# Patient Record
Sex: Female | Born: 1950 | Hispanic: No | Smoking: Never smoker
Health system: Southern US, Community
[De-identification: ages and names within clinical notes are randomized; demographics above are authoritative.]

## PROBLEM LIST (undated history)

## (undated) DIAGNOSIS — I639 Cerebral infarction, unspecified: Secondary | ICD-10-CM

## (undated) DIAGNOSIS — J45909 Unspecified asthma, uncomplicated: Secondary | ICD-10-CM

## (undated) DIAGNOSIS — J189 Pneumonia, unspecified organism: Secondary | ICD-10-CM

## (undated) DIAGNOSIS — Z992 Dependence on renal dialysis: Secondary | ICD-10-CM

## (undated) DIAGNOSIS — N189 Chronic kidney disease, unspecified: Secondary | ICD-10-CM

## (undated) DIAGNOSIS — R002 Palpitations: Secondary | ICD-10-CM

## (undated) DIAGNOSIS — E119 Type 2 diabetes mellitus without complications: Secondary | ICD-10-CM

## (undated) DIAGNOSIS — I10 Essential (primary) hypertension: Secondary | ICD-10-CM

## (undated) DIAGNOSIS — N289 Disorder of kidney and ureter, unspecified: Secondary | ICD-10-CM

## (undated) DIAGNOSIS — E7800 Pure hypercholesterolemia, unspecified: Secondary | ICD-10-CM

## (undated) DIAGNOSIS — R262 Difficulty in walking, not elsewhere classified: Secondary | ICD-10-CM

## (undated) DIAGNOSIS — K219 Gastro-esophageal reflux disease without esophagitis: Secondary | ICD-10-CM

## (undated) DIAGNOSIS — K5900 Constipation, unspecified: Secondary | ICD-10-CM

## (undated) DIAGNOSIS — I252 Old myocardial infarction: Secondary | ICD-10-CM

## (undated) DIAGNOSIS — R0602 Shortness of breath: Secondary | ICD-10-CM

## (undated) DIAGNOSIS — H269 Unspecified cataract: Secondary | ICD-10-CM

## (undated) DIAGNOSIS — R7303 Prediabetes: Secondary | ICD-10-CM

## (undated) HISTORY — DX: Prediabetes: R73.03

## (undated) HISTORY — DX: Unspecified asthma, uncomplicated: J45.909

## (undated) HISTORY — DX: Dependence on renal dialysis: Z99.2

## (undated) HISTORY — DX: Chronic kidney disease, unspecified: N18.9

## (undated) HISTORY — DX: Essential (primary) hypertension: I10

## (undated) HISTORY — DX: Palpitations: R00.2

## (undated) HISTORY — PX: AV FISTULA PLACEMENT: SHX1204

## (undated) HISTORY — PX: OTHER SURGICAL HISTORY: SHX169

## (undated) HISTORY — DX: Pneumonia, unspecified organism: J18.9

---

## 1994-02-16 ENCOUNTER — Ambulatory Visit: Admit: 1994-02-16 | Disposition: A | Discharge status: Home / Self Care | Payer: Self-pay | Admitting: Family Medicine

## 1995-03-29 ENCOUNTER — Ambulatory Visit: Admit: 1995-03-29 | Disposition: A | Discharge status: Home / Self Care | Payer: Self-pay | Admitting: Family Medicine

## 1995-10-17 ENCOUNTER — Ambulatory Visit: Admit: 1995-10-17 | Disposition: A | Discharge status: Home / Self Care | Payer: Self-pay | Admitting: Internal Medicine

## 1995-10-25 ENCOUNTER — Ambulatory Visit: Admit: 1995-10-25 | Disposition: A | Discharge status: Home / Self Care | Payer: Self-pay | Admitting: Internal Medicine

## 1996-01-24 ENCOUNTER — Ambulatory Visit: Admit: 1996-01-24 | Disposition: A | Discharge status: Home / Self Care | Payer: Self-pay | Admitting: Family Medicine

## 2003-03-16 ENCOUNTER — Ambulatory Visit
Admission: RE | Admit: 2003-03-16 | Disposition: A | Discharge status: Home / Self Care | Payer: Self-pay | Source: Ambulatory Visit | Admitting: Gastroenterology

## 2003-12-13 ENCOUNTER — Emergency Department
Admission: EM | Admit: 2003-12-13 | Disposition: A | Discharge status: Home / Self Care | Payer: Self-pay | Source: Emergency Department | Admitting: Internal Medicine

## 2005-01-20 ENCOUNTER — Ambulatory Visit (INDEPENDENT_AMBULATORY_CARE_PROVIDER_SITE_OTHER): Admit: 2005-01-20 | Disposition: A | Discharge status: Home / Self Care | Payer: Self-pay | Source: Ambulatory Visit

## 2012-09-04 DIAGNOSIS — I639 Cerebral infarction, unspecified: Secondary | ICD-10-CM

## 2012-09-04 HISTORY — DX: Cerebral infarction, unspecified: I63.9

## 2013-03-06 ENCOUNTER — Emergency Department: Payer: Self-pay

## 2013-03-06 ENCOUNTER — Emergency Department
Admission: EM | Admit: 2013-03-06 | Discharge: 2013-03-06 | Disposition: A | Discharge status: Home / Self Care | Payer: Charity | Attending: Emergency Medicine | Admitting: Emergency Medicine

## 2013-03-06 DIAGNOSIS — E119 Type 2 diabetes mellitus without complications: Secondary | ICD-10-CM | POA: Insufficient documentation

## 2013-03-06 DIAGNOSIS — Z91199 Patient's noncompliance with other medical treatment and regimen due to unspecified reason: Secondary | ICD-10-CM | POA: Insufficient documentation

## 2013-03-06 HISTORY — DX: Type 2 diabetes mellitus without complications: E11.9

## 2013-03-06 LAB — CBC AND DIFFERENTIAL
Basophils Absolute Automated: 0.03 (ref 0.00–0.20)
Basophils Automated: 0 %
Eosinophils Absolute Automated: 0.14 (ref 0.00–0.70)
Eosinophils Automated: 2 %
Hematocrit: 38.9 % (ref 37.0–47.0)
Hgb: 12.7 g/dL (ref 12.0–16.0)
Immature Granulocytes Absolute: 0.01
Immature Granulocytes: 0 %
Lymphocytes Absolute Automated: 2.71 (ref 0.50–4.40)
Lymphocytes Automated: 40 %
MCH: 21.7 pg — ABNORMAL LOW (ref 28.0–32.0)
MCHC: 32.6 g/dL (ref 32.0–36.0)
MCV: 66.5 fL — ABNORMAL LOW (ref 80.0–100.0)
MPV: 11.4 fL (ref 9.4–12.3)
Monocytes Absolute Automated: 0.42 (ref 0.00–1.20)
Monocytes: 6 %
Neutrophils Absolute: 3.39 (ref 1.80–8.10)
Neutrophils: 51 %
Platelets: 193 (ref 140–400)
RBC: 5.85 — ABNORMAL HIGH (ref 4.20–5.40)
RDW: 13 % (ref 12–15)
WBC: 6.69 (ref 3.50–10.80)

## 2013-03-06 LAB — URINALYSIS
Bilirubin, UA: NEGATIVE
Blood, UA: NEGATIVE
Glucose, UA: >=1000 — AB
Ketones UA: NEGATIVE
Leukocyte Esterase, UA: NEGATIVE
Nitrite, UA: NEGATIVE
Specific Gravity UA: >1.035 (ref 1.001–1.035)
Urine pH: 6.5 (ref 5.0–8.0)
Urobilinogen, UA: 0.2 mg/dL (ref 0.2–2.0)

## 2013-03-06 LAB — COMPREHENSIVE METABOLIC PANEL
ALT: 28 U/L (ref 0–55)
AST (SGOT): 23 U/L (ref 5–34)
Albumin/Globulin Ratio: 1.2 (ref 0.9–2.2)
Albumin: 3.9 g/dL (ref 3.5–5.0)
Alkaline Phosphatase: 130 U/L (ref 40–150)
Anion Gap: 7 (ref 5.0–15.0)
BUN: 17.3 mg/dL (ref 7.0–19.0)
Bilirubin, Total: 0.4 mg/dL (ref 0.2–1.2)
CO2: 27 (ref 22–29)
Calcium: 9.4 mg/dL (ref 8.5–10.5)
Chloride: 98 (ref 98–107)
Creatinine: 0.9 mg/dL (ref 0.6–1.0)
Globulin: 3.3 g/dL (ref 2.0–3.6)
Glucose: 467 mg/dL — ABNORMAL HIGH (ref 70–100)
Potassium: 4.1 (ref 3.5–5.1)
Protein, Total: 7.2 g/dL (ref 6.0–8.3)
Sodium: 132 — ABNORMAL LOW (ref 136–145)

## 2013-03-06 LAB — POCT GLUCOSE
Whole Blood Glucose POCT: 411 mg/dL — AB (ref 70–100)
Whole Blood Glucose POCT: 246 mg/dL — AB (ref 70–100)

## 2013-03-06 LAB — CELL MORPHOLOGY
Cell Morphology: ABNORMAL — AB
Platelet Estimate: NORMAL

## 2013-03-06 LAB — URINE MICROSCOPIC

## 2013-03-06 LAB — GFR: EGFR: >60

## 2013-03-06 LAB — CK: Creatine Kinase (CK): 84 U/L (ref 29–168)

## 2013-03-06 LAB — TROPONIN I: Troponin I: 0.01 ng/mL (ref 0.00–0.09)

## 2013-03-06 MED ORDER — METFORMIN HCL 1000 MG PO TABS
1000.0000 mg | ORAL_TABLET | Freq: Two times a day (BID) | ORAL | Status: DC
Start: 2013-03-06 — End: 2016-05-19

## 2013-03-06 MED ORDER — INSULIN REGULAR HUMAN 100 UNIT/ML IJ SOLN
8.0000 [IU] | Freq: Once | INTRAMUSCULAR | Status: AC
Start: 2013-03-06 — End: 2013-03-06
  Administered 2013-03-06: 8 [IU] via INTRAVENOUS
  Filled 2013-03-06: qty 24

## 2013-03-06 MED ORDER — SODIUM CHLORIDE 0.9 % IV BOLUS
1000.0000 mL | Freq: Once | INTRAVENOUS | Status: AC
Start: 2013-03-06 — End: 2013-03-06
  Administered 2013-03-06: 1000 mL via INTRAVENOUS

## 2013-03-06 NOTE — ED Provider Notes (Signed)
Physician/Midlevel provider first contact with patient: 03/06/13 1657         History   No chief complaint on file.    HPI Comments: Hx of NIDDM on metformin, states no recent missed dose of medication.  Saw eye doctor today, has cataracts and will need surgery.  Was sent to PCP because sugar control needs to be addressed.  Patient was then sent here because sugars high.  Patient has had several days of increased thirst, polyuria, no skin infections, no cough, no GI symptoms, no CP/SOB.  Has felt lightheaded.     The history is provided by the patient and a relative.       Past Medical History   Diagnosis Date   . Diabetes mellitus        History reviewed. No pertinent past surgical history.    No family history on file.    Social  History   Substance Use Topics   . Smoking status: Never Smoker    . Smokeless tobacco: Not on file   . Alcohol Use: No       .     No Known Allergies    Current/Home Medications    METFORMIN (GLUCOPHAGE) 500 MG TABLET    Take 500 mg by mouth 2 (two) times daily with meals.        Review of Systems   Constitutional: Negative for fever and chills.   HENT: Negative for neck pain.    Respiratory: Negative for shortness of breath.    Cardiovascular: Negative for chest pain.   Gastrointestinal: Negative for nausea, vomiting, abdominal pain and diarrhea.   Genitourinary: Negative for dysuria.   Musculoskeletal: Negative for back pain.   Skin: Negative for rash.   Neurological: Negative for syncope and headaches.   All other systems reviewed and are negative.        Physical Exam    BP 182/88  Pulse 88  Temp 97.8 F (36.6 C)  Resp 21  Ht 1.511 m  Wt 67.586 kg  BMI 29.60 kg/m2  SpO2 97%    Physical Exam   Nursing note and vitals reviewed.  Constitutional: She is oriented to person, place, and time. She appears well-developed and well-nourished. No distress.   HENT:   Head: Normocephalic and atraumatic.   Eyes: Conjunctivae normal are normal.   Neck: Neck supple. No JVD present.    Cardiovascular: Normal rate, regular rhythm and normal heart sounds.    Pulmonary/Chest: Effort normal and breath sounds normal.   Abdominal: Soft. There is no tenderness.   Musculoskeletal: Normal range of motion. She exhibits no edema and no tenderness.   Neurological: She is alert and oriented to person, place, and time.   Skin: Skin is warm and dry. She is not diaphoretic.   Psychiatric: She has a normal mood and affect. Her behavior is normal.       MDM and ED Course     ED Medication Orders      Start     Status Ordering Provider    03/06/13 1738   insulin regular (HumuLIN,NovoLIN) injection 8 Units   Once      Route: Intravenous  Ordered Dose: 8 Units         Last MAR action:  Given Jeannie Done IV    03/06/13 1701   sodium chloride 0.9 % bolus 1,000 mL   Once      Route: Intravenous  Ordered Dose: 1,000 mL  Last MAR action:  Stopped Jeannie Done IV                 MDM  Number of Diagnoses or Management Options  Hyperglycemia:   Noncompliance with medication regimen:   Diagnosis management comments: Nursing history reviewed    O2 Sat 95-100% on RA, normal    EKG interp by me:  NSR, rate 91, normal STs    Patient has empty bottle of metformin 500 which she is supposed to take 2 tabs BID, states she has been out out med for 2-3days and only taking 500mg  BID.  No clinical evidnece of DKA, infection, MI.  No indication for insulin at this time, patient instructed to take 1000mg  twice daily every day as previously directed and monitor sugars/follow up with primary care.  Stable for outpatient management.         Procedures    Clinical Impression & Disposition     Clinical Impression  Final diagnoses:   Hyperglycemia   Noncompliance with medication regimen        ED Disposition     None           New Prescriptions    METFORMIN (GLUCOPHAGE) 1000 MG TABLET    Take 1 tablet (1,000 mg total) by mouth 2 (two) times daily with meals.               Luvenia Starch, MD  03/08/13 [phone]

## 2013-03-06 NOTE — ED Notes (Addendum)
Sent by PMD for blood sugar <400 and HTN. +dizzines. No distress.PO diabetes and no med for HTN

## 2013-03-06 NOTE — Discharge Instructions (Signed)
Hyperglycemia    During your visit today, your blood sugar was found to be high.    The medical term for high blood sugar is hyperglycemia. This may be a one-time event, but it could mean that you have diabetes. Diabetes is a serious illness and if it is left untreated, it can lead to heart problems, kidney problems (including kidney failure), stroke, or blindness. It is very important that you follow up with your regular doctor for a recheck of your blood sugar.    Tell your regular doctor that your blood sugar was high today. Your doctor will want to recheck your blood and possibly order other lab tests. If your doctor finds that you have diabetes, you will need medication and a special diet to control your blood sugar.    YOU SHOULD SEEK MEDICAL ATTENTION IMMEDIATELY, EITHER HERE OR AT THE NEAREST EMERGENCY DEPARTMENT, IF ANY OF THE FOLLOWING OCCURS:   Confusion or lethargy (very sleepy and hard to wake up).   Signs of dehydration, such as decreased urination, dry mouth, extreme fatigue, lightheadedness, or fainting.   Persistent vomiting.   Fever (temperature of over 100.5 F.) or shaking chills.   Abdominal (belly) pain or vomiting.

## 2013-03-09 ENCOUNTER — Observation Stay: Payer: Charity

## 2013-03-09 ENCOUNTER — Inpatient Hospital Stay: Payer: Charity | Admitting: Internal Medicine

## 2013-03-09 ENCOUNTER — Emergency Department: Payer: Charity

## 2013-03-09 ENCOUNTER — Inpatient Hospital Stay
Admission: EM | Admit: 2013-03-09 | Discharge: 2013-03-13 | DRG: 066 | Disposition: A | Discharge status: Rehabilitation Facility | Payer: Charity | Attending: Medical Oncology | Admitting: Medical Oncology

## 2013-03-09 DIAGNOSIS — IMO0001 Reserved for inherently not codable concepts without codable children: Secondary | ICD-10-CM | POA: Diagnosis present

## 2013-03-09 DIAGNOSIS — Z880 Allergy status to penicillin: Secondary | ICD-10-CM

## 2013-03-09 DIAGNOSIS — E119 Type 2 diabetes mellitus without complications: Secondary | ICD-10-CM

## 2013-03-09 DIAGNOSIS — I10 Essential (primary) hypertension: Secondary | ICD-10-CM

## 2013-03-09 DIAGNOSIS — Z23 Encounter for immunization: Secondary | ICD-10-CM

## 2013-03-09 DIAGNOSIS — E785 Hyperlipidemia, unspecified: Secondary | ICD-10-CM | POA: Diagnosis present

## 2013-03-09 DIAGNOSIS — I6350 Cerebral infarction due to unspecified occlusion or stenosis of unspecified cerebral artery: Principal | ICD-10-CM | POA: Diagnosis present

## 2013-03-09 DIAGNOSIS — E782 Mixed hyperlipidemia: Secondary | ICD-10-CM | POA: Diagnosis present

## 2013-03-09 LAB — URINALYSIS
Bilirubin, UA: NEGATIVE
Blood, UA: NEGATIVE
Glucose, UA: 250 — AB
Ketones UA: NEGATIVE
Nitrite, UA: NEGATIVE
Protein, UR: NEGATIVE
Specific Gravity UA: 1.009 (ref 1.001–1.035)
Urine pH: 6.5 (ref 5.0–8.0)
Urobilinogen, UA: 0.2 mg/dL (ref 0.2–2.0)

## 2013-03-09 LAB — CELL MORPHOLOGY
Cell Morphology: ABNORMAL — AB
Platelet Estimate: NORMAL

## 2013-03-09 LAB — CBC AND DIFFERENTIAL
Basophils Absolute Automated: 0.03 (ref 0.00–0.20)
Basophils Automated: 0 %
Eosinophils Absolute Automated: 0.12 (ref 0.00–0.70)
Eosinophils Automated: 2 %
Hematocrit: 40.8 % (ref 37.0–47.0)
Hgb: 13.2 g/dL (ref 12.0–16.0)
Immature Granulocytes Absolute: 0.01
Immature Granulocytes: 0 %
Lymphocytes Absolute Automated: 2.31 (ref 0.50–4.40)
Lymphocytes Automated: 39 %
MCH: 21.7 pg — ABNORMAL LOW (ref 28.0–32.0)
MCHC: 32.4 g/dL (ref 32.0–36.0)
MCV: 67.2 fL — ABNORMAL LOW (ref 80.0–100.0)
MPV: 11.7 fL (ref 9.4–12.3)
Monocytes Absolute Automated: 0.34 (ref 0.00–1.20)
Monocytes: 6 %
Neutrophils Absolute: 3.08 (ref 1.80–8.10)
Neutrophils: 52 %
Platelets: 200 (ref 140–400)
RBC: 6.07 — ABNORMAL HIGH (ref 4.20–5.40)
RDW: 14 % (ref 12–15)
WBC: 5.88 (ref 3.50–10.80)

## 2013-03-09 LAB — ECG 12-LEAD
Atrial Rate: 91 {beats}/min
P Axis: 40 degrees
P-R Interval: 164 ms
Q-T Interval: 336 ms
QRS Duration: 80 ms
QTC Calculation (Bezet): 413 ms
R Axis: 7 degrees
T Axis: 25 degrees
Ventricular Rate: 91 {beats}/min

## 2013-03-09 LAB — PT/INR
PT INR: 0.9
PT: 12.5 — ABNORMAL LOW (ref 12.6–15.0)

## 2013-03-09 LAB — COMPREHENSIVE METABOLIC PANEL
ALT: 25 U/L (ref 0–55)
AST (SGOT): 24 U/L (ref 5–34)
Albumin/Globulin Ratio: 1.1 (ref 0.9–2.2)
Albumin: 3.8 g/dL (ref 3.5–5.0)
Alkaline Phosphatase: 77 U/L (ref 40–150)
Anion Gap: 11 (ref 5.0–15.0)
BUN: 9.5 mg/dL (ref 7.0–19.0)
Bilirubin, Total: 0.6 mg/dL (ref 0.2–1.2)
CO2: 24 (ref 22–29)
Calcium: 10.1 mg/dL (ref 8.5–10.5)
Chloride: 99 (ref 98–107)
Creatinine: 0.8 mg/dL (ref 0.6–1.0)
Globulin: 3.5 g/dL (ref 2.0–3.6)
Glucose: 322 mg/dL — ABNORMAL HIGH (ref 70–100)
Potassium: 4.3 (ref 3.5–5.1)
Protein, Total: 7.3 g/dL (ref 6.0–8.3)
Sodium: 134 — ABNORMAL LOW (ref 136–145)

## 2013-03-09 LAB — POCT GLUCOSE
Whole Blood Glucose POCT: 293 mg/dL — AB (ref 70–100)
Whole Blood Glucose POCT: 305 mg/dL — AB (ref 70–100)
Whole Blood Glucose POCT: 178 mg/dL — AB (ref 70–100)

## 2013-03-09 LAB — URINE MICROSCOPIC

## 2013-03-09 LAB — TROPONIN I: Troponin I: <0.01 ng/mL (ref 0.00–0.09)

## 2013-03-09 LAB — APTT: PTT: 31 (ref 23–37)

## 2013-03-09 LAB — GFR: EGFR: >60

## 2013-03-09 MED ORDER — ONDANSETRON HCL 4 MG/2ML IJ SOLN
4.00 mg | Freq: Once | INTRAMUSCULAR | Status: DC | PRN
Start: 2013-03-09 — End: 2013-03-09

## 2013-03-09 MED ORDER — ASPIRIN 81 MG PO CHEW
324.0000 mg | CHEWABLE_TABLET | Freq: Every day | ORAL | Status: DC
Start: 2013-03-10 — End: 2013-03-13
  Administered 2013-03-10 – 2013-03-13 (×4): 324 mg via ORAL
  Filled 2013-03-09 (×4): qty 4

## 2013-03-09 MED ORDER — INSULIN GLARGINE 100 UNIT/ML SC SOLN
10.00 [IU] | Freq: Every evening | SUBCUTANEOUS | Status: DC
Start: 2013-03-09 — End: 2013-03-13
  Administered 2013-03-09 – 2013-03-12 (×4): 10 [IU] via SUBCUTANEOUS
  Filled 2013-03-09 (×4): qty 100

## 2013-03-09 MED ORDER — ACETAMINOPHEN 325 MG PO TABS
650.0000 mg | ORAL_TABLET | ORAL | Status: DC | PRN
Start: 2013-03-09 — End: 2013-03-13
  Administered 2013-03-09 – 2013-03-13 (×5): 650 mg via ORAL
  Filled 2013-03-09 (×7): qty 2

## 2013-03-09 MED ORDER — LABETALOL HCL 5 MG/ML IV SOLN
20.00 mg | Freq: Three times a day (TID) | INTRAVENOUS | Status: DC | PRN
Start: 2013-03-09 — End: 2013-03-13

## 2013-03-09 MED ORDER — GLIMEPIRIDE 2 MG PO TABS
4.00 mg | ORAL_TABLET | Freq: Every morning | ORAL | Status: DC
Start: 2013-03-10 — End: 2013-03-13
  Administered 2013-03-10 – 2013-03-13 (×4): 4 mg via ORAL
  Filled 2013-03-09 (×5): qty 2

## 2013-03-09 MED ORDER — SODIUM CHLORIDE 0.9 % IV SOLN
INTRAVENOUS | Status: DC
Start: 2013-03-09 — End: 2013-03-09

## 2013-03-09 MED ORDER — MORPHINE SULFATE 2 MG/ML IJ/IV SOLN (WRAP)
2.00 mg | INTRAVENOUS | Status: DC | PRN
Start: 2013-03-09 — End: 2013-03-09

## 2013-03-09 MED ORDER — ASPIRIN 81 MG PO CHEW
324.0000 mg | CHEWABLE_TABLET | Freq: Once | ORAL | Status: AC
Start: 2013-03-09 — End: 2013-03-09
  Administered 2013-03-09: 324 mg via ORAL
  Filled 2013-03-09: qty 4

## 2013-03-09 MED ORDER — MORPHINE SULFATE 2 MG/ML IJ/IV SOLN (WRAP)
2.0000 mg | Status: DC | PRN
Start: 2013-03-09 — End: 2013-03-13
  Administered 2013-03-09 – 2013-03-12 (×4): 2 mg via INTRAVENOUS
  Filled 2013-03-09 (×4): qty 1

## 2013-03-09 MED ORDER — ACETAMINOPHEN 325 MG PO TABS
650.0000 mg | ORAL_TABLET | ORAL | Status: DC | PRN
Start: 2013-03-09 — End: 2013-03-09

## 2013-03-09 MED ORDER — SITAGLIPTIN PHOSPHATE 50 MG PO TABS
50.0000 mg | ORAL_TABLET | Freq: Every day | ORAL | Status: DC
Start: 2013-03-09 — End: 2013-03-13
  Administered 2013-03-09 – 2013-03-13 (×5): 50 mg via ORAL
  Filled 2013-03-09 (×6): qty 1

## 2013-03-09 MED ORDER — ONDANSETRON HCL 4 MG/2ML IJ SOLN
4.0000 mg | Freq: Once | INTRAMUSCULAR | Status: AC
Start: 2013-03-09 — End: 2013-03-09
  Administered 2013-03-09: 4 mg via INTRAVENOUS
  Filled 2013-03-09: qty 2

## 2013-03-09 MED ORDER — INSULIN REGULAR HUMAN 100 UNIT/ML IJ SOLN
4.0000 [IU] | Freq: Once | INTRAMUSCULAR | Status: DC
Start: 2013-03-09 — End: 2013-03-13
  Filled 2013-03-09: qty 15

## 2013-03-09 MED ORDER — INSULIN ASPART 100 UNIT/ML SC SOLN
3.0000 [IU] | Freq: Three times a day (TID) | SUBCUTANEOUS | Status: DC
Start: 2013-03-09 — End: 2013-03-13
  Administered 2013-03-09: 9 [IU] via SUBCUTANEOUS
  Administered 2013-03-10: 6 [IU] via SUBCUTANEOUS
  Administered 2013-03-11: 3 [IU] via SUBCUTANEOUS
  Administered 2013-03-11: 6 [IU] via SUBCUTANEOUS
  Administered 2013-03-11: 3 [IU] via SUBCUTANEOUS
  Administered 2013-03-12: 6 [IU] via SUBCUTANEOUS
  Administered 2013-03-13: 9 [IU] via SUBCUTANEOUS
  Filled 2013-03-09: qty 60
  Filled 2013-03-09: qty 30
  Filled 2013-03-09: qty 90
  Filled 2013-03-09: qty 30
  Filled 2013-03-09 (×2): qty 10
  Filled 2013-03-09: qty 30
  Filled 2013-03-09 (×2): qty 60

## 2013-03-09 MED ORDER — ONDANSETRON HCL 4 MG/2ML IJ SOLN
4.0000 mg | Freq: Once | INTRAMUSCULAR | Status: DC | PRN
Start: 2013-03-09 — End: 2013-03-13

## 2013-03-09 MED ORDER — PNEUMOCOCCAL VAC POLYVALENT 25 MCG/0.5ML IJ INJ
0.50 mL | INJECTION | INTRAMUSCULAR | Status: AC | PRN
Start: 2013-03-09 — End: 2013-03-12
  Administered 2013-03-12: 0.5 mL via SUBCUTANEOUS
  Filled 2013-03-09: qty 0.5

## 2013-03-09 MED ORDER — METOPROLOL TARTRATE 1 MG/ML IV SOLN
INTRAVENOUS | Status: AC
Start: 2013-03-09 — End: 2013-03-09
  Administered 2013-03-09: 5 mg
  Filled 2013-03-09: qty 15

## 2013-03-09 MED ORDER — GADOBUTROL 1 MMOL/ML IV SOLN
10.00 mL | Freq: Once | INTRAVENOUS | Status: AC | PRN
Start: 2013-03-09 — End: 2013-03-09
  Administered 2013-03-09: 10 mmol via INTRAVENOUS

## 2013-03-09 NOTE — ED Notes (Signed)
ACCUCHEK RESULTED AT 295. DOC PUCCIO INFORMED.

## 2013-03-09 NOTE — ED Provider Notes (Signed)
Physician/Midlevel provider first contact with patient: 03/09/13 1134         History     Chief Complaint   Patient presents with   . Stroke     HPI Comments: Pt was fine yesterday, walking independently, last well at about 11 pm.  Then at about 2 am she began complaining of dizziness and nausea, numbness and weakness of left side.  Family brings her in for evaluation.  Mild headache, no chest pain or shortness of breath.  Feels light headed.  Pt declines interpreter, prefers family to interpret.  Pt is not a tPA candidate due to duration of symptoms since 2 am.    Patient is a 62 y.o. female presenting with Acute Neurological Problem. The history is provided by the patient and a relative.   Cerebrovascular Accident  This is a new problem. The current episode started today (about 2 am). The problem occurs constantly. The problem has been gradually worsening. Associated symptoms include nausea and weakness. Pertinent negatives include no abdominal pain, chest pain, chills, coughing, fever, headaches, neck pain, numbness, rash, sore throat or vomiting.       Past Medical History   Diagnosis Date   . Diabetes mellitus        History reviewed. No pertinent past surgical history.    No family history on file.    Social  History   Substance Use Topics   . Smoking status: Never Smoker    . Smokeless tobacco: Not on file   . Alcohol Use: No       .     No Known Allergies    Current/Home Medications    METFORMIN (GLUCOPHAGE) 1000 MG TABLET    Take 1 tablet (1,000 mg total) by mouth 2 (two) times daily with meals.        Review of Systems   Constitutional: Negative for fever and chills.   HENT: Negative for sore throat, neck pain and neck stiffness.    Eyes: Negative for discharge and redness.   Respiratory: Negative for cough and shortness of breath.    Cardiovascular: Negative for chest pain, palpitations and leg swelling.   Gastrointestinal: Positive for nausea. Negative for vomiting, abdominal pain, diarrhea and  constipation.   Genitourinary: Negative for dysuria and frequency.   Musculoskeletal: Negative for back pain.   Skin: Negative for pallor and rash.   Neurological: Positive for dizziness, speech difficulty, weakness and light-headedness. Negative for tremors, seizures, syncope, facial asymmetry, numbness and headaches.   Psychiatric/Behavioral: Negative for agitation. The patient is nervous/anxious.    All other systems reviewed and are negative.        Physical Exam    BP 171/77  Pulse 75  Temp 97.4 F (36.3 C) (Temporal Artery)  Resp 16  Ht 1.524 m  Wt 64.3 kg  BMI 27.68 kg/m2  SpO2 98%    Physical Exam   Nursing note and vitals reviewed.  Constitutional: She appears well-developed and well-nourished. No distress.   HENT:   Head: Normocephalic and atraumatic.   Right Ear: External ear normal.   Left Ear: External ear normal.   Mouth/Throat: Oropharynx is clear and moist.   Eyes: Conjunctivae normal are normal. Pupils are equal, round, and reactive to light. Right eye exhibits no discharge. Left eye exhibits no discharge. No scleral icterus.   Neck: Normal range of motion. Neck supple. No JVD present. No tracheal deviation present. No thyromegaly present.   Cardiovascular: Normal rate, regular rhythm and normal heart  sounds.  Exam reveals no gallop and no friction rub.    No murmur heard.  Pulmonary/Chest: Effort normal and breath sounds normal. No stridor. No respiratory distress. She has no wheezes. She has no rales.   Abdominal: Soft. Bowel sounds are normal. She exhibits no distension. There is no tenderness. There is no rebound and no guarding.   Musculoskeletal: Normal range of motion. She exhibits no edema and no tenderness.        No Cyanosis or Clubbing   Lymphadenopathy:     She has no cervical adenopathy.   Neurological: She is alert. She has normal strength. No cranial nerve deficit or sensory deficit. She exhibits normal muscle tone. Coordination normal.        Subjective numbness left leg,  NIHSS 1, GCS 15   Skin: Skin is warm and dry. No rash noted. She is not diaphoretic. No erythema. No pallor.   Psychiatric: She has a normal mood and affect. Judgment normal. Her mood appears not anxious. Her speech is not slurred. She is not agitated. Cognition and memory are normal.       MDM and ED Course     ED Medication Orders      Start     Status Ordering Provider    03/09/13 1321   aspirin chewable tablet 324 mg   Once      Route: Oral  Ordered Dose: 324 mg         Last MAR action:  Given Hakim Minniefield V    03/09/13 1147   insulin regular (HumuLIN,NovoLIN) injection 4 Units   Once      Route: Intravenous  Ordered Dose: 4 Units         Last MAR action:  RN Verify Anniston Nellums V    03/09/13 1144   ondansetron (ZOFRAN) injection 4 mg   Once      Route: Intravenous  Ordered Dose: 4 mg         Last MAR action:  Given Quinne Pires V    03/09/13 1141   metoprolol (LOPRESSOR) 1 MG/ML injection      Comments: Created by cabinet override        Last MAR action:  Given Maecy Podgurski V                 MDM  Number of Diagnoses or Management Options  CVA (cerebral vascular accident):   Diagnosis management comments: I, Cleone Slim, MD, have been the primary provider for [name] during this Emergency Dept visit.  Oxygen saturation by pulse oximetry is 95%-100%, Normal.  Interventions: None Needed.  EKG Interpretation:    Rhythm:  Normal Sinus 68  Ectopy:  None  Rate:  Normal  Conduction:  No blocks  ST Segments:  Normal ST segments  T Waves:  Normal T Waves  Axis:  Normal    Q Waves:  None seen  Pacing:  Not applicable  Clinical Impression:  Normal EKG  Blood pressure improved after metoprolol, given insulin for hyperglycemia.  No history of HTN.  Initially failed dysphagia screen, but passed TOR-BSST.  PO aspirin given.  Family understands pt needs to be admitted for further evaluation, pt understands.  Results for orders placed during the hospital encounter of 03/09/13    -CBC AND DIFFERENTIAL     WBC                            5.88  Range: 3.50 - 10.80     RBC                           6.07 (*)Range: 4.20 - 5.40     Hgb g/dL                      13.2    Range: 12.0 - 16.0 g/dL     Hematocrit %                  40.8    Range: 37.0 - 47.0 %     MCV fL                        67.2 (*)Range: 80.0 - 100.0 fL     MCH pg                        21.7 (*)Range: 28.0 - 32.0 pg     MCHC g/dL                     32.4    Range: 32.0 - 36.0 g/dL     RDW %                         14      Range: 12 - 15 %     Platelets                     200     Range: 140 - 400     MPV fL                        11.7    Range: 9.4 - 12.3 fL     Neutrophils %                 52      Range: None %     Lymphocytes Automated %       39      Range: None %     Monocytes %                   6       Range: None %     Eosinophils Automated %       2       Range: None %     Basophils Automated %         0       Range: None %     Immature Granulocyte %        0       Range: None %     Neutrophils Absolute          3.08    Range: 1.80 - 8.10     Abs Lymph Automated           2.31    Range: 0.50 - 4.40     Abs Mono Automated            0.34    Range: 0.00 - 1.20     Abs Eos Automated             0.12    Range: 0.00 - 0.70     Absolute Baso Automated       0.03  Range: 0.00 - 0.20     Absolute Immature Granulocyte   0.01    Range: 0    -COMPREHENSIVE METABOLIC PANEL     Glucose mg/dL                 322 (*) Range: 70 - 100 mg/dL     BUN mg/dL                     9.5     Range: 7.0 - 19.0 mg/dL     Creatinine mg/dL              0.8     Range: 0.6 - 1.0 mg/dL     Sodium                        134 (*) Range: 136 - 145     Potassium                     4.3     Range: 3.5 - 5.1     Chloride                      99      Range: 98 - 107     CO2                           24      Range: 22 - 29     CALCIUM mg/dL                 10.1    Range: 8.5 - 10.5 mg/dL     Protein, Total g/dL           7.3     Range: 6.0 - 8.3 g/dL     Albumin g/dL                  3.8      Range: 3.5 - 5.0 g/dL     AST (SGOT) U/L                24      Range: 5 - 34 U/L     ALT U/L                       25      Range: 0 - 55 U/L     Alkaline Phosphatase U/L      77      Range: 40 - 150 U/L     Bilirubin, Total mg/dL        0.6     Range: 0.2 - 1.2 mg/dL     Globulin g/dL                 3.5     Range: 2.0 - 3.6 g/dL     Albumin/Globulin Ratio        1.1     Range: 0.9 - 2.2     Anion Gap                     11.0    Range: 5.0 - 15.0    -PT/INR     PT                            12.5 (*)  Range: 12.6 - 15.0     PT INR                        0.9     Range: None Estab.     PT Anticoag. Given Within 48 hrs.   None                  -APTT     PTT                           31      Range: 23 - 37    -URINALYSIS     Urine Type                    Clean Catch                Color, UA                     YELLOW  Range: Clear - Yellow     Clarity, UA                   CLEAR   Range: Clear - Hazy     Specific Gravity UA           1.009   Range: 1.001-1.035     Urine pH                      6.5     Range: 5.0-8.0     Leukocyte Esterase, UA        TRACE (*)Range: Negative     Nitrite, UA                   NEGATIVE Range: Negative     Protein, UA                   NEGATIVE Range: Negative     Glucose, UA                   250 (*) Range: Negative     Ketones UA                    NEGATIVE Range: Negative     Urobilinogen, UA mg/dL        0.2     Range: 0.2-2.0 mg/dL     Bilirubin, UA                 NEGATIVE Range: Negative     Blood, UA                     NEGATIVE Range: Negative    -GFR     EGFR                          >60.0                 -CELL MORPHOLOGY     Cell Morphology:              Abnormal (*)               Microcytic                    =2+ (*)  Hypochromia                   =2+ (*)                Polychromasia                 =1+ (*)                Ovalocytes                    =1+ (*)                Platelet Estimate             Normal                -POCT GLUCOSE     POCT Glucose WB mg/dL          293 (*) Range: 70 - 100 mg/dL  Results for orders placed during the hospital encounter of 03/09/13    -XR CHEST AP PORTABLE         Narrative: Clinical history: Stroke.                                    COMPARISON: None.                                    Chest, AP portable: The osseous and soft tissue structures are                  unremarkable. The thoracic aorta is tortuous. The cardiac silhouette is                  at the upper limits of normal in size. The hilar silhouettes are within                  normal limits. The lungs are grossly clear.                           Impression:  No acute process.                                      Nicholes Rough, MD                    03/09/2013 1:07 PM    -CT HEAD WO CONTRAST         Narrative: HISTORY: Blurred vision and left-sided weakness.                                    COMPARISON: None.                                    FINDINGS:     The ventricles are normal in appearance. No acute infarct                  is noted. No intracranial hemorrhage is seen. The paranasal sinuses and  mastoid air cells appear clear. Internal carotid and vertebral artery                  calcifications are visualized.                            Impression:  No acute intracranial abnormality is seen.                                                              Marcos Eke, MD                    03/09/2013 12:02 PM    Discussed with Dr. Dwaine Gale, will admit to observation unit.       Amount and/or Complexity of Data Reviewed  Clinical lab tests: ordered and reviewed  Tests in the radiology section of CPT: ordered and reviewed          Procedures    Clinical Impression & Disposition     Clinical Impression  Final diagnoses:   CVA (cerebral vascular accident)   Hypertensive emergency   Type 2 diabetes mellitus with other manifestations   Dizziness        ED Disposition     Admit Bed Type: Telemetry [5]  Admitting Physician: Art Buff [id number]  Patient Class:  Observation [104]             New Prescriptions    No medications on file               Carlota Raspberry, MD  03/09/13 [phone]

## 2013-03-09 NOTE — ED Notes (Addendum)
Numbness last night @ 3 am / dizziness  blurry vision  Unsteady on feet  No other nuero deficits  GCS-15

## 2013-03-09 NOTE — Progress Notes (Signed)
Admitted from E[facility] via stretcher alert and oriented. DAughter with her to translate. Speaks Laotian. Oriented to room and call light. Informed of plan of care and expected diagnostics eg MRI/MRA, 2D echo and blood works in AM. Verbalized understanding. Passed TOR-BSST screening done at the ER per form/report. Dysphagia screen done, passed as well. Tolerated full CC diet.

## 2013-03-09 NOTE — Progress Notes (Signed)
Per radiologist positive Acute stroke in the pons.and a right vertebral artery being small in caliber.   Being adequately treated for a CVA/ TIA.

## 2013-03-09 NOTE — H&P (Signed)
Medicine Admission H&P    Date Time: 03/09/2013 3:37 PM  Patient Name: [facility]  Attending Physician: Mir, Windle Guard, MD    Chief Complaint:    Dizziness and numbness left side with some weakness.   Balance problems     History of Present Illness:    62 yo lady admitted Hx DM admitted for the evaluation of the Left sided numbness and weakness started overnight. She was recently discharged from ER after treating for uncontrolled DM. Since then patient continues to have ongoing symptoms of dizziness and starting of the numbness of the left side. Associated with balance problems. No c/o chest pain and shortness of breath. Her DM is poorly managed and also she has been untreated HTN.     Past Medical History:      Past Medical History   Diagnosis Date   . Diabetes mellitus        Past Surgical History:    History reviewed. No pertinent past surgical history.    Family History:    No Family Hx of Cardiac or cancer history     Social History:      History     Social History   . Marital Status: Widowed     Spouse Name: N/A     Number of Children: N/A   . Years of Education: N/A     Occupational History   . Not on file.     Social History Main Topics   . Smoking status: Never Smoker    . Smokeless tobacco: Not on file   . Alcohol Use: No   . Drug Use:    . Sexually Active: Not on file     Other Topics Concern   . Not on file     Social History Narrative   . No narrative on file         Allergies:    No Known Allergies     Medications:      Prescriptions prior to admission   Medication Sig   . metFORMIN (GLUCOPHAGE) 1000 MG tablet Take 1 tablet (1,000 mg total) by mouth 2 (two) times daily with meals.       Review of Systems:    Constitutional: Negative for fever, chills and malaise/fatigue.   HENT: Negative for congestion, sore throat, neck pain and tinnitus.   Eyes: Negative for blurred vision and discharge.   Respiratory: Negative for cough, sputum production, shortness of breath and wheezing.   Cardiovascular:  Negative for chest pain, palpitations and leg swelling.   Gastrointestinal: Negative for heartburn, NV, abdominal pain, diarrhea and constipation.   Genitourinary: Negative for dysuria, urgency and frequency.   Musculoskeletal: Negative for myalgias, back pain and falls.   Skin: Negative for rash.   Neurological: ++ dizziness, ++tingling, ++sensory change, focal weakness, seizures, loss of consciousness, weakness and ++headaches.   Endo/Heme/Allergies: Does not bruise/bleed easily.   Psychiatric/Behavioral: Negative for depression, suicidal ideas, memory loss and substance abuse. The patient is not nervous/anxious.     Physical Exam:    BP 171/77  Pulse 75  Temp 97.4 F (36.3 C) (Temporal Artery)  Resp 16  Ht 1.524 m (5')  Wt 64.3 kg (141 lb 12.1 oz)  BMI 27.68 kg/m2  SpO2 98%    General appearance - alert, well appearing, and in no distress   Eyes - Conjunctiva normal, pupils equal and reactive, extraocular eye movements intact, good peripheral vision+  Mouth - mucous membranes moist, pharynx normal without lesions  Chest - clear to auscultation, no wheezes, rales or rhonchi, symmetric air entry   Heart - normal rate, regular rhythm, normal S1, S2, no murmurs, rubs, clicks or gallops   Abdomen - obese, softly distended, no rebound or guarding, scar in epigastrium   Extremities - peripheral pulses normal, no pedal edema, no clubbing or cyanosis  Musculoskeletal: Normal range of motion all extremities  Neuro: Cranial nerves intact, motor strength 5/5 all extremities. Decreased sensaation upper and lower Ext on the left side  Mental status - normal mood, behavior, speech, dress, motor activity, and thought processes     Labs:      Recent CBC   Recent Labs   Basename 03/09/13 1158    RBC 6.07*    HGB 13.2    HCT 40.8    MCV 67.2*    MCH 21.7*    MCHC 32.4    RDW 14    MPV 11.7    LABPLAT --     Recent BMP   Recent Labs   Basename 03/09/13 1158    GLU 322*    BUN 9.5    CREAT 0.8    CA 10.1    NA 134*    K 4.3     CL 99    CO2 24         Radiology:       Ct Head Wo Contrast    03/09/2013  HISTORY: Blurred vision and left-sided weakness.  COMPARISON: None.  FINDINGS:     The ventricles are normal in appearance. No acute infarct is noted. No intracranial hemorrhage is seen. The paranasal sinuses and mastoid air cells appear clear. Internal carotid and vertebral artery calcifications are visualized.       03/09/2013   No acute intracranial abnormality is seen.        Marcos Eke, MD  03/09/2013 12:02 PM     Chest Ap Portable    03/09/2013  Clinical history: Stroke.  COMPARISON: None.  Chest, AP portable: The osseous and soft tissue structures are unremarkable. The thoracic aorta is tortuous. The cardiac silhouette is at the upper limits of normal in size. The hilar silhouettes are within normal limits. The lungs are grossly clear.      03/09/2013   No acute process.  Nicholes Rough, MD  03/09/2013 1:07 PM      Assessment:       1. New onset paresthesia/ dizziness  R/o CVA  2. HTN urgency   3. DM uncontrolled     Plan:     Admit patient to OBS  Monitor HTN closely  No blood pressure Meds until >210 SBP or >120 DBP for next 24 hrs   Labetalol if high BP  Optimize DM management   HB a1c, novolog sliding scale high dose   Check Fasting lipids   Check Echo   MRI and MRA of brain and Echo   DVT px SCD and lovenox   Pain meds as needed   D/w Daughter at the bedside   Physical therapy       Shelia Media MD [place]   Pager Number: [phone]

## 2013-03-10 ENCOUNTER — Observation Stay: Payer: Charity

## 2013-03-10 LAB — LIPID PANEL
Cholesterol / HDL Ratio: 4.3
Cholesterol: 245 mg/dL — ABNORMAL HIGH (ref 0–199)
HDL: 57 mg/dL (ref >40)
LDL Calculated: 166 mg/dL — ABNORMAL HIGH (ref 0–99)
Triglycerides: 112 mg/dL (ref 34–149)
VLDL Calculated: 22 mg/dL (ref 10–40)

## 2013-03-10 LAB — BASIC METABOLIC PANEL
Anion Gap: 9 (ref 5.0–15.0)
BUN: 10.9 mg/dL (ref 7.0–19.0)
CO2: 25 (ref 22–29)
Calcium: 9.6 mg/dL (ref 8.5–10.5)
Chloride: 104 (ref 98–107)
Creatinine: 0.7 mg/dL (ref 0.6–1.0)
Glucose: 207 mg/dL — ABNORMAL HIGH (ref 70–100)
Potassium: 4.1 (ref 3.5–5.1)
Sodium: 138 (ref 136–145)

## 2013-03-10 LAB — ECG 12-LEAD
Atrial Rate: 68 {beats}/min
P Axis: 1 degrees
P-R Interval: 164 ms
Q-T Interval: 374 ms
QRS Duration: 78 ms
QTC Calculation (Bezet): 397 ms
R Axis: 8 degrees
T Axis: 33 degrees
Ventricular Rate: 68 {beats}/min

## 2013-03-10 LAB — POCT GLUCOSE
Whole Blood Glucose POCT: 218 mg/dL — AB (ref 70–100)
Whole Blood Glucose POCT: 280 mg/dL — AB (ref 70–100)
Whole Blood Glucose POCT: 128 mg/dL — AB (ref 70–100)
Whole Blood Glucose POCT: 216 mg/dL — AB (ref 70–100)

## 2013-03-10 LAB — GFR: EGFR: >60

## 2013-03-10 LAB — CBC
Hematocrit: 38.4 % (ref 37.0–47.0)
Hgb: 12.4 g/dL (ref 12.0–16.0)
MCH: 21.6 pg — ABNORMAL LOW (ref 28.0–32.0)
MCHC: 32.3 g/dL (ref 32.0–36.0)
MCV: 67 fL — ABNORMAL LOW (ref 80.0–100.0)
MPV: 11.1 fL (ref 9.4–12.3)
Platelets: 190 (ref 140–400)
RBC: 5.73 — ABNORMAL HIGH (ref 4.20–5.40)
RDW: 14 % (ref 12–15)
WBC: 6.39 (ref 3.50–10.80)

## 2013-03-10 LAB — TROPONIN I: Troponin I: <0.01 ng/mL (ref 0.00–0.09)

## 2013-03-10 LAB — HEMOLYSIS INDEX: Hemolysis Index: 2 (ref 0–9)

## 2013-03-10 MED ORDER — LISINOPRIL 5 MG PO TABS
5.0000 mg | ORAL_TABLET | Freq: Every day | ORAL | Status: DC
Start: 2013-03-10 — End: 2013-03-13
  Administered 2013-03-10 – 2013-03-13 (×4): 5 mg via ORAL
  Filled 2013-03-10 (×4): qty 1

## 2013-03-10 MED ORDER — PRAVASTATIN SODIUM 20 MG PO TABS
20.00 mg | ORAL_TABLET | Freq: Every evening | ORAL | Status: DC
Start: 2013-03-10 — End: 2013-03-13
  Administered 2013-03-10 – 2013-03-12 (×3): 20 mg via ORAL
  Filled 2013-03-10 (×5): qty 1

## 2013-03-10 MED ORDER — ENOXAPARIN SODIUM 40 MG/0.4ML SC SOLN
40.0000 mg | Freq: Every day | SUBCUTANEOUS | Status: DC
Start: 2013-03-10 — End: 2013-03-13
  Administered 2013-03-10 – 2013-03-13 (×4): 40 mg via SUBCUTANEOUS
  Filled 2013-03-10 (×4): qty 0.4

## 2013-03-10 NOTE — PT Eval Note (Signed)
[facility]  [place], [place]    Department of Rehabilitation  [phone]    Physical Therapy Evaluation    Patient: Jacqueline Weber    MRN#: [id number]     Time of treatment: Time Calculation  PT Received On: 03/10/13  Start Time: 0903  Stop Time: 0920  Time Calculation (min): 17 min    PT Visit Number: 1    Consult received for Jacqueline Weber for PT Evaluation and Treatment.  Patient's medical condition is appropriate for Physical therapy intervention at this time.    Precautions and Contraindications: Fall risk       Medical Diagnosis: Dizziness [780.4]  CVA (cerebral vascular accident) [434.91]  Type 2 diabetes mellitus with other manifestations [250.80]  Hypertensive emergency [401.9]  [id number] (transient ischemic [phone] (transient ischemic [phone]  [id number] CVA (cerebral vascular accident)[id number]    History of Present Illness: Jacqueline Weber is a 62 y.o. female admitted on 03/09/2013 with "left sided numbness and weakness started overnight. She was recently discharged from ER after treating for uncontrolled DM. Since then patient continues to have ongoing symptoms of dizziness and starting of the numbness of the left side. Associated with balance problems. No c/o chest pain and shortness of breath. Her DM is poorly managed and also she has been untreated HTN" per H&P.      Patient Active Problem List   Diagnosis   . R/o CVA   . Type 2 diabetes mellitus with other manifestations   . Hypertensive emergency   . HTN (hypertension) urgency   . CVA (cerebral vascular accident)        Past Medical/Surgical History:  Past Medical History   Diagnosis Date   . Diabetes mellitus       History reviewed. No pertinent past surgical history.      X-Rays/Tests/Labs:  MRI Brain W WO Contrast [[id number]] (Order [id number])    Status:  Final result         Study Result      History: Stroke.     FINDINGS: Brain MRI without and following administration of  intravenous  contrast. Correlation with a brain CT performed earlier the same day.     There is an approximately 9 x 8 mm focus of diffusion restriction  consistent with an acute infarction involving the anterior aspect of the  pons on the right side. Small foci of T2 prolongation are seen scattered  in the supratentorial white matter consistent with mild-to-moderate  chronic small vessel ischemic disease. There is no mass effect, MR  evidence of an acute intracranial hemorrhage. The ventricular system and  cisterns are normally configured. There is no abnormal enhancement. The  major vascular flow voids are normally maintained. 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     IMPRESSION:       1. Acute infarction anteriorly on the right in the pons.  2. Mild to moderate chronic small vessel ischemic disease     Social History:  Prior Level of Function  Prior level of function: Ambulates / Performs ADL's independently  Baseline Activity Level: Community ambulation;Household ambulation  DME Currently at Home: Single point cane  Home Living Arrangements  Living Arrangements: Children (Someone is always home with pt)  Type of Home: House  Home Layout: Multi-level;Bed/bath upstairs (1 STE)  Bathroom Shower/Tub: Tub/shower unit  DME Currently at Home: Single point cane  Additional Comments: (+)meal prep, blurry vision R eye    Subjective: Patient  is agreeable to participation in the therapy session. Nursing clears patient for therapy. No c/o pain currently. Reports having blurry vision R eye. Daughters present throughout session to assist with translation as needed per pt and family preference.  Patient Goal  Patient Goal: To return home       Objective:  Observation of Patient/Vital Signs:  Patient is in bed with telemetry in place.    Cognition  Arousal/Alertness: Appropriate responses to stimuli  Attention Span: Appears intact  Memory: Appears intact  Following Commands: Follows one step  commands without difficulty  Safety Awareness: minimal verbal instruction  Insights: Fully aware of deficits  Neuro Status  Behavior: attentive;calm cooperative  Motor Planning: intact  Coordination: [place] impaired (L UE with serial opposition, RAM and finger to nose)    Intact light touch BLE    Gross ROM  Right Lower Extremity ROM: within functional limits  Left Lower Extremity ROM: within functional limits  Gross Strength  Right Lower Extremity Strength:  (4+/5)  Left Lower Extremity Strength: needs focused assessment (4+/5 except)  L Hip Flexion: 4-/5  L Knee Flexion: 4/5       Functional Mobility  Supine to Sit: Stand by assistance (HOB elevated)  Sit to Supine: Modified independent (Device)  Sit to Stand: Stand by assistance (for safety)  Stand to Sit: Stand by assistance     Locomotion  Ambulation: stand by assistance;minimal assistance (Handheld assistance at times)  Ambulation Distance (Feet): 100 Feet  Pattern: decreased cadence (Mild path deviation, few minor LOB without assistance to correct)     Balance  Balance: needs focused assessment  Sitting - Static: Good  Sitting - Dynamic: Good  Standing - Static: Fair  Standing - Dynamic: Fair    Participation and Endurance  Participation Effort: good  Endurance: Tolerates 10 - 20 min exercise with multiple rests. C/o dizziness with supine to sit and ambulation, ?due to blurry vision R eye. No SOB noted.    Treatment Activities: Educated in and demonstrated AP, LAQ and seated marching to perform throughout the day to decrease effects of immobility. Instructed to have supervision at home during mobility, especially stair negotiation and shower transfers, for safety. Encouraged to obtain seat for shower due to continued c/o dizziness. Educated on OP PT for follow up as needed for balance and coordination retraining. Pt and family verbalized understanding for all teaching provided.    Educated the patient to role of physical therapy, plan of care, goals of therapy  and safety with mobility and ADLs, home safety. Instructed not to get up without assistance for safety.    At end of session pt seated upright in bed, call bell and items in reach. Family present. RN aware.    Assessment: Jacqueline Weber is a 62 y.o. female admitted 03/09/2013 presenting with R pons CVA.     Impairments: Assessment: Decreased LE strength;Decreased functional mobility;Visual deficit.     Therapy Diagnosis: Decreased functional mobility, unsteady gait    Rehabilitation Potential: Prognosis: Good;With continued PT status post acute discharge    Plan: Treatment/Interventions: Gait training;Neuromuscular re-education;Functional transfer training;LE strengthening/ROM;Endurance training PT Frequency: 4-5x/wk    Risks/Benefits/POC Discussed with Pt/Family: With patient/family       Goals:   Goals  Goal Formulation: With patient/family  Time for Goal Acheivement: By time of discharge  Goals: Select goal  Pt Will Stand: 3-5 min;With stand by assist;to maximize functional mobility and independence;3 visits (without LOB)  Pt Will Ambulate: 101-150 feet;with single point cane;With stand by  assist;to maximize functional mobility and independence;3 visits  Pt Will Go Up / Down Stairs: 6-10 stairs;With stand by assist;to maximize functional mobility and independence;3 visits    Discharge Recommendation: Home with supervision;Home with outpatient PT         Marina Gravel, PT, DPT  Pager #: [phone]

## 2013-03-10 NOTE — Progress Notes (Signed)
Hospitalist Medicine Progress Note    Date Time: 03/10/2013  4:10 PM  Patient Name:Jacqueline Weber    CC / HPI   New CVA    Subjective    Patient seen and examined. No c/o chest pain or shortness of breath.   Left sided weakness / numbness slightly better.     Physical Exam:   BP 151/79  Pulse 99  Temp 97.6 F (36.4 C) (Temporal Artery)  Resp 18  Ht 1.524 m (5')  Wt 66.4 kg (146 lb 6.2 oz)  BMI 28.59 kg/m2  SpO2 95%      Intake/Output Summary (Last 24 hours) at 03/10/13 1610  Last data filed at 03/10/13 0930   Gross per 24 hour   Intake    480 ml   Output      0 ml   Net    480 ml       General appearance - alert, well appearing, and in no distress   Mouth - mucous membranes moist    Chest - clear to auscultation, normal respiratory effort,  no wheezes, rales or rhonchi  Heart - normal rate, regular rhythm, normal S1, S2, no murmurs, rubs, clicks or gallops   Abdomen - non distended, no rebound or guarding, normal bowel sounds   Extremities - Moves all extremities  Skin: warm to touch, no rashes    Mental status - normal mood, normal behavior      labs     Recent Labs   Basename 03/10/13 0405 03/09/13 1158    WBC 6.39 5.88    HGB 12.4 13.2    HCT 38.4 40.8    PLT 190 200    PT -- 12.5*    INR -- 0.9    PTT -- 31    ALB -- 3.8    CA 9.6 10.1    LIP -- --    AMY -- --      Recent Labs   [facility] 03/10/13 0405 03/09/13 1158    NA 138 134*    K 4.1 4.3    CL 104 99    CO2 25 24    BUN 10.9 9.5    CREAT 0.7 0.8    GLU 207* 322*    ALT -- 25    AST -- 24    ALKPHOS -- 77    BILITOTAL -- 0.6                 EKG         Radiology      Radiology Results (24 Hour)     Procedure Component Value Units Date/Time    MRI Brain W WO Contrast [id number] Collected:03/09/13 2309    Order Status:Completed  Updated:03/09/13 2326    Narrative:    History: Stroke.    FINDINGS: Brain MRI without and following administration of intravenous  contrast. Correlation with a brain CT performed earlier the same day.    There is an  approximately 9 x 8 mm focus of diffusion restriction  consistent with an acute infarction involving the anterior aspect of the  pons on the right side. Small foci of T2 prolongation are seen scattered  in the supratentorial white matter consistent with mild-to-moderate  chronic small vessel ischemic disease. There is no mass effect, MR  evidence of an acute intracranial hemorrhage. The ventricular system and  cisterns are normally configured. There is no abnormal enhancement. The  major vascular flow voids are normally maintained. Note is  made of a  congenitally hypoplastic right vertebral artery.      Impression:      1. Acute infarction anteriorly on the right in the pons.  2. Mild to moderate chronic small vessel ischemic disease.    Urgent results were discussed with the hospitalist caring for the  patient on 03/09/2013 11:21 PM.    Dianne Dun, MD   03/09/2013 11:22 PM    MRA Neck W WO Contrast [id number] Collected:03/09/13 2312    Order Status:Completed  Updated:03/09/13 2326    Narrative:    History: Stroke.    Findings: MRA of the neck without and following administration of 10 cc  of Gadavist, unenhanced MRA of the head. Determination of the proximal  internal carotid artery narrowing was performed utilizing distal  internal carotid artery diameter as a denominator. Correlation with a  brain MRI performed at the same time.    There is minimal irregularity involving the region of the right common  carotid artery bifurcation consistent with minor atherosclerotic plaque.  This results in minor, approximately 10% narrowing of the proximal right  internal carotid artery. There is mild narrowing of the supraclinoid  right internal carotid artery. This vessel is congenitally hypoplastic  as the right A1 segment is hypoplastic.    The left common carotid artery, the cervical and intracranial left  internal carotid arteries are normal appearing.    The left vertebral artery is a large caliber dominant vessel  which is  normal appearing. The right vertebral artery appears very small in  caliber and demonstrates segments of decreased signal consistent with  slow flow. Segmental occlusions are not entirely excluded. This vessel  is likely congenitally small in caliber based on its appearance on the  concurrently performed brain MRI. The basilar artery is normal in  caliber. The proximal visualized portions of the anterior, middle and  posterior cerebral arteries are unremarkable.      Impression:      1. Minor atherosclerotic changes in the proximal and distal right  internal carotid artery.  2. 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 It  demonstrates segments of decreased flow-related signal. This is  consistent with slow flow and a very small caliber of the vessel. Small  segmental occlusions are not excluded.    Urgent results were discussed with the hospitalist caring for the  patient on 03/09/2013 11:21 PM.    Dianne Dun, MD   03/09/2013 11:22 PM    MRA Head WO Contrast [id number] Collected:03/09/13 2312    Order Status:Completed  Updated:03/09/13 2326    Narrative:    History: Stroke.    Findings: MRA of the neck without and following administration of 10 cc  of Gadavist, unenhanced MRA of the head. Determination of the proximal  internal carotid artery narrowing was performed utilizing distal  internal carotid artery diameter as a denominator. Correlation with a  brain MRI performed at the same time.    There is minimal irregularity involving the region of the right common  carotid artery bifurcation consistent with minor atherosclerotic plaque.  This results in minor, approximately 10% narrowing of the proximal right  internal carotid artery. There is mild narrowing of the supraclinoid  right internal carotid artery. This vessel is congenitally hypoplastic  as the right A1 segment is hypoplastic.    The left common carotid artery, the cervical and intracranial left  internal carotid  arteries are normal appearing.    The left vertebral artery is a  large caliber dominant vessel which is  normal appearing. The right vertebral artery appears very small in  caliber and demonstrates segments of decreased signal consistent with  slow flow. Segmental occlusions are not entirely excluded. This vessel  is likely congenitally small in caliber based on its appearance on the  concurrently performed brain MRI. The basilar artery is normal in  caliber. The proximal visualized portions of the anterior, middle and  posterior cerebral arteries are unremarkable.      Impression:      1. Minor atherosclerotic changes in the proximal and distal right  internal carotid artery.  2. 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 It  demonstrates segments of decreased flow-related signal. This is  consistent with slow flow and a very small caliber of the vessel. Small  segmental occlusions are not excluded.    Urgent results were discussed with the hospitalist caring for the  patient on 03/09/2013 11:21 PM.    Dianne Dun, MD   03/09/2013 11:22 PM             Medications     Current Facility-Administered Medications   Medication Dose Route Frequency   . aspirin  324 mg Oral Daily   . enoxaparin  40 mg Subcutaneous Daily   . glimepiride  4 mg Oral QAM W/BREAKFAST   . insulin aspart  3-12 Units Subcutaneous TID AC   . insulin glargine  10 Units Subcutaneous QHS   . insulin regular  4 Units Intravenous Once   . pravastatin  20 mg Oral QHS   . sitaGLIPtin  50 mg Oral Daily        acetaminophen, [COMPLETED] gadobutrol, labetalol, morphine, ondansetron, pneumococcal vaccine-23        Assessment   1. Acute CVA  2. DM uncontrolled   3. HTN uncontrolled   4. Dyslipidemia      Plan   1. Change to inpatient for close monitoring for CVA  2. BP management   3. Continue ASA and statin   4. DM optimization Glimeperide/Januvia/ lantus for now   5. D/w Dr. Chestine Spore   6. Physical therapy   7. Monitor vitals and lytes    8. DVT px       Disposition   Home     Time spent for evaluation, management and coordination of care:    25 min     Type of Admission:    Inpatient     Reason for extended stay:    New CVA        Larrie Kass Chella Chapdelaine MD [facility] Hospitalist   Pager Number: [phone]

## 2013-03-10 NOTE — Progress Notes (Signed)
Repeated Tor-Bsst test Pt no difficulty swallow or drooling noted.

## 2013-03-10 NOTE — Consults (Signed)
Service Date: 03/10/2013     Patient Type: I     CONSULTING PHYSICIAN: Orpah Clinton MD     REFERRING PHYSICIAN: Larrie Kass Manchireddy MD     CHIEF COMPLAINT:    Left-sided weakness.     HISTORY OF PRESENT ILLNESS:  Ms. Jacqueline Weber is a 62 year old female with a history of diabetes who  was admitted yesterday after presenting with complaints of left-sided  numbness and weakness.  The patient herself speaks no [place] and the  patient's daughter interprets and provides additional history.  The patient  was recently diagnosed with diabetes at the beginning of July and was  placed on metformin.  She was in her usual state of health before she  developed left-sided numbness and weakness.  She also has been having a few  mild headaches over the last several days.  She reported no visual changes.   She denied any dizziness or vertigo.  There were no problems with her  speech or swallowing.  There was no chest pain or shortness of breath.  The  patient presented to the emergency department.  Her blood pressure was  171/77.  She had a noncontrast head CT, which was unremarkable.  The  patient was admitted for further evaluation and treatment.  Since admission  to the hospital, the patient has remained stable.  The patient reports that  her symptoms have improved, but she still has not returned back to  baseline.  She denies any similar symptoms in the past.  She denies any  previous history of stroke or TIA.  She denies any other recent illnesses.   She denies any fever, chills, cough, sore throat, chest pain, shortness of  breath, or abdominal pain.     PAST MEDICAL HISTORY:  Diabetes.     PAST SURGICAL HISTORY:  None reported.     MEDICATIONS AS AN OUTPATIENT:  Metformin.     ALLERGIES:  PENICILLIN.     FAMILY HISTORY:  Noncontributory.     SOCIAL HISTORY:  Denies tobacco or alcohol use.  She is a widow.  She currently does not  work.     REVIEW OF SYSTEMS:  NEUROLOGIC:  As per HPI.  GENERAL/CONSTITUTIONAL:   Negative.  HEAD:  As per HPI.  EYES:  Negative.   EARS:  Negative.   THROAT:  Negative:    CARDIAC:  Negative.    RESPIRATORY:  Negative.    GI:  Negative.  GU:  Negative.  ENDOCRINE:  Negative.  RHEUMATOLOGIC:  Negative.  MUSCULOSKELETAL:  Negative.  PSYCHIATRIC:  Negative.     PHYSICAL EXAMINATION:  VITAL SIGNS:  Blood pressure 159/88, pulse 99, respirations 18, temperature  97.6.  GENERAL:  Well-developed, well-nourished female in no acute distress.  HEENT:  Normocephalic, atraumatic.  Oropharynx clear.  NECK:  Supple.  There are no carotid bruits.  CARDIOVASCULAR:  Regular rate and rhythm.  EXTREMITIES:  No cyanosis, clubbing, or edema.  NEUROLOGIC:  The patient is alert.  Per her family, she is oriented x3.   Speech is fluent.  The patient follows commands.  The pupils are reactive  to light.  Visual fields are full to confrontation.  Extraocular movements  are intact.  There is normal sensation on the face.  The face is symmetric.   The palate goes up symmetrically.  Hearing is intact.  The  sternocleidomastoids are of equal strength.  The tongue is midline.  Motor  examination reveals normal tone throughout.  There is a mild left  pronator  drift.  Strength is 5 over 5 on the right side.  Strength is 5- over 5 on  the left side.  Sensory exam is intact to light touch.  Coordination  testing is normal on the right.  Gait examination deferred.  DTRs are  symmetric.  The toes are equivocal bilaterally.     DIAGNOSTIC STUDIES:  The patient had an MRI of the brain, which was notable for an acute infarct  in the right pons.  Also noted was mild-to-moderate chronic small vessel  ischemic disease.  MRA of the head and neck was notable for minor  atherosclerotic changes in the proximal and distal right internal carotid  artery.  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     LABORATORY AND DIAGNOSTIC DATA:     Total cholesterol 245, HDL cholesterol 57, LDL cholesterol 166.  BMP  significant for  glucose of 207.  CBC notable for an MCV of 67.0.     IMPRESSION:  1.  Cerebrovascular accident.  The patient has multiple risk factors.  She  likely has had undiagnosed hypertension and hyperlipidemia.  She also  recently was diagnosed with diabetes which has been poorly controlled.  2.  Diabetes.  3.  Probable hypertension.  4.  Probable hyperlipidemia.     RECOMMENDATIONS:  1.  Echocardiogram has been done, the results of which are pending.  2.  Continue aspirin.  3.  Physical therapy/occupational therapy  consults.  4.  Further recommendations will depend upon the patient's clinical course.           D:  03/10/2013 12:45 PM by Dr. Orpah Clinton, MD ([id number])  T:  03/10/2013 15:01 PM by Lupita Leash      ([id number]) (Doc [id number])

## 2013-03-10 NOTE — SLP Eval Note (Signed)
[facility]  [place], [place]  [phone]    Speech and Language Therapy Bedside Swallow Evaluation    Patient: Jacqueline Weber    MRN#: [id number]     Time of Treatment: SLP Received On: 03/10/13  Start Time: 0930  Stop Time: 0950  Time Calculation (min): 20 min         Consult received for Jacqueline Weber for SLP Bedside Swallow Evaluation and Treatment.    Medical Diagnosis: Dizziness [780.4]  CVA (cerebral vascular accident) [434.91]  Type 2 diabetes mellitus with other manifestations [250.80]  Hypertensive emergency [401.9]  [id number] (transient ischemic [phone] (transient ischemic [phone]  [id number] CVA (cerebral vascular accident)[id number]    History of Present Illness: Jacqueline Weber is a 62 y.o. female admitted on 03/09/2013  for the evaluation of the Left sided numbness and weakness started overnight. She was recently discharged from ER after treating for uncontrolled DM. Since then patient continues to have ongoing symptoms of dizziness and starting of the numbness of the left side. Associated with balance problems. No c/o chest pain and shortness of breath. Her DM is poorly managed and also she has been untreated HTN.      CXR- No acute process.   MRI  1. Acute infarction anteriorly on the right in the pons.  2. Mild to moderate chronic small vessel ischemic disease.        Patient Active Problem List   Diagnosis   . R/o CVA   . Type 2 diabetes mellitus with other manifestations   . Hypertensive emergency   . HTN (hypertension) urgency   . CVA (cerebral vascular accident)        Past Medical/Surgical History:  Past Medical History   Diagnosis Date   . Diabetes mellitus       History reviewed. No pertinent past surgical history.      History/Current Status:  History/Current Status  Respiratory Status: room air  Behavior/Mental Status: Alert;Able to follow directions;Cooperative;Pleasant mood  Nutrition:  oral  Diet Prior to Study: regular;thin liquids    Subjective: Patient is agreeable to participation in the therapy session. Patient's medical condition is appropriate for Speech therapy intervention at this time.Family present reporting that pt appears near baseline regarding language and cognition but that her 'speech is somewhat slurred'.Daughter present to act as interpretor and provide information re: baseline status ('active, independent)    Objective:  Observation of Patient/Vital Signs:  Patient is in bed with peripheral IV in place.    Oral Motor Skills  Oral Motor Skills: exceptions to [facility]  Oral Motor Impairments: left paresis;strength;dysarthria (very mild L sided facial weakness; mild dysarthria)    Deglutition Skills  Position: upright 90 degrees  Food(s) Tested: ice chips;thin liquid;puree;solid;meal  Oral Stage: adequate; slightly delayed but complete AP transit of both solids and liquids; pt also with adequate mastication of solids; no oral residual with either solids or liquids; no anterior bolus loss  Pharyngeal Stage: adequate; Timely and coordinated swallow response for both liquids and solids; pt with adequate laryngeal elevation; clear breath sounds and vocal quality pre and post swallow, to cervical auscultation; no cough or choke with solids or liquids; vitals remained unchanged  Esophageal Stage:  (not assessed)    Assessment:   1. WFL oropharyngeal phase skills  2. Mild dysarthria; family reported that pt is at baseline re: cognition, language skills    Goals:  TBD next session - SLP to see  1-2 additional sessions to ensure that pt truly is at/near baseline relative to speech and language    Plan/Recommendations:  begin/continue oral diet;patient/family education     Duration of Treatment:  (will follow for 2-3 sessions)  Diet Solids Recommendation: regular (thin liquids)     Precautions/Compensations: Awake/alert;Upright 90 degrees for all oral intake;45 degrees upright after  meals  Recommendation Discussed With: : Patient;Caregiver;Nurse  Recommended Form of Meds: Whole;With liquid    Aspiration Precautions posted at bedside.    Jacqueline Weber. Jacqueline Weber, Jacqueline Weber

## 2013-03-10 NOTE — Plan of Care (Signed)
Pt transfer to room 221A  From observation unit accompanied by daughters. Oriented to room and staff. Call light with in reach. Pt and family verbalized understanding.

## 2013-03-10 NOTE — Consults (Signed)
[facility] Case Management Initial Discharge Planning Assessment     Psychosocial/Demographic information   Who was interviewed, relationship, best contact information - Patient and 2 daughters, Jacqueline Weber & Jacqueline Weber  Cognitive status: a&ox3 and able to make decisions.  Pnt speaks [place] but understands English  Pt lives with:  youngest daughter, Jacqueline Weber, 3 grandchildren (61, 16. 22) and daughter's BF  Type of residence: 3 level townhome w no steps, main level w kitchen is on bottom level and pnt's bedroom is on second level which is 20 steps to that level.    Prior level of functioning:  independent in all spheres   [employer] and Support system: good family support system w her 3 daughters  Insurance status, co-pays, medication coverage: None   Any additional emergency contacts? Jacqueline Weber, daughter [phone] or Jacqueline Weber, daughter [phone]     DME, SNF, [place] [facility]   DME currently at home: No  Has the patient been to a SNF in the past? If so, where?: No   Any home care companies - No   Palliative care or hospice involvement - No         Advanced directives on the chart? No   Healthcare Decision Maker and relationship to patient Jacqueline Weber, oldest daughter [phone]  PCP - [facility]  TCM referral needed? Yes for DM, referral made via [facility]  D/C Clinic appointment needed?  Date, time and Location-   CM offered PCP follow up appointment setup within 72 hrs of discharge.   Appointment Date and time-  daughter will set up on own      Discharge Needs: Home w supervision and outpnt PT      Potential Barriers to Discharge: no insurance      Discussed Anticipated Discharge Date and Discharge Disposition Possibilities with: Pnt and daughters aware of d/c in 1-2 and agreeable.  Pnt will return home with her youngest daughter who stays at home and is with pnt 24/7.    Pt goals/preferences: to return home     Anticipation of care needs increasing or decreasing over time? Decreasing

## 2013-03-10 NOTE — OT Eval Note (Signed)
[facility]  [place]  [phone]    Occupational Therapy Evaluation    Patient: Jacqueline Weber    MRN#: [id number]     Time of treatment: Time Calculation  OT Received On: 03/10/13  Start Time: 1005  Stop Time: 1025  Time Calculation (min): 20 min  OT Visit Number: 1    Consult received for Jacqueline Weber for OT Evaluation and Treatment.  Patient's medical condition is appropriate for Occupational therapy intervention at this time.    Precautions and Contraindications: None         Medical Diagnosis: Dizziness [780.4]  CVA (cerebral vascular accident) [434.91]  Type 2 diabetes mellitus with other manifestations [250.80]  Hypertensive emergency [401.9]  [id number] (transient ischemic [phone]  [id number] (transient ischemic [phone]  [id number] CVA (cerebral vascular accident)[id number]    History of Present Illness: Jacqueline Weber is a 62 y.o. female admitted on 03/09/2013 with Left sided numbness and weakness started overnight. She was recently discharged from ER after treating for uncontrolled DM. Since then patient continues to have ongoing symptoms of dizziness and starting of the numbness of the left side. Associated with balance problems. No c/o chest pain and shortness of breath. Her DM is poorly managed and also she has been untreated HTN.       Patient Active Problem List   Diagnosis   . R/o CVA   . Type 2 diabetes mellitus with other manifestations   . Hypertensive emergency   . HTN (hypertension) urgency   . CVA (cerebral vascular accident)        Past Medical/Surgical History:  Past Medical History   Diagnosis Date   . Diabetes mellitus       History reviewed. No pertinent past surgical history.      X-Rays/Tests/Labs:  Acute infarction anteriorly on the right in the pons. Per MRI      Social History:  Prior Level of Function  Prior level of function: Ambulates / Performs ADL's independently  Baseline Activity Level:  Community ambulation;Household ambulation  DME Currently at Home: Single point cane  Home Living Arrangements  Living Arrangements: Children (Someone is always home with pt)  Type of Home: House  Home Layout: Multi-level;Bed/bath upstairs (1 STE)  Bathroom Shower/Tub: Tub/shower unit  DME Currently at Home: Single point cane  Additional Comments: (+)meal prep, blurry vision R eye    Subjective: Family and/or guardian are agreeable to patient's participation in the therapy session.  Subjective: Family at bedside for translation per patient request. States she is doing ok.   Pain Assessment  Pain Assessment: No/denies pain.        Objective:  Observation of Patient/Vital Signs:  Patient is in bed with telemetry in place.         Cognition  Arousal/Alertness: Appropriate responses to stimuli  Attention Span: Appears intact  Orientation Level: Oriented X4  Memory: Appears intact  Following Commands: independent  Safety Awareness: independent  Insights: Fully aware of deficits  Problem Solving: Able to problem solve independently  Neuro Status  Behavior: calm cooperative  Motor Planning: intact  Coordination: Platea impaired (L hand )    Gross ROM  Right Upper Extremity ROM: within functional limits  Left Upper Extremity ROM: within functional limits  Gross Strength  Right Upper Extremity Strength: within functional limits  Left Upper Extremity Strength: within functional limits  Right Lower Extremity Strength:  (4+/5)  Left Lower Extremity Strength: needs focused assessment (  4+/5 except)     Tone: within functional limits    Sensory  Auditory: intact  Tactile - Light Touch: impaired left  Visual Acuity: impaired (blurred vision in R eye )       Self-care and Home Management  Eating: independently  Grooming: independently  Toileting: independently    Mobility and Transfers  Rolling: Independent  Supine to Sit: Independently  Functional Mobility/Ambulation: Independently     Balance  Static Sitting Balance: normal  Dyanamic  Sitting Balance: normal    Participation and Endurance  Participation Effort: good    Treatment Activities: Educated patient in [facility] exercises for L hand including finger opposition, lateral pinch, and gross grasp. Educated patient in the importance of functional usage of L hand to assist with daily tasks to increase brain awareness. Patient demonstrated independence with one set of 5 of L hand exercises    Educated the patient to role of occupational therapy, plan of care, goals of therapy and safety with mobility and ADLs.    Assessment: [name] is a 62 y.o. female admitted 03/09/2013 presenting with CVA.  Impairments: Assessment:  (Appears to be at baseline for ADL's )    Therapy Diagnosis: generalized weakness    Rehabilitation Potential: Prognosis: Good    Plan: OT Frequency Recommended: one time visit   Treatment Interventions:  (No skilled interventions needed at this time )     Patient Goal  Patient Goal: to have less blurry vision    Risks/benefits/POC discussed patient     Goals: n/a      Discharge Recommendation: Home with no needs      Lawerance Bach, OTR/L  Acute Care Rehabilitation Clinical Coordinator  Pager [phone]  Ext. [phone]

## 2013-03-10 NOTE — Progress Notes (Addendum)
Pt screened again using NIH scale. Pt now has slight flattened left nasolaial fold that was not documented on previous NIH. Family states they noticed the facial droop earlier when MD was in to see her.  No facial numbness/tingling. Pt speaks [place] and family translates per pt request. It is noted that pt's has slower speech when reading. Family confirms that pt's speech has been slower than normal since symptoms began last night.. Pt able to read words and identify all pictures. Left leg drift.  Pt denies any headache or dizziness at this time.  Due to the document left facial droop, pt is made NPO until another TORBSST can be performed. Per nursing supervisor, there is no one here that can screen her tonight.  Pt/family aware she is NPO until morning. Decreased sensation in LUE and LLE.  No tingling. Strength is decreased on left side.

## 2013-03-11 ENCOUNTER — Inpatient Hospital Stay: Payer: Charity

## 2013-03-11 DIAGNOSIS — E785 Hyperlipidemia, unspecified: Secondary | ICD-10-CM

## 2013-03-11 DIAGNOSIS — E782 Mixed hyperlipidemia: Secondary | ICD-10-CM | POA: Diagnosis present

## 2013-03-11 HISTORY — DX: Hyperlipidemia, unspecified: E78.5

## 2013-03-11 LAB — CBC
Hematocrit: 38.4 % (ref 37.0–47.0)
Hgb: 12.3 g/dL (ref 12.0–16.0)
MCH: 21.5 pg — ABNORMAL LOW (ref 28.0–32.0)
MCHC: 32 g/dL (ref 32.0–36.0)
MCV: 67.3 fL — ABNORMAL LOW (ref 80.0–100.0)
MPV: 10.8 fL (ref 9.4–12.3)
Platelets: 192 (ref 140–400)
RBC: 5.71 — ABNORMAL HIGH (ref 4.20–5.40)
RDW: 14 % (ref 12–15)
WBC: 6.82 (ref 3.50–10.80)

## 2013-03-11 LAB — BASIC METABOLIC PANEL
Anion Gap: 8 (ref 5.0–15.0)
BUN: 13.7 mg/dL (ref 7.0–19.0)
CO2: 25 (ref 22–29)
Calcium: 9.6 mg/dL (ref 8.5–10.5)
Chloride: 104 (ref 98–107)
Creatinine: 0.8 mg/dL (ref 0.6–1.0)
Glucose: 275 mg/dL — ABNORMAL HIGH (ref 70–100)
Potassium: 4.2 (ref 3.5–5.1)
Sodium: 137 (ref 136–145)

## 2013-03-11 LAB — CELL MORPHOLOGY
Cell Morphology: ABNORMAL — AB
Platelet Estimate: NORMAL

## 2013-03-11 LAB — POCT GLUCOSE
Whole Blood Glucose POCT: 239 mg/dL — AB (ref 70–100)
Whole Blood Glucose POCT: 181 mg/dL — AB (ref 70–100)
Whole Blood Glucose POCT: 149 mg/dL — AB (ref 70–100)
Whole Blood Glucose POCT: 186 mg/dL — AB (ref 70–100)

## 2013-03-11 LAB — GFR: EGFR: >60

## 2013-03-11 NOTE — Progress Notes (Signed)
Per night RN report pt had left sided weakness esp left upper arm, worse than on admission. Came in to see pt, awake and alert left hand grip/strength 1/5. Denies any other symptoms. Dr. Chestine Spore was made aware with order made and carried out. Will monitor.

## 2013-03-11 NOTE — SLP Progress Note (Signed)
Speech Language Pathology    [facility]  [place], [place]  [phone]    Speech Language and Pathology Therapy Treatment Note       Patient:  Jacqueline Weber MRN#:  [id number]  [place] CARE UNIT M221/[id number]    Time of treatment:  [place]  Treatment # 2    Date of Initial Evaluations: 03/10/13  Initial Evaluation Recommendations: Reg diet with thin liquids    Note- pt with worsened L sided weakness today (v. last session)    Precautions and Contraindications:  Asp and GER    F/u HCT- The known infarct involving the right ventral pons is not  well seen on this examination. There has not been a significant interval  change.        Subjective:  Patient's medical condition is appropriate for Speech Language Pathology Therapy intervention at this time.  Patient is agreeable to participation in the therapy session.      Objective:  Observation of Patient/Vital Signs:  Patient is in bed with peripheral IV in place.    Pt seen for continued dysphagia dx/tx:  She was presented with the following items- crackers, thin liquids    Oral Phase- Adequate AP transit of both solids and liquids; pt also with adequate mastication of solids; no oral residual with either solids or liquids; no anterior bolus loss    Pharyngeal Phase- slightly delayed but coordinated swallow response for both liquids and solids; pt with adequate laryngeal elevation; clear breath sounds and vocal quality pre and post swallow, to cervical auscultation; no cough or choke with solids or liquids; vitals remained unchanged  Speech Production- daughter noting slightly decreased rate of speech and overall articulation since last session though reported that her mom's intelligibility remains 'good'- she is able to comprehend her mother's sentences without difficulty  Language- daughter reported that pt's receptive and expressive language skills remain near baseline;  pt was was seen to follow 1-2 step commands, indicate Y/N, and was able to verbally indicate basic wants and needs verbally though decreased initiation noted this session    Educated the patient and her family to role of Speech therapy, plan of care, and goals of therapy .    Family and patient education includes written and verbal information    Assessment:  1. Mild oropharyngeal phase dysphagia though adequate skills for baseline PO diet   2. Cont mild dysarthria- slightly worsened since last session relative to rate and articulation.    Patient left without needs and call bell within reach. RN notified of session outcome.        Plan:   Risks/benefits/POC discussed with family and patient (family acted as Veterinary surgeon)  SLP to follow and erform formal speech/language eval as appropriate  Will also follow for dysphagia dx/tx and modify diet as appropriate      Goals:   1.  Pt will demonstrate adequate oropharyngeal phase skills for baseline PO diet without s/s of aspiration X2  2. Speech/language goals to follow as appropriate                       Diet Recommendations: Continue cautiously with regular solids and thin liquids      Connye Burkitt. [place] Crane, [place]

## 2013-03-11 NOTE — OT Progress Note (Signed)
[facility]  [place]  [phone]    Occupational Therapy Treatment Note       Patient:  Jacqueline Weber MRN#:  [id number]  [place] CARE UNIT M221/M221-B    Time of treatment: Start Time: 1225 Stop Time: 1307   Time Calculation (min): 42 min    OT Visit Number: 2    Precautions and Contraindications:  Falls Risk         Subjective: Patient's medical condition is appropriate for Occupational Therapy intervention at this time.  Patient is agreeable to participation in the therapy session. Nursing clears patient for therapy. New orders for OT Re-Evaluation received this date due to Patient with worsening L sided weakness since initial OT Evaluation yesterday. New weakness likely related to evolution of her infarct as per Dr. Chestine Spore, Neurologist.        Objective:Observation of Patient/Vital Signs:  Patient is seated in a bedside chair with telemetry and peripheral IV in place. Patient's daughter at bedside for duration of session and used as an interpreter/translator as per Patient's preference.     Cognition  Arousal/Alertness: Appropriate responses to stimuli  Attention Span: Appears intact  Memory: Appears intact  Following Commands: Follows one step commands with repetition;Follows one step commands with increased time; Struggled at times to follow therapist's instructions for Self-ROM with her daughter providing translation for increased clarity.   Safety Awareness: minimal verbal instruction  Insights: Fully aware of deficits    Functional Mobility  Sit to Stand Transfers:  (Min A from arm-chair); verbal/tactile instructions for proper hand placement.   Bed <-->Toilet Transfers: minimal assistance (w/ hemi-walker); needed verbal/tactile instructions to use hemi-walker appropriately. Patient with small balance losses requiring Min A to re-gain balance.     Self-care and Home Management  Grooming: minimal  assistance;standing at sink;steadying;wash/dry hands; Needed assistance for steadying standing balance while reaching for paper towels.   Toileting: minimal assistance;steadying;verbal prompting;clothing management up;clothing management down; decreased functional use of L hand to grip/pinch waist band of pants therefore Patient used primarily a one handed technique to perform  LB dressing: Seated in arm-chair; Min A to donn/doff socks; needed to use R UE to bring R LE up to L knee and vice versa. Struggled to use L hand to assist with donn/doffing socks. Patient reports decreased sensation L UE vs. R UE however states she could sense light touch entire L UE.  Functional Transfers: minimal assistance;toilet transfer;verbal prompting;steadying      ROM:  R UE: WNL in all planes of motion  R UE strength: 4/5 grossly    L UE: gross grasp decreased by 25%; wrist extension decreased by 50%; elbow F/E WNL with increased time and effort; L shoulder flexion decreased by 25% (however inconsistent due to varying muscle weakness with increased repetitions)  --noted R shoulder hike to compensate for decreased AROM shoulder Flexion; Decreased motor control with shoulder mvmts.     Therapeutic Exercises: L UE  Shoulder AROM/AAROM: Flexion;Extension 1 x 10 reps  Shoulder Self-ROM: F/E 1 x 5 reps  Elbow AROM: Flexion;Extension: 2 x 10 reps  Hand AAROM: gross digit F/E 1 x 10 reps  Hand Self-ROM: gross digit F/E 1 x 10 reps  Wrist Self-ROM; F/E 1 x 10 reps  Forearm Supination/Pronation: 1 x 5 reps    B/L UE AROM for elbow F/E: 1 x 10 reps (focusing on moving arms at the same time for increased speed  and quality of mvmt).                             Treatment Activities:Educated the patient to role of occupational therapy, plan of care, goals of therapy and HEP, safety with mobility and ADLs. Patient instructed in and performed towel rolling exercise with L hand to increase [facility] followed by squeezing towel roll for increased grip  strength. Reviewed with Patient and Patient's daughter self-ROM exercises, using R UE as a gross assist for shoulder, wrist and digit F/E. Patient required repetition of instructions to increase clarity and carry-over of these exercises with her daughter translating as needed. Patient instructed to ring for nursing for all needs.     Patient left without needs and call bell within reach.  RN notified of session outcome.    Assessment:Patient presents: decreased strength;balance deficits;decreased independence with ADLs;decreased ROM;sensory impairment.  Patient presents with significant new LUE weakness, decreased motor control and L hand [place] vs. Yesterday's assessment on 03/10/13. Patient also presents with decreased dynamic standing balance/functional mobility thus increasing falls risk. Min A required today for all functional transfers.     Prognosis: With continued OT s/p acute discharge;Good    Patient Goal  Patient Goal:  (No stated goals at this time)      Plan: Continue with Occupational therapy services in acute care to address increasing independence and safety with ADL's. Focus next therapy session on L UE AROM/strength/FMC; sinkside ADL's.   Goal Formulation: Patient;Family  OT Plan  Treatment Interventions: ADL retraining;Functional transfer training;UE strengthening/ROM;Endurance training;Patient/Family training;Neuro muscular reeducation;Fine motor coordination activities  Discharge Recommendation: Acute Rehab  DME Recommended for Discharge:  (to be determined at rehab)  OT Frequency Recommended: 3-4x/wk  OT - Next Visit Recommendation: 03/12/13    Time For Goal Achievement: by time of discharge  ADL Goals  Patient will groom self: with supervision;5 visits  Patient will dress lower body: with supervision;5 visits  Patient will toilet: with supervision;5 visits  Mobility and Transfer Goals  Pt will transfer bed to toilet: with supervision;with hemi-walker;5 visits     Musculoskeletal Goals  Pt will  increase AROM: left;shoulder,wrist and digits to increase engagement in ADLs;5 visits;to WFL  Pt will perform Home Exercise Program: to increase engagement in ADLs;with caregiver/family assist;5 visits                     Discharge Recommendation: Acute Rehab  DME Recommended for Discharge:  (to be determined at rehab)        Hewitt Blade. Rigoberto Noel, MS,OTR/L  Pager # [phone]  [phone]

## 2013-03-11 NOTE — Progress Notes (Signed)
Assessment:    1. CVA. There has been some worsening likely related to the evolution of her infarct. Repeat head CT was unremarkable.   2. Diabetes.   3. Probable hypertension.   4. Probable hyperlipidemia.    Plan:    1. Continue aspirin.  2. OT/PT.  3. Likely will need Rehab.    Subjective:  Noted to have increased left sided weakness last night. Repeat head CT unremarkable.    Objective:  Last 24 Hour Vital Signs:  Temp:  [97.3 F (36.3 C)-97.8 F (36.6 C)] 97.3 F (36.3 C)  Heart Rate:  [70-99] 80   Resp Rate:  [18] 18   BP: (111-172)/(67-88) 172/87 mmHg    Physical Exam:    Asleep, arousable. Follows commands.  Pupils reactive, EOMI, decreased left NLF, tongue midline.  LUE 3/5, LLE 3+/5, right side 5/5.    Scheduled Meds:  Current Facility-Administered Medications   Medication Dose Route Frequency   . aspirin  324 mg Oral Daily   . enoxaparin  40 mg Subcutaneous Daily   . glimepiride  4 mg Oral QAM W/BREAKFAST   . insulin aspart  3-12 Units Subcutaneous TID AC   . insulin glargine  10 Units Subcutaneous QHS   . insulin regular  4 Units Intravenous Once   . lisinopril  5 mg Oral Daily   . pravastatin  20 mg Oral QHS   . sitaGLIPtin  50 mg Oral Daily       Continuous Infusions:       PRN Meds:  acetaminophen, labetalol, morphine, ondansetron, pneumococcal vaccine-23    Last 24 Hour Labs:  Results     Procedure Component Value Units Date/Time    Cell MorpHology [id number]  (Abnormal) Collected:03/11/13 0637     Cell Morphology: Abnormal (A) Updated:03/11/13 0843     Anisocytosis =2+ (A)      Microcytic =2+ (A)      Hypochromia =2+ (A)      Platelet Estimate Normal     CBC without differential [id number]  (Abnormal) Collected:03/11/13 0637    Specimen Information:Blood / Blood Updated:03/11/13 0843     WBC 6.82      RBC 5.71 (H)      Hgb 12.3 g/dL      Hematocrit 38.4 %      MCV 67.3 (L) fL      MCH 21.5 (L) pg      MCHC 32.0 g/dL      RDW 14 %      Platelets 192      MPV 10.8 fL     POCT Glucose  [[id number]]  (Abnormal) Collected:03/11/13 0720     POCT Glucose WB 239 (A) mg/dL Updated:03/11/13 [id number]    Basic Metabolic Panel [id number]  (Abnormal) Collected:03/11/13 0637    Specimen Information:Blood Updated:03/11/13 0736     Glucose 275 (H) mg/dL      BUN 13.7 mg/dL      Creatinine 0.8 mg/dL      CALCIUM 9.6 mg/dL      Sodium 137      Potassium 4.2      Chloride 104      CO2 25      Anion Gap 8.0     GFR [id number] Collected:03/11/13 0637     EGFR >60.0 Updated:03/11/13 0736    POCT Glucose [id number]  (Abnormal) Collected:03/10/13 2100     POCT Glucose WB 216 (A) mg/dL Updated:03/10/13 2203    POCT Glucose [id number]  (Abnormal) Collected:03/10/13 1630  POCT Glucose WB 128 (A) mg/dL Updated:03/10/13 1649    POCT Glucose [[id number]]  (Abnormal) Collected:03/10/13 1129     POCT Glucose WB 280 (A) mg/dL Updated:03/10/13 1130          Last 24 Hour Radiology:  Radiology Results (24 Hour)     Procedure Component Value Units Date/Time    CT Head WO Contrast [id number] Collected:03/11/13 0826    Order Status:Completed  Updated:03/11/13 [id number]    Narrative:    HISTORY: Left upper extremity weakness.    TECHNIQUE: Axial CT images of the head were obtained, without  intravenous contrast.    COMPARISON: Prior head CT and MRI dated 03/09/2013.    FINDINGS: The known acute infarct involving the right ventral pons is  not well seen on this examination. No acute infarct identified  elsewhere. There is no mass effect or midline shift. There is no  evidence of an acute intracranial hemorrhage or territorial infarct. The  osseous structures are unremarkable.          Impression:     The known infarct involving the right ventral pons is not  well seen on this examination. There has not been a significant interval  change.    Shelby Dubin, MD   03/11/2013 8:29 AM

## 2013-03-11 NOTE — Progress Notes (Signed)
Pt's daughter stated she got her mother up to BR and noted left arm to be very weak again. Pt unable to raise arm >50%. Grip weakened to 1/5. Pedal strength remains the same. Placed Pt back on NC at 3 lpm and will monitor.

## 2013-03-11 NOTE — Consults (Addendum)
Ask3Teach3 Patient Education Report    Drinda Hasberry [id number]    Because your health is important to [place], we want you to know 3 things about every NEW medication you are given while you are in the hospital :      1. The name of the medication    2. Why you are taking it, and   3. Some possible side effects you might experience.     It was a pleasure to provide medication education for you today about the following medications.    Medication Indications Possible Side Effects     Aspirin Prevents stroke and heart attack Increased bleeding, upset stomach, nausea      Enoxaparin(Lovenox) Blood clots, atrial fibrillation Increased bleeding     Glimeperide Diabetes Low blood sugar     Insulin aspart(Novolog) Diabetes Low blood sugar     Insulin glargine (Lantus) Diabetes Low blood sugar     Lisinopril(Zestril) Blood pressure, heart failure Dizziness, low blood pressure, cough, increased potassium levels     Sitagliptin (Januvia) Lowers blood sugar Diarrhea           Thank you for your time.  If you have any questions, please ask!    Rennis Golden, Pharmacist  Department of Pharmacy  [facility]  [address]  [place], [place]  [address]  [phone]           Spoke to the patient and delivered Ask3Teach3 print out . Rennis Golden

## 2013-03-11 NOTE — Progress Notes (Signed)
[facility] Hospitalist Daily Progress Note        Date Time: 03/11/2013  10:28 AM  Patient Name:Jacqueline Weber  OE:[id number]  PCP: Levon Hedger, MD  Attending Physician:Xaden Kaufman Allene Dillon M.D.      Chief Complaint:      Chief Complaint   Patient presents with   . Stroke       Subjective:   Says feeling worst today with worsening left sided weakness both upper and lower extremities.  No cp, no sob, no fever, no chill, no abd pain, nausea, vomiting    Assessment/Plan   62 yo female here with acute CVA of the right pons     Diagnosis: Principal Problem:   *CVA (cerebral vascular accident)  Active Problems:   Type 2 diabetes mellitus with other manifestations   HTN (hypertension) urgency   Hyperlipidemia    1. 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  Pt weakness is secondary to evolving stroke.  She will need repeat pt/ot evaluation for discharge planning  2. Uncontrol HTN   - currently on lisinopril 5mg , blood pressure is fluctuating, will see how trend is today and see if medication dosage needs to be changed.   3. DM II  - currently on januvia, lantus amaryl  - will need better blood sugar control as an outpatient   4. Hyperlipidemia - on pravastatin   5. Discussed with pt regarding acute stroke and increased risk of having repeat stroke, advice her to f/u with pcp on discharge to aggressive manage HTN, DM and hyperlipidemia for secondary prevention      DVT Prohylaxis:lovenox   Code Status: No Order   Disposition: pending pt/ot eval with evolving stroke  Prognosis:good  type of Admission:Inpatient  Estimated Length of Stay (including stay in the ER receiving treatment): 3-5  Medical Necessity for stay:acute CVA, need pt/ot eval, HTN treatment     Allergies:   No Known Allergies    Physical Exam:    height is 1.524 m (5') and weight is 65.3 kg (143 lb 15.4 oz). Her temporal artery temperature is  98.3 F (36.8 C). Her blood pressure is 119/56 and her pulse is 83. Her respiration is 18 and oxygen saturation is 99%.   Body mass index is 28.12 kg/(m^2).  Filed Vitals:    03/10/13 2200 03/11/13 0200 03/11/13 0600 03/11/13 1000   BP: 111/67 112/70 172/87 119/56   Pulse: 86 70 80 83   Temp: 97.8 F (36.6 C) 97.3 F (36.3 C) 97.3 F (36.3 C) 98.3 F (36.8 C)   TempSrc: Temporal Artery Temporal Artery Temporal Artery Temporal Artery   Resp: 18 18 18 18    Height:       Weight:   65.3 kg (143 lb 15.4 oz)    SpO2: 98% 97% 100% 99%     Intake and Output Summary (Last 24 hours) at Date Time  No intake or output data in the 24 hours ending 03/11/13 1028    Constitutional: Patient is oriented to person, place, and time. Patient appears well-developed and well-nourished.   Head: Normocephalic and atraumatic.  Eyes- pupils equal and reactive, extraocular eye movements intact, sclera anicteric  Ears - external ear canals normal, right ear normal, left ear normal  Nose - normal and patent, no erythema, discharge or polyps and normal nontender sinuses  Mouth - mucous membranes moist, pharynx normal without lesions  Neck: Normal range of motion.  Neck supple. No JVD present. No tracheal deviation present. No thyromegaly present.   Cardiovascular: Normal rate, regular rhythm, normal heart sounds and intact distal pulses.  Exam reveals no gallop and no friction rub. No murmur heard.  Pulmonary/Chest: Effort normal and breath sounds normal. No stridor. No respiratory distress. Patient has no wheezes. No rales were present.  Exhibits no tenderness.   Abdominal: Soft. Bowel sounds are normal. Patient exhibits no distension and no mass was palpable. There is no tenderness. There is no rebound and no guarding.   Musculoskeletal: Normal range of motion. Normal passive rom.  Weakness of the left side 3-4/5 upper and lower extremities   Lymphadenopathy:  Patient has no cervical adenopathy.   Neurological: Patient is alert and oriented  to person, place, and time and has normal reflexes. No cranial nerve deficit.  Normal muscle tone. Coordination normal.   Skin: Skin is warm. No rash noted. Patient is not diaphoretic. No erythema. No pallor.   Psychiatric: Has normal mood and affect. Behavior is normal. Judgment and thought content normal.    Consult Input/Plan     Plan  None    Medications:     Current Facility-Administered Medications   Medication Dose Route Frequency Last Rate Last Dose   . acetaminophen (TYLENOL) tablet 650 mg  650 mg Oral Q4H PRN   650 mg at 03/11/13 0842   . aspirin chewable tablet 324 mg  324 mg Oral Daily   324 mg at 03/10/13 0900   . enoxaparin (LOVENOX) syringe 40 mg  40 mg Subcutaneous Daily   40 mg at 03/10/13 1733   . glimepiride (AMARYL) tablet 4 mg  4 mg Oral QAM W/BREAKFAST   4 mg at 03/11/13 0837   . insulin aspart (NovoLOG) injection 3-12 Units  3-12 Units Subcutaneous TID AC   6 Units at 03/11/13 0837   . insulin glargine (LANTUS) injection 10 Units  10 Units Subcutaneous QHS   10 Units at 03/10/13 2321   . insulin regular (HumuLIN,NovoLIN) injection 4 Units  4 Units Intravenous Once       . labetalol (NORMODYNE,TRANDATE) injection 20 mg  20 mg Intravenous TID PRN       . lisinopril (PRINIVIL,ZESTRIL) tablet 5 mg  5 mg Oral Daily   5 mg at 03/10/13 1734   . morphine injection 2 mg  2 mg Intravenous Q4H PRN   2 mg at 03/09/13 2011   . ondansetron (ZOFRAN) injection 4 mg  4 mg Intravenous Once PRN       . pneumococcal vaccine-23 (PNEUMOVAX 23) injection 0.5 mL  0.5 mL Subcutaneous Prior to discharge       . pravastatin (PRAVACHOL) tablet 20 mg  20 mg Oral QHS   20 mg at 03/10/13 2315   . sitaGLIPtin (JANUVIA) tablet 50 mg  50 mg Oral Daily   50 mg at 03/10/13 0900     Review of Systems:   A comprehensive review of systems has no changes since H&P was obtained except as mentioned in the subjective section.    Labs:     Results     Procedure Component Value Units Date/Time    Cell MorpHology [id number]  (Abnormal)  Collected:03/11/13 0637     Cell Morphology: Abnormal (A) Updated:03/11/13 0843     Anisocytosis =2+ (A)      Microcytic =2+ (A)      Hypochromia =2+ (A)      Platelet Estimate Normal     CBC without differential [  [id number]  (Abnormal) Collected:03/11/13 0637    Specimen Information:Blood / Blood Updated:03/11/13 0843     WBC 6.82      RBC 5.71 (H)      Hgb 12.3 g/dL      Hematocrit 38.4 %      MCV 67.3 (L) fL      MCH 21.5 (L) pg      MCHC 32.0 g/dL      RDW 14 %      Platelets 192      MPV 10.8 fL     POCT Glucose [[id number]]  (Abnormal) Collected:03/11/13 0720     POCT Glucose WB 239 (A) mg/dL Updated:03/11/13 [id number]    Basic Metabolic Panel [id number]  (Abnormal) Collected:03/11/13 0637    Specimen Information:Blood Updated:03/11/13 0736     Glucose 275 (H) mg/dL      BUN 13.7 mg/dL      Creatinine 0.8 mg/dL      CALCIUM 9.6 mg/dL      Sodium 137      Potassium 4.2      Chloride 104      CO2 25      Anion Gap 8.0     GFR [id number] Collected:03/11/13 0637     EGFR >60.0 Updated:03/11/13 0736    POCT Glucose [id number]  (Abnormal) Collected:03/10/13 2100     POCT Glucose WB 216 (A) mg/dL Updated:03/10/13 2203    POCT Glucose [id number]  (Abnormal) Collected:03/10/13 1630     POCT Glucose WB 128 (A) mg/dL Updated:03/10/13 1649    POCT Glucose [[id number]]  (Abnormal) Collected:03/10/13 1129     POCT Glucose WB 280 (A) mg/dL Updated:03/10/13 1130          Rads:   Radiological Procedure reviewed.  Radiology Results (24 Hour)     Procedure Component Value Units Date/Time    CT Head WO Contrast [id number] Collected:03/11/13 0826    Order Status:Completed  Updated:03/11/13 [id number]    Narrative:    HISTORY: Left upper extremity weakness.    TECHNIQUE: Axial CT images of the head were obtained, without  intravenous contrast.    COMPARISON: Prior head CT and MRI dated 03/09/2013.    FINDINGS: The known acute infarct involving the right ventral pons is  not well seen on this examination. No acute infarct  identified  elsewhere. There is no mass effect or midline shift. There is no  evidence of an acute intracranial hemorrhage or territorial infarct. The  osseous structures are unremarkable.          Impression:     The known infarct involving the right ventral pons is not  well seen on this examination. There has not been a significant interval  change.    Shelby Dubin, MD   03/11/2013 8:29 AM            Time spent for evaluation, management and coordination of care:   :35 minutes      Signed by: Eugenia Pancoast  03/11/2013 10:28 AM

## 2013-03-11 NOTE — PT Progress Note (Addendum)
Physical Therapy Note    [facility]  [place], [place]    Department of Rehabilitation  [phone]    Physical Therapy Daily Treatment Note    Patient: Jacqueline Weber    MRN#: [id number]     Time of Treatment: Start Time: 1115 Stop Time: 1212 Time Calculation (min): 57 min    PT Visit Number: 2    Patient's medical condition is appropriate for Physical Therapy intervention at this time.    Precautions and Contraindications:   Precautions  Other Precautions: fall risk    Subjective: Family and/or guardian are agreeable to patient's participation in the therapy session. Nursing clears patient for therapy. Family present ,declined offer for interpreter.   Pain Assessment  Pain Assessment: No/denies pain (however clo R tricep muscle soreness,no number given) RN,grace aware of same.    Objective:  Observation of Patient/Vital Signs:  Patient is in bed with telemetry and peripheral IV in place.    Cognition  Arousal/Alertness: Appropriate responses to stimuli (delayed processing info to perform task)  Attention Span: Appears intact  Memory: Appears intact  Following Commands: Follows one step commands with increased time  Safety Awareness: minimal verbal instruction  Insights: Fully aware of deficits;Educated in safety awareness    Functional Mobility  Rolling: Stand by assistance;Min assist to left (verbal/tactile for sequence:bring RUE to SR,verbal to pull)  Supine to Sit: Min assist to right (increase HOB,verbal to complete task)  Scooting: Stand by assistance;Min assist to left (verbal/tactile,visual for hand placement,and sequence)  Sit to Stand: Min assist;Mod assist (verbal for safe,hand placement)  Stand to Sit: Min assist (eccentric control)  Transfers  Bed to Chair: Min assist to left;Mod assist to left  Stand Pivot Transfers: Minimal assistance  Posterior Scoot Transfers: Supervision  Device Used for Functional Transfer: hemi walker  Locomotion  Ambulation: minimal  assistance;moderate assistance;with hemi walker  Ambulation Distance (Feet): 31 Feet (4x1w/HW ,7x1w/SPC,10x1w/HHA,10x1 w/HHA=31)  Pattern: L decreased stance time;L foot decreased clearance;ataxic;decreased cadence;decreased step length  Verbal/tactile instructions for sequence and safety for all functional and therapeutic mobility, pt slightly impulsive.    Therapeutic Exercise  Quad Sets: Bx10w/3sec  Glute Sets: Bx10 w/ 3 sec hold  Hip Flexion: seated:march x 10,,w/ inc tme to lift LLE  Hip Abduction: /add x 20  Knee AROM : seated:RLAQx10,LLAQ x 10 w/A/AAROM  Ankle Pumps: seated:R x 10,Supine:A/AAROM x 10  Strengthening: patient education;isometrics  Pt instructed/educated on LE exercise w/ verbal/tactile instruction for pace and technique       Neuro Re-Ed  Sitting Balance: sitting reaching activities;with assistance;with instruction;with support;supervision  Standing Balance: variable environment/backgrounds;patient education;standing weight shifting all planes;dynamic gait training;with assistance;with instruction;with support;minimal assistance;moderate assistance     Treatment Activities: Pt instructed and educated on therapeutic and functional mobility as indicated above,w/ instructions for LE ex in seated and supine for LAQ,AP,hip flexion and hip abd/add. Standing activities w/ therapist for ws side to side prior to gait. Toileted person w/ Ind w/ pericare. Min assist for clothing management, min for standing.  Gt training w/ various AD to determine which device appropriate,due to dec strength in LUE/hand.  Educated the patient to role of physical therapy, plan of care, goals of therapy and HEP, safety with mobility and ADLs.  At end of session pt seated upright in bed, call bell and items in reach. Family present. RN aware    Assessment: Pt stroke symptoms extended last night. Pt functional status has declined to left UE/LE.decrease standing balance,decrese bed  mobility, decreases strength in LU/LE. Left  knee tends to buckle slightly with extended period of standing. Discuss change in status w/ Markell Sciascia,PT. Increase frequency to 6-7x/wk for stroke protocol.  Assessment: Decreased LE strength;Decreased endurance/activity tolerance;Impaired coordination;Impaired motor control;Decreased functional mobility;Decreased balance;Gait impairment Prognosis: Good;With continued PT status post acute discharge.   Progress: Progressing toward goals     Patient Goal: to be able to walk again    Treatment/Interventions: Exercise;Gait training;Neuromuscular re-education;Functional transfer training;Endurance training;Patient/family training;Equipment eval/education;Bed mobility  PT Frequency: 6-7x/wk     Goals:   Goals  Goal Formulation: With patient/family  Time for Goal Acheivement: By time of discharge  Goals: Select goal  Pt Will Go Supine To Sit: With stand by assist;to maximize functional mobility and independence;7 visits  Pt Will Sit Edge of Bed: 6-10 min;With stand by assist;to maximize functional mobility and independence;7 visits (with variable UE support)  Pt Will Stand: 1-2 min;With stand by assist;to maximize functional mobility and independence;5 visits (with UE support, with equal WB B)  Pt Will Ambulate: 51-100 feet;with single point cane;With minimal assist;to maximize functional mobility and independence;7 visits  Pt Will Go Up / Down Stairs: Discontinued (comment) (Due to worsening of mobility status at this time)    Plan:   Continue with Physical Therapy services to address standing balance and weakness. Focus next session on progressive standing balance,gait and there ex.  Treatment/Interventions: Exercise;Gait training;Neuromuscular re-education;Functional transfer training;Endurance training;Patient/family training;Equipment eval/education;Bed mobility   PT Frequency: 6-7x/wk     Discharge Recommendation: Acute Rehab;SNF  Wynona Meals  PM&R  [id number]    I have reviewed and agree with the updated plan of care  and have updated goals as above.   Marina Gravel, PT, DPT  Pager #: [phone]

## 2013-03-11 NOTE — Progress Notes (Signed)
Sitting OOB. "feels much better", left hand grip/strength remains 1/5 although pt verbalized a little resolution of symptom. CT head done with unremarkable result. Will continue to monitor.

## 2013-03-12 LAB — POCT GLUCOSE
Whole Blood Glucose POCT: 159 mg/dL — AB (ref 70–100)
Whole Blood Glucose POCT: 274 mg/dL — AB (ref 70–100)
Whole Blood Glucose POCT: 131 mg/dL — AB (ref 70–100)
Whole Blood Glucose POCT: 196 mg/dL — AB (ref 70–100)

## 2013-03-12 LAB — BASIC METABOLIC PANEL
Anion Gap: 6 (ref 5.0–15.0)
BUN: 12.4 mg/dL (ref 7.0–19.0)
CO2: 27 (ref 22–29)
Calcium: 9.3 mg/dL (ref 8.5–10.5)
Chloride: 108 — ABNORMAL HIGH (ref 98–107)
Creatinine: 0.7 mg/dL (ref 0.6–1.0)
Glucose: 141 mg/dL — ABNORMAL HIGH (ref 70–100)
Potassium: 3.8 (ref 3.5–5.1)
Sodium: 141 (ref 136–145)

## 2013-03-12 LAB — CBC
Hematocrit: 38.3 % (ref 37.0–47.0)
Hgb: 12.1 g/dL (ref 12.0–16.0)
MCH: 21.4 pg — ABNORMAL LOW (ref 28.0–32.0)
MCHC: 31.6 g/dL — ABNORMAL LOW (ref 32.0–36.0)
MCV: 67.7 fL — ABNORMAL LOW (ref 80.0–100.0)
MPV: 10.8 fL (ref 9.4–12.3)
Platelets: 196 (ref 140–400)
RBC: 5.66 — ABNORMAL HIGH (ref 4.20–5.40)
RDW: 14 % (ref 12–15)
WBC: 6.54 (ref 3.50–10.80)

## 2013-03-12 LAB — CELL MORPHOLOGY
Cell Morphology: ABNORMAL — AB
Platelet Estimate: NORMAL

## 2013-03-12 LAB — GFR: EGFR: >60

## 2013-03-12 MED ORDER — KETOROLAC TROMETHAMINE 30 MG/ML IJ SOLN
15.00 mg | Freq: Once | INTRAMUSCULAR | Status: AC
Start: 2013-03-12 — End: 2013-03-12
  Administered 2013-03-12: 15 mg via INTRAVENOUS
  Filled 2013-03-12: qty 1

## 2013-03-12 NOTE — OT Progress Note (Signed)
[facility]  [place]  [phone]    Occupational Therapy Treatment Note       Patient:  Jacqueline Weber MRN#:  [id number]  [place] CARE UNIT M221/M221-B    Time of treatment: Start Time: 1159 Stop Time: 1237   Time Calculation (min): 38 min    OT Visit Number: 3    Precautions and Contraindications:  Falls Risk         Subjective: Patient's medical condition is appropriate for Occupational Therapy intervention at this time.  Patient is agreeable to participation in the therapy session. Nursing clears patient for therapy. Patient's daughter at bedside for duration of session and used as an interpreter as per Patient's preference.        Objective:Observation of Patient/Vital Signs:  Patient is seated in a bedside chair with telemetry and peripheral IV in place.    Cognition  Arousal/Alertness: Appropriate responses to stimuli  Attention Span: Appears intact  Memory: Appears intact  Following Commands: Follows all commands and directions without difficulty  Safety Awareness: minimal verbal instruction  Insights: Fully aware of deficits    Functional Mobility  Sit to Stand Transfers:  (Min A from arm-chair)  Stand to Sit Transfers:  (Min A for eccentric control)  Bed <-->Toilet Transfers: minimal assistance (w/ rw); needed occasional assistance to maneuver rw safely, especially in small spaces; able to maintain L hand on rw to grasp.    Self-care and Home Management  Grooming: minimal assistance;standing with assistive device;wash/dry hands  Toileting: minimal assistance;clothing management up;clothing management down;grab bar use;increased time to complete.  LB dressing: Seated in arm-chair; donn/doffed socks with increased time and effort incorporating use of L hand as able.  Functional Transfers: minimal assistance;toilet transfer;verbal prompting;steadying    Therapeutic Exercises:Patient guided thru and  performed the following UB AROM exercises to faciltate increased endurance and strength for ADL's:  L Shoulder AROM: Flexion;Extension 1 x 10 reps (increased muscle fatigue with increased time frame; Patient able to perform L shoulder F to WNL's however inconsistent)  L Shoulder Self-ROM: F/E 1 x 5 reps   B/L Elbow AROM: Flexion;Extension: 1 x 10 reps   L Hand AAROM: gross digit F/E 1 x 10 reps (AAROM for index finger only--middle, ring and little fingers F/E WNL's; limited L thumb ROM for extension and abduction)   L Hand Self-ROM: gross digit F/E 1 x 10 reps   L Wrist Self-ROM; F/E 1 x 10 reps (Patient demonstrated L wrist extension to WNL's however inconsistent)  L Forearm Supination/Pronation: 1 x 5 reps; noted L shoulder hike with this mvmt                               Treatment Activities: Educated the patient to role of occupational therapy, plan of care, goals of therapy and HEP, safety with mobility and ADLs, home safety. Discussed with Patient and her daughter recommendations for shower seat and grab bars to increase safety/independence with shower transfers; also recommended for Patient to have supervision with shower transfers upon d/c to Home to ensure safety.   Patient issued blue resistance sponge and instructed in gross digit F/E and tip to tip pinches using thumb and index fingers. Patient's daughter present and educated in same to assist Patient as needed. Patient encouraged to perform the above mentioned exercises intermittently during the day to increase AROM/motor control and  coordination for ADL's. Also encouraged Patient to increase functional use of L hand with ADL's.      Patient left without needs and call bell within reach.  RN notified of session outcome.    Assessment:Improvements made today with L UE AROM/functional use however continues to demonstrate decreased motor control/coordination and decreased sensation vs. R UE. Patient tearful at times during session regarding LUE weakness.  Requires verbal/tactile instructions for increased safety with functional mobility with rw use.   decreased ROM;decreased strength;balance deficits;decreased independence with ADLs;sensory impairment;decreased safety awareness;decreased independence with IADLs  Prognosis: Good;With continued OT s/p acute discharge    Patient Goal  Patient Goal:  (to go to rehab)      Plan: Continue with Occupational therapy services in acute care to address increasing independence and safety with ADL's.. Focus next therapy session on L UE AROM/coordination; toileting tasks; LB dressing.      OT Plan  Treatment Interventions: ADL retraining;Functional transfer training;UE strengthening/ROM;Endurance training;Patient/Family training;Neuro muscular reeducation;Fine motor coordination activities;Compensatory technique education  Progress: Progressing toward goals  Discharge Recommendation: Acute Rehab  DME Recommended for Discharge:  (to be determined at rehab; Spoke with Patient and her daugte)  OT Frequency Recommended: 3-4x/wk  OT - Next Visit Recommendation: 03/13/13                                      Discharge Recommendation: Acute Rehab  DME Recommended for Discharge:  (to be determined at rehab; Spoke with Patient and her daughter regarding shower seat and grab bar recommendations)        Hewitt Blade. Rigoberto Noel, MS,OTR/L  Pager # [phone]  [phone]

## 2013-03-12 NOTE — Progress Notes (Signed)
Assessment:    1. CVA, improving.  2. Diabetes.   3. Hypertension.   4. Hyperlipidemia.    Plan:    1. Continue aspirin.   2. OT/PT.   3. Will need Rehab.      Subjective:  No new complaints. Moving left side better.    Objective:  Last 24 Hour Vital Signs:  Temp:  [97.6 F (36.4 C)-98.9 F (37.2 C)] 97.6 F (36.4 C)  Heart Rate:  [83-87] 87   Resp Rate:  [16-18] 16   BP: (107-160)/(56-79) 160/79 mmHg    Physical Exam:    Alert, follows commands.  Pupils reactive, decreased left NLF.  LUE 3+-4-/5, LLE 3+/5, right side 5/5.    Scheduled Meds:  Current Facility-Administered Medications   Medication Dose Route Frequency   . aspirin  324 mg Oral Daily   . enoxaparin  40 mg Subcutaneous Daily   . glimepiride  4 mg Oral QAM W/BREAKFAST   . insulin aspart  3-12 Units Subcutaneous TID AC   . insulin glargine  10 Units Subcutaneous QHS   . insulin regular  4 Units Intravenous Once   . lisinopril  5 mg Oral Daily   . pravastatin  20 mg Oral QHS   . sitaGLIPtin  50 mg Oral Daily       Continuous Infusions:       PRN Meds:  acetaminophen, labetalol, morphine, ondansetron, pneumococcal vaccine-23    Last 24 Hour Labs:  Results     Procedure Component Value Units Date/Time    Cell MorpHology [id number]  (Abnormal) Collected:03/12/13 0615     Cell Morphology: Abnormal (A) Updated:03/12/13 0733     Microcytic =2+ (A)      Hypochromia =2+ (A)      Ovalocytes =1+ (A)      Platelet Estimate Normal     CBC without differential [id number]  (Abnormal) Collected:03/12/13 0615    Specimen Information:Blood / Blood Updated:03/12/13 0733     WBC 6.54      RBC 5.66 (H)      Hgb 12.1 g/dL      Hematocrit 38.3 %      MCV 67.7 (L) fL      MCH 21.4 (L) pg      MCHC 31.6 (L) g/dL      RDW 14 %      Platelets 196      MPV 10.8 fL     POCT Glucose [[id number]]  (Abnormal) Collected:03/12/13 0727     POCT Glucose WB 159 (A) mg/dL Updated:03/12/13 123456    Basic Metabolic Panel [id number]  (Abnormal) Collected:03/12/13 0615    Specimen  Information:Blood Updated:03/12/13 0638     Glucose 141 (H) mg/dL      BUN 12.4 mg/dL      Creatinine 0.7 mg/dL      CALCIUM 9.3 mg/dL      Sodium 141      Potassium 3.8      Chloride 108 (H)      CO2 27      Anion Gap 6.0     GFR [[id number]] Collected:03/12/13 0615     EGFR >60.0 Updated:03/12/13 0638    POCT Glucose [[id number]]  (Abnormal) Collected:03/11/13 2050     POCT Glucose WB 186 (A) mg/dL Updated:03/11/13 2051    POCT Glucose [id number]  (Abnormal) Collected:03/11/13 1630     POCT Glucose WB 149 (A) mg/dL Updated:03/11/13 1711    POCT Glucose [[id number]]  (Abnormal) Collected:03/11/13 1125     POCT  Glucose WB 181 (A) mg/dL Updated:03/11/13 1205

## 2013-03-12 NOTE — Progress Notes (Signed)
PT/OT recommended acute rehab, after several choices were provided for acute rehab to patient/ family they desired [facility]. Referred patient to [facility] liasion Diane [place]# [phone] who is reviewing the patient's financial charity paperwork and will let case management know if the patient is eligible tomorrow to go to [facility] acute rehab patient's 1st choice. Contact info provided and advised to contact case management with any further questions or concerns. Dr. Elisabeth Cara hospitalist aware and agreeable of post care plan.

## 2013-03-12 NOTE — Progress Notes (Signed)
Pt and fam member reported increased weakness to left hand grip, noted that pt was unable to squeeze a ball (able to do it earlier this shift). Dr. Elisabeth Cara was made aware, no order made. To monitor pt.

## 2013-03-12 NOTE — PT Progress Note (Signed)
[facility]  [place]    Department of Rehabilitation  [phone]    Physical Therapy Daily Treatment Note    Patient: Jacqueline Weber    MRN#: [id number]     Time of Treatment: Start Time: [id number] Stop Time: 1036 Time Calculation (min): 34 min    PT Visit Number: 3    Patient's medical condition is appropriate for Physical Therapy intervention at this time.      Precautions  Other Precautions:  (fall)    Subjective: Patient is agreeable to participation in the therapy session. Nursing clears patient for therapy. Pt's daughter present to translate.   Pain Assessment  Pain Assessment: No/denies pain     Objective:  Observation of Patient/Vital Signs:  Patient is in bed with telemetry and peripheral IV in place.         Functional Mobility  Supine to Sit: Stand by assistance  Scooting: Stand by assistance  Sit to Stand: Stand by assistance.  Pt instructed to place hands on bed or arms of chair to push up to standing.  Stand to Sit: Stand by assistance. pt instructed to line up to chair with hands on rw, feel chair with B LE's, reach hands back for arms of chair, then sit.    Locomotion  Ambulation: minimal assistance;with front-wheeled walker  Ambulation Distance (Feet): 100 Feet  Pattern: L foot decreased clearance;L foot drop;decreased cadence;decreased step length  Pt instructed to increase clearance of L foot when stepping forward   Therapeutic Exercise  Hip Flexion: seated Bx20 R, x 10 L  Hip Abduction: /hip adduction seated Bx20  Knee AROM : seated laq's Bx20  Ankle Pumps: seated heel/toe raises R, L PF with resistance, prom DF  Pt instructed in technique and pacing for there ex performed as documented above. Copy of same given to pt who was instructed to perform ex's 2x a day, 10-20 reps each to increase strength.        Neuro Re-Ed  Standing Balance: variable environment/backgrounds;patient education;standing weight shifting all planes;dynamic gait training;with  assistance;with instruction;with support;minimal assistance and stand by assistance.  Pt stood w/o UE support, stand by assist/contact guard assist and performed the following: stand w/o UE support 1 min without loss of balance; stand w/o support, eyes closed, WBOS, for 20 secs, increased postural sway; stand NBOS, w/o support for about 20 secs, increased postural sway. Pt stood w/o support with perturbations all planes without loss of balance, contact guard assist.      Treatment Activities: pt instructed in and participated in therapeutic activities, neuro re-ed, and there ex per documentation above.      Educated the patient to role of physical therapy, plan of care, goals of therapy and HEP, safety with mobility and ADLs.    Assessment:   Assessment: Decreased LE strength;Decreased endurance/activity tolerance;Decreased functional mobility;Decreased balance;Gait impairment;Impaired motor control     Progress: Progressing toward goals   Pt now able to use rw for ambulation.  Pt continues with increased fall risk secondary to L sided strength deficits. Pt would likely benefit from acute or subacute rehab to maximize recovery to PLOF after CVA.   Plan:   Continue with Physical Therapy services to address strength, endurance and balance deficits . Focus next session on*bed mobs, transfers, there ex, dynamic standing balance activities and there ex to increase pt's functional independence and safety.  .  Treatment/Interventions: Exercise;Gait training;Neuromuscular re-education;Functional transfer training;Endurance training;Patient/family training;Equipment eval/education;Bed mobility   PT Frequency: 6-7x/wk  Discharge Recommendation: Acute Rehab;SNF  Equipment Recommendations: DME Recommended for Discharge: Front wheel walker

## 2013-03-12 NOTE — Progress Notes (Signed)
Pt now able to move her left hand, now able to squeeze the ball/foam she was holding. Will continue to monitor.

## 2013-03-12 NOTE — Plan of Care (Signed)
Pt cont to be a, o x 4. LUE and LLE weaker compare to R side. Pt gets OOB with minimal assistance and uses BSC. Ambulated pt in room < 20 steps. Pt required one assist. C/o generalized pain addressed with prn pain med. Positive effect. Bed low and locked. Call bell in reach. Bed alarm in place. Family in room and spending night with pt. Cont to monitor and report changes to attending.

## 2013-03-12 NOTE — Progress Notes (Signed)
[facility] Hospitalist Daily Progress Note        Date Time: 03/12/2013  9:53 AM  Patient Name:Jacqueline Weber  [id number]  PCP: Levon Hedger, MD  Attending Physician:Savahanna Almendariz Allene Dillon M.D.      Chief Complaint:      Chief Complaint   Patient presents with   . Stroke       Subjective:   Says feeling better today, strength is improving.     Assessment/Plan   62 yo female here with acute CVA of the right pons     Diagnosis: Principal Problem:   *CVA (cerebral vascular accident)  Active Problems:   Type 2 diabetes mellitus with other manifestations   HTN (hypertension) urgency   Hyperlipidemia    1. 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 Uncontrol HTN   - currently on lisinopril 5mg , blood pressure is acceptable,   3. DM II  - currently on januvia, lantus, amaryl  - will need better blood sugar control as an outpatient   4. Hyperlipidemia - on pravastatin   5. Discussed with pt regarding acute stroke and increased risk of having repeat stroke, advice her to f/u with pcp on discharge to aggressive manage HTN, DM and hyperlipidemia for secondary prevention      DVT Prohylaxis:lovenox   Code Status: No Order   Disposition: acute rehab  Prognosis:good  type of Admission:Inpatient  Estimated Length of Stay (including stay in the ER receiving treatment): 3-5  Medical Necessity for stay:acute CVA, need pt/ot eval, HTN treatment     Allergies:   No Known Allergies    Physical Exam:    height is 1.524 m (5') and weight is 65.3 kg (143 lb 15.4 oz). Her temporal artery temperature is 97.6 F (36.4 C). Her blood pressure is 160/79 and her pulse is 87. Her respiration is 16 and oxygen saturation is 98%.   Body mass index is 28.12 kg/(m^2).  Filed Vitals:    03/11/13 1921 03/11/13 2200 03/12/13 0200 03/12/13 0600   BP: 121/78 120/76 107/62 160/79   Pulse:       Temp: 98.2 F (36.8 C)  98.2 F (36.8 C) 97.6 F (36.4 C) 97.6 F (36.4 C)   TempSrc: Temporal Artery Temporal Artery Temporal Artery Temporal Artery   Resp: 17 16 18 16    Height:       Weight:       SpO2: 98% 96% 97% 98%     Intake and Output Summary (Last 24 hours) at Date Time    Intake/Output Summary (Last 24 hours) at 03/12/13 0953  Last data filed at 03/12/13 0900   Gross per 24 hour   Intake    600 ml   Output    900 ml   Net   -300 ml       Constitutional: Patient is oriented to person, place, and time. Patient appears well-developed and well-nourished.   Head: Normocephalic and atraumatic.  Eyes- pupils equal and reactive, extraocular eye movements intact, sclera anicteric  Ears - external ear canals normal, right ear normal, left ear normal  Nose - normal and patent, no erythema, discharge or polyps and normal nontender sinuses  Mouth - mucous membranes moist, pharynx normal without lesions  Neck: Normal range of motion. Neck supple. No JVD present. No tracheal deviation present. No thyromegaly present.   Cardiovascular: Normal rate, regular rhythm, normal heart sounds and  intact distal pulses.  Exam reveals no gallop and no friction rub. No murmur heard.  Pulmonary/Chest: Effort normal and breath sounds normal. No stridor. No respiratory distress. Patient has no wheezes. No rales were present.  Exhibits no tenderness.   Abdominal: Soft. Bowel sounds are normal. Patient exhibits no distension and no mass was palpable. There is no tenderness. There is no rebound and no guarding.   Musculoskeletal: Normal range of motion. Normal passive rom.  Weakness of the left side 4/5 upper and lower extremities   Lymphadenopathy:  Patient has no cervical adenopathy.   Neurological: Patient is alert and oriented to person, place, and time and has normal reflexes. No cranial nerve deficit.  Normal muscle tone. Coordination normal.   Skin: Skin is warm. No rash noted. Patient is not diaphoretic. No erythema. No pallor.   Psychiatric: Has  normal mood and affect. Behavior is normal. Judgment and thought content normal.    Consult Input/Plan     Plan  None    Medications:     Current Facility-Administered Medications   Medication Dose Route Frequency Last Rate Last Dose   . acetaminophen (TYLENOL) tablet 650 mg  650 mg Oral Q4H PRN   650 mg at 03/11/13 0842   . aspirin chewable tablet 324 mg  324 mg Oral Daily   324 mg at 03/11/13 1051   . enoxaparin (LOVENOX) syringe 40 mg  40 mg Subcutaneous Daily   40 mg at 03/11/13 1051   . glimepiride (AMARYL) tablet 4 mg  4 mg Oral QAM W/BREAKFAST   4 mg at 03/12/13 0803   . insulin aspart (NovoLOG) injection 3-12 Units  3-12 Units Subcutaneous TID AC   3 Units at 03/11/13 2111   . insulin glargine (LANTUS) injection 10 Units  10 Units Subcutaneous QHS   10 Units at 03/11/13 2111   . insulin regular (HumuLIN,NovoLIN) injection 4 Units  4 Units Intravenous Once       . labetalol (NORMODYNE,TRANDATE) injection 20 mg  20 mg Intravenous TID PRN       . lisinopril (PRINIVIL,ZESTRIL) tablet 5 mg  5 mg Oral Daily   5 mg at 03/11/13 1051   . morphine injection 2 mg  2 mg Intravenous Q4H PRN   2 mg at 03/11/13 2119   . ondansetron (ZOFRAN) injection 4 mg  4 mg Intravenous Once PRN       . pneumococcal vaccine-23 (PNEUMOVAX 23) injection 0.5 mL  0.5 mL Subcutaneous Prior to discharge       . pravastatin (PRAVACHOL) tablet 20 mg  20 mg Oral QHS   20 mg at 03/11/13 2112   . sitaGLIPtin (JANUVIA) tablet 50 mg  50 mg Oral Daily   50 mg at 03/11/13 1051     Review of Systems:   A comprehensive review of systems has no changes since H&P was obtained except as mentioned in the subjective section.    Labs:     Results     Procedure Component Value Units Date/Time    Cell MorpHology [id number]  (Abnormal) Collected:03/12/13 0615     Cell Morphology: Abnormal (A) Updated:03/12/13 0733     Microcytic =2+ (A)      Hypochromia =2+ (A)      Ovalocytes =1+ (A)      Platelet Estimate Normal     CBC without differential [id number]   (Abnormal) Collected:03/12/13 0615    Specimen Information:Blood / Blood Updated:03/12/13 0733     WBC 6.54  RBC 5.66 (H)      Hgb 12.1 g/dL      Hematocrit 38.3 %      MCV 67.7 (L) fL      MCH 21.4 (L) pg      MCHC 31.6 (L) g/dL      RDW 14 %      Platelets 196      MPV 10.8 fL     POCT Glucose [[id number]]  (Abnormal) Collected:03/12/13 0727     POCT Glucose WB 159 (A) mg/dL Updated:03/12/13 123456    Basic Metabolic Panel [id number]  (Abnormal) Collected:03/12/13 0615    Specimen Information:Blood Updated:03/12/13 0638     Glucose 141 (H) mg/dL      BUN 12.4 mg/dL      Creatinine 0.7 mg/dL      CALCIUM 9.3 mg/dL      Sodium 141      Potassium 3.8      Chloride 108 (H)      CO2 27      Anion Gap 6.0     GFR [[id number]] Collected:03/12/13 0615     EGFR >60.0 Updated:03/12/13 0638    POCT Glucose [[id number]]  (Abnormal) Collected:03/11/13 2050     POCT Glucose WB 186 (A) mg/dL Updated:03/11/13 2051    POCT Glucose [id number]  (Abnormal) Collected:03/11/13 1630     POCT Glucose WB 149 (A) mg/dL Updated:03/11/13 1711    POCT Glucose [[id number]]  (Abnormal) Collected:03/11/13 1125     POCT Glucose WB 181 (A) mg/dL Updated:03/11/13 1205          Rads:   Radiological Procedure reviewed.  Radiology Results (24 Hour)     ** No Results found for the last 24 hours. **            Time spent for evaluation, management and coordination of care:   :30 minutes      Signed by: Eugenia Pancoast  03/12/2013 9:53 AM

## 2013-03-13 LAB — POCT GLUCOSE: Whole Blood Glucose POCT: 284 mg/dL — AB (ref 70–100)

## 2013-03-13 MED ORDER — ASPIRIN 81 MG PO CHEW
324.00 mg | CHEWABLE_TABLET | Freq: Every day | ORAL | Status: DC
Start: 2013-03-13 — End: 2014-06-03

## 2013-03-13 MED ORDER — POLYETHYLENE GLYCOL 3350 17 G PO PACK
17.0000 g | PACK | Freq: Every day | ORAL | Status: DC
Start: 2013-03-13 — End: 2013-03-13
  Administered 2013-03-13: 17 g via ORAL
  Filled 2013-03-13: qty 1

## 2013-03-13 MED ORDER — GLIMEPIRIDE 4 MG PO TABS
4.0000 mg | ORAL_TABLET | Freq: Every morning | ORAL | Status: DC
Start: 2013-03-13 — End: 2016-05-16

## 2013-03-13 MED ORDER — PRAVASTATIN SODIUM 20 MG PO TABS
20.00 mg | ORAL_TABLET | Freq: Every evening | ORAL | Status: DC
Start: 2013-03-13 — End: 2014-06-03

## 2013-03-13 MED ORDER — LISINOPRIL 5 MG PO TABS
5.0000 mg | ORAL_TABLET | Freq: Every day | ORAL | Status: DC
Start: 2013-03-13 — End: 2014-06-03

## 2013-03-13 NOTE — PT Progress Note (Signed)
[facility]  [place]    Department of Rehabilitation  [phone]    Physical Therapy Daily Treatment Note    Patient: Jacqueline Weber    MRN#: [id number]     Time of Treatment: Start Time: 1149 Stop Time: 1215 Time Calculation (min): 26 min    PT Visit Number: 4    Patient's medical condition is appropriate for Physical Therapy intervention at this time.      Precautions  Other Precautions: fall risk    Subjective: Patient is agreeable to participation in the therapy session. Nursing clears patient for therapy.   Pain Assessment  Pain Assessment: No/denies pain     Objective:  Observation of Patient/Vital Signs:  Patient is in bed with telemetry and peripheral IV in place.  Pt's daughter present to translate for pt.          Functional Mobility  Supine to Sit: Stand by assistance. Increased time and effort to perform. Pt had several loss of balance to left before completing transfer to upright sitting.   Scooting: Stand by assistance  Sit to Stand: Stand by assistance. Pt instructed to place hands on bed/arms of chair for sit<>stand transfers.   Stand to Sit: Stand by assistance     Locomotion  Ambulation: minimal assistance;with front-wheeled walker. Pt instructed to lift L foot to increase clearance when stepping forward; stand upright-holding head up-don't watch feet; and keep rw closer  Ambulation Distance (Feet): 80 Feet  Pattern: L foot drop;decreased cadence;decreased step length    Therapeutic Exercise  Hip Flexion: seated B 10x2, Laarom  Hip Abduction: /hip adduction 10x2 each  Knee AROM : seated laq's B 10x2  Ankle Pumps: seated heel/toe raises Rx20; L heel raise x 20, DF x 20 prom/aarom  Pt instructed in technique and pacing for there ex as documented above. Pt instructed to continue to perform ex's 2x a day, 20 reps each per HEP handout. Pt's daughter instructed in how to assist pt to perform L hip flexion and L DF.        Neuro Re-Ed  Standing Balance:  variable environment/backgrounds;patient education;standing weight shifting all planes;dynamic gait training;with assistance;with instruction;with support;minimal assistance;moderate assistance     Treatment Activities: Pt instructed in technique and performed exercises per above documentation.         Educated the patient to role of physical therapy, plan of care, goals of therapy and HEP, safety with mobility and ADLs.    Assessment:   Assessment: Decreased LE strength;Decreased endurance/activity tolerance;Impaired coordination;Decreased functional mobility;Decreased balance;Gait impairment     Progress: Progressing toward goals     Pt continues with increased fall risk secondary to above listed deficits and needs assistance for ambulation and functional standing activities. Pt would likely benefit from acute or subacute rehab to maximize rehab potential after CVA.      Plan:   Continue with Physical Therapy services to address strength, endurance and balance deficits.    Focus next session on transfers, dynamic standing balance activities, there ex and gait training to increase pt's functional independence and safety.  .  Treatment/Interventions: Exercise;Gait training;Neuromuscular re-education;Functional transfer training;Endurance training;Patient/family training;Equipment eval/education;Bed mobility   PT Frequency: 6-7x/wk     Discharge Recommendation: Acute Rehab;SNF  Equipment Recommendations: DME Recommended for Discharge: Front wheel walker

## 2013-03-13 NOTE — Discharge Instructions (Signed)
Discharge Instructions for Stroke  You have been diagnosed with stroke, also known as a brain attack. During a stroke, blood stops flowing to part of your brain. This can damage areas in the brain that control other parts of the body. Symptoms after a stroke depend on which part of the brain has been affected.  Stroke Risk Factors  Once you've had a stroke, you're at greater risk for another one. Listed below are some other factors that can increase your risk for another stroke:   High blood pressure   High blood cholesterol   Cigarette or cigar smoking   Diabetes   Carotid or other artery disease   Atrial fibrillation, atrial flutter,or other heart disease   Physical inactivity   Obesity   Certain blood disorders (such as sickle cell anemia)   Excessive alcohol use   Abuse of illegal drugs   Race   Gender   Diet high in salty, fried, or greasy foods  Changes in Daily Living  Performing your regular tasks may be difficult after you've had a stroke, but you can learn new ways to manage your daily activities. In fact, doing daily activities may help you to regain muscle strength and bring back function to affected limbs. Be patient, give yourself time to adjust, and appreciate the progress you make.  Daily Activities     You may be at risk of falling. Make changes to your home to help you walk more easily. A therapist will decide if you need an assistive device to walk safely.   You may need to see an occupational or physical therapist to learn new ways of doing things. For example, you may need to make adjustments when bathing or dressing.   Try the following tips for showering or bathing:   Test the water temperature with a hand or foot that was not affected by the stroke.   Use grab bars, a shower seat, a hand-held showerhead, and a long-handled brush.   Try the following tips for dressing:   Dress while sitting, starting with the affected side or limb.   Wear shirts that pull easily over  your head and pants or skirts with elastic waistbands.   Use zippers with loops attached to the pull tabs.  Lifestyle Changes   Take your medications exactly as directed. Don't skip doses.   Change your diet if your doctor tells you to. Your doctor may suggest that you cut back on salt. If so, here are some tips:   Limit canned, dried, packaged, and fast foods. These tend to be high in salt.   When you cook, season foods with herbs instead of salt.   Don't add salt to your food at the table.   Begin an exercise program. Ask your doctor how to get started. You can benefit from simple activities such as walking or gardening.   Have no more than 2 alcoholic drinks a day.   Know your cholesterol level. Follow your doctor's recommendations about how to keep cholesterol under control.   If you are a smoker, break the smoking habit. Enroll in a stop-smoking program to improve your chances of success. Ask your doctor about medications or other methods to help you quit.  Follow-Up   Keep your medical appointments. Close follow-up is important to stroke rehabilitation and recovery.   Some medications require blood tests to check for progress or problems. Keep follow-up appointments for any blood tests ordered by your doctors.    When to   Seek Medical Care  Call 911 right awayif you have any of the following:   Weakness, tingling, or loss of feeling on one side of your face or body   Sudden double vision or trouble seeing in one or both eyes   Sudden trouble talking or slurred speech   Trouble understanding others   Sudden, severe headache   Dizziness, loss of balance, or a sense of falling   Blackouts    [address], [address], [place] [address]. All rights reserved. This information is not intended as a substitute for professional medical care. Always follow your healthcare professional's instructions.    [email or web address] Program    Education about New Medications and their Side  effects    Dear [name],    Its been a pleasure taking care of you during your hospitalization here at [facility]. We have initiated a new program to educate our patients and/or their family members or designated personnel about the new medications started by your physicians and their indications along with the possible side effects. Multiple studies have shown that patients started on new medications are often unaware of the names of the medication along with the indications and their side effects which leads to decreased compliance with the medications.    During our conversation today on 03/13/2013  2:23 PM I have explained to you the name of the new medication and the indication along with some possible common side effects. Listed below are some of the new medications started during this hospitalization.     Please call the Nurse if you have any side effects while in hospital.     Please call 911 if you have any life threatening symptoms after you are discharged from the hospital.    Please inform your Primary care physician for common side effects which are not life threatening after discharge.    Medication: Pravastatin(Pravachol)   This Medication is used for:   Heart Attack   Stroke   High Cholesterol    Common Side Effects are:   Nausea   Muscle Pain   Elevated liver function tests    A note from your nurse:  Call your nurse immediately if you notice itching, hives, swelling or trouble breathing     Medication Name: Lisinopril(Zestril)   This Medication is used for:   Blood Pressure   Congestive Heart Failure     Common Side Effects are:   Dizziness   Low Blood Pressure    Cough   Increased Potassium     A note from your Nurse:  Call your nurse immediately if you notice itching, hives, swelling or trouble breathing     Medication Name: Glimiperide(Amaryl)   This Medication is used for:   Diabetes    Common Side Effects are:   Low Blood sugars(Weakness, Fatigue and rapid  heart rate)    A note from your nurse:  Call your nurse immediately if you notice itching, hives, swelling or trouble breathing       Thank you for your time.    Eugenia Pancoast, MD  03/13/2013  2:23 PM  [facility]  [address]  [place], [place]  [address]

## 2013-03-13 NOTE — Progress Notes (Signed)
Assessment:    1. CVA, improving.   2. Diabetes.   3. Hypertension.   4. Hyperlipidemia.    Plan:    1. Continue aspirin.   2. OT/PT.   3. Will need Rehab, awaiting placement.      Subjective:  Daughter reports improvement. No new complaints.    Objective:  Last 24 Hour Vital Signs:  Temp:  [96.8 F (36 C)-98.6 F (37 C)] 98.3 F (36.8 C)  Heart Rate:  [71-97] 89   Resp Rate:  [16-20] 16   BP: (113-163)/(72-78) 147/72 mmHg    Physical Exam:    Awake, follows commands.   Pupils reactive, EOMI, decreased left NLF.   LUE 4-/5, LLE 4+/5, right side 5/5.      Scheduled Meds:  Current Facility-Administered Medications   Medication Dose Route Frequency   . aspirin  324 mg Oral Daily   . enoxaparin  40 mg Subcutaneous Daily   . glimepiride  4 mg Oral QAM W/BREAKFAST   . insulin aspart  3-12 Units Subcutaneous TID AC   . insulin glargine  10 Units Subcutaneous QHS   . insulin regular  4 Units Intravenous Once   . [COMPLETED] ketorolac  15 mg Intravenous Once   . lisinopril  5 mg Oral Daily   . pravastatin  20 mg Oral QHS   . sitaGLIPtin  50 mg Oral Daily       Continuous Infusions:       PRN Meds:  acetaminophen, labetalol, morphine, ondansetron, [COMPLETED] pneumococcal vaccine-23    Last 24 Hour Labs:  Results     Procedure Component Value Units Date/Time    POCT Glucose [id number]  (Abnormal) Collected:03/12/13 2200     POCT Glucose WB 196 (A) mg/dL Updated:03/12/13 2250    POCT Glucose [id number]  (Abnormal) Collected:03/12/13 1649     POCT Glucose WB 131 (A) mg/dL Updated:03/12/13 1650    POCT Glucose [[id number]]  (Abnormal) Collected:03/12/13 1119     POCT Glucose WB 274 (A) mg/dL Updated:03/12/13 1119

## 2013-03-13 NOTE — OT Progress Note (Signed)
[facility]  [place]  [phone]    Occupational Therapy Treatment Note       Patient:  Jacqueline Weber MRN#:  [id number]  [place] CARE UNIT [id number]/[id number]    Time of treatment: Start Time: 0938 Stop Time: 1020   Time Calculation (min): 42 min    OT Visit Number: 4    Precautions and Contraindications:  Falls Risk       Subjective: Patient's medical condition is appropriate for Occupational Therapy intervention at this time.  Patient is agreeable to participation in the therapy session. Nursing clears patient for therapy.       Objective:Observation of Patient/Vital Signs:  Patient is seated in arm-chair with telemetry and peripheral IV in place. Patient's daughter present for duration of session to assist with translating as per Patient's preference.     Cognition  Arousal/Alertness: Appropriate responses to stimuli  Attention Span: Appears intact  Memory: Appears intact  Following Commands: Follows all commands and directions without difficulty  Safety Awareness: minimal verbal instruction  Insights: Fully aware of deficits    Functional Mobility  Sit to Stand Transfers:  (Min A from arm-chair)  Stand to Sit Transfers:  (Min A for eccentric control)  Arm-chair<-->ToiletTransfers: minimal assistance (w/ rw); increased time and effort needed to perform; noted difficulty with advancing LLE.   Toilet Transfers: minimal assistance; verbal instructions to use grab bar using R hand to assist with ascending/descending from toilet.     Self-care and Home Management  Grooming: minimal assistance;standing with assistive device;wash/dry hands (Min A for steadying with reaching for paper towels from disp)  LE Dressing: standby assistance;sitting;Don/doff R sock;Don/doff L sock  Toileting: minimal assistance;clothing management up;clothing management down;perineal hygiene (Patient needed assistance to manage gown while she  performed underwear/pants mgmt up/down over hips. Increased time and effort to perform due to decreased functional reach L hand).    Therapeutic Exercises:Patient guided thru and performed the following UB AROM exercises to faciltate increased endurance and strength for ADL's:  Shoulder AROM: Extension;Flexion; 1 x 10 reps  Elbow AROM: Flexion;Extension 1 x 10 reps  L Hand ROM: grasp and release;finger opposition; 1 x 10 reps; (decreased index finger flexion by approx. 1 inch from [facility]; middle, ring and little fingers AROM WNL's.  Wrist/Hand Strengthening:  (blue resistance sponge for gross grasp)                             Treatment Activities: Educated the patient to role of occupational therapy, plan of care, goals of therapy and HEP, safety with mobility and ADLs, home safety.Patient engaged in a table top activity in seated and standing positions focusing on increasing L hand AROM/coordination picking up small objects of various shapes and sizes. Needed instructions to maintain L elbow at side of body due to Patient demonstrating L shoulder abduction with picking up items from tabletop. Pt. Instructed to perform B/L UE AROM exercises for shoulder, elbow and digit F/E intermittently throughout the day to increase endurance and strength for ADL's. Patient appeared receptive to same.     Patient left without needs and call bell within reach. RN notified of session outcome.    Assessment:Patient presents with: decreased strength;balance deficits;decreased independence with ADLs;decreased safety awareness;decreased ROM;sensory impairment;decreased independence with IADLs. Patient with improved L hand coordination and gross digit flexion.    Prognosis: Good;With continued OT s/p acute discharge  Patient Goal  Patient Goal:  (to go to rehab)      Plan: Continue with Occupational therapy services in acute care to address increasing independence and safety with ADL's.. Focus next therapy session on L hand/UE motor  control/ROM/coordination; LB ADL's.      OT Plan  Treatment Interventions: ADL retraining;Functional transfer training;UE strengthening/ROM;Endurance training;Patient/Family training;Neuro muscular reeducation;Fine motor coordination activities  Progress: Progressing toward goals  Discharge Recommendation: Acute Rehab  DME Recommended for Discharge:  (to be determined at rehab)  OT Frequency Recommended: 3-4x/wk  OT - Next Visit Recommendation: 03/14/13                                      Discharge Recommendation: Acute Rehab  DME Recommended for Discharge:  (to be determined at rehab)        Hewitt Blade. Rigoberto Noel, MS,OTR/L  Pager # [phone]  [phone]

## 2013-03-13 NOTE — Progress Notes (Signed)
[facility] Hospitalist Daily Progress Note        Date Time: 03/13/2013  1:50 PM  Patient Name:Jacqueline Weber  [id number]  PCP: Levon Hedger, MD  Attending Physician:Norvil Martensen Allene Dillon M.D.      Chief Complaint:      Chief Complaint   Patient presents with   . Stroke       Subjective:   Says feeling better today, strength is improving.     Assessment/Plan   62 yo female here with acute CVA of the right pons     Diagnosis: Principal Problem:   *CVA (cerebral vascular accident)  Active Problems:   Type 2 diabetes mellitus with other manifestations   HTN (hypertension) urgency   Hyperlipidemia    1. 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 Uncontrol HTN   - currently on lisinopril 5mg , blood pressure is acceptable,   3. DM II  - currently on januvia, lantus, amaryl  - will need better blood sugar control as an outpatient   4. Hyperlipidemia - on pravastatin   5. Discussed with pt regarding acute stroke and increased risk of having repeat stroke, advice her to f/u with pcp on discharge to aggressive manage HTN, DM and hyperlipidemia for secondary prevention      DVT Prohylaxis:lovenox   Code Status: No Order   Disposition: acute rehab  Prognosis:good  type of Admission:Inpatient  Estimated Length of Stay (including stay in the ER receiving treatment): 3-5  Medical Necessity for stay:acute CVA, pending rehab, HTN treatment     Allergies:   No Known Allergies    Physical Exam:    height is 1.524 m (5') and weight is 65.5 kg (144 lb 6.4 oz). Her temporal artery temperature is 97.9 F (36.6 C). Her blood pressure is 111/71 and her pulse is 105. Her respiration is 14 and oxygen saturation is 97%.   Body mass index is 28.20 kg/(m^2).  Filed Vitals:    03/12/13 2200 03/13/13 0200 03/13/13 0600 03/13/13 1000   BP:  129/72 147/72 111/71   Pulse:  72 89 105   Temp:  97.8 F (36.6 C)  98.3 F (36.8 C) 97.9 F (36.6 C)   TempSrc: Temporal Artery Temporal Artery Temporal Artery    Resp: 16   14   Height:       Weight:   65.5 kg (144 lb 6.4 oz)    SpO2:         Intake and Output Summary (Last 24 hours) at Date Time    Intake/Output Summary (Last 24 hours) at 03/13/13 1350  Last data filed at 03/13/13 1147   Gross per 24 hour   Intake    420 ml   Output    700 ml   Net   -280 ml       Constitutional: Patient is oriented to person, place, and time. Patient appears well-developed and well-nourished.   Head: Normocephalic and atraumatic.  Eyes- pupils equal and reactive, extraocular eye movements intact, sclera anicteric  Ears - external ear canals normal, right ear normal, left ear normal  Nose - normal and patent, no erythema, discharge or polyps and normal nontender sinuses  Mouth - mucous membranes moist, pharynx normal without lesions  Neck: Normal range of motion. Neck supple. No JVD present. No tracheal deviation present. No thyromegaly present.   Cardiovascular: Normal rate, regular rhythm, normal heart sounds and  intact distal pulses.  Exam reveals no gallop and no friction rub. No murmur heard.  Pulmonary/Chest: Effort normal and breath sounds normal. No stridor. No respiratory distress. Patient has no wheezes. No rales were present.  Exhibits no tenderness.   Abdominal: Soft. Bowel sounds are normal. Patient exhibits no distension and no mass was palpable. There is no tenderness. There is no rebound and no guarding.   Musculoskeletal: Normal range of motion. Normal passive rom.  Weakness of the left side 4/5 upper and lower extremities   Lymphadenopathy:  Patient has no cervical adenopathy.   Neurological: Patient is alert and oriented to person, place, and time and has normal reflexes. No cranial nerve deficit.  Normal muscle tone. Coordination normal.   Skin: Skin is warm. No rash noted. Patient is not diaphoretic. No erythema. No pallor.   Psychiatric: Has normal mood and affect.  Behavior is normal. Judgment and thought content normal.    Consult Input/Plan     Plan  None    Medications:     Current Facility-Administered Medications   Medication Dose Route Frequency Last Rate Last Dose   . acetaminophen (TYLENOL) tablet 650 mg  650 mg Oral Q4H PRN   650 mg at 03/11/13 0842   . aspirin chewable tablet 324 mg  324 mg Oral Daily   324 mg at 03/13/13 0953   . enoxaparin (LOVENOX) syringe 40 mg  40 mg Subcutaneous Daily   40 mg at 03/13/13 0953   . glimepiride (AMARYL) tablet 4 mg  4 mg Oral QAM W/BREAKFAST   4 mg at 03/13/13 0838   . insulin aspart (NovoLOG) injection 3-12 Units  3-12 Units Subcutaneous TID AC   9 Units at 03/13/13 1205   . insulin glargine (LANTUS) injection 10 Units  10 Units Subcutaneous QHS   10 Units at 03/12/13 2239   . insulin regular (HumuLIN,NovoLIN) injection 4 Units  4 Units Intravenous Once       . labetalol (NORMODYNE,TRANDATE) injection 20 mg  20 mg Intravenous TID PRN       . lisinopril (PRINIVIL,ZESTRIL) tablet 5 mg  5 mg Oral Daily   5 mg at 03/13/13 0953   . morphine injection 2 mg  2 mg Intravenous Q4H PRN   2 mg at 03/12/13 2242   . ondansetron (ZOFRAN) injection 4 mg  4 mg Intravenous Once PRN       . pravastatin (PRAVACHOL) tablet 20 mg  20 mg Oral QHS   20 mg at 03/12/13 2239   . sitaGLIPtin (JANUVIA) tablet 50 mg  50 mg Oral Daily   50 mg at 03/13/13 [id number]     Review of Systems:   A comprehensive review of systems has no changes since H&P was obtained except as mentioned in the subjective section.    Labs:     Results     Procedure Component Value Units Date/Time    POCT Glucose [id number]  (Abnormal) Collected:03/13/13 1147     POCT Glucose WB 284 (A) mg/dL Updated:03/13/13 1147    POCT Glucose [[id number]]  (Abnormal) Collected:03/12/13 2200     POCT Glucose WB 196 (A) mg/dL Updated:03/12/13 2250    POCT Glucose [id number]  (Abnormal) Collected:03/12/13 1649     POCT Glucose WB 131 (A) mg/dL Updated:03/12/13 1650          Rads:   Radiological Procedure  reviewed.  Radiology Results (24 Hour)     ** No Results found for the last 24 hours. **  Time spent for evaluation, management and coordination of care:   :25 minutes      Signed by: Eugenia Pancoast  03/13/2013 1:50 PM

## 2013-03-13 NOTE — SLP Progress Note (Signed)
[facility]  [place], [place]  [phone]    Speech Language and Pathology Therapy Treatment Note       Patient:  Jacqueline Weber MRN#:  [id number]  [place] CARE UNIT M221/[id number]    Time of treatment: SLP Received On: 03/13/13  Start Time: 1020  Stop Time: 1045  Time Calculation (min): 25 min    Precautions and Contraindications: Fall      Subjective:  Patient's medical condition is appropriate for Speech Language Pathology Therapy intervention at this time.  Patient pleasant and cooperative.    Objective:   Patient participated in swallowing therapy his am, in which no problems noted with solids nor thin liquids. Patient demonstrated active mastication of hard solids and no s/s of aspiration with thin liquids. Patient's lingual protrusion is symmetrical/midline, however left side facial weakness was noted during labial retraction. At rest, patient's labial symmetry is [facility], however during retraction exercise, patient demonstrated 80-90% facial symmetry.    Family and patient education includes education on left side facial exercises.    Assessment:Patient continues to progress well with swallowing of a Regular diet with thin liquids, however patient presents with mild facial asymmetry warranting OME's.     Patient left without needs and call bell within reach. RN notified of session outcome.        Plan: OME's    Joellen Jersey Rogen Porte, MS,CCC,SLP

## 2013-03-13 NOTE — Discharge Summary (Signed)
[facility] Hospitalist Discharge Note      Date Time: 03/13/2013  2:24 PM  Patient Name:Jacqueline Weber  OE:[id number]  PCP: Levon Hedger, MD  Attending Physician:Kai Calico Allene Dillon M.D.    Hospital Course:   Please see H&P for complete details of HPI and ROS. The patient was admitted to [facility] and has been diagnosed with the following conditions and has been taken care as mentioned below.  62 yo lady admitted Hx DM admitted for the evaluation of the Left sided numbness and weakness started overnight. She was recently discharged from ER after treating for uncontrolled DM. Since then patient continues to have ongoing symptoms of dizziness and starting of the numbness of the left side. Associated with balance problems.  1. 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 Uncontrol HTN   On lisinopril 5mg  with bp acceptable, will need to continue to adjust blood pressure meds as outpatient to meet goal  3. DM II   Pt will be discharged on metformin and glimiperide, further adjustment is needed as outpatient  4. Hyperlipidemia - on pravastatin   5. Discussed with pt regarding acute stroke and increased risk of having repeat stroke, advice her to f/u with pcp on discharge to aggressive manage HTN, DM and hyperlipidemia for secondary prevention      Patient Active Problem List    Diagnosis Date Noted   . Hyperlipidemia 03/11/2013   . CVA (cerebral vascular accident) 03/10/2013   . R/o CVA 03/09/2013   . Type 2 diabetes mellitus with other manifestations 03/09/2013   . Hypertensive emergency 03/09/2013   . HTN (hypertension) urgency 03/09/2013       Type of Admission:inpatient   Medical Necessity for stay:acute cva require acute rehab    Date of Admission:   03/09/2013    Date of Discharge:   03/13/2013    Chief Complaint:      Chief Complaint   Patient presents with   .  Stroke       Discharge Diagnosis:   Hospital Problems:  Principal Problem:   *CVA (cerebral vascular accident)  Active Problems:   Type 2 diabetes mellitus with other manifestations   HTN (hypertension) urgency   Hyperlipidemia      Lists the present on admission hospital problems  Present on Admission:   . CVA (cerebral vascular accident)  . Hyperlipidemia        Consult Input/Plan     Plan  None    Procedures performed:   none    Physical Exam:    height is 1.524 m (5') and weight is 65.5 kg (144 lb 6.4 oz). Her temporal artery temperature is 97.9 F (36.6 C). Her blood pressure is 126/69 and her pulse is 92. Her respiration is 16 and oxygen saturation is 97%.   Body mass index is 28.20 kg/(m^2).  Filed Vitals:    03/13/13 0200 03/13/13 0600 03/13/13 1000 03/13/13 1400   BP: 129/72 147/72 111/71 126/69   Pulse: 72 89 105 92   Temp: 97.8 F (36.6 C) 98.3 F (36.8 C) 97.9 F (36.6 C) 97.9 F (36.6 C)   TempSrc: Temporal Artery Temporal Artery     Resp:   14 16   Height:       Weight:  65.5 kg (144 lb 6.4 oz)  SpO2:         Intake and Output Summary (Last 24 hours) at Date Time    Intake/Output Summary (Last 24 hours) at 03/13/13 1424  Last data filed at 03/13/13 1147   Gross per 24 hour   Intake    420 ml   Output    700 ml   Net   -280 ml         Labs:     Results     Procedure Component Value Units Date/Time    POCT Glucose [id number]  (Abnormal) Collected:03/13/13 1147     POCT Glucose WB 284 (A) mg/dL Updated:03/13/13 1147    POCT Glucose [[id number]]  (Abnormal) Collected:03/12/13 2200     POCT Glucose WB 196 (A) mg/dL Updated:03/12/13 2250    POCT Glucose [id number]  (Abnormal) Collected:03/12/13 1649     POCT Glucose WB 131 (A) mg/dL Updated:03/12/13 1650            Rads:   Radiological Procedure reviewed.  Echocardiogram Adult Complete W Clr/ Dopp Waveform    03/10/2013  ECHOCARDIOGRAM  Sonographer:  Denton Ar Technical Quality: Difficult but adequate Indications:  TIA, hypertension  Height (in):   60 Weight (lb):  141 Blood Pressure:  127/78    2-D Measurements  Left Ventricle                                           Diastolic Dimension:  30  ([id number] mm) Systolic Dimension:  16  (25-40 mm)      Septal Diastolic Thickness:  11  (6-11 mm)     Posterior Wall Thickness:  10  (6-11 mm)  Fractional Shortening Percentage:  46  (24-46 %)                        Visually Estimated Ejection Fraction:  Greater than 75  (55-75 %)                           Right Ventricle Diastolic Dimension:  28  (7-26 mm)                             Left Atrium End Systolic Dimension:  24  (19-40 mm)                      Aortic Root:  32  (20-37 mm)                         Doppler Measurements and Color Flow Imaging Valves                                          Aortic Valve:  1.7  (0.9-1.8 m/s).          Regurgitation:  Trace  Pulmonic Valve:  Not measured  (0.6-0.9 m/s).      Regurgitation:  None  Mitral Valve:  0.5  (0.6-1.4 m/s).           Regurgitation:  None  Tricuspid Valve:  0.6  (0.4-0.8 m/s.      Regurgitation:  None  Left Ventricular Outflow Tract Velocity:  3.5  m/s.   E/A Ratio:  0.5 Est. PASP:    Est. RA Mean Pressure:         03/10/2013    1.   The quality of the study is technically difficult but adequate for interpretation. 2.   The left ventricle is small in size. Left ventricular systolic function is hyperdynamic. There are no regional wall motion abnormalities. Estimated EF is >75%.        There is no left ventricular hypertrophy. The E to A ratio of the mitral diastolic flow is 0.5, which indicates grade I abnormal diastolic LV function.  Velocity in the left ventricular outflow track is increased at 3.5 m/s, giving a left ventricular outflow gradient of 48 mmHg the patient could not cooperate for Valsalva maneuver 3.  The left atrium is normal in size. 4.  The aortic valve is not well seen. Adequate valve excursion is present. There is no aortic insufficiency. No aortic stenosis is present. The aortic root is normal  in size. 5.   The mitral valve is structurally intact. No mitral regurgitation  is present. 6.   The right ventricle is normal in size and function. 7.   The right atrium is normal in size. 8.   The tricuspid valve is structurally intact. There is no tricuspid insufficiency. 9.   The pulmonic valve is structurally intact. There is no pulmonic insufficiency. 10. IV systolic pressure was not estimated due to inadequate tricuspid regurgitation quantity 11. Nopericardial effusion, intracardiac thrombi, or masses are seen. 12. The atrial septum is structurally intact. No shunt by color flow doppler.   CONCLUSION:   1. Technically difficult but adequate study for interpretation 2. Hyperdynamic left ventricular function with an EF of greater than 75% 3. Increased left ventricular outflow tract velocity causing a gradient of 48 mmHg number  4. Adequately functioning cardiac valves  Gavin Pound, MD  03/10/2013 5:52 PM     Ct Head Wo Contrast    03/11/2013  HISTORY: Left upper extremity weakness.  TECHNIQUE: Axial CT images of the head were obtained, without intravenous contrast.  COMPARISON: Prior head CT and MRI dated 03/09/2013.  FINDINGS: The known acute infarct involving the right ventral pons is not well seen on this examination. No acute infarct identified elsewhere. There is no mass effect or midline shift. There is no evidence of an acute intracranial hemorrhage or territorial infarct. The osseous structures are unremarkable.          03/11/2013   The known infarct involving the right ventral pons is not well seen on this examination. There has not been a significant interval change.  Shelby Dubin, MD  03/11/2013 8:29 AM     Ct Head Wo Contrast    03/09/2013  HISTORY: Blurred vision and left-sided weakness.  COMPARISON: None.  FINDINGS:     The ventricles are normal in appearance. No acute infarct is noted. No intracranial hemorrhage is seen. The paranasal sinuses and mastoid air cells appear clear. Internal carotid and  vertebral artery calcifications are visualized.       03/09/2013   No acute intracranial abnormality is seen.        Marcos Eke, MD  03/09/2013 12:02 PM     Mra Head Wo Contrast    03/09/2013  History: Stroke.  Findings: MRA of the neck without and following administration of 10 cc of Gadavist, unenhanced MRA of the head. Determination of the proximal internal carotid artery narrowing was performed utilizing distal internal  carotid artery diameter as a denominator. Correlation with a brain MRI performed at the same time.  There is minimal irregularity involving the region of the right common carotid artery bifurcation consistent with minor atherosclerotic plaque. This results in minor, approximately 10% narrowing of the proximal right internal carotid artery. There is mild narrowing of the supraclinoid right internal carotid artery. 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  The left common carotid artery, the cervical and intracranial left internal carotid arteries are normal appearing.  The left vertebral artery is a large caliber dominant vessel which is normal appearing. The right vertebral artery appears very small in caliber and demonstrates segments of decreased signal consistent with slow flow. Segmental occlusions are not entirely excluded. This vessel is likely congenitally small in caliber based on its appearance on the concurrently performed brain MRI. The basilar artery is normal in caliber. The proximal visualized portions of the anterior, middle and posterior cerebral arteries are unremarkable.      03/09/2013   1. Minor atherosclerotic changes in the proximal and distal right internal carotid artery. 2. 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 It demonstrates segments of decreased flow-related signal. This is consistent with slow flow and a very small caliber of the vessel. Small segmental occlusions are not excluded.  Urgent results were  discussed with the hospitalist caring for the patient on 03/09/2013 11:21 PM.  Dianne Dun, MD  03/09/2013 11:22 PM     Mra Neck W Wo Contrast    03/09/2013  History: Stroke.  Findings: MRA of the neck without and following administration of 10 cc of Gadavist, unenhanced MRA of the head. Determination of the proximal internal carotid artery narrowing was performed utilizing distal internal carotid artery diameter as a denominator. Correlation with a brain MRI performed at the same time.  There is minimal irregularity involving the region of the right common carotid artery bifurcation consistent with minor atherosclerotic plaque. This results in minor, approximately 10% narrowing of the proximal right internal carotid artery. There is mild narrowing of the supraclinoid right internal carotid artery. 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  The left common carotid artery, the cervical and intracranial left internal carotid arteries are normal appearing.  The left vertebral artery is a large caliber dominant vessel which is normal appearing. The right vertebral artery appears very small in caliber and demonstrates segments of decreased signal consistent with slow flow. Segmental occlusions are not entirely excluded. This vessel is likely congenitally small in caliber based on its appearance on the concurrently performed brain MRI. The basilar artery is normal in caliber. The proximal visualized portions of the anterior, middle and posterior cerebral arteries are unremarkable.      03/09/2013   1. Minor atherosclerotic changes in the proximal and distal right internal carotid artery. 2. 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 It demonstrates segments of decreased flow-related signal. This is consistent with slow flow and a very small caliber of the vessel. Small segmental occlusions are not excluded.  Urgent results were discussed with the hospitalist caring  for the patient on 03/09/2013 11:21 PM.  Dianne Dun, MD  03/09/2013 11:22 PM     Mri Brain W Wo Contrast    03/09/2013  History: Stroke.  FINDINGS: Brain MRI without and following administration of intravenous contrast. Correlation with a brain CT performed earlier the same day.  There is an approximately 9 x 8  mm focus of diffusion restriction consistent with an acute infarction involving the anterior aspect of the pons on the right side. Small foci of T2 prolongation are seen scattered in the supratentorial white matter consistent with mild-to-moderate chronic small vessel ischemic disease. There is no mass effect, MR evidence of an acute intracranial hemorrhage. The ventricular system and cisterns are normally configured. There is no abnormal enhancement. The major vascular flow voids are normally maintained. 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      03/09/2013   1. Acute infarction anteriorly on the right in the pons. 2. Mild to moderate chronic small vessel ischemic disease.  Urgent results were discussed with the hospitalist caring for the patient on 03/09/2013 11:21 PM.  Dianne Dun, MD  03/09/2013 11:22 PM     Chest Ap Portable    03/09/2013  Clinical history: Stroke.  COMPARISON: None.  Chest, AP portable: The osseous and soft tissue structures are unremarkable. The thoracic aorta is tortuous. The cardiac silhouette is at the upper limits of normal in size. The hilar silhouettes are within normal limits. The lungs are grossly clear.      03/09/2013   No acute process.  Nicholes Rough, MD  03/09/2013 1:07 PM       Discharge Medications:     Please See Discharge Medication reconciliation for the final list of medications.    Pending Labs:   none  Discharge Destination:   Acute rehab  Condition at Discharge :   Improved    Time spent for Discharge Care:   29minutes    Follow-up:     Recommended Follow up with PCP in one week.    Signed by: Eugenia Pancoast, MD

## 2013-03-13 NOTE — Progress Notes (Signed)
Nutritional Support Services  Nutrition Assessment    Jacqueline Weber 62 y.o. female   MRN: [id number]    Referral Source: LOS  Reason for Referral: LOS    Nutrition Summary/Diet history: pt adm to r/o CVA; passed SLP eval. Eating at least 50% at meals. Elevated lipids noted.    Nutrition Diagnosis:   Altered nutrition related lab values (lipids) related to diet, lifestyle, poorly managed DM, h/o HTN, HLD as evidenced by Total Cholesterol of 245, LDL 166.       Intervention:  1. Provided handout on cardiac diet.  2. Add cardiac diet restrictions to current diet order.     Goal: increase awareness to cardiac diet requirements    Monitoring:   Evaluation:   1. PO intake   adequate  2. Weights   stable    Nutrition risk level: low    Assessment Data:  Adm dx:  CVA (cerebral vascular accident)   Patient Active Problem List   Diagnosis   . R/o CVA   . Type 2 diabetes mellitus with other manifestations   . Hypertensive emergency   . HTN (hypertension) urgency   . CVA (cerebral vascular accident)   . Hyperlipidemia     PMH:  has a past medical history of Diabetes mellitus.  PSH:  has no past surgical history on file.  Pertinent labs:  Lab 03/12/13 0615 03/11/13 0637 03/10/13 0405 03/09/13 1158 03/06/13 1718   NA 141 137 138 134* 132*   K 3.8 4.2 4.1 4.3 4.1   CL 108* 104 104 99 98   CO2 27 25 25 24 27    BUN 12.4 13.7 10.9 9.5 17.3   CREAT 0.7 0.8 0.7 0.8 0.9   GLU 141* 275* 207* 322* 467*   CA 9.3 9.6 9.6 10.1 9.4   MG -- -- -- -- --   PHOS -- -- -- -- --   Tchol: 245, LDL: 166    Pertinent meds: insulin  Current Facility-Administered Medications   Medication Dose Route Frequency   . aspirin  324 mg Oral Daily   . enoxaparin  40 mg Subcutaneous Daily   . glimepiride  4 mg Oral QAM W/BREAKFAST   . insulin aspart  3-12 Units Subcutaneous TID AC   . insulin glargine  10 Units Subcutaneous QHS   . insulin regular  4 Units Intravenous Once   . lisinopril  5 mg Oral Daily   . pravastatin  20 mg Oral QHS   . sitaGLIPtin  50 mg  Oral Daily     Social history:  widowed  Food Intolerance/Religious/Ethnic preferences: n/a  Diet Order:  Orders Placed This Encounter   Procedures   . Diet consistent carbohydrate      Food intake: >50% at meals  GI symptoms: WNL  Hydration:    Skin: WNL  Anthropometrics  Height: 152.4 cm (5')  Weight: 65.5 kg (144 lb 6.4 oz)  Weight Change: 0.31   IBW/kg (Calculated) Female: 48.18 kg  IBW/kg (Calculated) Female: 45.46 kg  BMI (calculated): 28.6   Wt Readings from Last 30 Encounters:   03/13/13 65.5 kg (144 lb 6.4 oz)   03/06/13 67.586 kg (149 lb)     Learning Needs: none  No Known Allergies    Lenor Derrick MS, RD

## 2013-03-13 NOTE — Progress Notes (Signed)
[place]    Name of Receiving Facility:  [facility]   Receiving Facility Bed #: 150   Number for Floor RN to Call Report: [phone]   Family Member notified of transfer plan: Yes, daughter   Fax number of Receiving Facility: [phone]   Phone number and name of attending physician at receiving facility (MD to MD handoff):      Transportation  Mode of Transportation: family   Time of Transportation pick up: 4:00PM     SNF Only  I have completed the Medicaid Pre- Screening (UAI, DMAS 96 & 97) and faxed it via ECIN/Allscripts to the receiving facility. n/a   I have completed the DMAS 95 - MI/MR form and faxed it via ECIN/Allscripts to the receiving facility. n/a   Does the DMAS 95 - MI/MR trigger a Level 2 screening? n/a     Medicare Only  I have validated that this patient has a 3 midnight inpatient qualifying stay (SNF only). yes   I have validated that the patient has received the second Important Message from Medicare (IMM) letter. n/a     The following was routed via Epic to the Receiving Facility:  Document Routed via Epic on Day of Discharge?   History & Physical Given to [facility]   D/C order yes   Last day of PT/OT notes (if applicable) yes   D/C Summary yes   PICC Line Report (if applicable) n/a   Most Recent Chest Xray n/a   All MD Consults yes   Isolation Requirement  n/a   Urine/Wound/Blood Culture Results  n/a   Special DME required (such as wound vac, special mattress, bariatric bed if applicable) n/a   Face to face order for IV Abx (if applicable) n/a   Special wound care order if applicable n/a   Last 3 days of MD Progress Notes    Use Pasteboard option to copy & paste AVS into a progress note-(AVS to include medications and   discharge instructions) yes   Tube Feeding or TPN Order (rate & formula) (if applicable) n/a

## 2014-06-03 ENCOUNTER — Observation Stay: Payer: Charity

## 2014-06-03 ENCOUNTER — Observation Stay: Payer: Self-pay | Admitting: Family Medicine

## 2014-06-03 ENCOUNTER — Emergency Department: Payer: Charity

## 2014-06-03 ENCOUNTER — Observation Stay
Admission: EM | Admit: 2014-06-03 | Discharge: 2014-06-04 | Disposition: A | Discharge status: Home / Self Care | Payer: Charity | Attending: Internal Medicine | Admitting: Internal Medicine

## 2014-06-03 DIAGNOSIS — I639 Cerebral infarction, unspecified: Secondary | ICD-10-CM

## 2014-06-03 DIAGNOSIS — I6350 Cerebral infarction due to unspecified occlusion or stenosis of unspecified cerebral artery: Secondary | ICD-10-CM

## 2014-06-03 DIAGNOSIS — E780 Pure hypercholesterolemia: Secondary | ICD-10-CM | POA: Insufficient documentation

## 2014-06-03 DIAGNOSIS — H269 Unspecified cataract: Secondary | ICD-10-CM | POA: Insufficient documentation

## 2014-06-03 DIAGNOSIS — R269 Unspecified abnormalities of gait and mobility: Secondary | ICD-10-CM

## 2014-06-03 DIAGNOSIS — D649 Anemia, unspecified: Secondary | ICD-10-CM | POA: Insufficient documentation

## 2014-06-03 DIAGNOSIS — I160 Hypertensive urgency: Secondary | ICD-10-CM

## 2014-06-03 DIAGNOSIS — Z8673 Personal history of transient ischemic attack (TIA), and cerebral infarction without residual deficits: Secondary | ICD-10-CM | POA: Insufficient documentation

## 2014-06-03 DIAGNOSIS — E119 Type 2 diabetes mellitus without complications: Secondary | ICD-10-CM | POA: Diagnosis present

## 2014-06-03 DIAGNOSIS — R42 Dizziness and giddiness: Principal | ICD-10-CM | POA: Diagnosis present

## 2014-06-03 DIAGNOSIS — E785 Hyperlipidemia, unspecified: Secondary | ICD-10-CM | POA: Insufficient documentation

## 2014-06-03 DIAGNOSIS — R2681 Unsteadiness on feet: Secondary | ICD-10-CM | POA: Insufficient documentation

## 2014-06-03 DIAGNOSIS — I10 Essential (primary) hypertension: Secondary | ICD-10-CM | POA: Insufficient documentation

## 2014-06-03 HISTORY — DX: Essential (primary) hypertension: I10

## 2014-06-03 HISTORY — DX: Unspecified cataract: H26.9

## 2014-06-03 HISTORY — DX: Pure hypercholesterolemia, unspecified: E78.00

## 2014-06-03 LAB — IRON PROFILE
Iron Saturation: 16 % (ref 15–50)
Iron: 50 ug/dL (ref 40–145)
TIBC: 317 ug/dL (ref 265–497)
UIBC: 267 ug/dL (ref 126–382)

## 2014-06-03 LAB — URINALYSIS WITH MICROSCOPIC
Bilirubin, UA: NEGATIVE
Blood, UA: NEGATIVE
Glucose, UA: NEGATIVE
Ketones UA: NEGATIVE
Nitrite, UA: NEGATIVE
Protein, UR: NEGATIVE
RBC, UA: NONE SEEN /hpf (ref 0–5)
Specific Gravity UA: 1.035 (ref 1.001–1.035)
Urine pH: 7 (ref 5.0–8.0)
Urobilinogen, UA: 0.2 mg/dL (ref 0.2–2.0)

## 2014-06-03 LAB — CBC AND DIFFERENTIAL
Basophils Absolute Automated: 0.04 10*3/uL (ref 0.00–0.20)
Basophils Automated: 0 %
Eosinophils Absolute Automated: 0.22 10*3/uL (ref 0.00–0.70)
Eosinophils Automated: 3 %
Hematocrit: 32.3 % — ABNORMAL LOW (ref 37.0–47.0)
Hgb: 10.2 g/dL — ABNORMAL LOW (ref 12.0–16.0)
Lymphocytes Absolute Automated: 3.19 10*3/uL (ref 0.50–4.40)
Lymphocytes Automated: 40 %
MCH: 20.9 pg — ABNORMAL LOW (ref 28.0–32.0)
MCHC: 31.6 g/dL — ABNORMAL LOW (ref 32.0–36.0)
MCV: 66.3 fL — ABNORMAL LOW (ref 80.0–100.0)
MPV: 11.1 fL (ref 9.4–12.3)
Monocytes Absolute Automated: 0.51 10*3/uL (ref 0.00–1.20)
Monocytes: 6 %
Neutrophils Absolute: 3.99 10*3/uL (ref 1.80–8.10)
Neutrophils: 50 %
Platelets: 229 10*3/uL (ref 140–400)
RBC: 4.87 10*6/uL (ref 4.20–5.40)
RDW: 15 % (ref 12–15)
WBC: 7.95 10*3/uL (ref 3.50–10.80)

## 2014-06-03 LAB — COMPREHENSIVE METABOLIC PANEL
ALT: 17 U/L (ref 0–55)
AST (SGOT): 20 U/L (ref 5–34)
Albumin/Globulin Ratio: 1.2 (ref 0.9–2.2)
Albumin: 3.8 g/dL (ref 3.5–5.0)
Alkaline Phosphatase: 62 U/L (ref 37–106)
Anion Gap: 10 (ref 5.0–15.0)
BUN: 16 mg/dL (ref 7.0–19.0)
Bilirubin, Total: 0.3 mg/dL (ref 0.2–1.2)
CO2: 23 mEq/L (ref 22–29)
Calcium: 9.8 mg/dL (ref 8.5–10.5)
Chloride: 105 mEq/L (ref 100–111)
Creatinine: 0.9 mg/dL (ref 0.6–1.0)
Globulin: 3.3 g/dL (ref 2.0–3.6)
Glucose: 111 mg/dL — ABNORMAL HIGH (ref 70–100)
Potassium: 4.2 mEq/L (ref 3.5–5.1)
Protein, Total: 7.1 g/dL (ref 6.0–8.3)
Sodium: 138 mEq/L (ref 136–145)

## 2014-06-03 LAB — LIPID PANEL
Cholesterol / HDL Ratio: 2.8
Cholesterol: 147 mg/dL (ref 0–199)
HDL: 53 mg/dL (ref >40)
LDL Calculated: 74 mg/dL (ref 0–99)
Triglycerides: 100 mg/dL (ref 34–149)
VLDL Calculated: 20 mg/dL (ref 10–40)

## 2014-06-03 LAB — VITAMIN B12: Vitamin B-12: 579 pg/mL (ref 211–911)

## 2014-06-03 LAB — CELL MORPHOLOGY
Cell Morphology: ABNORMAL — AB
Platelet Estimate: NORMAL

## 2014-06-03 LAB — FERRITIN: Ferritin: 69.03 ng/mL (ref 4.60–204.00)

## 2014-06-03 LAB — FOLATE: Folate: 12.6 ng/mL

## 2014-06-03 LAB — GFR: EGFR: >60

## 2014-06-03 LAB — GLUCOSE WHOLE BLOOD - POCT
Whole Blood Glucose POCT: 116 mg/dL — ABNORMAL HIGH (ref 70–100)
Whole Blood Glucose POCT: 110 mg/dL — ABNORMAL HIGH (ref 70–100)

## 2014-06-03 LAB — TROPONIN I
Troponin I: <0.01 ng/mL (ref 0.00–0.09)
Troponin I: 0.01 ng/mL (ref 0.00–0.09)

## 2014-06-03 LAB — HEMOLYSIS INDEX: Hemolysis Index: 3 (ref 0–18)

## 2014-06-03 LAB — TSH: TSH: 0.89 u[IU]/mL (ref 0.35–4.94)

## 2014-06-03 MED ORDER — DEXTROSE 50 % IV SOLN
25.0000 mL | INTRAVENOUS | Status: DC | PRN
Start: 2014-06-03 — End: 2014-06-04

## 2014-06-03 MED ORDER — MORPHINE SULFATE 2 MG/ML IJ/IV SOLN (WRAP)
1.0000 mg | Status: DC | PRN
Start: 2014-06-03 — End: 2014-06-04

## 2014-06-03 MED ORDER — HYDRALAZINE HCL 20 MG/ML IJ SOLN
10.0000 mg | Freq: Once | INTRAMUSCULAR | Status: AC
Start: 2014-06-03 — End: 2014-06-03
  Administered 2014-06-03: 10 mg via INTRAVENOUS
  Filled 2014-06-03: qty 1

## 2014-06-03 MED ORDER — LABETALOL HCL 5 MG/ML IV SOLN
10.0000 mg | INTRAVENOUS | Status: DC | PRN
Start: 2014-06-03 — End: 2014-06-04

## 2014-06-03 MED ORDER — ACETAMINOPHEN 650 MG RE SUPP
650.0000 mg | RECTAL | Status: DC | PRN
Start: 2014-06-03 — End: 2014-06-03

## 2014-06-03 MED ORDER — GLUCAGON 1 MG IJ SOLR (WRAP)
1.0000 mg | INTRAMUSCULAR | Status: DC | PRN
Start: 2014-06-03 — End: 2014-06-04

## 2014-06-03 MED ORDER — FAMOTIDINE 20 MG PO TABS
20.0000 mg | ORAL_TABLET | Freq: Two times a day (BID) | ORAL | Status: DC
Start: 2014-06-03 — End: 2014-06-03
  Administered 2014-06-03: 20 mg via ORAL
  Filled 2014-06-03: qty 1

## 2014-06-03 MED ORDER — ONDANSETRON 4 MG PO TBDP
4.0000 mg | ORAL_TABLET | Freq: Three times a day (TID) | ORAL | Status: DC | PRN
Start: 2014-06-03 — End: 2014-06-04

## 2014-06-03 MED ORDER — LORAZEPAM 2 MG/ML IJ SOLN
0.5000 mg | Freq: Three times a day (TID) | INTRAMUSCULAR | Status: DC | PRN
Start: 2014-06-03 — End: 2014-06-04

## 2014-06-03 MED ORDER — ACETAMINOPHEN 325 MG PO TABS
650.0000 mg | ORAL_TABLET | ORAL | Status: DC | PRN
Start: 2014-06-03 — End: 2014-06-03

## 2014-06-03 MED ORDER — ASPIRIN 325 MG PO TBEC
325.0000 mg | DELAYED_RELEASE_TABLET | Freq: Every day | ORAL | Status: DC
Start: 2014-06-03 — End: 2014-06-04
  Administered 2014-06-04: 325 mg via ORAL
  Filled 2014-06-03: qty 1

## 2014-06-03 MED ORDER — FAMOTIDINE 20 MG PO TABS
20.0000 mg | ORAL_TABLET | Freq: Two times a day (BID) | ORAL | Status: DC
Start: 2014-06-03 — End: 2014-06-04
  Administered 2014-06-03 – 2014-06-04 (×2): 20 mg via ORAL
  Filled 2014-06-03 (×2): qty 1

## 2014-06-03 MED ORDER — ONDANSETRON HCL 4 MG/2ML IJ SOLN
4.0000 mg | Freq: Three times a day (TID) | INTRAMUSCULAR | Status: DC | PRN
Start: 2014-06-03 — End: 2014-06-04

## 2014-06-03 MED ORDER — INSULIN ASPART 100 UNIT/ML SC SOLN
1.0000 [IU] | Freq: Every evening | SUBCUTANEOUS | Status: DC | PRN
Start: 2014-06-03 — End: 2014-06-04

## 2014-06-03 MED ORDER — SIMVASTATIN 10 MG PO TABS
10.0000 mg | ORAL_TABLET | Freq: Every evening | ORAL | Status: DC
Start: 2014-06-03 — End: 2014-06-04
  Administered 2014-06-03: 10 mg via ORAL
  Filled 2014-06-03: qty 1

## 2014-06-03 MED ORDER — HYDRALAZINE HCL 20 MG/ML IJ SOLN
10.0000 mg | Freq: Four times a day (QID) | INTRAMUSCULAR | Status: DC | PRN
Start: 2014-06-03 — End: 2014-06-04

## 2014-06-03 MED ORDER — ACETAMINOPHEN 325 MG PO TABS
650.0000 mg | ORAL_TABLET | ORAL | Status: DC | PRN
Start: 2014-06-03 — End: 2014-06-04

## 2014-06-03 MED ORDER — SODIUM CHLORIDE 0.9 % IV SOLN
INTRAVENOUS | Status: DC
Start: 2014-06-03 — End: 2014-06-03

## 2014-06-03 MED ORDER — GLIMEPIRIDE 2 MG PO TABS
2.0000 mg | ORAL_TABLET | Freq: Every morning | ORAL | Status: DC
Start: 2014-06-04 — End: 2014-06-04
  Administered 2014-06-04: 2 mg via ORAL
  Filled 2014-06-03: qty 1

## 2014-06-03 MED ORDER — LABETALOL HCL 5 MG/ML IV SOLN
20.0000 mg | Freq: Once | INTRAVENOUS | Status: DC
Start: 2014-06-03 — End: 2014-06-03

## 2014-06-03 MED ORDER — LOSARTAN POTASSIUM 25 MG PO TABS
50.0000 mg | ORAL_TABLET | Freq: Every day | ORAL | Status: DC
Start: 2014-06-04 — End: 2014-06-04
  Administered 2014-06-04: 50 mg via ORAL
  Filled 2014-06-03: qty 2

## 2014-06-03 MED ORDER — INSULIN ASPART 100 UNIT/ML SC SOLN
1.0000 [IU] | Freq: Three times a day (TID) | SUBCUTANEOUS | Status: DC | PRN
Start: 2014-06-03 — End: 2014-06-04
  Administered 2014-06-04 (×2): 1 [IU] via SUBCUTANEOUS
  Filled 2014-06-03 (×2): qty 1

## 2014-06-03 MED ORDER — LABETALOL HCL 5 MG/ML IV SOLN
20.00 mg | Freq: Once | INTRAVENOUS | Status: AC
Start: 2014-06-03 — End: 2014-06-03
  Administered 2014-06-03: 20 mg via INTRAVENOUS
  Filled 2014-06-03: qty 4

## 2014-06-03 NOTE — ED Notes (Signed)
Family at bedside.- patients daughter- who is translating for patient- patient understands some english and states wants her daughter to translate.

## 2014-06-03 NOTE — ED Notes (Signed)
[facility] took meds this morning- daughter states patient has dizziness periodically- denies chest pain or shortness of breath- c/o headache

## 2014-06-03 NOTE — Progress Notes (Signed)
Received patient from [facility] via PTS. Awake, oriented and ambulatory to the bed. Denies c/o or SOB. Does c/o posterior headache and neck pain. No distress noted. Patient is not primarily [place] speaking. Communication done by gesturing or facial expression. Waiting for daughter to arrive who is en route. Dr. Felix Ahmadi notified of patient's arrival. Placed patient on them monitor. NSR on the monitor

## 2014-06-03 NOTE — H&P (Signed)
[name] [id number]  Outpatient Primary MD for the patient is Pcp, [facility], MD    Assessment & Plan  Dizziness, lightheadedness, unsteady gait, possible transient ischemic attack   Hypertensive urgency  Anemia : Cause unclear  H/o Cerebrovascular accident  Diabetes.  hypertension.  Probable hyperlipidemia.  ?Plan  Admit to observation  Initiate stroke protocol  Continue aspirin, statin and blood pressure medication  IV hydralazine as needed  Monitor on telemetry  Check serial troponin  We will obtain MRI of the brain; patient had an MRI and MRA of the brain and neck last year.  Therefore, will not do a carotid ultrasound or MRA  PT, OT eval  Patient will need additional blood pressure medication at the time of discharge.  If workup comes out to be negative  Check a hemoglobin A1c  Resume home diabetes medications.  Start insulin sliding scale  Check stool heme and iron studies, thyroid-stimulating hormone, folic acid and [id number]  Repeat complete blood count in the morning    AM Labs Ordered, also please review Full Orders  Admission, patients condition and plan of care including tests being ordered have been discussed with the patient and daughter who indicate understanding and agree with the plan and Code Status.  Condition GUARDED  DVT Prohylaxis sequential compression devices  Code Status: Full code  Type of Admission: Observation  Estimated Length of Stay (including stay in the ER receiving treatment): Less than 2 midnights  Medical Necessity for stay: Dizziness, unsteadiness, possible transient ischemic attack.  Hypertensive urgency      With History of -  Past Medical History   Diagnosis Date   . Diabetes mellitus    . Hypertension    . Hypercholesteremia    . CVA (cerebral infarction) 03/2013   . Cataracts, bilateral       History reviewed. No pertinent past surgical history.  in for   Chief Complaint   Patient presents with   . Hypertension      HPI  Jacqueline Weber is a 63 y.o.  female with a history of stroke in 2014; he also has a history of diabetes, hypertension, hyperlipidemia.  The patient herself speaks no [place] and the patient's daughter interprets and provides additional history.   Patient was doing fine until she went to see her primary care physician in the office and was noted to have significantly elevated blood pressure.  While in the physician's office.  Patient started feeling dizzy, lightheaded and was sent to the emergency room   patient is currently lying in bed and reports that her left-sided tingling is slightly more prominent than her baseline from her previous stroke.  She has no other complaints    Review of Systems   In addition to the HPI above  No Fever-chills,  No Headache, No changes with Vision or hearing,  No problems swallowing food or Liquids,  No Chest pain, Cough or Shortness of Breath,  No Abdominal pain, No Nausea or Vommitting, Bowel movements are regular,  No Blood in stool or Urine,  No dysuria,  No new skin rashes or bruises,  No new joints pains-aches,   No recent weight gain or loss,  No polyuria, polydypsia or polyphagia,  No significant Mental Stressors.  A full 10 point Review of Systems was done, except as stated above, all other Review of Systems were negative.  ?  Social History  History   Substance Use Topics   . Smoking status: Never Smoker    .  Smokeless tobacco: Not on file   . Alcohol Use: No       Family History  Negative for stroke      Prior to Admission medications    Medication Sig Start Date End Date Taking? Authorizing Provider   aspirin 325 MG tablet Take 325 mg by mouth daily.   Yes [provider]   glimepiride (AMARYL) 4 MG tablet Take 1 tablet (4 mg total) by mouth every morning with breakfast.  Patient taking differently: Take 2 mg by mouth every morning with breakfast.    03/13/13  Yes Khuu, Docia Chuck, MD   losartan (COZAAR) 50 MG tablet Take 50 mg by mouth daily.   Yes [provider]   metFORMIN  (GLUCOPHAGE) 1000 MG tablet Take 1 tablet (1,000 mg total) by mouth 2 (two) times daily with meals. 03/06/13  Yes Jeannie Done IV, MD   Omega-3 Fatty Acids (OMEGA-3 FISH OIL) 500 MG Cap Take 2 capsules by mouth daily.   Yes [provider]   simvastatin (ZOCOR) 10 MG tablet Take 10 mg by mouth nightly.   Yes [provider]   aspirin 81 MG chewable tablet Chew 4 tablets (324 mg total) by mouth daily. 03/13/13 06/03/14  Eugenia Pancoast, MD   lisinopril (PRINIVIL,ZESTRIL) 5 MG tablet Take 1 tablet (5 mg total) by mouth daily. 03/13/13 06/03/14  Eugenia Pancoast, MD   pravastatin (PRAVACHOL) 20 MG tablet Take 1 tablet (20 mg total) by mouth nightly. 03/13/13 06/03/14  Eugenia Pancoast, MD     Allergies   Allergen Reactions   . Penicillins      Physical Exam  Vitals  Blood pressure 182/86, pulse 81, temperature 97.5 F (36.4 C), temperature source Temporal Artery, resp. rate 18, height 1.524 m (5'), weight 72.576 kg (160 lb), SpO2 97 %.  ?  1. General :lying in bed in NAD, *  2. Normal affect and insight, Not Suicidal or Homicidal, Awake Alert, Oriented *3.  3. No F.N deficits, ALL C.Nerves Intact, Strength 5/5 all 4 extremities, Sensation intact all 4 extremities, Plantars down going.  4. Ears and Eyes appear Normal, Conjunctivae clear, PERRLA. Moist Oral Mucosa.  5. Supple Neck, No JVD, No cervical lymphadenopathy appriciated, No Carotid Bruits.  6. Symmetrical Chest wall movement, Good air movement bilaterally, CTAB.  7. RRR, No Gallops, Rubs or Murmurs, No Parasternal Heave.  8. Positive Bowel Sounds, Abdomen Soft, Non tender, No organomegaly appriciated,   No rebound -guarding or rigidity.  9. No Cyanosis, Normal Skin Turgor, No Skin Rash or Bruise.  10. Good muscle tone, joints appear normal , no effusions, Normal ROM.  11. No Palpable Lymph Nodes in Neck or Axillae    Data Review  Labs:reviewed     Results for orders placed or performed during the hospital encounter of 06/03/14   Comprehensive metabolic  panel   Result Value Ref Range    Glucose 111 (H) 70 - 100 mg/dL    BUN 16.0 7.0 - 19.0 mg/dL    Creatinine 0.9 0.6 - 1.0 mg/dL    Sodium 138 136 - 145 mEq/L    Potassium 4.2 3.5 - 5.1 mEq/L    Chloride 105 100 - 111 mEq/L    CO2 23 22 - 29 mEq/L    CALCIUM 9.8 8.5 - 10.5 mg/dL    Protein, Total 7.1 6.0 - 8.3 g/dL    Albumin 3.8 3.5 - 5.0 g/dL    AST (SGOT) 20 5 - 34  U/L    ALT 17 0 - 55 U/L    Alkaline Phosphatase 62 37 - 106 U/L    Bilirubin, Total 0.3 0.2 - 1.2 mg/dL    Globulin 3.3 2.0 - 3.6 g/dL    Albumin/Globulin Ratio 1.2 0.9 - 2.2    Anion Gap 10.0 5.0 - 15.0   CBC with differential   Result Value Ref Range    WBC 7.95 3.50 - 10.80 x10 3/uL    RBC 4.87 4.20 - 5.40 x10 6/uL    Hgb 10.2 (L) 12.0 - 16.0 g/dL    Hematocrit 32.3 (L) 37.0 - 47.0 %    MCV 66.3 (L) 80.0 - 100.0 fL    MCH 20.9 (L) 28.0 - 32.0 pg    MCHC 31.6 (L) 32.0 - 36.0 g/dL    RDW 15 12 - 15 %    Platelets 229 140 - 400 x10 3/uL    MPV 11.1 9.4 - 12.3 fL    Neutrophils 50 None %    Lymphocytes Automated 40 None %    Monocytes 6 None %    Eosinophils Automated 3 None %    Basophils Automated 0 None %    Immature Granulocyte Unmeasured None %    Nucleated RBC Unmeasured 0 - 1 /100 WBC    Neutrophils Absolute 3.99 1.80 - 8.10 x10 3/uL    Abs Lymph Automated 3.19 0.50 - 4.40 x10 3/uL    Abs Mono Automated 0.51 0.00 - 1.20 x10 3/uL    Abs Eos Automated 0.22 0.00 - 0.70 x10 3/uL    Absolute Baso Automated 0.04 0.00 - 0.20 x10 3/uL    Absolute Immature Granulocyte Unmeasured 0 x10 3/uL   Troponin I   Result Value Ref Range    Troponin I <0.01 0.00 - 0.09 ng/mL   GFR   Result Value Ref Range    EGFR >60.0    Cell MorpHology   Result Value Ref Range    Cell Morphology: Abnormal (A)     Anisocytosis =1+ (A)     Poikilocytosis =1+ (A)     Microcytic =1+ (A)     Hypochromia =1+ (A)     Ovalocytes =1+ (A)     Burr Cells =1+ (A)     Platelet Estimate Normal    Glucose Whole Blood - POCT   Result Value Ref Range    POCT - Glucose Whole blood 116 (H) 70 - 100  mg/dL   ECG 12 Lead   Result Value Ref Range    Ventricular Rate 87 BPM    Atrial Rate 87 BPM    P-R Interval 144 ms    QRS Duration 76 ms    Q-T Interval 328 ms    QTC Calculation (Bezet) 394 ms    P Axis 1 degrees    R Axis 13 degrees    T Axis 260 degrees         Rads:reviewed     Radiology Results (24 Hour)     Procedure Component Value Units Date/Time    CT Head WO Contrast [id number] Collected:  06/03/14 1223    Order Status:  Completed Updated:  06/03/14 1232    Narrative:      History: Hypertension, headache.    FINDINGS: Brain CT without intravenous contrast. Correlation with a  brain CT dated March 11, 2013 and a prior MRI dated March 09, 2013.    Minor chronic small vessel ischemic changes in the supratentorial white  matter and a more focal infarction the right anterior pons seen on the  prior MRI are below CT resolution. There is no mass, acute intracranial  hemorrhage. The ventricular system and cisterns are normally configured.  There is minor mucosal thickening in the ethmoid air cells. There are  atherosclerotic calcifications in the distal internal carotid arteries.  The calvarium is intact.      Impression:        1. Chronic ischemic changes seen on the prior MRI are below CT  resolution.  2. Intracranial atherosclerosis.    Dianne Dun, MD   06/03/2014 12:28 PM      Chest 2 Views [id number] Collected:  06/03/14 1149    Order Status:  Completed Updated:  06/03/14 1155    Narrative:      Clinical history: Chest pain. Hypertension.    COMPARISON: 03/09/2013.    Chest, PA and lateral: There is spinal degenerative change. The cardiac,  mediastinal, and hilar silhouettes are within normal limits. The lungs  are clear.      Impression:       No acute process.    Nicholes Rough, MD   06/03/2014 11:51 AM          chest X-ray    Cardiac Enzymes  No results for input(s): CK, CKMB, MYOGLOBIN in the last 168 hours.    Invalid input(s):  TROPONINI  ------------------------------------------------------------------------------------------------------------------    My personal review of EKG:   NORMAL SINUS RHYTHM  SEPTAL INFARCT , AGE UNDETERMINED  ABNORMAL ECG  WHEN COMPARED WITH ECG OF 09-Mar-2013 12:07,  SEPTAL INFARCT IS NOW PRESENT  NON-SPECIFIC CHANGE IN ST SEGMENT IN INFERIOR LEADS  ST NOW DEPRESSED IN LATERAL LEADS  NONSPECIFIC T WAVE ABNORMALITY, WORSE IN INFERIOR LEADS     Personally reviewed Old Chart from 03/10/2013  1. Technically difficult but adequate study for interpretation  2. Hyperdynamic left ventricular function with an EF of greater than 75%  3. Increased left ventricular outflow tract velocity causing a gradient  of 48 mmHg number   4. Adequately functioning cardiac valves  Gavin Pound, MD   03/10/2013      Dirk Dress M.D on 06/03/2014 at 4:48 PM

## 2014-06-03 NOTE — ED Provider Notes (Signed)
Physician/Midlevel provider first contact with patient: 06/03/14 1125         History     Chief Complaint   Patient presents with   . Hypertension     HPI Comments:   Chief Complaint: Very high blood pressure [facility] the [facility] this morning and sent to the ED  Location: Mild dizziness and headache  Timing: Several days  Severity: Mild  Quality: Generalized achiness  Modifying Factors: Has been taking her hypertension.  Medications no other aggravating factors  Associated signs and symptoms: No focal weakness.  No nausea, vomiting  Context: Patient with a known history of hypertension, CVA brought to the emergency department from the free clinic where she was having a routine checkup and was noted to have a blood pressure systolic [id number].  Asian not complaining of chest pain, shortness of breath, abdominal pain, back pain or urinary symptoms      Patient is a 63 y.o. female presenting with hypertension.   Hypertension  Pertinent negatives include no abdominal pain, chest pain, chills, congestion, coughing, fever, headaches, nausea, sore throat or vomiting.            Past Medical History   Diagnosis Date   . Diabetes mellitus    . Hypertension    . Hypercholesteremia        History reviewed. No pertinent past surgical history.    No family history on file.    Social  History   Substance Use Topics   . Smoking status: Never Smoker    . Smokeless tobacco: Not on file   . Alcohol Use: No       .     Allergies   Allergen Reactions   . Penicillins        Current/Home Medications    ASPIRIN 81 MG CHEWABLE TABLET    Chew 4 tablets (324 mg total) by mouth daily.    GLIMEPIRIDE (AMARYL) 4 MG TABLET    Take 1 tablet (4 mg total) by mouth every morning with breakfast.    LOSARTAN (COZAAR) 50 MG TABLET    Take 50 mg by mouth daily.    METFORMIN (GLUCOPHAGE) 1000 MG TABLET    Take 1 tablet (1,000 mg total) by mouth 2 (two) times daily with meals.    SIMVASTATIN (ZOCOR) 10 MG TABLET    Take 10 mg by mouth nightly.        Review of  Systems   Constitutional: Negative for fever and chills.   HENT: Negative for congestion and sore throat.    Eyes: Negative for pain and redness.   Respiratory: Negative for cough and shortness of breath.    Cardiovascular: Negative for chest pain and palpitations.   Gastrointestinal: Negative for nausea, vomiting and abdominal pain.   Genitourinary: Negative for dysuria and frequency.   Musculoskeletal: Negative for back pain.   Neurological: Negative for dizziness and headaches.   Psychiatric/Behavioral: Negative.        Physical Exam    BP: (!) 208/101 mmHg, Heart Rate: 81, Temp: 98.2 F (36.8 C), Resp Rate: 16, SpO2: 99 %, Weight: 70.761 kg    Physical Exam   Constitutional: She is oriented to person, place, and time. She appears well-developed and well-nourished.   HENT:   Head: Normocephalic and atraumatic.   Eyes: EOM are normal. Pupils are equal, round, and reactive to light.   Neck: Normal range of motion. Neck supple.   Cardiovascular: Normal rate, regular rhythm and normal heart sounds.  Pulmonary/Chest: Effort normal and breath sounds normal.   Abdominal: Soft. Bowel sounds are normal.   Musculoskeletal: Normal range of motion.   Neurological: She is alert and oriented to person, place, and time.   Skin: Skin is warm and dry.   Psychiatric: She has a normal mood and affect.       MDM and ED Course     ED Medication Orders     Start     Status Ordering Provider    06/03/14 1323     Once,   Status:  Discontinued     Route: Intravenous  Ordered Dose: 20 mg     Discontinued Lora Paula [facility]    06/03/14 1244  hydrALAZINE (APRESOLINE) injection 10 mg   Once     Route: Intravenous  Ordered Dose: 10 mg     Last MAR action:  Given Lora Paula GITANJALI    06/03/14 1159  labetalol (NORMODYNE,TRANDATE) injection 20 mg   Once     Route: Intravenous  Ordered Dose: 20 mg     Last MAR action:  Given Seidy Labreck GITANJALI              MDM  Number of Diagnoses or Management Options  History of CVA  (cerebrovascular accident):   Hyperlipidemia:   Hypertensive urgency:   Uncontrolled hypertension:   Diagnosis management comments: I, Lora Paula, MD, have been the primary provider for this patient during this Emergency Dept visit.    Oxygen saturation by pulse oximetry is 95%-100%, Normal.  Interventions: None Needed    EKG interpreted by ED physician    Rate: Normal  Rhythm: sinus rhythm  Axis: Normal  ST Segments: Normal ST segments  T waves: Normal T Waves  Conduction: No blocks  Impression: Normal EKG  No change from March 09, 2013 except for ST depression in V5 and V6    Labs Reviewed  COMPREHENSIVE METABOLIC PANEL - Abnormal; Notable for the following:      Glucose                       111 (*)             All other components within normal limits  CBC AND DIFFERENTIAL - Abnormal; Notable for the following:      Hgb                           10.2 (*)               Hematocrit                    32.3 (*)               MCV                           66.3 (*)               MCH                           20.9 (*)               MCHC                          31.6 (*)  All other components within normal limits  CELL MORPHOLOGY - Abnormal; Notable for the following:      Cell Morphology:              Abnormal (*)               Anisocytosis                  =1+ (*)                Poikilocytosis                =1+ (*)                Microcytic                    =1+ (*)                Hypochromia                   =1+ (*)                Ovalocytes                    =1+ (*)                Burr Cells                    =1+ (*)             All other components within normal limits  TROPONIN I  GFR    CT Head WO Contrast   Final Result    History: Hypertension, headache.        FINDINGS: Brain CT without intravenous contrast. Correlation with a    brain CT dated March 11, 2013 and a prior MRI dated March 09, 2013.        Minor chronic small vessel ischemic changes in the supratentorial white    matter and a more  focal infarction the right anterior pons seen on the    prior MRI are below CT resolution. There is no mass, acute intracranial    hemorrhage. The ventricular system and cisterns are normally configured.    There is minor mucosal thickening in the ethmoid air cells. There are    atherosclerotic calcifications in the distal internal carotid arteries.    The calvarium is intact.        IMPRESSION:         1. Chronic ischemic changes seen on the prior MRI are below CT    resolution.    2. Intracranial atherosclerosis.        Dianne Dun, MD     06/03/2014 12:28 PM         Chest 2 Views   Final Result    Clinical history: Chest pain. Hypertension.        COMPARISON: 03/09/2013.        Chest, PA and lateral: There is spinal degenerative change. The cardiac,    mediastinal, and hilar silhouettes are within normal limits. The lungs    are clear.        IMPRESSION:      No acute process.        Nicholes Rough, MD     06/03/2014 11:51 AM       Patient Vitals in the past 24 hrs:  06/03/14 1243, BP:216/98 mmHg, Pulse:75, Resp:16  06/03/14 1208,  Pulse:76, Resp:18, SpO2:100 %  06/03/14 1148, BP:226/107 mmHg  06/03/14 1130, Pulse:74, SpO2:99 %  06/03/14 1112, BP:208/101 mmHg  06/03/14 1106, Temp:98.2 F (36.8 C), Pulse:81, Resp:16, SpO2:99 %, Weight:70.761 kg    Patient not complaining of anything except for occasional dizziness, per her daughter who is a Optometrist.  Probable hypertensive urgency.      Discussed with Dr. Felix Ahmadi, hospitalist for admission for treatment of extremely high blood pressures, although no evidence of target organ damage..  .             Amount and/or Complexity of Data Reviewed  Clinical lab tests: ordered and reviewed  Tests in the radiology section of CPT: ordered and reviewed  Tests in the medicine section of CPT: ordered and reviewed    Risk of Complications, Morbidity, and/or Mortality  Presenting problems: high  Diagnostic procedures: high  Management options: high    Critical  Care  Total time providing critical care: 30-74 minutes    Patient Progress  Patient progress: (Guarded  )         Procedures    Clinical Impression & Disposition     Clinical Impression  Final diagnoses:   Hypertensive urgency   Uncontrolled hypertension   Hyperlipidemia   History of CVA (cerebrovascular accident)        ED Disposition     Observation Admitting Physician: Dirk Dress [id number]  Diagnosis: Hypertensive urgency [id number]  Estimated Length of Stay: < 2 midnights  Tentative Discharge Plan?: Home or Self Care [1]  Patient Class: Observation [104]             New Prescriptions    No medications on file                 Ross Marcus, MD  06/03/14 1359

## 2014-06-04 DIAGNOSIS — I10 Essential (primary) hypertension: Secondary | ICD-10-CM

## 2014-06-04 DIAGNOSIS — E785 Hyperlipidemia, unspecified: Secondary | ICD-10-CM

## 2014-06-04 LAB — PT/INR
PT INR: 1
PT: 13.2 s (ref 12.6–15.0)

## 2014-06-04 LAB — COMPREHENSIVE METABOLIC PANEL
ALT: 15 U/L (ref 0–55)
AST (SGOT): 16 U/L (ref 5–34)
Albumin/Globulin Ratio: 1.3 (ref 0.9–2.2)
Albumin: 3.5 g/dL (ref 3.5–5.0)
Alkaline Phosphatase: 47 U/L (ref 37–106)
Anion Gap: 9 (ref 5.0–15.0)
BUN: 14.3 mg/dL (ref 7.0–19.0)
Bilirubin, Total: 0.3 mg/dL (ref 0.2–1.2)
CO2: 23 mEq/L (ref 22–29)
Calcium: 9.7 mg/dL (ref 8.5–10.5)
Chloride: 107 mEq/L (ref 100–111)
Creatinine: 0.8 mg/dL (ref 0.6–1.0)
Globulin: 2.8 g/dL (ref 2.0–3.6)
Glucose: 108 mg/dL — ABNORMAL HIGH (ref 70–100)
Potassium: 4.1 mEq/L (ref 3.5–5.1)
Protein, Total: 6.3 g/dL (ref 6.0–8.3)
Sodium: 139 mEq/L (ref 136–145)

## 2014-06-04 LAB — CBC
Hematocrit: 30.2 % — ABNORMAL LOW (ref 37.0–47.0)
Hgb: 9.4 g/dL — ABNORMAL LOW (ref 12.0–16.0)
MCH: 20.3 pg — ABNORMAL LOW (ref 28.0–32.0)
MCHC: 31.1 g/dL — ABNORMAL LOW (ref 32.0–36.0)
MCV: 65.4 fL — ABNORMAL LOW (ref 80.0–100.0)
MPV: 11.4 fL (ref 9.4–12.3)
Platelets: 234 10*3/uL (ref 140–400)
RBC: 4.62 10*6/uL (ref 4.20–5.40)
RDW: 15 % (ref 12–15)
WBC: 7.3 10*3/uL (ref 3.50–10.80)

## 2014-06-04 LAB — ECG 12-LEAD
Atrial Rate: 87 {beats}/min
P Axis: 1 degrees
P-R Interval: 144 ms
Q-T Interval: 328 ms
QRS Duration: 76 ms
QTC Calculation (Bezet): 394 ms
R Axis: 13 degrees
T Axis: 260 degrees
Ventricular Rate: 87 {beats}/min

## 2014-06-04 LAB — TROPONIN I: Troponin I: <0.01 ng/mL (ref 0.00–0.09)

## 2014-06-04 LAB — LIPID PANEL
Cholesterol / HDL Ratio: 2.9
Cholesterol: 148 mg/dL (ref 0–199)
HDL: 51 mg/dL (ref >40)
LDL Calculated: 76 mg/dL (ref 0–99)
Triglycerides: 103 mg/dL (ref 34–149)
VLDL Calculated: 21 mg/dL (ref 10–40)

## 2014-06-04 LAB — GLUCOSE WHOLE BLOOD - POCT
Whole Blood Glucose POCT: 160 mg/dL — ABNORMAL HIGH (ref 70–100)
Whole Blood Glucose POCT: 163 mg/dL — ABNORMAL HIGH (ref 70–100)

## 2014-06-04 LAB — HEMOGLOBIN A1C: Hemoglobin A1C: 7.9 % — ABNORMAL HIGH (ref 0.0–6.0)

## 2014-06-04 LAB — GFR: EGFR: >60

## 2014-06-04 LAB — HEMOLYSIS INDEX: Hemolysis Index: 4 (ref 0–18)

## 2014-06-04 MED ORDER — ASPIRIN 325 MG PO TBEC: 325.0000 mg | DELAYED_RELEASE_TABLET | Freq: Every day | ORAL | Status: AC

## 2014-06-04 MED ORDER — LOSARTAN POTASSIUM 50 MG PO TABS
50.0000 mg | ORAL_TABLET | Freq: Two times a day (BID) | ORAL | Status: DC
Start: 2014-06-04 — End: 2015-10-01

## 2014-06-04 NOTE — Progress Notes (Signed)
Informed Dr. Lydia Guiles regarding elevated BP and that the parameters for prn hydralazine is for sbp>200. MD is aware and that if patient is asymptomatic, no need to give. On [place] med she will increase losartan. Wctm.

## 2014-06-04 NOTE — Plan of Care (Signed)
Problem: Day 2- Stroke  Goal: Neurological status is stable or improving  Outcome: Progressing  Stable neurological status/ vital signs. Mobility/activity is maintained at optimum level.      INTERVENTION: Monitor/assess neurological status q 4hrs and prn.  INTERVENTION: Maitain safe environment.  INTERVENTION: Assess patient risk for falls and implement fall prevention plan.  INTERVENTION: Position for adequate oxygenation.  INTERVENTION: PT/OT eval and treat, proper body alignment, increase activity as tolerated.        Comments:   Patient in nad, daughter at the bedside, denies any discomfort/complaints. Assisted with breakfast and am care. Safety measures were ensured and maintained, will continue to monitor.

## 2014-06-04 NOTE — PT Eval Note (Signed)
[facility]  [place], [place]    Department of Rehabilitation  [phone]    Physical Therapy Evaluation    Patient: Jacqueline Weber    MRN#: [id number]     M201/M201-A    Time of treatment: Time Calculation  PT Received On: 06/04/14  Start Time: 0912  Stop Time: 0936  Time Calculation (min): 24 min    PT Visit Number: 1    Consult received for Jacqueline Weber for PT Evaluation and Treatment.  Patient's medical condition is appropriate for Physical therapy intervention at this time.      Assessment:   Jacqueline Weber is a 63 y.o. female admitted 06/03/2014 presenting with hypertension.     Impairments: Assessment: Appears to be at baseline for mobility.     Therapy Diagnosis: None.    Rehabilitation Potential: Prognosis: Good      Plan:    Treatment/Interventions: No skilled interventions needed at this time PT Frequency: one time visit    Risks/Benefits/POC Discussed with Pt/Family: With patient/family     Progress: Discontinue PT    Goals: n/a         Discharge Recommendations:   Based on today's session patient's discharge recommendation is the following: Home with supervision          Precautions and Contraindications: Fall risk        Medical Diagnosis: Hyperlipidemia [272.4 (ICD-9-CM)]  History of CVA (cerebrovascular accident) [V12.54 (ICD-9-CM)]  Hypertensive urgency [401.9 (ICD-9-CM)]  Uncontrolled hypertension [401.9 (ICD-9-CM)]    History of Present Illness: Jacqueline Weber is a 63 y.o. female admitted on 06/03/2014 with "significantly elevated blood pressure.  While in the physician's office.  Patient started feeling dizzy, lightheaded and was sent to the emergency room" per H&P.    Patient Active Problem List   Diagnosis   . R/o CVA   . Type 2 diabetes mellitus without complication   . Hypertensive emergency   . HTN (hypertension) urgency   . CVA (cerebral vascular accident)   . Hyperlipidemia   . Hypertensive urgency   . Dizziness   . Unsteady gait   . History  of CVA (cerebrovascular accident)   . Uncontrolled hypertension        Past Medical/Surgical History:  Past Medical History   Diagnosis Date   . Diabetes mellitus    . Hypertension    . Hypercholesteremia    . CVA (cerebral infarction) 03/2013   . Cataracts, bilateral       History reviewed. No pertinent past surgical history.      X-Rays/Tests/Labs:  MRI Brain  IMPRESSION:       1. No acute intracranial abnormality is detected. There are mild chronic  ischemic changes.      Social History:  Prior Level of Function  Prior level of function: Independent with ADLs, Ambulates independently  Baseline Activity Level: Community ambulation, Household ambulation  Driving: does not drive  Cooking: Yes  DME Currently at Home: Single point cane  Home Living Arrangements  Living Arrangements: Children  Type of Home: House  Home Layout: Multi-level, Bed/bath upstairs  Bathroom Shower/Tub: Tub/shower unit  DME Currently at Home: Single point cane      Subjective:    Patient is agreeable to participation in the therapy session. Nursing clears patient for therapy. No c/o pain currently. Reports she is feeling better.    Patient Goal  Patient Goal: To go home       Objective:   Observation of Patient/Vital  Signs:  Patient is in bed with telemetry in place.    Cognition  Arousal/Alertness: Appropriate responses to stimuli  Attention Span: Appears intact  Following Commands: Follows all commands and directions without difficulty  Safety Awareness: minimal verbal instruction  Insights: Fully aware of deficits  Neuro Status  Behavior: attentive;calm;cooperative  Motor Planning: intact  Coordination: intact    Hearing: WNL  Vision: wears glasses for reading  Sensation: intact light touch BLE except pt reports tingling L great toe since CVA.    Gross ROM  Right Lower Extremity ROM: within functional limits  Left Lower Extremity ROM: within functional limits  Gross Strength  Right Lower Extremity Strength: 4+/5  Left Lower Extremity  Strength: 4+/5 (except:)  L Hip Flexion: 4-/5  L Knee Flexion: 4-/5       Functional Mobility  Supine to Sit: Modified Independent (HOB elevated)  Sit to Stand: Supervision  Stand to Sit: Supervision     Locomotion  Ambulation: Supervision  Ambulation Distance (Feet): 200 Feet  Pattern: decreased cadence     Balance  Balance: needs focused assessment  Sitting - Static: Good  Sitting - Dynamic: Good  Standing - Static: Good  Standing - Dynamic: Fair    Participation and Endurance  Participation Effort: good  Endurance: Tolerates 10 - 20 min exercise with multiple rests. No c/o dizziness or SOB during mobility    Treatment Activities: Educated in signs/sx CVA and instructed to return to ED if symptoms occur. Educated in importance of BP management as elevated BP can mimic sx of CVA as well as be a factor in a CVA. Encouraged to monitor BP daily upon return home, especially with medication changes. Pt ambulated to/from bathroom in room, supervision for mobility, tranfers and ADLs for safety. Encouraged to ambulate as tolerated while in hospital to decrease effects of immobility. Discussed supervision upon return home for safety. Pt verbalized understanding for all education provided.    Educated the patient to role of physical therapy, plan of care, goals of therapy and HEP, safety with mobility and ADLs, home safety.    At end of session pt seated upright at EOB, call bell and items in reach. RN aware.      Ladon Applebaum, PT, DPT  Pager #: [phone]

## 2014-06-04 NOTE — Progress Notes (Signed)
MODIFIED RANKIN SCALE    0 No symptoms at all.    1 No significant disability despite symptoms; able to carry out all usual duties and activities.    2 Slight disability; unable to carry out all previous activities, but able to look after own affairs without assistance.    3 Moderate disability; requiring some help, but able to walk without assistance.    4 Moderately severe disability; unable to walk without assistance and unable to attend to own bodily needs without assistance.    5 Severe disability; bedridden, incontinent and requiring constant nursing care and attention.    6 Dead.      TOTAL (0-6): ___0____

## 2014-06-04 NOTE — Discharge Instructions (Signed)
Ask3Teach3 Program    Education about New Medications and their Side effects    Dear Jacqueline Weber,    Its been a pleasure taking care of you during your hospitalization here at [facility]. We have initiated a new program to educate our patients and/or their family members or designated personnel about the new medications started by your physicians and their indications along with the possible side effects. Multiple studies have shown that patients started on new medications are often unaware of the names of the medication along with the indications and their side effects which leads to decreased compliance with the medications.    During our conversation today on 06/04/2014  3:47 PM I have explained to you the name of the new medication and the indication along with some possible common side effects. Listed below are some of the new medications started during this hospitalization.     Please call the Nurse if you have any side effects while in hospital.     Please call [phone] if you have any life threatening symptoms after you are discharged from the hospital.    Please inform your Primary care physician for common side effects which are not life threatening after discharge.    Medication Name: Losartan (Cozaar)   This Medication is used for:   Blood Pressure   Congestive Heart Failure  Common Side Effects are:   Dizziness   Low Blood Pressure    Increased Potassium  A note from your nurse:  Call your nurse immediately if you notice itching, hives, swelling or trouble breathing       Thank you for your time.    [address] [place], RN  06/04/2014  3:47 PM  [facility]  [address]  [place], [place]  [address]

## 2014-06-04 NOTE — Discharge Summary (Signed)
[facility] Hospitalist Discharge Note    Note Date: 06/04/2014  9:58 AM  Patient Name:Jacqueline Weber  [id number]  PCP: Juanda Bond, MD  Admit Date:06/03/2014  Attending Physician:Chante Mayson, Katina Degree, MD  Hospital Course:   Please see H&P for complete details of HPI and ROS. The patient was admitted to [facility] and has been diagnosed with the following conditions and has been taken care as mentioned below.    Patient Active Problem List    Diagnosis Date Noted   . Hypertensive urgency 06/03/2014   . Dizziness 06/03/2014   . Unsteady gait 06/03/2014   . History of CVA (cerebrovascular accident)    . Hyperlipidemia 03/11/2013   . CVA (cerebral vascular accident) 03/10/2013   . R/o CVA 03/09/2013   . Type 2 diabetes mellitus without complication [id number]   . Hypertensive emergency 03/09/2013   . HTN (hypertension) urgency 03/09/2013       63 year old female came in with a chief complaint of dizziness, lightheadedness, unsteady gait.  For more details, refer to the history and physical done by Dr. Felix Ahmadi.  1.  Transient ischemic attack: Patient was admitted, had frequent neuro checks, telemetry monitoring, started on aspirin, continued on statin, patient had an MRI and MRA of the brain  No acute infarct but with the small vessel ischemic changes.  Patient is being discharged with below medications.  She was asked to follow-up with her PCP as an outpatient.  2.  Hypertension urgency: Blood pressure was fairly controlled at the time of discharge.  She was advised to continue her own home medications and was advised to see her PCP in the next 2 days.  3.  Pedis mellitus type II: Blood sugars have been okay.  She was asked to continue her home medications.  4.  Dizziness and unsteady gait: Much better at the time of discharge with the good ambulation.  No unsteady gait.  She was seen by PT, OT and cleared and is stable to be discharged home.        Type of Admission: Observation  Medical Necessity for  stay: As above    Date of Admission:   06/03/2014  Date of Discharge:   06/04/2014   Chief Complaint:      Chief Complaint   Patient presents with   . Hypertension         Discharge Diagnosis:   Hospital Problems:  Principal Problem:    Dizziness  Active Problems:    R/o CVA    Type 2 diabetes mellitus without complication    CVA (cerebral vascular accident)    Hypertensive urgency    Unsteady gait    History of CVA (cerebrovascular accident)    Lists the present on admission hospital problems  Present on Admission:   . Hypertensive urgency  . Dizziness  . Unsteady gait  . R/o CVA  . Type 2 diabetes mellitus without complication  . CVA (cerebral vascular accident)  Consult Input/Plan   Plan  None  Procedures performed:   No orders of the defined types were placed in this encounter.     Physical Exam:    height is 1.524 m (5') and weight is 71.8 kg (158 lb 4.6 oz). Her temporal artery temperature is 97.8 F (36.6 C). Her blood pressure is 188/84 and her pulse is 76. Her respiration is 18 and oxygen saturation is 98%.   Body mass index is 30.91 kg/(m^2).  Filed Vitals:    06/03/14 1800 06/03/14 2200 06/04/14 0200  06/04/14 0600   BP: 159/77 173/84 180/84 188/84   Pulse: 82 77 82 76   Temp: 97.4 F (36.3 C) 97.5 F (36.4 C) 97.6 F (36.4 C) 97.8 F (36.6 C)   TempSrc: Temporal Artery Temporal Artery Temporal Artery Temporal Artery   Resp: 18 18 18 18    Height:       Weight:    71.8 kg (158 lb 4.6 oz)   SpO2: 97% 96% 97% 98%     Intake and Output Summary (Last 24 hours) at Date Time    Intake/Output Summary (Last 24 hours) at 06/04/14 0958  Last data filed at 06/03/14 1930   Gross per 24 hour   Intake      0 ml   Output    200 ml   Net   -200 ml     Patient seen and examined.  Vital signs reviewed  Labs:   I have reviewed the labs  Results     Procedure Component Value Units Date/Time    Glucose Whole Blood - POCT [id number]  (Abnormal) Collected:  06/04/14 0708     POCT - Glucose Whole blood 160 (H) mg/dL Updated:   06/04/14 0715    Lipid panel [id number] Collected:  06/04/14 0121    Specimen Information:  Blood Updated:  06/04/14 0514     Cholesterol 148 mg/dL      Triglycerides 103 mg/dL      HDL 51 mg/dL      LDL Calculated 76 mg/dL      VLDL Cholesterol Cal 21 mg/dL      CHOL/HDL Ratio 2.9     Hemolysis index [id number] Collected:  06/04/14 0121     Hemolysis Index 4 Updated:  06/04/14 0514    CBC [id number]  (Abnormal) Collected:  06/04/14 0121    Specimen Information:  Blood / Blood Updated:  06/04/14 0202     WBC 7.30 x10 3/uL      RBC 4.62 x10 6/uL      Hgb 9.4 (L) g/dL      Hematocrit 30.2 (L) %      MCV 65.4 (L) fL      MCH 20.3 (L) pg      MCHC 31.1 (L) g/dL      RDW 15 %      Platelets 234 x10 3/uL      MPV 11.4 fL      Nucleated RBC Unmeasured /100 WBC     Protime-INR [id number] Collected:  06/04/14 0121    Specimen Information:  Blood Updated:  06/04/14 0201     PT 13.2 sec      PT INR 1.0      PT Anticoag. Given Within 48 hrs. None     Troponin I [id number] Collected:  06/04/14 0121    Specimen Information:  Blood Updated:  06/04/14 0157     Troponin I <0.01 ng/mL     Narrative:      Notify MD for Troponin value of greater than 1.0    Comprehensive metabolic panel [id number]  (Abnormal) Collected:  06/04/14 0121    Specimen Information:  Blood Updated:  06/04/14 0152     Glucose 108 (H) mg/dL      BUN 14.3 mg/dL      Creatinine 0.8 mg/dL      Sodium 139 mEq/L      Potassium 4.1 mEq/L      Chloride 107 mEq/L      CO2 23 mEq/L  CALCIUM 9.7 mg/dL      Protein, Total 6.3 g/dL      Albumin 3.5 g/dL      AST (SGOT) 16 U/L      ALT 15 U/L      Alkaline Phosphatase 47 U/L      Bilirubin, Total 0.3 mg/dL      Globulin 2.8 g/dL      Albumin/Globulin Ratio 1.3      Anion Gap 9.0     GFR [id number] Collected:  06/04/14 0121     EGFR >60.0 Updated:  06/04/14 0152    Vitamin B12 [id number] Collected:  06/03/14 1912    Specimen Information:  Blood Updated:  06/03/14 2245     Vitamin B-12 579 pg/mL     Narrative:       Notify MD for Troponin value of greater than 1.0    Folate [id number] Collected:  06/03/14 1912    Specimen Information:  Blood Updated:  06/03/14 2244     Folate 12.6 ng/mL     Narrative:      Notify MD for Troponin value of greater than 1.0    Glucose Whole Blood - POCT [id number]  (Abnormal) Collected:  06/03/14 2213     POCT - Glucose Whole blood 110 (H) mg/dL Updated:  06/03/14 2230    Ferritin [id number] Collected:  06/03/14 1912    Specimen Information:  Blood Updated:  06/03/14 2230     Ferritin 69.03 ng/mL     Narrative:      Notify MD for Troponin value of greater than 1.0    Lipid panel (Fasting) [id number] Collected:  06/03/14 1912    Specimen Information:  Blood Updated:  06/03/14 2209     Cholesterol 147 mg/dL      Triglycerides 100 mg/dL      HDL 53 mg/dL      LDL Calculated 74 mg/dL      VLDL Cholesterol Cal 20 mg/dL      CHOL/HDL Ratio 2.8     Narrative:      Notify MD for Troponin value of greater than 1.0    Hemolysis index [id number] Collected:  06/03/14 1912     Hemolysis Index 3 Updated:  06/03/14 2209    Narrative:      Notify MD for Troponin value of greater than 1.0    IRON PROFILE [id number] Collected:  06/03/14 1912     Iron 50 ug/dL Updated:  06/03/14 2209     UIBC 267 ug/dL      TIBC 317 ug/dL      Iron Saturation 16 %     Narrative:      Notify MD for Troponin value of greater than 1.0    Urinalysis with microscopic [id number]  (Abnormal) Collected:  06/03/14 2032    Specimen Information:  Urine Updated:  06/03/14 2159     Urine Type Clean Catch      Color, UA YELLOW      Clarity, UA CLEAR      Specific Gravity UA 1.035      Urine pH 7.0      Leukocyte Esterase, UA TRACE (A)      Nitrite, UA NEGATIVE      Protein, UR NEGATIVE      Glucose, UA NEGATIVE      Ketones UA NEGATIVE      Urobilinogen, UA 0.2 mg/dL      Bilirubin, UA NEGATIVE      Blood, UA NEGATIVE  RBC, UA None Seen /hpf      WBC, UA 6 - 10 (A) /hpf      Squamous Epithelial Cells, Urine 0 - 5 /hpf      Urine  Bacteria Occasional (A) /hpf     Hemoglobin A1c [id number] Collected:  06/03/14 1912    Specimen Information:  Blood Updated:  06/03/14 2155    Narrative:      Notify MD for Troponin value of greater than 1.0    TSH [id number] Collected:  06/03/14 1912    Specimen Information:  Blood Updated:  06/03/14 2032     Thyroid Stimulating Hormone 0.89 uIU/mL     Narrative:      Notify MD for Troponin value of greater than 1.0    Troponin I [id number] Collected:  06/03/14 1912    Specimen Information:  Blood Updated:  06/03/14 1956     Troponin I 0.01 ng/mL     Narrative:      Notify MD for Troponin value of greater than 1.0    Glucose Whole Blood - POCT [id number]  (Abnormal) Collected:  06/03/14 1614     POCT - Glucose Whole blood 116 (H) mg/dL Updated:  06/03/14 1616    Cell MorpHology [id number]  (Abnormal) Collected:  06/03/14 1113     Cell Morphology: Abnormal (A) Updated:  06/03/14 1244     Anisocytosis =1+ (A)      Poikilocytosis =1+ (A)      Microcytic =1+ (A)      Hypochromia =1+ (A)      Ovalocytes =1+ (A)      Burr Cells =1+ (A)      Platelet Estimate Normal     CBC with differential [id number]  (Abnormal) Collected:  06/03/14 1113    Specimen Information:  Blood / Blood Updated:  06/03/14 1243     WBC 7.95 x10 3/uL      RBC 4.87 x10 6/uL      Hgb 10.2 (L) g/dL      Hematocrit 32.3 (L) %      MCV 66.3 (L) fL      MCH 20.9 (L) pg      MCHC 31.6 (L) g/dL      RDW 15 %      Platelets 229 x10 3/uL      MPV 11.1 fL      Neutrophils 50 %      Lymphocytes Automated 40 %      Monocytes 6 %      Eosinophils Automated 3 %      Basophils Automated 0 %      Immature Granulocyte Unmeasured %      Nucleated RBC Unmeasured /100 WBC      Neutrophils Absolute 3.99 x10 3/uL      Abs Lymph Automated 3.19 x10 3/uL      Abs Mono Automated 0.51 x10 3/uL      Abs Eos Automated 0.22 x10 3/uL      Absolute Baso Automated 0.04 x10 3/uL      Absolute Immature Granulocyte Unmeasured x10 3/uL     Troponin I [id number] Collected:   06/03/14 1113    Specimen Information:  Blood Updated:  06/03/14 1148     Troponin I <0.01 ng/mL     GFR [id number] Collected:  06/03/14 1113     EGFR >60.0 Updated:  06/03/14 1148    Comprehensive metabolic panel [id number]  (Abnormal) Collected:  06/03/14 1113    Specimen Information:  Blood  Updated:  06/03/14 1148     Glucose 111 (H) mg/dL      BUN 16.0 mg/dL      Creatinine 0.9 mg/dL      Sodium 138 mEq/L      Potassium 4.2 mEq/L      Chloride 105 mEq/L      CO2 23 mEq/L      CALCIUM 9.8 mg/dL      Protein, Total 7.1 g/dL      Albumin 3.8 g/dL      AST (SGOT) 20 U/L      ALT 17 U/L      Alkaline Phosphatase 62 U/L      Bilirubin, Total 0.3 mg/dL      Globulin 3.3 g/dL      Albumin/Globulin Ratio 1.2      Anion Gap 10.0         Rads:   I have reviewed the Radiology reports.  Chest 2 Views    06/03/2014   Clinical history: Chest pain. Hypertension.  COMPARISON: 03/09/2013.  Chest, PA and lateral: There is spinal degenerative change. The cardiac, mediastinal, and hilar silhouettes are within normal limits. The lungs are clear.     06/03/2014    No acute process.  Nicholes Rough, MD  06/03/2014 11:51 AM     Ct Head Wo Contrast    06/03/2014   History: Hypertension, headache.  FINDINGS: Brain CT without intravenous contrast. Correlation with a brain CT dated March 11, 2013 and a prior MRI dated March 09, 2013.  Minor chronic small vessel ischemic changes in the supratentorial white matter and a more focal infarction the right anterior pons seen on the prior MRI are below CT resolution. There is no mass, acute intracranial hemorrhage. The ventricular system and cisterns are normally configured. There is minor mucosal thickening in the ethmoid air cells. There are atherosclerotic calcifications in the distal internal carotid arteries. The calvarium is intact.     06/03/2014    1. Chronic ischemic changes seen on the prior MRI are below CT resolution. 2. Intracranial atherosclerosis.  Dianne Dun, MD  06/03/2014  12:28 PM     Discharge Medications:        Discharge Medication List      Taking          aspirin EC 325 MG EC tablet   Dose:  325 mg   Take 1 tablet (325 mg total) by mouth daily.       glimepiride 4 MG tablet   Dose:  4 mg   What changed:  how much to take   Commonly known as:  AMARYL   Take 1 tablet (4 mg total) by mouth every morning with breakfast.       losartan 50 MG tablet   Dose:  50 mg   Commonly known as:  COZAAR   Take 50 mg by mouth daily.       metFORMIN 1000 MG tablet   Dose:  1000 mg   Commonly known as:  GLUCOPHAGE   Take 1 tablet (1,000 mg total) by mouth 2 (two) times daily with meals.       Omega-3 Fish Oil 500 MG Caps   Dose:  2 capsule   Take 2 capsules by mouth daily.       simvastatin 10 MG tablet   Dose:  10 mg   Commonly known as:  ZOCOR   Take 10 mg by mouth nightly.  STOP taking these medications          aspirin 325 MG tablet   Replaced by:  aspirin EC 325 MG EC tablet             Pending Labs:           Unresulted Labs     Procedure . . . Date/Time    MRI brain without contrast [id number] Resulted:  06/04/14 0952     Updated:  06/03/14 2308    Hemoglobin A1c [id number] Collected:  06/03/14 1912    Specimen Information:  Blood Updated:  06/03/14 2155    Narrative:      Notify MD for Troponin value of greater than 1.0         Discharge Destination:   Home   Condition at Discharge :   Stable  Labs/Images to be followed at your PCP office   Follow-up on the blood pressures at PCP office  Follow-up:     Follow-up Information     Follow up with Pcp, [facility], MD.         She was asked to follow-up with her PCP in the next 2 days.      Signed by: Clare Gandy, MD

## 2014-06-04 NOTE — Progress Notes (Signed)
Discharge Planning:    D/C Disposition: Home with no needs.   Expected D/C Date: 24 to 48 hrs.     Case Management Initial Discharge Planning Assessment     Psychosocial/Demographic Information   Name of interviewee/s:  Patient and her daughter [name] who is at bedside.    Orientation and decision making abilities of patient (ie a&ox3 able to make decisions, demented patient, patient on vent, etc)   AAox4 and bale to make decisions per daughter    Does the patient have an Advance Directive? Location? (home/on chart, if home-advised to bring in copy?) <no information>  Advance Directive: Patient does not have advance directive] Your right to decide packet given.    Healthcare Decision Maker (HDM) (if other than the patient) Include relationship and contact information.  Self.    Any additional emergency contacts? Extended Emergency Contact Information  Primary Emergency Contact: Casares,[name]  Address: [place], [place] [address] [name] Phone: [phone]  Mobile Phone: [phone]  Relation: Daughter   Pt lives with:  Living Arrangements: Children]   Type of residence where patient lives:    ]   ]   ]   ]   ]   Prior level of functioning (ambulation & ADL's)   ]The patient is fully independent with all ADL's and ambulation and does not use any equipment.    Support system-list  (i.e.church, friends, extended family, friends?)    Strong family support.   Do you want to designate an individual who will care for or assist you upon discharge? yes   If yes: Please list the name, relationship, phone number, and address of the designated individual. Name: [name] Loss  Relationship: daughter  Phone Number: [phone]  Address: same as pt.        Correct Insurance listed on face sheet - verified with the patient/HDM  No insurance, low income resources and [place] health exchange information given.       Discharge Planning Services in Place  Name of Primary Care  Physician verified in patient banner (update in patient banner if not listed) Pcp, [facility], MD  None     Patient is now a patient at the [facility].    PCP Follow up apptmt offered/set up Pts daughter requests to make appointment around her schedule.    What DME does the patient currently own? (rolling walker, hospital bed, home O2, BiPAP/CPAP, bedside commode, cane, hoyer lift)   None.    Are PT/OT services indicated? If so, has it been ordered?  Ordered and pt seen - rounded with Sharee Pimple OT who states pt will have no home needs at this time.    Has the patient been to an Acute Rehab or SNF in the past?  If so, where?   To [facility] in July following a stroke (char[facility]).    Does the patient currently have home health or hospice/palliative services in place?  If so, list agency name.   No   Does the patient already have community dialysis set up?  If so, where? no      Readmission Assessment  What is the current LACE score?  6   Is this patient an inpatient to inpatient 30 day readmission? no   Previous admission discharge diagnosis     Was patient readmitted from a facility?        Patient active with Home Health?  Patient active with Home Hospice?       Contributing factors to readmission (i.e., no follow up appt on previous d/c, unable to get meds, no insurance, no social support, etc.)    Did patient/family understand what medication was for and how to administer, symptoms to indicate worsening condition, activity and diet restrictions at time of previous d/c?               Anticipated Discharge Plan  Anticipated Disposition: Option A  Home with no needs.     CM introduced self and role and encouraged the patient to call if she has any questions or concern regarding discharge needs    Anticipated Disposition: Option B     Who will transport the patient upon discharge?  daughter   If applicable, were SNF or Hospice choices provided?  n/a   Palliative Care Consult needed? (if yes, contact  attending MD)       n/a at this time.          IInpatient Medicare/Medicare HMO Patients Only  Was an initial IMM signed within 24 hours of admission?  (Look in Media Tab, Documents Table or Shadow Chart)  n/a         Will continue to follow for discharge planning.       Georgianne Fick, RN, BSN  [facility]   Clinical Case Manager

## 2014-06-04 NOTE — Plan of Care (Signed)
Problem: Safety  Goal: Patient will be free from injury during hospitalization  Remained free from falls or injuries. Ambulatory.call light within reach, bedside table within reach. Daughter at the bedside. Safety measures discussed. Will continue to monitor.

## 2014-06-04 NOTE — OT Eval Note (Signed)
[facility]  [place]  [phone]    Occupational Therapy Evaluation    Patient: Jacqueline Weber    MRN#: [id number]     M201/M201-A    Time of treatment: Time Calculation  OT Received On: 06/04/14  Start Time: 0913  Stop Time: 0936  Time Calculation (min): 23 min       Consult received for [name] for OT Evaluation and Treatment.  Patient's medical condition is appropriate for Occupational therapy intervention at this time.    Assessment:   Jacqueline Weber is a 63 y.o. female admitted 06/03/2014 presenting with HTN urgency.  Impairments: Assessment: Appears to be at baseline for ADL's    Therapy Diagnosis: None    Rehabilitation Potential: Prognosis: Good;With family      Plan:   OT Frequency Recommended: one time visit   Treatment Interventions: No skilled interventions needed at this time     Patient Goal  Patient Goal:  (to go home)    Risks/Benefits/POC Discussed with Pt/Family: With patient/family    Goals: N/A due to no skilled OT intervention warranted at this time.                                       Discharge Recommendations:   Based on today's session patient's discharge recommendation is the following: Discharge Recommendation: Home with supervision.    DME Recommended for Discharge: Grab bars;Shower chair        Precautions and Contraindications: Falls Risk         Medical Diagnosis: Hyperlipidemia [272.4 (ICD-9-CM)]  History of CVA (cerebrovascular accident) [V12.54 (ICD-9-CM)]  Hypertensive urgency [401.9 (ICD-9-CM)]  Uncontrolled hypertension [401.9 (ICD-9-CM)]    History of Present Illness: Jacqueline Weber is a 63 y.o. female admitted on 06/03/2014 with dizziness, lightheadedness and unsteady gait. Patient's BP noted to be elevated at MD office.     Patient Active Problem List   Diagnosis   . R/o CVA   . Type 2 diabetes mellitus without complication   . Hypertensive emergency   . HTN (hypertension)  urgency   . CVA (cerebral vascular accident)   . Hyperlipidemia   . Hypertensive urgency   . Dizziness   . Unsteady gait   . History of CVA (cerebrovascular accident)   . Uncontrolled hypertension        Past Medical/Surgical History:  Past Medical History   Diagnosis Date   . Diabetes mellitus    . Hypertension    . Hypercholesteremia    . CVA (cerebral infarction) 03/2013   . Cataracts, bilateral       History reviewed. No pertinent past surgical history.        Social History:  Prior Level of Function  Driving: does not drive  Cooking: Yes  DME Currently at Home: Single point cane  Home Living Arrangements  Living Arrangements: Children  Home Layout: Multi-level, Bed/bath upstairs  Bathroom Shower/Tub: Tub/shower unit  DME Currently at Home: Single point cane      Subjective:   Patient is agreeable to participation in the therapy session. Family and/or guardian are agreeable to patient's participation in the therapy session. Nursing clears patient for therapy.  Subjective:  (Patient denies pain currently; States she feels back to baseline)   .        Objective:   Observation of Patient/Vital Signs:  Patient is in bed with telemetry and peripheral IV in place.         Cognition  Arousal/Alertness: Appropriate responses to stimuli  Attention Span: Appears intact  Orientation Level: Oriented X4  Memory: Appears intact  Following Commands: independent  Neuro Status  Behavior: attentive;calm;cooperative  Motor Planning: intact  Coordination: intact (for serial opposition and RAM)  Hand Dominance: right handed    Gross ROM  Right Upper Extremity ROM: within functional limits  Left Upper Extremity ROM: within functional limits  Gross Strength  Right Upper Extremity Strength: 4/5  Left Upper Extremity Strength: 4/5          Sensory  Auditory: intact  Tactile - Light Touch: intact (B UEs per Patient report; N/T first and second toe L foot due to prior CVA)  Visual Acuity: wears glasses (for reading)       Self-care and  Home Management  Grooming: Supervision;standing at sink;wash/dry hands;supervision/safety  LB Dressing: Supervision;sitting;edge of bed;Don/doff R sock;Don/doff L sock  Toileting: Supervision;supervison/safety;clothing management up;clothing management down;perineal hygiene    Mobility and Transfers  Supine to Sit: Supervision  Sit to Supine: Supervision  Sit to Stand: Supervision (from bed)  Bed <-->Toilet: Supervision w/o A.D.; no LOB noted  -verbal/tactile instructions for proper pacing with all functional transfers for increased safety.      Balance  Static Sitting Balance: good  Dyanamic Sitting Balance: good (seated at EOB, reaching forward towards feet)  Static Standing Balance: good  Dynamic Standing Balance: good    Participation and Endurance  Participation Effort: good  Endurance: Tolerates 10 - 20 min exercise with multiple rests; Patient denied feeling lightheaded or dizzy during the session.      Treatment Activities: Patient instructed in proper pacing with transitional mvmts, encouraging Patient to wait atleast 60 secs. To notice how she feels (lightheaded or dizzy) before attempting to stand/walk for fall prevention. Patient verbalized understanding of same. Patient educated in LB dressing techniques with recommendations made to sit to initiate pants/underwear over feet, then to stand to pull pants up over hips for fall prevention/energy conservation.Discussed home bathroom safety with recommendations made for shower seat and grab bars and for Patient to have Supervision with shower transfers upon d/c to home to ensure safety/fall prevention. Patient ageed to same. Patient educated on s/s of stroke and importance of seeking medical attention immediately. Patient and Patient's daughter appeared receptive to all education provided.     Educated the patient to role of occupational therapy, plan of care, goals of therapy and HEP, safety with mobility and ADLs, home safety.      Hewitt Blade. Rigoberto Noel,  MS,OTR/L  Pager # [phone]  [phone]

## 2014-06-05 ENCOUNTER — Telehealth: Payer: Self-pay

## 2014-06-05 NOTE — Telephone Encounter (Signed)
Transitional Care Management    Initial call to patient/Jacqueline Weber to check on status since discharge from the hospital and to offer the TCM/DM program-spoke to daughter/Jacqueline [place] who states her mother is doing fine-states she is a patient of the [employer] she has her medications-explained the [employer] and daughter declined-she did take my contact information and knows she can call in the future with any questions-TCM case closed    Charise Carwin, RN, CCM  Case Training and development officer Transitional Services  T [phone]

## 2015-04-12 ENCOUNTER — Other Ambulatory Visit: Payer: Self-pay | Admitting: Physician Assistant

## 2015-04-12 DIAGNOSIS — R011 Cardiac murmur, unspecified: Secondary | ICD-10-CM

## 2015-04-16 ENCOUNTER — Other Ambulatory Visit: Payer: Self-pay

## 2015-04-21 ENCOUNTER — Other Ambulatory Visit: Payer: Self-pay

## 2015-04-28 ENCOUNTER — Other Ambulatory Visit: Payer: Self-pay

## 2015-05-12 ENCOUNTER — Ambulatory Visit (INDEPENDENT_AMBULATORY_CARE_PROVIDER_SITE_OTHER): Payer: Self-pay

## 2015-05-12 ENCOUNTER — Ambulatory Visit
Admission: RE | Admit: 2015-05-12 | Discharge: 2015-05-12 | Disposition: A | Discharge status: Home / Self Care | Payer: Self-pay | Source: Ambulatory Visit | Attending: Physician Assistant | Admitting: Physician Assistant

## 2015-05-12 DIAGNOSIS — I358 Other nonrheumatic aortic valve disorders: Secondary | ICD-10-CM | POA: Insufficient documentation

## 2015-05-12 DIAGNOSIS — R011 Cardiac murmur, unspecified: Secondary | ICD-10-CM | POA: Insufficient documentation

## 2015-05-12 DIAGNOSIS — I351 Nonrheumatic aortic (valve) insufficiency: Secondary | ICD-10-CM | POA: Insufficient documentation

## 2015-05-12 DIAGNOSIS — I519 Heart disease, unspecified: Secondary | ICD-10-CM | POA: Insufficient documentation

## 2015-09-22 ENCOUNTER — Emergency Department
Admission: EM | Admit: 2015-09-22 | Discharge: 2015-09-22 | Disposition: A | Discharge status: Home / Self Care | Payer: Charity | Attending: Emergency Medicine | Admitting: Emergency Medicine

## 2015-09-22 ENCOUNTER — Emergency Department: Payer: Self-pay

## 2015-09-22 ENCOUNTER — Emergency Department: Payer: Charity

## 2015-09-22 DIAGNOSIS — E871 Hypo-osmolality and hyponatremia: Secondary | ICD-10-CM | POA: Insufficient documentation

## 2015-09-22 DIAGNOSIS — E119 Type 2 diabetes mellitus without complications: Secondary | ICD-10-CM | POA: Insufficient documentation

## 2015-09-22 DIAGNOSIS — Z8673 Personal history of transient ischemic attack (TIA), and cerebral infarction without residual deficits: Secondary | ICD-10-CM | POA: Insufficient documentation

## 2015-09-22 DIAGNOSIS — Z7984 Long term (current) use of oral hypoglycemic drugs: Secondary | ICD-10-CM | POA: Insufficient documentation

## 2015-09-22 DIAGNOSIS — Z880 Allergy status to penicillin: Secondary | ICD-10-CM | POA: Insufficient documentation

## 2015-09-22 DIAGNOSIS — Z7982 Long term (current) use of aspirin: Secondary | ICD-10-CM | POA: Insufficient documentation

## 2015-09-22 DIAGNOSIS — Z888 Allergy status to other drugs, medicaments and biological substances status: Secondary | ICD-10-CM | POA: Insufficient documentation

## 2015-09-22 DIAGNOSIS — E7800 Pure hypercholesterolemia, unspecified: Secondary | ICD-10-CM | POA: Insufficient documentation

## 2015-09-22 DIAGNOSIS — I10 Essential (primary) hypertension: Secondary | ICD-10-CM | POA: Insufficient documentation

## 2015-09-22 DIAGNOSIS — IMO0001 Reserved for inherently not codable concepts without codable children: Secondary | ICD-10-CM

## 2015-09-22 DIAGNOSIS — D649 Anemia, unspecified: Secondary | ICD-10-CM | POA: Insufficient documentation

## 2015-09-22 LAB — CELL MORPHOLOGY
Cell Morphology: ABNORMAL — AB
Platelet Estimate: NORMAL

## 2015-09-22 LAB — COMPREHENSIVE METABOLIC PANEL
ALT: 13 U/L (ref 0–55)
AST (SGOT): 16 U/L (ref 5–34)
Albumin/Globulin Ratio: 1.2 (ref 0.9–2.2)
Albumin: 3.8 g/dL (ref 3.5–5.0)
Alkaline Phosphatase: 57 U/L (ref 37–106)
Anion Gap: 8 (ref 5.0–15.0)
BUN: 28 mg/dL — ABNORMAL HIGH (ref 7.0–19.0)
Bilirubin, Total: 0.3 mg/dL (ref 0.2–1.2)
CO2: 23 mEq/L (ref 22–29)
Calcium: 9.1 mg/dL (ref 8.5–10.5)
Chloride: 102 mEq/L (ref 100–111)
Creatinine: 1 mg/dL (ref 0.6–1.0)
Globulin: 3.3 g/dL (ref 2.0–3.6)
Glucose: 108 mg/dL — ABNORMAL HIGH (ref 70–100)
Potassium: 4.9 mEq/L (ref 3.5–5.1)
Protein, Total: 7.1 g/dL (ref 6.0–8.3)
Sodium: 133 mEq/L — ABNORMAL LOW (ref 136–145)

## 2015-09-22 LAB — CBC AND DIFFERENTIAL
Basophils Absolute Automated: 0.03 10*3/uL (ref 0.00–0.20)
Basophils Automated: 0 %
Eosinophils Absolute Automated: 0.24 10*3/uL (ref 0.00–0.70)
Eosinophils Automated: 3 %
Hematocrit: 28.7 % — ABNORMAL LOW (ref 37.0–47.0)
Hgb: 9 g/dL — ABNORMAL LOW (ref 12.0–16.0)
Lymphocytes Absolute Automated: 1.88 10*3/uL (ref 0.50–4.40)
Lymphocytes Automated: 25 %
MCH: 20.7 pg — ABNORMAL LOW (ref 28.0–32.0)
MCHC: 31.4 g/dL — ABNORMAL LOW (ref 32.0–36.0)
MCV: 66.1 fL — ABNORMAL LOW (ref 80.0–100.0)
MPV: 11.6 fL (ref 9.4–12.3)
Monocytes Absolute Automated: 0.69 10*3/uL (ref 0.00–1.20)
Monocytes: 9 %
Neutrophils Absolute: 4.66 10*3/uL (ref 1.80–8.10)
Neutrophils: 62 %
Platelets: 246 10*3/uL (ref 140–400)
RBC: 4.34 10*6/uL (ref 4.20–5.40)
RDW: 14 % (ref 12–15)
WBC: 7.5 10*3/uL (ref 3.50–10.80)

## 2015-09-22 LAB — URINALYSIS
Bilirubin, UA: NEGATIVE
Glucose, UA: NEGATIVE
Ketones UA: NEGATIVE
Leukocyte Esterase, UA: NEGATIVE
Nitrite, UA: NEGATIVE
Protein, UR: 100 — AB
Specific Gravity UA: 1.009 (ref 1.001–1.035)
Urine pH: 6.5 (ref 5.0–8.0)
Urobilinogen, UA: 0.2 mg/dL (ref 0.2–2.0)

## 2015-09-22 LAB — ECG 12-LEAD
Atrial Rate: 75 {beats}/min
P Axis: 10 degrees
P-R Interval: 162 ms
Q-T Interval: 370 ms
QRS Duration: 82 ms
QTC Calculation (Bezet): 413 ms
R Axis: 23 degrees
T Axis: 6 degrees
Ventricular Rate: 75 {beats}/min

## 2015-09-22 LAB — URINE MICROSCOPIC

## 2015-09-22 LAB — TROPONIN I: Troponin I: 0.02 ng/mL (ref 0.00–0.09)

## 2015-09-22 LAB — GFR: EGFR: 56

## 2015-09-22 LAB — B-TYPE NATRIURETIC PEPTIDE: B-Natriuretic Peptide: 64 pg/mL (ref 0–100)

## 2015-09-22 MED ORDER — HYDRALAZINE HCL 20 MG/ML IJ SOLN
10.0000 mg | Freq: Once | INTRAMUSCULAR | Status: AC
Start: 2015-09-22 — End: 2015-09-22
  Administered 2015-09-22: 10 mg via INTRAVENOUS
  Filled 2015-09-22: qty 1

## 2015-09-22 NOTE — ED Notes (Signed)
Sent by [facility] for eval for hypertension.  +dizziness +fatigue.

## 2015-09-22 NOTE — Discharge Instructions (Signed)
Please rest and avoid aggravating activities.  Please follow-up. Return to the ER for any concerns.     Hypertension    You have been diagnosed with elevated blood pressure.    The medical term for high blood pressure is hypertension. Many people feel anxious or uncomfortable about being at the hospital. If you feel anxious today, this could make your blood pressure appear high, even if your blood pressure is usually normal. Check your blood pressure several more times when you are not feeling stress. Keep a record of these readings and give this information to your regular doctor. He or she will decide whether you have hypertension that requires medical treatment.    If your blood pressure becomes extremely high all of a sudden, you will probably notice symptoms. In fact, very high blood pressure is a medical emergency. Most people with hypertension have blood pressure that is only a little too high. Mild high blood pressure does not cause specific symptoms. Instead, the effects of hypertension develop slowly over time. Untreated hypertension can affect the heart, brain, kidneys, eyes, and blood vessels. Unfortunately, by the time side-effects become noticeable, the body has already been damaged. This is why hypertension is called "the silent killer!"    It is important to follow up with your regular doctor. Check your blood pressure several times in the next 1 to 2 weeks and tell your doctor about the results. It may be helpful to keep a log or a journal where you can write down your blood pressures. Note the time of day and the activity you were doing when the reading was taken.    YOU SHOULD SEEK MEDICAL ATTENTION IMMEDIATELY, EITHER HERE OR AT THE NEAREST EMERGENCY DEPARTMENT, IF ANY OF THE FOLLOWING OCCURS:   You have a headache.   You have chest pain.   You are short of breath or have trouble breathing.    You feel weak, especially on only one side of the body.   Your symptoms get worse or you have  other concerns.              Anemia, Chronic    You have been seen for your chronic anemia (low blood count).    Anemia means "a low red blood cell count." Red blood cells are a part of your blood. These carry oxygen. Blood also has white blood cells, which fight infection and platelets, which help blood to clot.    Symptoms of anemia include fatigue (feeling tired) and weakness. Symptoms also include shortness of breath or chest pain with exercise or even normal activity. Another sign is pale color of the skin, lips and fingernail beds.    After an evaluation, the doctor thinks your blood count IS NOT so low that you need a blood transfusion. Follow-up with your regular doctor for more rechecks on the blood count.    YOU SHOULD SEEK MEDICAL ATTENTION IMMEDIATELY, EITHER HERE OR AT THE NEAREST EMERGENCY DEPARTMENT, IF ANY OF THE FOLLOWING OCCURS:   You get light-headed and dizzy as if about to faint or have worsening shortness of breath and/or chest pain during normal activity like walking or climbing stairs.   You have any new sources of bleeding.

## 2015-09-22 NOTE — ED Provider Notes (Signed)
Physician/Midlevel provider first contact with patient: 09/22/15 1443         History     Chief Complaint   Patient presents with   . Hypertension   . Fatigue     HPI Comments: 65 year old [facility].  Daughter reports long history of poorly controlled blood pressure.  Despite the patient being compliant with her medications.  She reports that the patient has a history of shortness of breath which is not new.  Patient does complain of some intermittent photophobia, without accompanying headache, blurry vision or dizziness.  No chest pain or shortness of breath.  Patient denies any pain or radiation.  Patient took all medications as prescribed today.    Patient is a 65 y.o. female presenting with hypertension. The history is provided by the patient (daughter, free clinic staff). The history is limited by a language barrier. A language interpreter was used (daughter at patient request).   Hypertension  Severity:  Moderate  Onset quality:  Unable to specify  Timing:  Unable to specify  Progression:  Unable to specify  Chronicity:  Chronic  Notable PTA blood pressures:  200's  Context: medication change    Relieved by:  None tried  Worsened by:  Nothing  Ineffective treatments:  None tried  Associated symptoms: shortness of breath    Associated symptoms: no abdominal pain, no anxiety, no blurred vision, no chest pain, no dizziness, no fatigue, no fever, no headaches, no hematuria, no loss of consciousness, no nausea, no neck pain, no palpitations, no syncope, not vomiting and no weakness    Shortness of breath:     Severity:  Mild    Onset quality:  Gradual    Timing:  Constant    Progression:  Unchanged  Risk factors: no cocaine use             Past Medical History   Diagnosis Date   . Diabetes mellitus    . Hypertension    . Hypercholesteremia    . CVA (cerebral infarction) 03/2013   . Cataracts, bilateral        No past surgical history on file.   Surgical History  reviewed: patient denies     No family history on file.   Family history does not contribute     Social-lives with family,   Social History   Substance Use Topics   . Smoking status: Never Smoker    . Smokeless tobacco: None   . Alcohol Use: No       .     Allergies   Allergen Reactions   . Lisinopril    . Penicillins        Home Medications     Last Medication Reconciliation Action:  Complete Rona Ravens, RN 09/22/2015  2:50 PM                  aspirin EC 325 MG EC tablet     Take 1 tablet (325 mg total) by mouth daily.     calcium citrate (CALCITRATE) 950 MG tablet     Take 1 tablet by mouth daily.     carvedilol (COREG) 25 MG tablet     Take 25 mg by mouth.     fluticasone (FLONASE) 50 MCG/ACT nasal spray     1 spray by Nasal route daily.     glimepiride (AMARYL) 4 MG tablet     Take 1 tablet (4 mg total)  by mouth every morning with breakfast.     Patient taking differently:  Take 2 mg by mouth every morning with breakfast.        hydroCHLOROthiazide (HYDRODIURIL) 25 MG tablet     Take 25 mg by mouth daily.     losartan (COZAAR) 50 MG tablet     Take 1 tablet (50 mg total) by mouth 2 (two) times daily.     Patient taking differently:  Take 100 mg by mouth 2 (two) times daily.        metFORMIN (GLUCOPHAGE) 1000 MG tablet     Take 1 tablet (1,000 mg total) by mouth 2 (two) times daily with meals.     simvastatin (ZOCOR) 10 MG tablet     Take 10 mg by mouth nightly.           Review of Systems   Constitutional: Negative for fever, chills and fatigue.   HENT: Negative for congestion and rhinorrhea.    Eyes: Positive for photophobia. Negative for blurred vision, pain, discharge, redness, itching and visual disturbance.   Respiratory: Positive for shortness of breath. Negative for cough.    Cardiovascular: Negative for chest pain, palpitations, leg swelling and syncope.   Gastrointestinal: Negative for nausea, vomiting and abdominal pain.   Genitourinary: Negative for dysuria, urgency, frequency, hematuria and  flank pain.   Musculoskeletal: Negative for back pain, gait problem, neck pain and neck stiffness.   Skin: Negative for color change, pallor and wound.   Neurological: Negative for dizziness, tremors, seizures, loss of consciousness, syncope, facial asymmetry, speech difficulty, weakness, light-headedness, numbness and headaches.   Hematological: Does not bruise/bleed easily.   Psychiatric/Behavioral: Negative for self-injury. The patient is not nervous/anxious.        Physical Exam    BP: (!) 230/88 mmHg, Heart Rate: 74, Temp: 97 F (36.1 C), Resp Rate: 16, SpO2: 99 %, Weight: 72.122 kg    Physical Exam   Constitutional: She is oriented to person, place, and time. She appears well-developed and well-nourished. No distress.   Pt resting comfortably in NAD   HENT:   Head: Normocephalic and atraumatic.   Right Ear: External ear normal.   Left Ear: External ear normal.   Nose: Nose normal.   Mouth/Throat: Oropharynx is clear and moist. No oropharyngeal exudate.   Eyes: Conjunctivae and EOM are normal. Pupils are equal, round, and reactive to light. Right eye exhibits no discharge. Left eye exhibits no discharge. No scleral icterus.   Neck: Normal range of motion. Neck supple. No JVD present. No tracheal deviation present.   Cardiovascular: Normal rate and regular rhythm.    Pulmonary/Chest: Effort normal and breath sounds normal. No respiratory distress. She has no wheezes. She has no rales. She exhibits no tenderness.   Abdominal: Soft. Bowel sounds are normal. She exhibits no distension and no mass. There is no tenderness. There is no rebound and no guarding.   Musculoskeletal: Normal range of motion. She exhibits no edema or tenderness.   Neurological: She is alert and oriented to person, place, and time. She displays normal reflexes. No cranial nerve deficit. She exhibits normal muscle tone. Coordination normal.   Skin: Skin is warm and dry. No rash noted. She is not diaphoretic.   Psychiatric: She has a normal  mood and affect. Her behavior is normal. Judgment and thought content normal.   Nursing note and vitals reviewed.        MDM and ED Course     ED Medication  Orders     Start Ordered     Status Ordering Provider    09/22/15 1516 09/22/15 1515  hydrALAZINE (APRESOLINE) injection 10 mg   Once     Route: Intravenous  Ordered Dose: 10 mg     Last MAR action:  Given CONCAUGH-GRUENDEL, Jaidah Lomax ELIZABETH             MDM  Number of Diagnoses or Management Options  Anemia, unspecified:   Elevated blood pressure:   Hyponatremia:   Diagnosis management comments: I, Domenic Polite, PA-C, have been the primary provider for Jacqueline Weber during this Emergency Dept visit. The attending signature signifies review and agreement of the history, physical exam, evaluation, clinical impression and plan except as noted.   I have reviewed the nursing notes, including Past medical and surgical,Family and Social History     Oxygen Saturation by Pulse Oximetry  is 95%-100% - normal, no interventions needed     EKG Interpretation  EKG interpreted by ED physician  Rate: Normal for age.  Rhythm: Normal sinus rhythm  Axis: Normal for age  PR, QRS and QT intervals:  normal for age and rate  ST Segments: No deviations suggestive of ischemia  Impression: Normal ECG with no evidence of ischemia.    Differential diagnosis: Hypertensive urgency, hypertensive emergency, elevated blood pressure, poorly controlled blood pressure, acute kidney injury, renal insufficiency    Labs reviewed by me, H and H decreased 9.0/20.7 with anemia.  Patient with history of same, BUN elevated 28.0, sodium decreased at 33 consistent with hyponatremia    Multiple reassessments of the patient. Pt resting comfortably in NAD. Pt with relief after meds. Discussed with patient need for rest, avoid aggravating activities and follow-up. Return to the ER for any concerns. Pt voices understanding. No questions.       " *This note was generated by the Epic EMR system/  Dragon speech recognition and   may contain inherent errors or omissions not intended by the user. Grammatical   errors, random word insertions, deletions, pronoun errors and incomplete   sentences are occasional consequences of this technology due to software   limitations. Not all errors are caught or corrected. If there are questions or   concerns about the content of this note or information contained within the body    of this dictation they should be addressed directly with the author for   Clarification."                   Amount and/or Complexity of Data Reviewed  Clinical lab tests: ordered and reviewed  Tests in the radiology section of CPT: ordered and reviewed  Discuss the patient with other providers: yes  Independent visualization of images, tracings, or specimens: yes    Risk of Complications, Morbidity, and/or Mortality  Presenting problems: moderate  Management options: moderate    Patient Progress  Patient progress: stable            Procedures  Results     Procedure Component Value Units Date/Time    B-type Natriuretic Peptide [id number] Collected:  09/22/15 1506    Specimen Information:  Blood Updated:  09/22/15 1628     B-Natriuretic Peptide 64 pg/mL     Cell MorpHology [id number]  (Abnormal) Collected:  09/22/15 1506     Cell Morphology: Abnormal (A) Updated:  09/22/15 1559     Anisocytosis =2+ (A)      Microcytic =2+ (A)      Polychromasia =1+ (A)  Target Cells =1+ (A)      Schistocytes =1+ (A)      Tear Drop Cells =1+ (A)      Platelet Estimate Normal     CBC with differential [id number]  (Abnormal) Collected:  09/22/15 1506    Specimen Information:  Blood from Blood Updated:  09/22/15 1559     WBC 7.50 x10 3/uL      Hgb 9.0 (L) g/dL      Hematocrit 28.7 (L) %      Platelets 246 x10 3/uL      RBC 4.34 x10 6/uL      MCV 66.1 (L) fL      MCH 20.7 (L) pg      MCHC 31.4 (L) g/dL      RDW 14 %      MPV 11.6 fL      Neutrophils 62 %      Lymphocytes Automated 25 %      Monocytes 9 %       Eosinophils Automated 3 %      Basophils Automated 0 %      Immature Granulocyte Unmeasured %      Nucleated RBC Unmeasured /100 WBC      Neutrophils Absolute 4.66 x10 3/uL      Abs Lymph Automated 1.88 x10 3/uL      Abs Mono Automated 0.69 x10 3/uL      Abs Eos Automated 0.24 x10 3/uL      Absolute Baso Automated 0.03 x10 3/uL      Absolute Immature Granulocyte Unmeasured x10 3/uL     UA with reflex to micro (pts  3 + yrs) [id number]  (Abnormal) Collected:  09/22/15 1506    Specimen Information:  Urine from Urine, Clean Catch Updated:  09/22/15 1546     Urine Type Clean Catch      Color, UA YELLOW      Clarity, UA CLEAR      Specific Gravity UA 1.009      Urine pH 6.5      Leukocyte Esterase, UA NEGATIVE      Nitrite, UA NEGATIVE      Protein, UR 100 (A)      Glucose, UA NEGATIVE      Ketones UA NEGATIVE      Urobilinogen, UA 0.2 mg/dL      Bilirubin, UA NEGATIVE      Blood, UA SMALL (A)     Microscopic, Urine [id number] Collected:  09/22/15 1506     RBC, UA 0 - 5 /hpf Updated:  09/22/15 1546     WBC, UA 0 - 5 /hpf      Squamous Epithelial Cells, Urine 0 - 5 /hpf     Comprehensive metabolic panel [id number]  (Abnormal) Collected:  09/22/15 1506    Specimen Information:  Blood Updated:  09/22/15 1540     Glucose 108 (H) mg/dL      BUN 28.0 (H) mg/dL      Creatinine 1.0 mg/dL      Sodium 133 (L) mEq/L      Potassium 4.9 mEq/L      Chloride 102 mEq/L      CO2 23 mEq/L      Calcium 9.1 mg/dL      Protein, Total 7.1 g/dL      Albumin 3.8 g/dL      AST (SGOT) 16 U/L      ALT 13 U/L      Alkaline Phosphatase 57 U/L  Bilirubin, Total 0.3 mg/dL      Globulin 3.3 g/dL      Albumin/Globulin Ratio 1.2      Anion Gap 8.0     GFR [id number] Collected:  09/22/15 1506     EGFR 56.0 Updated:  09/22/15 1540    Troponin I [id number] Collected:  09/22/15 1506    Specimen Information:  Blood Updated:  09/22/15 1537     Troponin I 0.02 ng/mL           Radiology Results (24 Hour)     Procedure Component Value Units Date/Time     XR Chest 2 Views [id number] Collected:  09/22/15 1537    Order Status:  Completed Updated:  09/22/15 1550    Narrative:      CLINICAL HISTORY: Elevated blood pressures.     PA and lateral radiographs of the chest were obtained.     COMPARISON:  06/03/2014..    The cardiac silhouette is within normal limits. Minimal crowding of  markings is seen within the lung bases. There is no evidence of  congestive heart failure, focal pneumonia, pleural effusion or  pneumothorax. Degenerative changes are seen within the dorsal spine.      Impression:        Minimal crowding of markings within the lung bases - grossly unchanged.  There is no acute pneumonia.    Burnis Medin, MD   09/22/2015 3:46 PM              Clinical Impression & Disposition     Clinical Impression  Final diagnoses:   Anemia, unspecified   Elevated blood pressure   Hyponatremia        ED Disposition     Discharge Jacqueline Weber discharge to home/self care.    Condition at disposition: Stable             Discharge Medication List as of 09/22/2015  4:28 PM                      Concaugh-Gruendel, Melina Copa, PA  09/22/15 2005

## 2015-09-22 NOTE — ED Notes (Signed)
Patient discharged to home with written and verbal instructions. Patient verbalized understanding.  Patient left facility with all belongings.

## 2015-09-30 ENCOUNTER — Observation Stay: Payer: Self-pay | Admitting: Internal Medicine

## 2015-09-30 ENCOUNTER — Observation Stay
Admission: EM | Admit: 2015-09-30 | Discharge: 2015-10-01 | Disposition: A | Discharge status: Home / Self Care | Payer: Charity | Attending: Internal Medicine | Admitting: Internal Medicine

## 2015-09-30 ENCOUNTER — Emergency Department: Payer: Charity

## 2015-09-30 ENCOUNTER — Inpatient Hospital Stay: Payer: Charity

## 2015-09-30 DIAGNOSIS — R079 Chest pain, unspecified: Secondary | ICD-10-CM

## 2015-09-30 DIAGNOSIS — Z880 Allergy status to penicillin: Secondary | ICD-10-CM | POA: Insufficient documentation

## 2015-09-30 DIAGNOSIS — E785 Hyperlipidemia, unspecified: Secondary | ICD-10-CM | POA: Diagnosis present

## 2015-09-30 DIAGNOSIS — E875 Hyperkalemia: Secondary | ICD-10-CM

## 2015-09-30 DIAGNOSIS — I10 Essential (primary) hypertension: Secondary | ICD-10-CM | POA: Diagnosis present

## 2015-09-30 DIAGNOSIS — Z8673 Personal history of transient ischemic attack (TIA), and cerebral infarction without residual deficits: Secondary | ICD-10-CM

## 2015-09-30 DIAGNOSIS — R791 Abnormal coagulation profile: Secondary | ICD-10-CM

## 2015-09-30 DIAGNOSIS — D649 Anemia, unspecified: Secondary | ICD-10-CM

## 2015-09-30 DIAGNOSIS — Z7982 Long term (current) use of aspirin: Secondary | ICD-10-CM | POA: Insufficient documentation

## 2015-09-30 DIAGNOSIS — R110 Nausea: Secondary | ICD-10-CM

## 2015-09-30 DIAGNOSIS — E871 Hypo-osmolality and hyponatremia: Secondary | ICD-10-CM | POA: Insufficient documentation

## 2015-09-30 DIAGNOSIS — E782 Mixed hyperlipidemia: Secondary | ICD-10-CM | POA: Diagnosis present

## 2015-09-30 DIAGNOSIS — Z794 Long term (current) use of insulin: Secondary | ICD-10-CM | POA: Insufficient documentation

## 2015-09-30 DIAGNOSIS — R0602 Shortness of breath: Secondary | ICD-10-CM

## 2015-09-30 DIAGNOSIS — R072 Precordial pain: Secondary | ICD-10-CM

## 2015-09-30 DIAGNOSIS — E7800 Pure hypercholesterolemia, unspecified: Secondary | ICD-10-CM | POA: Insufficient documentation

## 2015-09-30 DIAGNOSIS — E119 Type 2 diabetes mellitus without complications: Secondary | ICD-10-CM | POA: Diagnosis present

## 2015-09-30 DIAGNOSIS — R0789 Other chest pain: Principal | ICD-10-CM | POA: Insufficient documentation

## 2015-09-30 DIAGNOSIS — I169 Hypertensive crisis, unspecified: Secondary | ICD-10-CM | POA: Insufficient documentation

## 2015-09-30 DIAGNOSIS — R7989 Other specified abnormal findings of blood chemistry: Secondary | ICD-10-CM

## 2015-09-30 DIAGNOSIS — Z7984 Long term (current) use of oral hypoglycemic drugs: Secondary | ICD-10-CM | POA: Insufficient documentation

## 2015-09-30 DIAGNOSIS — D509 Iron deficiency anemia, unspecified: Secondary | ICD-10-CM | POA: Insufficient documentation

## 2015-09-30 LAB — CBC AND DIFFERENTIAL
Basophils Absolute Automated: 0.02 10*3/uL (ref 0.00–0.20)
Basophils Automated: 0 %
Eosinophils Absolute Automated: 0.2 10*3/uL (ref 0.00–0.70)
Eosinophils Automated: 4 %
Hematocrit: 26.9 % — ABNORMAL LOW (ref 37.0–47.0)
Hgb: 8.6 g/dL — ABNORMAL LOW (ref 12.0–16.0)
Immature Granulocytes Absolute: 0.01 10*3/uL
Immature Granulocytes: 0 %
Lymphocytes Absolute Automated: 1.27 10*3/uL (ref 0.50–4.40)
Lymphocytes Automated: 23 %
MCH: 20.8 pg — ABNORMAL LOW (ref 28.0–32.0)
MCHC: 32 g/dL (ref 32.0–36.0)
MCV: 65 fL — ABNORMAL LOW (ref 80.0–100.0)
MPV: 10.3 fL (ref 9.4–12.3)
Monocytes Absolute Automated: 0.66 10*3/uL (ref 0.00–1.20)
Monocytes: 12 %
Neutrophils Absolute: 3.27 10*3/uL (ref 1.80–8.10)
Neutrophils: 60 %
Platelets: 242 10*3/uL (ref 140–400)
RBC: 4.14 10*6/uL — ABNORMAL LOW (ref 4.20–5.40)
RDW: 14 % (ref 12–15)
WBC: 5.42 10*3/uL (ref 3.50–10.80)

## 2015-09-30 LAB — COMPREHENSIVE METABOLIC PANEL
ALT: 10 U/L (ref 0–55)
AST (SGOT): 19 U/L (ref 5–34)
Albumin/Globulin Ratio: 1.2 (ref 0.9–2.2)
Albumin: 3.6 g/dL (ref 3.5–5.0)
Alkaline Phosphatase: 65 U/L (ref 37–106)
Anion Gap: 12 (ref 5.0–15.0)
BUN: 24.5 mg/dL — ABNORMAL HIGH (ref 7.0–19.0)
Bilirubin, Total: 0.2 mg/dL (ref 0.2–1.2)
CO2: 20 mEq/L — ABNORMAL LOW (ref 22–29)
Calcium: 9.7 mg/dL (ref 8.5–10.5)
Chloride: 93 mEq/L — ABNORMAL LOW (ref 100–111)
Creatinine: 1.1 mg/dL — ABNORMAL HIGH (ref 0.6–1.0)
Globulin: 3.1 g/dL (ref 2.0–3.6)
Glucose: 150 mg/dL — ABNORMAL HIGH (ref 70–100)
Potassium: 5.2 mEq/L — ABNORMAL HIGH (ref 3.5–5.1)
Protein, Total: 6.7 g/dL (ref 6.0–8.3)
Sodium: 125 mEq/L — ABNORMAL LOW (ref 136–145)

## 2015-09-30 LAB — TROPONIN I
Troponin I: <0.01 ng/mL (ref 0.00–0.09)
Troponin I: 0.01 ng/mL (ref 0.00–0.09)

## 2015-09-30 LAB — GFR: EGFR: 50.2

## 2015-09-30 LAB — CELL MORPHOLOGY
Cell Morphology: ABNORMAL — AB
Platelet Estimate: NORMAL

## 2015-09-30 LAB — IHS D-DIMER: D-Dimer: 1.6 ug/mL FEU — ABNORMAL HIGH (ref 0.00–0.51)

## 2015-09-30 LAB — MAGNESIUM: Magnesium: 2.1 mg/dL (ref 1.6–2.6)

## 2015-09-30 MED ORDER — NALOXONE HCL 0.4 MG/ML IJ SOLN (WRAP)
0.2000 mg | INTRAMUSCULAR | Status: DC | PRN
Start: 2015-09-30 — End: 2015-10-01

## 2015-09-30 MED ORDER — ASPIRIN 81 MG PO CHEW
81.0000 mg | CHEWABLE_TABLET | Freq: Every day | ORAL | Status: DC
Start: 2015-10-01 — End: 2015-10-01
  Administered 2015-10-01: 81 mg via ORAL
  Filled 2015-09-30: qty 1

## 2015-09-30 MED ORDER — ASPIRIN 81 MG PO CHEW
324.0000 mg | CHEWABLE_TABLET | Freq: Once | ORAL | Status: AC
Start: 2015-09-30 — End: 2015-09-30
  Administered 2015-09-30: 324 mg via ORAL
  Filled 2015-09-30: qty 4

## 2015-09-30 MED ORDER — DEXTROSE 50 % IV SOLN
25.0000 mL | INTRAVENOUS | Status: DC | PRN
Start: 2015-09-30 — End: 2015-10-01

## 2015-09-30 MED ORDER — GLIMEPIRIDE 2 MG PO TABS
2.0000 mg | ORAL_TABLET | Freq: Every morning | ORAL | Status: DC
Start: 2015-10-01 — End: 2015-10-01
  Administered 2015-10-01: 2 mg via ORAL
  Filled 2015-09-30: qty 1

## 2015-09-30 MED ORDER — SIMVASTATIN 10 MG PO TABS
10.0000 mg | ORAL_TABLET | Freq: Every evening | ORAL | Status: DC
Start: 2015-09-30 — End: 2015-10-01
  Administered 2015-09-30: 10 mg via ORAL
  Filled 2015-09-30: qty 1

## 2015-09-30 MED ORDER — INSULIN LISPRO 100 UNIT/ML SC SOLN
1.0000 [IU] | Freq: Three times a day (TID) | SUBCUTANEOUS | Status: DC | PRN
Start: 2015-09-30 — End: 2015-10-01
  Administered 2015-10-01: 1 [IU] via SUBCUTANEOUS
  Filled 2015-09-30: qty 3

## 2015-09-30 MED ORDER — INSULIN LISPRO 100 UNIT/ML SC SOLN
1.0000 [IU] | Freq: Every evening | SUBCUTANEOUS | Status: DC | PRN
Start: 2015-09-30 — End: 2015-10-01

## 2015-09-30 MED ORDER — FAMOTIDINE 20 MG PO TABS
20.0000 mg | ORAL_TABLET | Freq: Two times a day (BID) | ORAL | Status: DC
Start: 2015-09-30 — End: 2015-09-30

## 2015-09-30 MED ORDER — GLUCOSE 40 % PO GEL
15.0000 g | ORAL | Status: DC | PRN
Start: 2015-09-30 — End: 2015-10-01

## 2015-09-30 MED ORDER — FAMOTIDINE 20 MG PO TABS
20.0000 mg | ORAL_TABLET | Freq: Every day | ORAL | Status: DC
Start: 2015-10-01 — End: 2015-10-01
  Administered 2015-10-01: 20 mg via ORAL
  Filled 2015-09-30: qty 1

## 2015-09-30 MED ORDER — SODIUM CHLORIDE 0.9 % IV SOLN
INTRAVENOUS | Status: DC
Start: 2015-09-30 — End: 2015-10-01

## 2015-09-30 MED ORDER — CARVEDILOL 12.5 MG PO TABS
25.0000 mg | ORAL_TABLET | Freq: Two times a day (BID) | ORAL | Status: DC
Start: 2015-09-30 — End: 2015-10-01
  Administered 2015-09-30 – 2015-10-01 (×2): 25 mg via ORAL
  Filled 2015-09-30 (×2): qty 2

## 2015-09-30 MED ORDER — SODIUM CHLORIDE 0.9 % IV BOLUS
500.0000 mL | Freq: Once | INTRAVENOUS | Status: AC
Start: 2015-09-30 — End: 2015-09-30
  Administered 2015-09-30: 500 mL via INTRAVENOUS

## 2015-09-30 MED ORDER — GLUCAGON 1 MG IJ SOLR (WRAP)
1.0000 mg | INTRAMUSCULAR | Status: DC | PRN
Start: 2015-09-30 — End: 2015-10-01

## 2015-09-30 NOTE — H&P (Signed)
[facility]  H&P    Patient Info:   Date Time: 09/30/2015  9:02 PM   Patient Name:Jacqueline Weber   JU:[id number]    PCP: Juanda Bond, MD   Admit Date:09/30/2015   Attending Physician:Rodolfo Notaro, Leo Grosser, MD      Assessment and Plan:   1.  Atypical chest pain most likely secondary to reflux.  However given patient's past medical history of diabetes, hypertension and hyperlipidemia, will rule out acute MI.  Initial troponin is negative at less than 0.01.  EKG shows normal sinus rhythm, rate 76, no acute ST changes noted.  Echocardiogram from September 2016 shows ejection fraction 60 percent grade 1 diastolic dysfunction.    We will consult [facility], appreciate recommendations   Monitor and trend troponins.   Will obtain lipid panel.   Will obtain thyroid-stimulating hormone.   We will obtain hemoglobin A1c   Continue Coreg 25 mg by mouth twice a day   Aspirin 81 mg by mouth daily    2. Shortness of breath, patient reports shortness of breath at baseline, however, reports new onset dyspnea with exertion ongoing for approximately 2 weeks.  Patient's d-dimer is elevated at 1.60, while score is less than 2.   X-ray of chest shows lungs are clear with normal pulmonary vascularity, no acute findings.  Given patient's elevated d-dimer and renal insufficiency.  We will obtain VQ scan   Will obtain PET scan   Bilateral lower extremity Dopplers   DuoNeb every 4 hours as needed for shortness of breath.    3. Acute kidney injury, creatinine on admission is 1.1, baseline appears to be between 0.8 and 1.0.  Given patient's acute kidney injury will hold hydrochlorothiazide, losartan, metformin for now    Most likely secondary to dehydration.    Monitor BMP    4. Hyponatremia, sodium on admission is 125, most likely secondary to hydrochlorothiazide.     IV hydration with normal saline at 100 mL an hour   BMP   Urine analysis is pending    5. Microcytic anemia, most likely secondary to iron  deficiency.  Patient's H and H is 8.6 and 26.9, patient denies dark tarry stools, emesis.     Will obtain iron panel   Monitor for signs and symptoms of bleeding   Monitor H and H.    6.  History of hypertension, blood pressure appears well controlled at this time 147/73.  Continue Coreg 25 mg by mouth.  Given patient's acute kidney injury will hold hydrochlorothiazide, losartan    Monitor blood pressures     7.  History of type 2 diabetes, blood sugar on admission 150.  Continue glimepiride   We will start insulin sliding scale before meals at bedtime   Monitor blood sugars.   Will check hemoglobin A1c    8.  History of CVA.  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  Patient continues to have left-sided weakness at baseline    Hospital Problems:  Active Problems:    Type 2 diabetes mellitus without complication    HTN (hypertension) urgency    Hyperlipidemia    History of CVA (cerebrovascular accident)    Chest pain    SOB (shortness of breath)    Nausea    Elevated d-dimer     DVT Prohylaxis:SEDs    Code Status: Prior   Disposition: Home    Prognosis: Stable    Type of Admission: Observation    Estimated Length of  Stay (including stay in the ER receiving treatment): Less than 2 midnights    Medical Necessity for stay: Chest pain, shortness of breath, hyponatremia        Clinical Presentation   History of Presenting Illness:   Jacqueline Weber is a 65 y.o. female who has history of History reviewed. No pertinent past surgical history.   Past Medical History   Diagnosis Date   . Diabetes mellitus    . Hypertension    . Hypercholesteremia    . CVA (cerebral infarction) 03/2013   . Cataracts, bilateral    . Cerebrovascular accident      This 65 year old female presents to the ED with chest pain, shortness of breath ongoing for 2 weeks.  Patient's daughter is at bedside, patient does not speak [place], wishes daughter to be Optometrist.  Patient reports chest pain and shortness of breath  started approximately 2 weeks ago.  Chest pain is located in the mid sternum is described as tightness, occurs intermittently, lasting for approximately 2 hours in duration,  pain is worse at night with laying down.  Patient reports shortness of breath at baseline, however, has been experiencing dyspnea on exertion.  Patient denies recent fever, cough or chills.  Patient denies any alleviating factors, aggravating factors.  Patient also reports associated nausea for the past 2 weeks, denies vomiting.  Patient has significant past medical history for CVA, in 2014 , as well as diabetes, hypertension, hyperlipidemia.  Patient reports at baseline she has left-sided weakness secondary to residual from stroke.  Patient was recently seen in the ED for hypertension.  Patient denies numbness, tingling, focal weakness, vomiting, diarrhea, constipation, frequency, urgency, hematuria, melena, hematochezia, fever, chills, recent illness or travel    Review of Systems:  Review of Systems   Constitutional: Positive for malaise/fatigue. Negative for fever, chills, weight loss and diaphoresis.   HENT: Negative for congestion and sore throat.    Eyes: Negative for blurred vision, double vision and photophobia.   Respiratory: Positive for shortness of breath. Negative for cough, hemoptysis, sputum production and wheezing.    Cardiovascular: Positive for chest pain. Negative for palpitations, orthopnea, claudication, leg swelling and PND.   Gastrointestinal: Positive for heartburn and nausea. Negative for vomiting, abdominal pain, diarrhea, constipation, blood in stool and melena.   Genitourinary: Negative for dysuria, urgency, frequency, hematuria and flank pain.   Musculoskeletal: Negative for myalgias, joint pain, falls and neck pain.   Skin: Negative for itching and rash.   Neurological: Negative for dizziness, tingling, tremors, sensory change, speech change, focal weakness, seizures, loss of consciousness, weakness and headaches.    Psychiatric/Behavioral: Negative for depression.          Vitals:   Vitals reviewed height is 1.524 m (5') and weight is 72.576 kg (160 lb). Her temperature is 97.6 F (36.4 C). Her blood pressure is 147/73 and her pulse is 73. Her respiration is 18 and oxygen saturation is 100%. Body mass index is 31.25 kg/(m^2).  Filed Vitals:    09/30/15 1624 09/30/15 1808   BP: 204/137 147/73   Pulse: 77 73   Temp: 97.6 F (36.4 C)    Resp: 12 18   Height: 1.524 m (5')    Weight: 72.576 kg (160 lb)    SpO2: 99% 100%        Physical Exam:   Physical Exam   Constitutional: She is oriented to person, place, and time. She appears well-developed and well-nourished. No distress.   HENT:   Head:  Normocephalic.   Right Ear: External ear normal.   Left Ear: External ear normal.   Nose: Nose normal.   Mouth/Throat: No oropharyngeal exudate.   Eyes: Conjunctivae and EOM are normal. Pupils are equal, round, and reactive to light. Right eye exhibits no discharge. Left eye exhibits no discharge. No scleral icterus.   Neck: Normal range of motion. Neck supple. No JVD present. No tracheal deviation present.   Cardiovascular: Normal rate, regular rhythm, normal heart sounds and intact distal pulses.  Exam reveals no gallop and no friction rub.    No murmur heard.  Pulmonary/Chest: Effort normal and breath sounds normal. No stridor. No respiratory distress. She has no wheezes. She has no rales. She exhibits no tenderness.   Abdominal: Soft. Bowel sounds are normal. She exhibits no distension. There is no tenderness. There is no guarding.   Musculoskeletal: Normal range of motion. She exhibits no edema or tenderness.   Lymphadenopathy:     She has no cervical adenopathy.   Neurological: She is alert and oriented to person, place, and time. No cranial nerve deficit. She exhibits normal muscle tone. Coordination normal.   Skin: Skin is warm and dry. No rash noted. She is not diaphoretic. No erythema. No pallor.   Psychiatric: She has a normal  mood and affect. Her behavior is normal. Judgment and thought content normal.                    Clinical Information   Chief Complaint:  Chief Complaint   Patient presents with   . Chest Pain      Past Medical History:  Past Medical History   Diagnosis Date   . Diabetes mellitus    . Hypertension    . Hypercholesteremia    . CVA (cerebral infarction) 03/2013   . Cataracts, bilateral    . Cerebrovascular accident       Past Surgical History:History reviewed. No pertinent past surgical history.   Family History:  Family History   Problem Relation Age of Onset   . No known problems Mother    . No known problems Father       Social History:  History   Alcohol Use No     History   Drug Use No     History   Smoking status   . Never Smoker    Smokeless tobacco   . Not on file     Social History     Social History   . Marital Status: Widowed     Spouse Name: N/A   . Number of Children: N/A   . Years of Education: N/A     Social History Main Topics   . Smoking status: Never Smoker    . Smokeless tobacco: None   . Alcohol Use: No   . Drug Use: No   . Sexual Activity: Not Asked     Other Topics Concern   . None     Social History Narrative      Allergies:  Allergies   Allergen Reactions   . Lisinopril    . Penicillins       Medications:  (Not in a hospital admission)  Current Facility-Administered Medications   Medication Dose Route Frequency Last Rate Last Dose   . 0.9%  NaCl infusion   Intravenous Continuous              Results of Labs/imaging   Labs have been reviewed:   Coagulation Profile:  CBC review:     Recent Labs  Lab 09/30/15  1721   WBC 5.42   HGB 8.6*   HEMATOCRIT 26.9*   PLATELETS 242   MCV 65.0*   RDW 14   NEUTROPHILS 60   LYMPHOCYTES AUTOMATED 23   EOSINOPHILS AUTOMATED 4   IMMATURE GRANULOCYTE 0   NEUTROPHILS ABSOLUTE 3.27   ABSOLUTE IMMATURE GRANULOCYTE 0.01      Chem Review:    Recent Labs  Lab 09/30/15  1721   SODIUM 125*   POTASSIUM 5.2*   CHLORIDE 93*   CO2 20*   BUN 24.5*   CREATININE 1.1*    GLUCOSE 150*   CALCIUM 9.7   MAGNESIUM 2.1   BILIRUBIN, TOTAL 0.2   AST (SGOT) 19   ALT 10   ALKALINE PHOSPHATASE 65      Results     Procedure Component Value Units Date/Time    Comprehensive metabolic panel [id number]  (Abnormal) Collected:  09/30/15 1721    Specimen Information:  Blood Updated:  09/30/15 1808     Glucose 150 (H) mg/dL      BUN 24.5 (H) mg/dL      Creatinine 1.1 (H) mg/dL      Sodium 125 (L) mEq/L      Potassium 5.2 (H) mEq/L      Chloride 93 (L) mEq/L      CO2 20 (L) mEq/L      Calcium 9.7 mg/dL      Protein, Total 6.7 g/dL      Albumin 3.6 g/dL      AST (SGOT) 19 U/L      ALT 10 U/L      Alkaline Phosphatase 65 U/L      Bilirubin, Total 0.2 mg/dL      Globulin 3.1 g/dL      Albumin/Globulin Ratio 1.2      Anion Gap 12.0     Magnesium [id number] Collected:  09/30/15 1721    Specimen Information:  Blood Updated:  09/30/15 1808     Magnesium 2.1 mg/dL     GFR [id number] Collected:  09/30/15 1721     EGFR 50.2 Updated:  09/30/15 1808    Troponin I [id number] Collected:  09/30/15 1721    Specimen Information:  Blood Updated:  09/30/15 1807     Troponin I <0.01 ng/mL     Cell MorpHology [id number]  (Abnormal) Collected:  09/30/15 1721     Cell Morphology: Abnormal (A) Updated:  09/30/15 1806     Anisocytosis =1+ (A)      Microcytic =2+ (A)      Hypochromia =1+ (A)      Platelet Estimate Normal     D-Dimer [id number]  (Abnormal) Collected:  09/30/15 1721     D-Dimer 1.60 (H) ug/mL FEU Updated:  09/30/15 1806    CBC with differential [id number]  (Abnormal) Collected:  09/30/15 1721    Specimen Information:  Blood from Blood Updated:  09/30/15 1805     WBC 5.42 x10 3/uL      Hgb 8.6 (L) g/dL      Hematocrit 26.9 (L) %      Platelets 242 x10 3/uL      RBC 4.14 (L) x10 6/uL      MCV 65.0 (L) fL      MCH 20.8 (L) pg      MCHC 32.0 g/dL      RDW 14 %      MPV 10.3 fL  Neutrophils 60 %      Lymphocytes Automated 23 %      Monocytes 12 %      Eosinophils Automated 4 %      Basophils Automated 0 %       Immature Granulocyte 0 %      Neutrophils Absolute 3.27 x10 3/uL      Abs Lymph Automated 1.27 x10 3/uL      Abs Mono Automated 0.66 x10 3/uL      Abs Eos Automated 0.20 x10 3/uL      Absolute Baso Automated 0.02 x10 3/uL      Absolute Immature Granulocyte 0.01 x10 3/uL          Radiology reports have been reviewed:  Radiology Results (24 Hour)     Procedure Component Value Units Date/Time    XR Chest  AP Portable [id number] Collected:  09/30/15 1907    Order Status:  Completed Updated:  09/30/15 1912    Narrative:      HISTORY: Chest pain    COMPARISON: 09/21/2025    FINDINGS: Portable AP view performed. No significant change. The lungs  are clear with normal pulmonary vascularity. No pleural abnormality.  Heart, mediastinum, and hila are normal. Trachea is midline. Osseous  structures are unremarkable.          Impression:       No acute findings.    Susy [place], MD   09/30/2015 7:08 PM           EKG: EKG reviewed , normal sinus rhythm, rate 76, no acute ST changes noted    Procedures:       Hospitalist Physician   Signed by: Lucilla Lame Frohm   09/30/2015 9:02 PM         [facility]  Attestation Note    Patient Info:   Date/Time: 10/01/2015 / 8:51 AM   Patient Name:Jacqueline Weber   OE:[id number]    PCP: Juanda Bond, MD   Admit Date:09/30/2015   Attending Physician:Asal Teas, Leo Grosser, MD      Assessment and Plan:   I have directly reviewed the clinical findings, labs, imaging studies and management of this patient in detail. I have interviewed and examined the patient and agree with the documentation, as recorded by Dalbert Garnet NP.  1.  Atypical chest pain, shortness of breath, most likely from acid reflux; however, given the patient's extensive history of CVA and transient ischemic attack in the past.  Need to rule out acute coronary syndrome and PE in this patient.  Patient's d-dimer was elevated.  However, her creatinine is slightly elevated.  We will obtain VQ scan.  We will rule out  acute coronary syndrome by obtaining serial cardiac enzymes and repeat EKG.  We have consulted [facility].  We will await further input      2.  Acute kidney injury; Will [place] patient's had a pro-thiazide and losartan along with metformin for the time being.      3.  Hyponatremia; patient's sodium is at 125.  Most like secondary to hydrochlorothiazide.  We will discontinue hydrochlorothiazide.  Will start patient on IV fluids with normal saline at 100 mL per hour.  Recheck BMP in the morning    Type of Admission:Inpatient   Code Status: Full Code     Subjective:   Chief Complaint:  Chief Complaint   Patient presents with   . Chest Pain      Review of Systems: Chest pain and  shortness of breath    Medications:Meds have been reviewed    Allergies:@ALLERGY        Objective:   Vitals: Vitals reviewed height is 1.524 m (5') and weight is 72.893 kg (160 lb 11.2 oz). Her temporal artery temperature is 97 F (36.1 C). Her blood pressure is 168/81 and her pulse is 67. Her respiration is 18 and oxygen saturation is 97%. Body mass index is 31.38 kg/(m^2).  Filed Vitals:    09/30/15 1808 09/30/15 2116 10/01/15 0144 10/01/15 0601   BP: 147/73 204/95 160/76 168/81   Pulse: 73 81 68 67   Temp:  97.1 F (36.2 C) 97.5 F (36.4 C) 97 F (36.1 C)   TempSrc:  Temporal Artery Temporal Artery Temporal Artery   Resp: 18 18 16 18    Height:  1.524 m (5')     Weight:  72.712 kg (160 lb 4.8 oz)  72.893 kg (160 lb 11.2 oz)   SpO2: 100% 97% 97% 97%   Intake and Output Summary (Last 24 hours) at Date Time No intake or output data in the 24 hours ending 10/01/15 0851   Physical Exam:   Cardiac exam; S1, S2 heard, regular rate and rhythm, no JVD,  Chest; decreased air movement, no rhonchi or wheezing heard.         Results of Labs/imaging   Labs, Radiology reports have been reviewed:   Coagulation Profile:            Hospitalist    Signed by: Dahlia Byes   10/01/2015 8:51 AM

## 2015-09-30 NOTE — ED Provider Notes (Addendum)
Physician/Midlevel provider first contact with patient: 09/30/15 1853         History     Chief Complaint   Patient presents with   . Chest Pain     HPI     Patient Name: Jacqueline Weber, Jacqueline Weber, 65 y.o., female      DR. Xian Alves  is the primary attending for this patient and has obtained and performed the history, PE, and medical decision making for this patient.    History obtained by: patient/family  Chief Complaint/Onset/Duration/Quality/Location/Severity/Context: Patient presents with weakness and dizziness 1 or 2 weeks that is worsened today.  Today she also describes chest discomfort in her mid chest starting around 10 AM.  Aggravating Factors/Alleviating Factors: Aggravated by nothing. Alleviated by nothing.  Associated Symptoms: no sob/syncope/abd pain/focal deficits.            *This note was generated by the Epic EMR system/ Dragon speech recognition and may contain inherent errors or omissions not intended by the user. Grammatical errors, random word insertions, deletions, pronoun errors and incomplete sentences are occasional consequences of this technology due to software limitations. Not all errors are caught or corrected. If there are questions or concerns about the content of this note or information contained within the body of this dictation they should be addressed directly with the author for clarification          Past Medical History   Diagnosis Date   . Diabetes mellitus    . Hypertension    . Hypercholesteremia    . CVA (cerebral infarction) 03/2013   . Cataracts, bilateral        History reviewed. No pertinent past surgical history.    History reviewed. No pertinent family history.    Social  Social History   Substance Use Topics   . Smoking status: Never Smoker    . Smokeless tobacco: None   . Alcohol Use: No       .     Allergies   Allergen Reactions   . Lisinopril    . Penicillins        Home Medications             aspirin EC 325 MG EC tablet     Take 1 tablet (325 mg total) by mouth  daily.     calcium citrate (CALCITRATE) 950 MG tablet     Take 1 tablet by mouth daily.     carvedilol (COREG) 25 MG tablet     Take 25 mg by mouth.     fluticasone (FLONASE) 50 MCG/ACT nasal spray     1 spray by Nasal route daily.     glimepiride (AMARYL) 4 MG tablet     Take 1 tablet (4 mg total) by mouth every morning with breakfast.     Patient taking differently:  Take 2 mg by mouth every morning with breakfast.        hydroCHLOROthiazide (HYDRODIURIL) 25 MG tablet     Take 25 mg by mouth daily.     losartan (COZAAR) 50 MG tablet     Take 1 tablet (50 mg total) by mouth 2 (two) times daily.     Patient taking differently:  Take 100 mg by mouth 2 (two) times daily.        metFORMIN (GLUCOPHAGE) 1000 MG tablet     Take 1 tablet (1,000 mg total) by mouth 2 (two) times daily with meals.     simvastatin (ZOCOR) 10 MG tablet  Take 10 mg by mouth nightly.           Review of Systems   Constitutional: Negative for fever and chills.   HENT: Negative for sore throat.    Respiratory: Negative for cough and shortness of breath.    Cardiovascular: Positive for chest pain. Negative for palpitations.   Gastrointestinal: Negative for nausea, vomiting and abdominal pain.   Musculoskeletal: Negative for back pain and neck pain.   Skin: Negative for pallor and rash.   Neurological: Positive for weakness. Negative for syncope and headaches.   All other systems reviewed and are negative.      Physical Exam    BP: (!) 204/137 mmHg, Heart Rate: 77, Temp: 97.6 F (36.4 C), Resp Rate: 12, SpO2: 99 %, Weight: 72.576 kg    Physical Exam   Constitutional: She is oriented to person, place, and time. She appears distressed.   HENT:   Head: Normocephalic and atraumatic.   Mouth/Throat: Oropharynx is clear and moist.   Eyes: Conjunctivae and EOM are normal.   Neck: Normal range of motion. Neck supple.   Cardiovascular: Normal rate, regular rhythm, normal heart sounds and intact distal pulses.    Pulmonary/Chest: Effort normal and breath  sounds normal. No respiratory distress.   Abdominal: Soft. Bowel sounds are normal. She exhibits no distension. There is no tenderness. There is no CVA tenderness.   Musculoskeletal: Normal range of motion. She exhibits no edema.   Neurological: She is alert and oriented to person, place, and time. She has normal strength. No cranial nerve deficit. Coordination normal.   Skin: Skin is warm and dry. No rash noted.   Nursing note and vitals reviewed.        MDM and ED Course     ED Medication Orders     Start Ordered     Status Ordering Provider    09/30/15 1922 09/30/15 1921  sodium chloride 0.9 % bolus 500 mL   Once     Route: Intravenous  Ordered Dose: 500 mL     Last MAR action:  New Bag Regan Rakers, [facility]    09/30/15 1922 09/30/15 1921  0.9%  NaCl infusion   Continuous     Route: Intravenous     Acknowledged Cleotis Nipper    09/30/15 1922 09/30/15 1921  aspirin chewable tablet 324 mg   Once     Route: Oral  Ordered Dose: 324 mg     Last MAR action:  Given Tove Wideman             MDM  Number of Diagnoses or Management Options  Anemia, unspecified type:   Hyperkalemia:   Hyponatremia syndrome:   Precordial pain:   Diagnosis management comments: DR. Cleotis Nipper  is the primary attending for this patient and has obtained and performed the history, PE, and medical decision making for this patient.        differential diagnosis to include but not limited to : electrolyte imbalance, infection, cad.         Amount and/or Complexity of Data Reviewed  Clinical lab tests: reviewed and ordered  Tests in the radiology section of CPT: ordered and reviewed    Risk of Complications, Morbidity, and/or Mortality  Presenting problems: high  Diagnostic procedures: high  Management options: high        Doubt pe - ddimer ordered in triage prior to seeing pt due to long wait in  Triage. Doubt dvt/pe. Admit for iv fluids/repeat enzymes.  I spoke to admitting physician regarding admission. Pt presentation, course and results  relayed to admitting doctor.    Results     Procedure Component Value Units Date/Time    Comprehensive metabolic panel [id number]  (Abnormal) Collected:  09/30/15 1721    Specimen Information:  Blood Updated:  09/30/15 1808     Glucose 150 (H) mg/dL      BUN 24.5 (H) mg/dL      Creatinine 1.1 (H) mg/dL      Sodium 125 (L) mEq/L      Potassium 5.2 (H) mEq/L      Chloride 93 (L) mEq/L      CO2 20 (L) mEq/L      Calcium 9.7 mg/dL      Protein, Total 6.7 g/dL      Albumin 3.6 g/dL      AST (SGOT) 19 U/L      ALT 10 U/L      Alkaline Phosphatase 65 U/L      Bilirubin, Total 0.2 mg/dL      Globulin 3.1 g/dL      Albumin/Globulin Ratio 1.2      Anion Gap 12.0     Magnesium [id number] Collected:  09/30/15 1721    Specimen Information:  Blood Updated:  09/30/15 1808     Magnesium 2.1 mg/dL     GFR [id number] Collected:  09/30/15 1721     EGFR 50.2 Updated:  09/30/15 1808    Troponin I [id number] Collected:  09/30/15 1721    Specimen Information:  Blood Updated:  09/30/15 1807     Troponin I <0.01 ng/mL     Cell MorpHology [id number]  (Abnormal) Collected:  09/30/15 1721     Cell Morphology: Abnormal (A) Updated:  09/30/15 1806     Anisocytosis =1+ (A)      Microcytic =2+ (A)      Hypochromia =1+ (A)      Platelet Estimate Normal     D-Dimer [id number]  (Abnormal) Collected:  09/30/15 1721     D-Dimer 1.60 (H) ug/mL FEU Updated:  09/30/15 1806    CBC with differential [id number]  (Abnormal) Collected:  09/30/15 1721    Specimen Information:  Blood from Blood Updated:  09/30/15 1805     WBC 5.42 x10 3/uL      Hgb 8.6 (L) g/dL      Hematocrit 26.9 (L) %      Platelets 242 x10 3/uL      RBC 4.14 (L) x10 6/uL      MCV 65.0 (L) fL      MCH 20.8 (L) pg      MCHC 32.0 g/dL      RDW 14 %      MPV 10.3 fL      Neutrophils 60 %      Lymphocytes Automated 23 %      Monocytes 12 %      Eosinophils Automated 4 %      Basophils Automated 0 %      Immature Granulocyte 0 %      Neutrophils Absolute 3.27 x10 3/uL      Abs Lymph  Automated 1.27 x10 3/uL      Abs Mono Automated 0.66 x10 3/uL      Abs Eos Automated 0.20 x10 3/uL      Absolute Baso Automated 0.02 x10 3/uL      Absolute Immature Granulocyte 0.01 x10 3/uL           Radiology  Results (24 Hour)     Procedure Component Value Units Date/Time    XR Chest  AP Portable [id number] Collected:  09/30/15 1907    Order Status:  Completed Updated:  09/30/15 1912    Narrative:      HISTORY: Chest pain    COMPARISON: 09/21/2025    FINDINGS: Portable AP view performed. No significant change. The lungs  are clear with normal pulmonary vascularity. No pleural abnormality.  Heart, mediastinum, and hila are normal. Trachea is midline. Osseous  structures are unremarkable.          Impression:       No acute findings.    Susy Manor, MD   09/30/2015 7:08 PM                EKG Interpretation  EKG interpreted independently by ED physician  Rate: Normal for age.  Rhythm: Normal sinus rhythm  Axis: Normal for age  PR, QRS and QT intervals:  normal for age and rate  ST Segments: No deviations suggestive of ischemia  Impression: Normal ECG with no evidence of ischemia.    Attending : Dr. Cleotis Nipper            Procedures    Clinical Impression & Disposition     Clinical Impression  Final diagnoses:   Hyponatremia syndrome   Precordial pain   Hyperkalemia   Anemia, unspecified type        ED Disposition     Admit Bed Type: Telemetry [5]  Admitting Physician: Dahlia Byes [id number]  Patient Class: Inpatient [101]             New Prescriptions    No medications on file                   Cleotis Nipper, MD  09/30/15 Coral Spikes, MD  10/14/15 [phone]

## 2015-09-30 NOTE — ED Notes (Addendum)
Chest pain and SOB x 1 week  Seen at [facility] last week for same and noted have HTN, Carvedilol 25 mg bid started and family states pt has been more weak/ dizzy and now CP

## 2015-09-30 NOTE — Plan of Care (Signed)
Problem: Health Promotion  Goal: Knowledge - disease process  Extent of understanding conveyed about a specific disease process.   Outcome: Progressing  .    Problem: Safety  Goal: Patient will be free from injury during hospitalization  Outcome: Progressing  Patient in bed her daughter is at the bedside. Bed is in locked lowered position, call bell , phone and side table are within reach. Bed alarm activated. Rounding done hourly.  Intervention: Assess patient's risk for falls and implement fall prevention plan of care per policy  .  Intervention: Provide and maintain safe environment  .  Intervention: Use appropriate transfer methods  .  Intervention: Include patient/family/caregiver in decisions related to safety  .  Intervention: Hourly rounding.  .  Intervention: Assess for patient's risk for elopement and implement Elopement Risk plan per policy  .      Problem: Pain  Goal: Patient's pain/discomfort is manageable  Outcome: Progressing  Intervention: Include patient/family/caregiver in decisions related to pain management  .  Intervention: Offer non-pharmocological pain management interventions  .

## 2015-10-01 ENCOUNTER — Inpatient Hospital Stay: Payer: Charity

## 2015-10-01 DIAGNOSIS — I158 Other secondary hypertension: Secondary | ICD-10-CM

## 2015-10-01 DIAGNOSIS — R110 Nausea: Secondary | ICD-10-CM

## 2015-10-01 DIAGNOSIS — E784 Other hyperlipidemia: Secondary | ICD-10-CM

## 2015-10-01 LAB — ECG 12-LEAD
Atrial Rate: 76 {beats}/min
Atrial Rate: 66 {beats}/min
P Axis: -4 degrees
P Axis: 11 degrees
P-R Interval: 166 ms
P-R Interval: 182 ms
Q-T Interval: 356 ms
Q-T Interval: 392 ms
QRS Duration: 82 ms
QRS Duration: 80 ms
QTC Calculation (Bezet): 400 ms
QTC Calculation (Bezet): 410 ms
R Axis: 19 degrees
R Axis: 24 degrees
T Axis: 9 degrees
T Axis: 9 degrees
Ventricular Rate: 76 {beats}/min
Ventricular Rate: 66 {beats}/min

## 2015-10-01 LAB — CBC AND DIFFERENTIAL
Basophils Absolute Automated: 0.03 10*3/uL (ref 0.00–0.20)
Basophils Automated: 0 %
Eosinophils Absolute Automated: 0.18 10*3/uL (ref 0.00–0.70)
Eosinophils Automated: 3 %
Hematocrit: 24.2 % — ABNORMAL LOW (ref 37.0–47.0)
Hgb: 7.9 g/dL — ABNORMAL LOW (ref 12.0–16.0)
Lymphocytes Absolute Automated: 2.17 10*3/uL (ref 0.50–4.40)
Lymphocytes Automated: 34 %
MCH: 20.7 pg — ABNORMAL LOW (ref 28.0–32.0)
MCHC: 32.6 g/dL (ref 32.0–36.0)
MCV: 63.4 fL — ABNORMAL LOW (ref 80.0–100.0)
MPV: 10.6 fL (ref 9.4–12.3)
Monocytes Absolute Automated: 0.63 10*3/uL (ref 0.00–1.20)
Monocytes: 10 %
Neutrophils Absolute: 3.33 10*3/uL (ref 1.80–8.10)
Neutrophils: 53 %
Platelets: 244 10*3/uL (ref 140–400)
RBC: 3.82 10*6/uL — ABNORMAL LOW (ref 4.20–5.40)
RDW: 14 % (ref 12–15)
WBC: 6.34 10*3/uL (ref 3.50–10.80)

## 2015-10-01 LAB — BASIC METABOLIC PANEL
Anion Gap: 9 (ref 5.0–15.0)
Anion Gap: 9 (ref 5.0–15.0)
BUN: 19.9 mg/dL — ABNORMAL HIGH (ref 7.0–19.0)
BUN: 20.2 mg/dL — ABNORMAL HIGH (ref 7.0–19.0)
CO2: 20 mEq/L — ABNORMAL LOW (ref 22–29)
CO2: 20 mEq/L — ABNORMAL LOW (ref 22–29)
Calcium: 9 mg/dL (ref 8.5–10.5)
Calcium: 9 mg/dL (ref 8.5–10.5)
Chloride: 99 mEq/L — ABNORMAL LOW (ref 100–111)
Chloride: 100 mEq/L (ref 100–111)
Creatinine: 0.9 mg/dL (ref 0.6–1.0)
Creatinine: 1 mg/dL (ref 0.6–1.0)
Glucose: 133 mg/dL — ABNORMAL HIGH (ref 70–100)
Glucose: 203 mg/dL — ABNORMAL HIGH (ref 70–100)
Potassium: 4.4 mEq/L (ref 3.5–5.1)
Potassium: 5.1 mEq/L (ref 3.5–5.1)
Sodium: 128 mEq/L — ABNORMAL LOW (ref 136–145)
Sodium: 129 mEq/L — ABNORMAL LOW (ref 136–145)

## 2015-10-01 LAB — HEMOGLOBIN A1C
Average Estimated Glucose: 134.1 mg/dL
Hemoglobin A1C: 6.3 % — ABNORMAL HIGH (ref 4.6–5.9)

## 2015-10-01 LAB — LIPID PANEL
Cholesterol / HDL Ratio: 3
Cholesterol: 148 mg/dL (ref 0–199)
HDL: 49 mg/dL (ref 40–9999)
LDL Calculated: 84 mg/dL (ref 0–99)
Triglycerides: 74 mg/dL (ref 34–149)
VLDL Calculated: 15 mg/dL (ref 10–40)

## 2015-10-01 LAB — CELL MORPHOLOGY
Cell Morphology: ABNORMAL — AB
Platelet Estimate: NORMAL

## 2015-10-01 LAB — GLUCOSE WHOLE BLOOD - POCT: Whole Blood Glucose POCT: 165 mg/dL — ABNORMAL HIGH (ref 70–100)

## 2015-10-01 LAB — IRON PROFILE
Iron Saturation: 18 % (ref 15–50)
Iron: 48 ug/dL (ref 40–145)
TIBC: 272 ug/dL (ref 265–497)
UIBC: 224 ug/dL (ref 126–382)

## 2015-10-01 LAB — TROPONIN I: Troponin I: 0.02 ng/mL (ref 0.00–0.09)

## 2015-10-01 LAB — TSH: TSH: 1.53 u[IU]/mL (ref 0.35–4.94)

## 2015-10-01 LAB — GFR
EGFR: >60
EGFR: 56

## 2015-10-01 LAB — HEMOLYSIS INDEX: Hemolysis Index: 35 — ABNORMAL HIGH (ref 0–18)

## 2015-10-01 MED ORDER — MECLIZINE HCL 25 MG PO TABS
25.0000 mg | ORAL_TABLET | Freq: Three times a day (TID) | ORAL | Status: DC
Start: 2015-10-01 — End: 2016-05-16

## 2015-10-01 MED ORDER — AMLODIPINE BESYLATE 5 MG PO TABS
5.0000 mg | ORAL_TABLET | Freq: Every day | ORAL | Status: DC
Start: 2015-10-01 — End: 2016-05-19

## 2015-10-01 MED ORDER — TECHNETIUM TC 99M PENTETATE AEROSOL
68.0000 | Freq: Once | Status: AC | PRN
Start: 2015-10-01 — End: 2015-10-01
  Administered 2015-10-01: 68 via RESPIRATORY_TRACT
  Filled 2015-10-01: qty 75

## 2015-10-01 MED ORDER — FAMOTIDINE 20 MG PO TABS
20.0000 mg | ORAL_TABLET | Freq: Every day | ORAL | Status: DC
Start: 2015-10-01 — End: 2016-06-21

## 2015-10-01 MED ORDER — TECHNETIUM TC 99M ALBUMIN AGGREGATED
6.6000 | Freq: Once | Status: AC | PRN
Start: 2015-10-01 — End: 2015-10-01
  Administered 2015-10-01: 7 via INTRAVENOUS
  Filled 2015-10-01: qty 10

## 2015-10-01 NOTE — Plan of Care (Signed)
Patient cleared for d/c.

## 2015-10-01 NOTE — Discharge Summary (Signed)
[facility] HOSPITALIST   [facility] Summary     Patient Info:   Date Time: 10/01/2015  4:42 PM   Patient Name:Jacqueline Weber   JU:[id number]    PCP: Juanda Bond, MD   Admit Date:09/30/2015   Attending Physician:No att. providers found      Hospital Course:   Brief history ad admission and Hospital course:  Patient 65 year old female came in with atypical chest pain.  Seen by cardiologist recommended outpatient follow-up to have echocardiogram done in stress test done.  Cardiac enzymes are negative. EKG did not show any acute changes. Cardiologist recommended amlodipine for her blood pressure.  Shortness of breath, most probably related to uncontrolled hypertension, hypertensive crisis, improved with controlling blood pressure.  History of CVA, stable at this point.  Diabetes mellitus type 2, on appropriate medication.  Continue for now.  Patient had hyponatremia.  Sodium was 125.  Hydrochlorothiazide was on hold.  Sodium improved to 129 had a long discussion with the patient and her daughter to keep holding hydrochlorothiazide.  We will add amlodipine.  Follow-up on chem 8 in 1 week with PCP.  Patient apparently been drinking a lot of water for dryness of mouth.  Had a discussion with her about fluid intake and balancing her diet and fluid.  Discharge diagnosis:  Please see H&P for complete details of HPI and ROS. The patient was admitted to [facility] and has been diagnosed with the following conditions and has been taken care as mentioned below.      --Atypical chest pain most likely secondary to reflux.     --Shortness of breath, patient reports shortness of breath at baseline, however, reports new onset dyspnea with exertion ongoing for approximately 2 weeks. Patient's d-dimer is elevated at 1.60, while score is less than 2. X-ray of chest shows lungs are clear with normal pulmonary vascularity, no acute findings. Given patient's elevated d-dimer and renal insufficiency.  VQ scan was done,  no friability.  A little lower extremity ultrasound negative.  Further plan by cardiologist outpatient echocardiogram and stress test.  Her shortness of breath improved after controlling her blood pressure.        -- Acute kidney injury, ruled out.  It was dehydration improved with IV hydration.    --Hyponatremia, sodium on admission is 125, improved 129 after IV hydration and holding hydrochlorothiazide    -- Microcytic anemia, most likely secondary to iron deficiency.    --History of hypertension, with hypertensive crisis, improved adding amlodipine    --History of type 2 diabetes, blood sugar on admission 150.    --History of CVA. 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   Hospital Problems:  Active Problems:    Type 2 diabetes mellitus without complication    HTN (hypertension) urgency    Hyperlipidemia    History of CVA (cerebrovascular accident)    Chest pain    SOB (shortness of breath)    Nausea    Elevated d-dimer     Admission Date:09/30/2015   Discharge Date:10/01/2015    Disposition: home   Condition at Discharge and Prognosis: stable   Type of Admission: Observation   Medical Necessity for stay:as above   Code Status: Full Code       Clinical Presentation:      Chief Complaint:   Chief Complaint   Patient presents with   . Chest Pain      Vitals: Vitals reviewed height is 1.524 m (5') and weight is 72.893  kg (160 lb 11.2 oz). Her temporal artery temperature is 97.4 F (36.3 C). Her blood pressure is 160/74 and her pulse is 76. Her respiration is 16 and oxygen saturation is 97%. Body mass index is 31.38 kg/(m^2).  Filed Vitals:    09/30/15 2116 10/01/15 0144 10/01/15 0601 10/01/15 0942   BP: 204/95 160/76 168/81 160/74   Pulse: 81 68 67 76   Temp: 97.1 F (36.2 C) 97.5 F (36.4 C) 97 F (36.1 C) 97.4 F (36.3 C)   TempSrc: Temporal Artery Temporal Artery Temporal Artery Temporal Artery   Resp: 18 16 18 16    Height: 1.524 m (5')      Weight: 72.712 kg (160 lb 4.8 oz)   72.893 kg (160 lb 11.2 oz)    SpO2: 97% 97% 97% 97%     Intake and Output Summary (Last 24 hours) at Date Time No intake or output data in the 24 hours ending 10/01/15 1642   Physical Exam:  Physical Exam   Cardiovascular: Exam reveals no friction rub.    No murmur heard.  Pulmonary/Chest: No respiratory distress. She has no wheezes. She has no rales.   Abdominal: She exhibits no distension and no mass. There is no tenderness. There is no guarding. No hernia.   Musculoskeletal: She exhibits no edema or tenderness.              Discharge Diagnosis and Instructions:   Pending Labs:  Unresulted Labs     None         Consultants:Plan None   Discharge Medications:     Discharge Medication List      Taking          amLODIPine 5 MG tablet   Dose:  5 mg   Commonly known as:  NORVASC   Take 1 tablet (5 mg total) by mouth daily.       aspirin EC 325 MG EC tablet   Dose:  325 mg   Take 1 tablet (325 mg total) by mouth daily.       carvedilol 25 MG tablet   Dose:  25 mg   Commonly known as:  COREG   Take 25 mg by mouth 2 (two) times daily with meals.       famotidine 20 MG tablet   Dose:  20 mg   Commonly known as:  PEPCID   Take 1 tablet (20 mg total) by mouth daily.       fluticasone 50 MCG/ACT nasal spray   Dose:  1 spray   Commonly known as:  FLONASE   1 spray by Nasal route daily.       glimepiride 4 MG tablet   Dose:  4 mg   What changed:  how much to take   Commonly known as:  AMARYL   Take 1 tablet (4 mg total) by mouth every morning with breakfast.       meclizine 25 MG tablet   Dose:  25 mg   Commonly known as:  ANTIVERT   Take 1 tablet (25 mg total) by mouth 3 (three) times daily.       metFORMIN 1000 MG tablet   Dose:  1000 mg   Commonly known as:  GLUCOPHAGE   Take 1 tablet (1,000 mg total) by mouth 2 (two) times daily with meals.       simvastatin 10 MG tablet   Dose:  10 mg   Commonly known as:  ZOCOR   Take 10  mg by mouth nightly.         STOP taking these medications          calcium citrate 950 MG tablet    Commonly known as:  CALCITRATE       hydroCHLOROthiazide 25 MG tablet   Commonly known as:  HYDRODIURIL       losartan 50 MG tablet   Commonly known as:  COZAAR            Labs/Images to be followed at your PCP office:follow with cardiology   Hospital Problems:Active Problems:    Type 2 diabetes mellitus without complication    HTN (hypertension) urgency    Hyperlipidemia    History of CVA (cerebrovascular accident)    Chest pain    SOB (shortness of breath)    Nausea    Elevated d-dimer     Lists the present on admission hospital problems:Present on Admission:   . HTN (hypertension) urgency  . Hyperlipidemia  . Type 2 diabetes mellitus without complication   Follow up:         Follow-up Information     Follow up with Pcp, [facility], MD.    Contact information:    [address]  [address]   [place] [address]  [phone]          Follow up with [facility], MD. Schedule an appointment as soon as possible for a visit in 1 week.    Specialties:  Cardiology, Internal Medicine    Contact information:    [address]  [place]  [address]  [phone]                Results of Labs/imaging:   Labs have been reviewed:   Coagulation Profile:        CBC review:   Recent Labs  Lab 10/01/15  0040 09/30/15  1721   WBC 6.34 5.42   HGB 7.9* 8.6*   HEMATOCRIT 24.2* 26.9*   PLATELETS 244 242   MCV 63.4* 65.0*   RDW 14 14   NEUTROPHILS 53 60   LYMPHOCYTES AUTOMATED 34 23   EOSINOPHILS AUTOMATED 3 4   IMMATURE GRANULOCYTE Unmeasured 0   NEUTROPHILS ABSOLUTE 3.33 3.27   ABSOLUTE IMMATURE GRANULOCYTE Unmeasured 0.01      Chem Review:  Recent Labs  Lab 10/01/15  1329 10/01/15  0040 09/30/15  1721   SODIUM 129* 128* 125*   POTASSIUM 5.1 4.4 5.2*   CHLORIDE 100 99* 93*   CO2 20* 20* 20*   BUN 20.2* 19.9* 24.5*   CREATININE 1.0 0.9 1.1*   GLUCOSE 203* 133* 150*   CALCIUM 9.0 9.0 9.7   MAGNESIUM  --   --  2.1   BILIRUBIN, TOTAL  --   --  0.2   AST (SGOT)  --   --  19   ALT  --   --  10   ALKALINE PHOSPHATASE   --   --  65      Results     Procedure Component Value Units Date/Time    Basic Metabolic Panel [id number]  (Abnormal) Collected:  10/01/15 1329    Specimen Information:  Blood Updated:  10/01/15 1405     Glucose 203 (H) mg/dL      BUN 20.2 (H) mg/dL      Creatinine 1.0 mg/dL      Calcium 9.0 mg/dL      Sodium 129 (L) mEq/L  Potassium 5.1 mEq/L      Chloride 100 mEq/L      CO2 20 (L) mEq/L      Anion Gap 9.0     GFR [id number] Collected:  10/01/15 1329     EGFR 56.0 Updated:  10/01/15 1405    Glucose Whole Blood - POCT [id number]  (Abnormal) Collected:  10/01/15 1135     POCT - Glucose Whole blood 165 (H) mg/dL Updated:  10/01/15 1136    Hemoglobin A1C [id number]  (Abnormal) Collected:  10/01/15 0040    Specimen Information:  Blood Updated:  10/01/15 0721     Hemoglobin A1C 6.3 (H) %      Average Estimated Glucose 134.1 mg/dL     IRON PROFILE [id number] Collected:  10/01/15 0040     Iron 48 ug/dL Updated:  10/01/15 0553     UIBC 224 ug/dL      TIBC 272 ug/dL      Iron Saturation 18 %     Lipid panel [id number] Collected:  10/01/15 0040    Specimen Information:  Blood Updated:  10/01/15 0553     Cholesterol 148 mg/dL      Triglycerides 74 mg/dL      HDL 49 mg/dL      LDL Calculated 84 mg/dL      VLDL Cholesterol Cal 15 mg/dL      CHOL/HDL Ratio 3.0     Hemolysis index [id number]  (Abnormal) Collected:  10/01/15 0040     Hemolysis Index 35 (H) Updated:  10/01/15 0544    TSH [id number] Collected:  10/01/15 0040    Specimen Information:  Blood Updated:  10/01/15 0133     Thyroid Stimulating Hormone 1.53 uIU/mL     Cell MorpHology [id number]  (Abnormal) Collected:  10/01/15 0040     Cell Morphology: Abnormal (A) Updated:  10/01/15 0133     Anisocytosis =1+ (A)      Microcytic =2+ (A)      Hypochromia =2+ (A)      Platelet Estimate Normal     CBC with differential [id number]  (Abnormal) Collected:  10/01/15 0040    Specimen Information:  Blood from Blood Updated:  10/01/15 0133     WBC 6.34 x10 3/uL       Hgb 7.9 (L) g/dL      Hematocrit 24.2 (L) %      Platelets 244 x10 3/uL      RBC 3.82 (L) x10 6/uL      MCV 63.4 (L) fL      MCH 20.7 (L) pg      MCHC 32.6 g/dL      RDW 14 %      MPV 10.6 fL      Neutrophils 53 %      Lymphocytes Automated 34 %      Monocytes 10 %      Eosinophils Automated 3 %      Basophils Automated 0 %      Immature Granulocyte Unmeasured %      Nucleated RBC Unmeasured /100 WBC      Neutrophils Absolute 3.33 x10 3/uL      Abs Lymph Automated 2.17 x10 3/uL      Abs Mono Automated 0.63 x10 3/uL      Abs Eos Automated 0.18 x10 3/uL      Absolute Baso Automated 0.03 x10 3/uL      Absolute Immature Granulocyte Unmeasured x10 3/uL     Troponin I [id number] Collected:  10/01/15 0040    Specimen Information:  Blood Updated:  10/01/15 0122     Troponin I 0.02 ng/mL     Basic Metabolic Panel [id number]  (Abnormal) Collected:  10/01/15 0040    Specimen Information:  Blood Updated:  10/01/15 0112     Glucose 133 (H) mg/dL      BUN 19.9 (H) mg/dL      Creatinine 0.9 mg/dL      Calcium 9.0 mg/dL      Sodium 128 (L) mEq/L      Potassium 4.4 mEq/L      Chloride 99 (L) mEq/L      CO2 20 (L) mEq/L      Anion Gap 9.0     GFR [id number] Collected:  10/01/15 0040     EGFR >60.0 Updated:  10/01/15 0112    Troponin I [id number] Collected:  09/30/15 2231    Specimen Information:  Blood Updated:  09/30/15 2318     Troponin I 0.01 ng/mL     Comprehensive metabolic panel [id number]  (Abnormal) Collected:  09/30/15 1721    Specimen Information:  Blood Updated:  09/30/15 1808     Glucose 150 (H) mg/dL      BUN 24.5 (H) mg/dL      Creatinine 1.1 (H) mg/dL      Sodium 125 (L) mEq/L      Potassium 5.2 (H) mEq/L      Chloride 93 (L) mEq/L      CO2 20 (L) mEq/L      Calcium 9.7 mg/dL      Protein, Total 6.7 g/dL      Albumin 3.6 g/dL      AST (SGOT) 19 U/L      ALT 10 U/L      Alkaline Phosphatase 65 U/L      Bilirubin, Total 0.2 mg/dL      Globulin 3.1 g/dL      Albumin/Globulin Ratio 1.2      Anion Gap 12.0      Magnesium [id number] Collected:  09/30/15 1721    Specimen Information:  Blood Updated:  09/30/15 1808     Magnesium 2.1 mg/dL     GFR [id number] Collected:  09/30/15 1721     EGFR 50.2 Updated:  09/30/15 1808    Troponin I [id number] Collected:  09/30/15 1721    Specimen Information:  Blood Updated:  09/30/15 1807     Troponin I <0.01 ng/mL     Cell MorpHology [id number]  (Abnormal) Collected:  09/30/15 1721     Cell Morphology: Abnormal (A) Updated:  09/30/15 1806     Anisocytosis =1+ (A)      Microcytic =2+ (A)      Hypochromia =1+ (A)      Platelet Estimate Normal     D-Dimer [id number]  (Abnormal) Collected:  09/30/15 1721     D-Dimer 1.60 (H) ug/mL FEU Updated:  09/30/15 1806    CBC with differential [id number]  (Abnormal) Collected:  09/30/15 1721    Specimen Information:  Blood from Blood Updated:  09/30/15 1805     WBC 5.42 x10 3/uL      Hgb 8.6 (L) g/dL      Hematocrit 26.9 (L) %      Platelets 242 x10 3/uL      RBC 4.14 (L) x10 6/uL      MCV 65.0 (L) fL      MCH 20.8 (L) pg      MCHC 32.0 g/dL  RDW 14 %      MPV 10.3 fL      Neutrophils 60 %      Lymphocytes Automated 23 %      Monocytes 12 %      Eosinophils Automated 4 %      Basophils Automated 0 %      Immature Granulocyte 0 %      Neutrophils Absolute 3.27 x10 3/uL      Abs Lymph Automated 1.27 x10 3/uL      Abs Mono Automated 0.66 x10 3/uL      Abs Eos Automated 0.20 x10 3/uL      Absolute Baso Automated 0.02 x10 3/uL      Absolute Immature Granulocyte 0.01 x10 3/uL          Radiology reports have been reviewed:  Radiology Results (24 Hour)     Procedure Component Value Units Date/Time    NM Lung Ventilation Perfusion Aerosol [id number] Collected:  10/01/15 [id number]    Order Status:  Completed Updated:  10/01/15 1259    Narrative:      History: Short of breath  anemia. Negative lower extremity Dopplers    FINDINGS: no prior VQ scans     A ventilation scan was performed with 68 mCi Tc 9m DTPA  (diethylenetriaminepentaacetic acid) aerosol and  a perfusion scan was  performed with 6 mCi Tc 86m MAA (macroaggregated albumin) injected  intravenously.  Multiple matched ventilation/perfusion images of the  lungs were obtained in different projections.      Imaging is ventilation and perfusion with soft tissue attenuation in  this obese patient. Mild central airway deposition. Several small  matched subsegmental defects are nonspecific. no unmatched segmental  perfusion defects.      Impression:      . Low probability pulmonary embolus     Nolene Bernheim, MD   10/01/2015 12:55 PM      US Venous Duplex Doppler Leg Bilateral [id number] Collected:  10/01/15 0008    Order Status:  Completed Updated:  10/01/15 0012    Narrative:      TECHNIQUE: Venous Doppler of the BILATERAL lower extremities    INDICATION: Swelling    COMPARISON:  No relevant prior examination available for comparison.    FINDINGS:     RIGHT:  COMMON FEMORAL VEIN:  Compressible, with normal color and spectral  Doppler.    FEMORAL VEIN PROXIMAL:  Compressible, with normal color and spectral  Doppler.  FEMORAL VEIN MID:  Compressible, with normal color and spectral Doppler.  FEMORAL VEIN DISTAL:  Compressible, with normal color and spectral  Doppler.    POPLITEAL VEIN:  Compressible, with normal color and spectral Doppler.    POSTERIOR TIBIAL/PERONEAL VEINS:  Visualized calf veins are patent.    OTHER:   No Baker's cyst identified.      LEFT:  COMMON FEMORAL VEIN:  Compressible, with normal color and spectral  Doppler.    FEMORAL VEIN PROXIMAL:  Compressible, with normal color and spectral  Doppler.  FEMORAL VEIN MID:  Compressible, with normal color and spectral Doppler.  FEMORAL VEIN DISTAL:  Compressible, with normal color and spectral  Doppler.    POPLITEAL VEIN:  Compressible, with normal color and spectral Doppler.    POSTERIOR TIBIAL/PERONEAL VEINS:  Visualized calf veins are patent.    OTHER:   No Baker's cyst identified.        Impression:         No deep venous thrombosis of the BILATERAL  lower extremities.    Loyal Buba, MD   10/01/2015 12:08 AM      XR Chest  AP Portable [id number] Collected:  09/30/15 1907    Order Status:  Completed Updated:  09/30/15 1912    Narrative:      HISTORY: Chest pain    COMPARISON: 09/21/2025    FINDINGS: Portable AP view performed. No significant change. The lungs  are clear with normal pulmonary vascularity. No pleural abnormality.  Heart, mediastinum, and hila are normal. Trachea is midline. Osseous  structures are unremarkable.          Impression:       No acute findings.    Susy Manor, MD   09/30/2015 7:08 PM          Xr Chest 2 Views    09/22/2015  CLINICAL HISTORY: Elevated blood pressures. PA and lateral radiographs of the chest were obtained. COMPARISON:  06/03/2014.. The cardiac silhouette is within normal limits. Minimal crowding of markings is seen within the lung bases. There is no evidence of congestive heart failure, focal pneumonia, pleural effusion or pneumothorax. Degenerative changes are seen within the dorsal spine.     09/22/2015  Minimal crowding of markings within the lung bases - grossly unchanged. There is no acute pneumonia. Burnis Medin, MD 09/22/2015 3:46 PM     Nm Lung Ventilation Perfusion Aerosol    10/01/2015  History: Short of breath  anemia. Negative lower extremity Dopplers FINDINGS: no prior VQ scans A ventilation scan was performed with 68 mCi Tc 38m DTPA (diethylenetriaminepentaacetic acid) aerosol and a perfusion scan was performed with 6 mCi Tc 80m MAA (macroaggregated albumin) injected intravenously.  Multiple matched ventilation/perfusion images of the lungs were obtained in different projections.  Imaging is ventilation and perfusion with soft tissue attenuation in this obese patient. Mild central airway deposition. Several small matched subsegmental defects are nonspecific. no unmatched segmental perfusion defects.     10/01/2015  . Low probability pulmonary embolus Nolene Bernheim, MD 10/01/2015 12:55 PM     Xr Chest  Ap  Portable    09/30/2015  HISTORY: Chest pain COMPARISON: 09/21/2025 FINDINGS: Portable AP view performed. No significant change. The lungs are clear with normal pulmonary vascularity. No pleural abnormality. Heart, mediastinum, and hila are normal. Trachea is midline. Osseous structures are unremarkable.         09/30/2015   No acute findings. Susy Manor, MD 09/30/2015 7:08 PM     US Venous Duplex Doppler Leg Bilateral    10/01/2015  TECHNIQUE: Venous Doppler of the BILATERAL lower extremities INDICATION: Swelling COMPARISON:  No relevant prior examination available for comparison. FINDINGS: RIGHT: COMMON FEMORAL VEIN:  Compressible, with normal color and spectral Doppler. FEMORAL VEIN PROXIMAL:  Compressible, with normal color and spectral Doppler. FEMORAL VEIN MID:  Compressible, with normal color and spectral Doppler. FEMORAL VEIN DISTAL:  Compressible, with normal color and spectral Doppler. POPLITEAL VEIN:  Compressible, with normal color and spectral Doppler. POSTERIOR TIBIAL/PERONEAL VEINS:  Visualized calf veins are patent. OTHER:   No Baker's cyst identified. LEFT: COMMON FEMORAL VEIN:  Compressible, with normal color and spectral Doppler. FEMORAL VEIN PROXIMAL:  Compressible, with normal color and spectral Doppler. FEMORAL VEIN MID:  Compressible, with normal color and spectral Doppler. FEMORAL VEIN DISTAL:  Compressible, with normal color and spectral Doppler. POPLITEAL VEIN:  Compressible, with normal color and spectral Doppler. POSTERIOR TIBIAL/PERONEAL VEINS:  Visualized calf veins are patent. OTHER:   No Baker's cyst identified.  10/01/2015   No deep venous thrombosis of the BILATERAL lower extremities. Loyal Buba, MD 10/01/2015 12:08 AM      Pathology:   Specimens     None             Hospitalist:   Signed by: Sadie Haber   10/01/2015 4:42 PM   Time spent for discharge: 30 minutes

## 2015-10-01 NOTE — UM Notes (Signed)
Chart reviewed. Pt showing as IP but only MD order is actually for OBS noted 09/30/15 2039. H&P also states intent is OBS.   (Event management is incorrectly showing pt as IP)  Pt appears to meet MMG for IP level of care.  Paging MD to discuss poss change to IP status if pt is not DCing today.    I spoke w/ Dr Madilyn Fireman and he confirms admit intent is OBS. MD also says he is expecting to [place] pt later today.   Event management is corrected to reflect OBS status as per MD order and above.    However, MD does agree to change pt to IP status if pt does NOT [place] due to hyponatremia, BP, etc.        65 yo female to ED 09/30/15 1853 with c/o Chest pain and SOB x 1 week. Seen at [facility] last week for same and noted to have HTN, Carvedilol 25 mg bid started and family states pt has been more weak/ dizzy and now also with CP.  97.6, HR 81, RR 18, BP 204/137 - 204/95, sats 97%  PMH : DM, HTN, high chol, CVA, bilat cataracts  H&H 8.6/26.9, RBC 4.14, Glucose 150, BUN 24.5, Cr 1.1, Na 125, K 5.2, Chlor 93, Co2 20, Ddimer 1.60  CXRY : no acute abn  ABN EKG : NORMAL SINUS RHYTHM. Cannot rule out ANTERIOR MYOCARDIAL INFARCTION , AGE UNDETERMINED  In ED : IV bolus 537ml, 324mg  po ASA  Admit : Atypical CP w/ SOB, AKI and Hyponatremia  OBS admit order noted 09/30/15 2039, tele, po ASA, Coreg, Pepcid, Amaryl, Zocor, .9% NaCl infusion 160ml/h, prn IV Narcan, sliding scale insulin, serial CEs, Cardio, diet as tol, I/O, VS q4        On tele 10/01/15 :  97.5, HR 76, RR 18, BPs to 160s/80s, sats 97%  Na 128, H&H 7.9/24.2, RBC 3.82, Glucose 133, BUN 19.9, Chlor 99, Co2 20     10/01/15 : US Venous dopp : no DVT    Cardio consult 10/01/15 :  Assessment:    Chest pain   Shortness of breath   HTN   Hx of CVA in 2014   DM    Recommendations:    No ACS   Outpatient echo for dyspnea, Lexiscan nuclear stress test given inability to exercise on treadmill, and f/u   Add amlodipine for BP.

## 2015-10-01 NOTE — Progress Notes (Signed)
Patient aox3. No c/o dizziness. She was wheeled down to car to go home with her daughter. D/C instructions reviewed and signed by daughter. Prescriptions given.

## 2015-10-01 NOTE — Consults (Signed)
[facility]    Date Time: 10/01/2015 11:32 AM  Patient Name: Jacqueline Weber  Requesting Physician: Sadie Haber, MD       Reason for Consultation:   Chest pain, shortness of breath      History:   Jacqueline Weber is a 65 y.o. female admitted on 09/30/2015.  We have been asked by Sadie Haber, MD,  to provide cardiac consultation, regarding chest pain and shortness of breath.  History is obtained from the daughter at bedside.  Patient presented to ED with 2 weeks of shortness of breath worse with exertion, dizziness, and intermittent chest pain.  Pain described as burning not with exertion.  No associated symptoms, alleviating factors, or aggravating factors.  No symptoms currently.  Patient is unable to exercise on treadmill.  She has had a stroke in the past but no cardiac events.  Troponin levels WNL.  No acute ischemic changes on ECG.    Past Medical History:     Past Medical History   Diagnosis Date   . Diabetes mellitus    . Hypertension    . Hypercholesteremia    . CVA (cerebral infarction) 03/2013   . Cataracts, bilateral    . Cerebrovascular accident        Past Surgical History:   History reviewed. No pertinent past surgical history.    Family History:     Family History   Problem Relation Age of Onset   . No known problems Mother    . No known problems Father        Social History:     Social History     Social History   . Marital Status: Widowed     Spouse Name: N/A   . Number of Children: N/A   . Years of Education: N/A     Social History Main Topics   . Smoking status: Never Smoker    . Smokeless tobacco: Not on file   . Alcohol Use: No   . Drug Use: No   . Sexual Activity: Not on file     Other Topics Concern   . Not on file     Social History Narrative       Allergies:     Allergies   Allergen Reactions   . Mosquito (Culex Pipiens) Allergy Skin Test Shortness Of Breath     And  Trouble  breathing   . Lisinopril    . Penicillins    .  Shellfish-Derived Products Hives     New  allergy       Medications:     Prescriptions prior to admission   Medication Sig   . aspirin EC 325 MG EC tablet Take 1 tablet (325 mg total) by mouth daily.   . calcium citrate (CALCITRATE) 950 MG tablet Take 1 tablet by mouth daily.   . carvedilol (COREG) 25 MG tablet Take 25 mg by mouth 2 (two) times daily with meals.      . fluticasone (FLONASE) 50 MCG/ACT nasal spray 1 spray by Nasal route daily.   [name] glimepiride (AMARYL) 4 MG tablet Take 1 tablet (4 mg total) by mouth every morning with breakfast. (Patient taking differently: Take 2 mg by mouth every morning with breakfast.   )   . hydroCHLOROthiazide (HYDRODIURIL) 25 MG tablet Take 25 mg by mouth daily.   [name] losartan (COZAAR) 50 MG tablet Take 1 tablet (50 mg total) by mouth 2 (two) times daily. (Patient taking differently:  Take 100 mg by mouth daily.   )   . metFORMIN (GLUCOPHAGE) 1000 MG tablet Take 1 tablet (1,000 mg total) by mouth 2 (two) times daily with meals.   . simvastatin (ZOCOR) 10 MG tablet Take 10 mg by mouth nightly.       Current Facility-Administered Medications   Medication Dose Route Frequency Provider Last Rate Last Dose   . 0.9%  NaCl infusion   Intravenous Continuous Sandy Salaam, NP 100 mL/hr at 09/30/15 2236     . aspirin chewable tablet 81 mg  81 mg Oral Daily Sandy Salaam, NP   81 mg at 10/01/15 0943   . carvedilol (COREG) tablet 25 mg  25 mg Oral Q12H [place] Sandy Salaam, NP   25 mg at 10/01/15 0943   . dextrose (GLUCOSE) 40 % oral gel 15 g of glucose  15 g of glucose Oral PRN Sandy Salaam, NP        And   . dextrose 50 % bolus 25 mL  25 mL Intravenous PRN Frohm, Lucilla Lame, NP        And   . glucagon (rDNA) (GLUCAGEN) injection 1 mg  1 mg Intramuscular PRN Sandy Salaam, NP       . famotidine (PEPCID) tablet 20 mg  20 mg Oral Daily Sandy Salaam, NP   20 mg at 10/01/15 0943   . glimepiride (AMARYL) tablet 2 mg  2 mg Oral QAM W/BREAKFAST Sandy Salaam, NP   2 mg at 10/01/15 0943   . insulin lispro (HumaLOG) injection 1-3 Units  1-3 Units Subcutaneous QHS PRN Frohm, Lucilla Lame, NP       . insulin lispro (HumaLOG) injection 1-5 Units  1-5 Units Subcutaneous TID AC PRN Frohm, Lucilla Lame, NP       . naloxone [facility]) injection 0.2 mg  0.2 mg Intravenous PRN Sandy Salaam, NP       . simvastatin (ZOCOR) tablet 10 mg  10 mg Oral QHS Sandy Salaam, NP   10 mg at 09/30/15 2236         Review of Systems:    Comprehensive review of systems including constitutional, eyes, ears, nose, mouth, throat, cardiovascular, GI, GU, musculoskeletal, integumentary, respiratory, neurologic, psychiatric, and endocrine is negative other than what is mentioned already in the history of present illness    Physical Exam:     Filed Vitals:    10/01/15 0942   BP: 160/74   Pulse: 76   Temp: 97.4 F (36.3 C)   Resp: 16   SpO2: 97%     Temp (24hrs), Avg:97.3 F (36.3 C), Min:97 F (36.1 C), Max:97.6 F (36.4 C)      Intake and Output Summary (Last 24 hours) at Date Time  No intake or output data in the 24 hours ending 10/01/15 1132    GENERAL: Patient is in no acute distress   HEENT: No scleral icterus or conjunctival pallor, moist mucous membranes   NECK: No jugular venous distention or thyromegaly, normal carotid upstrokes without bruits   CARDIAC: Normal apical impulse, regular rate and rhythm, with normal S1 and S2, and no murmurs, rubs, or gallops   CHEST: Clear to auscultation bilaterally, normal respiratory effort  ABDOMEN: No abdominal bruits, masses, or hepatosplenomegaly, nontender, non-distended, good bowel sounds   EXTREMITIES: No clubbing, cyanosis, or edema, 2+ DP, PT, and femoral pulses bilaterally without bruits  SKIN: No rash or jaundice   NEUROLOGIC: Alert and  oriented to time, place and person, normal mood and affect    MUSCULOSKELETAL: Normal muscle strength and tone.      Labs Reviewed:       Recent Labs  Lab 10/01/15  0040 09/30/15  2231  09/30/15  1721   TROPONIN I 0.02 0.01 <0.01           Recent Labs  Lab 10/01/15  0040   CHOLESTEROL 148   TRIGLYCERIDES 74   HDL 49   LDL CALCULATED 84       Recent Labs  Lab 09/30/15  1721   BILIRUBIN, TOTAL 0.2   PROTEIN, TOTAL 6.7   ALBUMIN 3.6   ALT 10   AST (SGOT) 19       Recent Labs  Lab 09/30/15  1721   MAGNESIUM 2.1           Recent Labs  Lab 10/01/15  0040 09/30/15  1721   WBC 6.34 5.42   HGB 7.9* 8.6*   HEMATOCRIT 24.2* 26.9*   PLATELETS 244 242       Recent Labs  Lab 10/01/15  0040 09/30/15  1721   SODIUM 128* 125*   POTASSIUM 4.4 5.2*   CHLORIDE 99* 93*   CO2 20* 20*   BUN 19.9* 24.5*   CREATININE 0.9 1.1*   EGFR >60.0 50.2   GLUCOSE 133* 150*   CALCIUM 9.0 9.7         Radiology   Radiological Procedure reviewed.      chest X-ray  Assessment:    Chest pain   Shortness of breath   HTN   Hx of CVA in 2014   DM    Recommendations:    No ACS   Outpatient echo for dyspnea, Lexiscan nuclear stress test given inability to exercise on treadmill, and f/u   Add amlodipine for BP.    Signed by: Emeline General, MD      [facility]  NP [place] (8am-5pm)  MD [employer] [phone] (8am-5pm)  After hours, non urgent consult line [phone]  After Hours, urgent consults [phone]

## 2015-10-15 ENCOUNTER — Other Ambulatory Visit: Payer: Self-pay | Admitting: Cardiovascular Disease

## 2015-10-15 DIAGNOSIS — R079 Chest pain, unspecified: Secondary | ICD-10-CM

## 2015-10-20 ENCOUNTER — Other Ambulatory Visit: Payer: Self-pay | Admitting: Cardiovascular Disease

## 2015-10-20 ENCOUNTER — Ambulatory Visit
Admission: RE | Admit: 2015-10-20 | Discharge: 2015-10-20 | Disposition: A | Discharge status: Home / Self Care | Payer: Charity | Source: Ambulatory Visit | Attending: Cardiovascular Disease | Admitting: Cardiovascular Disease

## 2015-10-20 ENCOUNTER — Other Ambulatory Visit (INDEPENDENT_AMBULATORY_CARE_PROVIDER_SITE_OTHER): Payer: Self-pay

## 2015-10-20 ENCOUNTER — Ambulatory Visit (INDEPENDENT_AMBULATORY_CARE_PROVIDER_SITE_OTHER): Payer: Self-pay

## 2015-10-20 DIAGNOSIS — I5189 Other ill-defined heart diseases: Secondary | ICD-10-CM | POA: Insufficient documentation

## 2015-10-20 DIAGNOSIS — I351 Nonrheumatic aortic (valve) insufficiency: Secondary | ICD-10-CM | POA: Insufficient documentation

## 2015-10-20 DIAGNOSIS — R079 Chest pain, unspecified: Secondary | ICD-10-CM

## 2015-10-20 LAB — VAHRT HISTORIC LVEF: Ejection Fraction: 70 %

## 2015-10-28 ENCOUNTER — Inpatient Hospital Stay
Admission: RE | Admit: 2015-10-28 | Discharge: 2015-10-28 | Disposition: A | Discharge status: Home / Self Care | Payer: Medicare Other | Source: Ambulatory Visit | Attending: Cardiovascular Disease | Admitting: Cardiovascular Disease

## 2015-10-28 DIAGNOSIS — I256 Silent myocardial ischemia: Secondary | ICD-10-CM | POA: Insufficient documentation

## 2015-10-28 DIAGNOSIS — R079 Chest pain, unspecified: Secondary | ICD-10-CM

## 2015-10-28 DIAGNOSIS — R9430 Abnormal result of cardiovascular function study, unspecified: Secondary | ICD-10-CM | POA: Insufficient documentation

## 2015-10-28 MED ORDER — REGADENOSON 0.4 MG/5ML IV SOLN
0.4000 mg | Freq: Once | INTRAVENOUS | Status: AC | PRN
Start: 2015-10-28 — End: 2015-10-28
  Administered 2015-10-28: 0.4 mg via INTRAVENOUS
  Filled 2015-10-28: qty 5

## 2015-10-28 MED ORDER — TECHNETIUM TC 99M TETROFOSMIN INJECTION
1.0000 | Freq: Once | Status: AC | PRN
Start: 2015-10-28 — End: 2015-10-28
  Administered 2015-10-28: 1 via INTRAVENOUS
  Filled 2015-10-28: qty 1

## 2015-11-18 ENCOUNTER — Ambulatory Visit (INDEPENDENT_AMBULATORY_CARE_PROVIDER_SITE_OTHER): Payer: Self-pay | Admitting: Cardiovascular Disease

## 2015-11-18 DIAGNOSIS — R0989 Other specified symptoms and signs involving the circulatory and respiratory systems: Secondary | ICD-10-CM | POA: Insufficient documentation

## 2015-12-14 ENCOUNTER — Other Ambulatory Visit: Payer: Self-pay | Admitting: Cardiovascular Disease

## 2015-12-14 DIAGNOSIS — R9430 Abnormal result of cardiovascular function study, unspecified: Secondary | ICD-10-CM

## 2015-12-22 ENCOUNTER — Ambulatory Visit: Payer: Charity

## 2015-12-31 ENCOUNTER — Ambulatory Visit (INDEPENDENT_AMBULATORY_CARE_PROVIDER_SITE_OTHER): Payer: Self-pay | Admitting: Cardiovascular Disease

## 2016-01-20 ENCOUNTER — Other Ambulatory Visit: Payer: Self-pay

## 2016-03-30 ENCOUNTER — Ambulatory Visit (INDEPENDENT_AMBULATORY_CARE_PROVIDER_SITE_OTHER): Payer: Self-pay

## 2016-03-30 ENCOUNTER — Ambulatory Visit
Admission: RE | Admit: 2016-03-30 | Discharge: 2016-03-30 | Disposition: A | Discharge status: Home / Self Care | Payer: Charity | Source: Ambulatory Visit | Attending: Cardiovascular Disease | Admitting: Cardiovascular Disease

## 2016-03-30 DIAGNOSIS — R9430 Abnormal result of cardiovascular function study, unspecified: Secondary | ICD-10-CM | POA: Insufficient documentation

## 2016-03-30 DIAGNOSIS — I6523 Occlusion and stenosis of bilateral carotid arteries: Secondary | ICD-10-CM | POA: Insufficient documentation

## 2016-04-10 ENCOUNTER — Ambulatory Visit (INDEPENDENT_AMBULATORY_CARE_PROVIDER_SITE_OTHER): Payer: Self-pay | Admitting: Cardiovascular Disease

## 2016-04-10 DIAGNOSIS — I25119 Atherosclerotic heart disease of native coronary artery with unspecified angina pectoris: Secondary | ICD-10-CM | POA: Insufficient documentation

## 2016-04-24 ENCOUNTER — Ambulatory Visit (INDEPENDENT_AMBULATORY_CARE_PROVIDER_SITE_OTHER): Payer: Self-pay | Admitting: Cardiovascular Disease

## 2016-05-03 ENCOUNTER — Ambulatory Visit (INDEPENDENT_AMBULATORY_CARE_PROVIDER_SITE_OTHER): Payer: Self-pay | Admitting: Cardiovascular Disease

## 2016-05-05 ENCOUNTER — Encounter: Payer: Self-pay | Admitting: Cardiovascular Disease

## 2016-05-16 ENCOUNTER — Other Ambulatory Visit (INDEPENDENT_AMBULATORY_CARE_PROVIDER_SITE_OTHER): Payer: Self-pay

## 2016-05-16 ENCOUNTER — Ambulatory Visit (INDEPENDENT_AMBULATORY_CARE_PROVIDER_SITE_OTHER): Payer: Self-pay

## 2016-05-16 ENCOUNTER — Emergency Department: Payer: Medicare Other

## 2016-05-16 ENCOUNTER — Inpatient Hospital Stay: Payer: Medicare Other | Admitting: Critical Care Medicine

## 2016-05-16 ENCOUNTER — Inpatient Hospital Stay: Payer: Medicare Other

## 2016-05-16 ENCOUNTER — Inpatient Hospital Stay
Admission: EM | Admit: 2016-05-16 | Discharge: 2016-05-19 | DRG: 281 | Disposition: A | Discharge status: Home Health | Payer: Medicare Other | Attending: Internal Medicine | Admitting: Internal Medicine

## 2016-05-16 DIAGNOSIS — J810 Acute pulmonary edema: Secondary | ICD-10-CM

## 2016-05-16 DIAGNOSIS — I160 Hypertensive urgency: Secondary | ICD-10-CM | POA: Diagnosis present

## 2016-05-16 DIAGNOSIS — H269 Unspecified cataract: Secondary | ICD-10-CM | POA: Diagnosis present

## 2016-05-16 DIAGNOSIS — E872 Acidosis: Secondary | ICD-10-CM | POA: Diagnosis present

## 2016-05-16 DIAGNOSIS — N179 Acute kidney failure, unspecified: Secondary | ICD-10-CM | POA: Diagnosis present

## 2016-05-16 DIAGNOSIS — I214 Non-ST elevation (NSTEMI) myocardial infarction: Principal | ICD-10-CM | POA: Diagnosis present

## 2016-05-16 DIAGNOSIS — E7800 Pure hypercholesterolemia, unspecified: Secondary | ICD-10-CM | POA: Diagnosis present

## 2016-05-16 DIAGNOSIS — I130 Hypertensive heart and chronic kidney disease with heart failure and stage 1 through stage 4 chronic kidney disease, or unspecified chronic kidney disease: Secondary | ICD-10-CM | POA: Diagnosis present

## 2016-05-16 DIAGNOSIS — D6489 Other specified anemias: Secondary | ICD-10-CM

## 2016-05-16 DIAGNOSIS — Z7984 Long term (current) use of oral hypoglycemic drugs: Secondary | ICD-10-CM

## 2016-05-16 DIAGNOSIS — D631 Anemia in chronic kidney disease: Secondary | ICD-10-CM | POA: Diagnosis present

## 2016-05-16 DIAGNOSIS — I161 Hypertensive emergency: Secondary | ICD-10-CM | POA: Diagnosis present

## 2016-05-16 DIAGNOSIS — I509 Heart failure, unspecified: Secondary | ICD-10-CM | POA: Diagnosis present

## 2016-05-16 DIAGNOSIS — I2510 Atherosclerotic heart disease of native coronary artery without angina pectoris: Secondary | ICD-10-CM | POA: Diagnosis present

## 2016-05-16 DIAGNOSIS — E1122 Type 2 diabetes mellitus with diabetic chronic kidney disease: Secondary | ICD-10-CM | POA: Diagnosis present

## 2016-05-16 DIAGNOSIS — N189 Chronic kidney disease, unspecified: Secondary | ICD-10-CM | POA: Diagnosis present

## 2016-05-16 DIAGNOSIS — I69354 Hemiplegia and hemiparesis following cerebral infarction affecting left non-dominant side: Secondary | ICD-10-CM

## 2016-05-16 DIAGNOSIS — Z880 Allergy status to penicillin: Secondary | ICD-10-CM

## 2016-05-16 DIAGNOSIS — Z79899 Other long term (current) drug therapy: Secondary | ICD-10-CM

## 2016-05-16 DIAGNOSIS — D649 Anemia, unspecified: Secondary | ICD-10-CM | POA: Diagnosis present

## 2016-05-16 DIAGNOSIS — I080 Rheumatic disorders of both mitral and aortic valves: Secondary | ICD-10-CM | POA: Diagnosis present

## 2016-05-16 DIAGNOSIS — Z7982 Long term (current) use of aspirin: Secondary | ICD-10-CM

## 2016-05-16 DIAGNOSIS — R0603 Acute respiratory distress: Secondary | ICD-10-CM

## 2016-05-16 LAB — ECG 12-LEAD
Atrial Rate: 116 {beats}/min
P Axis: 48 degrees
P-R Interval: 164 ms
Q-T Interval: 294 ms
QRS Duration: 86 ms
QTC Calculation (Bezet): 408 ms
R Axis: 27 degrees
T Axis: 180 degrees
Ventricular Rate: 116 {beats}/min

## 2016-05-16 LAB — URINALYSIS, REFLEX TO MICROSCOPIC EXAM IF INDICATED
Bilirubin, UA: NEGATIVE
Glucose, UA: 50 — AB
Ketones UA: NEGATIVE
Leukocyte Esterase, UA: NEGATIVE
Nitrite, UA: NEGATIVE
Protein, UR: 100 — AB
Specific Gravity UA: 1.006 (ref 1.001–1.035)
Urine pH: 6 (ref 5.0–8.0)
Urobilinogen, UA: NORMAL mg/dL

## 2016-05-16 LAB — COMPREHENSIVE METABOLIC PANEL
ALT: 15 U/L (ref 0–55)
AST (SGOT): 20 U/L (ref 5–34)
Albumin/Globulin Ratio: 0.8 — ABNORMAL LOW (ref 0.9–2.2)
Albumin: 2.6 g/dL — ABNORMAL LOW (ref 3.5–5.0)
Alkaline Phosphatase: 68 U/L (ref 37–106)
Anion Gap: 9 (ref 5.0–15.0)
BUN: 38.2 mg/dL — ABNORMAL HIGH (ref 7.0–19.0)
Bilirubin, Total: 0.1 mg/dL — ABNORMAL LOW (ref 0.2–1.2)
CO2: 19 mEq/L — ABNORMAL LOW (ref 22–29)
Calcium: 8.8 mg/dL (ref 8.5–10.5)
Chloride: 106 mEq/L (ref 100–111)
Creatinine: 2.1 mg/dL — ABNORMAL HIGH (ref 0.6–1.0)
Globulin: 3.2 g/dL (ref 2.0–3.6)
Glucose: 195 mg/dL — ABNORMAL HIGH (ref 70–100)
Potassium: 4.6 mEq/L (ref 3.5–5.1)
Protein, Total: 5.8 g/dL — ABNORMAL LOW (ref 6.0–8.3)
Sodium: 134 mEq/L — ABNORMAL LOW (ref 136–145)

## 2016-05-16 LAB — CBC AND DIFFERENTIAL
Absolute NRBC: 0 10*3/uL
Basophils Absolute Automated: 0.02 10*3/uL (ref 0.00–0.20)
Basophils Automated: 0.2 %
Eosinophils Absolute Automated: 0.34 10*3/uL (ref 0.00–0.70)
Eosinophils Automated: 2.9 %
Hematocrit: 24 % — ABNORMAL LOW (ref 37.0–47.0)
Hgb: 7.4 g/dL — ABNORMAL LOW (ref 12.0–16.0)
Immature Granulocytes Absolute: 0.04 10*3/uL
Immature Granulocytes: 0.3 %
Lymphocytes Absolute Automated: 2.32 10*3/uL (ref 0.50–4.40)
Lymphocytes Automated: 19.6 %
MCH: 20.5 pg — ABNORMAL LOW (ref 28.0–32.0)
MCHC: 30.8 g/dL — ABNORMAL LOW (ref 32.0–36.0)
MCV: 66.5 fL — ABNORMAL LOW (ref 80.0–100.0)
MPV: 11 fL (ref 9.4–12.3)
Monocytes Absolute Automated: 0.53 10*3/uL (ref 0.00–1.20)
Monocytes: 4.5 %
Neutrophils Absolute: 8.6 10*3/uL — ABNORMAL HIGH (ref 1.80–8.10)
Neutrophils: 72.5 %
Nucleated RBC: 0 /100 WBC (ref 0.0–1.0)
Platelets: 262 10*3/uL (ref 140–400)
RBC: 3.61 10*6/uL — ABNORMAL LOW (ref 4.20–5.40)
RDW: 16 % — ABNORMAL HIGH (ref 12–15)
WBC: 11.85 10*3/uL — ABNORMAL HIGH (ref 3.50–10.80)

## 2016-05-16 LAB — PROTEIN / CREATININE RATIO, URINE
Urine Creatinine, Random: 20.9 mg/dL
Urine Protein Random: 167.2 mg/dL — ABNORMAL HIGH (ref 1.0–14.0)
Urine Protein/Creatinine Ratio: 8

## 2016-05-16 LAB — ARTERIAL BLOOD GAS (~~LOC~~)
BIPAP(E): 5
BIPAP(I): 10
Base Excess, Arterial: -2.2 mEq/L — ABNORMAL LOW (ref <=2.0)
FIO2: 40 %
HCO3, Arterial: 23.2 mEq/L (ref 22.0–28.0)
O2 Sat, Arterial: 99 % (ref 95.0–100.0)
Rate: 12
Temperature: 98.6
pCO2, Arterial: 41 mmhg (ref 35.0–45.0)
pH, Arterial: 7.36 (ref 7.35–7.45)
pO2, Arterial: 162 mmhg — ABNORMAL HIGH (ref 80.0–100.0)

## 2016-05-16 LAB — LIPID PANEL
Cholesterol / HDL Ratio: 4
Cholesterol: 186 mg/dL (ref 0–199)
HDL: 47 mg/dL (ref 40–9999)
LDL Calculated: 108 mg/dL — ABNORMAL HIGH (ref 0–99)
Triglycerides: 153 mg/dL — ABNORMAL HIGH (ref 34–149)
VLDL Calculated: 31 mg/dL (ref 10–40)

## 2016-05-16 LAB — IRON PROFILE
Iron Saturation: 19 % (ref 15–50)
Iron: 42 ug/dL (ref 40–145)
TIBC: 220 ug/dL — ABNORMAL LOW (ref 265–497)
UIBC: 178 ug/dL (ref 126–382)

## 2016-05-16 LAB — CK: Creatine Kinase (CK): 235 U/L — ABNORMAL HIGH (ref 29–168)

## 2016-05-16 LAB — HEMOLYSIS INDEX
Hemolysis Index: 55 — ABNORMAL HIGH (ref 0–18)
Hemolysis Index: 56 — ABNORMAL HIGH (ref 0–18)

## 2016-05-16 LAB — GLUCOSE WHOLE BLOOD - POCT
Whole Blood Glucose POCT: 130 mg/dL — ABNORMAL HIGH (ref 70–100)
Whole Blood Glucose POCT: 163 mg/dL — ABNORMAL HIGH (ref 70–100)

## 2016-05-16 LAB — B-TYPE NATRIURETIC PEPTIDE: B-Natriuretic Peptide: 296.6 pg/mL — ABNORMAL HIGH (ref 0.0–100.0)

## 2016-05-16 LAB — MAGNESIUM: Magnesium: 1.4 mg/dL — ABNORMAL LOW (ref 1.6–2.6)

## 2016-05-16 LAB — TSH: TSH: 1.02 u[IU]/mL (ref 0.35–4.94)

## 2016-05-16 LAB — GFR: EGFR: 23.7

## 2016-05-16 LAB — TROPONIN I
Troponin I: 0.05 ng/mL (ref 0.00–0.09)
Troponin I: 8.03 ng/mL (ref 0.00–0.09)
Troponin I: 7.24 ng/mL (ref 0.00–0.09)

## 2016-05-16 MED ORDER — LORAZEPAM 2 MG/ML IJ SOLN
1.0000 mg | Freq: Once | INTRAMUSCULAR | Status: AC
Start: 2016-05-16 — End: 2016-05-16

## 2016-05-16 MED ORDER — AMLODIPINE BESYLATE 5 MG PO TABS
10.0000 mg | ORAL_TABLET | Freq: Every day | ORAL | Status: DC
Start: 2016-05-17 — End: 2016-05-19
  Administered 2016-05-17 – 2016-05-19 (×3): 10 mg via ORAL
  Filled 2016-05-16 (×3): qty 2

## 2016-05-16 MED ORDER — FUROSEMIDE 10 MG/ML IJ SOLN
40.0000 mg | Freq: Once | INTRAMUSCULAR | Status: AC
Start: 2016-05-16 — End: 2016-05-16
  Administered 2016-05-16: 40 mg via INTRAVENOUS
  Filled 2016-05-16: qty 4

## 2016-05-16 MED ORDER — CARVEDILOL 12.5 MG PO TABS
25.0000 mg | ORAL_TABLET | Freq: Two times a day (BID) | ORAL | Status: DC
Start: 2016-05-17 — End: 2016-05-19
  Administered 2016-05-17 – 2016-05-19 (×5): 25 mg via ORAL
  Filled 2016-05-16 (×5): qty 2

## 2016-05-16 MED ORDER — DEXTROSE 10 % IV BOLUS
125.0000 mL | INTRAVENOUS | Status: DC | PRN
Start: 2016-05-16 — End: 2016-05-19

## 2016-05-16 MED ORDER — DEXTROSE 10 % IV BOLUS
125.0000 mL | INTRAVENOUS | Status: DC | PRN
Start: 2016-05-16 — End: 2016-05-16

## 2016-05-16 MED ORDER — GLUCOSE 40 % PO GEL
15.0000 g | ORAL | Status: DC | PRN
Start: 2016-05-16 — End: 2016-05-19

## 2016-05-16 MED ORDER — NITROGLYCERIN IN D5W 200-5 MCG/ML-% IV SOLN
200.0000 ug/min | INTRAVENOUS | Status: DC
Start: 2016-05-16 — End: 2016-05-18
  Administered 2016-05-16: 80 ug/min via INTRAVENOUS
  Administered 2016-05-16: 70 ug/min via INTRAVENOUS
  Administered 2016-05-16: 200 ug/min via INTRAVENOUS
  Administered 2016-05-17: 30 ug/min via INTRAVENOUS
  Filled 2016-05-16 (×3): qty 250

## 2016-05-16 MED ORDER — INSULIN LISPRO 100 UNIT/ML SC SOLN
1.0000 [IU] | Freq: Every evening | SUBCUTANEOUS | Status: DC | PRN
Start: 2016-05-16 — End: 2016-05-16

## 2016-05-16 MED ORDER — GLUCOSE 40 % PO GEL
15.0000 g | ORAL | Status: DC | PRN
Start: 2016-05-16 — End: 2016-05-16

## 2016-05-16 MED ORDER — SIMVASTATIN 10 MG PO TABS
10.0000 mg | ORAL_TABLET | Freq: Every evening | ORAL | Status: DC
Start: 2016-05-16 — End: 2016-05-19
  Administered 2016-05-16 – 2016-05-19 (×3): 10 mg via ORAL
  Filled 2016-05-16 (×3): qty 1

## 2016-05-16 MED ORDER — HEPARIN SODIUM (PORCINE) 5000 UNIT/ML IJ SOLN
5000.0000 [IU] | Freq: Two times a day (BID) | INTRAMUSCULAR | Status: DC
Start: 2016-05-16 — End: 2016-05-19
  Administered 2016-05-16 – 2016-05-19 (×6): 5000 [IU] via SUBCUTANEOUS
  Filled 2016-05-16 (×6): qty 1

## 2016-05-16 MED ORDER — CLONIDINE HCL 0.1 MG PO TABS
0.1000 mg | ORAL_TABLET | Freq: Two times a day (BID) | ORAL | Status: DC
Start: 2016-05-16 — End: 2016-05-19
  Administered 2016-05-16 – 2016-05-19 (×8): 0.1 mg via ORAL
  Filled 2016-05-16 (×8): qty 1

## 2016-05-16 MED ORDER — HYDRALAZINE HCL 50 MG PO TABS
100.0000 mg | ORAL_TABLET | Freq: Two times a day (BID) | ORAL | Status: DC
Start: 2016-05-16 — End: 2016-05-19
  Administered 2016-05-16 – 2016-05-19 (×7): 100 mg via ORAL
  Filled 2016-05-16: qty 4
  Filled 2016-05-16 (×4): qty 2
  Filled 2016-05-16 (×2): qty 4

## 2016-05-16 MED ORDER — MAGNESIUM SULFATE IN D5W 1-5 GM/100ML-% IV SOLN
1.0000 g | INTRAVENOUS | Status: AC
Start: 2016-05-16 — End: 2016-05-16
  Administered 2016-05-16 (×3): 1 g via INTRAVENOUS
  Filled 2016-05-16: qty 300

## 2016-05-16 MED ORDER — GLUCAGON 1 MG IJ SOLR (WRAP)
1.0000 mg | INTRAMUSCULAR | Status: DC | PRN
Start: 2016-05-16 — End: 2016-05-16

## 2016-05-16 MED ORDER — AMLODIPINE BESYLATE 5 MG PO TABS
5.0000 mg | ORAL_TABLET | Freq: Every day | ORAL | Status: DC
Start: 2016-05-16 — End: 2016-05-16
  Administered 2016-05-16: 5 mg via ORAL
  Filled 2016-05-16: qty 1

## 2016-05-16 MED ORDER — INSULIN LISPRO 100 UNIT/ML SC SOLN
1.0000 [IU] | Freq: Every evening | SUBCUTANEOUS | Status: DC
Start: 2016-05-16 — End: 2016-05-19
  Administered 2016-05-16: 1 [IU] via SUBCUTANEOUS
  Administered 2016-05-17: 2 [IU] via SUBCUTANEOUS
  Administered 2016-05-19: 1 [IU] via SUBCUTANEOUS
  Filled 2016-05-16: qty 3
  Filled 2016-05-16: qty 6
  Filled 2016-05-16: qty 3

## 2016-05-16 MED ORDER — AMLODIPINE BESYLATE 5 MG PO TABS
5.0000 mg | ORAL_TABLET | Freq: Once | ORAL | Status: AC
Start: 2016-05-16 — End: 2016-05-16
  Administered 2016-05-16: 5 mg via ORAL
  Filled 2016-05-16: qty 1

## 2016-05-16 MED ORDER — ALBUTEROL SULFATE (2.5 MG/3ML) 0.083% IN NEBU
2.5000 mg | INHALATION_SOLUTION | Freq: Once | RESPIRATORY_TRACT | Status: AC
Start: 2016-05-16 — End: 2016-05-16
  Administered 2016-05-16: 2.5 mg via RESPIRATORY_TRACT
  Filled 2016-05-16: qty 3

## 2016-05-16 MED ORDER — LORAZEPAM 2 MG/ML IJ SOLN
INTRAMUSCULAR | Status: AC
Start: 2016-05-16 — End: 2016-05-16
  Administered 2016-05-16: 1 mg via INTRAVENOUS
  Filled 2016-05-16: qty 1

## 2016-05-16 MED ORDER — INSULIN LISPRO 100 UNIT/ML SC SOLN
1.0000 [IU] | Freq: Three times a day (TID) | SUBCUTANEOUS | Status: DC | PRN
Start: 2016-05-16 — End: 2016-05-16

## 2016-05-16 MED ORDER — GLUCAGON 1 MG IJ SOLR (WRAP)
1.0000 mg | INTRAMUSCULAR | Status: DC | PRN
Start: 2016-05-16 — End: 2016-05-19

## 2016-05-16 MED ORDER — NITROGLYCERIN IN D5W 200-5 MCG/ML-% IV SOLN
INTRAVENOUS | Status: AC
Start: 2016-05-16 — End: 2016-05-16
  Administered 2016-05-16: 50 ug/min via INTRAVENOUS
  Filled 2016-05-16: qty 250

## 2016-05-16 MED ORDER — LABETALOL HCL 5 MG/ML IV SOLN
20.0000 mg | Freq: Once | INTRAVENOUS | Status: AC
Start: 2016-05-16 — End: 2016-05-16
  Administered 2016-05-16: 20 mg via INTRAVENOUS
  Filled 2016-05-16: qty 4

## 2016-05-16 MED ORDER — IPRATROPIUM BROMIDE 0.02 % IN SOLN
0.5000 mg | Freq: Once | RESPIRATORY_TRACT | Status: AC
Start: 2016-05-16 — End: 2016-05-16
  Administered 2016-05-16: 0.5 mg via RESPIRATORY_TRACT
  Filled 2016-05-16: qty 2.5

## 2016-05-16 MED ORDER — FAMOTIDINE 20 MG PO TABS
20.0000 mg | ORAL_TABLET | Freq: Every day | ORAL | Status: DC
Start: 2016-05-16 — End: 2016-05-19
  Administered 2016-05-16 – 2016-05-19 (×4): 20 mg via ORAL
  Filled 2016-05-16 (×4): qty 1

## 2016-05-16 MED ORDER — INSULIN LISPRO 100 UNIT/ML SC SOLN
2.0000 [IU] | Freq: Three times a day (TID) | SUBCUTANEOUS | Status: DC
Start: 2016-05-16 — End: 2016-05-19
  Administered 2016-05-16 – 2016-05-18 (×4): 2 [IU] via SUBCUTANEOUS
  Administered 2016-05-19: 4 [IU] via SUBCUTANEOUS
  Filled 2016-05-16 (×3): qty 6
  Filled 2016-05-16: qty 12
  Filled 2016-05-16: qty 6

## 2016-05-16 MED ORDER — CARVEDILOL 12.5 MG PO TABS
12.5000 mg | ORAL_TABLET | Freq: Two times a day (BID) | ORAL | Status: DC
Start: 2016-05-16 — End: 2016-05-16
  Administered 2016-05-16: 12.5 mg via ORAL
  Filled 2016-05-16: qty 1

## 2016-05-16 NOTE — H&P (Addendum)
ADMISSION HISTORY AND PHYSICAL EXAM    Date Time: 05/16/16 10:51 AM  Patient Name: Jacqueline Weber  Attending Physician: Nino Parsley, MD    Assessment:   65 year old female with hypertension, diabetes, prior CVA, presenting with hypertensive urgency and pulmonary edema.  She also has worsening renal function, rule out acute on chronic renal failure, and anemia likely of chronic disease.    Plan:   Blood pressure control, diuresis, ICU management.  Nephrology consultation.  Check iron studies, echocardiogram.  Cardiac enzymes .  See orders.  Consultant Dr. Esaw Dace for nephrology.      Spent on evaluation, diagnosis and treatment discussion with family and other physicians, 2 hours    History of Present Illness:   Jacqueline Weber is a 65 y.o. female originally from [place] who has a history of hypertension and a prior cerebrovascular accident.  The patient presented to the emergency room with shortness of breath, intermittent chest pain and bilateral lower extremity swelling for 1 week.  She has significant shortness of breath on the evening of admission and presented to the emergency room where she was found to have severe hypertension to 45/113 with a pulse of 125, and oxygen saturation 91 percent.  The patient was started on intravenous nitroglycerin and given oxygen supplementation and IV labetalol with improvement in her symptoms.  She is admitted to intensive care unit.  At the time of our exam.  She denies acute chest pain and her breathing is more comfortable, still with mild shortness of breath.  She denies fever, cough or congestion.  There has been wheezing.  She was hospitalized in January 2017 for atypical chest pain, negative cardiac enzymes have hypertension.  Norvasc was added at that time.      By history the patient has been compliant with medications but has stopped taking her Norvasc.    Past Medical History:     Past Medical History:   Diagnosis Date   . Cataracts, bilateral    .  Cerebrovascular accident    . CVA (cerebral infarction) 03/2013   . Diabetes mellitus    . Hypercholesteremia    . Hypertension    CVA 2014, with mild left hemiparesis, improved.  Atypical chest pain      Past Surgical History:   History reviewed. No pertinent surgical history.    Family History:     Family History   Problem Relation Age of Onset   . No known problems Mother    . No known problems Father        Social History:     Social History     Social History   . Marital status: Widowed     Spouse name: N/A   . Number of children: N/A   . Years of education: N/A     Social History Main Topics   . Smoking status: Never Smoker   . Smokeless tobacco: Never Used   . Alcohol use No   . Drug use: No   . Sexual activity: Not on file     Other Topics Concern   . Not on file     Social History Narrative   . No narrative on file       Allergies:     Allergies   Allergen Reactions   . Mosquito (Culex Pipiens) Allergy Skin Test Shortness Of Breath     And  Trouble  breathing   . Lisinopril    . Penicillins    . Shellfish-Derived Products Hives  New  allergy       Medications:     Prescriptions Prior to Admission   Medication Sig   . amLODIPine (NORVASC) 5 MG tablet Take 1 tablet (5 mg total) by mouth daily. (Patient taking differently: Take 10 mg by mouth daily.   )   . aspirin EC 325 MG EC tablet Take 1 tablet (325 mg total) by mouth daily.   . carvedilol (COREG) 25 MG tablet Take 25 mg by mouth 2 (two) times daily with meals.      . cloNIDine (CATAPRES) 0.1 MG tablet Take 0.1 mg by mouth 2 (two) times daily.   . famotidine (PEPCID) 20 MG tablet Take 1 tablet (20 mg total) by mouth daily.   . fluticasone (FLONASE) 50 MCG/ACT nasal spray 1 spray by Nasal route daily.   . hydrALAZINE (APRESOLINE) 50 MG tablet Take 50 mg by mouth 2 (two) times daily.   . metFORMIN (GLUCOPHAGE) 1000 MG tablet Take 1 tablet (1,000 mg total) by mouth 2 (two) times daily with meals.   . simvastatin (ZOCOR) 10 MG tablet Take 10 mg by mouth  nightly.   . vitamin D, ergocalciferol, (DRISDOL) 50000 UNIT Cap Take 2,000 Units by mouth 2 (two) times daily.     Scheduled Meds:  Current Facility-Administered Medications   Medication Dose Route Frequency   . amLODIPine  5 mg Oral Daily   . cloNIDine  0.1 mg Oral BID   . famotidine  20 mg Oral Daily   . heparin (porcine)  5,000 Units Subcutaneous Q12H [facility]   . simvastatin  10 mg Oral QHS     Continuous Infusions:  . nitroglycerin 200 mcg/min (05/16/16 0959)     PRN Meds:.    Review of Systems:   A comprehensive review of systems was: Unable to assess in detail due to respiratory distress.  Denies acute chest pain.  Has had chest pain in the past.  No vomiting, no diarrhea, no abdominal pain, no melena, no cough.  Positive shortness of breath.  No history of seizures.  Mild left hemiparesis following CVA    Physical Exam:     Vitals:    05/16/16 1000   BP:    Pulse:    Resp:    Temp: 98.1 F (36.7 C)   SpO2:    Initial blood pressure 235/113, heart rate 125, respiratory rate 28, oxygen saturations 91 percent  Percent blood pressure in intensive care unit 190/91.  Oxygen saturations 100 percent on nasal cannula oxygen.    Intake and Output Summary (Last 24 hours) at Date Time    Intake/Output Summary (Last 24 hours) at 05/16/16 1051  Last data filed at 05/16/16 1000   Gross per 24 hour   Intake              251 ml   Output              500 ml   Net             -249 ml       General appearance - alert, Mild dyspnea, mild respiratory distress  Pupils equal and reactive, extraocular eye movements intact, sclera anicteric  Ears - bilateral TM's and external ear canals normal  Nose - normal and patent, no erythema, discharge or polyps  Mouth - mucous membranes moist, pharynx normal without lesions  Neck - supple, no significant adenopathy, no  Jugular venous distension  Lymphatics - no palpable lymphadenopathy, no hepatosplenomegaly  Chest -  clear to auscultation, no wheezes, rales or rhonchi, symmetric air  entry  Heart - tachycardic  rate, regular rhythm, normal S1, S2, no murmurs, rubs, clicks or gallops  Abdomen - soft, nontender, nondistended, no masses or organomegaly  Rectal - deferred, not clinically indicated  Back exam - full range of motion, no tenderness, palpable spasm or pain on motion  Neurological - alert, responsive, limited English, follows simple commands, slight left nasolabial fold droop.  Moves all extremities.  Gait not tested.  Grossly intact.  Sensory exam intact, extra ocular movements intact  musculoskeletal - no joint tenderness, deformity or swelling  Extremities - peripheral pulses normal,  +1 leg andal edema, no clubbing or cyanosis  Skin - normal coloration and turgor, no rashes, no suspicious skin lesions noted    Labs:   Recent CBC   Recent Labs  Lab 05/16/16  0522   WBC 11.85*   RBC 3.61*   Hgb 7.4*   Hematocrit 24.0*   MCV 66.5*   Platelets 262         Recent Labs  Lab 05/16/16  0522   Sodium 134*   Potassium 4.6   Chloride 106   CO2 19*   Glucose 195*   BUN 38.2*   Creatinine 2.1*   AST (SGOT) 20   ALT 15   Alkaline Phosphatase 68   B-Natriuretic Peptide 296.6*   Troponin I 0.05       Rads:     Radiology Results (24 Hour)     Procedure Component Value Units Date/Time    Chest AP Portable [[id number]] Collected:  05/16/16 0544    Order Status:  Completed Updated:  05/16/16 0551    Narrative:       HISTORY: Shortness of breath    Portable image of the chest shows enlarged cardiac pericardial  silhouette. Vascular pattern is increased. Kerley B lines are present.  Aortic contour is slightly tortuous. There is no focal consolidation,  pleural effusion or pneumothorax.      Impression:         1. Congestive failure. This is new when compared to 09/30/2015.    Dierdre Searles, MD   05/16/2016 5:47 AM        EKG sinus rhythm, poor R-wave progression, asymmetrical T-wave inversion in inferior lateral leads    urinalysis specific gravity 1.006, 100 protein, 50 glucose.  0-l 5 red cells and white  cells       Signed by: Nino Parsley, MD

## 2016-05-16 NOTE — ED Provider Notes (Signed)
Physician/Midlevel provider first contact with patient: 05/16/16 0507         History     Chief Complaint   Patient presents with   . Shortness of Breath   . Chest Pain     Patient presents with the shortness of breath and chest pain that became worse this evening.  Chest pain and short of breath have been going on for about a week or 2 with associated leg swelling.  This evening Shortness of breath and chest pain that became worse, nonradiating chest pain, constant, gradually worsening, nothing makes it better, worse with exertion. Denies f/c/n/v/d/c//headahce/photophobia/rash/focal neuro deficits/loc/neck pain/trauma        The history is provided by the patient and a relative. The history is limited by a language barrier. A language interpreter was used (Patient wanted her daughter translates).   Shortness of Breath   Associated symptoms: chest pain and wheezing    Associated symptoms: no abdominal pain, no cough, no fever, no headaches, no neck pain, no sore throat and no vomiting    Chest Pain   Associated symptoms: shortness of breath    Associated symptoms: no abdominal pain, no back pain, no cough, no dizziness, no fever, no headache, no nausea, no numbness, no palpitations, no vomiting and no weakness             Past Medical History:   Diagnosis Date   . Cataracts, bilateral    . Cerebrovascular accident    . CVA (cerebral infarction) 03/2013   . Diabetes mellitus    . Hypercholesteremia    . Hypertension        History reviewed. No pertinent surgical history.    Family History   Problem Relation Age of Onset   . No known problems Mother    . No known problems Father        Social  Social History   Substance Use Topics   . Smoking status: Never Smoker   . Smokeless tobacco: Never Used   . Alcohol use No       .     Allergies   Allergen Reactions   . Mosquito (Culex Pipiens) Allergy Skin Test Shortness Of Breath     And  Trouble  breathing   . Lisinopril    . Penicillins    . Shellfish-Derived Products  Hives     New  allergy       Home Medications     Med List Status:  In Progress Set By: Assunta Found, RN at 05/16/2016  5:01 AM                amLODIPine (NORVASC) 5 MG tablet     Take 1 tablet (5 mg total) by mouth daily.     Patient taking differently:  Take 10 mg by mouth daily.        aspirin EC 325 MG EC tablet     Take 1 tablet (325 mg total) by mouth daily.     carvedilol (COREG) 25 MG tablet     Take 25 mg by mouth 2 (two) times daily with meals.        cloNIDine (CATAPRES) 0.1 MG tablet     Take 0.1 mg by mouth 2 (two) times daily.     famotidine (PEPCID) 20 MG tablet     Take 1 tablet (20 mg total) by mouth daily.     fluticasone (FLONASE) 50 MCG/ACT nasal spray     1  spray by Nasal route daily.     glimepiride (AMARYL) 4 MG tablet     Take 1 tablet (4 mg total) by mouth every morning with breakfast.     Patient taking differently:  Take 2 mg by mouth every morning with breakfast.        meclizine (ANTIVERT) 25 MG tablet     Take 1 tablet (25 mg total) by mouth 3 (three) times daily.     metFORMIN (GLUCOPHAGE) 1000 MG tablet     Take 1 tablet (1,000 mg total) by mouth 2 (two) times daily with meals.     simvastatin (ZOCOR) 10 MG tablet     Take 10 mg by mouth nightly.           Review of Systems   Constitutional: Negative for chills and fever.   HENT: Negative for rhinorrhea and sore throat.    Eyes: Negative for photophobia and discharge.   Respiratory: Positive for shortness of breath and wheezing. Negative for cough.    Cardiovascular: Positive for chest pain. Negative for palpitations.   Gastrointestinal: Negative for abdominal pain, diarrhea, nausea and vomiting.   Genitourinary: Negative for dysuria, frequency and hematuria.   Musculoskeletal: Negative for back pain, myalgias and neck pain.   Neurological: Negative for dizziness, syncope, weakness, light-headedness, numbness and headaches.   Psychiatric/Behavioral: Negative for confusion and suicidal ideas.       Physical Exam    BP: (!) 245/113,  Heart Rate: (!) 125, Temp: 97 F (36.1 C), Resp Rate: (!) 28, SpO2: 91 %, Weight: 75 kg    Physical Exam   Constitutional: She is oriented to person, place, and time. She appears well-developed and well-nourished.  Non-toxic appearance. She appears distressed.   HENT:   Head: Normocephalic and atraumatic.   Mouth/Throat: Oropharynx is clear and moist. No oropharyngeal exudate.   Eyes: Conjunctivae, EOM and lids are normal. Pupils are equal, round, and reactive to light. Right eye exhibits no discharge and no exudate. Left eye exhibits no discharge and no exudate. Right conjunctiva is not injected. Left conjunctiva is not injected.   Neck: Normal range of motion and full passive range of motion without pain. Neck supple. No JVD present. No tracheal tenderness, no spinous process tenderness and no muscular tenderness present. Carotid bruit is not present. Normal range of motion present.   Cardiovascular: Regular rhythm, normal heart sounds, intact distal pulses and normal pulses.    Hypertension, tachycardia   Pulmonary/Chest: No stridor. She is in respiratory distress. She has no decreased breath sounds. She has wheezes. She has rales. She exhibits no tenderness.   Patient tachypneic and using accessory muscles   Abdominal: Soft. Bowel sounds are normal. She exhibits no distension and no mass. There is no tenderness. There is no rebound and no guarding.   Musculoskeletal: Normal range of motion. She exhibits edema (one plus pedal edema bilaterally). She exhibits no tenderness.   No calf tenderness, no Homman's sign   Neurological: She is alert and oriented to person, place, and time. She has normal strength and normal reflexes. No cranial nerve deficit or sensory deficit. Coordination normal. GCS eye subscore is 4. GCS verbal subscore is 5. GCS motor subscore is 6.   Reflex Scores:       Patellar reflexes are 2+ on the right side and 2+ on the left side.  Skin: Skin is warm. No rash noted. She is not diaphoretic. No  pallor.   Psychiatric: She has a normal mood and  affect. Her speech is normal and behavior is normal. Judgment and thought content normal. Cognition and memory are normal.   Nursing note and vitals reviewed.        MDM and ED Course     ED Medication Orders     Start Ordered     Status Ordering Provider    05/16/16 0516 05/16/16 0515  albuterol (PROVENTIL) nebulizer solution 2.5 mg  RT - Once     Route: Nebulization  Ordered Dose: 2.5 mg     Last MAR action:  Given Melda Mermelstein, [facility]    05/16/16 0516 05/16/16 0515  ipratropium (ATROVENT) 0.02 % nebulizer solution 0.5 mg  RT - Once     Route: Nebulization  Ordered Dose: 0.5 mg     Last MAR action:  Given Alejandro Mulling    05/16/16 0516 05/16/16 0515  nitroglycerin (TRIDIL) 50 mg in dextrose 5% 250 mL infusion (premix)  Continuous     Comments:  Start at 50 mcg/min   Route: Intravenous  Ordered Dose: 20-100 mcg/min     Last [facility] action:  [employer], [facility]    05/16/16 0516 05/16/16 0515  furosemide (LASIX) injection 40 mg  Once     Route: Intravenous  Ordered Dose: 40 mg     Last MAR action:  Given Nehemiah Mcfarren             MDM  Number of Diagnoses or Management Options  Acute kidney injury:   Acute pulmonary edema:   Anemia due to other cause:   Hypertensive emergency:   Respiratory distress:   Diagnosis management comments: I, Alejandro Mulling, have assumed care of [place].  I have completed her evaluation, reviewed all pertinent data, and determined her final disposition.    " *This note was generated by the Epic EMR system/ Dragon speech recognition and may contain inherent errors or omissions not intended by the user. Grammatical errors, random word insertions, deletions, pronoun errors and incomplete sentences are occasional consequences of this technology due to software limitations. Not all errors are caught or corrected. If there are questions or concerns about the content of this note or information contained within the body of this dictation they should be  addressed directly with the author for clarification."      Oxygen saturation by pulse oximetry is 91%-94%, Low Normal.  Interventions: Oxygen Administration and Inhaler.     I reviewed all labs and/or radiology studies.     I reviewed nursing recorded vitals and history including PMSFHX         CRITICAL CARE    Time: 45 total number of minutes spent in direct and indirect care of this critically ill patient excluding procedure time.    cxr shows pulmonary vascular congestion-  Read by Dr. Sula Rumple.  X-ray reading was used in medical decision-making.    Differential diagnoses: ACS, congestive heart failure,/pulmonary edema, hypertensive emergency, pneumonia, bronchitis    Labs, medications, EKG, chest x-ray      EKG Interpretation  EKG interpreted by ED physician    Rate: Tachycardic  Rhythm: sinus tachycardia  Axis: Normal  ST Segments: Non-specific ST segment change(s)  T waves: Non-specific T Wave change(s  Conduction: No blocks  Impression: Abnormal EKG    Alejandro Mulling, MD    Patient with hypertensive emergency.  It took 200 mcg/min of nitroglycerin and 2 doses of 20 mg labetalol to bring it under control.    WBC 11.85.  Hemoglobin 7.4, which is chronic for  patient.  Patient likely also hemodiluted.  Patient with acute kidney injury with a creatinine of 2.1.    Patient feeling better on BiPAP.  Discussed case with Dr. Bonnye Fava admitted patient to ICU.    Discussed study results and treatment plan with patient and family                        ED Course              Procedures    Clinical Impression & Disposition     Clinical Impression  Final diagnoses:   Hypertensive emergency   Acute kidney injury   Acute pulmonary edema   Anemia due to other cause   Respiratory distress        ED Disposition     ED Disposition Condition Date/Time Comment    Admit  Tue May 16, 2016  7:14 AM Admitting Physician: Dolores Lory I [[id number]]   Diagnosis: Hypertensive emergency [phone]   Estimated Length of Stay: > or = to 2  midnights   Tentative Discharge Plan?: Home or Self Care [1]   Patient Class: Inpatient [101]             New Prescriptions    No medications on file                 Alejandro Mulling, MD  05/16/16 [phone]

## 2016-05-16 NOTE — Consults (Signed)
[facility]    Date Time: 05/16/16 1:08 PM  Patient Name: Jacqueline Weber  Requesting Physician: Nino Parsley, MD       Reason for Consultation:   HTN crisis with + troponin      History:   Jacqueline Weber is a 65 y.o. female admitted on 05/16/2016, for whom we are asked to provide cardiac consultation by Dr Truman Hayward, regarding HTN crisis with + troponin     Pt without prior h/o CAD but + RF of DM, HTN, hyperlipidemia.  Also prior history of CVA.  Pt was seen recently in our office 05/10/16 with complaints of CP, she had a prior abnormal MPI earlier this year showing inferolateral ischemia.  She was actually scheduled for [facility] 05/18/16, however pre procedure lab work revealed a creat of 1.9 (prior 1.0 in January) so the cath was cancelled pending a nephrology evaluation.  In the mean time, the patient developed worsening SOB and chest pressure, first with ambulaiton then worsening this weekend to more persistent symptoms.  She complained of orthopnea and PND, BLE edema.  She presented to the ER with severe SOB and found to be profoundly hypertensive at 245/113.  She had ST with ST depression in lateral leads.  Initial troponin 0.05 which then elevated to 8.03.  Pt is currently feeling improvement, states she still has a little pressure on the chest but much improved.  Awaiting repeat EKG        Relevant cardiac history diabetes, hypertension, hyperlipidemia and history of prior stroke    Past Medical History:     Past Medical History:   Diagnosis Date   . Cataracts, bilateral    . Cerebrovascular accident    . CVA (cerebral infarction) 03/2013   . Diabetes mellitus    . Hypercholesteremia    . Hypertension        Past Surgical History:   History reviewed. No pertinent surgical history.    Family History:     Family History   Problem Relation Age of Onset   . No known problems Mother    . No known problems Father        Social History:     Social History     Social  History   . Marital status: Widowed     Spouse name: N/A   . Number of children: N/A   . Years of education: N/A     Social History Main Topics   . Smoking status: Never Smoker   . Smokeless tobacco: Never Used   . Alcohol use No   . Drug use: No   . Sexual activity: Not on file     Other Topics Concern   . Not on file     Social History Narrative   . No narrative on file       Allergies:     Allergies   Allergen Reactions   . Mosquito (Culex Pipiens) Allergy Skin Test Shortness Of Breath     And  Trouble  breathing   . Lisinopril    . Penicillins    . Shellfish-Derived Products Hives     New  allergy       Medications:     Prescriptions Prior to Admission   Medication Sig   . amLODIPine (NORVASC) 5 MG tablet Take 1 tablet (5 mg total) by mouth daily. (Patient taking differently: Take 10 mg by mouth daily.   )   . aspirin EC  325 MG EC tablet Take 1 tablet (325 mg total) by mouth daily.   . carvedilol (COREG) 25 MG tablet Take 25 mg by mouth 2 (two) times daily with meals.      . cloNIDine (CATAPRES) 0.1 MG tablet Take 0.1 mg by mouth 2 (two) times daily.   . famotidine (PEPCID) 20 MG tablet Take 1 tablet (20 mg total) by mouth daily.   . fluticasone (FLONASE) 50 MCG/ACT nasal spray 1 spray by Nasal route daily.   . hydrALAZINE (APRESOLINE) 50 MG tablet Take 50 mg by mouth 2 (two) times daily.   . metFORMIN (GLUCOPHAGE) 1000 MG tablet Take 1 tablet (1,000 mg total) by mouth 2 (two) times daily with meals.   . simvastatin (ZOCOR) 10 MG tablet Take 10 mg by mouth nightly.   . vitamin D, ergocalciferol, (DRISDOL) 50000 UNIT Cap Take 2,000 Units by mouth 2 (two) times daily.       Current Facility-Administered Medications   Medication Dose Route Frequency Provider Last Rate Last Dose   . [START ON 05/17/2016] amLODIPine (NORVASC) tablet 10 mg  10 mg Oral Daily Abner Greenspan K, MD       . cloNIDine (CATAPRES) tablet 0.1 mg  0.1 mg Oral BID Mendiguren, Ignacio I, MD   0.1 mg at 05/16/16 0928   . dextrose (GLUCOSE) 40 % oral  gel 15 g of glucose  15 g of glucose Oral PRN Barbaraann Rondo, MD        And   . dextrose (D10W) 10% bolus 125 mL  125 mL Intravenous PRN Barbaraann Rondo, MD        And   . glucagon (rDNA) (GLUCAGEN) injection 1 mg  1 mg Intramuscular PRN Barbaraann Rondo, MD       . famotidine (PEPCID) tablet 20 mg  20 mg Oral Daily Mendiguren, Ignacio I, MD   20 mg at 05/16/16 2993   . heparin (porcine) injection 5,000 Units  5,000 Units Subcutaneous Q12H [facility] Mendiguren, Minerva Areola I, MD   5,000 Units at 05/16/16 7169   . hydrALAZINE (APRESOLINE) tablet 100 mg  100 mg Oral BID Barbaraann Rondo, MD   100 mg at 05/16/16 1247   . insulin lispro (HumaLOG) injection 1-6 Units  1-6 Units Subcutaneous QHS Abner Greenspan K, MD       . insulin lispro (HumaLOG) injection 2-10 Units  2-10 Units Subcutaneous TID [facility] Barbaraann Rondo, MD   2 Units at 05/16/16 1246   . nitroglycerin (TRIDIL) 50 mg in dextrose 5% 250 mL infusion (premix)  200 mcg/min Intravenous Continuous Alejandro Mulling, MD 39 mL/hr at 05/16/16 1258 130 mcg/min at 05/16/16 1258   . simvastatin (ZOCOR) tablet 10 mg  10 mg Oral QHS Mendiguren, Ignacio I, MD             Review of Systems:    Comprehensive review of systems including constitutional, eyes, ears, nose, mouth, throat, cardiovascular, GI, GU, musculoskeletal, integumentary, respiratory, neurologic, psychiatric, and endocrine is negative other than what is mentioned already in the history of present illness    Physical Exam:     Vitals:    05/16/16 1200   BP: 151/61   Pulse: 77   Resp: 17   Temp:    SpO2: 100%     Temp (24hrs), Avg:97.7 F (36.5 C), Min:97 F (36.1 C), Max:98.1 F (36.7 C)      Telemetry/EKG  ST 116 bpm with lateral ST depression of 1.5 mm V4-V6  Intake and Output Summary (Last 24 hours) at Date Time    Intake/Output Summary (Last 24 hours) at 05/16/16 1308  Last data filed at 05/16/16 1258   Gross per 24 hour   Intake              643 ml   Output             1150 ml   Net             -507 ml     GENERAL: Patient is in no  acute distress   HEENT: No scleral icterus or conjunctival pallor, moist mucous membranes   NECK: No jugular venous distention or thyromegaly, normal carotid upstrokes without bruits   CARDIAC: Normal apical impulse, regular rate and rhythm, with normal S1 and S2, and no murmurs, rubs, or gallops   CHEST: Crackles bilaterally, normal respiratory effort  ABDOMEN: No abdominal bruits, masses, or hepatosplenomegaly, nontender, non-distended, good bowel sounds   EXTREMITIES: No clubbing, cyanosis, or edema, 2+ DP, PT, and femoral pulses bilaterally without bruits  SKIN: No rash or jaundice   NEUROLOGIC: Alert and oriented to time, place and person, normal mood and affect  MUSCULOSKELETAL: Normal muscle strength and tone.      Labs Reviewed:       Recent Labs  Lab 05/16/16  1221 05/16/16  0522   Troponin I 8.03* 0.05           Recent Labs  Lab 05/16/16  0522   Bilirubin, Total 0.1*   Protein, Total 5.8*   Albumin 2.6*   ALT 15   AST (SGOT) 20       Recent Labs  Lab 05/16/16  0522   WBC 11.85*   Hgb 7.4*   Hematocrit 24.0*   Platelets 262       Recent Labs  Lab 05/16/16  0522   Sodium 134*   Potassium 4.6   Chloride 106   CO2 19*   BUN 38.2*   Creatinine 2.1*   EGFR 23.7   Glucose 195*   Calcium 8.8     CrCl cannot be calculated (Unknown ideal weight.).      Radiology     Chest Ap Portable    Result Date: 05/16/2016  1. Congestive failure. This is new when compared to 09/30/2015. Dierdre Searles, MD 05/16/2016 5:47 AM         Assessment:    Hypertensive crisis, CHF, chest pain   Troponin up to 8.03   Recent outpatient Chest pain with abnormal nuclear stress test findings, February 2017, nuclear stress test, small-sized, mild intensity inferoapical ischemia, EF 72%.   Pt set up for outpatient LHC but cancelled due to elevated creat. (1.9 as outpatient, 2.1 inpatient)   February 2017, echo, EF 70%, concentric LVH, grade I diastolic dysfunction,    mild AI.   Hypertension   Hyperlipidemia, 09/2015, TC 148, TRIG 74, HDL 49,  LDL 84 (all at goal).    Recommendations:    Pt was scheduled for an outpatient LHC due to CP and abnormal nuclear ST showing inferoapical ischemia.  LHC was cancelled due to pre procedure lab work showing creat of 1.9.  She was to see nephrology prior to cath.  EKG with lateral ST depression, and now with trop of 8.  Pt WILL need LHC prior to discharge, needs to be medically optimized prior to.     Initiate coreg 12.5 BID and titrate to 25 BID if HR will tolerate.  Would like to wean off clonidine if able once BP under better control   Continue with hydralazine, and norvasc.  Continue clonidine for now, titrate NTG as able.     IV lasix for CHF (recieved 40 mg IV x 2 today)        Signed by: Lorelee Cover, PA-C      [place]  NP [place] (8am-5pm)  MD Spectralink [phone] (8am-5pm)  After hours, non urgent consult line [phone]  After Hours, urgent consults [phone]

## 2016-05-16 NOTE — Consults (Signed)
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 WIll increase the dose to 25 BID so that his NTG drip can be weaned off. Avoid diuretics and ACE inhibitors for now.  Diuretics only if increased O2 requirements. Will wait for AKI to resolve.   Ordered UP/C and renal USG, iron studies   Try to obtain records from PCP   Daily labs   [facility] input. Once Cr stabaliizes and we know the baseline then can proceed with cardiac cath   Plan d/w ICU and cardiology team    We will follow this patient closely with you. Thank you for allowing [place] to participate in the care of this patient.    Dayna Barker, MD  Office - [phone]  Spectra Link - [phone]    History:   Jacqueline Weber is a 65 y.o. female with PMH of DM, HTN poorly controlled, HLD +ve nuclrea stress test 05/10/16 admitted with HTN emergency , CP, SOB. Found to have acute mild pulm edema and trop 8 from<1. Nephrology consulted for management of AKI on CKD. Her baseline Cr 1/17 was 1.0 and 1.9 in 9/17. On arrival her was 2.1  She has seen a nephrologist  For CKD in the last few months.  She has H/O HTN for years and her BP has been in 397Q systolic range in the last few months. She has LE edema for the last few months. Denies any rash, swelling of joints, hematuria. No NSAIDS recently.    In the hospital she in NTG drip and started on amlodipine, clonidine and hydralazine. Her UA show protein 100, RBC 0-2, WBC 0-5    Past Medical and Surgical  History:      Past Medical History:   Diagnosis Date   . Cataracts, bilateral    . Cerebrovascular accident    . CVA (cerebral infarction) 03/2013   . Diabetes mellitus    . Hypercholesteremia    . Hypertension      History reviewed. No pertinent surgical history.  Family History:     Family History   Problem Relation Age of Onset   . No known problems Mother    . No known problems Father      Social History:     Social History     Social History   . Marital status: Widowed     Spouse name: N/A   . Number of children: N/A   . Years of education: N/A     Occupational History   . Not on file.     Social History Main Topics   . Smoking status: Never Smoker   . Smokeless tobacco: Never Used   . Alcohol use No   . Drug use: No   . Sexual activity: Not on file     Other Topics Concern   . Not on file     Social History Narrative   .  No narrative on file     Allergies:     Allergies   Allergen Reactions   . Mosquito (Culex Pipiens) Allergy Skin Test Shortness Of Breath     And  Trouble  breathing   . Lisinopril    . Penicillins    . Shellfish-Derived Products Hives     New  allergy     Medications:     Current Facility-Administered Medications   Medication Dose Route Frequency   . [START ON 05/17/2016] amLODIPine  10 mg Oral Daily   . carvedilol  12.5 mg Oral Q12H [facility]   . cloNIDine  0.1 mg Oral BID   . famotidine  20 mg Oral Daily   . heparin (porcine)  5,000 Units Subcutaneous Q12H [facility]   . hydrALAZINE  100 mg Oral BID   . insulin lispro  1-6 Units Subcutaneous QHS   . insulin lispro  2-10 Units Subcutaneous TID AC   . magnesium sulfate  1 g Intravenous Q1H   . simvastatin  10 mg Oral QHS     Review of Systems:   10 systems were reviewed and were negative  Physical Exam:   Temp:  [97 F (36.1 C)-98.1 F (36.7 C)] 97.9 F (36.6 C)  Heart Rate:  [75-125] 92  Resp Rate:  [16-30] 22  BP: (129-245)/(53-114) 131/59  FiO2:  [40 %] 40 %    Intake and Output Summary (Last 24 hours) at Date Time    Intake/Output Summary (Last 24 hours) at  05/16/16 1614  Last data filed at 05/16/16 1500   Gross per 24 hour   Intake              643 ml   Output             3150 ml   Net            -2507 ml       Gen: WN WD NAD  HEENT: NC/AT, moist MM, clear oropharynx  Neck: No JVD, Supple  CV: S1 S2 N RRR no M/R/G/C,   Chest: Good effort, CTAB, symmetric expansion  Ab: ND NT Soft no HSM +BS  Skin: Dry, intact  Ext: Warm, no cords, 2+ LE edema  Psych: Appropriate mood and affect    Labs:     Recent Labs  Lab 05/16/16  1221 05/16/16  0522   Glucose  --  195*   BUN  --  38.2*   Creatinine  --  2.1*   Calcium  --  8.8   Sodium  --  134*   Potassium  --  4.6   Chloride  --  106   CO2  --  19*   Albumin  --  2.6*   Magnesium 1.4*  --        Recent Labs  Lab 05/16/16  0522   WBC 11.85*   RBC 3.61*   Hgb 7.4*   Hematocrit 24.0*   MCV 66.5*   MCH 20.5*   MCHC 30.8*   RDW 16*   MPV 11.0   Platelets 262     Radiology:   Radiological Procedure reviewed.   EKG:   Reviewed  Prior Records:   I reviewed the old records.

## 2016-05-16 NOTE — Plan of Care (Signed)
Problem: Safety  Goal: Patient will be free from injury during hospitalization   05/16/16 1326   Goal/Interventions addressed this shift   Patient will be free from injury during hospitalization  Assess patient's risk for falls and implement fall prevention plan of care per policy;Provide and maintain safe environment;Ensure appropriate safety devices are available at the bedside;Include patient/ family/ care giver in decisions related to safety;Assess for patients risk for elopement and implement Celebration per policy;Provide alternative method of communication if needed (communication boards, writing)

## 2016-05-16 NOTE — Plan of Care (Addendum)
Problem: Safety  Goal: Patient will be free from injury during hospitalization  Outcome: Progressing   05/16/16 2305   Goal/Interventions addressed this shift   Patient will be free from injury during hospitalization  Assess patient's risk for falls and implement fall prevention plan of care per policy;Provide and maintain safe environment;Ensure appropriate safety devices are available at the bedside;Assess for patients risk for elopement and implement Elopement Risk Plan per policy     Bed alarm on, and side rails up. Instructed patient to call when needing to commode. Call bell at bedside.     Dr. Truman Hayward notified of Troponin peaked-Now trop 7.24 from 8. Also of CK  Instructed to not draw anymore CK

## 2016-05-17 ENCOUNTER — Ambulatory Visit (INDEPENDENT_AMBULATORY_CARE_PROVIDER_SITE_OTHER): Payer: Self-pay

## 2016-05-17 ENCOUNTER — Inpatient Hospital Stay: Payer: Medicare Other

## 2016-05-17 LAB — CBC AND DIFFERENTIAL
Absolute NRBC: 0 10*3/uL
Hematocrit: 22 % — ABNORMAL LOW (ref 37.0–47.0)
Hgb: 7.1 g/dL — ABNORMAL LOW (ref 12.0–16.0)
MCH: 20.9 pg — ABNORMAL LOW (ref 28.0–32.0)
MCHC: 32.3 g/dL (ref 32.0–36.0)
MCV: 64.7 fL — ABNORMAL LOW (ref 80.0–100.0)
MPV: 10.6 fL (ref 9.4–12.3)
Nucleated RBC: 0 /100 WBC (ref 0.0–1.0)
Platelets: 215 10*3/uL (ref 140–400)
RBC: 3.4 10*6/uL — ABNORMAL LOW (ref 4.20–5.40)
RDW: 15 % (ref 12–15)
WBC: 5.48 10*3/uL (ref 3.50–10.80)

## 2016-05-17 LAB — HEPATIC FUNCTION PANEL
ALT: 15 U/L (ref 0–55)
AST (SGOT): 39 U/L — ABNORMAL HIGH (ref 5–34)
Albumin/Globulin Ratio: 0.8 — ABNORMAL LOW (ref 0.9–2.2)
Albumin: 2.1 g/dL — ABNORMAL LOW (ref 3.5–5.0)
Alkaline Phosphatase: 53 U/L (ref 37–106)
Bilirubin Direct: 0.1 mg/dL (ref 0.0–0.5)
Bilirubin Indirect: 0.1 mg/dL (ref 0.0–1.1)
Bilirubin, Total: 0.2 mg/dL (ref 0.2–1.2)
Globulin: 2.5 g/dL (ref 2.0–3.6)
Protein, Total: 4.6 g/dL — ABNORMAL LOW (ref 6.0–8.3)

## 2016-05-17 LAB — MAN DIFF ONLY
Band Neutrophils Absolute: 0.22 10*3/uL (ref 0.00–1.00)
Band Neutrophils: 4 %
Basophils Absolute Manual: 0 10*3/uL (ref 0.00–0.20)
Basophils Manual: 0 %
Eosinophils Absolute Manual: 0.27 10*3/uL (ref 0.00–0.70)
Eosinophils Manual: 5 %
Lymphocytes Absolute Manual: 1.59 10*3/uL (ref 0.50–4.40)
Lymphocytes Manual: 29 %
Monocytes Absolute: 0.49 10*3/uL (ref 0.00–1.20)
Monocytes Manual: 9 %
Neutrophils Absolute Manual: 2.9 10*3/uL (ref 1.80–8.10)
Segmented Neutrophils: 53 %

## 2016-05-17 LAB — CELL MORPHOLOGY
Cell Morphology: ABNORMAL — AB
Cell Morphology: ABNORMAL — AB
Platelet Estimate: NORMAL
Platelet Estimate: NORMAL

## 2016-05-17 LAB — BASIC METABOLIC PANEL
Anion Gap: 8 (ref 5.0–15.0)
BUN: 31.8 mg/dL — ABNORMAL HIGH (ref 7.0–19.0)
CO2: 21 mEq/L — ABNORMAL LOW (ref 22–29)
Calcium: 8.5 mg/dL (ref 8.5–10.5)
Chloride: 105 mEq/L (ref 100–111)
Creatinine: 2 mg/dL — ABNORMAL HIGH (ref 0.6–1.0)
Glucose: 119 mg/dL — ABNORMAL HIGH (ref 70–100)
Potassium: 3.7 mEq/L (ref 3.5–5.1)
Sodium: 134 mEq/L — ABNORMAL LOW (ref 136–145)

## 2016-05-17 LAB — CBC
Absolute NRBC: 0 10*3/uL
Hematocrit: 19.1 % — ABNORMAL LOW (ref 37.0–47.0)
Hgb: 6.2 g/dL — ABNORMAL LOW (ref 12.0–16.0)
MCH: 21.1 pg — ABNORMAL LOW (ref 28.0–32.0)
MCHC: 32.5 g/dL (ref 32.0–36.0)
MCV: 65 fL — ABNORMAL LOW (ref 80.0–100.0)
MPV: 11.1 fL (ref 9.4–12.3)
Nucleated RBC: 0 /100 WBC (ref 0.0–1.0)
Platelets: 185 10*3/uL (ref 140–400)
RBC: 2.94 10*6/uL — ABNORMAL LOW (ref 4.20–5.40)
RDW: 15 % (ref 12–15)
WBC: 6.14 10*3/uL (ref 3.50–10.80)

## 2016-05-17 LAB — HAPTOGLOBIN: Haptoglobin: 225 mg/dL (ref 35–250)

## 2016-05-17 LAB — MAGNESIUM: Magnesium: 1.8 mg/dL (ref 1.6–2.6)

## 2016-05-17 LAB — LACTATE DEHYDROGENASE: LDH: 195 U/L (ref 125–331)

## 2016-05-17 LAB — GLUCOSE WHOLE BLOOD - POCT
Whole Blood Glucose POCT: 145 mg/dL — ABNORMAL HIGH (ref 70–100)
Whole Blood Glucose POCT: 183 mg/dL — ABNORMAL HIGH (ref 70–100)
Whole Blood Glucose POCT: 161 mg/dL — ABNORMAL HIGH (ref 70–100)
Whole Blood Glucose POCT: 205 mg/dL — ABNORMAL HIGH (ref 70–100)

## 2016-05-17 LAB — PT AND APTT
PT INR: 1.1
PT: 14.4 s (ref 12.6–15.0)
PTT: 39 s — ABNORMAL HIGH (ref 23–37)

## 2016-05-17 LAB — RETICULOCYTES
Immature Retic Fract: 18.3 % — ABNORMAL HIGH (ref 3.0–15.9)
Reticulocyte Count Absolute: 0.0524 10*6/uL (ref 0.0210–0.1350)
Reticulocyte Count Automated: 1.8 % (ref 0.5–2.5)
Reticulocyte Hemoglobin: 24.2 pg — ABNORMAL LOW (ref 28.2–36.6)

## 2016-05-17 LAB — ECG 12-LEAD
Atrial Rate: 95 {beats}/min
P Axis: 42 degrees
P-R Interval: 164 ms
Q-T Interval: 356 ms
QRS Duration: 84 ms
QTC Calculation (Bezet): 447 ms
R Axis: 37 degrees
T Axis: 38 degrees
Ventricular Rate: 95 {beats}/min

## 2016-05-17 LAB — TYPE AND SCREEN
AB Screen Gel: NEGATIVE
ABO Rh: O POS

## 2016-05-17 LAB — GFR: EGFR: 25.1

## 2016-05-17 LAB — HEMOGLOBIN AND HEMATOCRIT, BLOOD
Hematocrit: 19 % — ABNORMAL LOW (ref 37.0–47.0)
Hgb: 6 g/dL — ABNORMAL LOW (ref 12.0–16.0)

## 2016-05-17 MED ORDER — LOSARTAN POTASSIUM 25 MG PO TABS
25.0000 mg | ORAL_TABLET | Freq: Every day | ORAL | Status: DC
Start: 2016-05-17 — End: 2016-05-19
  Administered 2016-05-17 – 2016-05-19 (×3): 25 mg via ORAL
  Filled 2016-05-17 (×3): qty 1

## 2016-05-17 MED ORDER — FUROSEMIDE 10 MG/ML IJ SOLN
40.0000 mg | Freq: Once | INTRAMUSCULAR | Status: AC
Start: 2016-05-17 — End: 2016-05-17
  Administered 2016-05-17: 40 mg via INTRAVENOUS
  Filled 2016-05-17: qty 4

## 2016-05-17 NOTE — Consults (Signed)
CONSULTATION    Date Time: 05/17/16 10:58 AM  Patient Name: Jacqueline Weber  Requesting Physician: Nino Parsley, MD      Reason for Consultation:   Evaluation Anemia.    Assessment:   The patient was admitted with uncontrolled HTN, pulmonary edema and she also has CRF. The anemia is severe. The WBC and platelets are normal. The iron studies are normal. At this point the anemia seems related to CRF. I would start Procrit. If she fails to respond to it she will need a bone marrow biopsy to rule out MDS.    Plan:   Procrit 40,000 U SQ weekly. Supportive Care.    History:   Jacqueline Weber is a 65 y.o. female who presents to the hospital on 05/16/2016 with shortness of breath, chest pain and swelling of the legs.     Past Medical History:     Past Medical History:   Diagnosis Date   . Cataracts, bilateral    . Cerebrovascular accident    . CVA (cerebral infarction) 03/2013   . Diabetes mellitus    . Hypercholesteremia    . Hypertension        Past Surgical History:   History reviewed. No pertinent surgical history.    Family History:     Family History   Problem Relation Age of Onset   . No known problems Mother    . No known problems Father        Social History:     Social History     Social History   . Marital status: Widowed     Spouse name: N/A   . Number of children: N/A   . Years of education: N/A     Social History Main Topics   . Smoking status: Never Smoker   . Smokeless tobacco: Never Used   . Alcohol use No   . Drug use: No   . Sexual activity: Not on file     Other Topics Concern   . Not on file     Social History Narrative   . No narrative on file       Allergies:     Allergies   Allergen Reactions   . Mosquito (Culex Pipiens) Allergy Skin Test Shortness Of Breath     And  Trouble  breathing   . Lisinopril    . Penicillins    . Shellfish-Derived Products Hives     New  allergy       Medications:     Current Facility-Administered Medications   Medication Dose Route Frequency   . amLODIPine  10 mg  Oral Daily   . carvedilol  25 mg Oral Q12H SCH   . cloNIDine  0.1 mg Oral BID   . famotidine  20 mg Oral Daily   . heparin (porcine)  5,000 Units Subcutaneous Q12H [facility]   . hydrALAZINE  100 mg Oral BID   . insulin lispro  1-6 Units Subcutaneous QHS   . insulin lispro  2-10 Units Subcutaneous TID AC   . losartan  25 mg Oral Daily   . simvastatin  10 mg Oral QHS       Review of Systems:   A comprehensive review of systems was: Negative except for mild shortness of breath and left hemiparesis.    Physical Exam:     Vitals:    05/17/16 1000   BP: 154/64   Pulse: 91   Resp: 18   Temp:    SpO2:  98%       Intake and Output Summary (Last 24 hours) at Date Time    Intake/Output Summary (Last 24 hours) at 05/17/16 1058  Last data filed at 05/17/16 1000   Gross per 24 hour   Intake           1963.7 ml   Output             5350 ml   Net          -3386.3 ml       General appearance - chronically ill appearing and no acute distress.  Eyes - pupils equal and reactive, extraocular eye movements intact, sclera anicteric  Mouth - mucous membranes moist, pharynx normal without lesions  Neck - supple, no significant adenopathy  Lymphatics - no palpable lymphadenopathy, no hepatosplenomegaly  Chest - clear to auscultation, no wheezes, rales or rhonchi, symmetric air entry  Heart - normal rate, regular rhythm, normal S1, S2, no murmurs, rubs, clicks or gallops  Abdomen - soft, nontender, nondistended, no masses or organomegaly  Neurological - hemiparesis on left, lower  Extremities - peripheral pulses normal, no pedal edema, no clubbing or cyanosis    Labs Reviewed:     Results     Procedure Component Value Units Date/Time    Hepatic function panel (LFT) [[id number]]  (Abnormal) Collected:  05/17/16 0351    Specimen:  Blood Updated:  05/17/16 1024     Bilirubin, Total 0.2 mg/dL      Bilirubin, Direct 0.1 mg/dL      Bilirubin, Indirect 0.1 mg/dL      AST (SGOT) 39 (H) U/L      ALT 15 U/L      Alkaline Phosphatase 53 U/L      Protein, Total  4.6 (L) g/dL      Albumin 2.1 (L) g/dL      Globulin 2.5 g/dL      Albumin/Globulin Ratio 0.8 (L)    Glucose Whole Blood - POCT [[id number]]  (Abnormal) Collected:  05/17/16 0734     Updated:  05/17/16 0745     POCT - Glucose Whole blood 145 (H) mg/dL     Type and Screen [[id number]] Collected:  05/17/16 0458    Specimen:  Blood Updated:  05/17/16 0613     ABO Rh O POS     AB Screen Gel NEG    PT/APTT [[id number]]  (Abnormal) Collected:  05/17/16 0458     Updated:  05/17/16 0517     PT 14.4 sec      PT INR 1.1     PT Anticoag. Given Within 48 hrs. heparin     PTT 39 (H) sec     Hemoglobin and hematocrit, blood [[id number]]  (Abnormal) Collected:  05/17/16 0458    Specimen:  Blood Updated:  05/17/16 0506     Hgb 6.0 (L) g/dL      Hematocrit 19.0 (L) %     Basic Metabolic Panel [[id number]]  (Abnormal) Collected:  05/17/16 0351    Specimen:  Blood Updated:  05/17/16 0428     Glucose 119 (H) mg/dL      BUN 31.8 (H) mg/dL      Creatinine 2.0 (H) mg/dL      Calcium 8.5 mg/dL      Sodium 134 (L) mEq/L      Potassium 3.7 mEq/L      Chloride 105 mEq/L      CO2 21 (L) mEq/L      Anion  Gap 8.0    Magnesium [[id number]] Collected:  05/17/16 0351    Specimen:  Blood Updated:  05/17/16 0428     Magnesium 1.8 mg/dL     GFR [[id number]] Collected:  05/17/16 0351     Updated:  05/17/16 0428     EGFR 25.1    Cell MorpHology [[id number]]  (Abnormal) Collected:  05/17/16 0351     Updated:  05/17/16 0425     Cell Morphology: Abnormal (A)     Platelet Estimate Normal     Microcytic 2+ (A)     Polychromasia Present (A)     Target Cells 1+ (A)     Ovalocytes Present (A)     Burr Cells 1+ (A)    CBC without differential [[id number]]  (Abnormal) Collected:  05/17/16 0351    Specimen:  Blood from Blood Updated:  05/17/16 0425     WBC 6.14 x10 3/uL      Hgb 6.2 (L) g/dL      Hematocrit 19.1 (L) %      Platelets 185 x10 3/uL      RBC 2.94 (L) x10 6/uL      MCV 65.0 (L) fL      MCH 21.1 (L) pg      MCHC 32.5 g/dL      RDW 15 %      MPV 11.1 fL       Nucleated RBC 0.0 /100 WBC      Absolute NRBC 0.00 x10 3/uL     Protein / creatinine ratio, urine [[id number]]  (Abnormal) Collected:  05/16/16 1850    Specimen:  Urine Updated:  05/16/16 2248     Urine Protein Random 167.2 (H) mg/dL      Urine Creatinine, Random 20.9 mg/dL      Urine Protein/Creatinine Ratio 8.0    Glucose Whole Blood - POCT [[id number]]  (Abnormal) Collected:  05/16/16 2158     Updated:  05/16/16 2201     POCT - Glucose Whole blood 163 (H) mg/dL     Troponin I [[id number]]  (Abnormal) Collected:  05/16/16 1850    Specimen:  Blood Updated:  05/16/16 1924     Troponin I 7.24 (HH) ng/mL     Narrative:       ALREADY CALLED, by [email or web address] on 05/16/2016 at 19:24    CREATINE KINASE LEVEL (CK) [[id number]]  (Abnormal) Collected:  05/16/16 1850    Specimen:  Blood Updated:  05/16/16 1919     Creatine Kinase (CK) 235 (H) U/L     Lipid panel [[id number]]  (Abnormal) Collected:  05/16/16 1221    Specimen:  Blood Updated:  05/16/16 1847     Cholesterol 186 mg/dL      Triglycerides 153 (H) mg/dL      HDL 47 mg/dL      LDL Calculated 108 (H) mg/dL      VLDL Cholesterol Cal 31 mg/dL      CHOL/HDL Ratio 4.0    Hemolysis index [[id number]]  (Abnormal) Collected:  05/16/16 1221     Updated:  05/16/16 1847     Hemolysis Index 56 (H)    IRON PROFILE [[id number]]  (Abnormal) Collected:  05/16/16 1221     Updated:  05/16/16 1842     Iron 42 ug/dL      UIBC 178 ug/dL      TIBC 220 (L) ug/dL      Iron Saturation 19 %     Hemolysis index [[id number]]  (Abnormal) Collected:  05/16/16  1221     Updated:  05/16/16 1842     Hemolysis Index 55 (H)    Glucose Whole Blood - POCT [[id number]]  (Abnormal) Collected:  05/16/16 1638     Updated:  05/16/16 1641     POCT - Glucose Whole blood 130 (H) mg/dL     Magnesium [[id number]]  (Abnormal) Collected:  05/16/16 1221    Specimen:  Blood Updated:  05/16/16 1455     Magnesium 1.4 (L) mg/dL     TSH [[id number]] Collected:  05/16/16 1221    Specimen:  Blood Updated:  05/16/16 1317     Thyroid Stimulating  Hormone 1.02 uIU/mL     MRSA culture (If not done in Triage) [[id number]] Collected:  05/16/16 0845    Specimen:  Body Fluid from Nares Updated:  05/16/16 1307    Troponin I [[id number]]  (Abnormal) Collected:  05/16/16 1221    Specimen:  Blood Updated:  05/16/16 1304     Troponin I 8.03 (HH) ng/mL             Recent CBC   Recent Labs      05/17/16   0458  05/17/16   0351   RBC   --   2.94*   Hgb  6.0*  6.2*   Hematocrit  19.0*  19.1*   MCV   --   65.0*   MCH   --   21.1*   MCHC   --   32.5   RDW   --   15   MPV   --   11.1       Rads:   Radiological Procedure reviewed.     Signed by: [place].

## 2016-05-17 NOTE — Plan of Care (Signed)
Problem: Safety  Goal: Patient will be free from injury during hospitalization  Outcome: Progressing   05/17/16 1132   Goal/Interventions addressed this shift   Patient will be free from injury during hospitalization  Assess patient's risk for falls and implement fall prevention plan of care per policy;Provide and maintain safe environment;Use appropriate transfer methods;Hourly rounding

## 2016-05-17 NOTE — Plan of Care (Signed)
Problem: Hemodynamic Status: Cardiac  Goal: Stable vital signs and fluid balance  Outcome: Progressing

## 2016-05-17 NOTE — Progress Notes (Addendum)
[place]    Date Time: 05/17/16 2:34 PM  Patient Name: Jacqueline Weber, Jacqueline Weber           Assessment:      Hypertensive crisis, CHF, chest pain, improved, off IV NTG at this point   Troponin up to 8.03 (peak)   Recent outpatient Chest pain with abnormal nuclear stress test findings, February 2017, nuclear stress test, small-sized, mild intensity inferoapical ischemia, EF 72%  ? Pt set up for outpatient LHC but cancelled due to elevated creat. (1.9 as outpatient, 2.1 inpatient)   February 2017, echo, EF 70%, concentric LVH, grade I diastolic dysfunction,    mild AI.   Hypertension   Hyperlipidemia, 09/2015, TC 148, TRIG 74, HDL 49, LDL 84 (all at goal).    Recommendations:    coreg increased to 25 BID this am, hopefully will be able to wean off clonidine eventually    Continue norvasc 10, hydralazine 100 BID, cozaar 25 (added today)   Awaiting echo report   Eventual LHC when renal function stabilizes, H/H improves (procrit recommended per hem)    Ok to move out of ICU from cardiac standpoint.    Medications:      Scheduled Meds: PRN Meds:      amLODIPine 10 mg Oral Daily   carvedilol 25 mg Oral Q12H SCH   cloNIDine 0.1 mg Oral BID   famotidine 20 mg Oral Daily   heparin (porcine) 5,000 Units Subcutaneous Q12H SCH   hydrALAZINE 100 mg Oral BID   insulin lispro 1-6 Units Subcutaneous QHS   insulin lispro 2-10 Units Subcutaneous TID AC   losartan 25 mg Oral Daily   simvastatin 10 mg Oral QHS       Continuous Infusions:  . nitroglycerin Stopped (05/17/16 0640)      dextrose 15 g of glucose PRN   And     dextrose 125 mL PRN   And     glucagon (rDNA) 1 mg PRN             Subjective:   Denies chest pain, SOB or palpitations.      Physical Exam:     Vitals:    05/17/16 1300   BP: 148/59   Pulse: 82   Resp: 19   Temp:    SpO2: 98%     Temp (24hrs), Avg:98.2 F (36.8 C), Min:97.6 F (36.4 C), Max:98.8 F (37.1 C)    Weight Monitoring 09/30/2015 10/01/2015 10/28/2015 05/16/2016 05/16/2016  05/16/2016 05/17/2016   Height 152.4 cm - 152.4 cm - - 154.9 cm -   Height Method - - Stated - - Estimated -   Weight 72.712 kg 72.893 kg 71.215 kg 75 kg 68.9 kg - 66.7 kg   Weight Method Bed Scale - Stated Estimated Bed Scale - Bed Scale   BMI (calculated) 31.4 kg/m2 - 30.7 kg/m2 - - - -          Telemetry reviewed no changes.  SR     Intake and Output Summary (Last 24 hours) at Date Time    Intake/Output Summary (Last 24 hours) at 05/17/16 1434  Last data filed at 05/17/16 1200   Gross per 24 hour   Intake           1691.7 ml   Output             3100 ml   Net          -1408.3 ml  General Appearance:  Breathing comfortable, no acute distress  Neck:  No carotid bruit, + jugular venous distension, brisk carotid upstroke  Lungs:  Crackles at the bases bilaterally, good respiratory effort   Heart:  S1, S2 normal, no S3, no S4, no murmur, PMI not displaced, no rub   Abdomen:  Soft, non-tender, positive bowel sounds, no hepatojugular reflux  Extremities:  No cyanosis, clubbing or edema  Pulses:  Equal radial pulses, 4/4 symmetric  Neurologic:  Alert and oriented x3, mood and affect normal    Labs:     Recent Labs  Lab 05/16/16  1850 05/16/16  1221 05/16/16  0522   Creatine Kinase (CK) 235*  --   --    Troponin I 7.24* 8.03* 0.05           Recent Labs  Lab 05/16/16  1221   Cholesterol 186   Triglycerides 153*   HDL 47   LDL Calculated 108*       Recent Labs  Lab 05/17/16  0351   Bilirubin, Total 0.2   Bilirubin, Direct 0.1   Protein, Total 4.6*   Albumin 2.1*   ALT 15   AST (SGOT) 39*       Recent Labs  Lab 05/17/16  0351   Magnesium 1.8       Recent Labs  Lab 05/17/16  0458   PT 14.4   PT INR 1.1   PTT 39*       Recent Labs  Lab 05/17/16  1236 05/17/16  0458 05/17/16  0351 05/16/16  0522   WBC 5.48  --  6.14 11.85*   Hgb 7.1* 6.0* 6.2* 7.4*   Hematocrit 22.0* 19.0* 19.1* 24.0*   Platelets 215  --  185 262       Recent Labs  Lab 05/17/16  0351 05/16/16  0522   Sodium 134* 134*   Potassium 3.7 4.6   Chloride 105 106    CO2 21* 19*   BUN 31.8* 38.2*   Creatinine 2.0* 2.1*   EGFR 25.1 23.7   Glucose 119* 195*   Calcium 8.5 8.8       Recent Labs  Lab 05/16/16  1221   Thyroid Stimulating Hormone 1.02     Estimated Creatinine Clearance: 25.2 mL/min (based on SCr of 2 mg/dL).      Lab Results   Component Value Date    BNP 296.6 (H) 05/16/2016      Imaging:         Signed by: Lorelee Cover, PA    Patient seen and examined.  Agree with NP/PA note, exam, and plan as above with changes in italics.    Trivia Heffelfinger A. Jessy Oto, MD, [place]  NP [place] (8am-5pm)  MD [employer] [phone] (8am-5pm)  After hours, non urgent consult line [phone]  After Hours, urgent consults [phone]

## 2016-05-17 NOTE — Progress Notes (Signed)
Jacqueline Weber    Called re: anemia, Hgb 6.2  - no bleeding reported. Prior Hgb 7.4. Plt 185. No coags noted. Will send coags, type/screen and confirm H/H. Consider transfusion once level confirmed.

## 2016-05-17 NOTE — PT Eval Note (Signed)
[facility]  [place], [place]    Department of Rehabilitation  [phone]    Physical Therapy Evaluation    Patient: Jacqueline Weber    MRN#: [id number]     IC08/IC08-A    Time of treatment: Time Calculation  PT Received On: 05/17/16  Start Time: 1427  Stop Time: 1446  Time Calculation (min): 19 min    PT Visit Number: 1    Consult received for [name] for PT Evaluation and Treatment.  Patient's medical condition is appropriate for Physical therapy intervention at this time.      Assessment:   Jacqueline Weber is a 65 y.o. female admitted 05/16/2016.  Pt's functional mobility is impacted by:  decreased activity tolerance, decreased balance and decreased strength.  There are a few comorbidities or other factors that affect plan of care and require modification of task including: assistive device needed for mobility, has stairs to manage and lives alone.  Standardized tests and exams incorporated into evaluation include AMPAC mobility.  Pt demonstrates a stable clinical presentation due to no adverse response to activity.   Pt would continue to benefit from PT to address these deficits and increase functional independence.     Complexity Level Hx and Co  morbidites Examination Clinical Decision Making Clinical Presentation   Low no impact 1-2 elements Limited options Stable   Moderate   1-2 factors 3 or more   Several options Evolving, plan may alter   High 3 or more 4 or more Multiple options Unstable, unpredictable       Impairments: Assessment: Decreased LE strength;Decreased endurance/activity tolerance;Decreased functional mobility;Decreased balance.     Therapy Diagnosis: generalized weakness and decreased functional mobility  due to prolonged time in bed. Without therapy interventions, patient is at risk for falls, dependence on caregivers for mobility and decreased independence.    Rehabilitation Potential: Prognosis: Good;With continued PT status post acute  discharge      Plan:    Treatment/Interventions: Gait training;Stair training;Neuromuscular re-education;Functional transfer training;LE strengthening/ROM;Endurance training PT Frequency: 3-4x/wk    Risks/Benefits/POC Discussed with Pt/Family: With patient/family          Goals:   Goals  Goal Formulation: With patient/family  Time for Goal Acheivement: By time of discharge  Goals: Select goal  Pt Will Perform Sit to Stand: with supervision;to maximize functional mobility and independence;3 visits  Pt Will Stand: 3-5 min;with supervision;to maximize functional mobility and independence;5 visits  Pt Will Transfer Bed/Chair: with single point cane;with supervision;to maximize functional mobility and independence;5 visits  Pt Will Ambulate: 101-150 feet;with single point cane;with supervision;to maximize functional mobility and independence;5 visits  Pt Will Go Up / Down Stairs: 6-10 stairs;with supervision;to maximize functional mobility and independence;5 visits      Discharge Recommendations:   Based on today's session patient's discharge recommendation is the following: Home with supervision;Home with home health PT       Precautions and Contraindications: Fall risk       Medical Diagnosis: Acute pulmonary edema [J81.0]  Respiratory distress [R06.00]  Acute kidney injury [N17.9]  Hypertensive emergency [I16.1]  Anemia due to other cause [D64.89]    History of Present Illness: Jacqueline Weber is a 65 y.o. female admitted on 05/16/2016 with "shortness of breath, intermittent chest pain and bilateral lower extremity swelling for 1 week. She has significant shortness of breath on the evening of admission and presented to the emergency room where she was found to have severe hypertension to 45/113 with a  pulse of 125, and oxygen saturation 91 percent. The patient was started on intravenous nitroglycerin and given oxygen supplementation and IV labetalol with improvement in her symptoms. She is admitted to intensive care  unit" per H&P.    Patient Active Problem List   Diagnosis   . Type 2 diabetes mellitus without complication   . HTN (hypertension) urgency   . Hyperlipidemia   . History of CVA (cerebrovascular accident)   . Chest pain   . SOB (shortness of breath)   . Nausea   . Elevated d-dimer   . Hypertensive emergency        Past Medical/Surgical History:  Past Medical History:   Diagnosis Date   . Cataracts, bilateral    . Cerebrovascular accident    . CVA (cerebral infarction) 03/2013   . Diabetes mellitus    . Hypercholesteremia    . Hypertension       History reviewed. No pertinent surgical history.      X-Rays/Tests/Labs:  Echocardiogram Adult Complete W Clr/ Dopp Waveform    Result Date: 05/17/2016  1.  The quality of the study is technically adequate for interpretation. 2.  The left ventricle is normal in size. Left ventricular systolic function is normal. There are no regional wall motion abnormalities. Estimated EF is 65%. There is mild concentric left ventricular hypertrophy. There is evidence of abnormal diastolic LV function. 3.  The left atrium is moderately dilated in size. 4.  The aortic valve is trileaflet. Sclerotic aortic valve. Normal valve excursion is present. There is mild to moderate aortic insufficiency. No aortic stenosis is present. The aortic root is normal in size. 5.  The mitral valve is structurally normal. Mild mitral insufficiency is present. 6.  The right ventricle is normal in size and function. 7.  The right atrium is normal in size. 8. The tricuspid valve is structurally normal. There is no significant tricuspid insufficiency. 9. The pulmonic valve is structurally normal. There is no significant pulmonic insufficiency. 10. There is no evidence of pulmonary hypertension. 11. Trace pericardial effusion.. 12. The atrial septum is structurally normal. No shunt by color flow doppler. CONCLUSION: Left ventricle is normal in size with normal systolic function. LVEF 65%. Diastolic dysfunction noted.  Mild concentric left ventricular hypertrophy. Left atrium moderately dilated. Sclerotic aortic valve. Mild to moderate aortic regurgitation. Mild mitral regurgitation. Trace pericardial effusion Darnelle Spangle, MD 05/17/2016 4:23 PM     US Renal Kidney    Result Date: 05/16/2016  1. No hydronephrosis. 2. Asymmetric kidney size smaller on the right.. 3. Left renal simple cyst. Ardelia Mems, MD 05/16/2016 5:58 PM     Xr Chest Ap Portable    Result Date: 05/17/2016    Mild pulmonary edema, mildly improved. Maia Breslow, MD 05/17/2016 1:41 PM     Chest Ap Portable    Result Date: 05/16/2016  1. Congestive failure. This is new when compared to 09/30/2015. Dierdre Searles, MD 05/16/2016 5:47 AM         Social History:  Prior Level of Function  Prior level of function: Independent with ADLs, Ambulates with assistive device  Assistive Device: Single point cane  Baseline Activity Level: Community ambulation  Driving: does not drive  Cooking: Yes  DME Currently at Home: Single point cane  Home Living Arrangements  Living Arrangements: Alone  Type of Home: House  Home Layout: Multi-level, Bed/bath upstairs  Bathroom Shower/Tub: Tub/shower unit  DME Currently at Home: Single point cane  Subjective:    Patient is agreeable to participation in the therapy session. Nursing clears patient for therapy. No c/o pain currently. Cleared to see despite low H/H   Patient Goal  Patient Goal: To go home       Objective:   Observation of Patient/Vital Signs:  Patient is in bed with telemetry and peripheral IV in place.         Cognition/Neuro Status  Arousal/Alertness: Appropriate responses to stimuli  Attention Span: Appears intact  Following Commands: Follows one step commands without difficulty  Safety Awareness: minimal verbal instruction  Insights: Fully aware of deficits  Behavior: attentive;calm;cooperative    Sensation: intact light touch BLE    Gross ROM  Right Lower Extremity ROM: within functional limits  Left Lower Extremity ROM:  within functional limits  Gross Strength  Right Lower Extremity Strength: 4/5  Left Lower Extremity Strength: 4-/5       Functional Mobility  Supine to Sit: Supervision;HOB raised  Sit to Supine: Supervision  Sit to Stand: Minimal Assist;bed elevated  Stand to Sit: Minimal Assist  Transfers  Bed to Chair: Minimal Assist (Bed to/from [facility], stand stepping)        Balance  Balance: needs focused assessment  Sitting - Static: Good  Sitting - Dynamic: Good  Standing - Static: Fair  Standing - Dynamic: Fair    Participation and Endurance  Participation Effort: good  Endurance: Tolerates 10 - 20 min exercise with multiple rests. Reported dizziness upon sitting EOB initially, BP stable. RN aware. No SOB noted.    AM-PACT Inpatient Short Forms  Inpatient AM-PACT Performed? (PT): Basic Mobility Inpatient Short Form  AM-PACT "6 Clicks" Basic Mobility Inpatient Short Form  Turning Over in Bed: A little  Sitting Down On/Standing From Armchair: A little  Lying on Back to Sitting on Side of Bed: A little  Assist Moving to/from Bed to Chair: A little  Assist to Walk in Hospital Room: A little  Assist to Climb 3-5 Steps with Railing: A little  PT Basic Mobility Raw Score: 18  CMS 0-100% Score: 46.58%    Treatment Activities: Assisted to [facility] as above with min A for balance and safety. Pt able to perform pericare with min A for standing balance. Educated in and performed LE therex and encouraged to perform LE therex throughout the day to decrease effects of immobility. Encouraged to sit OOB as tolerated for pulmonary hygiene once pt gets some rest (requested to nap at end of session). Educated in pacing to decrease risk of developing dizziness. Pt verbalized understanding for all education provided.    Educated the patient to role of physical therapy, plan of care, goals of therapy and HEP, safety with mobility and ADLs.    At end of session pt supine in bed, call bell and items in reach. RN aware.      Ladon Applebaum, PT, DPT  Pager #:  [phone]

## 2016-05-17 NOTE — UM Notes (Signed)
65 yo female to ED 05/16/16 0507 with chest pain and SOB past 1 week with assoc LE swelling.  97, HR 125-108-104, RR 28-30-26, BP 245/113 - 232/114 - 227/109, sats 91%  PMH : CVA, DM, high chol, HTN  WBC 11.85, H&H 7.4/24.0, RBC 3.61, Glucose 195, BUN 38.2, Cr 2.1, Na 134, Co2 19, ALB 2.6, BNP 296.6  ABGs : pO2 art 162.0  CXRY : Congestive failure. This is new when compared to 09/30/2015.  US Renal kidney : 1. No hydronephrosis. 2. Asymmetric kidney size smaller on the right. 3. Left renal simple cyst.  EKG : SINUS TACHYCARDIA POSSIBLE ANTERIOR MYOCARDIAL INFARCTION , AGE UNDETERMINED  ST & T WAVE ABNORMALITY, CONSIDER INFEROLATERAL ISCHEMIA LEFT VENTRICULAR HYPERTROPHY WITH STRAIN  In ED : DuoNeb, IV Nitro gtt 200 mcg/min titrated to 50 mcg/min, 40mg  IV Lasix, 1mg  IV Ativan, 20mg  IV Labetalol xs 2, BiPap placed  Admit : Severe HTNsive emergency / CHF/pulm edema with RI req BiPap    MD assessment/plan :  Assessment:   65 year old female with hypertension, diabetes, prior CVA, presenting with hypertensive urgency and pulmonary edema.  She also has worsening renal function, rule out acute on chronic renal failure, and anemia likely of chronic disease.    Plan:   Blood pressure control, diuresis, ICU management.  Nephrology consultation.  Check iron studies, echocardiogram.  Cardiac enzymes .  See orders.  Consultant Dr. Esaw Dace for nephrology.    Inpatient admit order 05/16/16 0701, ICU, BiPap, po Amlodipine, Coreg, Clonidine, Pepcid, SQ Heparin 5000units q12, po Hydralazine, Losartan, Zocor, IV Nitro gtt, diet as tol, Nephro, Cardio, activity as tol, PTOT

## 2016-05-17 NOTE — Progress Notes (Signed)
Pt transferred to [place] via wheelchiar. Report given to receiving RN, Colletta [place].  VSS at time of transfer.  Family present and accompanied patient to new room.  Information on right to decide given to pt daughters at their request.

## 2016-05-17 NOTE — Progress Notes (Signed)
05/17/16 1327   Patient Type   Within 30 Days of Previous Admission? No   Healthcare Decisions   Interviewed: Family   Name of interviewee if other than the pt: Shelbia Scinto, daughter, [phone] cell    Interviewee Contact Information: Keeley Sussman, daughter, [phone] cell    Orientation/Decision Making Abilities of Patient Patient on ventilator   Advance Directive Patient does not have advance directive   Healthcare Agent Appointed No  (daughter told the CM tha the family member will make decision together if needed)   Additional Emergency Contacts? [place], son, [phone] cell    Prior to admission   Prior level of function Independent with ADLs;Ambulates independently   Type of Residence Private residence   Home Layout Multi-level  ([place], no stair outside to enter, 14 steps to go to her bedroom as well as basement )   Have running water, electricity, heat, etc? Yes   Living Arrangements Children;Family members   How do you get to your MD appointments? daughter or son   How do you get your groceries? daughter   Who fixes your meals? herself or daughter    Who does your laundry? herself    Who picks up your prescriptions? daughter or son    Dressing Independent   Grooming Independent   Feeding Independent   Bathing Independent   Toileting Independent   DME Currently at Home Other (Comment)  (none for ambulation.  patient has a nebulizer at home if needed )   Home Care/Community Services None   Prior SNF admission? (Detail) none   Prior Rehab admission? (Detail) none   Adult Protective Services (APS) involved? No   Discharge Planning   Support Systems Children;Family members;Friends/neighbors   Patient expects to be discharged to: TBD after patient is extubated    Anticipated [place] plan discussed with: Same as interviewed;Family   [place] discussion contact information: Aurea Aronov, daughter, [phone] cell    Potential barriers to discharge: Testing/procedure   Mode of  transportation: Other  (TBD)   Consults/Providers   PT Evaluation Needed 1  (yes)   OT Evalulation Needed 1  (yes)   SLP Evaluation Needed 2  (not yet, but will need speech eval after patient is extubated)   Outcome Palliative Care Screen Screened but did not meet criteria for intervention   Correct PCP listed in Epic? Yes   Important Message from [facility] Notice   Patient received 1st IMM Letter? Yes   Date of most recent IMM given: 05/16/16

## 2016-05-17 NOTE — Progress Notes (Signed)
[facility].  Progress Note    Assessment:   Hypertensive urgency   AKI vs CKD   DM   Proteinuria   Severe anemia   Discrepancy between kidney size    Plan:   Add Losartan   Hematology consult   Quantify proteinuria   Check LDH , haptoglobin    Queen Slough, MD  Office - [phone]  ++++++++++++++++++++++++++++++++++++++++++++++++++++++++++++++  Subjective:  No new complaints    Medications:  Scheduled Meds:  Current Facility-Administered Medications   Medication Dose Route Frequency   . amLODIPine  10 mg Oral Daily   . carvedilol  25 mg Oral Q12H SCH   . cloNIDine  0.1 mg Oral BID   . famotidine  20 mg Oral Daily   . heparin (porcine)  5,000 Units Subcutaneous Q12H [facility]   . hydrALAZINE  100 mg Oral BID   . insulin lispro  1-6 Units Subcutaneous QHS   . insulin lispro  2-10 Units Subcutaneous TID AC   . simvastatin  10 mg Oral QHS     Continuous Infusions:  . nitroglycerin Stopped (05/17/16 0640)     PRN Meds:Nursing communication: Adult Hypoglycemia Treatment Algorithm **AND** dextrose **AND** dextrose **AND** glucagon (rDNA) **AND** Nursing communication: Document Significant Event Note    Objective:  Vital signs in last 24 hours:  Temp:  [97.6 F (36.4 C)-98.8 F (37.1 C)] 98.2 F (36.8 C)  Heart Rate:  [75-105] 91  Resp Rate:  [15-28] 18  BP: (117-179)/(47-96) 154/64    Intake/Output from yesterday (07:01 - 07:00):  09/12 0701 - 09/13 0700  In: 1974.7 [P.O.:825; I.V.:1119]  Out: 84 [Urine:4750]     Physical Exam:   Gen: WD WN NAD   CV: S1 S2 N RRR   Chest: CTAB   Ab: ND NT soft no HSM +BS   Ext: No C/E    Labs:    Recent Labs  Lab 05/17/16  0351 05/16/16  1221 05/16/16  0522   Glucose 119*  --  195*   BUN 31.8*  --  38.2*   Creatinine 2.0*  --  2.1*   Calcium 8.5  --  8.8   Sodium 134*  --  134*   Potassium 3.7  --  4.6   Chloride 105  --  106   CO2 21*  --  19*   Albumin  --   --  2.6*   Magnesium 1.8 1.4*  --        Recent Labs  Lab 05/17/16  0458  05/17/16  0351 05/16/16  0522   WBC  --  6.14 11.85*   Hgb 6.0* 6.2* 7.4*   Hematocrit 19.0* 19.1* 24.0*   MCV  --  65.0* 66.5*   MCH  --  21.1* 20.5*   MCHC  --  32.5 30.8*   RDW  --  15 16*   MPV  --  11.1 11.0   Platelets  --  185 262

## 2016-05-17 NOTE — Progress Notes (Signed)
[facility] Note     ICU Daily Progress Note        Date Time: 05/17/16 10:30 PM  Patient Name: Jacqueline Weber  Attending Physician: Nino Parsley, MD  Room: M208/M208-B   Admit Date: 05/16/2016  LOS: 1 day            Assessment:     Patient Active Problem List   Diagnosis   . Type 2 diabetes mellitus without complication   . HTN (hypertension) urgency   . Hyperlipidemia   . History of CVA (cerebrovascular accident)   . Chest pain   . SOB (shortness of breath)   . Nausea   . Elevated d-dimer   . Hypertensive emergency     Blood pressure with adequate control  Anemia of chronic disease  Positive troponin 8.0  Echocardiogram February 2017 with concentric left ventricular hypertrophy, diastolic dysfunction, grade 1, ejection fraction left ventricle, 70 percent  Chronic kidney disease    Plan:   Continue present hypertensive treatment  Transferred to telemetry, rule out renal artery stenosis.  Procrit for chronic anemia  Coreg increased per cardiology  Left heart catheterization in the future for possible coronary artery disease per cardiology, once renal function is stable.    Subjective:   No acute distress    Medications:       Scheduled Meds: PRN Meds:      amLODIPine 10 mg Oral Daily   carvedilol 25 mg Oral Q12H [place]   cloNIDine 0.1 mg Oral BID   famotidine 20 mg Oral Daily   heparin (porcine) 5,000 Units Subcutaneous Q12H [place]   hydrALAZINE 100 mg Oral BID   insulin lispro 1-6 Units Subcutaneous QHS   insulin lispro 2-10 Units Subcutaneous TID AC   losartan 25 mg Oral Daily   simvastatin 10 mg Oral QHS       Continuous Infusions:  . nitroglycerin Stopped (05/17/16 0640)      dextrose 15 g of glucose PRN   And     dextrose 125 mL PRN   And     glucagon (rDNA) 1 mg PRN             Physical Exam:     Vitals:    05/17/16 1500 05/17/16 1600 05/17/16 1736 05/17/16 2134   BP: 130/58 153/65 174/72 139/90   Pulse: 75 79 79 77   Resp: 20 18 16 16    Temp:   97.9 F (36.6 C) 98.1 F (36.7 C)    TempSrc:       SpO2: 98% 99% 97% 95%   Weight:       Height:         Temp (24hrs), Avg:98.5 F (36.9 C), Min:97.9 F (36.6 C), Max:99.5 F (37.5 C)           09/12 0701 - 09/13 0700  In: 1974.7 [P.O.:825; I.V.:1119]  Out: 4750 [Urine:4750]       General Appearance: Comfortable respirations  Mental status: Alert  Neuro:  Nonfocal  H & N: No JVD  Lungs: Few basilar rales, otherwise clear anteriorly  Cardiac: Regular, normal sounds  Abdomen:  Benign  Extremities: No CCE  Skin: No rash.  Good peripheral pulses      Data:         Labs:     Recent CBC   Recent Labs  Lab 05/17/16  1236 05/17/16  0458 05/17/16  0351 05/16/16  0522   WBC 5.48  --  6.14 11.85*  RBC 3.40*  --  2.94* 3.61*   Hgb 7.1* 6.0* 6.2* 7.4*   Hematocrit 22.0* 19.0* 19.1* 24.0*   MCV 64.7*  --  65.0* 66.5*   Platelets 215  --  185 262         Recent Labs  Lab 05/17/16  0458 05/17/16  0351 05/16/16  1850 05/16/16  1221 05/16/16  0522   Sodium  --  134*  --   --  134*   Potassium  --  3.7  --   --  4.6   Chloride  --  105  --   --  106   CO2  --  21*  --   --  19*   Glucose  --  119*  --   --  195*   BUN  --  31.8*  --   --  38.2*   Creatinine  --  2.0*  --   --  2.1*   Magnesium  --  1.8  --  1.4*  --    AST (SGOT)  --  39*  --   --  20   ALT  --  15  --   --  15   Alkaline Phosphatase  --  53  --   --  68   Bilirubin, Total  --  0.2  --   --  0.1*   Bilirubin, Direct  --  0.1  --   --   --    B-Natriuretic Peptide  --   --   --   --  296.6*   PT 14.4  --   --   --   --    PT INR 1.1  --   --   --   --    PTT 39*  --   --   --   --    Creatine Kinase (CK)  --   --  235*  --   --    Troponin I  --   --  7.24* 8.03* 0.05           Rads:     Radiology Results (24 Hour)     Procedure Component Value Units Date/Time    XR Chest AP Portable [[id number]] Collected:  05/17/16 1340    Order Status:  Completed Updated:  05/17/16 1345    Narrative:       CLINICAL HISTORY: respiratory distress    COMPARISON: Chest x-ray dated 05/16/2016    AP view of the chest  was performed.    FINDINGS:        There remains mild pulmonary edema, predominantly right-sided, which  appears mildly improved.  The heart size and bones are within normal  limits.      Impression:         Mild pulmonary edema, mildly improved.    Maia Breslow, MD   05/17/2016 1:41 PM            Radiological Imaging personally reviewed.    I have personally reviewed the patient's history and 24 hour interval events, along with vitals, labs, radiology images  and additional findings found in detail within ICU team notes, with their care plans developed with and reviewed by me.         Signed by: Nino Parsley, MD  Date/Time: 05/17/16 10:30 PM

## 2016-05-17 NOTE — Progress Notes (Addendum)
60: Called Dr. Faye Ramsay with [facility] regarding labs. Orders to draw coags, type and screen and H&H. Also regarding K and mag.     0600: Discussed with Dr. Faye Ramsay on repeat labs and language barrier with patient. Patient understands limited [place]. Multiple family members, will wait until bedside rounds per Dr. Faye Ramsay

## 2016-05-18 ENCOUNTER — Ambulatory Visit (INDEPENDENT_AMBULATORY_CARE_PROVIDER_SITE_OTHER): Payer: Self-pay

## 2016-05-18 ENCOUNTER — Ambulatory Visit: Admission: RE | Admit: 2016-05-18 | Payer: Medicare Other | Source: Ambulatory Visit | Admitting: Cardiovascular Disease

## 2016-05-18 ENCOUNTER — Inpatient Hospital Stay: Payer: Medicare Other

## 2016-05-18 ENCOUNTER — Encounter: Admission: RE | Payer: Self-pay | Source: Ambulatory Visit

## 2016-05-18 DIAGNOSIS — D649 Anemia, unspecified: Secondary | ICD-10-CM

## 2016-05-18 DIAGNOSIS — E119 Type 2 diabetes mellitus without complications: Secondary | ICD-10-CM

## 2016-05-18 DIAGNOSIS — I214 Non-ST elevation (NSTEMI) myocardial infarction: Principal | ICD-10-CM

## 2016-05-18 LAB — CELL MORPHOLOGY
Cell Morphology: ABNORMAL — AB
Platelet Estimate: NORMAL

## 2016-05-18 LAB — BASIC METABOLIC PANEL
Anion Gap: 10 (ref 5.0–15.0)
BUN: 32.2 mg/dL — ABNORMAL HIGH (ref 7.0–19.0)
CO2: 22 mEq/L (ref 22–29)
Calcium: 8.3 mg/dL — ABNORMAL LOW (ref 8.5–10.5)
Chloride: 107 mEq/L (ref 100–111)
Creatinine: 2.3 mg/dL — ABNORMAL HIGH (ref 0.6–1.0)
Glucose: 148 mg/dL — ABNORMAL HIGH (ref 70–100)
Potassium: 3.9 mEq/L (ref 3.5–5.1)
Sodium: 139 mEq/L (ref 136–145)

## 2016-05-18 LAB — MAGNESIUM: Magnesium: 1.9 mg/dL (ref 1.6–2.6)

## 2016-05-18 LAB — PREPARE RBC
Expiration Date: 201710092359
Expiration Date: 201710122359
Status: TRANSFUSED
Status: TRANSFUSED
UTYPE: O POS
UTYPE: O POS

## 2016-05-18 LAB — CBC
Absolute NRBC: 0 10*3/uL
Hematocrit: 20.9 % — ABNORMAL LOW (ref 37.0–47.0)
Hgb: 6.7 g/dL — ABNORMAL LOW (ref 12.0–16.0)
MCH: 20.6 pg — ABNORMAL LOW (ref 28.0–32.0)
MCHC: 32.1 g/dL (ref 32.0–36.0)
MCV: 64.3 fL — ABNORMAL LOW (ref 80.0–100.0)
MPV: 10.8 fL (ref 9.4–12.3)
Nucleated RBC: 0 /100 WBC (ref 0.0–1.0)
Platelets: 202 10*3/uL (ref 140–400)
RBC: 3.25 10*6/uL — ABNORMAL LOW (ref 4.20–5.40)
RDW: 15 % (ref 12–15)
WBC: 5.38 10*3/uL (ref 3.50–10.80)

## 2016-05-18 LAB — GFR: EGFR: 21.4

## 2016-05-18 LAB — GLUCOSE WHOLE BLOOD - POCT
Whole Blood Glucose POCT: 152 mg/dL — ABNORMAL HIGH (ref 70–100)
Whole Blood Glucose POCT: 196 mg/dL — ABNORMAL HIGH (ref 70–100)
Whole Blood Glucose POCT: 142 mg/dL — ABNORMAL HIGH (ref 70–100)
Whole Blood Glucose POCT: 160 mg/dL — ABNORMAL HIGH (ref 70–100)

## 2016-05-18 SURGERY — LEFT HEART CATH POSS PCI
Anesthesia: Conscious Sedation

## 2016-05-18 MED ORDER — FUROSEMIDE 10 MG/ML IJ SOLN
20.0000 mg | Freq: Once | INTRAMUSCULAR | Status: AC
Start: 2016-05-18 — End: 2016-05-18
  Administered 2016-05-18: 20 mg via INTRAVENOUS
  Filled 2016-05-18: qty 2

## 2016-05-18 MED ORDER — ASPIRIN 81 MG PO CHEW
81.0000 mg | CHEWABLE_TABLET | Freq: Every day | ORAL | Status: DC
Start: 2016-05-18 — End: 2016-05-19
  Administered 2016-05-19: 81 mg via ORAL
  Filled 2016-05-18: qty 1

## 2016-05-18 MED ORDER — DARBEPOETIN ALFA 60 MCG/0.3ML IJ SOSY
60.0000 ug | PREFILLED_SYRINGE | INTRAMUSCULAR | Status: DC
Start: 2016-05-18 — End: 2016-05-19
  Administered 2016-05-18: 60 ug via SUBCUTANEOUS
  Filled 2016-05-18: qty 0.3

## 2016-05-18 MED ORDER — ASPIRIN 81 MG PO CHEW
CHEWABLE_TABLET | ORAL | Status: AC
Start: 2016-05-18 — End: 2016-05-18
  Administered 2016-05-18: 81 mg via ORAL
  Filled 2016-05-18: qty 1

## 2016-05-18 MED ORDER — SODIUM CHLORIDE 0.9 % IV SOLN
INTRAVENOUS | Status: DC | PRN
Start: 2016-05-18 — End: 2016-05-19

## 2016-05-18 NOTE — Progress Notes (Signed)
First unit still infusing, vitals have been stable, endorsed to incoming shift nurse Urim will complete the process.

## 2016-05-18 NOTE — OT Eval Note (Signed)
[facility]  [place]  [phone]    Occupational Therapy Evaluation    Patient: Jacqueline Weber    MRN#: [id number]     [id number]/[id number]    Time of treatment: Time Calculation  OT Received On: 05/18/16  Start Time: 1015  Stop Time: 1038  Time Calculation (min): 23 min  OT Visit Number: 1    Consult received for Jacqueline Weber for OT Evaluation and Treatment.  Patient's medical condition is appropriate for Occupational therapy intervention at this time.    Assessment:   .Jacqueline Weber is a 65 y.o. female admitted 05/16/2016.   Expanded chart review completed including review of labs, review of imaging, review of vitals, time spent in ICU, calling pt's family/caregiver for thorough history, review of H&P and physician progress notes and review of consulting physician notes .  Pt's ability to complete ADLs and functional transfers is impaired due to the following deficits:  decreased activity tolerance, decreased balance, decreased safety awareness, decreased strength and transfers .  Pt demonstrates performance deficits with grooming, bathing, dressing, toileting and functional mobility. There are a few comorbidities or other factors that affect plan of care and require modification of task including: has stairs to manage.  Pt would continue to benefit from OT to address these deficits and increase functional independence.    Assessment: decreased strength;balance deficits;decreased independence with ADLs;decreased safety awareness;decreased independence with IADLs;decreased endurance/activity tolerance     Complexity Chart Review Performance Deficits Clinical Decision Making Hx/Comorbidities Assistance needed   Low Brief 1-3 Limited options None None (or at baseline)   Moderate Expanded 3-5 Several Options 1-2 Min/Mod assist (not at baseline)   High Extensive 5 or more Multiple options 3 or more Max/dependent (not at baseline      Therapy Diagnosis: generalized weakness, decreased functional mobility  and decreased independence with ADL's due to current illness. Without therapy interventions, patient is at risk for falls and decreased independence.    Rehabilitation Potential: Prognosis: Good;With family;With continued OT s/p acute discharge      Plan:   OT Frequency Recommended: 3-4x/wk   Treatment Interventions: ADL retraining;Functional transfer training;UE strengthening/ROM;Endurance training;Patient/Family training;Neuro muscular reeducation     Patient Goal  Patient Goal:  (to go home)    Risks/Benefits/POC Discussed with Pt/Family: With patient/family    Goals:   Goal Formulation: Patient;Family  Time For Goal Achievement: by time of discharge  ADL Goals  Patient will groom self: Supervision;at sinkside;5 visits  Patient will dress lower body: Supervision;5 visits  Patient will toilet: Jacqueline Weber visits  Mobility and Transfer Goals  Pt will transfer bed to toilet: Supervision;with rolling walker;5 visits                         Discharge Recommendations:   Based on today's session patient's discharge recommendation is the following: Discharge Recommendation: Home with supervision;Home with home health OT.      DME Recommended for Discharge: Shower chair;Grab bars (HH shower hose)        Precautions and Contraindications: Falls Risk         Medical Diagnosis: Acute pulmonary edema [J81.0]  Respiratory distress [R06.00]  Acute kidney injury [N17.9]  Hypertensive emergency [I16.1]  Anemia due to other cause [D64.89]    History of Present Illness: Jacqueline Weber is a 65 y.o. female admitted on 05/16/2016 with "shortness of breath, intermittent chest pain and bilateral lower extremity swelling  for 1 week.  She has significant shortness of breath on the evening of admission and presented to the emergency room where she was found to have severe hypertension to 45/113 with a pulse of 125, and oxygen saturation 91 percent.  The patient  was started on intravenous nitroglycerin and given oxygen supplementation and IV labetalol with improvement in her symptoms.  She is admitted to intensive care unit.  At the time of our exam.  She denies acute chest pain and her breathing is more comfortable, still with mild shortness of breath.  She denies fever, cough or congestion.  There has been wheezing.  She was hospitalized in January 2017 for atypical chest pain, negative cardiac enzymes have hypertension.  Norvasc was added at that time.       By history the patient has been compliant with medications but has stopped taking her Norvasc."--as per H & P note.       Patient Active Problem List   Diagnosis   . Type 2 diabetes mellitus without complication   . HTN (hypertension) urgency   . Hyperlipidemia   . History of CVA (cerebrovascular accident)   . Chest pain   . SOB (shortness of breath)   . Nausea   . Elevated d-dimer   . Hypertensive emergency        Past Medical/Surgical History:  Past Medical History:   Diagnosis Date   . Cataracts, bilateral    . Cerebrovascular accident    . CVA (cerebral infarction) 03/2013   . Diabetes mellitus    . Hypercholesteremia    . Hypertension       History reviewed. No pertinent surgical history.      X-Rays/Tests/Labs:  Echocardiogram Adult Complete W Clr/ Dopp Waveform     Result Date: 05/17/2016  1.  The quality of the study is technically adequate for interpretation. 2.  The left ventricle is normal in size. Left ventricular systolic function is normal. There are no regional wall motion abnormalities. Estimated EF is 65%. There is mild concentric left ventricular hypertrophy. There is evidence of abnormal diastolic LV function. 3.  The left atrium is moderately dilated in size. 4.  The aortic valve is trileaflet. Sclerotic aortic valve. Normal valve excursion is present. There is mild to moderate aortic insufficiency. No aortic stenosis is present. The aortic root is normal in size. 5.  The mitral valve is  structurally normal. Mild mitral insufficiency is present. 6.  The right ventricle is normal in size and function. 7.  The right atrium is normal in size. 8. The tricuspid valve is structurally normal. There is no significant tricuspid insufficiency. 9. The pulmonic valve is structurally normal. There is no significant pulmonic insufficiency. 10. There is no evidence of pulmonary hypertension. 11. Trace pericardial effusion.. 12. The atrial septum is structurally normal. No shunt by color flow doppler. CONCLUSION: Left ventricle is normal in size with normal systolic function. LVEF 65%. Diastolic dysfunction noted. Mild concentric left ventricular hypertrophy. Left atrium moderately dilated. Sclerotic aortic valve. Mild to moderate aortic regurgitation. Mild mitral regurgitation. Trace pericardial effusion Darnelle Spangle, MD 05/17/2016 4:23 PM      US Renal Kidney     Result Date: 05/16/2016  1. No hydronephrosis. 2. Asymmetric kidney size smaller on the right.. 3. Left renal simple cyst. Ardelia Mems, MD 05/16/2016 5:58 PM      Xr Chest Ap Portable     Result Date: 05/17/2016    Mild pulmonary edema, mildly improved. Tammi Klippel  Kela Millin, MD 05/17/2016 1:41 PM      Chest Ap Portable     Result Date: 05/16/2016  1. Congestive failure. This is new when compared to 09/30/2015. Dierdre Searles, MD 05/16/2016 5:47 AM       Social History:  Prior Level of Function  Prior level of function: Independent with ADLs, Ambulates with assistive device  Assistive Device: Single point cane  Baseline Activity Level: Community ambulation  Driving: does not drive  Cooking: Yes  DME Currently at Home: Single point cane  Home Living Arrangements  Living Arrangements: Alone  Type of Home: House  Home Layout: Multi-level, Bed/bath upstairs  Bathroom Shower/Tub: Tub/shower unit  DME Currently at Home: Single point cane      Subjective:   Patient is agreeable to participation in the therapy session. Family and/or guardian are agreeable to patient's  participation in the therapy session. Nursing clears patient for therapy.  Subjective:  (Patient's daughter at bedside and used as an interpreter as per Patient's request.)Paitent cleared to participate in OT services despite low H & H.   Pain Assessment  Pain Assessment: No/denies pain.        Objective:   Observation of Patient/Vital Signs:  Patient is in bed with telemetry and peripheral IV in place.         Cognition/Neuro Status  Arousal/Alertness: Appropriate responses to stimuli  Attention Span: Appears intact  Orientation Level: Oriented to person (further orientation questions not asked)  Memory: Appears intact  Following Commands: independent  Safety Awareness: minimal verbal instruction  Behavior: calm;attentive;cooperative  Hand Dominance: right handed    Gross ROM  Right Upper Extremity ROM: within functional limits via clinical observation  Left Upper Extremity ROM: within functional limits via clinical observation  Gross Strength  Right Upper Extremity Strength: within functional limits (via clinical observation)  Left Upper Extremity Strength: within functional limits (via clinical observation)          Sensory  Auditory: intact  Tactile - Light Touch: intact (per Patient report)  Visual Acuity: wears glasses (for reading)       Self-care and Home Management  Grooming: Contact Guard Assist;standing at sink;steadying;verbal prompting;wash/dry hands;wash/dry face;teeth care  LB Dressing: Supervision;sitting;in bed;Supervision/safety (to re-arrange socks)  Toileting: Contact Guard Assist;perineal hygiene;clothing management down;clothing management up;grab bar use;steadying;verbal prompting  Functional Transfers: Contact Guard Assist;toilet transfer;steadying;verbal prompting    Mobility and Transfers  Supine to Sit: Contact Guard Assist;HOB raised;Increased Time;Increased Effort  Sit to Stand: Contact Guard Assist;with instruction for hand placement to increase safety (from bed)  Bed to Toilet  Transfer: Contact Guard Assist (w/o A.D.; noted Patient reaching for furniture with B UEs in effort to gain stability.)  -verbal/tactile instructions for proper hand placement and pacing with all functional transfers for increased safety.        Balance  Static Sitting Balance: good  Static Standing Balance: good  Dynamic Standing Balance: fair    Participation and Endurance  Participation Effort: fair  Endurance: Tolerates 10 - 20 min exercise with multiple rests    AM-PACT "6 Clicks" Daily Activity Inpatient Short Form  Inpatient AM-PACT Performed?: yes  Put On/Take Off Lower Body Clothing: A little  Assist with Bathing: A little  Assist with Toileting: A little  Put On/Take Off Upper Body Clothing: None  Assist with Grooming: A little  Assist with Eating: None  OT Daily Activity Raw Score: 20  CMS 0-100% Score: 38.32%    Treatment Activities: Patient c/o feeling "dizzy" upon  sitting at EOB, which Patient stated resolved after approx. 1 minute rest break and instructions to perform B ankle pumps. Patient denied feeling lightheaded or dizzy for remainder of the session.  Patient instructed in proper pacing with transitional mvmts, encouraging Patient to wait atleast 60 secs. To notice how she feels (lightheaded or dizzy) before attempting to stand/walk for fall prevention. Patient verbalized understanding of same. Noted Patient reaching for walls and furniture with functional mobility in/out of bathroom. Discussed with Patient and Patient's daughter importance of using a rw to increase balance/fall prevention. Discussed with Patient and Patient's daughter home safety recommendations for bathroom use. Recommendations made for shower seat with back, hand-held shower hose, non-skid bathmat, grab bars in shower and around toilet, raised toilet seat for energy conservation and fall prevention. Also advised Patient to have Supervision with shower transfers and showering upon d/c to ensure safety. Patient educated in  importance of OOB sitting for all meals to increase endurance/strength for activities and for pulmonary hygiene. Patient seated in arm-chair at end of session with all needs within reach. Patient instructed to ring for nursing for all needs and appeared receptive to all education provided.        Educated the patient to role of occupational therapy, plan of care, goals of therapy and safety with mobility and ADLs, home safety.    Hewitt Blade. Rigoberto Noel, MS,OTR/L  Pager # [phone]  [phone]

## 2016-05-18 NOTE — Progress Notes (Addendum)
[facility]  Progress Note  Patient Info:   Date/Time: 05/18/2016 / 3:32 PM   Admit Date:05/16/2016  Patient Name:Jacqueline Weber   MAY:[id number]   PCP: Laureen Ochs, PA  Attending Physician:Key Cen, Maryjean Ka, MD     Subjective:   05/18/16   Patient feels well today.  No pain or shortness of breath.  Chief Complaint:  Shortness of Breath and Chest Pain  Update of Review of Systems:  Review of Systems   Constitutional: Negative for chills and fever.   HENT: Negative for sore throat.    Cardiovascular: Negative for chest pain and palpitations.   Neurological: Negative for dizziness and headaches.   All other systems reviewed and are negative.    Assessment and Plan:    1.  Hypertensive emergency-this caused fluid overload.  Patient was on IV nitroglycerin.  She is currently on carvedilol, amlodipine, clonidine, hydralazine, and losartan.  This seems to be controlling her blood pressure.  Appreciate nephrology's involvement.  We will check a renal duplex.   2.  Non-ST elevation myocardial infarction-patient's troponin peaked at 8.  She needs a cardiac catheterization, but unclear if this will be as an inpatient or outpatient.  Spoke with cardiology today.  Confirmed with nephrology that her renal function will not improve much.  We will defer to them regarding cardiac catheterization.  Echocardiogram without any significant abnormalities.  We will continue aspirin and carvedilol.  She is also on losartan.   3.  Anemia-may be related to her renal function.  Patient received a dose of darbepoetin today.  As patient's hemoglobin is less than 7 and she is likely having a myocardial infarction, we will transfuse with a dose of furosemide in the middle.  Went over the consent with the patient and the daughter.  No signs of bleeding at this time.  We will check a complete blood count tomorrow.   4.  Acute on chronic kidney injury-patient's creatinine is a bit higher today.  This may be due to the  losartan, but this helped dramatically with her blood pressure.  We will check a BMP tomorrow and order a renal duplex.   5.  Hyperlipidemia-we will continue patient's simvastatin.   6.  Diabetes mellitus-she is on sliding scale insulin.    DVT Prophylaxis:heparin   Central Line/Foley Catheter/PICC line status: None  Code Status: Full Code  Disposition:home  Prognosis: Guarded    Hospital Problems:   Active Problems:    Hypertensive emergency    Objective:     Vitals:    05/18/16 0814 05/18/16 0906 05/18/16 0938 05/18/16 1249   BP: 151/73 131/69 131/70 120/65   Pulse: 85 82 85 74   Resp:   16 16   Temp:   97.7 F (36.5 C) 98.2 F (36.8 C)   TempSrc:   Oral Oral   SpO2: 95% 97% 98% 97%   Weight:       Height:         Physical Exam:   Physical Exam   Constitutional: She is oriented to person, place, and time. No distress.   HENT:   Head: Normocephalic and atraumatic.   Eyes: Conjunctivae and EOM are normal. No scleral icterus.   Neck: Neck supple. No tracheal deviation present.   Cardiovascular: Normal rate and regular rhythm.    No murmur heard.  Pulmonary/Chest: Effort normal and breath sounds normal. No respiratory distress. She has no wheezes.   Abdominal: Soft. Bowel sounds are normal. She exhibits no distension. There  is no tenderness.   Musculoskeletal: Normal range of motion. She exhibits no edema.   Neurological: She is alert and oriented to person, place, and time. No cranial nerve deficit. GCS score is 15.   Skin: Skin is warm and dry. She is not diaphoretic. No erythema.     Results of Labs/imaging   Labs and radiology reports have been reviewed.    Telemetry: Normal sinus rhythm, 80  Reviewed patient's entire ICU and hospital course.    Hospitalist   Signed by:   Stacey Drain, MD  05/18/2016 3:32 PM    *This note was generated by the Epic EMR system/ Dragon speech recognition and may contain inherent errors or omissions not intended by the user. Grammatical errors, random word insertions, deletions,  pronoun errors and incomplete sentences are occasional consequences of this technology due to software limitations. Not all errors are caught or corrected. If there are questions or concerns about the content of this note or information contained within the body of this dictation they should be addressed directly with the author for clarification

## 2016-05-18 NOTE — Progress Notes (Signed)
[facility].  Progress Note    Assessment:   Hypertensive urgency- BP better   AKI vs CKD   DM   Proteinuria   Severe anemia   Discrepancy between kidney size    Plan:   Please get renal artery doppler   Consider blood transfusion   ESA - started    Queen Slough, MD  Office - [phone]  ++++++++++++++++++++++++++++++++++++++++++++++++++++++++++++++  Subjective:  No new complaints    Medications:  Scheduled Meds:  Current Facility-Administered Medications   Medication Dose Route Frequency   . amLODIPine  10 mg Oral Daily   . aspirin  81 mg Oral Daily   . carvedilol  25 mg Oral Q12H SCH   . cloNIDine  0.1 mg Oral BID   . darbepoetin alfa  60 mcg Subcutaneous Weekly   . famotidine  20 mg Oral Daily   . heparin (porcine)  5,000 Units Subcutaneous Q12H [facility]   . hydrALAZINE  100 mg Oral BID   . insulin lispro  1-6 Units Subcutaneous QHS   . insulin lispro  2-10 Units Subcutaneous TID AC   . losartan  25 mg Oral Daily   . simvastatin  10 mg Oral QHS     Continuous Infusions:  . nitroglycerin Stopped (05/17/16 0640)     PRN Meds:Nursing communication: Adult Hypoglycemia Treatment Algorithm **AND** dextrose **AND** dextrose **AND** glucagon (rDNA) **AND** Nursing communication: Document Significant Event Note    Objective:  Vital signs in last 24 hours:  Temp:  [97.3 F (36.3 C)-98.2 F (36.8 C)] 98.2 F (36.8 C)  Heart Rate:  [74-85] 74  Resp Rate:  [16-20] 16  BP: (120-174)/(65-90) 120/65    Intake/Output from yesterday (07:01 - 07:00):  09/13 0701 - 09/14 0700  In: 720 [P.O.:720]  Out: 2420 [Urine:2420]     Physical Exam:   Gen: WD WN NAD   CV: S1 S2 N RRR   Chest: CTAB   Ab: ND NT soft no HSM +BS   Ext: No C/E    Labs:    Recent Labs  Lab 05/18/16  0651 05/17/16  0351 05/16/16  1221 05/16/16  0522   Glucose 148* 119*  --  195*   BUN 32.2* 31.8*  --  38.2*   Creatinine 2.3* 2.0*  --  2.1*   Calcium 8.3* 8.5  --  8.8   Sodium 139 134*  --  134*   Potassium 3.9 3.7  --  4.6    Chloride 107 105  --  106   CO2 22 21*  --  19*   Albumin  --  2.1*  --  2.6*   Magnesium 1.9 1.8 1.4*  --        Recent Labs  Lab 05/18/16  0651 05/17/16  1236 05/17/16  0458 05/17/16  0351   WBC 5.38 5.48  --  6.14   Hgb 6.7* 7.1* 6.0* 6.2*   Hematocrit 20.9* 22.0* 19.0* 19.1*   MCV 64.3* 64.7*  --  65.0*   MCH 20.6* 20.9*  --  21.1*   MCHC 32.1 32.3  --  32.5   RDW 15 15  --  15   MPV 10.8 10.6  --  11.1   Platelets 202 215  --  185

## 2016-05-18 NOTE — Plan of Care (Signed)
Problem: Safety  Goal: Patient will be free from injury during hospitalization  Outcome: Progressing   05/18/16 1120   Goal/Interventions addressed this shift   Patient will be free from injury during hospitalization  Assess patient's risk for falls and implement fall prevention plan of care per policy;Provide and maintain safe environment;Use appropriate transfer methods;Ensure appropriate safety devices are available at the bedside;Include patient/ family/ care giver in decisions related to safety;Hourly rounding       Comments: Cardiologist encouraged patient to ambulate on the hallways, daughter at the bedside. Will assist patient as ordered.

## 2016-05-18 NOTE — Progress Notes (Addendum)
[place]    Date Time: 05/18/16 10:49 AM  Patient Name: Jacqueline Weber, Jacqueline Weber           Assessment:      Hypertensive crisis, CHF, chest pain, improved, off IV NTG at this point -> improved, now on oral meds   Troponin up to 8.03 (peak)   Recent outpatient Chest pain with abnormal nuclear stress test findings, February 2017, nuclear stress test, small-sized, mild intensity inferoapical ischemia, EF 72%  ? Pt set up for outpatient LHC but cancelled due to elevated creat. (1.9 as outpatient, 2.1 inpatient)   February 2017, echo, EF 70%, concentric LVH, grade I diastolic dysfunction,    mild AI.   Hypertension   Hyperlipidemia, 09/2015, TC 148, TRIG 74, HDL 49, LDL 84 (all at goal).    Recommendations:    Creat up from 2.0->2.3 today, clinically euvoemic, can hold off on diuretics today   Procrit per nephrology/heme.  ? Transfuse in the interim Hgb 6.7 today, pt with + troponin and known cardiac ischemia on stress testing.    Will eventually need LHC when renal function stabilizes, add ASA    Continue coreg, cozaar, hydralazine, catapres.        Patient seen and examined, my assessment and plans as noted above.    Signed by    Ladell Pier, MD    Medications:      Scheduled Meds: PRN Meds:      amLODIPine 10 mg Oral Daily   carvedilol 25 mg Oral Q12H [place]   cloNIDine 0.1 mg Oral BID   famotidine 20 mg Oral Daily   heparin (porcine) 5,000 Units Subcutaneous Q12H [facility]   hydrALAZINE 100 mg Oral BID   insulin lispro 1-6 Units Subcutaneous QHS   insulin lispro 2-10 Units Subcutaneous TID AC   losartan 25 mg Oral Daily   simvastatin 10 mg Oral QHS       Continuous Infusions:  . nitroglycerin Stopped (05/17/16 0640)      dextrose 15 g of glucose PRN   And     dextrose 125 mL PRN   And     glucagon (rDNA) 1 mg PRN             Subjective:   Denies chest pain, SOB or palpitations, up to restroom this am without Dyspnea       Physical Exam:     Vitals:    05/18/16 0938   BP: 131/70    Pulse: 85   Resp: 16   Temp: 97.7 F (36.5 C)   SpO2: 98%     Temp (24hrs), Avg:98 F (36.7 C), Min:97.3 F (36.3 C), Max:99.5 F (37.5 C)    Weight Monitoring 10/01/2015 10/28/2015 05/16/2016 05/16/2016 05/16/2016 05/17/2016 05/18/2016   Height - 152.4 cm - - 154.9 cm - -   Height Method - Stated - - Estimated - -   Weight 72.893 kg 71.215 kg 75 kg 68.9 kg - 66.7 kg 66.452 kg   Weight Method - Stated Estimated Bed Scale - Bed Scale Bed Scale   BMI (calculated) - 30.7 kg/m2 - - - - -          Telemetry reviewed no changes.       Intake and Output Summary (Last 24 hours) at Date Time    Intake/Output Summary (Last 24 hours) at 05/18/16 1049  Last data filed at 05/18/16 0517   Gross per 24 hour   Intake  480 ml   Output             1320 ml   Net             -840 ml     General Appearance:  Breathing comfortable, no acute distress  Neck:  No carotid bruit or jugular venous distension, brisk carotid upstroke  Lungs:  Clear to auscultation throughout, no wheezes, rhonchi or rales, good respiratory effort   Heart:  S1, S2 normal, no S3, no S4, no murmur, PMI not displaced, no rub   Abdomen:  Soft, non-tender, positive bowel sounds, no hepatojugular reflux  Extremities:  No cyanosis, clubbing or edema  Pulses:  Equal radial pulses, 4/4 symmetric  Neurologic:  Alert and oriented x3, mood and affect normal    Labs:     Recent Labs  Lab 05/16/16  1850 05/16/16  1221 05/16/16  0522   Creatine Kinase (CK) 235*  --   --    Troponin I 7.24* 8.03* 0.05           Recent Labs  Lab 05/16/16  1221   Cholesterol 186   Triglycerides 153*   HDL 47   LDL Calculated 108*       Recent Labs  Lab 05/17/16  0351   Bilirubin, Total 0.2   Bilirubin, Direct 0.1   Protein, Total 4.6*   Albumin 2.1*   ALT 15   AST (SGOT) 39*       Recent Labs  Lab 05/18/16  0651   Magnesium 1.9       Recent Labs  Lab 05/17/16  0458   PT 14.4   PT INR 1.1   PTT 39*       Recent Labs  Lab 05/18/16  0651 05/17/16  1236 05/17/16  0458 05/17/16  0351   WBC  5.38 5.48  --  6.14   Hgb 6.7* 7.1* 6.0* 6.2*   Hematocrit 20.9* 22.0* 19.0* 19.1*   Platelets 202 215  --  185       Recent Labs  Lab 05/18/16  0651 05/17/16  0351 05/16/16  0522   Sodium 139 134* 134*   Potassium 3.9 3.7 4.6   Chloride 107 105 106   CO2 22 21* 19*   BUN 32.2* 31.8* 38.2*   Creatinine 2.3* 2.0* 2.1*   EGFR 21.4 25.1 23.7   Glucose 148* 119* 195*   Calcium 8.3* 8.5 8.8       Recent Labs  Lab 05/16/16  1221   Thyroid Stimulating Hormone 1.02     Estimated Creatinine Clearance: 21.9 mL/min (based on SCr of 2.3 mg/dL).      Lab Results   Component Value Date    BNP 296.6 (H) 05/16/2016      Imaging:         Signed by: Lorelee Cover, [place]  NP [place] (8am-5pm)  MD Spectralink [phone] (8am-5pm)  After hours, non urgent consult line [phone]  After Hours, urgent consults [phone]

## 2016-05-18 NOTE — Progress Notes (Signed)
Started blood transfusion, vitals were stable. Will continue to monitor.

## 2016-05-19 LAB — CBC
Absolute NRBC: 0 10*3/uL
Hematocrit: 32.1 % — ABNORMAL LOW (ref 37.0–47.0)
Hgb: 10.6 g/dL — ABNORMAL LOW (ref 12.0–16.0)
MCH: 22.6 pg — ABNORMAL LOW (ref 28.0–32.0)
MCHC: 33 g/dL (ref 32.0–36.0)
MCV: 68.3 fL — ABNORMAL LOW (ref 80.0–100.0)
MPV: 10.9 fL (ref 9.4–12.3)
Nucleated RBC: 0 /100 WBC (ref 0.0–1.0)
Platelets: 207 10*3/uL (ref 140–400)
RBC: 4.7 10*6/uL (ref 4.20–5.40)
RDW: 17 % — ABNORMAL HIGH (ref 12–15)
WBC: 6.25 10*3/uL (ref 3.50–10.80)

## 2016-05-19 LAB — BASIC METABOLIC PANEL
Anion Gap: 9 (ref 5.0–15.0)
BUN: 30.9 mg/dL — ABNORMAL HIGH (ref 7.0–19.0)
CO2: 23 mEq/L (ref 22–29)
Calcium: 8.5 mg/dL (ref 8.5–10.5)
Chloride: 105 mEq/L (ref 100–111)
Creatinine: 2.1 mg/dL — ABNORMAL HIGH (ref 0.6–1.0)
Glucose: 125 mg/dL — ABNORMAL HIGH (ref 70–100)
Potassium: 4 mEq/L (ref 3.5–5.1)
Sodium: 137 mEq/L (ref 136–145)

## 2016-05-19 LAB — MAGNESIUM: Magnesium: 1.8 mg/dL (ref 1.6–2.6)

## 2016-05-19 LAB — GLUCOSE WHOLE BLOOD - POCT
Whole Blood Glucose POCT: 140 mg/dL — ABNORMAL HIGH (ref 70–100)
Whole Blood Glucose POCT: 226 mg/dL — ABNORMAL HIGH (ref 70–100)
Whole Blood Glucose POCT: 143 mg/dL — ABNORMAL HIGH (ref 70–100)

## 2016-05-19 LAB — GFR: EGFR: 23.7

## 2016-05-19 MED ORDER — LOSARTAN POTASSIUM 25 MG PO TABS
25.0000 mg | ORAL_TABLET | Freq: Every day | ORAL | 0 refills | Status: DC
Start: 2016-05-19 — End: 2016-06-19

## 2016-05-19 MED ORDER — HYDRALAZINE HCL 100 MG PO TABS
100.0000 mg | ORAL_TABLET | Freq: Two times a day (BID) | ORAL | 0 refills | Status: DC
Start: 2016-05-19 — End: 2016-06-19

## 2016-05-19 MED ORDER — AMLODIPINE BESYLATE 10 MG PO TABS
10.0000 mg | ORAL_TABLET | Freq: Every day | ORAL | Status: DC
Start: 2016-05-19 — End: 2016-08-10

## 2016-05-19 NOTE — Plan of Care (Signed)
Problem: Safety  Goal: Patient will be free from injury during hospitalization   05/19/16 1115   Goal/Interventions addressed this shift   Patient will be free from injury during hospitalization  Assess patient's risk for falls and implement fall prevention plan of care per policy;Provide and maintain safe environment;Hourly rounding;Include patient/ family/ care giver in decisions related to safety     Patient ambulated with non-skid socks on and one person assistance to the bathroom with a little unsteady gait.  Placed bed alarm on with call bell and personal belongings within reach.    Problem: Discharge Barriers  Goal: Patient will be discharged home or other facility with appropriate resources  Outcome: Progressing   05/19/16 1115   Goal/Interventions addressed this shift   Discharge to home or other facility with appropriate resources Provide appropriate patient education;Initiate discharge planning     Patient educated and answered questions on current medications and preparing for discharge.

## 2016-05-19 NOTE — Progress Notes (Signed)
Home Health Referral          Referral from [facility] (Case Manager) for home health care upon discharge.    By [employer], the patient has the right to freely choose a home care provider.  Arrangements have been made with:     A company of the patients choosing. We have supplied the patient with a listing of providers in your area who asked to be included and participate in Medicare.   [facility], a home care agency that provides both adult home care services which is a wholly owned and operated by [employer] and participates in [employer]   The preferred provider of your insurance company. Choosing a home care provider other than your insurance company's preferred provider may affect your insurance coverage.    The Home Health Care Referral Form acknowledging the voluntary selection of the home care company has been completed, signed, and is on file.      Home Health Discharge Information     Your doctor has ordered Skilled Nursing and Physical Therapy in-home service(s) for you while you recuperate at home, to assist you in the transition from hospital to home.      The agency that you or your representative chose to provide the service:  Name of [place]: [place]. ([phone])]       The above services were set up by:  Theador Hawthorne  [facility] Liaison)   Phone      [phone]                                       Additional comments:   If you have not heard from your home health agency within 24-48 hours after discharge please call your agency to arrange a time for your first visit.  For any scheduling concerns or questions related to home health, such as time or date please contact your home health agency at the number listed above.     Signed by: Theador Hawthorne RN, BSN  Date Time: 05/19/16 1:10 PM

## 2016-05-19 NOTE — Progress Notes (Signed)
[facility].  Progress Note    Assessment:   Hypertensive urgency- BP better   AKI vs CKD   DM   Proteinuria   Severe anemia   Renovascular hypertension    Plan:   Agree with d/c plans   Needs OP f/u    D/W daughter.    Queen Slough, MD  Office [phone]  ++++++++++++++++++++++++++++++++++++++++++++++++++++++++++++++  Subjective:  No new complaints    Medications:  Scheduled Meds:  Current Facility-Administered Medications   Medication Dose Route Frequency   . amLODIPine  10 mg Oral Daily   . aspirin  81 mg Oral Daily   . carvedilol  25 mg Oral Q12H [facility]   . cloNIDine  0.1 mg Oral BID   . darbepoetin alfa  60 mcg Subcutaneous Weekly   . famotidine  20 mg Oral Daily   . heparin (porcine)  5,000 Units Subcutaneous Q12H [facility]   . hydrALAZINE  100 mg Oral BID   . insulin lispro  1-6 Units Subcutaneous QHS   . insulin lispro  2-10 Units Subcutaneous TID AC   . losartan  25 mg Oral Daily   . simvastatin  10 mg Oral QHS     Continuous Infusions:     PRN Meds:sodium chloride, Nursing communication: Adult Hypoglycemia Treatment Algorithm **AND** dextrose **AND** dextrose **AND** glucagon (rDNA) **AND** Nursing communication: Document Significant Event Note    Objective:  Vital signs in last 24 hours:  Temp:  [98 F (36.7 C)-99.1 F (37.3 C)] 98.8 F (37.1 C)  Heart Rate:  [67-80] 68  Resp Rate:  [12-22] 16  BP: (145-179)/(68-90) 145/69    Intake/Output from yesterday (07:01 - 07:00):  09/14 0701 - 09/15 0700  In: 851.66   Out: -      Physical Exam:   Gen: WD WN NAD   CV: S1 S2 N RRR   Chest: CTAB   Ab: ND NT soft no HSM +BS   Ext: No C/E    Labs:    Recent Labs  Lab 05/19/16  0511 05/18/16  0651 05/17/16  0351  05/16/16  0522   Glucose 125* 148* 119*  --  195*   BUN 30.9* 32.2* 31.8*  --  38.2*   Creatinine 2.1* 2.3* 2.0*  --  2.1*   Calcium 8.5 8.3* 8.5  --  8.8   Sodium 137 139 134*  --  134*   Potassium 4.0 3.9 3.7  --  4.6   Chloride 105 107 105  --  106   CO2 23 22 21*   --  19*   Albumin  --   --  2.1*  --  2.6*   Magnesium 1.8 1.9 1.8 More results in Results Review  --    More results in Results Review = values in this interval not displayed.    Recent Labs  Lab 05/19/16  0511 05/18/16  0651 05/17/16  1236   WBC 6.25 5.38 5.48   Hgb 10.6* 6.7* 7.1*   Hematocrit 32.1* 20.9* 22.0*   MCV 68.3* 64.3* 64.7*   MCH 22.6* 20.6* 20.9*   MCHC 33.0 32.1 32.3   RDW 17* 15 15   MPV 10.9 10.8 10.6   Platelets 207 202 215

## 2016-05-19 NOTE — Discharge Instr - Lab (Signed)
Home Health Discharge Information     Your doctor has ordered Skilled Nursing and Physical Therapy in-home service(s) for you while you recuperate at home, to assist you in the transition from hospital to home.      The agency that you or your representative chose to provide the service:  Name of [place]: [place]. ([phone])]       The above services were set up by:  Ricki Rodriguez [facility]  [facility] Liaison)       Additional comments:   If you have not heard from your home health agency within 24-48 hours after discharge please call your agency to arrange a time for your first visit.  For any scheduling concerns or questions related to home health, such as time or date please contact your home health agency at the number listed above.     Signed by: Theador Hawthorne RN, BSN  Date Time: 05/19/16 1:10 PM

## 2016-05-19 NOTE — Plan of Care (Signed)
Problem: Safety  Goal: Patient will be free from injury during hospitalization  Outcome: Progressing      Problem: Pain  Goal: Pain at adequate level as identified by patient  Outcome: Progressing   05/19/16 0314   Goal/Interventions addressed this shift   Pain at adequate level as identified by patient Identify patient comfort function goal;Offer non-pharmacological pain management interventions;Consult/collaborate with Pain Service       Problem: Psychosocial and Spiritual Needs  Goal: Demonstrates ability to cope with hospitalization/illness  Outcome: Progressing   05/19/16 0314   Goal/Interventions addressed this shift   Demonstrates ability to cope with hospitalizations/illness Encourage verbalization of feelings/concerns/expectations;Provide quiet environment;Assist patient to identify own strengths and abilities;Encourage patient to set small goals for self;Encourage participation in diversional activity;Reinforce positive adaptation of new coping behaviors

## 2016-05-19 NOTE — Progress Notes (Addendum)
Second blood unit started at 9:24pm and finished 00:35am. V/S stable and no c/o side effects.  Will continue to moniotor

## 2016-05-19 NOTE — PT Progress Note (Signed)
Physical Therapy Cancellation Note    Patient: Jacqueline Weber  HNP:[id number]    Unit: M208/M208-B    Patient not seen for physical therapy secondary to pt receiving blood transfusion. P: will follow as appropriate.    Wynona Meals  PM&R  [phone]

## 2016-05-19 NOTE — Plan of Care (Signed)
Medication Name: Losartan (Cozaar)   This Medication is used for:   Blood Pressure   Congestive Heart Failure  Common Side Effects are:   Dizziness   Low Blood Pressure    Increased Potassium  A note from your nurse:  Call your nurse immediately if you notice itching, hives, swelling or trouble breathing

## 2016-05-19 NOTE — Discharge Instr - AVS First Page (Addendum)
Reason for your Hospital Admission:  Hypertensive Emergency  Acute Kidney Injury      Instructions for after your discharge:      Follow with Primary MD Laureen Ochs, PA in 7 days     Get CBC, CMP, checked in 7 days by Primary MD and again as instructed by your Primary MD. Get Medicines reviewed and adjusted.    Please request your Prim.MD to go over all Hospital Tests and Procedure/Radiological results at the follow up, please get all Hospital records sent to your Prim MD by signing hospital release before you go home.    Follow with Dr Wenda Overland (blood doctor) in 2 weeks.  With Dr. Kate Sable (or one of his partner nephrologist) in 1-2 weeks.  With Cardiology [facility]) in 1 week.    Activity: Fall precautions use walker/cane & assistance as needed      Do not drive when taking Pain medications.        Diet: ___Heart Healthy___    Disposition HOME    If you experience worsening of your admission symptoms, develop shortness of breath, life threatening emergency, suicidal or homicidal thoughts you must seek medical attention immediately by calling 911 or calling your MD immediately  if symptoms less severe.    Do not take more than prescribed Pain, Sleep and Anxiety Medications    Special Instructions: If you have smoked or chewed Tobacco  in the last 2 yrs please stop smoking, stop any regular Alcohol  and or any Recreational drug use.    You Must read complete instructions/literature along with all the possible adverse reactions/side effects for all the Medicines you take and that have been prescribed to you. Take any new Medicines after you have completely understood and accepted all the possible adverse reactions/side effects.

## 2016-05-19 NOTE — Consults (Signed)
Brief Nutrition Note:    Nutrition consult received from ICU MD; spoke with current attending who reports pt is scheduled for D/C today.  Chart reviewed; no acute nutritional issues at this time.  Will continue to follow as per protocol.    Thresa Ross MS RD

## 2016-05-19 NOTE — Progress Notes (Signed)
Per OT request this CM provided the walker to the patient.

## 2016-05-19 NOTE — Discharge Summary (Signed)
[facility] HOSPITALIST   [place] Summary   Patient Info:   Date/Time: 05/19/2016 / 4:52 PM   Admit Date:05/16/2016  Patient Name:Jacqueline Weber   PCH:[id number]   PCP: Laureen Ochs, PA  Attending Physician: Stacey Drain, MD     Hospital Course:   Please see H&P for complete details of HPI and ROS. The patient was admitted to [facility] and has been diagnosed with Hospital Problems:  Active Problems:    Hypertensive emergency   and has been taken care as mentioned below.     1.  Hypertensive emergency-patient was admitted on September 12 secondary to shortness of breath.  Due to the significantly high blood pressure, patient was taken to the ICU.  She was placed on a nitroglycerin drip.  Nephrology was consulted.  Patient was transitioned to an oral regimen.  Patient's blood pressure was under adequate control for discharge.  She will follow up closely with nephrology.  Patient had a renal duplex that showed an elevated renal index.  Spoke with Dr. Kate Sable today, and he will follow this up as an outpatient.  Her hydralazine was increased to 100 mg twice a day.   2.  Non-ST elevation myocardial infarction-patient's troponin peaked 8.  Patient does need a cardiac catheterization previously, she has had an abnormal stress test previously.  However, due to her renal disease and recent blood transfusion, this was delayed until she was an outpatient.  Patient will continue aspirin and a statin.  She will follow up closely with cardiology.  They saw the patient today, and cleared for discharge.  She will have follow-up within for 3-5 days.   3.  Anemia-may be related to her renal function, but patient does need a further workup as an outpatient.  Hematology/oncology saw the patient, and determined that the likely source was her renal disease, but she does need outpatient follow-up for this.  She did receive 2 units of packed red blood cells.  She needs a complete blood count in one week.   4.  Acute on  chronic kidney injury-patient's creatinine was a bit higher yesterday.  However, it is improving today.  Dr. Kate Sable was following along during the hospitalization, and this is her likely baseline.  She will follow-up with him in the next 1-2 weeks.   5.  Hyperlipidemia-patient simvastatin can be continued as an outpatient.   6.  Diabetes mellitus-her metformin shall be held as an outpatient due to chronic kidney disease.  Patient's glucose was relatively stable in the hospital.  She has follow-up with the discharge clinic on Monday.      Consultants:  Hematology/oncology [facility])  Critical care ([place])  Cardiology [facility] heart)  Nephrology Kate Sable)    Disposition:home  Condition at Discharge and Prognosis: Fair/stable  Admission Date:05/16/2016  Discharge Date: 05/19/16   Code Status: Full Code     Subjective at the time of discharge:     Patient without complaints of shortness of breath or chest pain today.  Ambulating around the unit without issue.  Chief Complaint:  Shortness of Breath and Chest Pain    Objective:     Vitals:    05/19/16 0809 05/19/16 1003 05/19/16 1140 05/19/16 1357   BP: 179/77 159/68 147/73 145/69   Pulse: 80 72 70 68   Resp:  16  16   Temp:  98.1 F (36.7 C)  98.8 F (37.1 C)   TempSrc:    Oral   SpO2:  95%  96%  Weight:       Height:         Physical Exam:   Physical Exam   Constitutional: She is oriented to person, place, and time. No distress.   HENT:   Head: Normocephalic and atraumatic.   Mouth/Throat: No oropharyngeal exudate.   Eyes: Conjunctivae and EOM are normal. No scleral icterus.   Neck: Neck supple. No tracheal deviation present.   Cardiovascular: Normal rate and regular rhythm.    No murmur heard.  Pulmonary/Chest: Effort normal and breath sounds normal. No respiratory distress. She has no wheezes.   Abdominal: Soft. Bowel sounds are normal. She exhibits no distension. There is no tenderness.   Musculoskeletal: Normal range of motion. She exhibits no edema.    Neurological: She is alert and oriented to person, place, and time. GCS score is 15.   Skin: Skin is warm and dry. No rash noted. She is not diaphoretic. No erythema.     Clinical Presentation:   History of Presenting Illness: Please refer to HPI in the Detailed H&P  Discharge Diagnosis and Instructions:   Hospital Problems:Active Problems:    Hypertensive emergency    Lists the present on admission hospital problems:Present on Admission:  . Hypertensive emergency    Consultants:Plan  IP CONSULT TO NUTRITION SERVICES  IP CONSULT TO NEPHROLOGY  IP CONSULT TO CARDIOLOGY  IP CONSULT TO SOCIAL WORK  IP CONSULT TO CARDIOLOGY  IP CONSULT TO [place] FACE-TO-FACE (FTF) ENCOUNTER  Discharge Medications:   Discharge Medications:      Discharge Medication List      Taking    amLODIPine 10 MG tablet  Dose:  10 mg  What changed:   medication strength   how much to take  Commonly known as:  NORVASC  Take 1 tablet (10 mg total) by mouth daily.     aspirin EC 325 MG EC tablet  Dose:  325 mg  Take 1 tablet (325 mg total) by mouth daily.     carvedilol 25 MG tablet  Dose:  25 mg  Commonly known as:  COREG  Take 25 mg by mouth 2 (two) times daily with meals.     cloNIDine 0.1 MG tablet  Dose:  0.1 mg  Commonly known as:  CATAPRES  Take 0.1 mg by mouth 2 (two) times daily.     famotidine 20 MG tablet  Dose:  20 mg  Commonly known as:  PEPCID  Take 1 tablet (20 mg total) by mouth daily.     fluticasone 50 MCG/ACT nasal spray  Dose:  1 spray  Commonly known as:  FLONASE  1 spray by Nasal route daily.     hydrALAZINE 100 MG tablet  Dose:  100 mg  What changed:   medication strength   how much to take  Commonly known as:  APRESOLINE  Take 1 tablet (100 mg total) by mouth 2 (two) times daily.     losartan 25 MG tablet  Dose:  25 mg  Commonly known as:  COZAAR  Take 1 tablet (25 mg total) by mouth daily.     simvastatin 10 MG tablet  Dose:  10 mg  Commonly known as:  ZOCOR  Take 10 mg by mouth nightly.     vitamin D  (ergocalciferol) 50000 UNIT Caps  Dose:  2000 Units  Commonly known as:  DRISDOL  Take 2,000 Units by mouth 2 (two) times daily.        STOP taking these  medications    metFORMIN 1000 MG tablet  Commonly known as:  GLUCOPHAGE          Follow up recommendations:   Follow up:   Follow-up Information     Laureen Ochs, PA Follow up on 06/02/2016.    Specialty:  Physician Assistant  Contact information:  [place] [address]  [phone]             Queen Slough, MD Follow up in 1 week(s).    Specialties:  Nephrology, Internal Medicine  Contact information:  [place] [address]  [phone]             Ladell Pier, MD Follow up in 1 week(s).    Specialties:  Cardiology, Internal Medicine  Contact information:  [address]  [place] [address]  [phone]             Vernell Morgans Elly Modena., MD Follow up in 2 week(s).    Specialties:  Medical Oncology, Internal Medicine  Contact information:  [address]  [place] [address]  [phone]             [facility] Follow up on 05/22/2016.    Why:  9:30 for follow up in [place].  Contact information:  [address]  [place]  [place] [address]  Leslie Follow up in 2 day(s).    Why:  Home Health Visits  Contact information:  [address]  [address]  [place] [address]  [phone]               Results of Labs/imaging:   Labs have been reviewed:   Coagulation Profile:   Recent Labs  Lab 05/17/16  0458   PT 14.4   PT INR 1.1   PTT 39*       CBC review:   Recent Labs  Lab 05/19/16  0511 05/18/16  0651 05/17/16  1236 05/17/16  0458 05/17/16  0351 05/16/16  0522   WBC 6.25 5.38 5.48  --  6.14 11.85*   Hgb 10.6* 6.7* 7.1* 6.0* 6.2* 7.4*   Hematocrit 32.1* 20.9* 22.0* 19.0* 19.1* 24.0*   Platelets 207 202 215  --  185 262   MCV 68.3* 64.3* 64.7*  --  65.0* 66.5*   RDW 17* 15 15  --  15  16*   Neutrophils  --   --   --   --   --  72.5   Segmented Neutrophils  --   --  53  --   --   --    Lymphocytes Automated  --   --   --   --   --  19.6   Eosinophils Automated  --   --   --   --   --  2.9   Immature Granulocyte  --   --   --   --   --  0.3   Neutrophils Absolute  --   --   --   --   --  8.60*   Absolute Immature Granulocyte  --   --   --   --   --  0.04     Chem Review:  Recent Labs  Lab 05/19/16  0511 05/18/16  0651 05/17/16  0351 05/16/16  1221  05/16/16  0522   Sodium 137 139 134*  --  134*   Potassium 4.0 3.9 3.7  --  4.6   Chloride 105 107 105  --  106   CO2 23 22 21*  --  19*   BUN 30.9* 32.2* 31.8*  --  38.2*   Creatinine 2.1* 2.3* 2.0*  --  2.1*   Glucose 125* 148* 119*  --  195*   Calcium 8.5 8.3* 8.5  --  8.8   Magnesium 1.8 1.9 1.8 1.4*  --    Bilirubin, Total  --   --  0.2  --  0.1*   AST (SGOT)  --   --  39*  --  20   ALT  --   --  15  --  15   Alkaline Phosphatase  --   --  53  --  68     Results     Procedure Component Value Units Date/Time    Glucose Whole Blood - POCT [[id number]]  (Abnormal) Collected:  05/19/16 1635     Updated:  05/19/16 1645     POCT - Glucose Whole blood 143 (H) mg/dL     Glucose Whole Blood - POCT [[id number]]  (Abnormal) Collected:  05/19/16 1114     Updated:  05/19/16 1118     POCT - Glucose Whole blood 226 (H) mg/dL     Glucose Whole Blood - POCT [[id number]]  (Abnormal) Collected:  05/19/16 0732     Updated:  05/19/16 0736     POCT - Glucose Whole blood 140 (H) mg/dL     Basic Metabolic Panel [[id number]]  (Abnormal) Collected:  05/19/16 0511    Specimen:  Blood Updated:  05/19/16 0722     Glucose 125 (H) mg/dL      BUN 30.9 (H) mg/dL      Creatinine 2.1 (H) mg/dL      Calcium 8.5 mg/dL      Sodium 137 mEq/L      Potassium 4.0 mEq/L      Chloride 105 mEq/L      CO2 23 mEq/L      Anion Gap 9.0    Magnesium [[id number]] Collected:  05/19/16 0511    Specimen:  Blood Updated:  05/19/16 0722     Magnesium 1.8 mg/dL     GFR [[id number]] Collected:  05/19/16 0511      Updated:  05/19/16 0722     EGFR 23.7    CBC without differential [[id number]]  (Abnormal) Collected:  05/19/16 0511    Specimen:  Blood from Blood Updated:  05/19/16 0700     WBC 6.25 x10 3/uL      Hgb 10.6 (L) g/dL      Hematocrit 32.1 (L) %      Platelets 207 x10 3/uL      RBC 4.70 x10 6/uL      MCV 68.3 (L) fL      MCH 22.6 (L) pg      MCHC 33.0 g/dL      RDW 17 (H) %      MPV 10.9 fL      Nucleated RBC 0.0 /100 WBC      Absolute NRBC 0.00 x10 3/uL     Prepare RBC: two units [[id number]] Collected:  05/17/16 0458     Updated:  05/19/16 0017     RBC Leukoreduced RBC Leukoreduced     BLUNIT [id number]     Status transfused     PRODUCT  CODE (NON READABLE) [id number]     Expiration Date [id number]     UTYPE O POS     RBC Leukoreduced RBC Leukoreduced     BLUNIT [id number]     Status transfused     PRODUCT CODE (NON READABLE) [id number]     Expiration Date [id number]     UTYPE O POS    Glucose Whole Blood - POCT [[id number]]  (Abnormal) Collected:  05/18/16 2040     Updated:  05/18/16 2044     POCT - Glucose Whole blood 160 (H) mg/dL         Radiology reports have been reviewed:  Radiology Results (24 Hour)     Procedure Component Value Units Date/Time    US Renal Artery Duplx Dopp Complete [[id number]] Resulted:  05/18/16 1730    Order Status:  Sent Updated:  05/18/16 1753        Echocardiogram Adult Complete W Clr/ Dopp Waveform    Result Date: 05/17/2016  ECHOCARDIOGRAM Sonographer:  Laurena Bering Durohom Indications:  Hypertension Height (in):  61 Weight (lb):  247 Blood Pressure:  146/62   2-D Measurements Left Ventricle                                          Diastolic Dimension:  42  (08-02 mm) Systolic Dimension:  30  (25-40 mm)     Septal Diastolic Thickness:  12  (6-11 mm)    Posterior Wall Thickness:  13  (6-11 mm) Fractional Shortening Percentage:  29  (24-46 %)                       Visually Estimated Ejection Fraction:  65  (55-75 %)                        Right Ventricle Diastolic Dimension:  22  (7-26 mm)                            Left Atrium End Systolic Dimension:  38  (19-40 mm)                    Aortic Root:  28  (20-37 mm)                       Doppler Measurements and Color Flow Imaging Valves                                        Aortic Valve:  1.9  (0.9-1.8 m/s).         Regurgitation:  Mild to moderate Pulmonic Valve:  1  (0.6-0.9 m/s).     Regurgitation:  0 Mitral Valve:  1  (0.6-1.4 m/s).          Regurgitation:  Mild Tricuspid Valve:  0.4  (0.4-0.8 m/s.     Regurgitation:  Trace Left Ventricular Outflow Tract Velocity:  1.1 m/s. E/A Ratio:  0.8 Est. PASP:  39 Est. RA Mean Pressure:  5 LA Volume 78 (18-58 ml) LA Volume Index 47 (16-28 ml/m2) TAPSE 24 (> or =16 mm) Echocardiogram M-mode, 2D, spectral Doppler and color flow imaging were performed and interpreted.     1.  The quality of the study is technically adequate for interpretation. 2.  The left ventricle is normal in size. Left ventricular systolic function is normal. There are no regional wall motion abnormalities. Estimated EF is 65%. There is mild concentric left ventricular hypertrophy. There is evidence of abnormal diastolic LV function. 3.  The left atrium is moderately dilated in size. 4.  The aortic valve is trileaflet. Sclerotic aortic valve. Normal valve excursion is present. There is mild to moderate aortic insufficiency. No aortic stenosis is present. The aortic root is normal in size. 5.  The mitral valve is structurally normal. Mild mitral insufficiency is present. 6.  The right ventricle is normal in size and function. 7.  The right atrium is normal in size. 8. The tricuspid valve is structurally normal. There is no significant tricuspid insufficiency. 9. The pulmonic valve is structurally normal. There is no significant pulmonic insufficiency. 10. There is no evidence of pulmonary hypertension. 11. Trace pericardial effusion.. 12. The atrial septum is structurally normal. No shunt by color flow doppler. CONCLUSION: Left ventricle is  normal in size with normal systolic function. LVEF 65%. Diastolic dysfunction noted. Mild concentric left ventricular hypertrophy. Left atrium moderately dilated. Sclerotic aortic valve. Mild to moderate aortic regurgitation. Mild mitral regurgitation. Trace pericardial effusion Darnelle Spangle, MD 05/17/2016 4:23 PM     US Renal Kidney    Result Date: 05/16/2016  HISTORY: Acute kidney injury. COMPARISON: None FINDINGS: Portable renal ultrasound was performed. Right kidney: 8.8 cm in length Left kidney: 10.4 cm in length Bladder prevoid volume: 304 cc Right kidney is smaller than the left but without significant cortical thinning. Normal, echotexture and position. There is no hydronephrosis, renal mass or stone. No bladder wall thickening. Simple left midpole cyst measures 1.0 x 0.9 x 0.9 cm.     1. No hydronephrosis. 2. Asymmetric kidney size smaller on the right.. 3. Left renal simple cyst. Ardelia Mems, MD 05/16/2016 5:58 PM     Xr Chest Ap Portable    Result Date: 05/17/2016  CLINICAL HISTORY: respiratory distress COMPARISON: Chest x-ray dated 05/16/2016 AP view of the chest was performed. FINDINGS:    There remains mild pulmonary edema, predominantly right-sided, which appears mildly improved.  The heart size and bones are within normal limits.       Mild pulmonary edema, mildly improved. Maia Breslow, MD 05/17/2016 1:41 PM     Chest Ap Portable    Result Date: 05/16/2016  HISTORY: Shortness of breath Portable image of the chest shows enlarged cardiac pericardial silhouette. Vascular pattern is increased. Kerley B lines are present. Aortic contour is slightly tortuous. There is no focal consolidation, pleural effusion or pneumothorax.     1. Congestive failure. This is new when compared to 09/30/2015. Dierdre Searles, MD 05/16/2016 5:47 AM     Pathology:   Specimens     None        Pending Lab Results:   Labs/Images to be followed at your PCP office: CMP/CBC 1 week.    Hospitalist:   Signed by: Stacey Drain,  MD  05/19/2016 4:52 PM  Time spent for discharge: 35 minutes      *This note was generated by the Epic EMR system/ Dragon speech recognition and may contain inherent errors or omissions not intended by the user. Grammatical errors, random word insertions, deletions, pronoun errors and incomplete sentences are occasional consequences of this technology due to software limitations. Not all errors are caught or corrected. If there are  questions or concerns about the content of this note or information contained within the body of this dictation they should be addressed directly with the author for clarification.

## 2016-05-19 NOTE — Discharge Instructions (Signed)
Your High Blood Pressure Risk Factors  Risk factors are things that make you more likely to have a disease or condition. Do you know your risk factors for high blood pressure? You can't do anything about some risk factors. But other risk factors are things that can be changed. Know what high blood pressure risk factors you have. Then find out what changes you can make to help control your risk for high blood pressure.Start with the change that you think will be easiest for you.  Risk factors you can't control  Though you can't change any of the things listed below, check off the ones that apply to you. The more boxes you check, the greater your risk for high blood pressure.  Family history  ? One or both of your parents or grandparents has had high blood pressure or heart disease.  Genderand age  ? You're a man over age 62 or a postmenopausal woman.  Risk factors you can control  There are plenty of risk factors for high blood pressure that you can control. Learn what these risk factors are and then find out how to reduce your risk. Check the ones that apply to you.  ? What you eat  Do you eat a lot of salty, fatty, fried, or greasy foods? Do you go to restaurants or eat out frequently?  ? Alcohol consumption  Do you drink more than 1 drink a day if you are a female or more than 2 drinks a day if you female?  ? If you smoke or someone close to you smokes  Do you smoke cigarettes or cigars, chew tobacco, or dip snuff? Are you exposed to second-hand smoke on a regular basis?  ? How active you are  Are you inactive most of the time at work and at home? Do you go weeks without exercising?  ? Your weight  Has your doctor said that you are 15 or more pounds overweight?  ? Your stress level  Do you often feel anxious, nervous, and stressed? Do you feel that you don't have a supportive environment?  Date Last Reviewed: 12/30/2014   2000-2016 [employer], [place] [address]. All  rights reserved. This information is not intended as a substitute for professional medical care. Always follow your healthcare professional's instructions.      Medication Name: Losartan (Cozaar)   This Medication is used for:   Blood Pressure   Congestive Heart Failure  Common Side Effects are:   Dizziness   Low Blood Pressure    Increased Potassium  A note from your nurse:  Call your nurse immediately if you notice itching, hives, swelling or trouble breathing

## 2016-05-19 NOTE — OT Progress Note (Signed)
[facility]  [place]  [phone]    Occupational Therapy Treatment Note       Patient:  Jacqueline Weber MRN#:  [id number]  [facility] CARE UNIT [id number]/[id number]    Time of treatment: Start Time: 0933 Stop Time: 1002   Time Calculation (min): 29 min    OT Visit Number: 2    Precautions and Contraindications:  Falls Risk        Assessment:  Appears to be at baseline for ADL's; Patient has met all OT goals this date  Prognosis: Good;With family  Progress: Discontinue OT    Patient Goal  Patient Goal:  (Patient denies pain currently)      Plan: D/C Acute OT services     OT Plan  Risks/Benefits/POC Discussed with Pt/Family: With patient/family  Treatment Interventions: No skilled interventions needed at this time  Discharge Recommendation: Home with supervision;Home with home health OT  DME Recommended for Discharge: Grab bars;Shower chair;Four wheel walker (tub grab bar; HH shower hose)  OT Frequency Recommended: 3-4x/wk       ADL Goals  Patient will groom self: Goal met  Patient will dress lower body: Goal met  Patient will toilet: Goal met  Mobility and Transfer Goals  Pt will transfer bed to toilet: Goal met                         Based on today's session patient's discharge recommendation is the following: Discharge Recommendation: Home with supervision;Home with home health OT.      DME Recommended for Discharge: Grab bars;Shower chair;Four wheel walker (tub grab bar; HH shower hose)            Subjective: Patient's medical condition is appropriate for Occupational Therapy intervention at this time.  Patient is agreeable to participation in the therapy session. Family and/or guardian are agreeable to patient's participation in the therapy session. Nursing clears patient for therapy. Patient's daughter present for duration of session for education.   Pain Assessment  Pain Assessment: No/denies  pain    Objective:Observation of Patient/Vital Signs:  Patient is in bed with telemetry and peripheral IV in place.    Cognition/Neuro Status  Arousal/Alertness: Appropriate responses to stimuli  Attention Span: Appears intact  Following Commands: Follows one step commands without difficulty  Safety Awareness: minimal verbal instruction  Behavior: attentive;calm;cooperative    Functional Mobility  Supine to Sit Transfers: Modified Independent (w/ HOB elevated)  Sit to Stand Transfers: Supervision (from bed)  Stand to Sit Transfers: Supervision  Bed to Toilet Transfers: Supervision (w/ rw)  Toilet to Arm-chair: Supervision w/ rw  -verbal/tactile instructions for proper hand placement and pacing with all functional transfers for increased safety.       Self-care and Home Management  Grooming: Supervision;standing at sink;standing with assistive device;supervision/safety;wash/dry face;wash/dry hands;teeth care  LB Dressing: Supervision;sitting;edge of bed;Don/doff R sock;Don/doff L sock;Supervision/safety  Toileting: Supervision;perineal hygiene;clothing management down;clothing management up;grab bar use;supervison/safety  Functional Transfers: Supervision;toilet transfer;supervision/safety    Therapeutic Exercises:Patient guided thru and performed the following UB AROM exercises to faciltate increased endurance and strength for ADL's, positioned seated in arm-chair; 1 x 10 reps of each performed. Patient provided with verbal/visual instructions for proper pacing and execution of mvmts.       Shoulder AROM: Flexion;Extension;Sitting  Elbow AROM: Flexion;Extension  Treatment Activities: Patient instructed in proper pacing with transitional mvmts, encouraging Patient to wait atleast 60 secs. To notice how she feels (lightheaded or dizzy) before attempting to stand/walk for fall prevention. Patient verbalized understanding of same. Patient educated on safe rw use in small spaces in bathroom  including keeping rw infront of self upon approaching sinkside to engage in grooming tasks for increased safety. Pt. Instructed to perform B/L UE AROM exercises for shoulder, elbow and digit F/E intermittently throughout the day to increase endurance and strength for ADL's with visual demonstration provided to increase clarity. Patient issued written handout with visual aides of the above mentioned exercises to increase carry-over. Discussed with Patient and Patient's daughter home safety recommendations for bathroom use. Recommendations made for shower seat with back, hand-held shower hose, non-skid bathmat, tub grab bar for energy conservation and fall prevention.  Discussed with Patient's daughter and Patient importance of using rw upon d/c to home to ensure safety/fall prevention with functional mobility and for energy conservation. Also advised Patient to have Supervision with shower transfers and showering upon d/c to ensure safety. Patient provided with purchasing information on the above mentioned DME and was receptive to same. Patient and Patient's daughter appeared receptive to all education provided.         Educated the patient to role of occupational therapy, plan of care, goals of therapy and safety with mobility and ADLs, energy conservation techniques, home safety.    Patient left without needs and call bell within reach. Chair Alarm set.  RN notified of session outcome.        Hewitt Blade. Rigoberto Noel, MS,OTR/L  Pager # [phone]  [phone]

## 2016-05-19 NOTE — Progress Notes (Addendum)
[place]    Date Time: 05/19/16 3:16 PM  Patient Name: Jacqueline Weber       Patient Active Problem List   Diagnosis   . Type 2 diabetes mellitus without complication   . HTN (hypertension) urgency   . Hyperlipidemia   . History of CVA (cerebrovascular accident)   . Chest pain   . SOB (shortness of breath)   . Nausea   . Elevated d-dimer   . Hypertensive emergency       Assessment:      Hypertensive crisis, CHF, chest pain, improved, off IV NTG at this point -> improved, now on oral meds   Troponin up to 8.03 (peak)   Recent outpatient Chest pain with abnormal nuclear stress test findings, February 2017, nuclear stress test, small-sized, mild intensity inferoapical ischemia, EF 72%  ? Pt set up for outpatient LHC but cancelled due to elevated creat. (1.9 as outpatient, 2.1 inpatient)   Aug 2017, echo, EF 65%, concentric LVH, grade I diastolic dysfunction, mild- mod AI, mild MR.   Hypertension   Hyperlipidemia, 09/2015, TC 148, TRIG 74, HDL 49, LDL 84 (all at goal).   Anemia- improved after transfusions/darepoetin.    AKI vs CKD.    Recommendations:    Ambulate in hallways. Patient remains cp free.    Continue coreg, losartan,amlodipine, hydralazine, catapress for HTN.   Continue asa, coreg, statin therapy for presumed CAD.   Daughter and patient are aware that should she have any recurrent symptoms of chest pain or SOB, to seek medical attention. Discussed underlying CAD and cath deferred due to anemia requiring transfusion yesterday as well as CKD.   Will arrange f/u in the office 3-5 days.    Eventual LHC when renal function stabilizes, H/H improves     Medications:      Scheduled Meds: PRN Meds:      amLODIPine 10 mg Oral Daily   aspirin 81 mg Oral Daily   carvedilol 25 mg Oral Q12H [place]   cloNIDine 0.1 mg Oral BID   darbepoetin alfa 60 mcg Subcutaneous Weekly   famotidine 20 mg Oral Daily   heparin (porcine) 5,000 Units Subcutaneous Q12H SCH    hydrALAZINE 100 mg Oral BID   insulin lispro 1-6 Units Subcutaneous QHS   insulin lispro 2-10 Units Subcutaneous TID AC   losartan 25 mg Oral Daily   simvastatin 10 mg Oral QHS       Continuous Infusions:      sodium chloride  PRN   dextrose 15 g of glucose PRN   And     dextrose 125 mL PRN   And     glucagon (rDNA) 1 mg PRN             Subjective:   Daughter in at bedside to assist with translation. No chest pain, SOB or palpitations.      Physical Exam:     Vitals:    05/19/16 1357   BP: 145/69   Pulse: 68   Resp: 16   Temp: 98.8 F (37.1 C)   SpO2: 96%     Temp (24hrs), Avg:98.3 F (36.8 C), Min:98 F (36.7 C), Max:99.1 F (37.3 C)      Telemetry reviewed: NSR     Intake and Output Summary (Last 24 hours) at Date Time    Intake/Output Summary (Last 24 hours) at 05/19/16 1516  Last data filed at 05/19/16 0035   Gross per 24 hour  Intake           851.66 ml   Output                0 ml   Net           851.66 ml       General Appearance:  Breathing comfortable, no acute distress  Head:  normocephalic  Neck:  No jugular venous distension, brisk carotid upstroke  Lungs:  Clear to auscultation throughout, no wheezes, rhonchi or rales, good respiratory effort   Heart:  S1, S2 normal, no S3, no S4, no murmur, PMI not displaced, no rub   Abdomen:  Soft, non-tender, positive bowel sounds, no hepatojugular reflux  Extremities:  No edema  Pulses:  Equal radial pulses, 4/4 symmetric  Neurologic:  Alert and oriented x3, mood and affect normal  Musculoskeletal: normal strength and tone    Labs:     Recent Labs  Lab 05/16/16  1850 05/16/16  1221 05/16/16  0522   Creatine Kinase (CK) 235*  --   --    Troponin I 7.24* 8.03* 0.05           Recent Labs  Lab 05/16/16  1221   Cholesterol 186   Triglycerides 153*   HDL 47   LDL Calculated 108*       Recent Labs  Lab 05/17/16  0351   Bilirubin, Total 0.2   Bilirubin, Direct 0.1   Protein, Total 4.6*   Albumin 2.1*   ALT 15   AST (SGOT) 39*       Recent Labs  Lab 05/19/16  0511    Magnesium 1.8       Recent Labs  Lab 05/17/16  0458   PT 14.4   PT INR 1.1   PTT 39*       Recent Labs  Lab 05/19/16  0511 05/18/16  0651 05/17/16  1236   WBC 6.25 5.38 5.48   Hgb 10.6* 6.7* 7.1*   Hematocrit 32.1* 20.9* 22.0*   Platelets 207 202 215       Recent Labs  Lab 05/19/16  0511 05/18/16  0651 05/17/16  0351   Sodium 137 139 134*   Potassium 4.0 3.9 3.7   Chloride 105 107 105   CO2 23 22 21*   BUN 30.9* 32.2* 31.8*   Creatinine 2.1* 2.3* 2.0*   EGFR 23.7 21.4 25.1   Glucose 125* 148* 119*   Calcium 8.5 8.3* 8.5       Recent Labs  Lab 05/16/16  1221   Thyroid Stimulating Hormone 1.02       .  Lab Results   Component Value Date    BNP 296.6 (H) 05/16/2016      Estimated Creatinine Clearance: 23.9 mL/min (based on SCr of 2.1 mg/dL).    Weight Monitoring 10/28/2015 05/16/2016 05/16/2016 05/16/2016 05/17/2016 05/18/2016 05/19/2016   Height 152.4 cm - - 154.9 cm - - -   Height Method Stated - - Estimated - - -   Weight 71.215 kg 75 kg 68.9 kg - 66.7 kg 66.452 kg 66.543 kg   Weight Method Stated Estimated Bed Scale - Bed Scale Bed Scale Bed Scale   BMI (calculated) 30.7 kg/m2 - - - - - -         Imaging:   Radiological Procedure reviewed.              Signed by: Laban Emperor, NP    Patient seen and  examined, my assessment and plans as noted above.    Signed by    Aleen Campi, MD      [facility]  NP [place] (8am-5pm)  MD Spectralink [phone] (8am-5pm)  After hours, non urgent consult line [phone]  After Hours, urgent consults [phone]

## 2016-05-22 ENCOUNTER — Encounter (INDEPENDENT_AMBULATORY_CARE_PROVIDER_SITE_OTHER): Payer: Self-pay | Admitting: "Endocrinology

## 2016-05-22 ENCOUNTER — Ambulatory Visit (INDEPENDENT_AMBULATORY_CARE_PROVIDER_SITE_OTHER): Payer: Medicare Other | Admitting: "Endocrinology

## 2016-05-22 ENCOUNTER — Ambulatory Visit (INDEPENDENT_AMBULATORY_CARE_PROVIDER_SITE_OTHER): Payer: Medicare Other

## 2016-05-22 ENCOUNTER — Telehealth (INDEPENDENT_AMBULATORY_CARE_PROVIDER_SITE_OTHER): Payer: Self-pay

## 2016-05-22 VITALS — BP 164/72 | HR 67 | Temp 98.5°F | Resp 14 | Ht 60.98 in | Wt 142.6 lb

## 2016-05-22 DIAGNOSIS — E119 Type 2 diabetes mellitus without complications: Secondary | ICD-10-CM

## 2016-05-22 LAB — POCT GLUCOSE: Whole Blood Glucose POCT: 190 mg/dL — AB (ref 70–100)

## 2016-05-22 MED ORDER — SITAGLIPTIN PHOSPHATE 25 MG PO TABS
25.0000 mg | ORAL_TABLET | Freq: Every day | ORAL | 3 refills | Status: DC
Start: 2016-05-22 — End: 2016-08-10

## 2016-05-22 NOTE — Telephone Encounter (Signed)
tC from daughter. Left voice mail to inform patient's blood pressure 145/69 . TC back to patient, left voice mail to encourage to continue with current bp medication regimen and to call back if bp goes below parameters given (below 100)

## 2016-05-22 NOTE — Progress Notes (Signed)
Nurse case coordinator introduce self to patient and Case coordinating services from [facility]. Patient verb und in role  Dispo: Meeting with patient to discuss transition /disposition plan with patient. [facility] but now got Medicare insurace.. Provided information regarding [facility] and [facility] to assist in selecting a new PCP.  PATIENT EDUCATION    1. Education was provided on the following: diabetes/High Blood Pressure    2. Was teach-back completed? Yes    Summary:  Patient instructed in the correct way to check blood sugar. Verbal and demonstration.Reinforced the importance to bring log to appt during next visit.Blood Sugar log given to patient.  Patient instructed in the correct way to check blood Pressure. Verbal and demonstration.Reinforced the importance to bring log to appt during next visit. Blood Pressure log given to patient.          All questions answered.Patient verb und and agreed with plan of care.

## 2016-05-22 NOTE — Patient Instructions (Addendum)
1) Start Januvia 25 mg. 1 tablet by mouth once daily for your diabetes.     1) Continue to take all your other medications as prescribed.     2) Check your blood pressure before you take your blood pressure medications, then recheck again 1 to 2 hours after you take your blood pressure medications.  Record your BP at home.  Please call our office if your BP is above 160/90 or below 100/60.     3) ---  Check your blood sugar twice daily: before breakfast and 2 hours after dinner    --- Record readings in your blood sugar log, bring this log when you come for your next visit    --- Your blood sugar goal based on:     American Diabetes Association Recommendations    A1c* < 7.0%   Before Meal Glucose Level 70-130 mg/dl   After Meal Glucose Level < 180 mg/dl       4) Hypoglycemia (low blood sugar) and hyperglycemia (high blood sugar) instructions  Symptoms of Hypoglycemia (low blood sugars)                      Feeling shaky, weak or hungry                      Dizziness or lightheadedness            Numbness and tingling of the lips                  Sweating                                                        Headaches                                                    Problems with vision    What to do if you experience hypoglycemia (low blood sugars)  Eat or drink something with 15 grams of carbs  1/2 cup of fruit juice e.g orange juice  1/2 cup of soft drink (avoid diet drinks)  Glucose tablets   1 tablespoon of honey    Symptoms of Hyperglycemia  (high blood sugars)   Feeling tired or fatigued  Increased thirst  Frequent urination  Blurred vision  Headaches    6) Please schedule appointment with the Cardiologist, Nephrologist and Hematologist as soon as possible.

## 2016-05-22 NOTE — Progress Notes (Signed)
History of Present Illness:     This patient is a 65 y.o. female, PMH significant for DM, HTN, HLD, and CVA, here for her initial ITS visit after her recent hospitalization at [facility], 9/12 to 05/19/16 for the following: NSTEMI, trop peaked to 8, abnormal stress test, cardiac cath as outpatient due to decreased renal function and recent blood transfusion for anemia;  hypertensive urgency associated with SOB, managed on NTG drip, subsequently transitioned to oral regimen; renal US showed elevated renal index, to f/u with Nephrology, Dr. Kate Sable as outpatient; anemia which was thought to be secondary to renal disease, seen by Hematology, had 2 units PRBC; HLD, on Simvastatin; T2DM with relatively stable BS in the hospital, Metformin d/c'd due to renal disease.      Patient presents to clinic today with her daughter, Arna Snipe, who is assisting in interpretation per patient preference:       1) NSTEMI   -- denies chest pain, palpitation, SOB or DOE  -- current meds: Aspirin 325 mg daily, Coreg 25 mg BID and Losartan 25 mg daily  -- echo: EF 65 %, diastolic dysfunction present, trace pleural effusion  -- schedule appt with Cardiology Dr. Annamary Rummage for cardiac cath - patient's daughter will call today to schedule      2) HTN  -- POCT BP 185/82, did not take any of her anti hypertensive medications yet  -- no headache, neck pain or blurry vision; as above no chest pain or palpitations  -- feels dizzy in the morning after she takes her BP medications.   -- current meds: Coreg 25 mg BID, Losartan 25 mg daily, Amlodipine 10 mg daily, Hydralazine 100 mg BID and Clonidine 0.1 mg BID    3) HLD  -- current med: Simvastatin 10 mg daily    4) Hx of CVA  -- date of onset: 3 to 4 years ago   -- currently no generalized or focal weakness  -- no residual deficits after the stroke      5) Renal disease  -- Crea 2.1; EGFR 23.7  -- no weakness, or peripheral edema    6) Anemia  -- H/H 10.6/32.1  -- no unusual  weakness or fatigue    7) T2DM   -- diagnosed 3 to 4 years ago  -- A1C on May 2016 was 6.7    -- POCT 1 hour post prandial BS 190  -- verbal report of home BS: fasting BS 140 to 144; does not check post prandial   -- her Metformin was d/c'd in the hospital due to decreased renal function    Review of Systems:     Review of Systems   Constitutional: Negative for chills, diaphoresis, fever and malaise/fatigue.   Eyes: Negative for blurred vision.   Respiratory: Negative for shortness of breath.    Cardiovascular: Negative for chest pain, palpitations, orthopnea, claudication, leg swelling and PND.   Gastrointestinal: Negative for abdominal pain, nausea and vomiting.   Musculoskeletal: Negative for neck pain.   Neurological: Positive for dizziness. Negative for tingling, tremors, sensory change, speech change, focal weakness, seizures, loss of consciousness, weakness and headaches.   Endo/Heme/Allergies: Does not bruise/bleed easily.       Physical Exam:     There were no vitals filed for this visit.    Wt Readings from Last 3 Encounters:   05/19/16 66.5 kg (146 lb 11.2 oz)   10/28/15 71.2 kg (157 lb)   10/01/15 72.9 kg (160 lb 11.2 oz)  General: awake, alert, oriented x 3  HEENT: perrla, eomi, sclera anicteric,oropharynx clear without lesions, mucous membranes moist  Neck: supple, no lymphadenopathy, no thyromegaly, no JVD, no carotid bruits  Cardiovascular: S1, S2, regular rate and rhythm, no murmurs, rubs, or gallops  Lungs: clear to auscultation bilaterally, without wheezing, rhonchi, or rales  Abdomen: soft, non tender, normal bowel sounds  Extremities: no clubbing, cyanosis, or edema  Neuro: cranial nerves grossly intact, strength 5/5 in upper and lower extremities, sensation intact  Skin: no rashes or lesions noted    Diagnostics:     Lab Results   Component Value Date    WBC 6.25 05/19/2016    HGB 10.6 (L) 05/19/2016    HCT 32.1 (L) 05/19/2016    PLT 207 05/19/2016    CHOL 186 05/16/2016    TRIG 153 (H)  05/16/2016    HDL 47 05/16/2016    LDL 108 (H) 05/16/2016    ALT 15 05/17/2016    AST 39 (H) 05/17/2016    NA 137 05/19/2016    K 4.0 05/19/2016    CL 105 05/19/2016    CREAT 2.1 (H) 05/19/2016    BUN 30.9 (H) 05/19/2016    CO2 23 05/19/2016    TSH 1.02 05/16/2016    INR 1.1 05/17/2016    GLU 125 (H) 05/19/2016    HGBA1C 6.3 (H) 10/01/2015       Echocardiogram Adult Complete W Clr/ Dopp Waveform    Result Date: 05/17/2016  1.  The quality of the study is technically adequate for interpretation. 2.  The left ventricle is normal in size. Left ventricular systolic function is normal. There are no regional wall motion abnormalities. Estimated EF is 65%. There is mild concentric left ventricular hypertrophy. There is evidence of abnormal diastolic LV function. 3.  The left atrium is moderately dilated in size. 4.  The aortic valve is trileaflet. Sclerotic aortic valve. Normal valve excursion is present. There is mild to moderate aortic insufficiency. No aortic stenosis is present. The aortic root is normal in size. 5.  The mitral valve is structurally normal. Mild mitral insufficiency is present. 6.  The right ventricle is normal in size and function. 7.  The right atrium is normal in size. 8. The tricuspid valve is structurally normal. There is no significant tricuspid insufficiency. 9. The pulmonic valve is structurally normal. There is no significant pulmonic insufficiency. 10. There is no evidence of pulmonary hypertension. 11. Trace pericardial effusion.. 12. The atrial septum is structurally normal. No shunt by color flow doppler. CONCLUSION: Left ventricle is normal in size with normal systolic function. LVEF 65%. Diastolic dysfunction noted. Mild concentric left ventricular hypertrophy. Left atrium moderately dilated. Sclerotic aortic valve. Mild to moderate aortic regurgitation. Mild mitral regurgitation. Trace pericardial effusion Darnelle Spangle, MD 05/17/2016 4:23 PM     US Renal Kidney    Result Date:  05/16/2016  1. No hydronephrosis. 2. Asymmetric kidney size smaller on the right.. 3. Left renal simple cyst. Ardelia Mems, MD 05/16/2016 5:58 PM     Xr Chest Ap Portable    Result Date: 05/17/2016    Mild pulmonary edema, mildly improved. Maia Breslow, MD 05/17/2016 1:41 PM     Chest Ap Portable    Result Date: 05/16/2016  1. Congestive failure. This is new when compared to 09/30/2015. Dierdre Searles, MD 05/16/2016 5:47 AM     US Renal Artery Duplx Dopp Complete    Result Date: 05/19/2016   1.  No evidence of significant renal artery stenosis. 2. Limited visualization of the mid left renal artery. If the patient has persistent hypertension despite adequate medical renal treatment, further workup with MRA can be considered. 3. Elevated resistive indices bilaterally. This is often indicative of medical renal disease.  Pauline Aus, MD 05/19/2016 5:31 PM       Assessment:     Patient Active Problem List   Diagnosis   . Type 2 diabetes mellitus without complication   . HTN (hypertension) urgency   . Hyperlipidemia   . History of CVA (cerebrovascular accident)   . Chest pain   . SOB (shortness of breath)   . Nausea   . Elevated d-dimer   . Hypertensive emergency       Plan:     1) NSTEMI - no cardiopulmonary sx since after hospital d/c - continue Aspirin 325 mg daily, Coreg 25 mg BID, Losartan 25 mg daily; schedule Cardio appt for LHC ASAP; go to the ED for any recurrent chest pain or other concerning cardiopulmonary  sx    2) HTN - elevated - did not take meds yet; took her AM BP medications in the clinic, BP rechecked after taking med, repeat BP  164/72; continue Amlodipine 10 mg daily, Coreg 25 mg BID, Losartan 25 mg daily, Clonidine 0.1 mg BID and Hydralazine 100 mg BID; check BP before and 1 to 2 hours after taking BP med; call ITS if BP is above 160/90 or below 100/60; call ITS if still feeling dizzy     3) Renal insufficiency - f/u with Nephrology, Dr. Kate Sable as recommended    4) Anemia - likely related to kidney  disease - f/u with Nephro and hematology as recommended    5) T2DM - start Januvia 25 mg daily; check A1C today; check BS BID    6) hx of stroke - no neuro deficits - continue Aspirin and Atorvastatin for secondary stroke prevention    7) HLD - continue Simvastatin 10 mg daily     Follow up: 1 week  Discharged from [facility]?: No  Medical Home Referred to/Referred Back to: IMG     Med Rec Complete: Yes    Teach back for HF, COPD, DM, AMI, PNA: DM, MI

## 2016-05-22 NOTE — Progress Notes (Signed)
CM Samina Hasan delivered a rolling Walker to patient from [employer] [phone].

## 2016-05-23 LAB — HEMOGLOBIN A1C: Hemoglobin A1C: 6.3 % — ABNORMAL HIGH (ref 4.8–5.6)

## 2016-05-25 ENCOUNTER — Ambulatory Visit (INDEPENDENT_AMBULATORY_CARE_PROVIDER_SITE_OTHER): Payer: Self-pay | Admitting: Internal Medicine

## 2016-05-25 ENCOUNTER — Ambulatory Visit (INDEPENDENT_AMBULATORY_CARE_PROVIDER_SITE_OTHER): Payer: Medicare Other | Admitting: Internal Medicine

## 2016-05-25 ENCOUNTER — Encounter (INDEPENDENT_AMBULATORY_CARE_PROVIDER_SITE_OTHER): Payer: Self-pay | Admitting: Internal Medicine

## 2016-05-25 VITALS — BP 169/65 | HR 84 | Temp 98.8°F | Resp 18 | Ht 60.0 in | Wt 144.0 lb

## 2016-05-25 DIAGNOSIS — N189 Chronic kidney disease, unspecified: Secondary | ICD-10-CM

## 2016-05-25 DIAGNOSIS — E119 Type 2 diabetes mellitus without complications: Secondary | ICD-10-CM

## 2016-05-25 DIAGNOSIS — E7849 Other hyperlipidemia: Secondary | ICD-10-CM

## 2016-05-25 DIAGNOSIS — Z23 Encounter for immunization: Secondary | ICD-10-CM

## 2016-05-25 DIAGNOSIS — I10 Essential (primary) hypertension: Secondary | ICD-10-CM

## 2016-05-25 DIAGNOSIS — I129 Hypertensive chronic kidney disease with stage 1 through stage 4 chronic kidney disease, or unspecified chronic kidney disease: Secondary | ICD-10-CM

## 2016-05-25 DIAGNOSIS — E1122 Type 2 diabetes mellitus with diabetic chronic kidney disease: Secondary | ICD-10-CM

## 2016-05-25 DIAGNOSIS — Z1211 Encounter for screening for malignant neoplasm of colon: Secondary | ICD-10-CM

## 2016-05-25 NOTE — Progress Notes (Signed)
Subjective:      Date: 05/25/2016 5:46 PM   Patient ID: Jacqueline Weber is a 65 y.o. female.    Chief Complaint:  Chief Complaint   Patient presents with   . Hypertension       HPI:  Hypertension   This is a chronic problem. Episode onset: 4 years ago. The problem has been waxing and waning since onset. The problem is uncontrolled. Associated symptoms include blurred vision, headaches, malaise/fatigue, neck pain, palpitations and shortness of breath. Pertinent negatives include no anxiety, chest pain, peripheral edema, PND or sweats. There are no associated agents to hypertension. Risk factors for coronary artery disease include diabetes mellitus and dyslipidemia. Past treatments include nothing. There are no compliance problems.  Hypertensive end-organ damage includes CVA.   Hyperlipidemia   This is a chronic problem. The current episode started more than 1 year ago. Associated symptoms include shortness of breath. Pertinent negatives include no chest pain. Current antihyperlipidemic treatment includes statins.   Diabetes   She presents for her initial diabetic visit. She has type 2 diabetes mellitus. Hypoglycemia symptoms include dizziness and headaches. Pertinent negatives for hypoglycemia include no sweats. Associated symptoms include blurred vision. Pertinent negatives for diabetes include no chest pain. Diabetic complications include a CVA. Current diabetic treatment includes oral agent (monotherapy) (just started [place] 2 days ago). Her breakfast blood glucose range is generally 110-130 mg/dl. An ACE inhibitor/angiotensin II receptor blocker is being taken. Eye exam is not current (has appt next week).   Renal insufficiency  Newly diagnosed  Cr 2.1 on 9/15  Has appt with renal next week    Problem List:  Patient Active Problem List   Diagnosis   . Type 2 diabetes mellitus without complication   . HTN (hypertension) urgency   . Hyperlipidemia   . History of CVA (cerebrovascular accident)   . Chest pain   .  SOB (shortness of breath)   . Nausea   . Elevated d-dimer   . Hypertensive emergency       Current Medications:  Current Outpatient Prescriptions   Medication Sig Dispense Refill   . amLODIPine (NORVASC) 10 MG tablet Take 1 tablet (10 mg total) by mouth daily.     [name] aspirin EC 325 MG EC tablet Take 1 tablet (325 mg total) by mouth daily. 30 tablet 0   . carvedilol (COREG) 25 MG tablet Take 25 mg by mouth 2 (two) times daily with meals.        . cloNIDine (CATAPRES) 0.1 MG tablet Take 0.1 mg by mouth 2 (two) times daily.     . famotidine (PEPCID) 20 MG tablet Take 1 tablet (20 mg total) by mouth daily. 30 tablet 0   . fluticasone (FLONASE) 50 MCG/ACT nasal spray 1 spray by Nasal route daily.     . hydrALAZINE (APRESOLINE) 100 MG tablet Take 1 tablet (100 mg total) by mouth 2 (two) times daily. 60 tablet 0   . losartan (COZAAR) 25 MG tablet Take 1 tablet (25 mg total) by mouth daily. 30 tablet 0   . simvastatin (ZOCOR) 10 MG tablet Take 10 mg by mouth nightly.     [name] SITagliptin (JANUVIA) 25 MG tablet Take 1 tablet (25 mg total) by mouth daily. 30 tablet 3   . vitamin D, ergocalciferol, (DRISDOL) 50000 UNIT Cap Take 2,000 Units by mouth 2 (two) times daily.       No current facility-administered medications for this visit.        Allergies:  Allergies   Allergen Reactions   . Mosquito (Culex Pipiens) Allergy Skin Test Shortness Of Breath     And  Trouble  breathing   . Lisinopril    . Shellfish-Derived Products Hives     New  allergy  New  allergy   . Penicillins Rash     Swelling,  numbness       Past Medical History:  Past Medical History:   Diagnosis Date   . Cataracts, bilateral    . Cerebrovascular accident    . CVA (cerebral infarction) 03/2013   . Diabetes mellitus    . Hypercholesteremia    . Hypertension        Past Surgical History:  History reviewed. No pertinent surgical history.    Family History:  Family History   Problem Relation Age of Onset   . No known problems Mother    . No known problems Father         Social History:  Social History     Social History   . Marital status: Widowed     Spouse name: N/A   . Number of children: N/A   . Years of education: N/A     Occupational History   . Not on file.     Social History Main Topics   . Smoking status: Never Smoker   . Smokeless tobacco: Never Used   . Alcohol use No   . Drug use: No   . Sexual activity: Not on file     Other Topics Concern   . Not on file     Social History Narrative   . No narrative on file       The following portions of the patient's history were reviewed and updated as appropriate: allergies, current medications, past family history, past medical history, past social history, past surgical history and problem list.    Vitals:  BP 169/65 (BP Site: Left arm, Patient Position: Sitting, Cuff Size: Medium)   Pulse 84   Temp 98.8 F (37.1 C) (Oral)   Resp 18   Ht 1.524 m (5')   Wt 65.3 kg (144 lb)   SpO2 95%   BMI 28.12 kg/m      Review of Systems   Constitutional: Positive for malaise/fatigue.   Eyes: Positive for blurred vision.   Respiratory: Positive for shortness of breath.    Cardiovascular: Positive for palpitations. Negative for chest pain and PND.   Gastrointestinal: Negative for diarrhea, nausea and vomiting.   Musculoskeletal: Positive for neck pain.   Neurological: Positive for dizziness and headaches.     General Examination:   GENERAL APPEARANCE: alert, in no acute distress, well developed, well nourished, oriented to time, place, and person.   HEAD: normal appearance, atraumatic.   EYES: extraocular movement intact (EOMI), pupils equal, round, reactive to light and accommodation, sclera anicteric, conjunctiva clear.   EARS: tympanic membranes normal bilaterally, external canals normal .   NOSE: normal nasal mucosa, no lesions.   ORAL CAVITY: normal oropharynx, normal lips, mucosa moist, no lesions.   THROAT: normal appearance, clear, no erythema.   NECK/THYROID: neck supple,  no cervical lymphadenopathy, no neck mass  palpated, no thyromegaly.   LYMPH NODES: no palpable adenopathy.   SKIN: good turgor, no rashes, no suspicious lesions.   HEART: S1, S2 normal, no murmurs, rubs, gallops, regular rate and rhythm.   LUNGS: normal effort / no distress, normal breath sounds, clear to auscultation bilaterally, no wheezes, rales, rhonchi.  ABDOMEN: bowel sounds present, no hepatosplenomegaly, soft, nontender, nondistended.   MUSCULOSKELETAL: full range of motion, no swelling or deformity.   EXTREMITIES: no edema, no clubbing, cyanosis, or edema.   PERIPHERAL PULSES: 2+ dorsalis pedis, 2+ posterior tibial.   NEUROLOGIC: nonfocal, cranial nerves 2-12 grossly intact, Foot Exam: monofilament testing of plantar aspect of first toe and metatarsal joints (10g monofilament) intact bilaterally strength 5/5  PSYCH: cognitive function intact, mood/affect full range, speech clear.    1. Essential hypertension  Bp not at goal, pt will f/u with cardiologist next week    2. Other hyperlipidemia  F/u with cardiology next week    3. Type 2 diabetes mellitus without complication, without long-term current use of insulin  - Microalbumin, Random Urine  Pt just started [place] 2 days ago, will not recheck HgbA1c for 3 months. Pt to continue to monitor BS    4. Chronic kidney disease, unspecified stage  Keep appt with renal next week    5. Need for vaccination  - Flu vaccine QUAD PRES FREE 86YRS & GREATER  - Varicella-zoster vaccine subcutaneous    6. Screening for colon cancer  - Cologuard; Future    F/u 2 months  Spent 30 minutes face to face time with patient, more than 50 % time spent in care planning with patient and daughter.  Also reviewed hospital records.    John Giovanni, MD

## 2016-05-26 ENCOUNTER — Encounter (INDEPENDENT_AMBULATORY_CARE_PROVIDER_SITE_OTHER): Payer: Self-pay | Admitting: Internal Medicine

## 2016-05-26 LAB — MICROALBUMIN, RANDOM URINE
Urine Creatinine, Random: 80.1 mg/dL
Urine Microalbumin, Random: >2000 — ABNORMAL HIGH (ref 0.0–30.0)

## 2016-05-29 ENCOUNTER — Ambulatory Visit (INDEPENDENT_AMBULATORY_CARE_PROVIDER_SITE_OTHER): Payer: Self-pay | Admitting: Cardiovascular Disease

## 2016-05-29 LAB — VAHRT HISTORIC LVEF: Ejection Fraction: 65 %

## 2016-05-30 DIAGNOSIS — H269 Unspecified cataract: Secondary | ICD-10-CM | POA: Diagnosis present

## 2016-05-30 DIAGNOSIS — E872 Acidosis: Secondary | ICD-10-CM | POA: Diagnosis present

## 2016-05-30 DIAGNOSIS — K5900 Constipation, unspecified: Secondary | ICD-10-CM | POA: Diagnosis present

## 2016-05-30 DIAGNOSIS — Z8673 Personal history of transient ischemic attack (TIA), and cerebral infarction without residual deficits: Secondary | ICD-10-CM

## 2016-05-30 DIAGNOSIS — E7800 Pure hypercholesterolemia, unspecified: Secondary | ICD-10-CM | POA: Diagnosis present

## 2016-05-30 DIAGNOSIS — I130 Hypertensive heart and chronic kidney disease with heart failure and stage 1 through stage 4 chronic kidney disease, or unspecified chronic kidney disease: Secondary | ICD-10-CM | POA: Diagnosis present

## 2016-05-30 DIAGNOSIS — N184 Chronic kidney disease, stage 4 (severe): Secondary | ICD-10-CM | POA: Diagnosis present

## 2016-05-30 DIAGNOSIS — N281 Cyst of kidney, acquired: Secondary | ICD-10-CM | POA: Diagnosis present

## 2016-05-30 DIAGNOSIS — I21A1 Myocardial infarction type 2: Secondary | ICD-10-CM | POA: Diagnosis present

## 2016-05-30 DIAGNOSIS — N179 Acute kidney failure, unspecified: Secondary | ICD-10-CM | POA: Diagnosis present

## 2016-05-30 DIAGNOSIS — N270 Small kidney, unilateral: Secondary | ICD-10-CM | POA: Diagnosis present

## 2016-05-30 DIAGNOSIS — R0789 Other chest pain: Secondary | ICD-10-CM | POA: Insufficient documentation

## 2016-05-30 DIAGNOSIS — N280 Ischemia and infarction of kidney: Secondary | ICD-10-CM | POA: Diagnosis present

## 2016-05-30 DIAGNOSIS — D509 Iron deficiency anemia, unspecified: Secondary | ICD-10-CM | POA: Diagnosis present

## 2016-05-30 DIAGNOSIS — E1122 Type 2 diabetes mellitus with diabetic chronic kidney disease: Secondary | ICD-10-CM | POA: Diagnosis present

## 2016-05-30 DIAGNOSIS — Z794 Long term (current) use of insulin: Secondary | ICD-10-CM

## 2016-05-30 DIAGNOSIS — I160 Hypertensive urgency: Secondary | ICD-10-CM

## 2016-05-30 DIAGNOSIS — D631 Anemia in chronic kidney disease: Secondary | ICD-10-CM | POA: Diagnosis present

## 2016-05-30 DIAGNOSIS — I083 Combined rheumatic disorders of mitral, aortic and tricuspid valves: Secondary | ICD-10-CM | POA: Diagnosis present

## 2016-05-30 DIAGNOSIS — H409 Unspecified glaucoma: Secondary | ICD-10-CM | POA: Diagnosis present

## 2016-05-30 DIAGNOSIS — E785 Hyperlipidemia, unspecified: Secondary | ICD-10-CM | POA: Diagnosis present

## 2016-05-30 DIAGNOSIS — I161 Hypertensive emergency: Principal | ICD-10-CM | POA: Diagnosis present

## 2016-05-30 DIAGNOSIS — Z9119 Patient's noncompliance with other medical treatment and regimen: Secondary | ICD-10-CM

## 2016-05-30 DIAGNOSIS — I2510 Atherosclerotic heart disease of native coronary artery without angina pectoris: Secondary | ICD-10-CM | POA: Diagnosis present

## 2016-05-30 DIAGNOSIS — I5032 Chronic diastolic (congestive) heart failure: Secondary | ICD-10-CM | POA: Diagnosis present

## 2016-05-30 DIAGNOSIS — E871 Hypo-osmolality and hyponatremia: Secondary | ICD-10-CM | POA: Diagnosis present

## 2016-05-31 ENCOUNTER — Ambulatory Visit (INDEPENDENT_AMBULATORY_CARE_PROVIDER_SITE_OTHER): Payer: Self-pay

## 2016-05-31 ENCOUNTER — Inpatient Hospital Stay
Admission: EM | Admit: 2016-05-31 | Discharge: 2016-06-08 | DRG: 281 | Disposition: A | Discharge status: Other Acute Inpatient Hospital | Payer: Medicare Other | Attending: Internal Medicine | Admitting: Internal Medicine

## 2016-05-31 ENCOUNTER — Encounter: Payer: Self-pay | Admitting: Registered Nurse

## 2016-05-31 ENCOUNTER — Observation Stay: Payer: Medicare Other | Admitting: Certified Registered"

## 2016-05-31 ENCOUNTER — Observation Stay: Payer: Medicare Other

## 2016-05-31 ENCOUNTER — Encounter (INDEPENDENT_AMBULATORY_CARE_PROVIDER_SITE_OTHER): Payer: Self-pay

## 2016-05-31 ENCOUNTER — Emergency Department: Payer: Medicare Other

## 2016-05-31 ENCOUNTER — Inpatient Hospital Stay: Payer: Medicare Other | Admitting: Internal Medicine

## 2016-05-31 ENCOUNTER — Ambulatory Visit (INDEPENDENT_AMBULATORY_CARE_PROVIDER_SITE_OTHER): Payer: Medicare Other | Admitting: "Endocrinology

## 2016-05-31 DIAGNOSIS — R0602 Shortness of breath: Secondary | ICD-10-CM

## 2016-05-31 DIAGNOSIS — R079 Chest pain, unspecified: Secondary | ICD-10-CM | POA: Diagnosis present

## 2016-05-31 DIAGNOSIS — R791 Abnormal coagulation profile: Secondary | ICD-10-CM

## 2016-05-31 DIAGNOSIS — R7989 Other specified abnormal findings of blood chemistry: Secondary | ICD-10-CM

## 2016-05-31 DIAGNOSIS — I161 Hypertensive emergency: Principal | ICD-10-CM

## 2016-05-31 DIAGNOSIS — N289 Disorder of kidney and ureter, unspecified: Secondary | ICD-10-CM

## 2016-05-31 DIAGNOSIS — I160 Hypertensive urgency: Secondary | ICD-10-CM

## 2016-05-31 DIAGNOSIS — R739 Hyperglycemia, unspecified: Secondary | ICD-10-CM

## 2016-05-31 DIAGNOSIS — I158 Other secondary hypertension: Secondary | ICD-10-CM

## 2016-05-31 DIAGNOSIS — E871 Hypo-osmolality and hyponatremia: Secondary | ICD-10-CM

## 2016-05-31 DIAGNOSIS — R0789 Other chest pain: Secondary | ICD-10-CM | POA: Insufficient documentation

## 2016-05-31 LAB — COMPREHENSIVE METABOLIC PANEL
ALT: 9 U/L (ref 0–55)
AST (SGOT): 19 U/L (ref 5–34)
Albumin/Globulin Ratio: 0.8 — ABNORMAL LOW (ref 0.9–2.2)
Albumin: 2.7 g/dL — ABNORMAL LOW (ref 3.5–5.0)
Alkaline Phosphatase: 71 U/L (ref 37–106)
Anion Gap: 12 (ref 5.0–15.0)
BUN: 38.6 mg/dL — ABNORMAL HIGH (ref 7.0–19.0)
Bilirubin, Total: 0.3 mg/dL (ref 0.2–1.2)
CO2: 16 mEq/L — ABNORMAL LOW (ref 22–29)
Calcium: 9.3 mg/dL (ref 8.5–10.5)
Chloride: 101 mEq/L (ref 100–111)
Creatinine: 2.6 mg/dL — ABNORMAL HIGH (ref 0.6–1.0)
Globulin: 3.5 g/dL (ref 2.0–3.6)
Glucose: 152 mg/dL — ABNORMAL HIGH (ref 70–100)
Potassium: 4.8 mEq/L (ref 3.5–5.1)
Protein, Total: 6.2 g/dL (ref 6.0–8.3)
Sodium: 129 mEq/L — ABNORMAL LOW (ref 136–145)

## 2016-05-31 LAB — CBC AND DIFFERENTIAL
Absolute NRBC: 0 10*3/uL
Basophils Absolute Automated: 0.03 10*3/uL (ref 0.00–0.20)
Basophils Automated: 0.5 %
Eosinophils Absolute Automated: 0.21 10*3/uL (ref 0.00–0.70)
Eosinophils Automated: 3.3 %
Hematocrit: 35.6 % — ABNORMAL LOW (ref 37.0–47.0)
Hgb: 11.8 g/dL — ABNORMAL LOW (ref 12.0–16.0)
Immature Granulocytes Absolute: 0.02 10*3/uL
Immature Granulocytes: 0.3 %
Lymphocytes Absolute Automated: 1.51 10*3/uL (ref 0.50–4.40)
Lymphocytes Automated: 24.1 %
MCH: 22.3 pg — ABNORMAL LOW (ref 28.0–32.0)
MCHC: 33.1 g/dL (ref 32.0–36.0)
MCV: 67.4 fL — ABNORMAL LOW (ref 80.0–100.0)
MPV: 10 fL (ref 9.4–12.3)
Monocytes Absolute Automated: 0.57 10*3/uL (ref 0.00–1.20)
Monocytes: 9.1 %
Neutrophils Absolute: 3.93 10*3/uL (ref 1.80–8.10)
Neutrophils: 62.7 %
Nucleated RBC: 0 /100 WBC (ref 0.0–1.0)
Platelets: 257 10*3/uL (ref 140–400)
RBC: 5.28 10*6/uL (ref 4.20–5.40)
RDW: 17 % — ABNORMAL HIGH (ref 12–15)
WBC: 6.27 10*3/uL (ref 3.50–10.80)

## 2016-05-31 LAB — ECG 12-LEAD
Atrial Rate: 87 {beats}/min
P-R Interval: 196 ms
Q-T Interval: 360 ms
QRS Duration: 78 ms
QTC Calculation (Bezet): 433 ms
R Axis: 8 degrees
T Axis: 255 degrees
Ventricular Rate: 87 {beats}/min

## 2016-05-31 LAB — TROPONIN I
Troponin I: 0.01 ng/mL (ref 0.00–0.09)
Troponin I: 0.03 ng/mL (ref 0.00–0.09)
Troponin I: 0.03 ng/mL (ref 0.00–0.09)

## 2016-05-31 LAB — OSMOLALITY, URINE: Urine Osmolality: 208 mosm/kg — ABNORMAL LOW (ref 300–1094)

## 2016-05-31 LAB — B-TYPE NATRIURETIC PEPTIDE: B-Natriuretic Peptide: 504.8 pg/mL — ABNORMAL HIGH (ref 0.0–100.0)

## 2016-05-31 LAB — MAGNESIUM: Magnesium: 2.2 mg/dL (ref 1.6–2.6)

## 2016-05-31 LAB — IHS D-DIMER: D-Dimer: 6.24 ug/mL FEU — ABNORMAL HIGH (ref 0.00–0.51)

## 2016-05-31 LAB — SODIUM, URINE, RANDOM: Urine Sodium Random: 64 mEq/L

## 2016-05-31 LAB — GFR: EGFR: 18.5

## 2016-05-31 LAB — OSMOLALITY: Osmolality: 284 mosm/kg (ref 280–300)

## 2016-05-31 LAB — GLUCOSE WHOLE BLOOD - POCT
Whole Blood Glucose POCT: 127 mg/dL — ABNORMAL HIGH (ref 70–100)
Whole Blood Glucose POCT: 149 mg/dL — ABNORMAL HIGH (ref 70–100)

## 2016-05-31 MED ORDER — LABETALOL HCL 5 MG/ML IV SOLN
10.0000 mg | Freq: Four times a day (QID) | INTRAVENOUS | Status: DC | PRN
Start: 2016-05-31 — End: 2016-06-01
  Administered 2016-06-01: 10 mg via INTRAVENOUS
  Filled 2016-05-31: qty 4

## 2016-05-31 MED ORDER — AMLODIPINE BESYLATE 5 MG PO TABS
10.0000 mg | ORAL_TABLET | Freq: Every day | ORAL | Status: DC
Start: 2016-06-01 — End: 2016-06-01
  Filled 2016-05-31: qty 2

## 2016-05-31 MED ORDER — SODIUM CHLORIDE 0.9 % IV SOLN
INTRAVENOUS | Status: DC
Start: 2016-05-31 — End: 2016-06-01

## 2016-05-31 MED ORDER — LIDOCAINE HCL (PF) 2 % IJ SOLN
INTRAMUSCULAR | Status: AC
Start: 2016-05-31 — End: ?
  Filled 2016-05-31: qty 10

## 2016-05-31 MED ORDER — HYDRALAZINE HCL 25 MG PO TABS
100.0000 mg | ORAL_TABLET | Freq: Two times a day (BID) | ORAL | Status: DC
Start: 2016-05-31 — End: 2016-06-01
  Administered 2016-05-31 – 2016-06-01 (×4): 100 mg via ORAL
  Filled 2016-05-31 (×2): qty 2
  Filled 2016-05-31 (×2): qty 4
  Filled 2016-05-31 (×2): qty 2
  Filled 2016-05-31: qty 4

## 2016-05-31 MED ORDER — LOSARTAN POTASSIUM 25 MG PO TABS
25.0000 mg | ORAL_TABLET | Freq: Every day | ORAL | Status: DC
Start: 2016-06-01 — End: 2016-05-31

## 2016-05-31 MED ORDER — MORPHINE SULFATE 2 MG/ML IJ/IV SOLN (WRAP)
2.0000 mg | Status: DC | PRN
Start: 2016-05-31 — End: 2016-06-08
  Administered 2016-05-31 – 2016-06-07 (×3): 2 mg via INTRAVENOUS
  Filled 2016-05-31 (×3): qty 1

## 2016-05-31 MED ORDER — SITAGLIPTIN PHOSPHATE 50 MG PO TABS
25.0000 mg | ORAL_TABLET | Freq: Every day | ORAL | Status: DC
Start: 2016-05-31 — End: 2016-06-01
  Administered 2016-06-01: 25 mg via ORAL
  Filled 2016-05-31 (×2): qty 1

## 2016-05-31 MED ORDER — LOSARTAN POTASSIUM 25 MG PO TABS
25.0000 mg | ORAL_TABLET | Freq: Every day | ORAL | Status: DC
Start: 2016-05-31 — End: 2016-05-31

## 2016-05-31 MED ORDER — ASPIRIN 81 MG PO CHEW
324.0000 mg | CHEWABLE_TABLET | Freq: Once | ORAL | Status: AC
Start: 2016-05-31 — End: 2016-05-31
  Administered 2016-05-31: 324 mg via ORAL
  Filled 2016-05-31: qty 4

## 2016-05-31 MED ORDER — CLONIDINE HCL 0.1 MG PO TABS
0.1000 mg | ORAL_TABLET | Freq: Two times a day (BID) | ORAL | Status: DC
Start: 2016-05-31 — End: 2016-06-02
  Administered 2016-05-31 – 2016-06-01 (×4): 0.1 mg via ORAL
  Filled 2016-05-31 (×4): qty 1

## 2016-05-31 MED ORDER — INSULIN LISPRO 100 UNIT/ML SC SOLN
1.0000 [IU] | Freq: Three times a day (TID) | SUBCUTANEOUS | Status: DC | PRN
Start: 2016-05-31 — End: 2016-06-01

## 2016-05-31 MED ORDER — HYDRALAZINE HCL 50 MG PO TABS
100.0000 mg | ORAL_TABLET | Freq: Two times a day (BID) | ORAL | Status: DC
Start: 2016-05-31 — End: 2016-05-31

## 2016-05-31 MED ORDER — TECHNETIUM TC 99M PENTETATE AEROSOL
63.6000 | Freq: Once | Status: AC | PRN
Start: 2016-05-31 — End: 2016-05-31
  Administered 2016-05-31: 63.6 via RESPIRATORY_TRACT
  Filled 2016-05-31: qty 75

## 2016-05-31 MED ORDER — GLUCAGON 1 MG IJ SOLR (WRAP)
1.0000 mg | INTRAMUSCULAR | Status: DC | PRN
Start: 2016-05-31 — End: 2016-06-08

## 2016-05-31 MED ORDER — DEXTROSE 10 % IV BOLUS
125.0000 mL | INTRAVENOUS | Status: DC | PRN
Start: 2016-05-31 — End: 2016-06-08

## 2016-05-31 MED ORDER — FUROSEMIDE 10 MG/ML IJ SOLN
INTRAMUSCULAR | Status: AC
Start: 2016-05-31 — End: 2016-05-31
  Administered 2016-05-31: 20 mg via INTRAVENOUS
  Filled 2016-05-31: qty 4

## 2016-05-31 MED ORDER — CARVEDILOL 12.5 MG PO TABS
25.0000 mg | ORAL_TABLET | Freq: Two times a day (BID) | ORAL | Status: DC
Start: 2016-05-31 — End: 2016-06-06
  Administered 2016-05-31 – 2016-06-06 (×13): 25 mg via ORAL
  Filled 2016-05-31 (×16): qty 2

## 2016-05-31 MED ORDER — FAMOTIDINE 20 MG PO TABS
20.0000 mg | ORAL_TABLET | Freq: Every day | ORAL | Status: DC
Start: 2016-05-31 — End: 2016-05-31

## 2016-05-31 MED ORDER — PROPOFOL 10 MG/ML IV EMUL (WRAP)
INTRAVENOUS | Status: AC
Start: 2016-05-31 — End: ?
  Filled 2016-05-31: qty 40

## 2016-05-31 MED ORDER — CLONIDINE HCL 0.1 MG PO TABS
0.1000 mg | ORAL_TABLET | Freq: Two times a day (BID) | ORAL | Status: DC
Start: 2016-05-31 — End: 2016-05-31

## 2016-05-31 MED ORDER — ASPIRIN 325 MG PO TBEC
325.0000 mg | DELAYED_RELEASE_TABLET | Freq: Every day | ORAL | Status: DC
Start: 2016-05-31 — End: 2016-05-31

## 2016-05-31 MED ORDER — ASPIRIN 325 MG PO TBEC
325.0000 mg | DELAYED_RELEASE_TABLET | Freq: Every day | ORAL | Status: DC
Start: 2016-06-01 — End: 2016-06-08
  Administered 2016-06-01 – 2016-06-08 (×8): 325 mg via ORAL
  Filled 2016-05-31 (×8): qty 1

## 2016-05-31 MED ORDER — LOSARTAN POTASSIUM 100 MG PO TABS
100.0000 mg | ORAL_TABLET | Freq: Every day | ORAL | Status: DC
Start: 2016-06-01 — End: 2016-06-08
  Administered 2016-06-01 – 2016-06-08 (×8): 100 mg via ORAL
  Filled 2016-05-31: qty 1
  Filled 2016-05-31: qty 4
  Filled 2016-05-31 (×2): qty 1
  Filled 2016-05-31: qty 4
  Filled 2016-05-31: qty 1
  Filled 2016-05-31: qty 3
  Filled 2016-05-31: qty 1
  Filled 2016-05-31: qty 4

## 2016-05-31 MED ORDER — ACETAMINOPHEN 325 MG PO TABS
650.0000 mg | ORAL_TABLET | ORAL | Status: DC | PRN
Start: 2016-05-31 — End: 2016-05-31

## 2016-05-31 MED ORDER — HYDRALAZINE HCL 20 MG/ML IJ SOLN
10.0000 mg | Freq: Once | INTRAMUSCULAR | Status: AC
Start: 2016-05-31 — End: 2016-05-31

## 2016-05-31 MED ORDER — GLUCOSE 40 % PO GEL
15.0000 g | ORAL | Status: DC | PRN
Start: 2016-05-31 — End: 2016-06-08

## 2016-05-31 MED ORDER — LABETALOL HCL 5 MG/ML IV SOLN
10.0000 mg | Freq: Once | INTRAVENOUS | Status: AC
Start: 2016-05-31 — End: 2016-05-31
  Administered 2016-05-31: 10 mg via INTRAVENOUS
  Filled 2016-05-31: qty 4

## 2016-05-31 MED ORDER — TECHNETIUM TC 99M ALBUMIN AGGREGATED
6.6000 | Freq: Once | Status: AC | PRN
Start: 2016-05-31 — End: 2016-05-31
  Administered 2016-05-31: 7 via INTRAVENOUS
  Filled 2016-05-31: qty 10

## 2016-05-31 MED ORDER — AMLODIPINE BESYLATE 5 MG PO TABS
10.0000 mg | ORAL_TABLET | Freq: Every day | ORAL | Status: DC
Start: 2016-05-31 — End: 2016-05-31

## 2016-05-31 MED ORDER — SIMVASTATIN 10 MG PO TABS
10.0000 mg | ORAL_TABLET | Freq: Every evening | ORAL | Status: DC
Start: 2016-05-31 — End: 2016-06-08
  Administered 2016-05-31 – 2016-06-07 (×8): 10 mg via ORAL
  Filled 2016-05-31 (×10): qty 1

## 2016-05-31 MED ORDER — INSULIN LISPRO 100 UNIT/ML SC SOLN
1.0000 [IU] | Freq: Every evening | SUBCUTANEOUS | Status: DC | PRN
Start: 2016-05-31 — End: 2016-06-01

## 2016-05-31 MED ORDER — FUROSEMIDE 10 MG/ML IJ SOLN
20.0000 mg | Freq: Once | INTRAMUSCULAR | Status: AC
Start: 2016-05-31 — End: 2016-05-31

## 2016-05-31 MED ORDER — PROPOFOL INFUSION 10 MG/ML
INTRAVENOUS | Status: DC | PRN
Start: 2016-05-31 — End: 2016-05-31
  Administered 2016-05-31 (×2): 30 mg via INTRAVENOUS
  Administered 2016-05-31: 50 mg via INTRAVENOUS
  Administered 2016-05-31: 20 mg via INTRAVENOUS

## 2016-05-31 MED ORDER — BENZOCAINE 20% MT SOLN (WRAP)
OROMUCOSAL | Status: AC
Start: 2016-05-31 — End: 2016-06-01
  Filled 2016-05-31: qty 0.28

## 2016-05-31 MED ORDER — HYDRALAZINE HCL 20 MG/ML IJ SOLN
INTRAMUSCULAR | Status: AC
Start: 2016-05-31 — End: 2016-05-31
  Administered 2016-05-31: 10 mg via INTRAVENOUS
  Filled 2016-05-31: qty 1

## 2016-05-31 MED ORDER — LIDOCAINE HCL 2 % IJ SOLN
INTRAMUSCULAR | Status: DC | PRN
Start: 2016-05-31 — End: 2016-05-31
  Administered 2016-05-31: 100 mg

## 2016-05-31 NOTE — Progress Notes (Signed)
05/31/16 2032   Patient Type   Within 30 Days of Previous Admission? Yes   Healthcare Decisions   Interviewed: Patient;Family   Orientation/Decision Making Abilities of Patient Alert and Oriented x3, able to make decisions   Advance Directive Patient does not have advance directive   Healthcare Agent Appointed No   Prior to admission   Prior level of function Independent with ADLs;Ambulates with assistive device   Type of Residence Private residence   Home Layout Multi-level  (bedroom and bathroom on the upper level. there are 14 steps to the upper level.)   Have running water, electricity, heat, etc? Yes   Living Arrangements Children   How do you get to your MD appointments? son or dtr drive   How do you get your groceries? son or dtr drive   Who fixes your meals? dtr   Who does your laundry? dtr   Who picks up your prescriptions? son or dtr drive   Dressing Independent   Grooming Independent   Feeding Independent   Bathing Independent   Toileting Independent   DME Currently at Home Other (Comment);Front wheel walker  (nebulizer)   [place] PT/OT/Speech   Name of Agency [facility] in the past   Prior SNF admission? (Detail) no   Prior Rehab admission? (Detail) no   Adult Protective Services (APS) involved? No   Discharge [place]   Patient expects to be discharged to: TBD based on progress during the hospital stay and PT/OT recommendation. PCP f/u appt arranged.   Anticipated [place] plan discussed with: Same as interviewed   Follow up appointment scheduled? Yes   Follow up appointment scheduled with: PCP  (10/6 at 10:30am)   Potential barriers to discharge: Other  (none)   Mode of transportation: Other  (to be determined based on mobility level at discharge.)   Consults/Providers   PT Evaluation Needed 1   OT Evalulation Needed 1   SLP Evaluation Needed 2   Correct PCP listed in Epic? Yes

## 2016-05-31 NOTE — Anesthesia Preprocedure Evaluation (Signed)
Anesthesia Evaluation    AIRWAY    Mallampati: III    TM distance: >3 FB  Neck ROM: full  Mouth Opening:full   CARDIOVASCULAR    cardiovascular exam normal       DENTAL    no notable dental hx     PULMONARY    pulmonary exam normal and clear to auscultation     OTHER FINDINGS               (05/31/2016) Cardiology, Dr. Ortencia Kick:   Recurrent hypertensive crisis with chest pain, possible CHF   Widened mediastinum in CXR   Worsened renal insufficiency, Scr up to 2.6   Type II MI in setting of hypertensive crisis last week, TnI went up to 8.03 - cath deferred due to renal insufficiency   Abnormal OP nuclear stress test findings, February 2017, nuclear stress test, small-sized, mild intensity inferoapical ischemia, EF 72%.   February 2017, echo, EF 70%, concentric LVH, grade I diastolic dysfunction,    mild AI.            Anesthesia Plan    ASA 3 - emergent     general                             Post op pain management: per surgeon    informed consent obtained    ECG reviewed ((05/31/2016) NSR, Qs V1-3, no acute ST-T)               Signed by: Sabino Dick 05/31/16 1:40 PM

## 2016-05-31 NOTE — ED Triage Notes (Signed)
Patient came in for chest pain and shortness of breath. Patient rates pain 8/10 all over.  Patient denies weapons.

## 2016-05-31 NOTE — Progress Notes (Signed)
05/31/16 2028   [facility] Readmission Patient Interview/Contributing Factors   Kept informed about diagnosis during hospital stay? Some of the time   At discharge, discuss diagnoses? Yes   At discharge, discuss procedures? Yes   At discharge, discuss signs/sympoms? Yes   At discharge, discuss what to do for worsening of disease? Yes   At discharge, discuss who to contact? Yes   Asked if you understood instructions? Yes   D/C Instructions written, given to you? Yes   D/C Instructions easy to read? Yes   How confident about understanding instructions? Somewhat confident   Regular doctor for most things? Yes   At discharge, discuss medications, diet, activity? Yes   Take meds as prescribed, follow diet/activity restrictions?  Yes   Post-discharge appointment with MD? Yes   Who made the appointment? Hospital staff   After discharge, how long before your appointment? A few days   Contributing Factors to Readmission Expected disease progression   Patient active with Home Health? Yes   Name of [facility] Agency from last discharge: [facility]   Patient active with home hospice? No   Was patient readmitted from a facility? Not readmitted from a facility

## 2016-05-31 NOTE — Progress Notes (Signed)
05/31/16 1608   Discharge Planning   Follow up appointment scheduled? Yes   Follow up appointment scheduled with: PCP  (06/09/16 at 10:30am)

## 2016-05-31 NOTE — Anesthesia Postprocedure Evaluation (Signed)
Anesthesia Post Evaluation    Patient: Jacqueline Weber    * No procedures listed *    Anesthesia type: general    Last Vitals:   Vitals:    05/31/16 1415   BP: 148/62   Pulse: 74   Resp: 18   Temp:    SpO2: (!) 78%       Patient Location: ICU      Post Pain: Patient not complaining of pain, continue current therapy    Mental Status: other - See comments (still sleepy from sedation )    Respiratory Function: tolerating nasal cannula    Cardiovascular: stable    Nausea/Vomiting: patient not complaining of nausea or vomiting    Hydration Status: adequate    Post Assessment: no apparent anesthetic complications          Anesthesia Qualified Clinical Data Registry    Central Line      CVC insertion : NO                                               Perioperative temperature management      General/neuraxial anesthesia > or = 60 minutes (excluding CABG) : NO                                Administration of antibiotic prophylaxis      Age > or = 18, with IV access, with surgical procedure for which antibiotic prophylaxis indicated, and not on chronic antibiotics : NO                  Medication Administration      Ordering or administration of drug inconsistent with intended drug, dose, delivery or timing : NO      Dental loss/damage      Dental injury with administration of anesthesia : NO      Difficult intubation due to unrecognized difficult airway        Elective airway procedure including but not limited to: tracheostomy, fiberoptic bronchoscopy, rigid bronchoscopy; jet ventilation; or elective use of a device to facilitate airway management such as a Glidescope : NO                > Unanticipated difficult intubation post pre-evaluation : NO      Aspiration of gastric contents        Aspiration of gastric contents : NO                    Surgical fire        Procedure requiring electrocautery/laser : NO                    Immediate perioperative cardiac arrest        Cardiac arrest in OR or PACU : NO                     Unplanned hospital admission        Unplanned hospital admission for initially intended outpatient anesthesia service : NO      Unplanned ICU admission        Unplanned ICU admission related to anesthesia occurring within 24 hours of induction or start of MAC : NO      Surgical case cancellation  Cancellation of procedure after care already started by anesthesia care team : NO      Post-anesthesia transfer of care checklist/protocol to PACU        Transfer from OR to PACU upon case conclusion : YES              > Use of PACU transfer checklist/protocol : YES     (Includes the key elements of: patient identification, responsible practitioner identification (PACU nurse or advanced practitioner), discussion of pertinent history and procedure course, intraoperative anesthetic management, post-procedure plans, acknowledgement/questions)    Post-anesthesia transfer of care checklist/protocol to ICU        Transfer from OR to ICU upon case conclusion : NO                    Post-operative nausea/vomiting risk protocol        Post-operative nausea/vomiting risk protocol : NO  Patient > or = 18 with care initiated by anesthesia team that has a risk factor screen for post-op nausea/vomiting (Includes female, hx PONV, or motion sickness, non-smoker, intended opioid administration for post-op analgesia.)    Anaphylaxis        Anaphylaxis during anesthesia services : NO    (Inclusive of any suspected transfusion reaction in association with blood-bank confirmed blood product incompatibility)              [place], 05/31/2016 2:24 PM

## 2016-05-31 NOTE — ED Provider Notes (Signed)
Physician/Midlevel provider first contact with patient: 05/31/16 0045         History     Chief Complaint   Patient presents with   . Chest Pain     Pt with diffuse cp and sob since 1900 with similar weeks ago with mi- no uri sxs- no other pain- no dysuria- no n/v/d/c- no fever or rash- no syncope or trauma      The history is provided by the patient and a relative. The history is limited by a language barrier. A language interpreter was used.            Past Medical History:   Diagnosis Date   . Cataracts, bilateral    . Cerebrovascular accident    . CVA (cerebral infarction) 03/2013   . Diabetes mellitus    . Hypercholesteremia    . Hypertension        History reviewed. No pertinent surgical history.    Family History   Problem Relation Age of Onset   . No known problems Mother    . No known problems Father        Social  Social History   Substance Use Topics   . Smoking status: Never Smoker   . Smokeless tobacco: Never Used   . Alcohol use No       .     Allergies   Allergen Reactions   . Mosquito (Culex Pipiens) Allergy Skin Test Shortness Of Breath     And  Trouble  breathing   . Lisinopril    . Shellfish-Derived Products Hives     New  allergy  New  allergy   . Penicillins Rash     Swelling,  numbness       Home Medications     Med List Status:  In Progress Set By: Isac Sarna, RN at 05/30/2016 11:59 PM                amLODIPine (NORVASC) 10 MG tablet     Take 1 tablet (10 mg total) by mouth daily.     aspirin EC 325 MG EC tablet     Take 1 tablet (325 mg total) by mouth daily.     carvedilol (COREG) 25 MG tablet     Take 25 mg by mouth 2 (two) times daily with meals.        cloNIDine (CATAPRES) 0.1 MG tablet     Take 0.1 mg by mouth 2 (two) times daily.     famotidine (PEPCID) 20 MG tablet     Take 1 tablet (20 mg total) by mouth daily.     fluticasone (FLONASE) 50 MCG/ACT nasal spray     1 spray by Nasal route daily.     hydrALAZINE (APRESOLINE) 100 MG tablet     Take 1 tablet (100 mg total) by mouth  2 (two) times daily.     losartan (COZAAR) 25 MG tablet     Take 1 tablet (25 mg total) by mouth daily.     simvastatin (ZOCOR) 10 MG tablet     Take 10 mg by mouth nightly.     SITagliptin (JANUVIA) 25 MG tablet     Take 1 tablet (25 mg total) by mouth daily.     vitamin D, ergocalciferol, (DRISDOL) 50000 UNIT Cap     Take 2,000 Units by mouth 2 (two) times daily.           Review of Systems   Constitutional:  Negative for activity change and fever.   HENT: Negative for congestion, rhinorrhea and sore throat.    Respiratory: Positive for shortness of breath. Negative for cough.    Cardiovascular: Positive for chest pain.   Gastrointestinal: Negative for abdominal pain, constipation, diarrhea, nausea and vomiting.   Genitourinary: Negative for difficulty urinating and dysuria.   Musculoskeletal: Negative for back pain and neck pain.        No trauma   Skin: Negative for color change and rash.   Neurological: Negative for syncope and headaches.   All other systems reviewed and are negative.      Physical Exam    BP: 176/84, Heart Rate: 77, Temp: 98.2 F (36.8 C), Resp Rate: 16, SpO2: 99 %, Weight: 64.9 kg    Physical Exam   Constitutional: She is oriented to person, place, and time. She appears well-developed and well-nourished.  Non-toxic appearance. She does not have a sickly appearance. She does not appear ill. She appears distressed.   Elevated bp   HENT:   Head: Normocephalic and atraumatic.   Right Ear: External ear normal.   Left Ear: External ear normal.   Nose: Nose normal.   Mouth/Throat: Uvula is midline, oropharynx is clear and moist and mucous membranes are normal.   Eyes: Conjunctivae, EOM and lids are normal. Pupils are equal, round, and reactive to light.   Neck: Trachea normal and full passive range of motion without pain. Neck supple. No spinous process tenderness present.   Cardiovascular: Normal rate, regular rhythm, normal heart sounds and normal pulses.    Pulmonary/Chest: Effort normal and  breath sounds normal. She exhibits no tenderness, no bony tenderness and no deformity.   Abdominal: Soft. Normal appearance and bowel sounds are normal. She exhibits no distension. There is no tenderness. There is no rebound and no guarding.   Musculoskeletal: Normal range of motion.        Thoracic back: She exhibits normal range of motion, no tenderness, no bony tenderness and no pain.        Lumbar back: She exhibits normal range of motion, no tenderness, no bony tenderness and no pain.   Ext nt with full rom and nvi   Lymphadenopathy:     She has no cervical adenopathy.   Neurological: She is alert and oriented to person, place, and time. She has normal strength. No cranial nerve deficit or sensory deficit. GCS eye subscore is 4. GCS verbal subscore is 5. GCS motor subscore is 6.   Skin: Skin is warm, dry and intact. No rash noted.   Psychiatric: She has a normal mood and affect.   Nursing note and vitals reviewed.        MDM and ED Course     ED Medication Orders     Start Ordered     Status Ordering Provider    05/31/16 [phone] 05/31/16 0411  aspirin chewable tablet 324 mg  Once     Route: Oral  Ordered Dose: 324 mg     Last MAR action:  Given Glendal Cassaday    05/31/16 0412 05/31/16 0411  labetalol (NORMODYNE,TRANDATE) injection 10 mg  Once     Route: Intravenous  Ordered Dose: 10 mg     Last MAR action:  Given Quay Simkin    05/31/16 0218 05/31/16 0217  labetalol (NORMODYNE,TRANDATE) injection 10 mg  Once     Route: Intravenous  Ordered Dose: 10 mg     Last MAR action:  Given Ambrosio Reuter  05/31/16 0045 05/31/16 0044  0.9%  NaCl infusion  Continuous     Route: Intravenous     Last MAR action:  New Bag Mordechai Matuszak             MDM  Number of Diagnoses or Management Options  Chest pain, unspecified type:   Elevated brain natriuretic peptide (BNP) level:   Elevated d-dimer:   Hyperglycemia:   Hypertensive urgency:   Hyponatremia:   Renal insufficiency:   Shortness of breath:   Diagnosis  management comments: I, Dannielle Burn, MD, have been the primary provider for Jacqueline Weber during this Emergency Dept visit.    Oxygen saturation by pulse oximetry is 95%-100%, Normal.  Interventions: None Needed    Cp/sob in pt with known hx of mi with same- asa ord- w/u, support, med tele admit pending    EKG Interpretation:    Rhythm:  Normal Sinus  Rate:  Normal, 87  Axis:  Normal  Conduction:  No blocks  ST/T Segments:  Non spec st/t changes, flipped t in v4-6  Other: artifact    vq to follow as renal insuff so cannot do cta- bp control ongoing with iv agents- support- med tele admit pending    Labs Reviewed  COMPREHENSIVE METABOLIC PANEL - Abnormal; Notable for the following:      Glucose                       152 (*)                BUN                           38.6 (*)               Creatinine                    2.6 (*)                Sodium                        129 (*)                CO2                           16 (*)                 Albumin                       2.7 (*)                Albumin/Globulin Ratio        0.8 (*)             All other components within normal limits  CBC AND DIFFERENTIAL - Abnormal; Notable for the following:      Hgb                           11.8 (*)               Hematocrit                    35.6 (*)               MCV  67.4 (*)               MCH                           22.3 (*)               RDW                           17 (*)              All other components within normal limits  I[facility] D-DIMER - Abnormal; Notable for the following:      D-Dimer                       6.24 (*)            All other components within normal limits  B-TYPE NATRIURETIC PEPTIDE - Abnormal; Notable for the following:      B-Natriuretic Peptide         504.8 (*)            All other components within normal limits  MAGNESIUM  TROPONIN I  GFR    Radiology Results (24 Hour)     Procedure Component Value Units Date/Time    Chest 2 Views ([id number]) Collected:   05/31/16 0213    Order Status:  Completed Updated:  05/31/16 0218    Narrative:       TECHNIQUE:  PA and lateral chest    INDICATION: Chest pain    COMPARISON: 05/17/2016    FINDINGS:     Possible mild left basilar atelectasis.    No consolidation, edema, pneumothorax or pleural effusions.    Enlarged cardiopericardial silhouette, unchanged.    Moderate degenerative changes of the visualized spine.       Impression:         Possible mild left basilar atelectasis.    Loyal Buba, MD   05/31/2016 2:14 AM             Amount and/or Complexity of Data Reviewed  Clinical lab tests: ordered and reviewed  Tests in the radiology section of CPT: reviewed and ordered  Discuss the patient with other providers: yes (Dr Glory Buff to admit)          ED Course              Critical Care  Performed by: Dannielle Burn  Authorized by: Dannielle Burn     Critical care provider statement:     Critical care time (minutes):  30    Critical care start time:  05/31/2016 12:01 AM    Critical care end time:  05/31/2016 4:56 AM    Critical care time was exclusive of:  Separately billable procedures and treating other patients    Critical care was necessary to treat or prevent imminent or life-threatening deterioration of the following conditions:  Circulatory failure and cardiac failure    Critical care was time spent personally by me on the following activities:  Development of treatment plan with patient or surrogate, discussions with consultants, evaluation of patient's response to treatment, examination of patient, interpretation of cardiac output measurements, obtaining history from patient or surrogate, ordering and performing treatments and interventions, ordering and review of laboratory studies, ordering and review of radiographic studies, pulse oximetry and re-evaluation of patient's condition        Clinical Impression &  Disposition     Clinical Impression  Final diagnoses:   Hypertensive urgency   Chest pain, unspecified type    Shortness of breath   Hyperglycemia   Renal insufficiency   Hyponatremia   Elevated brain natriuretic peptide (BNP) level   Elevated d-dimer        ED Disposition     ED Disposition Condition Date/Time Comment    Observation  Wed May 31, 2016  5:05 AM Admitting Physician: Drinda Butts [[id number]]   Diagnosis: Chest pain [[id number]]   Estimated Length of Stay: < 2 midnights   Tentative Discharge Plan?: Home or Self Care [1]   Patient Class: Observation [104]             New Prescriptions    No medications on file                 Dannielle Burn, MD  05/31/16 [phone]

## 2016-05-31 NOTE — Progress Notes (Signed)
Patient is at the hospital today and have appointment with PCP [name],MD to f/u on 07/25/2016.Patient D/C from [facility] on 05/31/2016.

## 2016-05-31 NOTE — Progress Notes (Signed)
[facility].  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 [START ON 06/01/2016] amLODIPine  10 mg Oral Daily   . [START ON 06/01/2016] aspirin EC  325 mg Oral Daily   . carvedilol  25 mg Oral BID Meals   . cloNIDine  0.1 mg Oral BID   . hydrALAZINE  100 mg Oral BID   . [START ON 06/01/2016] losartan  25 mg Oral Daily   . simvastatin  10 mg Oral QHS   . SITagliptin  25 mg Oral Daily     Continuous Infusions:  . sodium chloride 75 mL/hr at 05/31/16 0922     PRN Meds:Nursing communication: Adult Hypoglycemia Treatment Algorithm **AND** dextrose **AND** dextrose **AND** glucagon (rDNA) **AND** Nursing communication: Document Significant Event Note, insulin lispro, insulin lispro, labetalol, morphine    Objective:  Vital signs in last 24 hours:  Temp:  [97 F (36.1 C)-98.2 F (36.8 C)] 97 F (36.1 C)  Heart Rate:  [68-86] 68  Resp Rate:  [12-23] 12  BP: (136-241)/(63-134) 136/63    Intake/Output from yesterday (07:01 - 07:00):  No intake/output data recorded.     Physical Exam:   Gen: WD WN NAD   CV: S1 S2 N RRR   Chest: CTAB   Ab: ND NT soft no HSM +BS   Ext: No C/E    Labs:    Recent Labs  Lab 05/31/16  0053   Glucose 152*   BUN 38.6*   Creatinine 2.6*   Calcium 9.3   Sodium 129*   Potassium 4.8   Chloride 101   CO2 16*   Albumin 2.7*   Magnesium 2.2       Recent Labs  Lab 05/31/16  0053   WBC 6.27   Hgb 11.8*   Hematocrit 35.6*   MCV 67.4*   MCH 22.3*   MCHC 33.1   RDW 17*   MPV 10.0   Platelets 257

## 2016-05-31 NOTE — Plan of Care (Signed)
Problem: Safety  Goal: Patient will be free from injury during hospitalization  Outcome: Progressing      Problem: Hemodynamic Status: Cardiac  Goal: Stable vital signs and fluid balance  Outcome: Progressing   05/31/16 2155   Goal/Interventions addressed this shift   Stable vital signs and fluid balance Monitor/assess vital signs and telemetry per unit protocol;Assess signs and symptoms associated with cardiac rhythm changes;Monitor intake/output per unit protocol and/or LIP order;Monitor lab values;Weigh on admission and record weight daily;Monitor for leg swelling/edema and report to LIP if abnormal

## 2016-05-31 NOTE — H&P (Signed)
[facility] HOSPITAL[facility]  H&P    Patient Info:   Date Time: 05/31/2016  8:09 AM   Patient Name:Jacqueline Weber   JME:[id number]    PCP: John Giovanni, MD   Admit Date:05/31/2016   Attending Physician:Allada, Leo Grosser, MD      Assessment and Plan:   1. Chest pain, shortness of breath, rule out MI/dissection/PE.  The patient was discharged from our service on May 19, 2016 following admission to the ICU for hypertensive urgency and myocardial infarction.    Labetalol prn for BP. May need a nitro drip if pressures dont improve.  The patient will be kept nothing by mouth for now in the event that the patient needs left heart cath.  Patient is due to have a left heart cath on May 18, 2016, but this was postponed due to renal dysfunction. She is scheduled for TEE this am to r/o dissection/clot burden as the patients D-dimer is grossly positive.    [facility] has been consulted, I spoke with Mickel Baas this morning.    Will get nephrology on board as well, Dr. Laddie Aquas. Dr. Kate Sable is rounding today.   Initial troponin trend x 2 is negative.  BNP is 504 however the patient's lung sounds are clear.  Echocardiogram from May 17, 2016 showed an ejection fraction of 65 percent.  Diastolic dysfunction.  Left ventricular hypertrophy.  Sclerotic aortic valve with moderate regurg.  Patient was last seen by Dr. Su Ley at [facility] approximately 2 days ago.  Medication adjustments were recommended but the patient has yet to initiate those adjustments.    2.  Elevated d-dimer, 6.24.  Patient is nothing by mouth for transesophageal echocardiogram this morning to rule out dissection.   V/Q scan is pending to rule out PE, unable to perform CTA due to renal dysfunction.    3.  AKI on CKD, creatinine 2.6 at baseline around 2 from last admission.  Nephrology consultation, Dr. Kate Sable    4.  Hyponatremia, sodium is 129, normal during last admission.   Check urine and serum osmolality along with random urine  sodium.      5.  Type 2 diabetes mellitus, chronic.  Hemoglobin A1c 6.3.  We will continue Januvia once taking by mouth.  In the meantime low-dose insulin sliding scale with before meals and at bedtime blood sugars.    6.  Hyperlipidemia on statin with lipid levels from September 12, LDL of 108, triglycerides of 153.  Otherwise normal.  Continue Zocor    7.  History of CVA, July 2014 without residual.    8.  Microcytic anemia, stable at 11.8 and 35.6.  Patient has not had a colonoscopy. Seen by Hematology, Dr. Wenda Overland during last admission when her H and H was markedly low.  Iron studies were normal.  It appears that the anemia secondary to chronic renal failure.  Outpatient follow-up.    9.  Metabolic acidosis, CO2 16, monitor.      Hospital Problems:  Active Problems:    Chest pain     DVT Prohylaxis:SEDs    Code Status: Prior   Disposition:home   Condition on admission and Prognosis: gaurded given recurrent features   Type of Admission:Observation   Estimated Length of Stay (including stay in the ER receiving treatment):  < 2 midnights   Medical Necessity for stay: Chest pain, shortness of breath.  Hypertensive urgency        Clinical Presentation   History of Presenting Illness:   Jacqueline Weber is  a 65 y.o. female who has history of History reviewed. No pertinent surgical history. Past Medical History:   Diagnosis Date   . Cataracts, bilateral    . Cerebrovascular accident    . Chronic kidney disease    . CVA (cerebral infarction) 03/2013   . Diabetes mellitus    . Hypercholesteremia    . Hypertension    . Myocardial infarction    Jacqueline Weber this 65 year old female with past medical history of hypertension, hyperlipidemia, diabetes, CVA and CKD who presents with shortness of breath and chest pain.  The patient was released from our service on September 15 following an ICU admission for hypertensive urgency and myocardial infarction.  The patient had been seen at [facility] prior to this admission,  May 10, 2016 complaining of chest pain.  She had a nuclear stress test done earlier in the year, showing inferolateral ischemia.  The patient was scheduled for left heart cath on May 18, 2016 however, preprocedure blood work revealed a creatinine of 1.9 so the cath was postponed pending Nephrology evaluation.  She presented to the emergency room on May 16, 2016.  She was profoundly hypertensive and had ST depression in lateral leads.  Her initial troponin was normal, but bumped to over 8.  The patient was taken to the ICU for blood pressure control and diuresis.  The patient was found to be anemic during this admission, likely due to CKD.  Both nephrology and hematology were consulted.  The patient was discharged home in medical managements and instructions to follow-up at [facility], which she did on Monday.  Medication adjustments were recommended but the patient has yet to initiate those adjustments.  She is presented back to [place] with chest pain and shortness of breath.     Her daughter is at the bedside and is acting as a Optometrist for her mother.  The patient denies headache, dizziness, visual changes, gait abnormality, abdominal pain, nausea, vomiting, diarrhea, constipation, or urinary symptoms.  She tells me that she has been taking her medications as prescribed.     Review of Systems:    Items that are highlighted in BOLD are positive:     Constitutional: negative for anorexia, chills, fatigue, fevers, malaise, night sweats, sweats and weight loss  Eyes: negative for cataracts, color blindness, contacts/glasses, glaucoma, icterus, irritation, redness and visual disturbance, photophobia  Ears, nose, mouth, throat, and face: negative for ear drainage, earaches, epistaxis, facial trauma, hearing loss, hoarseness, nasal congestion, snoring, sore mouth, sore throat, tinnitus and voice change, dysphagia, no hearing devices in use.   Respiratory: negative for asthma, chronic bronchitis,  cough, dyspnea on exertion, emphysema, hemoptysis, pleurisy/chest pain, pneumonia, sputum, stridor and wheezing, prior intubations, SOB.  Cardiovascular: negative for chest pain, chest pressure/discomfort (centrally located and non radiating), claudication, dyspnea, exertional chest pressure/discomfort, fatigue, irregular heart beat, lower extremity edema, near-syncope, orthopnea, palpitations, paroxysmal nocturnal dyspnea, exercise intolerance syncope and tachypnea  Gastrointestinal: negative for abdominal pain, change in bowel habits, constipation, diarrhea, dyspepsia, dysphagia, jaundice, melena, nausea, vomiting, odynophagia, reflux symptoms or hematochezia.  Genitourinary: negative for decreased stream, dysuria, frequency, hematuria, hesitancy, nocturia and urinary incontinence  Hematologic/lymphatic: negative for bleeding, easy bruising, lymphadenopathy and petechiae  Musculoskeletal: negative for arthralgias, back pain, bone pain, muscle weakness, myalgias, neck pain and stiff joints  Neurological: negative for coordination problems, dizziness, gait problems, headaches, memory problems, paresthesia, seizures, speech problems, tremors, vertigo and weakness  Behavioral/Psych: negative for abusive relationship, anxiety, depression, excessive alcohol consumption, fatigue, illegal drug usage,  mood swings and tobacco use  Endocrine: negative for diabetic symptoms including blurry vision, increased fatigue, polydipsia, polyphagia, polyuria, poor wound healing, pruritus, skin dryness and weight loss and temperature intolerance  Allergic/Immunologic: negative for anaphylaxis, angioedema, hay fever and urticaria       Vitals:   Vitals reviewed height is 1.524 m (5') and weight is 64.9 kg (143 lb). Her temporal artery temperature is 98.2 F (36.8 C). Her blood pressure is 179/79 and her pulse is 76. Her respiration is 20 and oxygen saturation is 97%. Body mass index is 27.93 kg/m.  Vitals:    05/31/16 0330 05/31/16  0400 05/31/16 0500 05/31/16 0633   BP: 190/88 (!) 229/95 182/79 179/79   Pulse: 80 80 76 76   Resp:       Temp:       TempSrc:       SpO2: 97% 97% 94% 97%   Weight:       Height:         Intake and Output Summary (Last 24 hours) at Date Time No intake or output data in the 24 hours ending 05/31/16 0809   Physical Exam:     Items that are highlighted in BOLD are positive:     General Appearance:    Non-English speaking.  Appears comfortable but reports ongoing chest pain at 3 out of 10.  Improvement in shortness of breath. Alert, cooperative, no distress, appears stated age   Head:    Normocephalic, without obvious abnormality, atraumatic   Eyes:    PERRL, conjunctiva/corneas clear, EOM's intact,   Ears:    Normal  external ear canals bilaterally. No obvious hearing loss.   Nose:   Nares normal, septum midline, mucosa normal, no drainage    or sinus tenderness   Throat:   Lips, mucosa, and tongue normal; teeth and gums normal   Neck:   Supple, symmetrical, trachea midline, no adenopathy;     thyroid:  no enlargement/tenderness/nodules; no carotid    bruit or JVD   Back:     Symmetric, no curvature, ROM normal, no CVA tenderness   Lungs:     Clear to auscultation bilaterally, respirations unlabored.  Dim in the bases,  no rales.     Chest Wall:    No tenderness or deformity    Heart:    Regular rate and rhythm, S1 and S2 normal, no murmur, rub   or gallop   Abdomen:     Soft, non-tender, bowel sounds active all four quadrants,     no masses, no organomegaly   Extremities:   Extremities normal, atraumatic, no cyanosis or edema   Pulses:   2+ and symmetric all extremities   Skin:   Skin color, texture, turgor normal, no rashes or lesions   Lymph nodes:   Cervical, supraclavicular, and axillary nodes normal   Neurologic:   CNII-XII intact, normal strength, sensation and reflexes     throughout              Clinical Information   Chief Complaint:  Chief Complaint   Patient presents with   . Chest Pain      Past Medical  History:  Past Medical History:   Diagnosis Date   . Cataracts, bilateral    . Cerebrovascular accident    . Chronic kidney disease    . CVA (cerebral infarction) 03/2013   . Diabetes mellitus    . Hypercholesteremia    . Hypertension    . Myocardial  infarction       Past Surgical History:History reviewed. No pertinent surgical history.   Family History:  Family History   Problem Relation Age of Onset   . No known problems Mother    . No known problems Father       Social History:  History   Alcohol Use No     History   Drug Use No     History   Smoking Status   . Never Smoker   Smokeless Tobacco   . Never Used     Social History     Social History   . Marital status: Widowed     Spouse name: N/A   . Number of children: N/A   . Years of education: N/A     Social History Main Topics   . Smoking status: Never Smoker   . Smokeless tobacco: Never Used   . Alcohol use No   . Drug use: No   . Sexual activity: Not Asked     Other Topics Concern   . None     Social History Narrative   . None      Allergies:  Allergies   Allergen Reactions   . Mosquito (Culex Pipiens) Allergy Skin Test Shortness Of Breath     And  Trouble  breathing   . Lisinopril    . Shellfish-Derived Products Hives     New  allergy  New  allergy   . Penicillins Rash     Swelling,  numbness      Medications:  (Not in a hospital admission)       Results of Labs/imaging   Labs have been reviewed:   Coagulation Profile:        CBC review:   Recent Labs  Lab 05/31/16  0053   WBC 6.27   Hgb 11.8*   Hematocrit 35.6*   Platelets 257   MCV 67.4*   RDW 17*   Neutrophils 62.7   Lymphocytes Automated 24.1   Eosinophils Automated 3.3   Immature Granulocyte 0.3   Neutrophils Absolute 3.93   Absolute Immature Granulocyte 0.02      Chem Review:  Recent Labs  Lab 05/31/16  0053   Sodium 129*   Potassium 4.8   Chloride 101   CO2 16*   BUN 38.6*   Creatinine 2.6*   Glucose 152*   Calcium 9.3   Magnesium 2.2   Bilirubin, Total 0.3   AST (SGOT) 19   ALT 9   Alkaline  Phosphatase 71      Results     Procedure Component Value Units Date/Time    Troponin I [[id number]] Collected:  05/31/16 0634    Specimen:  Blood Updated:  05/31/16 0702     Troponin I 0.03 ng/mL     B-type Natriuretic Peptide (BNP) [[id number]]  (Abnormal) Collected:  05/31/16 0053    Specimen:  Blood Updated:  05/31/16 0337     B-Natriuretic Peptide 504.8 (H) pg/mL     D-Dimer [[id number]]  (Abnormal) Collected:  05/31/16 0053     Updated:  05/31/16 0128     D-Dimer 6.24 (H) ug/mL FEU     Comprehensive metabolic panel [[id number]]  (Abnormal) Collected:  05/31/16 0053    Specimen:  Blood Updated:  05/31/16 0121     Glucose 152 (H) mg/dL      BUN 38.6 (H) mg/dL      Creatinine 2.6 (H) mg/dL      Sodium 129 (L) mEq/L  Potassium 4.8 mEq/L      Chloride 101 mEq/L      CO2 16 (L) mEq/L      Calcium 9.3 mg/dL      Protein, Total 6.2 g/dL      Albumin 2.7 (L) g/dL      AST (SGOT) 19 U/L      ALT 9 U/L      Alkaline Phosphatase 71 U/L      Bilirubin, Total 0.3 mg/dL      Globulin 3.5 g/dL      Albumin/Globulin Ratio 0.8 (L)     Anion Gap 12.0    Troponin I [[id number]] Collected:  05/31/16 0053    Specimen:  Blood Updated:  05/31/16 0121     Troponin I 0.01 ng/mL     GFR [[id number]] Collected:  05/31/16 0053     Updated:  05/31/16 0121     EGFR 18.5    Magnesium [[id number]] Collected:  05/31/16 0053    Specimen:  Blood Updated:  05/31/16 0121     Magnesium 2.2 mg/dL     CBC with differential [[id number]]  (Abnormal) Collected:  05/31/16 0053    Specimen:  Blood from Blood Updated:  05/31/16 0100     WBC 6.27 x10 3/uL      Hgb 11.8 (L) g/dL      Hematocrit 35.6 (L) %      Platelets 257 x10 3/uL      RBC 5.28 x10 6/uL      MCV 67.4 (L) fL      MCH 22.3 (L) pg      MCHC 33.1 g/dL      RDW 17 (H) %      MPV 10.0 fL      Neutrophils 62.7 %      Lymphocytes Automated 24.1 %      Monocytes 9.1 %      Eosinophils Automated 3.3 %      Basophils Automated 0.5 %      Immature Granulocyte 0.3 %      Nucleated RBC 0.0 /100 WBC       Neutrophils Absolute 3.93 x10 3/uL      Abs Lymph Automated 1.51 x10 3/uL      Abs Mono Automated 0.57 x10 3/uL      Abs Eos Automated 0.21 x10 3/uL      Absolute Baso Automated 0.03 x10 3/uL      Absolute Immature Granulocyte 0.02 x10 3/uL      Absolute NRBC 0.00 x10 3/uL          Radiology reports have been reviewed:  Radiology Results (24 Hour)     Procedure Component Value Units Date/Time    NM Pulmonary Vent/Perf (VQ Scan) [[id number]] Resulted:  05/31/16 0804    Order Status:  Sent Updated:  05/31/16 0805    Chest 2 Views [[id number]] Collected:  05/31/16 0213    Order Status:  Completed Updated:  05/31/16 0218    Narrative:       TECHNIQUE:  PA and lateral chest    INDICATION: Chest pain    COMPARISON: 05/17/2016    FINDINGS:     Possible mild left basilar atelectasis.    No consolidation, edema, pneumothorax or pleural effusions.    Enlarged cardiopericardial silhouette, unchanged.    Moderate degenerative changes of the visualized spine.       Impression:         Possible mild left basilar atelectasis.    Faris  Vena Rua, MD   05/31/2016 2:14 AM           EKG: EKG reviewed , ST depression in the anterolateral leads.  The EKG is not great.  Will repeat.     Procedures:Procedures       Hospitalist   Signed by: Jerolyn Shin   05/31/2016 8:09 AM

## 2016-05-31 NOTE — Consults (Addendum)
[facility]    Date Time: 05/31/16 9:33 AM  Patient Name: Jacqueline Weber  Requesting Physician: Dahlia Byes, MD       Reason for Consultation:   Chest pain, hypertensive crisis.      History:   Jacqueline Weber is a 65 y.o. female admitted on 05/31/2016.  We have been asked by Dahlia Byes, MD,  to provide cardiac consultation, regarding chest pain and hypertensive crisis.  Patient was just here at [facility] last week after presenting with a hypertensive crisis.  She was in CHF at the time and required IV Lasix.  She had a Type II MI, with a peak TnI of 8.03.  She was recommended cath but she had renal insufficiency with a Scr of around 2.0.  She was started on a multi-drug regimen - she had follow up in our office two days ago and her BP was better.  Home BP readings with a visiting RN revealed labile but improved BP.  Yesterday however she developed chest pressure and acute SOB again.  She had been taking her medications by patient report.  Her initial BP was markedly elevated at 237/103, HR 86.  It did come down with 2 doses of IV Labetalol but it has now gone back up to 233/103.  She is currently still SOB and is still having mild chest pain in the lower aspect of her chest.  Denies back pain or shoulder pain.    Past Medical History:     Past Medical History:   Diagnosis Date   . Cataracts, bilateral    . Cerebrovascular accident    . Chronic kidney disease    . CVA (cerebral infarction) 03/2013   . Diabetes mellitus    . Hypercholesteremia    . Hypertension    . Myocardial infarction        Past Surgical History:   History reviewed. No pertinent surgical history.    Family History:     Family History   Problem Relation Age of Onset   . No known problems Mother    . No known problems Father        Social History:     Social History     Social History   . Marital status: Widowed     Spouse name: N/A   . Number of children: N/A   . Years of education: N/A      Social History Main Topics   . Smoking status: Never Smoker   . Smokeless tobacco: Never Used   . Alcohol use No   . Drug use: No   . Sexual activity: Not on file     Other Topics Concern   . Not on file     Social History Narrative   . No narrative on file       Allergies:     Allergies   Allergen Reactions   . Mosquito (Culex Pipiens) Allergy Skin Test Shortness Of Breath     And  Trouble  breathing   . Lisinopril    . Shellfish-Derived Products Hives     New  allergy  New  allergy   . Penicillins Rash     Swelling,  numbness       Medications:     (Not in a hospital admission)      Current Facility-Administered Medications   Medication Dose Route Frequency Provider Last Rate Last Dose   . 0.9%  NaCl infusion  Intravenous Continuous Jerolyn Shin, NP 75 mL/hr at 05/31/16 [phone]     . [START ON 06/01/2016] amLODIPine (NORVASC) tablet 10 mg  10 mg Oral Daily Jerolyn Shin, NP       . Derrill Memo ON 06/01/2016] aspirin EC EC tablet 325 mg  325 mg Oral Daily Jerolyn Shin, NP       . carvedilol (COREG) tablet 25 mg  25 mg Oral BID Meals Jerolyn Shin, NP       . cloNIDine (CATAPRES) tablet 0.1 mg  0.1 mg Oral BID Jerolyn Shin, NP       . dextrose (GLUCOSE) 40 % oral gel 15 g of glucose  15 g of glucose Oral PRN Jerolyn Shin, NP        And   . dextrose (D10W) 10% bolus 125 mL  125 mL Intravenous PRN Jerolyn Shin, NP        And   . glucagon (rDNA) (GLUCAGEN) injection 1 mg  1 mg Intramuscular PRN Jerolyn Shin, NP       . furosemide (LASIX) injection 20 mg  20 mg Intravenous Once Ozella Almond, MD       . hydrALAZINE (APRESOLINE) tablet 100 mg  100 mg Oral BID Jerolyn Shin, NP       . insulin lispro (HumaLOG) injection 1-3 Units  1-3 Units Subcutaneous QHS PRN Jerolyn Shin, NP       . insulin lispro (HumaLOG) injection 1-5 Units  1-5 Units Subcutaneous TID AC PRN Jerolyn Shin, NP        . labetalol (NORMODYNE,TRANDATE) injection 10 mg  10 mg Intravenous Q6H PRN Jerolyn Shin, NP       . Derrill Memo ON 06/01/2016] losartan (COZAAR) tablet 25 mg  25 mg Oral Daily Jerolyn Shin, NP       . morphine injection 2 mg  2 mg Intravenous Q4H PRN Hartojo, Wibisono, MD   2 mg at 05/31/16 0615   . simvastatin (ZOCOR) tablet 10 mg  10 mg Oral QHS Jerolyn Shin, NP       . SITagliptin (JANUVIA) tablet 25 mg  25 mg Oral Daily Jerolyn Shin, NP         Current Outpatient Prescriptions   Medication Sig Dispense Refill   . amLODIPine (NORVASC) 10 MG tablet Take 1 tablet (10 mg total) by mouth daily.     [name] aspirin EC 325 MG EC tablet Take 1 tablet (325 mg total) by mouth daily. 30 tablet 0   . carvedilol (COREG) 25 MG tablet Take 25 mg by mouth 2 (two) times daily with meals.        . cloNIDine (CATAPRES) 0.1 MG tablet Take 0.1 mg by mouth 2 (two) times daily.     . famotidine (PEPCID) 20 MG tablet Take 1 tablet (20 mg total) by mouth daily. 30 tablet 0   . fluticasone (FLONASE) 50 MCG/ACT nasal spray 1 spray by Nasal route daily.     . hydrALAZINE (APRESOLINE) 100 MG tablet Take 1 tablet (100 mg total) by mouth 2 (two) times daily. 60 tablet 0   . losartan (COZAAR) 25 MG tablet Take 1 tablet (25 mg total) by mouth daily. 30 tablet 0   . simvastatin (ZOCOR) 10 MG tablet Take 10 mg by mouth nightly.     [name] SITagliptin (JANUVIA) 25 MG tablet Take 1 tablet (25 mg total) by mouth daily. 30 tablet 3   .  vitamin D, ergocalciferol, (DRISDOL) 50000 UNIT Cap Take 2,000 Units by mouth 2 (two) times daily.           Review of Systems:    Comprehensive review of systems including constitutional, eyes, ears, nose, mouth, throat, cardiovascular, GI, GU, musculoskeletal, integumentary, respiratory, neurologic, psychiatric, and endocrine is negative other than what is mentioned already in the history of present illness    Physical Exam:     Vitals:    05/31/16 0930   BP:    Pulse:    Resp:  12   Temp: 97 F (36.1 C)   SpO2:      Temp (24hrs), Avg:97.6 F (36.4 C), Min:97 F (36.1 C), Max:98.2 F (36.8 C)      Intake and Output Summary (Last 24 hours) at Date Time  No intake or output data in the 24 hours ending 05/31/16 0933    GENERAL: Patient is in no acute distress   HEENT: No scleral icterus or conjunctival pallor, moist mucous membranes   NECK: No jugular venous distention or thyromegaly, normal carotid upstrokes without bruits   CARDIAC: Normal apical impulse, regular rate and rhythm, with normal S1 and S2, and no murmurs, rubs, or gallops   CHEST: Clear to auscultation bilaterally, normal respiratory effort  ABDOMEN: No abdominal bruits, masses, or hepatosplenomegaly, nontender, non-distended, good bowel sounds   EXTREMITIES: No clubbing, cyanosis, or edema, 2+ DP, PT, radial, and carotid, as well as femoral pulses bilaterally without bruits  SKIN: No rash or jaundice   NEUROLOGIC: Alert and oriented to time, place and person, normal mood and affect  MUSCULOSKELETAL: Normal muscle strength and tone.      Labs Reviewed:       Recent Labs  Lab 05/31/16  0634 05/31/16  0053   Troponin I 0.03 0.01               Recent Labs  Lab 05/31/16  0053   Bilirubin, Total 0.3   Protein, Total 6.2   Albumin 2.7*   ALT 9   AST (SGOT) 19       Recent Labs  Lab 05/31/16  0053   Magnesium 2.2           Recent Labs  Lab 05/31/16  0053   WBC 6.27   Hgb 11.8*   Hematocrit 35.6*   Platelets 257       Recent Labs  Lab 05/31/16  0053   Sodium 129*   Potassium 4.8   Chloride 101   CO2 16*   BUN 38.6*   Creatinine 2.6*   EGFR 18.5   Glucose 152*   Calcium 9.3     EKG - NSR with poor R wave progression - ST segment depressions noted in lateral leads, inferior leads.  Not too different from last EKG last week.    Radiology   Radiological Procedure reviewed.      chest X-ray  Assessment:    Recurrent hypertensive crisis with chest pain, possible CHF   Widened mediastinum in CXR   Worsened renal insufficiency, Scr up to  2.6   Type II MI in setting of hypertensive crisis last week, TnI went up to 8.03 - cath deferred due to renal insufficiency   Abnormal OP nuclear stress test findings, February 2017, nuclear stress test, small-sized, mild intensity inferoapical ischemia, EF 72%.   February 2017, echo, EF 70%, concentric LVH, grade I diastolic dysfunction,    mild AI.      Recommendations:  Admit to ICU for tx of hypertensive crisis   Dose Lasix will be given, 20 mg IV to start with   TEE in ICU to rule out aortic pathology, although pain does not appear typical of aortic pathology   Renal evaluation   Probable inpatient cath when patient more stable, hopefully renal function more stable    Addendum - TEE done at bedside in ICU - findings as follows:    1. Hyperdynamic LV with EF around 70%, LVH present  2. Mild aortic and mitral insufficiency  3. Normal sized aorta with no evidence for dissection of the ascending or descending sections.  Mild non-mobile plaque noted.  Aortic arch visualized in very limited views, no clear pathology noted  4. No LAA clot  5. Intact atrial septum, no shunt by color flow    Plan - BP control  Appreciate renal input - will have to watch Cr and see when she can do cath.  Will need input from interventional cardiology service as well  Will see if intervention will be needed on renal arteries.            Signed by: Ozella Almond, MD      [facility]  NP [place] (8am-5pm)  MD Spectralink [phone] (8am-5pm)  After hours, non urgent consult line [phone]  After Hours, urgent consults [phone]

## 2016-05-31 NOTE — Progress Notes (Signed)
Patient is at the hospital today and have appointment with PCP [name],MD to f/u.D/C from [facility] on 05/31/2016.

## 2016-05-31 NOTE — Plan of Care (Signed)
Problem: Pain  Goal: Pain at adequate level as identified by patient  Outcome: Progressing   05/31/16 1533   Goal/Interventions addressed this shift   Pain at adequate level as identified by patient Identify patient comfort function goal;Evaluate if patient comfort function goal is met   Denies pain at this time.    Problem: Hemodynamic Status: Cardiac  Goal: Stable vital signs and fluid balance  Outcome: Progressing  Admitted to ICU for Hypertensive crisis. TEE performed by Dr. Ortencia Kick at bedside with anesthesia and R and echo tech. Patient tolerated procedure well. Patient's son is at bedside. Patient understands English. Daughter and son also updated on plan of care.    05/31/16 1533   Goal/Interventions addressed this shift   Stable vital signs and fluid balance Monitor/assess vital signs and telemetry per unit protocol;Weigh on admission and record weight daily;Assess signs and symptoms associated with cardiac rhythm changes;Monitor intake/output per unit protocol and/or LIP order;Monitor lab values;Monitor for leg swelling/edema and report to LIP if abnormal

## 2016-05-31 NOTE — Transfer of Care (Signed)
Anesthesia Transfer of Care Note    Patient: Jacqueline Weber    Procedures performed: * No procedures listed *    Anesthesia type: General TIVA    Patient location:ICU    Last vitals:   Vitals:    05/31/16 1425   BP: 122/49   Pulse: 66   Resp: 18   Temp:    SpO2: 99%       Post pain: Patient not complaining of pain, continue current therapy      Mental Status:awake    Respiratory Function: tolerating nasal cannula    Cardiovascular: stable    Nausea/Vomiting: patient not complaining of nausea or vomiting    Hydration Status: adequate    Post assessment: no apparent anesthetic complications    Signed by: Matthias Hughs Lilyannah Zuelke  05/31/16 2:25 PM

## 2016-06-01 ENCOUNTER — Inpatient Hospital Stay: Payer: Medicare Other

## 2016-06-01 LAB — MAGNESIUM: Magnesium: 2.2 mg/dL (ref 1.6–2.6)

## 2016-06-01 LAB — ECG 12-LEAD
Atrial Rate: 72 {beats}/min
P Axis: 37 degrees
P-R Interval: 172 ms
Q-T Interval: 428 ms
QRS Duration: 80 ms
QTC Calculation (Bezet): 468 ms
R Axis: 28 degrees
T Axis: 68 degrees
Ventricular Rate: 72 {beats}/min

## 2016-06-01 LAB — COMPREHENSIVE METABOLIC PANEL
ALT: <6 U/L (ref 0–55)
AST (SGOT): 23 U/L (ref 5–34)
Albumin/Globulin Ratio: 0.7 — ABNORMAL LOW (ref 0.9–2.2)
Albumin: 2.1 g/dL — ABNORMAL LOW (ref 3.5–5.0)
Alkaline Phosphatase: 55 U/L (ref 37–106)
Anion Gap: 9 (ref 5.0–15.0)
BUN: 32 mg/dL — ABNORMAL HIGH (ref 7.0–19.0)
Bilirubin, Total: 0.2 mg/dL (ref 0.2–1.2)
CO2: 16 mEq/L — ABNORMAL LOW (ref 22–29)
Calcium: 8.1 mg/dL — ABNORMAL LOW (ref 8.5–10.5)
Chloride: 111 mEq/L (ref 100–111)
Creatinine: 2.4 mg/dL — ABNORMAL HIGH (ref 0.6–1.0)
Globulin: 2.9 g/dL (ref 2.0–3.6)
Glucose: 115 mg/dL — ABNORMAL HIGH (ref 70–100)
Potassium: 5.1 mEq/L (ref 3.5–5.1)
Protein, Total: 5 g/dL — ABNORMAL LOW (ref 6.0–8.3)
Sodium: 136 mEq/L (ref 136–145)

## 2016-06-01 LAB — GFR: EGFR: 20.3

## 2016-06-01 LAB — GLUCOSE WHOLE BLOOD - POCT
Whole Blood Glucose POCT: 121 mg/dL — ABNORMAL HIGH (ref 70–100)
Whole Blood Glucose POCT: 175 mg/dL — ABNORMAL HIGH (ref 70–100)
Whole Blood Glucose POCT: 151 mg/dL — ABNORMAL HIGH (ref 70–100)
Whole Blood Glucose POCT: 126 mg/dL — ABNORMAL HIGH (ref 70–100)

## 2016-06-01 MED ORDER — INSULIN LISPRO 100 UNIT/ML SC SOLN
1.0000 [IU] | Freq: Every evening | SUBCUTANEOUS | Status: DC
Start: 2016-06-01 — End: 2016-06-08
  Administered 2016-06-02 – 2016-06-04 (×2): 1 [IU] via SUBCUTANEOUS
  Filled 2016-06-01 (×2): qty 3

## 2016-06-01 MED ORDER — NITROGLYCERIN IN D5W 200-5 MCG/ML-% IV SOLN
10.0000 ug/min | INTRAVENOUS | Status: DC
Start: 2016-06-01 — End: 2016-06-02
  Administered 2016-06-01: 20 ug/min via INTRAVENOUS
  Administered 2016-06-02: 80 ug/min via INTRAVENOUS
  Filled 2016-06-01 (×2): qty 250

## 2016-06-01 MED ORDER — LABETALOL HCL 5 MG/ML IV SOLN
10.0000 mg | Freq: Four times a day (QID) | INTRAVENOUS | Status: DC | PRN
Start: 2016-06-01 — End: 2016-06-08
  Administered 2016-06-01 – 2016-06-06 (×3): 10 mg via INTRAVENOUS
  Filled 2016-06-01 (×3): qty 4

## 2016-06-01 MED ORDER — INSULIN LISPRO 100 UNIT/ML SC SOLN
1.0000 [IU] | Freq: Three times a day (TID) | SUBCUTANEOUS | Status: DC
Start: 2016-06-01 — End: 2016-06-08
  Administered 2016-06-01 – 2016-06-03 (×4): 1 [IU] via SUBCUTANEOUS
  Administered 2016-06-04: 2 [IU] via SUBCUTANEOUS
  Administered 2016-06-08: 1 [IU] via SUBCUTANEOUS
  Filled 2016-06-01 (×3): qty 3
  Filled 2016-06-01: qty 6
  Filled 2016-06-01 (×2): qty 3

## 2016-06-01 MED ORDER — HEPARIN SODIUM (PORCINE) 5000 UNIT/ML IJ SOLN
5000.0000 [IU] | Freq: Two times a day (BID) | INTRAMUSCULAR | Status: DC
Start: 2016-06-01 — End: 2016-06-08
  Administered 2016-06-01 – 2016-06-08 (×14): 5000 [IU] via SUBCUTANEOUS
  Filled 2016-06-01 (×14): qty 1

## 2016-06-01 MED ORDER — HYDRALAZINE HCL 50 MG PO TABS
100.0000 mg | ORAL_TABLET | Freq: Three times a day (TID) | ORAL | Status: DC
Start: 2016-06-02 — End: 2016-06-08
  Administered 2016-06-02 – 2016-06-08 (×20): 100 mg via ORAL
  Filled 2016-06-01: qty 4
  Filled 2016-06-01: qty 2
  Filled 2016-06-01: qty 4
  Filled 2016-06-01: qty 2
  Filled 2016-06-01: qty 4
  Filled 2016-06-01 (×6): qty 2
  Filled 2016-06-01: qty 4
  Filled 2016-06-01 (×8): qty 2

## 2016-06-01 MED ORDER — HYDRALAZINE HCL 20 MG/ML IJ SOLN
10.0000 mg | Freq: Four times a day (QID) | INTRAMUSCULAR | Status: DC | PRN
Start: 2016-06-01 — End: 2016-06-08
  Administered 2016-06-01 – 2016-06-06 (×3): 10 mg via INTRAVENOUS
  Filled 2016-06-01 (×4): qty 1

## 2016-06-01 MED ORDER — DOCUSATE SODIUM 100 MG PO CAPS
200.0000 mg | ORAL_CAPSULE | Freq: Every day | ORAL | Status: DC
Start: 2016-06-01 — End: 2016-06-03
  Administered 2016-06-01 – 2016-06-03 (×3): 200 mg via ORAL
  Filled 2016-06-01 (×4): qty 2

## 2016-06-01 MED ORDER — HYDROCHLOROTHIAZIDE 25 MG PO TABS
25.0000 mg | ORAL_TABLET | Freq: Every day | ORAL | Status: DC
Start: 2016-06-01 — End: 2016-06-08
  Administered 2016-06-01 – 2016-06-08 (×8): 25 mg via ORAL
  Filled 2016-06-01 (×8): qty 1

## 2016-06-01 NOTE — Progress Notes (Addendum)
[place]    Date Time: 06/01/16 8:58 AM  Patient Name: Jacqueline Weber       Patient Active Problem List   Diagnosis   . Type 2 diabetes mellitus without complication   . HTN (hypertension) urgency   . Hyperlipidemia   . History of CVA (cerebrovascular accident)   . Chest pain, unspecified type   . SOB (shortness of breath)   . Nausea   . Elevated d-dimer   . Hypertensive emergency   . Elevated brain natriuretic peptide (BNP) level   . Hyponatremia   . Renal insufficiency   . Chest pain       Assessment:      Recurrent hypertensive crisis with chest pain, possible CHF   Widened mediastinum in CXR   Worsened renal insufficiency, Scr up to 2.6   Type II MI in setting of hypertensive crisis last week, TnI went up to 8.03 - cath deferred due to renal insufficiency   Abnormal OP nuclear stress test findings, February 2017, nuclear stress test, small-sized, mild intensity inferoapical ischemia, EF 72%.   February 2017, echo, EF 70%, concentric LVH, grade I diastolic dysfunction,    mild AI.      Recommendations:    Will await am meds but consider Procardia if still not controlled.   Cath when creatinine is stabilizes.    Patient seen and examined, my assessment and plans as noted above.    Signed by    Ladell Pier, MD      Medications:      Scheduled Meds: PRN Meds:      amLODIPine 10 mg Oral Daily   aspirin EC 325 mg Oral Daily   carvedilol 25 mg Oral BID Meals   cloNIDine 0.1 mg Oral BID   hydrALAZINE 100 mg Oral BID   losartan 100 mg Oral Daily   simvastatin 10 mg Oral QHS   SITagliptin 25 mg Oral Daily       Continuous Infusions:  . sodium chloride 30 mL/hr at 05/31/16 1710      dextrose 15 g of glucose PRN   And     dextrose 125 mL PRN   And     glucagon (rDNA) 1 mg PRN   insulin lispro 1-3 Units QHS PRN   insulin lispro 1-5 Units TID AC PRN   labetalol 10 mg Q6H PRN   morphine 2 mg Q4H PRN             Subjective:   Denies chest pain, SOB or  palpitations.      Physical Exam:     Vitals:    06/01/16 0600   BP: 170/66   Pulse: 76   Resp: 17   Temp: 98.7 F (37.1 C)   SpO2: 96%     Temp (24hrs), Avg:97.9 F (36.6 C), Min:97 F (36.1 C), Max:98.7 F (37.1 C)      Telemetry reviewed no changes SR     Intake and Output Summary (Last 24 hours) at Date Time    Intake/Output Summary (Last 24 hours) at 06/01/16 0858  Last data filed at 06/01/16 0600   Gross per 24 hour   Intake             1009 ml   Output              900 ml   Net              109 ml  General Appearance:  Breathing comfortable, no acute distress  Head:  normocephalic  Eyes:  EOM's intact  Neck:  No carotid bruit or jugular venous distension  Lungs:  Clear to auscultation throughout, no wheezes, rhonchi or rales, good respiratory effort   Chest Wall:  No tenderness or deformity  Heart:  S1, S2 normal, no S3, no S4, no murmur  Abdomen:  Soft, non-tender, positive bowel sounds  Extremities:  No cyanosis, clubbing or edema  Pulses:  Equal radial pulses, 4/4 symmetric  Neurologic:  Alert and oriented x3, mood and affect normal  Musculoskeletal: normal strength and tone    Labs:     Recent Labs  Lab 05/31/16  1409 05/31/16  0634 05/31/16  0053   Troponin I 0.03 0.03 0.01               Recent Labs  Lab 06/01/16  0351   Bilirubin, Total 0.2   Protein, Total 5.0*   Albumin 2.1*   ALT <6   AST (SGOT) 23       Recent Labs  Lab 06/01/16  0351   Magnesium 2.2           Recent Labs  Lab 05/31/16  0053   WBC 6.27   Hgb 11.8*   Hematocrit 35.6*   Platelets 257       Recent Labs  Lab 06/01/16  0351 05/31/16  0053   Sodium 136 129*   Potassium 5.1 4.8   Chloride 111 101   CO2 16* 16*   BUN 32.0* 38.6*   Creatinine 2.4* 2.6*   EGFR 20.3 18.5   Glucose 115* 152*   Calcium 8.1* 9.3           Invalid input(s): FREET4    .  Lab Results   Component Value Date    BNP 504.8 (H) 05/31/2016      Estimated Creatinine Clearance: 20.1 mL/min (based on SCr of 2.4 mg/dL).    Weight Monitoring 05/17/2016 05/18/2016  05/19/2016 05/22/2016 05/25/2016 05/30/2016 05/31/2016   Height - - - 154.9 cm 152.4 cm 152.4 cm -   Height Method - - - - - Stated -   Weight 66.7 kg 66.452 kg 66.543 kg 64.683 kg 65.318 kg 64.864 kg 64.4 kg   Weight Method Bed Scale Bed Scale Bed Scale - - Stated Bed Scale   BMI (calculated) - - - 27 kg/m2 28.2 kg/m2 28 kg/m2 -         Imaging:   Radiological Procedure reviewed.              Signed by: Carmel Sacramento, NP        [facility]  NP [place] (8am-5pm)  MD Spectralink [phone] (8am-5pm)  After hours, non urgent consult line [phone]  After Hours, urgent consults [phone]

## 2016-06-01 NOTE — Progress Notes (Signed)
[facility].  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 aspirin EC  325 mg Oral Daily   . carvedilol  25 mg Oral BID Meals   . cloNIDine  0.1 mg Oral BID   . hydrALAZINE  100 mg Oral BID   . hydroCHLOROthiazide  25 mg Oral Daily   . insulin lispro  1-3 Units Subcutaneous QHS   . insulin lispro  1-5 Units Subcutaneous TID AC   . losartan  100 mg Oral Daily   . simvastatin  10 mg Oral QHS   . SITagliptin  25 mg Oral Daily     Continuous Infusions:     PRN Meds:Nursing communication: Adult Hypoglycemia Treatment Algorithm **AND** dextrose **AND** dextrose **AND** glucagon (rDNA) **AND** Nursing communication: Document Significant Event Note, hydrALAZINE, labetalol, morphine    Objective:  Vital signs in last 24 hours:  Temp:  [98 F (36.7 C)-98.7 F (37.1 C)] 98 F (36.7 C)  Heart Rate:  [66-82] 76  Resp Rate:  [14-22] 17  BP: (139-206)/(52-85) 170/66    Intake/Output from yesterday (07:01 - 07:00):  09/27 0701 - 09/28 0700  In: 0037 [P.O.:120; I.V.:889]  Out: 900 [Urine:900]     Physical Exam:   Gen: WD WN NAD   CV: S1 S2 N RRR   Chest: CTAB   Ab: ND NT soft no HSM +BS   Ext: No C/E    Labs:    Recent Labs  Lab 06/01/16  0351 05/31/16  0053   Glucose 115* 152*   BUN 32.0* 38.6*   Creatinine 2.4* 2.6*   Calcium 8.1* 9.3   Sodium 136 129*   Potassium 5.1 4.8   Chloride 111 101   CO2 16* 16*   Albumin 2.1* 2.7*   Magnesium 2.2 2.2       Recent Labs  Lab 05/31/16  0053   WBC 6.27   Hgb 11.8*   Hematocrit 35.6*   MCV 67.4*   MCH 22.3*   MCHC 33.1   RDW 17*   MPV 10.0   Platelets 257

## 2016-06-01 NOTE — UM Notes (Signed)
05/31/16 328pm : IP admit day zero. Per PA 05/31/16 : Pt was transferred to ICU with hypertensive emergency/crisis.  Change to IP.          65 yo female to ED 05/31/16 0045 with chest pain and SOB.  98.2, HR 86, RR 23, BP 237/103 - 231/105 - 241/134 - 238/101, sats 98%  PMH : cataracts, CVA, DM, high chol, HTN, STEMI last week  H&H 11.8/35.6, Glucose 152, BUN 38.6, Cr 2.6, Na 129, Co2 16, Ddimer 6.24, BNP 504.8  CXRY : Possible mild left basilar atelectasis.  VQ scan : low prob PE  ECHO : 1. No evidence of an aortic dissection 2. Dynamic left ventricular function with an EF of 70%  3. Adequately functioning cardiac valves  ABN EKG : NORMAL SINUS RHYTHM POSSIBLE ANTERIOR MYOCARDIAL INFARCTION , AGE UNDETERMINED ST & T WAVE ABNORMALITY, CONSIDER INFEROLATERAL ISCHEMIA  In ED : 10mg  IV Labetalol xs 2, 324mg  po ASA, 2mg  IV Morphine, 10mg  IV Hydralazine, 20mg  IV Lasix  Admit : HTNsive urgency with CP and SOB, RI, Hyponatremia, Elevated BNP and Ddimer, r/o PE    MD assessment/plan :  1. Chest pain in a patient who recently had a non-STEMI last week with abnormal stress test.  The patient's firdt 2 sets of troponins have been negative.  No acute changes on EKG concerning for MI.  I am more concerned that this patient has chest pain secondary to hypertensive crisis and we need to rule out aortic dissection in this patient who has elevated d-dimer.  However, we are unable to get CT and a gram of the chest due to her creatinine being elevated at 2.6.  Plan would be to transfer this patient to ICU for anti-tensive drip, most likely with labetalol for better control of blood pressure and subsequently needs transesophageal echocardiogram for evaluation for possible aortic dissection and being fully aware that it is not the choice of test, but at least we have some idea.  The patient ideally needs a cardiac cath and had extensive discussion with both the patient and the daughter regarding the risks and benefits and they said they  will decide after discussing with cardiology and nephrology about proceeding with a cardiac cath.    2.  Shortness of breath and elevated d-dimer; will need to rule out PE. Patient will have a VQ scan.  We are unable to obtain CT and a gram of the chest.  Due to the patient's acute kidney injury.    3.  Hyponatremia; will check urine and serum osmolality along with random urine sodium.    4.  Hypertensive crisis; will start oral medications along with IV drip for better control of blood pressure and transfer the patient to ICU.  I discussed the case with Dr. Bonnye Fava who accepted the patient to the ICU  DVT Prohylaxis:lovenox     OBS then IP admit order 05/31/16, ICU, po ASA, Coreg, Clonidine, Hydralazine, SC HumLaog, po Losartan, Zocor, Januvia, prn IV Hydralazine, prn IV Labetalol, prn IV Morphine, diet as tol, Cardio, Nephro, TEE, prob cardiac cath, .9% NaCl infusion 79ml/h cont, activity as tol, PTOT, VS q4

## 2016-06-01 NOTE — Progress Notes (Addendum)
[facility] Note     ICU Daily Progress Note        Date Time: 06/01/16 1:13 AM  Patient Name: Jacqueline Weber  Attending Physician: Nino Parsley, MD  Room: IC08/IC08-A   Admit Date: 05/31/2016  LOS: 1 day            Assessment:     Patient Active Problem List   Diagnosis   . Type 2 diabetes mellitus without complication   . HTN (hypertension) urgency   . Hyperlipidemia   . History of CVA (cerebrovascular accident)   . Chest pain, unspecified type   . SOB (shortness of breath)   . Nausea   . Elevated d-dimer   . Hypertensive emergency   . Elevated brain natriuretic peptide (BNP) level   . Hyponatremia   . Renal insufficiency   . Chest pain     Patient admitted with severe hypertension, question poor compliance  TEEchocardiogram shows no aortic dissection    Plan:   Anti-hypertensive treatment as outlined  Intensive care unit monitoring due to severe hypertension    Subjective:   Responsive, no acute distress    Medications:       Scheduled Meds: PRN Meds:      amLODIPine 10 mg Oral Daily   aspirin EC 325 mg Oral Daily   carvedilol 25 mg Oral BID Meals   cloNIDine 0.1 mg Oral BID   hydrALAZINE 100 mg Oral BID   losartan 100 mg Oral Daily   simvastatin 10 mg Oral QHS   SITagliptin 25 mg Oral Daily       Continuous Infusions:  . sodium chloride 30 mL/hr at 05/31/16 1710      dextrose 15 g of glucose PRN   And     dextrose 125 mL PRN   And     glucagon (rDNA) 1 mg PRN   insulin lispro 1-3 Units QHS PRN   insulin lispro 1-5 Units TID AC PRN   labetalol 10 mg Q6H PRN   morphine 2 mg Q4H PRN             Physical Exam:     Vitals:    05/31/16 2100 05/31/16 2225 05/31/16 2300 06/01/16 0000   BP: 166/60 170/59 146/55 181/64   Pulse: 76 73 66 70   Resp: 18 18 16 17    Temp:       TempSrc:       SpO2: 98% 97% 97% 97%   Weight:       Height:         Temp (24hrs), Avg:97.5 F (36.4 C), Min:97 F (36.1 C), Max:98 F (36.7 C)           09/27 0701 - 09/28 0700  In: 769 [P.O.:120; I.V.:649]  Out:  900 [Urine:900]       General Appearance: Comfortable  Mental status: Alert  Neuro:  Intact  H & N: No focal  Lungs: Clear  Cardiac: Regular  Abdomen:  Soft  Extremities: No CCE  Skin: No rash      Data:         Labs:     Recent CBC   Recent Labs  Lab 05/31/16  0053   WBC 6.27   RBC 5.28   Hgb 11.8*   Hematocrit 35.6*   MCV 67.4*   Platelets 257         Recent Labs  Lab 05/31/16  1409 05/31/16  0634 05/31/16  0053  Sodium  --   --  129*   Potassium  --   --  4.8   Chloride  --   --  101   CO2  --   --  16*   Glucose  --   --  152*   BUN  --   --  38.6*   Creatinine  --   --  2.6*   Magnesium  --   --  2.2   AST (SGOT)  --   --  19   ALT  --   --  9   Alkaline Phosphatase  --   --  71   Bilirubin, Total  --   --  0.3   B-Natriuretic Peptide  --   --  504.8*   D-Dimer  --   --  6.24*   Troponin I 0.03 0.03 0.01           Rads:     Radiology Results (24 Hour)     Procedure Component Value Units Date/Time    NM Pulmonary Vent/Perf (VQ Scan) [[id number]] Collected:  05/31/16 1041    Order Status:  Completed Updated:  05/31/16 1047    Narrative:       CLINICAL HISTORY: Chest pain, shortness of breath, elevated d-dimer.    FINDINGS: A ventilation scan was performed with 63.6 mCi of technetium  48m DTPA aerosol and a perfusion scan was performed with 7 mCi of Tc 6m  MAA injected intravenously. Multiple matched ventilation/perfusion  images of the lungs were obtained in different projections. Correlation  is made with the chest radiograph(s) dated 05/31/2016.    Images demonstrate mild heterogeneous tracer distribution within the  lungs bilaterally on both perfusion and ventilation. No unmatched  segmental or subsegmental perfusion defects are identified.      Impression:        Low probability for pulmonary embolism.    Ruby Cola, MD   05/31/2016 10:43 AM      Chest 2 Views [[id number]] Collected:  05/31/16 0213    Order Status:  Completed Updated:  05/31/16 0218    Narrative:       TECHNIQUE:  PA and lateral  chest    INDICATION: Chest pain    COMPARISON: 05/17/2016    FINDINGS:     Possible mild left basilar atelectasis.    No consolidation, edema, pneumothorax or pleural effusions.    Enlarged cardiopericardial silhouette, unchanged.    Moderate degenerative changes of the visualized spine.       Impression:         Possible mild left basilar atelectasis.    Loyal Buba, MD   05/31/2016 2:14 AM            Signed by: Nino Parsley, MD  Date/Time: 06/01/16 1:13 AM

## 2016-06-01 NOTE — Plan of Care (Signed)
Problem: Hemodynamic Status: Cardiac  Goal: Stable vital signs and fluid balance   06/01/16 1613   Goal/Interventions addressed this shift   Stable vital signs and fluid balance Monitor/assess vital signs and telemetry per unit protocol;Weigh on admission and record weight daily;Assess signs and symptoms associated with cardiac rhythm changes;Monitor intake/output per unit protocol and/or LIP order;Monitor lab values;Monitor for leg swelling/edema and report to LIP if abnormal

## 2016-06-01 NOTE — Progress Notes (Signed)
Pt complaining of constipation. eICU called s/w nurse, request was made for stool softener. Awaiting orders.

## 2016-06-01 NOTE — Progress Notes (Signed)
[facility] Note     ICU Daily Progress Note        Date Time: 06/01/16 11:46 AM  Patient Name: Jacqueline Weber  Attending Physician: Nino Parsley, MD  Room: IC08/IC08-A   Admit Date: 05/31/2016  LOS: 1 day        Assessment/Plan:     #Neuro:History of CVA, no focal neurologic deficits, repeat Head CT to make sure no bleed since pt  Very hypertensive.     #Cardio: chronic diastolic congested heart failure, EF 70%, concentric LVH,  hyperlipidemia, NSTEMI, not able to have cardiac cath given unstable elevated creatinine. Appreciate [place] [facility] recs. Cont coreg 25 mg bid, clonidine 0.1 mg bid, hydralazine 100mg  pobid, HCTZ 25mg  poqday, Cozaar 100mg  poqday    #Hem/Onc: Anemia, most likely from underlying kidney disease    #Renal /Fluid, Electrolytes : Acute on chronic kidney disease, renovascular disease appreciate Dr. Earl Gala recs. Increase bp medications.    #Endo:  Diabetes mellitus, cont SSI    #Nutrition: Tolerating by mouth diet    #Prophylaxis:   VTE Prophylaxis: +    Discussed with patient's daughter and patient at the bedside, and answered their questions.       Subjective:      65 year old Asian female , history of hyperlipidemia, hypertension, diabetes, previous CVA, CK D, myocardial infarction , presents with chest pain and was noted to have hypertensive urgency.        Patient still has some generalized weakness and dizziness but no chest pain.  Patient's daughter was concerned for transient facial droop and numbness of legs.     Medications:   Scheduled Meds:  Current Facility-Administered Medications   Medication Dose Route Frequency   . aspirin EC  325 mg Oral Daily   . carvedilol  25 mg Oral BID Meals   . cloNIDine  0.1 mg Oral BID   . hydrALAZINE  100 mg Oral BID   . losartan  100 mg Oral Daily   . simvastatin  10 mg Oral QHS   . SITagliptin  25 mg Oral Daily         Continuous Infusions:  . sodium chloride 30 mL/hr at 05/31/16 1710        Physical Exam:      Vitals:    06/01/16 1000   BP:    Pulse:    Resp:    Temp: 98 F (36.7 C)   SpO2:          Intake/Output Summary (Last 24 hours) at 06/01/16 1146  Last data filed at 06/01/16 1000   Gross per 24 hour   Intake             1598 ml   Output             1600 ml   Net               -2 ml     General Appearance:   NAD, comfortable, alert, interactive  Neuro: 5/5 strength throughout all extremities, equal on both sides. CN 2-12 intact, no evidence of facial droop  CV: ns1, ns2, no m/r/g  Pulm: CTA bil  Abd: NT, ND +BS  Extremities: no lower extremity edema      Labs:     Labs (last 72 hours):  Recent Labs      05/31/16   0053   WBC  6.27   Hgb  11.8*   Hematocrit  35.6*  No results for input(s): PT, INR, PTT in the last 72 hours. Recent Labs      06/01/16   0351  05/31/16   0053   Sodium  136  129*   Potassium  5.1  4.8   Chloride  111  101   CO2  16*  16*   BUN  32.0*  38.6*   Creatinine  2.4*  2.6*   Glucose  115*  152*   Calcium  8.1*  9.3   Magnesium  2.2  2.2                 Rads:     Radiological Imaging personally reviewed,and agree with radiology report including:       Head CT    06/01/2016   pending      VQ scan    05/31/2016    "Low probability for pulmonary embolism."    Transesophageal echocardiogram  05/31/2016    "1. No evidence of an aortic dissection  2. Dynamic left ventricular function with an EF of 70%   3. Adequately functioning cardiac valves"        I have personally reviewed the patient's history and 24 hour interval events, along with vitals, labs, radiology images and nursing.         Signed by: Barbaraann Rondo, MD  Date/Time: 06/01/16 11:46 AM    *This note was generated by the Epic EMR system/ Dragon speech recognition and may contain inherent errors or omissions not intended by the user. Grammatical errors, random word insertions, deletions, pronoun errors and incomplete sentences are occasional consequences of this technology due to software limitations. Not all errors are caught or  corrected. If there are questions or concerns about the content of this note or information contained within the body of this dictation they should be addressed directly with the author for clarification

## 2016-06-02 LAB — BASIC METABOLIC PANEL
Anion Gap: 8 (ref 5.0–15.0)
BUN: 27.3 mg/dL — ABNORMAL HIGH (ref 7.0–19.0)
CO2: 17 mEq/L — ABNORMAL LOW (ref 22–29)
Calcium: 8.3 mg/dL — ABNORMAL LOW (ref 8.5–10.5)
Chloride: 110 mEq/L (ref 100–111)
Creatinine: 2.1 mg/dL — ABNORMAL HIGH (ref 0.6–1.0)
Glucose: 116 mg/dL — ABNORMAL HIGH (ref 70–100)
Potassium: 4.1 mEq/L (ref 3.5–5.1)
Sodium: 135 mEq/L — ABNORMAL LOW (ref 136–145)

## 2016-06-02 LAB — CBC
Absolute NRBC: 0 10*3/uL
Hematocrit: 30 % — ABNORMAL LOW (ref 37.0–47.0)
Hgb: 9.5 g/dL — ABNORMAL LOW (ref 12.0–16.0)
MCH: 22 pg — ABNORMAL LOW (ref 28.0–32.0)
MCHC: 31.7 g/dL — ABNORMAL LOW (ref 32.0–36.0)
MCV: 69.6 fL — ABNORMAL LOW (ref 80.0–100.0)
MPV: 10.2 fL (ref 9.4–12.3)
Nucleated RBC: 0 /100 WBC (ref 0.0–1.0)
Platelets: 219 10*3/uL (ref 140–400)
RBC: 4.31 10*6/uL (ref 4.20–5.40)
RDW: 18 % — ABNORMAL HIGH (ref 12–15)
WBC: 5.61 10*3/uL (ref 3.50–10.80)

## 2016-06-02 LAB — GLUCOSE WHOLE BLOOD - POCT
Whole Blood Glucose POCT: 125 mg/dL — ABNORMAL HIGH (ref 70–100)
Whole Blood Glucose POCT: 152 mg/dL — ABNORMAL HIGH (ref 70–100)
Whole Blood Glucose POCT: 122 mg/dL — ABNORMAL HIGH (ref 70–100)
Whole Blood Glucose POCT: 169 mg/dL — ABNORMAL HIGH (ref 70–100)

## 2016-06-02 LAB — GFR: EGFR: 23.7

## 2016-06-02 MED ORDER — NITROGLYCERIN 2 % TD OINT
0.5000 [in_us] | TOPICAL_OINTMENT | TRANSDERMAL | Status: DC
Start: 2016-06-02 — End: 2016-06-08
  Administered 2016-06-02 – 2016-06-08 (×17): 0.5 [in_us] via TOPICAL
  Filled 2016-06-02 (×2): qty 1
  Filled 2016-06-02: qty 2
  Filled 2016-06-02 (×12): qty 1

## 2016-06-02 MED ORDER — POLYETHYLENE GLYCOL 3350 17 G PO PACK
17.0000 g | PACK | Freq: Once | ORAL | Status: AC
Start: 2016-06-02 — End: 2016-06-02
  Administered 2016-06-02: 17 g via ORAL

## 2016-06-02 MED ORDER — CLONIDINE HCL 0.1 MG PO TABS
0.1000 mg | ORAL_TABLET | Freq: Every evening | ORAL | Status: DC
Start: 2016-06-02 — End: 2016-06-05
  Administered 2016-06-02 – 2016-06-04 (×3): 0.1 mg via ORAL
  Filled 2016-06-02 (×3): qty 1

## 2016-06-02 MED ORDER — CLONIDINE HCL 0.1 MG PO TABS
0.1000 mg | ORAL_TABLET | Freq: Once | ORAL | Status: AC
Start: 2016-06-02 — End: 2016-06-02
  Administered 2016-06-02: 0.1 mg via ORAL
  Filled 2016-06-02: qty 1

## 2016-06-02 MED ORDER — NIFEDIPINE ER OSMOTIC RELEASE 30 MG PO TB24
30.0000 mg | ORAL_TABLET | Freq: Every day | ORAL | Status: DC
Start: 2016-06-02 — End: 2016-06-04
  Administered 2016-06-02 – 2016-06-04 (×3): 30 mg via ORAL
  Filled 2016-06-02 (×4): qty 1

## 2016-06-02 NOTE — Progress Notes (Signed)
[facility].  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 aspirin EC  325 mg Oral Daily   . carvedilol  25 mg Oral BID Meals   . cloNIDine  0.1 mg Oral QHS   . docusate sodium  200 mg Oral Daily   . heparin (porcine)  5,000 Units Subcutaneous Q12H [facility]   . hydrALAZINE  100 mg Oral Q8H [place]   . hydroCHLOROthiazide  25 mg Oral Daily   . insulin lispro  1-3 Units Subcutaneous QHS   . insulin lispro  1-5 Units Subcutaneous TID AC   . losartan  100 mg Oral Daily   . NIFEdipine ER  30 mg Oral Daily   . polyethylene glycol  17 g Oral Once   . simvastatin  10 mg Oral QHS     Continuous Infusions:  . nitroglycerin 80 mcg/min (06/02/16 0656)     PRN Meds:Nursing communication: Adult Hypoglycemia Treatment Algorithm **AND** dextrose **AND** dextrose **AND** glucagon (rDNA) **AND** Nursing communication: Document Significant Event Note, hydrALAZINE, labetalol, morphine    Objective:  Vital signs in last 24 hours:  Temp:  [97.1 F (36.2 C)-98.1 F (36.7 C)] 97.9 F (36.6 C)  Heart Rate:  [66-82] 71  Resp Rate:  [13-22] 14  BP: (142-221)/(58-76) 158/64    Intake/Output from yesterday (07:01 - 07:00):  09/28 0701 - 09/29 0700  In: 53 [P.O.:960; I.V.:339]  Out: 1000 [Urine:1000]     Physical Exam:   Gen: WD WN NAD   CV: S1 S2 N RRR   Chest: CTAB   Ab: ND NT soft no HSM +BS   Ext: No C/E    Labs:    Recent Labs  Lab 06/02/16  0421 06/01/16  0351 05/31/16  0053   Glucose 116* 115* 152*   BUN 27.3* 32.0* 38.6*    Creatinine 2.1* 2.4* 2.6*   Calcium 8.3* 8.1* 9.3   Sodium 135* 136 129*   Potassium 4.1 5.1 4.8   Chloride 110 111 101   CO2 17* 16* 16*   Albumin  --  2.1* 2.7*   Magnesium  --  2.2 2.2       Recent Labs  Lab 06/02/16  0421 05/31/16  0053   WBC 5.61 6.27   Hgb 9.5* 11.8*   Hematocrit 30.0* 35.6*   MCV 69.6* 67.4*   MCH 22.0* 22.3*   MCHC 31.7* 33.1   RDW 18* 17*   MPV 10.2 10.0   Platelets 219 257

## 2016-06-02 NOTE — OT Eval Note (Signed)
Jacqueline Weber  Jacqueline Weber  [phone]    Occupational Therapy Evaluation    Patient: Jacqueline Weber    MRN#: [id number]     IC08/IC08-A    Time of treatment: Time Calculation  OT Received On: 06/02/16  Start Time: 1975  Stop Time: 1218  Time Calculation (min): 25 min  OT Visit Number: 1    Consult received for Jacqueline Weber for OT Evaluation and Treatment.  Patient's medical condition is appropriate for Occupational therapy intervention at this time.    Assessment:   .Jacqueline Weber is a 65 y.o. female admitted 05/31/2016.   Brief chart review completed including review of labs, review of imaging, review of vitals, review of H&P and physician progress notes, talking with Patient's daughter at bedside regarding Patient's PLOF and review of consulting physician notes .  Pt's ability to complete ADLs and functional transfers is impaired due to the following deficits:  decreased activity tolerance, decreased balance, decreased bed mobility, decreased safety awareness, decreased strength and transfers .  Pt demonstrates performance deficits with grooming, bathing, dressing, toileting and functional mobility. There are a few comorbidities or other factors that affect plan of care and require modification of task including: assistive device needed for mobility,recent hospitalization and has stairs to manage.  Pt would continue to benefit from OT to address these deficits and increase functional independence.    Assessment: decreased strength;balance deficits;decreased independence with ADLs;decreased safety awareness;decreased independence with IADLs;decreased endurance/activity tolerance     Complexity Chart Review Performance Deficits Clinical Decision Making Hx/Comorbidities Assistance needed   Low Brief 1-3 Limited options None None (or at baseline)   Moderate Expanded 3-5 Several Options 1-2 Min/Mod assist (not at baseline)    High Extensive 5 or more Multiple options 3 or more Max/dependent (not at baseline     Therapy Diagnosis: generalized weakness, decreased functional mobility  and decreased independence with ADL's due to HTN urgency. Without therapy interventions, patient is at risk for falls and decreased independence.    Rehabilitation Potential: Prognosis: Good;With continued OT s/p acute discharge      Plan:   OT Frequency Recommended: 3-4x/wk   Treatment Interventions: ADL retraining;Functional transfer training;UE strengthening/ROM;Endurance training;Patient/Family training;Neuro muscular reeducation     Patient Goal  Patient Goal: to go home    Risks/Benefits/POC Discussed with Pt/Family: With patient/family    Goals:   Goal Formulation: Patient;Family  Time For Goal Achievement: by time of discharge  ADL Goals  Patient will groom self: Supervision;at sinkside;5 visits  Patient will dress lower body: Supervision;5 visits  Patient will toilet: [place] visits  Mobility and Transfer Goals  Pt will transfer bed to toilet: Supervision;with rolling walker;5 visits                         Discharge Recommendations:   Based on today's session patient's discharge recommendation is the following: Discharge Recommendation: Home with 24/7 supervision;Home with home health OT.       DME Recommended for Discharge: Shower chair ([place] shower hose; grab bars around toilet)        Precautions and Contraindications: Falls Risk          Medical Diagnosis: Shortness of breath [R06.02]  Hyponatremia [E87.1]  SOB (shortness of breath) [R06.02]  Hyperglycemia [R73.9]  Renal insufficiency [N28.9]  Hypertensive urgency [I16.0]  Elevated brain natriuretic peptide (BNP) level [R79.89]  Hypertensive emergency [I16.1]  Elevated d-dimer [R79.1]  Other secondary hypertension [I15.8]  Chest pain, unspecified type [R07.9]    History of Present Illness: Jacqueline Weber is a 65 y.o. female admitted on 05/31/2016 with "past medical history of hypertension,  hyperlipidemia, diabetes, CVA and CKD who presents with shortness of breath and chest pain.  The patient was released from our service on September 15 following an ICU admission for hypertensive urgency and myocardial infarction.  The patient had been seen at [facility] prior to this admission, May 10, 2016 complaining of chest pain.  She had a nuclear stress test done earlier in the year, showing inferolateral ischemia.  The patient was scheduled for left heart cath on May 18, 2016 however, preprocedure blood work revealed a creatinine of 1.9 so the cath was postponed pending Nephrology evaluation.  She presented to the emergency room on May 16, 2016.  She was profoundly hypertensive and had ST depression in lateral leads.  Her initial troponin was normal, but bumped to over 8.  The patient was taken to the ICU for blood pressure control and diuresis.  The patient was found to be anemic during this admission, likely due to CKD.  Both nephrology and hematology were consulted.  The patient was discharged home in medical managements and instructions to follow-up at [facility], which she did on Monday.  Medication adjustments were recommended but the patient has yet to initiate those adjustments.  She is presented back to [place] with chest pain and shortness of breath.      Her daughter is at the bedside and is acting as a Optometrist for her mother.  The patient denies headache, dizziness, visual changes, gait abnormality, abdominal pain, nausea, vomiting, diarrhea, constipation, or urinary symptoms.  She tells me that she has been taking her medications as prescribed."--as per H & P note.          Past Medical/Surgical History:  Past Medical History:   Diagnosis Date   . Cataracts, bilateral    . Cerebrovascular accident    . Chronic kidney disease    . CVA (cerebral infarction) 03/2013   . Diabetes mellitus    . Hypercholesteremia    . Hypertension    . Myocardial infarction       History  reviewed. No pertinent surgical history.      X-Rays/Tests/Labs:  Impression:    No acute intracranial abnormality is seen.           Marcos Eke, MD   06/01/2016 2:20 PM      Social History:  Prior Level of Function  Prior level of function: Independent with ADLs, Ambulates with assistive device  Assistive Device: Front wheel walker  Driving: does not drive  Cooking: No  DME Currently at Home: Other (Comment), Front wheel walker (nebulizer)  Home Living Arrangements  Living Arrangements: Children (lives with daughter)  Type of Home: House  Home Layout: Multi-level, Bed/bath upstairs (Townhouse)  Bathroom Shower/Tub: Engineer, materials: Grab bars in shower  DME Currently at Home: Other (Comment), Front wheel walker (nebulizer)      Subjective:   Patient is agreeable to participation in the therapy session. Family and/or guardian are agreeable to patient's participation in the therapy session. Nursing clears patient for therapy.  Subjective:  (Patient denies pain currently; Patient's daughter present for duration of session for education and used as an interpreter at times as per Patient's preference.)  Pain Assessment  Pain Assessment: No/denies pain.        Objective:  Observation of Patient/Vital Signs:  Patient is in bed with telemetry and peripheral IV in place.         Cognition/Neuro Status  Arousal/Alertness: Appropriate responses to stimuli  Attention Span: Appears intact  Orientation Level: Oriented X4  Memory: Appears intact  Following Commands: independent  Safety Awareness: minimal verbal instruction  Behavior: attentive;calm;cooperative  Hand Dominance: right handed    Gross ROM  Right Upper Extremity ROM: within functional limits  Left Upper Extremity ROM: within functional limits  Gross Strength via clinical observation  Right Upper Extremity Strength: within functional limits  Left Upper Extremity Strength: within functional limits          Sensory  Auditory: intact  Tactile -  Light Touch: intact  Visual Acuity: wears glasses (for reading only)       Self-care and Home Management  Eating: Independent;in a chair;Setup  Grooming: Contact Guard Assist;standing at sink;steadying;verbal prompting;wash/dry hands  Toileting: Contact Guard Assist;steadying;verbal prompting;clothing management up;clothing management down;perineal hygiene  Functional Transfers: Contact Guard Assist;toilet transfer;verbal prompting;steadying    Mobility and Transfers  Supine to Sit: Minimal Assist;Increased Effort;Increased Time;HOB raised  Sit to Stand: Contact Guard Assist;bed elevated;with instruction for hand placement to increase safety (from bed)  Bed to toilet transfe: Contact Guard Assist (w/ UE support)  Toilet to Arm-chair transfer: CGA w/ UE support  -verbal/tactile instructions for proper hand placement and pacing with all functional transfers for increased safety.        Balance  Static Sitting Balance: good  Static Standing Balance: good  Dynamic Standing Balance: fair    Participation and Endurance  Participation Effort: good  Endurance: Tolerates 10 - 20 min exercise with multiple rests    AM-PACT "6 Clicks" Daily Activity Inpatient Short Form  Inpatient AM-PACT Performed?: yes  Put On/Take Off Lower Body Clothing: A little  Assist with Bathing: A little  Assist with Toileting: A little  Put On/Take Off Upper Body Clothing: A little  Assist with Grooming: A little  Assist with Eating: None  OT Daily Activity Raw Score: 19  CMS 0-100% Score: 42.80%    Treatment Activities: Recommendations made to have Supervision with shower transfers upon d/c to home with use of shower seat an d grab bars for energy conservation and fall prevention.  Patient c/o feeling dizzy upon sitting at EOB from supine position, however with seated rest break and instructions to initiate ankle pumps Patient reporting dizziness subsided. Patient stood at sinkside for approx. 2 minutes to wash hands including reaching to obtain  soap and paper towels with CGA for steadying balance. Patient educated in importance of OOB sitting for all meals to increase endurance/strength for activities and for pulmonary hygiene. Patient's systolic BP during the session in the 180's. RN made aware of same. Patient seated in arm-chair at end of session with all needs within reach. Patient instructed to ring for nursing for all needs and appeared receptive to all education provided.        Educated the patient to role of occupational therapy, plan of care, goals of therapy and safety with mobility and ADLs, home safety.    Hewitt Blade. Rigoberto Noel, MS,OTR/L  Pager # [phone]  [phone]

## 2016-06-02 NOTE — Progress Notes (Signed)
[place]    Date Time: 06/02/16 9:33 AM  Patient Name: Jacqueline Weber       Patient Active Problem List   Diagnosis   . Type 2 diabetes mellitus without complication   . HTN (hypertension) urgency   . Hyperlipidemia   . History of CVA (cerebrovascular accident)   . Chest pain, unspecified type   . SOB (shortness of breath)   . Nausea   . Elevated d-dimer   . Hypertensive emergency   . Elevated brain natriuretic peptide (BNP) level   . Hyponatremia   . Renal insufficiency   . Chest pain       Assessment:      Recurrent hypertensive crisis with chest pain   Widened mediastinum in CXR - TEE neg for dissection   Worsened renal insufficiency, Scr up to 2.6   Type II MI in setting of hypertensive crisis last week, TnI went up to 8.03 - cath deferred due to renal insufficiency   Abnormal OP nuclear stress test findings, February 2017, nuclear stress test, small-sized, mild intensity inferoapical ischemia, EF 72%.   February 2017, echo, EF 70%, concentric LVH, grade I diastolic dysfunction,    mild AI.      Recommendations:    Will await am meds but consider Procardia if still not controlled.   Cr trending down 2.6-2.4-2.1, cont to monitor    BP >200 sys yesterday, today 140-150s, better   For BP on coreg 25 bid, clonidine 0.4 qD, hydralazine 100 TID, hctx 25, losartan 100, procardia 30   Eventual cath once renal fx stabalized, likley Monday per Dr. Kate Sable nephrology, will likely benefit from renal angio at the same time.   Monitor BP over the weekend, if needed can increase Procardia from 30 to 60.    Theda Sers, MD      Medications:      Scheduled Meds: PRN Meds:        aspirin EC 325 mg Oral Daily   carvedilol 25 mg Oral BID Meals   cloNIDine 0.1 mg Oral QHS   docusate sodium 200 mg Oral Daily   heparin (porcine) 5,000 Units Subcutaneous Q12H SCH   hydrALAZINE 100 mg Oral Q8H SCH   hydroCHLOROthiazide 25 mg Oral Daily   insulin lispro 1-3 Units  Subcutaneous QHS   insulin lispro 1-5 Units Subcutaneous TID AC   losartan 100 mg Oral Daily   NIFEdipine ER 30 mg Oral Daily   polyethylene glycol 17 g Oral Once   simvastatin 10 mg Oral QHS       Continuous Infusions:  . nitroglycerin 80 mcg/min (06/02/16 0656)      dextrose 15 g of glucose PRN   And     dextrose 125 mL PRN   And     glucagon (rDNA) 1 mg PRN   hydrALAZINE 10 mg Q6H PRN   labetalol 10 mg Q6H PRN   morphine 2 mg Q4H PRN             Subjective:   Denies chest pain, SOB or palpitations.      Physical Exam:     Vitals:    06/02/16 0600   BP:    Pulse:    Resp:    Temp: 97.9 F (36.6 C)   SpO2:      Temp (24hrs), Avg:97.8 F (36.6 C), Min:97.1 F (36.2 C), Max:98.1 F (36.7 C)      Telemetry reviewed no changes SR  Intake and Output Summary (Last 24 hours) at Date Time    Intake/Output Summary (Last 24 hours) at 06/02/16 0933  Last data filed at 06/02/16 0917   Gross per 24 hour   Intake             1299 ml   Output             1350 ml   Net              -51 ml       General Appearance:  Breathing comfortable, no acute distress  Head:  normocephalic  Eyes:  EOM's intact  Neck:  No carotid bruit or jugular venous distension  Lungs:  Clear to auscultation throughout, no wheezes, rhonchi or rales, good respiratory effort   Chest Wall:  No tenderness or deformity  Heart:  S1, S2 normal, no S3, no S4, no murmur  Abdomen:  Soft, non-tender, positive bowel sounds  Extremities:  No cyanosis, clubbing or edema  Pulses:  Equal radial pulses, 4/4 symmetric  Neurologic:  Alert and oriented x3, mood and affect normal  Musculoskeletal: normal strength and tone    Labs:       Recent Labs  Lab 05/31/16  1409 05/31/16  0634 05/31/16  0053   Troponin I 0.03 0.03 0.01               Recent Labs  Lab 06/02/16  0421 06/01/16  0351   Bilirubin, Total  --  0.2   Protein, Total 4.6* 5.0*   Albumin  --  2.1*   ALT  --  <6   AST (SGOT)  --  23       Recent Labs  Lab 06/01/16  0351   Magnesium 2.2           Recent Labs  Lab  06/02/16  0421 05/31/16  0053   WBC 5.61 6.27   Hgb 9.5* 11.8*   Hematocrit 30.0* 35.6*   Platelets 219 257       Recent Labs  Lab 06/02/16  0421 06/01/16  0351 05/31/16  0053   Sodium 135* 136 129*   Potassium 4.1 5.1 4.8   Chloride 110 111 101   CO2 17* 16* 16*   BUN 27.3* 32.0* 38.6*   Creatinine 2.1* 2.4* 2.6*   EGFR 23.7 20.3 18.5   Glucose 116* 115* 152*   Calcium 8.3* 8.1* 9.3           Invalid input(s): FREET4    .  Lab Results   Component Value Date    BNP 504.8 (H) 05/31/2016      Estimated Creatinine Clearance: 23.2 mL/min (based on SCr of 2.1 mg/dL).    Weight Monitoring 05/18/2016 05/19/2016 05/22/2016 05/25/2016 05/30/2016 05/31/2016 06/02/2016   Height - - 154.9 cm 152.4 cm 152.4 cm - -   Height Method - - - - Stated - -   Weight 66.452 kg 66.543 kg 64.683 kg 65.318 kg 64.864 kg 64.4 kg 66.1 kg   Weight Method Bed Scale Bed Scale - - Stated Bed Scale Bed Scale   BMI (calculated) - - 27 kg/m2 28.2 kg/m2 28 kg/m2 - -         Imaging:   Radiological Procedure reviewed.              Signed by: Theda Sers, MD        [facility]  NP [place] (8am-5pm)  MD [place] (8am-5pm)  After hours, non urgent consult line [phone]  After Hours, urgent consults [phone]

## 2016-06-02 NOTE — Progress Notes (Signed)
1438: text page sent to Dr. Kate Sable " ICU [address] [place]: BP continues to be 160-180's on 81mcg of nitro. was able to titrate down briefly. Did you want to do anything else with PO meds? Delainie Chavana"    [id number] Dr. Kate Sable called. RN reported BP's after procardia continue to be in the 170's and 180's RN was only briefly able to titrate nitro down to 17mcg. He ordered clonidine one time dose.   49 Dr. Bonnye Fava rounded on the patient. RN notified him of Dr. Earl Gala plan and the nitro gtt had been titrated down. RN inquired about goal 02 sat. 90% is ok. He will review the chart. Nail polish was removed per his direction. 02 sat improved.   1800 Nitro gtt was turned off.

## 2016-06-02 NOTE — Plan of Care (Signed)
Problem: Pain  Goal: Pain at adequate level as identified by patient  Outcome: Progressing   06/01/16 2354   Goal/Interventions addressed this shift   Pain at adequate level as identified by patient Assess for risk of opioid induced respiratory depression, including snoring/sleep apnea. Alert healthcare team of risk factors identified.;Assess pain on admission, during daily assessment and/or before any "as needed" intervention(s);Identify patient comfort function goal;Evaluate if patient comfort function goal is met;Evaluate patient's satisfaction with pain management progress;Reassess pain within 30-60 minutes of any procedure/intervention, per Pain Assessment, Intervention, Reassessment (AIR) Cycle;Offer non-pharmacological pain management interventions   Pain assessed. Pt given PRN pain medication as ordered. Pt re-assessed to ensure comfort and good response to pain medication.     Problem: Hemodynamic Status: Cardiac  Goal: Stable vital signs and fluid balance  Outcome: Not Progressing   06/01/16 2354   Goal/Interventions addressed this shift   Stable vital signs and fluid balance Monitor/assess vital signs and telemetry per unit protocol;Assess signs and symptoms associated with cardiac rhythm changes;Monitor lab values;Monitor intake/output per unit protocol and/or LIP order;Monitor for leg swelling/edema and report to LIP if abnormal   Pt's BP not responding to oral meds and PRN Hydralazine/Labetolol. Pt started on Nitro drip which is being titrated to keep SBP<140.

## 2016-06-02 NOTE — Progress Notes (Signed)
[place] Note     ICU Daily Progress Note        Date Time: 06/02/16 6:50 PM  Patient Name: Jacqueline Weber  Attending Physician: Nino Parsley, MD  Room: IC08/IC08-A   Admit Date: 05/31/2016  LOS: 2 days            Assessment:     Patient Active Problem List   Diagnosis   . Type 2 diabetes mellitus without complication   . HTN (hypertension) urgency   . Hyperlipidemia   . History of CVA (cerebrovascular accident)   . Chest pain, unspecified type   . SOB (shortness of breath)   . Nausea   . Elevated d-dimer   . Hypertensive emergency   . Elevated brain natriuretic peptide (BNP) level   . Hyponatremia   . Renal insufficiency   . Chest pain   Chronic kidney disease stage IV  Uncontrolled hypertension titrating IV nitroglycerin infusion  Procardia 30 mg daily added.  Nephrology    Plan:    Treatment as outlined  Aim to  Discontinue IV nitroglycerin      Subjective:   Comfortable, NAD    Medications:       Scheduled Meds: PRN Meds:      aspirin EC 325 mg Oral Daily   carvedilol 25 mg Oral BID Meals   cloNIDine 0.1 mg Oral QHS   docusate sodium 200 mg Oral Daily   heparin (porcine) 5,000 Units Subcutaneous Q12H [place]   hydrALAZINE 100 mg Oral Q8H [place]   hydroCHLOROthiazide 25 mg Oral Daily   insulin lispro 1-3 Units Subcutaneous QHS   insulin lispro 1-5 Units Subcutaneous TID AC   losartan 100 mg Oral Daily   NIFEdipine ER 30 mg Oral Daily   nitroglycerin 0.5 inch Topical Q6H HOLD [place]   simvastatin 10 mg Oral QHS       Continuous Infusions:      dextrose 15 g of glucose PRN   And     dextrose 125 mL PRN   And     glucagon (rDNA) 1 mg PRN   hydrALAZINE 10 mg Q6H PRN   labetalol 10 mg Q6H PRN   morphine 2 mg Q4H PRN             Physical Exam:     Vitals:    06/02/16 1715 06/02/16 1730 06/02/16 1745 06/02/16 1800   BP: 127/46 120/46 126/59 124/55   Pulse: 72 75 78 74   Resp: 14 15 16 16    Temp:    98.3 F (36.8 C)   TempSrc:    Temporal Artery   SpO2: 92% 94% 90% (!) 89%   Weight:        Height:         Temp (24hrs), Avg:98.6 F (37 C), Min:97.9 F (36.6 C), Max:99.5 F (37.5 C)           09/28 0701 - 09/29 0700  In: 1299 [P.O.:960; I.V.:339]  Out: 1000 [Urine:1000]       General Appearance: Alert  Mental status: Intact  Neuro:  Nonfocal  H & N: No JVD  Lungs: Clear  Cardiac: Regular  Abdomen:  Soft  Extremities: No edema  Skin: no rash      Data:       Labs:     Recent CBC   Recent Labs  Lab 06/02/16  0421 05/31/16  0053   WBC 5.61 6.27   RBC 4.31 5.28  Hgb 9.5* 11.8*   Hematocrit 30.0* 35.6*   MCV 69.6* 67.4*   Platelets 219 257         Recent Labs  Lab 06/02/16  0421 06/01/16  0351 05/31/16  1409 05/31/16  0634 05/31/16  0053   Sodium 135* 136  --   --  129*   Potassium 4.1 5.1  --   --  4.8   Chloride 110 111  --   --  101   CO2 17* 16*  --   --  16*   Glucose 116* 115*  --   --  152*   BUN 27.3* 32.0*  --   --  38.6*   Creatinine 2.1* 2.4*  --   --  2.6*   Magnesium  --  2.2  --   --  2.2   AST (SGOT)  --  23  --   --  19   ALT  --  <6  --   --  9   Alkaline Phosphatase  --  55  --   --  71   Bilirubin, Total  --  0.2  --   --  0.3   B-Natriuretic Peptide  --   --   --   --  504.8*   D-Dimer  --   --   --   --  6.24*   Troponin I  --   --  0.03 0.03 0.01           Rads:     Radiology Results (24 Hour)     ** No results found for the last 24 hours. **        Signed by: Nino Parsley, MD  Date/Time: 06/02/16 6:50 PM

## 2016-06-03 DIAGNOSIS — R079 Chest pain, unspecified: Secondary | ICD-10-CM

## 2016-06-03 LAB — BASIC METABOLIC PANEL
Anion Gap: 6 (ref 5.0–15.0)
BUN: 26.1 mg/dL — ABNORMAL HIGH (ref 7.0–19.0)
CO2: 20 mEq/L — ABNORMAL LOW (ref 22–29)
Calcium: 8.4 mg/dL — ABNORMAL LOW (ref 8.5–10.5)
Chloride: 109 mEq/L (ref 100–111)
Creatinine: 2.1 mg/dL — ABNORMAL HIGH (ref 0.6–1.0)
Glucose: 101 mg/dL — ABNORMAL HIGH (ref 70–100)
Potassium: 4.3 mEq/L (ref 3.5–5.1)
Sodium: 135 mEq/L — ABNORMAL LOW (ref 136–145)

## 2016-06-03 LAB — GFR: EGFR: 23.7

## 2016-06-03 LAB — CBC
Absolute NRBC: 0 10*3/uL
Hematocrit: 31.8 % — ABNORMAL LOW (ref 37.0–47.0)
Hgb: 10.2 g/dL — ABNORMAL LOW (ref 12.0–16.0)
MCH: 22.1 pg — ABNORMAL LOW (ref 28.0–32.0)
MCHC: 32.1 g/dL (ref 32.0–36.0)
MCV: 69 fL — ABNORMAL LOW (ref 80.0–100.0)
MPV: 10.2 fL (ref 9.4–12.3)
Nucleated RBC: 0 /100 WBC (ref 0.0–1.0)
Platelets: 226 10*3/uL (ref 140–400)
RBC: 4.61 10*6/uL (ref 4.20–5.40)
RDW: 18 % — ABNORMAL HIGH (ref 12–15)
WBC: 5.37 10*3/uL (ref 3.50–10.80)

## 2016-06-03 LAB — GLUCOSE WHOLE BLOOD - POCT
Whole Blood Glucose POCT: 115 mg/dL — ABNORMAL HIGH (ref 70–100)
Whole Blood Glucose POCT: 163 mg/dL — ABNORMAL HIGH (ref 70–100)
Whole Blood Glucose POCT: 122 mg/dL — ABNORMAL HIGH (ref 70–100)
Whole Blood Glucose POCT: 118 mg/dL — ABNORMAL HIGH (ref 70–100)

## 2016-06-03 MED ORDER — SENNOSIDES-DOCUSATE SODIUM 8.6-50 MG PO TABS
2.0000 | ORAL_TABLET | Freq: Every evening | ORAL | Status: DC
Start: 2016-06-03 — End: 2016-06-08
  Administered 2016-06-03 – 2016-06-04 (×2): 2 via ORAL
  Filled 2016-06-03 (×3): qty 2

## 2016-06-03 MED ORDER — BISACODYL 5 MG PO TBEC
10.0000 mg | DELAYED_RELEASE_TABLET | Freq: Every day | ORAL | Status: DC
Start: 2016-06-03 — End: 2016-06-08
  Administered 2016-06-03 – 2016-06-08 (×5): 10 mg via ORAL
  Filled 2016-06-03 (×5): qty 2

## 2016-06-03 NOTE — Plan of Care (Signed)
Problem: Hemodynamic Status: Cardiac  Goal: Stable vital signs and fluid balance  Outcome: Progressing   06/03/16 0443   Goal/Interventions addressed this shift   Stable vital signs and fluid balance Monitor/assess vital signs and telemetry per unit protocol;Weigh on admission and record weight daily;Monitor lab values       Comments: No PRN or IV BP medications needed overnight so far.   A&O x4, able to make needs known. Call light and belongings within reach.  Ambulates to toilet with standby assist. Tolerating diet.

## 2016-06-03 NOTE — Plan of Care (Signed)
Pt states almost 1 week since last BM. Only has colace scheduled. No prns. Paged MD.

## 2016-06-03 NOTE — Progress Notes (Addendum)
[place]    Date Time: 06/03/16 10:23 AM  Patient Name: [facility]       Patient Active Problem List   Diagnosis   . Type 2 diabetes mellitus without complication   . HTN (hypertension) urgency   . Hyperlipidemia   . History of CVA (cerebrovascular accident)   . Chest pain, unspecified type   . SOB (shortness of breath)   . Nausea   . Elevated d-dimer   . Hypertensive emergency   . Elevated brain natriuretic peptide (BNP) level   . Hyponatremia   . Renal insufficiency   . Chest pain       Assessment:      Recurrent hypertensive crisis with chest pain,BP improved   Widened mediastinum in CXR - TEE neg for dissection   Worsened renal insufficiency, Scr up to 2.6, slowly improvng   Type II MI in setting of hypertensive crisis last week, TnI went up to 8.03 - cath deferred due to renal insufficiency   Abnormal OPnuclear stress test findings, February 2017, nuclear stress test, small-sized, mild intensity inferoapical ischemia, EF 72%.   February 2017, echo, EF 70%, concentric LVH, grade I diastolic dysfunction,    mild AI.    Recommendations:    BP improved on current regimen   Cr trending down 2.6-2.4-2.1, cont to monitor, 2.1 today   Eventual cath once renal fx stabalized, likley Monday per Dr. Kate Sable nephrology, will likely benefit from renal angio at the same time.      Medications:      Scheduled Meds: PRN Meds:      aspirin EC 325 mg Oral Daily   carvedilol 25 mg Oral BID Meals   cloNIDine 0.1 mg Oral QHS   docusate sodium 200 mg Oral Daily   heparin (porcine) 5,000 Units Subcutaneous Q12H SCH   hydrALAZINE 100 mg Oral Q8H SCH   hydroCHLOROthiazide 25 mg Oral Daily   insulin lispro 1-3 Units Subcutaneous QHS   insulin lispro 1-5 Units Subcutaneous TID AC   losartan 100 mg Oral Daily   NIFEdipine ER 30 mg Oral Daily   nitroglycerin 0.5 inch Topical Q6H HOLD [place]   simvastatin 10 mg Oral QHS       Continuous Infusions:      dextrose 15 g of glucose PRN    And     dextrose 125 mL PRN   And     glucagon (rDNA) 1 mg PRN   hydrALAZINE 10 mg Q6H PRN   labetalol 10 mg Q6H PRN   morphine 2 mg Q4H PRN             Subjective:   Denies chest pain, SOB or palpitations.      Physical Exam:     Vitals:    06/03/16 1000   BP: 141/58   Pulse: 75   Resp: 16   Temp: 97.7 F (36.5 C)   SpO2: 97%     Temp (24hrs), Avg:98.5 F (36.9 C), Min:97.6 F (36.4 C), Max:99.8 F (37.7 C)      Telemetry reviewed, NSR     Intake and Output Summary (Last 24 hours) at Date Time    Intake/Output Summary (Last 24 hours) at 06/03/16 1023  Last data filed at 06/03/16 1000   Gross per 24 hour   Intake             1972 ml   Output  2500 ml   Net             -528 ml       General Appearance:  Breathing comfortable, no acute distress  Head:  normocephalic  Eyes:  EOM's intact  Neck:  No jugular venous distension, brisk carotid upstroke  Lungs:  Clear to auscultation throughout, no wheezes, rhonchi or rales, good respiratory effort   Chest Wall:  No tenderness or deformity  Heart:  S1, S2 normal, no S3, no S4, no murmur   Abdomen:  Soft, non-tender, positive bowel sounds, no hepatojugular reflux  Extremities:  No cyanosis, clubbing or edema  Pulses:  Equal radial pulses, 4/4 symmetric  Neurologic:  Alert and oriented x3, mood and affect normal  Musculoskeletal: normal strength and tone    Labs:     Recent Labs  Lab 05/31/16  1409 05/31/16  0634 05/31/16  0053   Troponin I 0.03 0.03 0.01               Recent Labs  Lab 06/02/16  0421 06/01/16  0351   Bilirubin, Total  --  0.2   Protein, Total 4.6* 5.0*   Albumin  --  2.1*   ALT  --  <6   AST (SGOT)  --  23       Recent Labs  Lab 06/01/16  0351   Magnesium 2.2           Recent Labs  Lab 06/03/16  0428 06/02/16  0421 05/31/16  0053   WBC 5.37 5.61 6.27   Hgb 10.2* 9.5* 11.8*   Hematocrit 31.8* 30.0* 35.6*   Platelets 226 219 257       Recent Labs  Lab 06/03/16  0428 06/02/16  0421 06/01/16  0351   Sodium 135* 135* 136   Potassium 4.3 4.1 5.1    Chloride 109 110 111   CO2 20* 17* 16*   BUN 26.1* 27.3* 32.0*   Creatinine 2.1* 2.1* 2.4*   EGFR 23.7 23.7 20.3   Glucose 101* 116* 115*   Calcium 8.4* 8.3* 8.1*           Invalid input(s): FREET4    .  Lab Results   Component Value Date    BNP 504.8 (H) 05/31/2016      Estimated Creatinine Clearance: 22.9 mL/min (based on SCr of 2.1 mg/dL).    Weight Monitoring 05/19/2016 05/22/2016 05/25/2016 05/30/2016 05/31/2016 06/02/2016 06/03/2016   Height - 154.9 cm 152.4 cm 152.4 cm - - -   Height Method - - - Stated - - -   Weight 66.543 kg 64.683 kg 65.318 kg 64.864 kg 64.4 kg 66.1 kg 64 kg   Weight Method Bed Scale - - Stated Bed Scale Bed Scale Bed Scale   BMI (calculated) - 27 kg/m2 28.2 kg/m2 28 kg/m2 - - -         Imaging:   Radiological Procedure reviewed.              Signed by: Mariana Kaufman, NP    Patient seen and examined, my assessment and plans as noted above.    Signed by    Aleen Campi, MD      [facility]  NP [place] (8am-5pm)  MD [place] [phone] (8am-5pm)  After hours, non urgent consult line [phone]  After Hours, urgent consults [phone]

## 2016-06-03 NOTE — Progress Notes (Signed)
[facility].  Progress Note    Assessment:  -DUK:GURK hypertensive crisis  -CKD stage 4 at baseline   -NAG metabolic acidosis  -Asymmetric kidney size,small right kidney likely renal ischemia with RAS  -Pulmonary edema  -Chest pain ,recurrent   -Acute NSTEMI  -Hypertensive emergency:now better  -Abnormal stress test as OP  -Diastolic dysfunction    Plan:  -BP better with a surge this am prior to meds  -On multiple meds with poor compliance issues  -She had tolerated Losartan well and OK to consider renal angiogram if having coronary angiogram but given the renal size discrepancy not sure if renal atrophy would improve  -She is on multiple meds with potential for rebound HTN,comsider clonidine patch than PRN if BP is still not controlled  -Mucomyst if plan for cath on Monday with some IVF on Monday am  D/W son and emphasized on compliance in future    Flossie Buffy, MD  Office - [phone]  ++++++++++++++++++++++++++++++++++++++++++++++++++++++++++++++  Subjective:  No new complaints    Medications:  Scheduled Meds:  Current Facility-Administered Medications   Medication Dose Route Frequency   . aspirin EC  325 mg Oral Daily   . carvedilol  25 mg Oral BID Meals   . cloNIDine  0.1 mg Oral QHS   . docusate sodium  200 mg Oral Daily   . heparin (porcine)  5,000 Units Subcutaneous Q12H [facility]   . hydrALAZINE  100 mg Oral Q8H [place]   . hydroCHLOROthiazide  25 mg Oral Daily   . insulin lispro  1-3 Units Subcutaneous QHS   . insulin lispro  1-5 Units Subcutaneous TID AC   . losartan  100 mg Oral Daily   . NIFEdipine ER  30 mg Oral Daily   . nitroglycerin  0.5 inch Topical Q6H HOLD MN   . simvastatin  10 mg Oral QHS     Continuous Infusions:   PRN Meds:Nursing communication: Adult Hypoglycemia Treatment Algorithm **AND** dextrose **AND** dextrose **AND** glucagon (rDNA) **AND** Nursing communication: Document Significant Event Note, hydrALAZINE, labetalol, morphine    Objective:  Vital signs  in last 24 hours:  Temp:  [97.5 F (36.4 C)-99.8 F (37.7 C)] 97.5 F (36.4 C)  Heart Rate:  [67-80] 79  Resp Rate:  [14-20] 16  BP: (120-190)/(46-88) 190/88  Intake/Output last 24 hours:    Intake/Output Summary (Last 24 hours) at 06/03/16 1155  Last data filed at 06/03/16 1000   Gross per 24 hour   Intake             1472 ml   Output             2500 ml   Net            -1028 ml     Intake/Output this shift:  I/O this shift:  In: 240 [P.O.:240]  Out: 800 [Urine:800]    Physical Exam:   Gen: WD WN NAD   CV: S1 S2 N RRR   Chest: CTAB   Ab: ND NT soft no HSM +BS   Ext: No C/E    Labs:    Recent Labs  Lab 06/03/16  0428 06/02/16  0421 06/01/16  0351 05/31/16  0053   Glucose 101* 116* 115* 152*   BUN 26.1* 27.3* 32.0* 38.6*   Creatinine 2.1* 2.1* 2.4* 2.6*   Calcium 8.4* 8.3* 8.1* 9.3   Sodium 135* 135* 136 129*   Potassium 4.3 4.1 5.1 4.8   Chloride 109  110 111 101   CO2 20* 17* 16* 16*   Albumin  --   --  2.1* 2.7*   Magnesium  --   --  2.2 2.2       Recent Labs  Lab 06/03/16  0428 06/02/16  0421 05/31/16  0053   WBC 5.37 5.61 6.27   Hgb 10.2* 9.5* 11.8*   Hematocrit 31.8* 30.0* 35.6*   MCV 69.0* 69.6* 67.4*   MCH 22.1* 22.0* 22.3*   MCHC 32.1 31.7* 33.1   RDW 18* 18* 17*   MPV 10.2 10.2 10.0   Platelets 226 219 257

## 2016-06-03 NOTE — Progress Notes (Signed)
06/03/16 1324   Vital Signs   Temp 97.7 F (36.5 C)   Temp Source Oral   Heart Rate 75   Resp Rate 16   BP 177/80   BP Location Left arm   Patient Position Lying       Pt arrived to PCU, stable. Family at bedside.

## 2016-06-03 NOTE — Progress Notes (Signed)
1013 - Report given to [place], RN, no questions at this time    1045 - pt transferred to Rm 205, via wheelchair by transport, tele box in place, chart and belongings sent, son with patient at time of transfer, VSS

## 2016-06-03 NOTE — Progress Notes (Signed)
[place] Note     ICU Daily Progress Note        Date Time: 06/03/16 9:43 AM  Patient Name: Jacqueline Weber  Attending Physician: Drinda Butts, MD  Room: IC08/IC08-A   Admit Date: 05/31/2016  LOS: 3 days            Assessment:     Patient Active Problem List   Diagnosis   . Type 2 diabetes mellitus without complication   . HTN (hypertension) urgency   . Hyperlipidemia   . History of CVA (cerebrovascular accident)   . Chest pain, unspecified type   . SOB (shortness of breath)   . Nausea   . Elevated d-dimer   . Hypertensive emergency   . Elevated brain natriuretic peptide (BNP) level   . Hyponatremia   . Renal insufficiency   . Chest pain     Off IV nitroglycerin  Blood pressure with fair control.  Chronic kidney disease.  Renal function improved.  Creatinine 2.1      Plan:   Transfer to telemetry.  Continue on just oral medications      Subjective:   Out of bed to chair, comfortable    Medications:       Scheduled Meds: PRN Meds:      aspirin EC 325 mg Oral Daily   carvedilol 25 mg Oral BID Meals   cloNIDine 0.1 mg Oral QHS   docusate sodium 200 mg Oral Daily   heparin (porcine) 5,000 Units Subcutaneous Q12H [place]   hydrALAZINE 100 mg Oral Q8H [place]   hydroCHLOROthiazide 25 mg Oral Daily   insulin lispro 1-3 Units Subcutaneous QHS   insulin lispro 1-5 Units Subcutaneous TID AC   losartan 100 mg Oral Daily   NIFEdipine ER 30 mg Oral Daily   nitroglycerin 0.5 inch Topical Q6H HOLD [place]   simvastatin 10 mg Oral QHS       Continuous Infusions:      dextrose 15 g of glucose PRN   And     dextrose 125 mL PRN   And     glucagon (rDNA) 1 mg PRN   hydrALAZINE 10 mg Q6H PRN   labetalol 10 mg Q6H PRN   morphine 2 mg Q4H PRN             Physical Exam:     Vitals:    06/03/16 0500 06/03/16 0600 06/03/16 0700 06/03/16 0800   BP: 149/50 137/56 144/62 151/62   Pulse: 79 73 74 74   Resp: 19 16 17 14    Temp:  97.6 F (36.4 C)     TempSrc:  Temporal Artery     SpO2: 98% 95% 95% 97%   Weight:       Height:          Temp (24hrs), Avg:98.8 F (37.1 C), Min:97.6 F (36.4 C), Max:99.8 F (37.7 C)           09/29 0701 - 09/30 0700  In: 2032 [P.O.:1760; I.V.:272]  Out: 2400 [Urine:2400]       General Appearance: Alert.  NAD   Mental status: Oriented   Neuro:  Nonfocal   H & N: No JVD   Lungs: Clear   Cardiac: Regular   Abdomen:  Soft   Extremities: No CCE   Skin: no rash      Data:       Vent Settings:           Labs:  Recent CBC   Recent Labs  Lab 06/03/16  0428 06/02/16  0421 05/31/16  0053   WBC 5.37 5.61 6.27   RBC 4.61 4.31 5.28   Hgb 10.2* 9.5* 11.8*   Hematocrit 31.8* 30.0* 35.6*   MCV 69.0* 69.6* 67.4*   Platelets 226 219 257         Recent Labs  Lab 06/03/16  0428 06/02/16  0421 06/01/16  0351 05/31/16  1409 05/31/16  0634 05/31/16  0053   Sodium 135* 135* 136  --   --  129*   Potassium 4.3 4.1 5.1  --   --  4.8   Chloride 109 110 111  --   --  101   CO2 20* 17* 16*  --   --  16*   Glucose 101* 116* 115*  --   --  152*   BUN 26.1* 27.3* 32.0*  --   --  38.6*   Creatinine 2.1* 2.1* 2.4*  --   --  2.6*   Magnesium  --   --  2.2  --   --  2.2   AST (SGOT)  --   --  23  --   --  19   ALT  --   --  <6  --   --  9   Alkaline Phosphatase  --   --  55  --   --  71   Bilirubin, Total  --   --  0.2  --   --  0.3   B-Natriuretic Peptide  --   --   --   --   --  504.8*   D-Dimer  --   --   --   --   --  6.24*   Troponin I  --   --   --  0.03 0.03 0.01           Rads:     Radiology Results (24 Hour)     ** No results found for the last 24 hours. **        Signed by: Nino Parsley, MD  Date/Time: 06/03/16 9:43 AM

## 2016-06-03 NOTE — Progress Notes (Signed)
[facility]  Progress Note  Patient Info:   Date/Time: 06/03/2016 / 6:55 PM   Admit Date:05/31/2016  Patient Name:Jacqueline Weber   GSU:[id number]   PCP: John Giovanni, MD  Attending Physician:Hamna Asa, Silva Bandy, MD     Subjective:   06/03/16     Denies chest pain, shortness of breath  Right eye sometimes feel hot and cold. Vision is impaired and at her baseline.  Constipated x 1 week. No abdominal pain    Chief Complaint:  Chest Pain  Update of Review of Systems:  Review of Systems   Constitutional: Negative for fever and malaise/fatigue.   HENT: Negative for hearing loss.    Eyes: Positive for blurred vision.        Right eye, chronic x 1 year for glaucoma   Respiratory: Negative for cough, sputum production and shortness of breath.    Cardiovascular: Negative for chest pain and leg swelling.   Gastrointestinal: Positive for constipation.   Genitourinary: Negative for dysuria.   Neurological: Negative for dizziness.     Assessment and Plan:   This is a very pleasant 65 year old lady who speaks [place] with past medical history of cerebrovascular accident, diabetes mellitus, hypercholesterolemia, hypertension, glaucoma, chronic kidney disease, recent hospitalization to intensive care on May 10, 2016 for chest pain and nuclear stress test done earlier this year showing inferolateral ischemia.  Patient was scheduled for left heart cath on September 2014.  However, blood work revealed creatinine of 1.9 and cardiac catheterization was postponed pending nephrology evaluation.  Patient presented to the emergency department on May 16, 2016 with severely elevated blood pressure and ST depression in the lateral leads with peak troponin 28.  Patient was transferred to ICU for blood pressure control and patient was discharged home with medical management. Now admitted to [facility] on May 31, 2016 for shortness of breath and chest pain.  Patient had elevated d-dimer widening of the  mediastinum on chest x-ray and initial concern for aortic dissection.  Patient creatinine on admission was 2.6.  CT angiogram scan is not possible to obtain due to renal function to rule out dissection.  Patient was then transferred to intensive care unit for blood pressure management.    1.  Hypertensive crisis.  Systolic blood pressure staying under 200 at this time  --- Continue Coreg 25 mg, clonidine 0.5 mg at bedtime, hydralazine 100 mg 3 times a day, hydrochlorothiazide 25 mg daily, Cozaar 100 mg daily, Procardia XL 30 mg daily, nitroglycerin patch    2.  Chest pain with recent non-STEMI in the setting of hypertensive crisis, last admission recently with peak troponin of 8.0, 30.  Cardiac catheterization was deferred at that time due to renal insufficiency.  ---Troponins x 3 set normal this admission  --- Trans-esophageal echocardiogram in ICU, rule out dissection.  Mild non-mobile blood.  Aortic arch visualized and very limited.  No clear pathology noted.  No left atrial clot.  No shunt.  Hyperdynamic LV with ejection fraction of 70 percent and left ventricular hypertrophy present.  Mild aortic and mitral insufficiency.  --- Now renal function improved.  Creatinine from 2.6-2.1.  Appear to be back to her baseline since last hospital discharge about a week ago.  ----Renal and cardiology working for cardiac catheterization and renal artery angiogram on Monday.  If renal function remains stable.    3.  Shortness of breath with elevated d-dimer, much improved due to hypertensive crisis  --- VQ scan low probability on May 31, 2016    4.  Acute renal injury on chronic kidney disease, improving from 2.6-2.1  --Renal ultrasound on May 16, 2016 showed no hydronephrosis.  Asymmetric kidney size, smaller on the right side  --- Renal artery ultrasound duplex on May 18, 2016.  Last admission showed no evidence of significant renal artery stenosis.  Limited visualization of the mid left renal artery.   Elevated resistive indices bilaterally.  This is often indicated for medical renal disease.    5.  Constipation for 1 week.  --- Start MiraLAX, Dulcolax    6.  History of glaucoma.  Patient is due to follow-up with primary ophthalmologist Dr. Lance Sell, but patient is in the hospital.  At present, and need to reschedule  --- No pain or vision changes, new from a baseline  --- Patient is not prescribed any eyedrops at home    7. Left kidney cysts.  Follow-up with Nephrology    Disposition monitor renal function.  Optimize blood pressure control.  Possible cardiac catheterization and renal artery angiogram Monday.  Finding and plan discussed with patient and son's at bedside      DVT Prohylaxis: Heparin  Central Line/Foley Catheter/PICC line status: None  Code Status: Full Code  Disposition: Home  Type of Admission:Inpatient  Anticipated Length of Stay: > 2 midnights  Medical Necessity for stay: Hypertensive crisis  Hospital Problems:   Active Problems:    Chest pain, unspecified type    Chest pain    Objective:     Vitals:    06/03/16 1512 06/03/16 1611 06/03/16 1725 06/03/16 1806   BP: 164/74  160/69    Pulse: 79 75 87 81   Resp:    12   Temp:    97.9 F (36.6 C)   TempSrc:    Temporal Artery   SpO2:    95%   Weight:       Height:         Physical Exam:   Physical Exam   Constitutional: She is oriented to person, place, and time. No distress.   Eyes: Pupils are equal, round, and reactive to light. No scleral icterus.   No red eye   Cardiovascular: Normal rate and regular rhythm.    No murmur heard.  Pulmonary/Chest: Effort normal and breath sounds normal. No respiratory distress. She has no wheezes. She has no rales.   Abdominal: Soft. Bowel sounds are normal. She exhibits no distension. There is no tenderness. There is no rebound.   Musculoskeletal: She exhibits no edema.   Neurological: She is alert and oriented to person, place, and time. No cranial nerve deficit. Coordination normal. GCS  score is 15.   Skin: Skin is warm. No rash noted. No erythema.   Psychiatric: Mood, memory, affect and judgment normal.     Results of Labs/imaging   Labs and radiology reports have been reviewed.    Hospitalist   Signed by:   [place]  06/03/2016 6:55 PM    *This note was generated by the Epic EMR system/ Dragon speech recognition and may contain inherent errors or omissions not intended by the user. Grammatical errors, random word insertions, deletions, pronoun errors and incomplete sentences are occasional consequences of this technology due to software limitations. Not all errors are caught or corrected. If there are questions or concerns about the content of this note or information contained within the body of this dictation they should be addressed directly with the author for clarification

## 2016-06-03 NOTE — Plan of Care (Signed)
Problem: Safety  Goal: Patient will be free from injury during hospitalization  Outcome: Progressing   05/31/16 2155   Goal/Interventions addressed this shift   Patient will be free from injury during hospitalization  Assess patient's risk for falls and implement fall prevention plan of care per policy;Provide and maintain safe environment;Use appropriate transfer methods;Ensure appropriate safety devices are available at the bedside;Include patient/ family/ care giver in decisions related to safety;Hourly rounding;Assess for patients risk for elopement and implement Elopement Risk Plan per policy       Safety discussed with patient, non-compliant with calling for assistance. Steady on feet. Son at bedside at all times throughout shift. Door kept open to increase visibility.     Problem: Moderate/High Fall Risk Score >5  Goal: Patient will remain free of falls  Outcome: Not Progressing   06/03/16 0800   OTHER   Moderate Risk (6-13) MOD-Place Fall Risk level on whiteboard in room;MOD-Include family in multidisciplinary POC discussions;MOD-Request PT/OT consult order for patients with gait/mobility impairment;MOD-Utilize diversion activities;MOD-Re-orient confused patients;MOD-Use of assistive devices-bedside commode if appropriate;MOD-Remain with patient during toileting

## 2016-06-04 DIAGNOSIS — I2510 Atherosclerotic heart disease of native coronary artery without angina pectoris: Secondary | ICD-10-CM

## 2016-06-04 DIAGNOSIS — Z951 Presence of aortocoronary bypass graft: Secondary | ICD-10-CM

## 2016-06-04 DIAGNOSIS — I219 Acute myocardial infarction, unspecified: Secondary | ICD-10-CM

## 2016-06-04 HISTORY — DX: Presence of aortocoronary bypass graft: Z95.1

## 2016-06-04 HISTORY — DX: Acute myocardial infarction, unspecified: I21.9

## 2016-06-04 HISTORY — DX: Atherosclerotic heart disease of native coronary artery without angina pectoris: I25.10

## 2016-06-04 LAB — CBC
Absolute NRBC: 0 10*3/uL
Hematocrit: 34.4 % — ABNORMAL LOW (ref 37.0–47.0)
Hgb: 11 g/dL — ABNORMAL LOW (ref 12.0–16.0)
MCH: 22.3 pg — ABNORMAL LOW (ref 28.0–32.0)
MCHC: 32 g/dL (ref 32.0–36.0)
MCV: 69.8 fL — ABNORMAL LOW (ref 80.0–100.0)
MPV: 10.5 fL (ref 9.4–12.3)
Nucleated RBC: 0 /100 WBC (ref 0.0–1.0)
Platelets: 258 10*3/uL (ref 140–400)
RBC: 4.93 10*6/uL (ref 4.20–5.40)
RDW: 18 % — ABNORMAL HIGH (ref 12–15)
WBC: 6.13 10*3/uL (ref 3.50–10.80)

## 2016-06-04 LAB — COMPREHENSIVE METABOLIC PANEL
ALT: 10 U/L (ref 0–55)
AST (SGOT): 14 U/L (ref 5–34)
Albumin/Globulin Ratio: 0.7 — ABNORMAL LOW (ref 0.9–2.2)
Albumin: 2.4 g/dL — ABNORMAL LOW (ref 3.5–5.0)
Alkaline Phosphatase: 58 U/L (ref 37–106)
Anion Gap: 10 (ref 5.0–15.0)
BUN: 25.8 mg/dL — ABNORMAL HIGH (ref 7.0–19.0)
Bilirubin, Total: 0.3 mg/dL (ref 0.2–1.2)
CO2: 18 mEq/L — ABNORMAL LOW (ref 22–29)
Calcium: 8.9 mg/dL (ref 8.5–10.5)
Chloride: 108 mEq/L (ref 100–111)
Creatinine: 2 mg/dL — ABNORMAL HIGH (ref 0.6–1.0)
Globulin: 3.3 g/dL (ref 2.0–3.6)
Glucose: 127 mg/dL — ABNORMAL HIGH (ref 70–100)
Potassium: 4.3 mEq/L (ref 3.5–5.1)
Protein, Total: 5.7 g/dL — ABNORMAL LOW (ref 6.0–8.3)
Sodium: 136 mEq/L (ref 136–145)

## 2016-06-04 LAB — MAGNESIUM: Magnesium: 1.9 mg/dL (ref 1.6–2.6)

## 2016-06-04 LAB — GLUCOSE WHOLE BLOOD - POCT
Whole Blood Glucose POCT: 125 mg/dL — ABNORMAL HIGH (ref 70–100)
Whole Blood Glucose POCT: 204 mg/dL — ABNORMAL HIGH (ref 70–100)
Whole Blood Glucose POCT: 144 mg/dL — ABNORMAL HIGH (ref 70–100)
Whole Blood Glucose POCT: 169 mg/dL — ABNORMAL HIGH (ref 70–100)

## 2016-06-04 LAB — GFR: EGFR: 25.1

## 2016-06-04 MED ORDER — SODIUM CHLORIDE 0.9 % IV SOLN
INTRAVENOUS | Status: DC
Start: 2016-06-04 — End: 2016-06-06

## 2016-06-04 MED ORDER — NIFEDIPINE ER OSMOTIC RELEASE 30 MG PO TB24
60.0000 mg | ORAL_TABLET | Freq: Every day | ORAL | Status: DC
Start: 2016-06-05 — End: 2016-06-08
  Administered 2016-06-05 – 2016-06-08 (×4): 60 mg via ORAL
  Filled 2016-06-04 (×4): qty 2

## 2016-06-04 MED ORDER — ACETYLCYSTEINE 20 % ORAL SOLUTION 3 ML SYRINGE
600.0000 mg | Freq: Two times a day (BID) | RESPIRATORY_TRACT | Status: AC
Start: 2016-06-04 — End: 2016-06-06
  Administered 2016-06-04 – 2016-06-05 (×3): 600 mg via ORAL
  Filled 2016-06-04 (×4): qty 3

## 2016-06-04 NOTE — Progress Notes (Signed)
[facility].  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   -NAG metabolic acidosis  -Asymmetric kidney size,small right kidney likely renal ischemia with RAS  -Pulmonary edema  -Chest pain ,recurrent   -Acute NSTEMI  -Hypertensive emergency:now better  -Abnormal stress test as OP  -Diastolic dysfunction      Plan:  -Increase Nifedipine to 30 mg every 8 hrs for optimal Bp cotnrol  -She had tolerated Losartan well and OK to consider renal angiogram if having coronary angiogram but given the renal size discrepancy not sure if renal atrophy would improve  -She is on multiple meds with potential for rebound HTN,consider clonidine patch than PRN if BP is still not controlled  -Mucomyst if plan for cath on Monday with some IVF on Monday am prior to cath as saline or bicarb based IVF with D5+150 Meq bicarb at 3 ml/kg one hour prior to and 3 hrs after coronary angiogram  -Mucomyst 1200 mg PO BID  D/W son with pt's permission and emphasized on compliance in future  Jacqueline Buffy, MD  Office - [phone]  ++++++++++++++++++++++++++++++++++++++++++++++++++++++++++++++  Subjective:  No new complaints  She feels well today  No CP    Medications:  Scheduled Meds:  Current Facility-Administered Medications   Medication Dose Route Frequency   . acetylcysteine  600 mg Oral BID   . aspirin EC  325 mg Oral Daily   . bisacodyl  10 mg Oral Daily   . carvedilol  25 mg Oral BID Meals   . cloNIDine  0.1 mg Oral QHS   . heparin (porcine)  5,000 Units Subcutaneous Q12H [facility]   . hydrALAZINE  100 mg Oral Q8H [place]   . hydroCHLOROthiazide  25 mg Oral Daily   . insulin lispro  1-3 Units Subcutaneous QHS   . insulin lispro  1-5 Units Subcutaneous TID AC   . losartan  100 mg Oral Daily   . NIFEdipine ER  30 mg Oral Daily   . nitroglycerin  0.5 inch Topical Q6H HOLD MN   . senna-docusate  2 tablet Oral QHS   . simvastatin  10 mg Oral QHS     Continuous Infusions:   PRN  Meds:Nursing communication: Adult Hypoglycemia Treatment Algorithm **AND** dextrose **AND** dextrose **AND** glucagon (rDNA) **AND** Nursing communication: Document Significant Event Note, hydrALAZINE, labetalol, morphine    Objective:  Vital signs in last 24 hours:  Temp:  [97.2 F (36.2 C)-98.6 F (37 C)] 97.8 F (36.6 C)  Heart Rate:  [72-83] 83  Resp Rate:  [16-20] 16  BP: (131-181)/(66-78) 169/72  Intake/Output last 24 hours:    Intake/Output Summary (Last 24 hours) at 06/04/16 1836  Last data filed at 06/04/16 1254   Gross per 24 hour   Intake             1010 ml   Output              851 ml   Net              159 ml     Intake/Output this shift:  I/O this shift:  In: 61 [P.O.:590]  Out: 92 [Urine:850; Stool:1]    Physical Exam:   Gen: WD WN NAD   CV: S1 S2 N RRR   Chest: CTAB   Ab: ND NT soft no HSM +BS   Ext: No C/E    Labs:    Recent Labs  Lab  06/04/16  0650 06/03/16  0428 06/02/16  0421 06/01/16  0351 05/31/16  0053   Glucose 127* 101* 116* 115* 152*   BUN 25.8* 26.1* 27.3* 32.0* 38.6*   Creatinine 2.0* 2.1* 2.1* 2.4* 2.6*   Calcium 8.9 8.4* 8.3* 8.1* 9.3   Sodium 136 135* 135* 136 129*   Potassium 4.3 4.3 4.1 5.1 4.8   Chloride 108 109 110 111 101   CO2 18* 20* 17* 16* 16*   Albumin 2.4*  --   --  2.1* 2.7*   Magnesium 1.9  --   --  2.2 2.2       Recent Labs  Lab 06/04/16  0650 06/03/16  0428 06/02/16  0421   WBC 6.13 5.37 5.61   Hgb 11.0* 10.2* 9.5*   Hematocrit 34.4* 31.8* 30.0*   MCV 69.8* 69.0* 69.6*   MCH 22.3* 22.1* 22.0*   MCHC 32.0 32.1 31.7*   RDW 18* 18* 18*   MPV 10.5 10.2 10.2   Platelets 258 226 219

## 2016-06-04 NOTE — Progress Notes (Signed)
[facility]  Progress Note  Patient Info:   Date/Time: 06/04/2016 / 10:19 AM   Admit Date:05/31/2016  Patient Name:Jacqueline Weber   LJQ:[id number]   PCP: John Giovanni, MD  Attending Physician:Volanda Mangine, Silva Bandy, MD     Subjective:   06/04/16     Comfortable, sitting in chair and eating lunch. Daughter at bedside. Denies chest pain, shortness of breath and headache. Right eye still blurry    Chief Complaint:  Chest Pain  Update of Review of Systems:  Review of Systems   Constitutional: Negative for fever and malaise/fatigue.   HENT: Negative for hearing loss.    Eyes: Positive for blurred vision.        Right eye, chronic x 1 year for glaucoma   Respiratory: Negative for cough, sputum production and shortness of breath.    Cardiovascular: Negative for chest pain and leg swelling.   Gastrointestinal: Positive for constipation.   Genitourinary: Negative for dysuria.   Neurological: Negative for dizziness.     Assessment and Plan:   This is a very pleasant 65 year old lady who speaks [place] with past medical history of cerebrovascular accident, diabetes mellitus, hypercholesterolemia, hypertension, glaucoma, chronic kidney disease, recent hospitalization to intensive care on May 10, 2016 for chest pain and nuclear stress test done earlier this year showing inferolateral ischemia.  Patient was scheduled for left heart cath on September 2014.  However, blood work revealed creatinine of 1.9 and cardiac catheterization was postponed pending nephrology evaluation.  Patient presented to the emergency department on May 16, 2016 with severely elevated blood pressure and ST depression in the lateral leads with peak troponin 28.  Patient was transferred to ICU for blood pressure control and patient was discharged home with medical management. Now admitted to [facility]  on May 31, 2016 for shortness of breath and chest pain.  Patient had elevated d-dimer widening of the mediastinum on  chest x-ray and initial concern for aortic dissection.  Patient creatinine on admission was 2.6.  CT angiogram scan is not possible to obtain due to renal function to rule out dissection.  Patient was then transferred to intensive care unit for blood pressure management.    1.  Hypertensive crisis.  Systolic blood pressure much improved.   --- Continue Coreg 25 mg, clonidine 0.5 mg at bedtime, hydralazine 100 mg 3 times a day, hydrochlorothiazide 25 mg daily, Cozaar 100 mg daily, Procardia XL 30 mg daily, nitroglycerin patch    2.  Chest pain with recent non-STEMI in the setting of hypertensive crisis, last admission recently with peak troponin of 8.0, 30.  Cardiac catheterization tomorrow.   ---Troponins x 3 set normal this admission  --- Trans-esophageal echocardiogram in ICU, rule out dissection.  Mild non-mobile blood.  Aortic arch visualized and very limited.  No clear pathology noted.  No left atrial clot.  No shunt.  Hyperdynamic LV with ejection fraction of 70 percent and left ventricular hypertrophy present.  Mild aortic and mitral insufficiency.  --- Now renal function improved.  Creatinine from 2.6-2.0.  Appear to be back to her baseline since last hospital discharge about a week ago.  ----Renal and cardiology working for cardiac catheterization and renal artery angiogram tomorrow. Start Mucomyst today and IV fluid.    3.  Shortness of breath with elevated d-dimer, much improved due to hypertensive crisis  --- VQ scan low probability on May 31, 2016    4.  Acute renal injury on chronic kidney disease, improving from 2.6-2.1  --  Renal ultrasound on May 16, 2016 showed no hydronephrosis.  Asymmetric kidney size, smaller on the right side  --- Renal artery ultrasound duplex on May 18, 2016.  Last admission showed no evidence of significant renal artery stenosis.  Limited visualization of the mid left renal artery.  Elevated resistive indices bilaterally.  This is often indicated for medical  renal disease.    5.  Constipation for 1 week.  --- Start MiraLAX, Dulcolax    6.  History of glaucoma.  Patient is due to follow-up with primary ophthalmologist Dr. Lance Sell, but patient is in the hospital.  At present, and need to reschedule  --- No pain or vision changes, new from a baseline  --- Patient is not prescribed any eyedrops at home  ----Discussed with colleague of Dr. Rosendo Gros, Dr. Prince Solian on the phone. No new recommendation at this time. To reschedule another appointment soon after discharged.     7. Left kidney cysts.  Follow-up with Nephrology    Disposition: NPO from midnight tonight for cardiac cath and renal artery angiogram tomorrow. Patient and daughter agrees.      DVT Prohylaxis: Heparin  Central Line/Foley Catheter/PICC line status: None  Code Status: Full Code  Disposition: Home  Type of Admission:Inpatient  Anticipated Length of Stay: > 2 midnights  Medical Necessity for stay: Hypertensive crisis  Hospital Problems:   Active Problems:    Chest pain, unspecified type    Chest pain    Objective:     Vitals:    06/04/16 0203 06/04/16 0714 06/04/16 0754 06/04/16 0906   BP: 173/76 177/75 174/67 131/66   Pulse:       Resp: 18 20 18 18    Temp: 97.2 F (36.2 C) 97.8 F (36.6 C)  97.3 F (36.3 C)   TempSrc: Temporal Artery Temporal Artery     SpO2: 93% 97%  96%   Weight:       Height:         Physical Exam:   Physical Exam   Constitutional: She is oriented to person, place, and time. No distress.   Eyes: Pupils are equal, round, and reactive to light. No scleral icterus.   No red eye   Cardiovascular: Normal rate and regular rhythm.    No murmur heard.  Pulmonary/Chest: Effort normal and breath sounds normal. No respiratory distress. She has no wheezes. She has no rales.   Abdominal: Soft. Bowel sounds are normal. She exhibits no distension. There is no tenderness. There is no rebound.   Musculoskeletal: She exhibits no edema.   Neurological: She is alert and oriented to  person, place, and time. No cranial nerve deficit. Coordination normal. GCS score is 15.   Skin: Skin is warm. No rash noted. No erythema.   Psychiatric: Mood, memory, affect and judgment normal.     Results of Labs/imaging   Labs and radiology reports have been reviewed.    Hospitalist   Signed by:   [place]  06/04/2016 10:19 AM    *This note was generated by the Epic EMR system/ Dragon speech recognition and may contain inherent errors or omissions not intended by the user. Grammatical errors, random word insertions, deletions, pronoun errors and incomplete sentences are occasional consequences of this technology due to software limitations. Not all errors are caught or corrected. If there are questions or concerns about the content of this note or information contained within the body of this dictation they should be addressed directly with the  author for clarification

## 2016-06-04 NOTE — Progress Notes (Addendum)
[place]    Date Time: 06/04/16 10:00 AM  Patient Name: Jacqueline Weber       Patient Active Problem List   Diagnosis   . Type 2 diabetes mellitus without complication   . HTN (hypertension) urgency   . Hyperlipidemia   . History of CVA (cerebrovascular accident)   . Chest pain, unspecified type   . SOB (shortness of breath)   . Nausea   . Elevated d-dimer   . Hypertensive emergency   . Elevated brain natriuretic peptide (BNP) level   . Hyponatremia   . Renal insufficiency   . Chest pain       Assessment:      Recurrent hypertensive crisis with chest pain,BP improved   Widened mediastinum in CXR - TEE neg for dissection   AKI on CKD 4 Scr up to 2.6, slowly improving 2.0 today ? baseline   Type II MI in setting of hypertensive crisis last week, TnI went up to 8.03 - cath deferred due to renal insufficiency   Abnormal OPnuclear stress test findings, February 2017, nuclear stress test, small-sized, mild intensity inferoapical ischemia, EF 72%.   February 2017, echo, EF 70%, concentric LVH, grade I diastolic dysfunction,    mild AI.    Recommendations:    Start mucomyst 600 mg po BID   Plan for LHC in am if Cr continues to improve-will check in am, pt also needs to sign consent   Per nephrology, OK to consider renal angiogram if having coronary angiogram but given the renal size discrepancy not sure if renal atrophy would improve   Start gentle IV hydration in am as per nephrology   BP improved overall   Cardiac cath discussed in detail with pt and her son at bedside acting as interpreter. Discussed risks and benefits of procedure including but not limited to death, MI, CVA, need for emergency CT surgery, renal failure requiring dialysis, arrhythmia, bleeding, infection, adverse reaction to conscious sedation. Will discuss with son further today.    Addendum:  Pt had opportunity to ask questions and discuss cath further with family. She is wiling to proceed  and signed consent is on chart    Medications:      Scheduled Meds: PRN Meds:      aspirin EC 325 mg Oral Daily   bisacodyl 10 mg Oral Daily   carvedilol 25 mg Oral BID Meals   cloNIDine 0.1 mg Oral QHS   heparin (porcine) 5,000 Units Subcutaneous Q12H [facility]   hydrALAZINE 100 mg Oral Q8H [facility]   hydroCHLOROthiazide 25 mg Oral Daily   insulin lispro 1-3 Units Subcutaneous QHS   insulin lispro 1-5 Units Subcutaneous TID AC   losartan 100 mg Oral Daily   NIFEdipine ER 30 mg Oral Daily   nitroglycerin 0.5 inch Topical Q6H HOLD MN   senna-docusate 2 tablet Oral QHS   simvastatin 10 mg Oral QHS       Continuous Infusions:      dextrose 15 g of glucose PRN   And     dextrose 125 mL PRN   And     glucagon (rDNA) 1 mg PRN   hydrALAZINE 10 mg Q6H PRN   labetalol 10 mg Q6H PRN   morphine 2 mg Q4H PRN             Subjective:   Denies chest pain, SOB or palpitations.      Physical Exam:     Vitals:  06/04/16 0906   BP: 131/66   Pulse:    Resp: 18   Temp: 97.3 F (36.3 C)   SpO2: 96%     Temp (24hrs), Avg:97.7 F (36.5 C), Min:97.2 F (36.2 C), Max:98.6 F (37 C)      Telemetry reviewed, NSR 80s     Intake and Output Summary (Last 24 hours) at Date Time    Intake/Output Summary (Last 24 hours) at 06/04/16 1000  Last data filed at 06/04/16 0844   Gross per 24 hour   Intake              860 ml   Output                0 ml   Net              860 ml       General Appearance:  Breathing comfortable, no acute distress  Head:  normocephalic  Eyes:  EOM's intact  Neck:  No jugular venous distension  Lungs:  Clear to auscultation throughout, no wheezes, rhonchi or rales, good respiratory effort   Chest Wall:  No tenderness or deformity  Heart:  S1, S2 normal, no S3, no S4, no murmur   Abdomen:  Soft, non-tender, positive bowel sounds  Extremities:  No cyanosis, clubbing or edema  Pulses:  Equal radial pulses, 4/4 symmetric  Neurologic:  Alert and oriented x3, mood and affect normal  Musculoskeletal: normal strength and tone    Labs:      Recent Labs  Lab 05/31/16  1409 05/31/16  0634 05/31/16  0053   Troponin I 0.03 0.03 0.01               Recent Labs  Lab 06/04/16  0650   Bilirubin, Total 0.3   Protein, Total 5.7*   Albumin 2.4*   ALT 10   AST (SGOT) 14       Recent Labs  Lab 06/04/16  0650   Magnesium 1.9           Recent Labs  Lab 06/04/16  0650 06/03/16  0428 06/02/16  0421   WBC 6.13 5.37 5.61   Hgb 11.0* 10.2* 9.5*   Hematocrit 34.4* 31.8* 30.0*   Platelets 258 226 219       Recent Labs  Lab 06/04/16  0650 06/03/16  0428 06/02/16  0421   Sodium 136 135* 135*   Potassium 4.3 4.3 4.1   Chloride 108 109 110   CO2 18* 20* 17*   BUN 25.8* 26.1* 27.3*   Creatinine 2.0* 2.1* 2.1*   EGFR 25.1 23.7 23.7   Glucose 127* 101* 116*   Calcium 8.9 8.4* 8.3*           Invalid input(s): FREET4    .  Lab Results   Component Value Date    BNP 504.8 (H) 05/31/2016      Estimated Creatinine Clearance: 24 mL/min (based on SCr of 2 mg/dL).    Weight Monitoring 05/22/2016 05/25/2016 05/30/2016 05/31/2016 06/02/2016 06/03/2016 06/03/2016   Height 154.9 cm 152.4 cm 152.4 cm - - - 152.4 cm   Height Method - - Stated - - - -   Weight 64.683 kg 65.318 kg 64.864 kg 64.4 kg 66.1 kg 64 kg 64.003 kg   Weight Method - - Stated Bed Scale Bed Scale Bed Scale -   BMI (calculated) 27 kg/m2 28.2 kg/m2 28 kg/m2 - - - 27.6 kg/m2  Imaging:   Radiological Procedure reviewed.              Signed by: Mariana Kaufman, NP    Patient seen and examined, my assessment and plans as noted above.    Signed by    Aleen Campi, MD      [facility]  NP [place] (8am-5pm)  MD Spectralink [phone] (8am-5pm)  After hours, non urgent consult line [phone]  After Hours, urgent consults [phone]

## 2016-06-04 NOTE — Plan of Care (Signed)
Problem: Safety  Goal: Patient will be free from injury during hospitalization  Outcome: Progressing  Hourly rounding was started and was emphasized per Hospital protocol. Bed on lowest position and locked. Pt family member are taking turns to take pt to the bathroom. Call bell is within easy reach.     Problem: Pain  Goal: Pain at adequate level as identified by patient  Outcome: Progressing      Problem: Hemodynamic Status: Cardiac  Goal: Stable vital signs and fluid balance  Outcome: Progressing   06/04/16 7921   Goal/Interventions addressed this shift   Stable vital signs and fluid balance Monitor/assess vital signs and telemetry per unit protocol;Weigh on admission and record weight daily;Assess signs and symptoms associated with cardiac rhythm changes;Monitor intake/output per unit protocol and/or LIP order;Monitor lab values;Monitor for leg swelling/edema and report to LIP if abnormal       Comments: Pt is stable. Blood works was monitored for possible cardiac cath on Monday.

## 2016-06-04 NOTE — PT Eval Note (Signed)
[facility]  [place], [place]    Department of Rehabilitation  [phone]    Physical Therapy Evaluation    Patient: Jacqueline Weber    MRN#: [id number]     M205/M205-A    Time of treatment: Time Calculation  PT Received On: 06/04/16  Start Time: 9201  Stop Time: 1100  Time Calculation (min): 20 min  Total Treatment Time (min): 15    PT Visit Number: 1    Consult received for [name] for PT Evaluation and Treatment.  Patient's medical condition is appropriate for Physical therapy intervention at this time.      Assessment:   Jacqueline Weber is a 65 y.o. female admitted 05/31/2016.  Pt's functional mobility is impacted by:  decreased activity tolerance.  There are a few comorbidities or other factors that affect plan of care and require modification of task including: has stairs to manage and chest pain.  Standardized tests and exams incorporated into evaluation include AMPAC mobility, ROM  and Strength.  Pt demonstrates a unstable clinical presentation due to chest pain and hypertensive crisis on admission (planned heart catheterization tomorrow).   Pt would continue to benefit from PT to address these deficits and increase functional independence.     Complexity Level Hx and Co  morbidites Examination Clinical Decision Making Clinical Presentation   Low no impact 1-2 elements Limited options Stable   Moderate   1-2 factors 3 or more   Several options Evolving, plan may alter   High 3 or more 4 or more Multiple options Unstable, unpredictable       Impairments: Assessment: Decreased endurance/activity tolerance.     Therapy Diagnosis: generalized weakness and decreased functional mobility  due to chest pain and recent hospitalization for non STEMI. Without therapy interventions, patient is at risk for decreased independence and failure to return to PLOF.    Rehabilitation Potential: Prognosis: Good;With continued PT status post acute discharge;24 hour supervision  recommended      Plan:    Treatment/Interventions: Exercise;Gait training;Stair training;Endurance training;Equipment eval/education;Continued evaluation PT Frequency: 3-4x/wk    Risks/Benefits/POC Discussed with Pt/Family: With patient/family          Goals:   Goals  Goal Formulation: With patient/family  Time for Goal Acheivement: By time of discharge  Goals: Select goal  Pt Will Go Supine To Sit: modified independent  Pt Will Perform Sit to Stand: with supervision;to maximize functional mobility and independence  Pt Will Transfer Bed/Chair: with supervision;to maximize functional mobility and independence  Pt Will Ambulate: > 200 feet;with stand by assist;to maximize functional mobility and independence (with VSS and least restrictive AD (need to clarify use of AD)  Pt Will Go Up / Down Stairs: 1 flight;with contact guard assist;to maximize functional mobility and independence  Pt Will Perform Home Exer Program: with supervision      Discharge Recommendations:   Based on today's session patient's discharge recommendation is the following: Discharge Recommendation: Home with supervision;Home with home health PT     If Discharge Recommendation: Home with supervision;Home with home health PT is not available, then the patient will need increase supervision , 24/7 supervision and assistance with ADL's.         Precautions and Contraindications:   Precautions  Other Precautions: recent non STEMI (week prior to this admission)    Medical Diagnosis: Shortness of breath [R06.02]  Hyponatremia [E87.1]  SOB (shortness of breath) [R06.02]  Hyperglycemia [R73.9]  Renal insufficiency [N28.9]  Hypertensive urgency [I16.0]  Elevated brain natriuretic peptide (BNP) level [R79.89]  Hypertensive emergency [I16.1]  Elevated d-dimer [R79.89]  Other secondary hypertension [I15.8]  Chest pain, unspecified type [R07.9]    History of Present Illness: Jacqueline Weber is a 65 y.o. female admitted on 05/31/2016 with chest pain in a patient  who recently had a non-STEMI last week with abnormal stress test.  The patient's first 2 sets of troponins have been negative.  No acute changes on EKG concerning for MI.  I am more concerned that this patient has chest pain secondary to hypertensive crisis and we need to rule out aortic dissection in this patient who has elevated d-dimer.  However, we are unable to get CT and a gram of the chest due to her creatinine being elevated at 2.6.  Plan would be to transfer this patient to ICU for anti-tensive drip, most likely with labetalol for better control of blood pressure and subsequently needs transesophageal echocardiogram for evaluation for possible aortic dissection and being fully aware that it is not the choice of test, but at least we have some idea.  The patient ideally needs a cardiac cath and had extensive discussion with both the patient and the daughter regarding the risks and benefits and they said they will decide after discussing with cardiology and nephrology about proceeding with a cardiac cath.       2.  Shortness of breath and elevated d-dimer; will need to rule out PE.  Patient will have a VQ scan.  We are unable to obtain CT and a gram of the chest.  Due to the patient's acute kidney injury.       3.  Hyponatremia; will check urine and serum osmolality along with random urine sodium.       4.  Hypertensive crisis; will start oral medications along with IV drip for better control of blood pressure and transfer the patient to ICU.  Per H and P        Patient Active Problem List   Diagnosis   . Type 2 diabetes mellitus without complication   . HTN (hypertension) urgency   . Hyperlipidemia   . History of CVA (cerebrovascular accident)   . Chest pain, unspecified type   . SOB (shortness of breath)   . Nausea   . Elevated d-dimer   . Hypertensive emergency   . Elevated brain natriuretic peptide (BNP) level   . Hyponatremia   . Renal insufficiency   . Chest pain        Past Medical/Surgical  History:  Past Medical History:   Diagnosis Date   . Cataracts, bilateral    . Cerebrovascular accident    . Chronic kidney disease    . CVA (cerebral infarction) 03/2013   . Diabetes mellitus    . Hypercholesteremia    . Hypertension    . Myocardial infarction       History reviewed. No pertinent surgical history.      X-Rays/Tests/Labs:  NM Pulmonary Vent/Perf (VQ Scan) [XVQ008] (Order [id number])   Status: Final result   Study Result     IMPRESSION:    Low probability for pulmonary embolism.     Ruby Cola, MD   05/31/2016 10:43 AM       Study Result   Transesophageal Echocardiogram     Sonographer- DJ     CLINICAL HISTORY- chest pain, severe hypertension, rule out aortic  dissection           IMPRESSION:  CONCLUSION:      1. No evidence of an aortic dissection  2. Dynamic left ventricular function with an EF of 70%   3. Adequately functioning cardiac valves       Social History: per son, pt was at home after most recent admission and was able to go up/down stairs with supervisiton.  Prior Level of Function  Prior level of function: Independent with ADLs, Ambulates independently (son reports that she wasn't walking with AD at home)  Assistive Device: Front wheel walker  Baseline Activity Level: Household ambulation  Driving: does not drive  Cooking: No  DME Currently at Home:  ([facility] per cm note)  Home Living Arrangements  Living Arrangements: Children  Type of Home: House  Home Layout: Multi-level, Bed/bath upstairs (full flight up to bedroom)  Bathroom Shower/Tub: Tub/shower unit  [employer]: Grab bars in shower  DME Currently at Home:  ([facility] per cm note)      Subjective:    Patient is agreeable to participation in the therapy session. Family and/or guardian are agreeable to patient's participation in the therapy session. Nursing clears patient for therapy. Son present throughout and per pt's preference, son was translating for pt.   Patient Goal  Patient Goal:  (Pt didn't state any goal- son is  glad pt is having heart cath tomorrow)  Pain Assessment  Pain Assessment: No/denies pain    Objective:   Observation of Patient/Vital Signs:  Patient is in bed with telemetry and peripheral IV in place.  HR 80 at rest and 77 after session via telemetry.  Pt denies chest pain during entire session and denies any SOB.       Cognition/Neuro Status  Arousal/Alertness: Appropriate responses to stimuli  Attention Span: Appears intact  Orientation Level: Oriented X4  Memory: Appears intact  Following Commands: Follows one step commands without difficulty  Safety Awareness: minimal verbal instruction  Behavior: attentive;calm;cooperative  Motor Planning: intact  Hand Dominance: right handed      Hearing: WNL        Gross ROM  Right Upper Extremity ROM: within functional limits  Left Upper Extremity ROM: within functional limits  Right Lower Extremity ROM: within functional limits  Left Lower Extremity ROM: within functional limits  Gross Strength  Right Upper Extremity Strength: 4+/5  Left Upper Extremity Strength: 4+/5  Right Lower Extremity Strength: 4+/5 (except hip flexion 4-/5)  Left Lower Extremity Strength: 4+/5 (except hip flexion 4-/5)  Tone  Tone: within functional limits    Functional Mobility  Supine to Sit: Stand by Assist  Scooting to EOB: Stand by Assist  Sit to Stand: Nurse, learning disability  Stand to Sit: Comptroller  Ambulation: Environmental health practitioner (Feet):  (60)  Pattern: decreased cadence     Balance  Balance: seated EOB- good static and dynamic- standing: CGA for dynamic standing/walking- noted 2 LOB with head turning that pt self corrected. Need to assess if pt truly doesn't use AD at home- possibly trial of use of RW.    Participation and Endurance  Participation Effort: good  Endurance: Tolerates 10 - 20 min exercise with multiple rests  Borg RPE Modified Scale 0-10: 2 - Light                 Treatment Activities: Pt up to BS chair with CB and phone in reach  after session. Son present in the room. Pt instructed not to get up without assistance  of nursing staff.  Pt performed 20 reps B LE APs, 20 reps B LE LAQ, hip flexion, and hip ab/adduction while seated. Pt denies SOB or CP. Encouraged pt to be OOB during the day, to take frequent rests between activities, daily seated therapeutic exercises, and hourly APs while awake for LE circulation.    Educated the patient to role of physical therapy, plan of care, goals of therapy and HEP, safety with mobility and ADLs.

## 2016-06-05 LAB — PT/INR
PT INR: 1
PT: 12.9 s (ref 12.6–15.0)

## 2016-06-05 LAB — PROTEIN ELECTROPHORESIS, URINE
Alpha 1, Urine: 5.5 %
Alpha 2, Urine: 5.3 %
Beta, Urine: 9.1 %
Protein, UR: 249.9 mg/dL — ABNORMAL HIGH (ref 1.0–14.0)
Urine Albumin%: 70.3 %
Urine Gamma %: 9.8 %

## 2016-06-05 LAB — BASIC METABOLIC PANEL
Anion Gap: 10 (ref 5.0–15.0)
BUN: 26.5 mg/dL — ABNORMAL HIGH (ref 7.0–19.0)
CO2: 18 mEq/L — ABNORMAL LOW (ref 22–29)
Calcium: 8.9 mg/dL (ref 8.5–10.5)
Chloride: 108 mEq/L (ref 100–111)
Creatinine: 2 mg/dL — ABNORMAL HIGH (ref 0.6–1.0)
Glucose: 113 mg/dL — ABNORMAL HIGH (ref 70–100)
Potassium: 4 mEq/L (ref 3.5–5.1)
Sodium: 136 mEq/L (ref 136–145)

## 2016-06-05 LAB — PROTEIN ELECTROPHORESIS, SERUM
Albumin %: 41.7 % — ABNORMAL LOW (ref 46.6–62.6)
Albumin, Synovial: 1.9 g/dL — ABNORMAL LOW (ref 3.4–4.8)
Alpha-1 Glob %: 4.2 % — ABNORMAL HIGH (ref 1.7–4.1)
Alpha-1 Globulin: 0.2 g/dL (ref 0.1–0.4)
Alpha-2 Glob %: 20.4 % — ABNORMAL HIGH (ref 8.9–14.9)
Alpha-2 Globulin: 0.9 g/dL (ref 0.8–1.2)
Beta Glob %: 13.7 % (ref 10.9–18.9)
Beta Globulin: 0.6 g/dL (ref 0.6–1.2)
Gamma Globulin %: 20.1 % (ref 9.8–24.4)
Gamma Globulin: 0.9 g/dL (ref 0.6–1.7)
Protein, Total: 4.6 g/dL — ABNORMAL LOW (ref 6.0–8.3)

## 2016-06-05 LAB — GLUCOSE WHOLE BLOOD - POCT
Whole Blood Glucose POCT: 145 mg/dL — ABNORMAL HIGH (ref 70–100)
Whole Blood Glucose POCT: 131 mg/dL — ABNORMAL HIGH (ref 70–100)
Whole Blood Glucose POCT: 149 mg/dL — ABNORMAL HIGH (ref 70–100)
Whole Blood Glucose POCT: 135 mg/dL — ABNORMAL HIGH (ref 70–100)

## 2016-06-05 LAB — IMMUNOFIXATION ELECTROPHORESIS

## 2016-06-05 LAB — SERUM PROTEIN ELECTROPHORESIS REVIEW

## 2016-06-05 LAB — APTT: PTT: 40 s — ABNORMAL HIGH (ref 23–37)

## 2016-06-05 LAB — IFE REVIEW, SERUM

## 2016-06-05 LAB — ELECTROPHORESIS REVIEW, URINE

## 2016-06-05 LAB — GFR: EGFR: 25.1

## 2016-06-05 MED ORDER — CLONIDINE HCL 0.1 MG PO TABS
0.1000 mg | ORAL_TABLET | Freq: Two times a day (BID) | ORAL | Status: DC
Start: 2016-06-05 — End: 2016-06-06
  Administered 2016-06-05: 0.1 mg via ORAL
  Filled 2016-06-05: qty 1

## 2016-06-05 NOTE — Plan of Care (Signed)
Problem: Hemodynamic Status: Cardiac  Goal: Stable vital signs and fluid balance  Outcome: Progressing   06/05/16 1452   Goal/Interventions addressed this shift   Stable vital signs and fluid balance Monitor/assess vital signs and telemetry per unit protocol;Weigh on admission and record weight daily;Assess signs and symptoms associated with cardiac rhythm changes;Monitor intake/output per unit protocol and/or LIP order;Monitor lab values;Monitor for leg swelling/edema and report to LIP if abnormal       Problem: Inadequate Tissue Perfusion  Goal: Adequate tissue perfusion will be maintained  Outcome: Progressing   06/05/16 1452   Goal/Interventions addressed this shift   Adequate tissue perfusion will be maintained Monitor/assess vital signs;Monitor/assess lab values and report abnormal values;Monitor/assess neurovascular status (pulses, capillary refill, pain, paresthesia, paralysis, presence of edema);Monitor intake and output;Monitor for signs and symptoms of a pulmonary embolism (dyspnea, tachypnea, tachycardia, confusion);VTE Prevention: Administer anticoagulant(s) and/or apply anti-embolism stockings/devices as ordered;Encourage/assist patient as needed to turn, cough, and perform deep breathing every 2 hours;Perform active/passive ROM;Increase mobility as tolerated/progressive mobility;Position patient for maximum circulation/cardiac output

## 2016-06-05 NOTE — Progress Notes (Signed)
[facility]  Progress Note  Patient Info:   Date/Time: 06/05/2016 / 5:30 PM   Admit Date:05/31/2016  Patient Name:Jacqueline Weber   WUJ:[id number]   PCP: John Giovanni, MD  Attending Physician:Nik Gorrell, Silva Bandy, MD     Subjective:   06/05/16      any chest pain, shortness of breath, headache, right visual blurriness remained the same.  No eye pain    Chief Complaint:  Chest Pain  Update of Review of Systems:  Review of Systems   Constitutional: Negative for fever and malaise/fatigue.   HENT: Negative for hearing loss.    Eyes: Positive for blurred vision.        Right eye, chronic x 1 year for glaucoma   Respiratory: Negative for cough, sputum production and shortness of breath.    Cardiovascular: Negative for chest pain and leg swelling.   Gastrointestinal: Negative for constipation.   Genitourinary: Negative for dysuria.   Neurological: Negative for dizziness.     Assessment and Plan:   This is a very pleasant 64 year old lady who speaks [place] with past medical history of cerebrovascular accident, diabetes mellitus, hypercholesterolemia, hypertension, glaucoma, chronic kidney disease, recent hospitalization to intensive care on May 10, 2016 for chest pain and nuclear stress test done earlier this year showing inferolateral ischemia.  Patient was scheduled for left heart cath on September 2014.  However, blood work revealed creatinine of 1.9 and cardiac catheterization was postponed pending nephrology evaluation.  Patient presented to the emergency department on May 16, 2016 with severely elevated blood pressure and ST depression in the lateral leads with peak troponin 28.  Patient was transferred to ICU for blood pressure control and patient was discharged home with medical management. Now admitted to [facility] on May 31, 2016 for shortness of breath and chest pain.  Patient had elevated d-dimer widening of the mediastinum on chest x-ray and initial concern for aortic  dissection.  Patient creatinine on admission was 2.6.  CT angiogram scan is not possible to obtain due to renal function to rule out dissection.  Patient was then transferred to intensive care unit for blood pressure management.    1.  Hypertensive crisis.  Blood pressure intermittently spiking today as patient appeared to be anxious for the cardiac Today.  --- Continue Coreg 25 mg, clonidine 0.5 mg at bedtime, hydralazine 100 mg 3 times a day, hydrochlorothiazide 25 mg daily, Cozaar 100 mg daily,  nitroglycerin patch.  Procardia increased from 30 mg to 60 mg daily.    2.  Chest pain with recent non-STEMI in the setting of hypertensive crisis, last admission recently with peak troponin of 8.0, 30.  Cardiac catheterization tomorrow.   ---Troponins x 3 set normal this admission  --- Trans-esophageal echocardiogram in ICU, rule out dissection.  Mild non-mobile blood.  Aortic arch visualized and very limited.  No clear pathology noted.  No left atrial clot.  No shunt.  Hyperdynamic LV with ejection fraction of 70 percent and left ventricular hypertrophy present.  Mild aortic and mitral insufficiency.  ----Plan for cardiac catheterization and renal artery angiogram tomorrow.  It was not able to schedule today.  Continue Mucomyst today and IV fluid.    3.  Elevated d-dimer, much improved due to hypertensive crisis  --- VQ scan low probability on May 31, 2016    4.  Acute renal injury on chronic kidney disease, improving from 2.6-2.0  --Renal ultrasound on May 16, 2016 showed no hydronephrosis.  Asymmetric kidney size,  smaller on the right side  --- Renal artery ultrasound duplex on May 18, 2016.  Last admission showed no evidence of significant renal artery stenosis.  Limited visualization of the mid left renal artery.  Elevated resistive indices bilaterally.  This is often indicated for medical renal disease.  --Plan for renal artery angiogram    5.  Constipation for 1 week.  --- Continue MiraLAX,  Dulcolax    6.  History of glaucoma.  Patient is due to follow-up with primary ophthalmologist Dr. Lance Sell, but patient is in the hospital.  At present, and need to reschedule  --- No pain or vision changes, new from a baseline  ----Discussed with colleague of Dr. Rosendo Gros, Dr. Donney Dice on the phone. No new recommendation at this time. To reschedule another appointment soon after discharged.     7. Left kidney cysts.  Follow-up with Nephrology    Disposition: Continue present care.  Nothing by mouth from midnight again tonight for cardiac catheterization tomorrow.  Finding and plan discussed with patient and daughter at bedside who speaks English very well     DVT Prohylaxis: Heparin  Central Line/Foley Catheter/PICC line status: None  Code Status: Full Code  Disposition: Home  Type of Admission:Inpatient  Anticipated Length of Stay: > 2 midnights  Medical Necessity for stay: Hypertensive crisis  Hospital Problems:   Principal Problem:    Other chest pain  Active Problems:    Chest pain, unspecified type    Chest pain    Objective:     Vitals:    06/05/16 0950 06/05/16 1000 06/05/16 1318 06/05/16 1700   BP: 191/81 174/84 (!) 203/76 172/74   Pulse: 82 81 87 85   Resp:   12 16   Temp:   97.5 F (36.4 C) 97.3 F (36.3 C)   TempSrc:       SpO2:   96% 96%   Weight:       Height:         Physical Exam:   Physical Exam   Constitutional: She is oriented to person, place, and time. No distress.   Eyes: Pupils are equal, round, and reactive to light. No scleral icterus.   No red eye   Cardiovascular: Normal rate and regular rhythm.    No murmur heard.  Pulmonary/Chest: Effort normal and breath sounds normal. No respiratory distress. She has no wheezes. She has no rales.   Abdominal: Soft. Bowel sounds are normal. She exhibits no distension. There is no tenderness. There is no rebound.   Musculoskeletal: She exhibits no edema.   Neurological: She is alert and oriented to person, place, and time. No  cranial nerve deficit. Coordination normal. GCS score is 15.   Skin: Skin is warm. No rash noted. No erythema.   Psychiatric: Mood, memory, affect and judgment normal.     Results of Labs/imaging   Labs and radiology reports have been reviewed.    Hospitalist   Signed by:   [place]  06/05/2016 5:30 PM    *This note was generated by the Epic EMR system/ Dragon speech recognition and may contain inherent errors or omissions not intended by the user. Grammatical errors, random word insertions, deletions, pronoun errors and incomplete sentences are occasional consequences of this technology due to software limitations. Not all errors are caught or corrected. If there are questions or concerns about the content of this note or information contained within the body of this dictation they should be addressed directly  with the author for clarification

## 2016-06-05 NOTE — PT Progress Note (Addendum)
[facility]  [place]    Department of Rehabilitation  [phone]    Physical Therapy Daily Treatment Note    Patient: Jacqueline Weber    MRN#: [id number]   M205/M205-A    Time of Treatment: Start Time: 4037 Stop Time: 1520  Time Calculation: 25 min  PT Visit Number: 2    Patient's medical condition is appropriate for Physical Therapy intervention at this time.        Assessment:  Appears to be at baseline for mobility, appears safe to ambulate with nursing staff &/or family.    Does not appear to require further skilled acute PT services.  Progress:  Goals met       Plan:  Discontinue PT     Based on today's session patient's discharge recommendation is the following:  Home with supervision.             Subjective: Patient is agreeable to participation in the therapy session.  Pt's daughter present.  Pt and daughter indicate pt understands English well, however pt's daughter translated majority of what therapist said during session.  Nursing clears patient for therapy, reports blood pressure has improved since we had spoken earlier today about pt participating in PT.   Pain Assessment: No/denies pain       Objective: Observation of Patient/Vital Signs: patient vital signs performed by nursing staff, see flow chart for details.  Patient is in bed with telemetry, SCD's and peripheral IV in place.   Pt seen for functional activities and exercises as noted, BP supine in bed,prior to session:165/72, MAP 103, HR 82 bpm.  BP at end of session, after gait bout 176/76, MAP 109, HR 90 bpm.    Cognition/Neuro Status  Arousal/Alertness: Appropriate responses to stimuli  Attention Span: Appears intact  Following Commands: Follows all commands and directions without difficulty  Safety Awareness: independent  Insights: Fully aware of deficits  Behavior: attentive;calm;cooperative  Hand Dominance: right handed    Functional Mobility  Supine to Sit: Modified  Independent;Supervision;to Left;HOB raised;using bedrail  Scooting to EOB: Independent  Sit to Stand: Supervision  Stand to Sit: Stand by Assist  Toilet Transfer:  Stand by Assist  Patient ambulated to bathroom, educated in/demonstrated safe toilet transfer, independent with clothing management; stood at sink to wash hands w/o LOB, with supervision.      Locomotion  Ambulation: Supervision  Ambulation Distance: 300 Feet CW and CCW around Progressive Care Unit.  Pattern: decreased cadence;within functional limits  Stair Management: two rails; pt alternating between step-to pattern, and alternating pattern  Number of Stairs: 8  Door Management: Independent      Encouraged to perform LE therex throughout the day to decrease effects of immobility.  Encouraged to sit OOB as tolerated for pulmonary hygiene. Instructed not to get up without assistance for safety. Pt voiced understanding.    Neuro Re-Ed  Sitting Balance: independent at edge of bed and on commode, without support, normal trunk control and balance  Standing Balance: supervision without support in room and hallway for dynamic gait training variable environment/backgrounds on level surfaces and stairs     Treatment Activities:  Bed mobility, transfers, balance assessment, LE ROM and strength ex's, safety awareness and cognition assessed, and activity tolerance noted.      Educated the patient and daughter to role of physical therapy, plan of care, goals of therapy, HEP including AE ex's, safety with mobility and home safety. Patient left seated at edge of bed,  without needs and call bell within reach.  RN notified of session outcome and that pt requesting to sit at edge of bed, daughter present and will notify RN if pt needs to get up to the bathroom.

## 2016-06-05 NOTE — Plan of Care (Signed)
Problem: Safety  Goal: Patient will be free from injury during hospitalization  Outcome: Progressing   06/05/16 2144   Goal/Interventions addressed this shift   Patient will be free from injury during hospitalization  Provide and maintain safe environment;Assess patient's risk for falls and implement fall prevention plan of care per policy;Ensure appropriate safety devices are available at the bedside;Hourly rounding;Include patient/ family/ care giver in decisions related to safety;Use appropriate transfer methods     Daughter at bedside, informed RN and tech that she will be assisting pt to bathroom. RN instructed daughter to call if she is unable to assist pt and/or if pt feels dizzy or unsteady.    Problem: Hemodynamic Status: Cardiac  Goal: Stable vital signs and fluid balance  Outcome: Progressing   06/05/16 2144   Goal/Interventions addressed this shift   Stable vital signs and fluid balance Monitor/assess vital signs and telemetry per unit protocol;Weigh on admission and record weight daily;Assess signs and symptoms associated with cardiac rhythm changes;Monitor intake/output per unit protocol and/or LIP order;Monitor lab values;Monitor for leg swelling/edema and report to LIP if abnormal

## 2016-06-05 NOTE — Progress Notes (Signed)
[facility].  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 acetylcysteine  600 mg Oral BID   . aspirin EC  325 mg Oral Daily   . bisacodyl  10 mg Oral Daily   . carvedilol  25 mg Oral BID Meals   . cloNIDine  0.1 mg Oral BID   . heparin (porcine)  5,000 Units Subcutaneous Q12H [facility]   . hydrALAZINE  100 mg Oral Q8H [place]   . hydroCHLOROthiazide  25 mg Oral Daily   . insulin lispro  1-3 Units Subcutaneous QHS   . insulin lispro  1-5 Units Subcutaneous TID AC   . losartan  100 mg Oral Daily   . NIFEdipine ER  60 mg Oral Daily   . nitroglycerin  0.5 inch Topical Q6H HOLD [facility]   . senna-docusate  2 tablet Oral QHS   . simvastatin  10 mg Oral QHS     Continuous Infusions:  . sodium chloride 75 mL/hr at 06/05/16 1035     PRN Meds:Nursing communication: Adult Hypoglycemia Treatment Algorithm **AND** dextrose **AND** dextrose **AND** glucagon (rDNA) **AND** Nursing communication: Document Significant Event Note, hydrALAZINE, labetalol, morphine    Objective:  Vital signs in last 24 hours:  Temp:  [97.7 F (36.5 C)-99.1 F (37.3 C)] 98.3 F (36.8 C)  Heart Rate:  [72-91] 81  Resp Rate:  [16-20] 20  BP: (159-216)/(71-94) 174/84    Intake/Output from yesterday (07:01 - 07:00):  10/01 0701 - 10/02 0700  In: 1030 [P.O.:1030]  Out: 1451 [Urine:1450]     Physical Exam:   Gen: WD WN NAD   CV: S1 S2 N RRR   Chest: CTAB   Ab: ND NT soft no HSM +BS   Ext: No C/E    Labs:    Recent Labs  Lab 06/05/16  0552 06/04/16  0650 06/03/16  0428  06/01/16  0351 05/31/16  0053   Glucose 113*  127* 101* More results in Results Review 115* 152*   BUN 26.5* 25.8* 26.1* More results in Results Review 32.0* 38.6*   Creatinine 2.0* 2.0* 2.1* More results in Results Review 2.4* 2.6*   Calcium 8.9 8.9 8.4* More results in Results Review 8.1* 9.3   Sodium 136 136 135* More results in Results Review 136 129*   Potassium 4.0 4.3 4.3 More results in Results Review 5.1 4.8   Chloride 108 108 109 More results in Results Review 111 101   CO2 18* 18* 20* More results in Results Review 16* 16*   Albumin  --  2.4*  --   --  2.1* 2.7*   Magnesium  --  1.9  --   --  2.2 2.2   More results in Results Review = values in this interval not displayed.    Recent Labs  Lab 06/04/16  0650 06/03/16  0428 06/02/16  0421   WBC 6.13 5.37 5.61   Hgb 11.0* 10.2* 9.5*   Hematocrit 34.4* 31.8* 30.0*   MCV 69.8* 69.0*  69.6*   MCH 22.3* 22.1* 22.0*   MCHC 32.0 32.1 31.7*   RDW 18* 18* 18*   MPV 10.5 10.2 10.2   Platelets 258 226 219

## 2016-06-05 NOTE — Progress Notes (Addendum)
[place]    Date Time: 06/05/16 8:59 AM  Patient Name: Jacqueline Weber       Patient Active Problem List   Diagnosis   . Type 2 diabetes mellitus without complication   . HTN (hypertension) urgency   . Hyperlipidemia   . History of CVA (cerebrovascular accident)   . Chest pain, unspecified type   . SOB (shortness of breath)   . Nausea   . Elevated d-dimer   . Hypertensive emergency   . Elevated brain natriuretic peptide (BNP) level   . Hyponatremia   . Renal insufficiency   . Chest pain       Assessment:      Recurrent hypertensive crisis with chest pain,BP improved   Widened mediastinum in CXR - TEE neg for dissection   AKI on CKD 4 Scr up to 2.6, slowly improving 2.0 today ? baseline   Type II MI in setting of hypertensive crisis last week, TnI went up to 8.03 - cath deferred due to renal insufficiency   Abnormal OPnuclear stress test findings, February 2017, nuclear stress test, small-sized, mild intensity inferoapical ischemia, EF 72%.   February 2017, echo, EF 70%, concentric LVH, grade I diastolic dysfunction,    mild AI.    Recommendations:    Nifedipine increased today for improved BP control: still poorly controlled.   Continue all other CV medications.   Nephrology following.   Cath today/tomorrow (dependent on Cath lab availability): will give CL breakfast today and then reassess later for NPO vs. Full diet and reattempt cath scheduling tomorrow. Discussed with patient, family, RN.    ADDENDUM: No room on Cath Schedule today. Will plan for tomorrow. Will notify patient, family, RN.      Medications:      Scheduled Meds: PRN Meds:      acetylcysteine 600 mg Oral BID   aspirin EC 325 mg Oral Daily   bisacodyl 10 mg Oral Daily   carvedilol 25 mg Oral BID Meals   cloNIDine 0.1 mg Oral QHS   heparin (porcine) 5,000 Units Subcutaneous Q12H [facility]   hydrALAZINE 100 mg Oral Q8H [facility]   hydroCHLOROthiazide 25 mg Oral Daily   insulin lispro 1-3 Units  Subcutaneous QHS   insulin lispro 1-5 Units Subcutaneous TID AC   losartan 100 mg Oral Daily   NIFEdipine ER 60 mg Oral Daily   nitroglycerin 0.5 inch Topical Q6H HOLD [facility]   senna-docusate 2 tablet Oral QHS   simvastatin 10 mg Oral QHS       Continuous Infusions:  . sodium chloride 75 mL/hr at 06/04/16 2129      dextrose 15 g of glucose PRN   And     dextrose 125 mL PRN   And     glucagon (rDNA) 1 mg PRN   hydrALAZINE 10 mg Q6H PRN   labetalol 10 mg Q6H PRN   morphine 2 mg Q4H PRN             Subjective:   Denies chest pain, SOB or palpitations.      Physical Exam:     Vitals:    06/05/16 0800   BP: (!) 209/94   Pulse: 89   Resp:    Temp:    SpO2:      Temp (24hrs), Avg:98 F (36.7 C), Min:97.3 F (36.3 C), Max:99.1 F (37.3 C)      Telemetry reviewed no changes.     Intake and Output Summary (Last  24 hours) at Date Time    Intake/Output Summary (Last 24 hours) at 06/05/16 0859  Last data filed at 06/05/16 0510   Gross per 24 hour   Intake              790 ml   Output              901 ml   Net             -111 ml       General Appearance:  Breathing comfortable, no acute distress  Head:  normocephalic  Eyes:  EOM's intact  Neck:  No carotid bruit or jugular venous distension, brisk carotid upstroke  Lungs:  Clear to auscultation throughout, no wheezes, rhonchi or rales, good respiratory effort   Chest Wall:  No tenderness or deformity  Heart:  S1, S2 normal, no S3, no S4, no murmur, PMI not displaced, no rub   Abdomen:  Soft, non-tender, positive bowel sounds, no hepatojugular reflux  Extremities:  No cyanosis, clubbing or edema  Pulses:  Equal radial pulses, 4/4 symmetric  Neurologic:  Alert and oriented x3, mood and affect normal  Musculoskeletal: normal strength and tone    Labs:     Recent Labs  Lab 05/31/16  1409 05/31/16  0634 05/31/16  0053   Troponin I 0.03 0.03 0.01               Recent Labs  Lab 06/04/16  0650   Bilirubin, Total 0.3   Protein, Total 5.7*   Albumin 2.4*   ALT 10   AST (SGOT) 14        Recent Labs  Lab 06/04/16  0650   Magnesium 1.9       Recent Labs  Lab 06/05/16  0552   PT 12.9   PT INR 1.0   PTT 40*       Recent Labs  Lab 06/04/16  0650 06/03/16  0428 06/02/16  0421   WBC 6.13 5.37 5.61   Hgb 11.0* 10.2* 9.5*   Hematocrit 34.4* 31.8* 30.0*   Platelets 258 226 219       Recent Labs  Lab 06/05/16  0552 06/04/16  0650 06/03/16  0428   Sodium 136 136 135*   Potassium 4.0 4.3 4.3   Chloride 108 108 109   CO2 18* 18* 20*   BUN 26.5* 25.8* 26.1*   Creatinine 2.0* 2.0* 2.1*   EGFR 25.1 25.1 23.7   Glucose 113* 127* 101*   Calcium 8.9 8.9 8.4*           Invalid input(s): FREET4    .  Lab Results   Component Value Date    BNP 504.8 (H) 05/31/2016      Estimated Creatinine Clearance: 24.5 mL/min (based on SCr of 2 mg/dL).    Weight Monitoring 05/25/2016 05/30/2016 05/31/2016 06/02/2016 06/03/2016 06/03/2016 06/05/2016   Height 152.4 cm 152.4 cm - - - 152.4 cm -   Height Method - Stated - - - - -   Weight 65.318 kg 64.864 kg 64.4 kg 66.1 kg 64 kg 64.003 kg 66.18 kg   Weight Method - Stated Bed Scale Bed Scale Bed Scale - Bed Scale   BMI (calculated) 28.2 kg/m2 28 kg/m2 - - - 27.6 kg/m2 -         Imaging:   Radiological Procedure reviewed.              Signed by: Wandra Arthurs, MD  [place]  NP [facility] [phone] (8am-5pm)  MD [facility] [phone] (8am-5pm)  After hours, non urgent consult line [phone]  After Hours, urgent consults [phone]

## 2016-06-05 NOTE — Progress Notes (Signed)
Pt's BP went up to 216/88 one hour after nitro patch and hydralazine. Gave pt scheduled carvedilol. Spoke with cardiologist and he stated to give the PRN labetelol as well.

## 2016-06-06 ENCOUNTER — Encounter
Admission: EM | Disposition: A | Discharge status: Other Acute Inpatient Hospital | Payer: Self-pay | Source: Home / Self Care | Attending: Internal Medicine

## 2016-06-06 ENCOUNTER — Inpatient Hospital Stay: Payer: Medicare Other

## 2016-06-06 LAB — BASIC METABOLIC PANEL
Anion Gap: 9 (ref 5.0–15.0)
BUN: 22.1 mg/dL — ABNORMAL HIGH (ref 7.0–19.0)
CO2: 17 mEq/L — ABNORMAL LOW (ref 22–29)
Calcium: 8.9 mg/dL (ref 8.5–10.5)
Chloride: 111 mEq/L (ref 100–111)
Creatinine: 1.8 mg/dL — ABNORMAL HIGH (ref 0.6–1.0)
Glucose: 94 mg/dL (ref 70–100)
Potassium: 3.9 mEq/L (ref 3.5–5.1)
Sodium: 137 mEq/L (ref 136–145)

## 2016-06-06 LAB — GFR: EGFR: 28.3

## 2016-06-06 LAB — GLUCOSE WHOLE BLOOD - POCT
Whole Blood Glucose POCT: 122 mg/dL — ABNORMAL HIGH (ref 70–100)
Whole Blood Glucose POCT: 137 mg/dL — ABNORMAL HIGH (ref 70–100)
Whole Blood Glucose POCT: 133 mg/dL — ABNORMAL HIGH (ref 70–100)

## 2016-06-06 SURGERY — LEFT HEART CATH POSS PCI
Anesthesia: Conscious Sedation | Laterality: Left

## 2016-06-06 MED ORDER — CLONIDINE HCL 0.2 MG PO TABS
0.2000 mg | ORAL_TABLET | Freq: Two times a day (BID) | ORAL | Status: DC
Start: 2016-06-06 — End: 2016-06-08
  Administered 2016-06-06 – 2016-06-08 (×5): 0.2 mg via ORAL
  Filled 2016-06-06 (×5): qty 1

## 2016-06-06 MED ORDER — IODIXANOL 320 MG/ML IV SOLN
90.0000 mL | Freq: Once | INTRAVENOUS | Status: DC | PRN
Start: 2016-06-06 — End: 2016-06-08

## 2016-06-06 MED ORDER — NITROGLYCERIN IN D5W 200-5 MCG/ML-% IV SOLN VIAL
INTRAVENOUS | Status: AC
Start: 2016-06-06 — End: 2016-06-06
  Administered 2016-06-06: 400 ug via INTRA_ARTERIAL
  Filled 2016-06-06: qty 10

## 2016-06-06 MED ORDER — HEPARIN SODIUM (PORCINE) 1000 UNIT/ML IJ SOLN
INTRAMUSCULAR | Status: AC
Start: 2016-06-06 — End: 2016-06-06
  Administered 2016-06-06: 7500 [IU]
  Filled 2016-06-06: qty 10

## 2016-06-06 MED ORDER — DIPHENHYDRAMINE HCL 50 MG/ML IJ SOLN
INTRAMUSCULAR | Status: AC
Start: 2016-06-06 — End: 2016-06-06
  Administered 2016-06-06: 25 mg via INTRAVENOUS
  Filled 2016-06-06: qty 1

## 2016-06-06 MED ORDER — MIDAZOLAM HCL 2 MG/2ML IJ SOLN
INTRAMUSCULAR | Status: AC
Start: 2016-06-06 — End: 2016-06-06
  Administered 2016-06-06: 2 mg via INTRAVENOUS
  Filled 2016-06-06: qty 2

## 2016-06-06 MED ORDER — FENTANYL CITRATE (PF) 50 MCG/ML IJ SOLN (WRAP)
INTRAMUSCULAR | Status: AC
Start: 2016-06-06 — End: 2016-06-06
  Administered 2016-06-06: 50 ug via INTRAVENOUS
  Filled 2016-06-06: qty 2

## 2016-06-06 MED ORDER — LIDOCAINE HCL (PF) 1 % IJ SOLN
INTRAMUSCULAR | Status: AC
Start: 2016-06-06 — End: 2016-06-06
  Administered 2016-06-06: 8 mL via SUBCUTANEOUS
  Filled 2016-06-06: qty 30

## 2016-06-06 MED ORDER — FLUMAZENIL 1 MG/10ML IV SOLN
INTRAVENOUS | Status: DC
Start: 2016-06-06 — End: 2016-06-06
  Filled 2016-06-06: qty 10

## 2016-06-06 MED ORDER — ACETAMINOPHEN 325 MG PO TABS
650.0000 mg | ORAL_TABLET | ORAL | Status: DC | PRN
Start: 2016-06-06 — End: 2016-06-08

## 2016-06-06 MED ORDER — CARVEDILOL 12.5 MG PO TABS
25.0000 mg | ORAL_TABLET | Freq: Three times a day (TID) | ORAL | Status: DC
Start: 2016-06-06 — End: 2016-06-08
  Administered 2016-06-06 – 2016-06-08 (×7): 25 mg via ORAL
  Filled 2016-06-06 (×7): qty 2

## 2016-06-06 MED ORDER — SODIUM CHLORIDE 0.9 % IV SOLN
INTRAVENOUS | Status: AC
Start: 2016-06-06 — End: 2016-06-06

## 2016-06-06 MED ORDER — HYDRALAZINE HCL 20 MG/ML IJ SOLN
INTRAMUSCULAR | Status: AC
Start: 2016-06-06 — End: 2016-06-06
  Administered 2016-06-06: 10 mg via INTRAVENOUS
  Filled 2016-06-06: qty 1

## 2016-06-06 MED ORDER — NALOXONE HCL 0.4 MG/ML IJ SOLN (WRAP)
INTRAMUSCULAR | Status: DC
Start: 2016-06-06 — End: 2016-06-06
  Filled 2016-06-06: qty 1

## 2016-06-06 NOTE — Progress Notes (Addendum)
Home Health Referral          Referral from [facility] (Case Manager) for home health care upon discharge.    By [employer], the patient has the right to freely choose a home care provider.  Arrangements have been made with:     A company of the patients choosing. We have supplied the patient with a listing of providers in your area who asked to be included and participate in Medicare.   [facility], a home care agency that provides both adult home care services which is a wholly owned and operated by [employer] and participates in [employer]   The preferred provider of your insurance company. Choosing a home care provider other than your insurance company's preferred provider may affect your insurance coverage.    The Home Health Care Referral Form acknowledging the voluntary selection of the home care company has been completed, signed, and is on file.      Home Health Discharge Information     Your doctor has ordered Skilled Nursing and Physical Therapy in-home service(s) for you while you recuperate at home, to assist you in the transition from hospital to home.    The agency that you or your representative chose to provide the service:  Name of Post: [facility]. [phone])]  Patient is currently active with above home health agency.  Verified and resumption of care orders sent.    The above services were set up by:  Hansel Starling  ([place])   Phone      [phone]    Additional comments:   If you have not heard from your home health agency within 24-48 hours after discharge please call your agency to arrange a time for your first visit.  For any scheduling concerns or questions related to home health, such as time or date please contact your home health agency at the number listed above.     Signed by: Hansel Starling RN, BSN  Date Time: 06/06/16 10:47 AM

## 2016-06-06 NOTE — Plan of Care (Signed)
Problem: Safety  Goal: Patient will be free from injury during hospitalization  Outcome: Progressing   06/06/16 2212   Goal/Interventions addressed this shift   Patient will be free from injury during hospitalization  Assess patient's risk for falls and implement fall prevention plan of care per policy;Provide and maintain safe environment;Use appropriate transfer methods;Ensure appropriate safety devices are available at the bedside;Include patient/ family/ care giver in decisions related to safety;Hourly rounding     Daughter at bedside. Adequate room lighting. Pt knows to call if she needs help    Problem: Hemodynamic Status: Cardiac  Goal: Stable vital signs and fluid balance  Outcome: Progressing   06/06/16 2212   Goal/Interventions addressed this shift   Stable vital signs and fluid balance Monitor/assess vital signs and telemetry per unit protocol;Weigh on admission and record weight daily;Assess signs and symptoms associated with cardiac rhythm changes;Monitor intake/output per unit protocol and/or LIP order;Monitor lab values;Monitor for leg swelling/edema and report to LIP if abnormal     Monitoring BP, meds given to control high readings

## 2016-06-06 NOTE — Progress Notes (Signed)
[facility].  Progress Note    Assessment:   Admitted with chest pain   CKD IV   Severe hypertension    DM   Proteinuria   Anemia   3 V disease     Plan:   Cardiac cath per Cardiology   Continue with BP meds - will increase Coreg   CABG in future      Jacqueline Slough, MD  Office - [phone]  ++++++++++++++++++++++++++++++++++++++++++++++++++++++++++++++  Subjective:  No new complaints    Medications:  Scheduled Meds:  Current Facility-Administered Medications   Medication Dose Route Frequency   . aspirin EC  325 mg Oral Daily   . bisacodyl  10 mg Oral Daily   . carvedilol  25 mg Oral TID   . cloNIDine  0.2 mg Oral BID   . heparin (porcine)  5,000 Units Subcutaneous Q12H [facility]   . hydrALAZINE  100 mg Oral Q8H [place]   . hydroCHLOROthiazide  25 mg Oral Daily   . insulin lispro  1-3 Units Subcutaneous QHS   . insulin lispro  1-5 Units Subcutaneous TID AC   . losartan  100 mg Oral Daily   . NIFEdipine ER  60 mg Oral Daily   . nitroglycerin  0.5 inch Topical Q6H [facility]   . senna-docusate  2 tablet Oral QHS   . simvastatin  10 mg Oral QHS     Continuous Infusions:     PRN Meds:acetaminophen, Nursing communication: Adult Hypoglycemia Treatment Algorithm **AND** dextrose **AND** dextrose **AND** glucagon (rDNA) **AND** Nursing communication: Document Significant Event Note, hydrALAZINE, iodixanol, labetalol, morphine    Objective:  Vital signs in last 24 hours:  Temp:  [97.3 F (36.3 C)-99 F (37.2 C)] 98.4 F (36.9 C)  Heart Rate:  [67-106] 97  Resp Rate:  [14-19] 16  BP: (135-236)/(72-102) 236/101    Intake/Output from yesterday (07:01 - 07:00):  10/02 0701 - 10/03 0700  In: 8657 [P.O.:890; I.V.:900]  Out: 2050 [Urine:2050]     Physical Exam:   Gen: WD WN NAD   CV: S1 S2 N RRR   Chest: CTAB   Ab: ND NT soft no HSM +BS   Ext: No C/E    Labs:    Recent Labs  Lab 06/06/16  0449 06/05/16  0552 06/04/16  0650  06/01/16  0351 05/31/16  0053   Glucose 94 113* 127* More results in  Results Review 115* 152*   BUN 22.1* 26.5* 25.8* More results in Results Review 32.0* 38.6*   Creatinine 1.8* 2.0* 2.0* More results in Results Review 2.4* 2.6*   Calcium 8.9 8.9 8.9 More results in Results Review 8.1* 9.3   Sodium 137 136 136 More results in Results Review 136 129*   Potassium 3.9 4.0 4.3 More results in Results Review 5.1 4.8   Chloride 111 108 108 More results in Results Review 111 101   CO2 17* 18* 18* More results in Results Review 16* 16*   Albumin  --   --  2.4*  --  2.1* 2.7*   Magnesium  --   --  1.9  --  2.2 2.2   More results in Results Review = values in this interval not displayed.    Recent Labs  Lab 06/04/16  0650 06/03/16  0428 06/02/16  0421   WBC 6.13 5.37 5.61   Hgb 11.0* 10.2* 9.5*   Hematocrit 34.4* 31.8* 30.0*   MCV 69.8* 69.0* 69.6*   MCH 22.3*  22.1* 22.0*   MCHC 32.0 32.1 31.7*   RDW 18* 18* 18*   MPV 10.5 10.2 10.2   Platelets 258 226 219

## 2016-06-06 NOTE — Progress Notes (Signed)
Rounded with med team. Daughter by the bedside. Dispo  Home with HH.

## 2016-06-06 NOTE — Progress Notes (Signed)
H&P Update with ASA/MALLAMPATTI     Date Time: 06/06/16 6:40 AM    PROCEDURE   Left heart catheterization with possible coronary intervention    MALLAMPATTI AIRWAY CLASSIFICATION (II)      CLASS I- Full visibility of tonsils, uvula, and soft palate     CLASS II- Visibility of hard and soft palate, upper portion of tonsils and uvula  CLASS III-Soft and hard palate and base of uvula are visible    CLASS IV- Only hard palate visible      ASA PHYSICAL STATUS (III)     ASA 1   HEALTHY PATIENT  ASA 2   MILD SYSTEMIC ILLNESS  ASA 3   SYSTEMIC DISEASE, NOT INCAPACITATING  ASA 4   SEVERE SYSTEMIC DISEASE, IS CONSTANT THREAT TO LIFE  ASA 5   MORIBUND CONDITION, NOT EXPECTED TO LIVE >24 HOURS           IRRESPECTIVE OF PROCEDURE  E           EMERGENCY PROCEDURE       CONCLUSION:     The history and physical currently available in the chart has been reviewed and there are no significant interval changes since prior evaluation.  She has no complaints.  She was seen and examined by me prior to the procedure.  The risks, benefits and alternatives of the procedure have been discussed in detail and she has indicated that she understands the procedure, indications, and risks inherent to the procedure and is amenable to proceeding.  All questions were answered. Informed consent was signed and verified.        Signed by: Tedra Coupe, MD

## 2016-06-06 NOTE — Procedures (Signed)
BRIEF OP NOTE    Date Time: 06/06/16 8:00 AM    Patient Name:   Jacqueline Weber    Date of Operation:   06/06/2016    Providers Performing:   Tedra Coupe, MD        Operative Procedure:   Procedure(s):  LEFT HEART CATH POSS PCI    Preoperative Diagnosis:   Pre-Op Diagnosis Codes:   Non Q MI    Postoperative Diagnosis:   Severe 3 vessel CAD      Findings:   Hemodynamics: Normal LVEDP 10-15 mmHg, No aortic valve gradient. Severe arterial hypertension 190/80    LV Function: Known normal by TEE 70% EF without sig MR    RCA: Large and dominant, calcified vessel. 90% mid PDA lesion, 60% RPL lesions. Both moderately large vessels    Left Main: 20% distal lesion. Calcified    LAD: Calcified vessel. 90% proximal lesion, 50-60% mid lesion. 80% lesion in moderate sized diagonal    Circumflex: 90% proximal lesion above large marginal.     Abd. Aorta:  No sig disease in abd aorta or proximal renal arteries.    Complications:   None    IMP  Non Q MI  Severe 3 vessel disease with normal LV function  Severe hypertension without prox renal artery stenosis  IDDM  Hx CVA  Hyperlipidemia  Renal insufficiency. Total of 90 cc contrast used    PLAN  BP management per renal  Will refer for CABG  Follow renal function post cath  See orders.  Follow up in office after discharge  Follow up with PMD      Signed by: Tedra Coupe, MD                                                                              LO CARDIAC CATH/EP

## 2016-06-06 NOTE — Progress Notes (Signed)
[facility] HOSPITALIST  Progress Note  Patient Info:   Date/Time: 06/06/2016 / 11:59 PM   Admit Date:05/31/2016  Patient Name:Jacqueline Weber   RKY:[id number]   PCP: John Giovanni, MD  Attending Physician:Firman Petrow, Silva Bandy, MD     Subjective:   06/06/16     Came back from Cath lab. Denies any symptom. Feels OK    Chief Complaint:  Chest Pain  Update of Review of Systems:  Review of Systems   Constitutional: Negative for fever and malaise/fatigue.   HENT: Negative for hearing loss.    Eyes: Negative for blurred vision.        Right eye, chronic x 1 year for glaucoma   Respiratory: Negative for cough, sputum production and shortness of breath.    Cardiovascular: Negative for chest pain and leg swelling.   Gastrointestinal: Negative for constipation.   Genitourinary: Negative for dysuria.   Neurological: Negative for dizziness.     Assessment and Plan:   This is a very pleasant 65 year old lady who speaks [place] with past medical history of cerebrovascular accident, diabetes mellitus, hypercholesterolemia, hypertension, glaucoma, chronic kidney disease, recent hospitalization to intensive care on May 10, 2016 for chest pain and nuclear stress test done earlier this year showing inferolateral ischemia.  Patient was scheduled for left heart cath on September 2014.  However, blood work revealed creatinine of 1.9 and cardiac catheterization was postponed pending nephrology evaluation.  Patient presented to the emergency department on May 16, 2016 with severely elevated blood pressure and ST depression in the lateral leads with peak troponin 28.  Patient was transferred to ICU for blood pressure control and patient was discharged home with medical management. Now admitted to [facility] on May 31, 2016 for shortness of breath and chest pain.  Patient had elevated d-dimer widening of the mediastinum on chest x-ray and initial concern for aortic dissection.  Patient creatinine on admission was  2.6.  CT angiogram scan is not possible to obtain due to renal function to rule out dissection.  Patient was then transferred to intensive care unit for blood pressure management.    1.  Hypertensive crisis.  Blood pressure intermittently spiking   --- Continue Coreg 25 mg, clonidine 0.5 mg at bedtime, hydralazine 100 mg 3 times a day, hydrochlorothiazide 25 mg daily, Cozaar 100 mg daily,  nitroglycerin patch, Procardia 60 mg daily.    2.  Chest pain with recent non-STEMI in the setting of hypertensive crisis, last admission recently with peak troponin of 8.0, 30.  Cardiac catheterization tomorrow.   ---Troponins x 3 set normal this admission  --- Trans-esophageal echocardiogram in ICU, rule out dissection.  Mild non-mobile blood.  Aortic arch visualized and very limited.  No clear pathology noted.  No left atrial clot.  No shunt.  Hyperdynamic LV with ejection fraction of 70 percent and left ventricular hypertrophy present.  Mild aortic and mitral insufficiency.  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 Patient been accepted to have CABG on Thursday and will be transferred to [facility]    3.  Elevated d-dimer, much improved due to hypertensive crisis  --- VQ scan low probability on May 31, 2016    4.  Acute renal injury on chronic kidney disease, improving from 2.6-1.8  --Renal ultrasound on May 16, 2016 showed no hydronephrosis.  Asymmetric kidney size, smaller on the right side  --- Renal artery ultrasound duplex on May 18, 2016.  Last admission showed  no evidence of significant renal artery stenosis.  Limited visualization of the mid left renal artery.  Elevated resistive indices bilaterally.  This is often indicated for medical renal disease.  --Left heart cath today did not show proximal renal artery stenosis.    5.  Constipation for 1 week.Resolved.  --- Continue MiraLAX, Dulcolax    6.  History of glaucoma.  Patient is due to follow-up  with primary ophthalmologist Dr. Lance Sell, but patient is in the hospital.  At present, and need to reschedule  --- No pain or vision changes, new from a baseline  ----Discussed with colleague of Dr. Rosendo Gros, Dr. Donney Dice on the phone. No new recommendation at this time. To reschedule another appointment soon after discharged.     7. Left kidney cysts.  Follow-up with Nephrology    Disposition:Optimize blood pressure control. Monitor reanl function post cardiac cath. Will be transferred to [facility] on Thursday for CABG. Findings and plan                                                                                                                                                                                                             DVT Prohylaxis: Heparin  Central Line/Foley Catheter/PICC line status: None  Code Status: Full Code  Disposition: Home  Type of Admission:Inpatient  Anticipated Length of Stay: > 2 midnights  Medical Necessity for stay: Hypertensive crisis  Hospital Problems:   Principal Problem:    Other chest pain  Active Problems:    Chest pain, unspecified type    Chest pain    Objective:     Vitals:    06/06/16 1530 06/06/16 1600 06/06/16 1739 06/06/16 2122   BP: 156/71 144/66 136/68 131/71   Pulse: 82 80 76 75   Resp: 16  18 18    Temp:   98.8 F (37.1 C) 98.2 F (36.8 C)   TempSrc:   Oral    SpO2: 97%  96% 96%   Weight:       Height:         Physical Exam:   Physical Exam   Constitutional: She is oriented to person, place, and time. No distress.   Eyes: Pupils are equal, round, and reactive to light. No scleral icterus.   No red eye   Cardiovascular: Normal rate and regular rhythm.    No murmur heard.  Pulmonary/Chest: Effort normal and breath sounds normal. No respiratory distress. She has no wheezes. She has no rales.   Abdominal: Soft. Bowel sounds are normal.  She exhibits no distension. There is no tenderness. There is no rebound.   Musculoskeletal:  She exhibits no edema.   Neurological: She is alert and oriented to person, place, and time. No cranial nerve deficit. Coordination normal. GCS score is 15.   Skin: Skin is warm. No rash noted. No erythema.   Psychiatric: Mood, memory, affect and judgment normal.     Results of Labs/imaging   Labs and radiology reports have been reviewed.    Hospitalist   Signed by:   [place]  06/06/2016 11:59 PM    *This note was generated by the Epic EMR system/ Dragon speech recognition and may contain inherent errors or omissions not intended by the user. Grammatical errors, random word insertions, deletions, pronoun errors and incomplete sentences are occasional consequences of this technology due to software limitations. Not all errors are caught or corrected. If there are questions or concerns about the content of this note or information contained within the body of this dictation they should be addressed directly with the author for clarification

## 2016-06-06 NOTE — Discharge Instr - AVS First Page (Addendum)
Home Health Discharge Information     Your doctor has ordered Skilled Nursing and Physical Therapy in-home service(s) for you while you recuperate at home, to assist you in the transition from hospital to home.    The agency that you or your representative chose to provide the service:  Name of Brooks: [facility]. [phone])]  Patient is currently active with above home health agency.  Verified and resumption of care orders sent.    The above services were set up by:  Hansel Starling  ([place])   Phone      [phone]    Additional comments:   If you have not heard from your home health agency within 24-48 hours after discharge please call your agency to arrange a time for your first visit.  For any scheduling concerns or questions related to home health, such as time or date please contact your home health agency at the number listed above.

## 2016-06-07 LAB — BASIC METABOLIC PANEL
Anion Gap: 9 (ref 5.0–15.0)
BUN: 18.7 mg/dL (ref 7.0–19.0)
CO2: 16 mEq/L — ABNORMAL LOW (ref 22–29)
Calcium: 8.2 mg/dL — ABNORMAL LOW (ref 8.5–10.5)
Chloride: 110 mEq/L (ref 100–111)
Creatinine: 1.9 mg/dL — ABNORMAL HIGH (ref 0.6–1.0)
Glucose: 118 mg/dL — ABNORMAL HIGH (ref 70–100)
Potassium: 4.1 mEq/L (ref 3.5–5.1)
Sodium: 135 mEq/L — ABNORMAL LOW (ref 136–145)

## 2016-06-07 LAB — GFR: EGFR: 26.6

## 2016-06-07 LAB — GLUCOSE WHOLE BLOOD - POCT
Whole Blood Glucose POCT: 123 mg/dL — ABNORMAL HIGH (ref 70–100)
Whole Blood Glucose POCT: 138 mg/dL — ABNORMAL HIGH (ref 70–100)
Whole Blood Glucose POCT: 143 mg/dL — ABNORMAL HIGH (ref 70–100)
Whole Blood Glucose POCT: 155 mg/dL — ABNORMAL HIGH (ref 70–100)

## 2016-06-07 NOTE — Plan of Care (Signed)
Problem: Safety  Goal: Patient will be free from injury during hospitalization  Outcome: Progressing   06/07/16 2211   Goal/Interventions addressed this shift   Patient will be free from injury during hospitalization  Assess patient's risk for falls and implement fall prevention plan of care per policy;Provide and maintain safe environment;Use appropriate transfer methods;Ensure appropriate safety devices are available at the bedside;Include patient/ family/ care giver in decisions related to safety;Hourly rounding       Problem: Pain  Goal: Pain at adequate level as identified by patient  Outcome: Progressing   06/07/16 2211   Goal/Interventions addressed this shift   Pain at adequate level as identified by patient Identify patient comfort function goal;Assess pain on admission, during daily assessment and/or before any "as needed" intervention(s);Reassess pain within 30-60 minutes of any procedure/intervention, per Pain Assessment, Intervention, Reassessment (AIR) Cycle;Evaluate patient's satisfaction with pain management progress;Evaluate if patient comfort function goal is met;Offer non-pharmacological pain management interventions;Consult/collaborate with Pain Service

## 2016-06-07 NOTE — Progress Notes (Signed)
[facility]PITALIST  Progress Note  Patient Info:   Date/Time: 06/07/2016 / 7:07 PM   Admit Date:05/31/2016  Patient Name:Jacqueline Weber   GEZ:[id number]   PCP: John Giovanni, MD  Attending Physician:Jeannelle Wiens, Silva Bandy, MD     Subjective:   06/07/16      Feels OK. Daughter at bedside.     Chief Complaint:  Chest Pain  Update of Review of Systems:  Review of Systems   Constitutional: Negative for fever and malaise/fatigue.   HENT: Negative for hearing loss.    Eyes: Negative for blurred vision.        Right eye, chronic x 1 year for glaucoma   Respiratory: Negative for cough, sputum production and shortness of breath.    Cardiovascular: Negative for chest pain and leg swelling.   Gastrointestinal: Negative for constipation.   Genitourinary: Negative for dysuria.   Neurological: Negative for dizziness.     Assessment and Plan:   This is a very pleasant 65 year old lady who speaks [place] with past medical history of cerebrovascular accident, diabetes mellitus, hypercholesterolemia, hypertension, glaucoma, chronic kidney disease, recent hospitalization to intensive care on May 10, 2016 for chest pain and nuclear stress test done earlier this year showing inferolateral ischemia.  Patient was scheduled for left heart cath on September 2014.  However, blood work revealed creatinine of 1.9 and cardiac catheterization was postponed pending nephrology evaluation.  Patient presented to the emergency department on May 16, 2016 with severely elevated blood pressure and ST depression in the lateral leads with peak troponin 28.  Patient was transferred to ICU for blood pressure control and patient was discharged home with medical management. Now admitted to [facility] on May 31, 2016 for shortness of breath and chest pain.  Patient had elevated d-dimer widening of the mediastinum on chest x-ray and initial concern for aortic dissection.  Patient creatinine on admission was 2.6.  CT angiogram scan  is not possible to obtain due to renal function to rule out dissection.  Patient was then transferred to intensive care unit for blood pressure management.    1.  Hypertensive crisis.  Blood pressure improves today.   --- Continue Coreg 25 mg, clonidine 0.5 mg at bedtime, hydralazine 100 mg 3 times a day, hydrochlorothiazide 25 mg daily, Cozaar 100 mg daily,  nitroglycerin patch, Procardia 60 mg daily.    2.  Chest pain with recent non-STEMI in the setting of hypertensive crisis, last admission recently with peak troponin of 8.0, 30.  Cardiac catheterization tomorrow.   ---Troponins x 3 set normal this admission  --- Trans-esophageal echocardiogram in ICU, rule out dissection.  Mild non-mobile blood.  Aortic arch visualized and very limited.  No clear pathology noted.  No left atrial clot.  No shunt.  Hyperdynamic LV with ejection fraction of 70 percent and left ventricular hypertrophy present.  Mild aortic and mitral insufficiency.  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 Patient been accepted to have CABG on Thursday and will be transferred to [facility] tomorrow.     3.  Elevated d-dimer, much improved due to hypertensive crisis  --- VQ scan low probability on May 31, 2016    4.  Acute renal injury on chronic kidney disease, improving from 2.6-1.8  --Renal ultrasound on May 16, 2016 showed no hydronephrosis.  Asymmetric kidney size, smaller on the right side  --- Renal artery ultrasound duplex on May 18, 2016.  Last admission showed no evidence  of significant renal artery stenosis.  Limited visualization of the mid left renal artery.  Elevated resistive indices bilaterally.  This is often indicated for medical renal disease.  --Left heart cath today did not show proximal renal artery stenosis.    5.  Constipation for 1 week.Resolved.  --- Continue MiraLAX, Dulcolax    6.  History of glaucoma.  Patient is due to follow-up with primary  ophthalmologist Dr. Lance Sell, but patient is in the hospital.  At present, and need to reschedule  --- No pain or vision changes, new from a baseline  ----Discussed with colleague of Dr. Rosendo Gros, Dr. Donney Dice on the phone. No new recommendation at this time. To reschedule another appointment soon after discharged.     7. Left kidney cysts.  Follow-up with Nephrology    Disposition:    To be transferred to [facility]. No need to be NPO tonight per Cardiology.    Discussed with patient and daughter.                                                                                                                                                                                                        DVT Prohylaxis: Heparin  Central Line/Foley Catheter/PICC line status: None  Code Status: Full Code  Disposition: Home  Type of Admission:Inpatient  Anticipated Length of Stay: > 2 midnights  Medical Necessity for stay: Hypertensive crisis  Hospital Problems:   Principal Problem:    Other chest pain  Active Problems:    Chest pain, unspecified type    Chest pain    Objective:     Vitals:    06/07/16 0503 06/07/16 0929 06/07/16 1241 06/07/16 1841   BP: 143/68 138/69 148/67 106/60   Pulse: 74 77 74 68   Resp: 18 18 20 18    Temp: 98.2 F (36.8 C) 99.1 F (37.3 C) 98.2 F (36.8 C) 97.7 F (36.5 C)   TempSrc: Temporal Artery  Oral Oral   SpO2: 95% 97% 96% 97%   Weight: 65.7 kg (144 lb 12.8 oz)      Height:         Physical Exam:   Physical Exam   Constitutional: She is oriented to person, place, and time. No distress.   Eyes: Pupils are equal, round, and reactive to light. No scleral icterus.   No red eye   Cardiovascular: Normal rate and regular rhythm.    No murmur heard.  Pulmonary/Chest: Effort normal and breath sounds normal. No respiratory distress. She has no wheezes. She has no rales.  Abdominal: Soft. Bowel sounds are normal. She exhibits no distension. There is no tenderness. There is no  rebound.   Musculoskeletal: She exhibits no edema.   Neurological: She is alert and oriented to person, place, and time. No cranial nerve deficit. Coordination normal. GCS score is 15.   Skin: Skin is warm. No rash noted. No erythema.   Psychiatric: Mood, memory, affect and judgment normal.     Results of Labs/imaging   Labs and radiology reports have been reviewed.    Hospitalist   Signed by:   [place]  06/07/2016 7:07 PM    *This note was generated by the Epic EMR system/ Dragon speech recognition and may contain inherent errors or omissions not intended by the user. Grammatical errors, random word insertions, deletions, pronoun errors and incomplete sentences are occasional consequences of this technology due to software limitations. Not all errors are caught or corrected. If there are questions or concerns about the content of this note or information contained within the body of this dictation they should be addressed directly with the author for clarification

## 2016-06-07 NOTE — Progress Notes (Signed)
[facility].  Progress Note    Assessment:   Admitted with chest pain   CKD IV   Severe hypertension    DM   Proteinuria   Anemia   3 V disease     Plan:     Continue with BP meds - will increase Coreg   CABG in future    D/w daughter    Queen Slough, MD  Office - [phone]  ++++++++++++++++++++++++++++++++++++++++++++++++++++++++++++++  Subjective:  No new complaints    Medications:  Scheduled Meds:  Current Facility-Administered Medications   Medication Dose Route Frequency   . aspirin EC  325 mg Oral Daily   . bisacodyl  10 mg Oral Daily   . carvedilol  25 mg Oral TID   . cloNIDine  0.2 mg Oral BID   . heparin (porcine)  5,000 Units Subcutaneous Q12H [facility]   . hydrALAZINE  100 mg Oral Q8H [place]   . hydroCHLOROthiazide  25 mg Oral Daily   . insulin lispro  1-3 Units Subcutaneous QHS   . insulin lispro  1-5 Units Subcutaneous TID AC   . losartan  100 mg Oral Daily   . NIFEdipine ER  60 mg Oral Daily   . nitroglycerin  0.5 inch Topical Q6H HOLD MN   . senna-docusate  2 tablet Oral QHS   . simvastatin  10 mg Oral QHS     Continuous Infusions:     PRN Meds:acetaminophen, Nursing communication: Adult Hypoglycemia Treatment Algorithm **AND** dextrose **AND** dextrose **AND** glucagon (rDNA) **AND** Nursing communication: Document Significant Event Note, hydrALAZINE, iodixanol, labetalol, morphine    Objective:  Vital signs in last 24 hours:  Temp:  [98.2 F (36.8 C)-99.1 F (37.3 C)] 99.1 F (37.3 C)  Heart Rate:  [73-97] 77  Resp Rate:  [16-18] 18  BP: (105-186)/(66-89) 138/69    Intake/Output from yesterday (07:01 - 07:00):  10/03 0701 - 10/04 0700  In: 0981 [P.O.:812; I.V.:900]  Out: 1200 [Urine:1200]     Physical Exam:   Gen: WD WN NAD   CV: S1 S2 N RRR   Chest: CTAB   Ab: ND NT soft no HSM +BS   Ext: No C/E    Labs:    Recent Labs  Lab 06/07/16  0632 06/06/16  0449 06/05/16  0552 06/04/16  0650  06/01/16  0351   Glucose 118* 94 113* 127* More results in Results Review  115*   BUN 18.7 22.1* 26.5* 25.8* More results in Results Review 32.0*   Creatinine 1.9* 1.8* 2.0* 2.0* More results in Results Review 2.4*   Calcium 8.2* 8.9 8.9 8.9 More results in Results Review 8.1*   Sodium 135* 137 136 136 More results in Results Review 136   Potassium 4.1 3.9 4.0 4.3 More results in Results Review 5.1   Chloride 110 111 108 108 More results in Results Review 111   CO2 16* 17* 18* 18* More results in Results Review 16*   Albumin  --   --   --  2.4*  --  2.1*   Magnesium  --   --   --  1.9  --  2.2   More results in Results Review = values in this interval not displayed.    Recent Labs  Lab 06/04/16  0650 06/03/16  0428 06/02/16  0421   WBC 6.13 5.37 5.61   Hgb 11.0* 10.2* 9.5*   Hematocrit 34.4* 31.8* 30.0*   MCV 69.8* 69.0* 69.6*  MCH 22.3* 22.1* 22.0*   MCHC 32.0 32.1 31.7*   RDW 18* 18* 18*   MPV 10.5 10.2 10.2   Platelets 258 226 219

## 2016-06-07 NOTE — Plan of Care (Signed)
Problem: Hemodynamic Status: Cardiac  Goal: Stable vital signs and fluid balance  Outcome: Progressing   06/07/16 1609   Goal/Interventions addressed this shift   Stable vital signs and fluid balance Monitor/assess vital signs and telemetry per unit protocol;Assess signs and symptoms associated with cardiac rhythm changes;Monitor lab values;Monitor for leg swelling/edema and report to LIP if abnormal     VSS. Tele NSR. Denies CP or SOB. BP better. OOB to BR with minimal assist with family. Blood sugars WNL. Right groin CDI, soft no s/s of hematoma.

## 2016-06-07 NOTE — Progress Notes (Signed)
Nutritional Support Services  Nutrition Screening    Jacqueline Weber 65 y.o. female                                 MRN: [id number]    Referral Source: Per policy  Reason for Referral: LOS    Orders Placed This Encounter   Procedures   . Diet cardiac Patient preferences: renal and carb consistent diet too      PO intake: Good, daughter reports pt has eaten >75% of meals. No recent appetite or wt changes at home. States pt follows a renal/diabetic/cardiac diet at home.     Chart review completed.  Labs and meds noted.  No nutrition diagnosis or intervention at this time.  Please consult dietitian if needed.    Lonni Fix, RD

## 2016-06-07 NOTE — Progress Notes (Signed)
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 baseline   Type II MI in setting of hypertensive crisis last week, TnI went up to 8.03 - cath deferred due to renal insufficiency   Abnormal OPnuclear stress test findings, February 2017, nuclear stress test, small-sized, mild intensity inferoapical ischemia, EF 72%.   February 2017, echo, EF 70%, concentric LVH, grade I diastolic dysfunction,    mild AI.    Recommendations:    Continue Coreg 25 mg TID, clonidine 0.2 mg BID, hydralazine 100 mg TID, HCTZ 25 mg daily, Cozaar 100 mg daily,  nitroglycerin patch, Procardia 60 mg daily. Blood pressures have improved significantly.   Acute renal injury on chronic kidney disease (BUN 18.7, Cr 1.9)   Patient will be transferred to [facility] for a CABG on 06/08/16   Groin is tender, but soft, doesn't appear to have a PSA.  Will check again in AM, will hold off on groin [place] for now.        Medications:      Scheduled Meds: PRN Meds:      aspirin EC 325 mg Oral Daily   bisacodyl 10 mg Oral Daily   carvedilol 25 mg Oral TID   cloNIDine 0.2 mg Oral BID   heparin (porcine) 5,000 Units Subcutaneous Q12H [place]   hydrALAZINE 100 mg Oral Q8H SCH   hydroCHLOROthiazide 25 mg Oral Daily   insulin lispro 1-3 Units Subcutaneous QHS   insulin lispro 1-5 Units Subcutaneous TID AC   losartan 100 mg Oral Daily   NIFEdipine ER 60 mg Oral Daily   nitroglycerin 0.5 inch  Topical Q6H HOLD MN   senna-docusate 2 tablet Oral QHS   simvastatin 10 mg Oral QHS       Continuous Infusions:      acetaminophen 650 mg Q4H PRN   dextrose 15 g of glucose PRN   And     dextrose 125 mL PRN   And     glucagon (rDNA) 1 mg PRN   hydrALAZINE 10 mg Q6H PRN   iodixanol 90 mL ONCE PRN   labetalol 10 mg Q6H PRN   morphine 2 mg Q4H PRN             Subjective:     Jacqueline Weber is doing well today. Her pressures have improved, and most recent reading was 138/69. She reports mild SOB with laying down, but otherwise denies CP, lightheadedness, palpitations, or headaches.     Some tenderness to the catheter insertion site      Physical Exam:     Vitals:    06/07/16 0929   BP: 138/69   Pulse: 77   Resp: 18   Temp: 99.1 F (37.3 C)   SpO2: 97%     Temp (24hrs), Avg:98.5 F (  36.9 C), Min:98.2 F (36.8 C), Max:99.1 F (37.3 C)    Weight Monitoring 05/31/2016 06/02/2016 06/03/2016 06/03/2016 06/05/2016 06/06/2016 06/07/2016   Height - - - 152.4 cm - - -   Height Method - - - - - - -   Weight 64.4 kg 66.1 kg 64 kg 64.003 kg 66.18 kg 65.137 kg 65.681 kg   Weight Method Bed Scale Bed Scale Bed Scale - Bed Scale Bed Scale Bed Scale   BMI (calculated) - - - 27.6 kg/m2 - - -          Telemetry reviewed no changes.  SR     Intake and Output Summary (Last 24 hours) at Date Time    Intake/Output Summary (Last 24 hours) at 06/07/16 1010  Last data filed at 06/06/16 2200   Gross per 24 hour   Intake             1712 ml   Output             1200 ml   Net              512 ml     General Appearance:  Breathing comfortable, no acute distress  Neck:  No carotid bruit or jugular venous distension, brisk carotid upstroke  Lungs:  Clear to auscultation throughout, no wheezes, rhonchi or rales, good respiratory effort   Heart:  S1, S2 normal, no S3, no S4, no murmur, PMI not displaced, no rub   Abdomen:  Soft, non-tender, positive bowel sounds, no hepatojugular reflux  Extremities:  No cyanosis, clubbing or edema, R groin site is soft  without evidence of hematoma or PSA  Pulses:  Equal radial pulses, 4/4 symmetric  Neurologic:  Alert and oriented x3, mood and affect normal    Labs:     Recent Labs  Lab 05/31/16  1409   Troponin I 0.03               Recent Labs  Lab 06/04/16  0650   Bilirubin, Total 0.3   Protein, Total 5.7*   Albumin 2.4*   ALT 10   AST (SGOT) 14       Recent Labs  Lab 06/04/16  0650   Magnesium 1.9       Recent Labs  Lab 06/05/16  0552   PT 12.9   PT INR 1.0   PTT 40*       Recent Labs  Lab 06/04/16  0650 06/03/16  0428 06/02/16  0421   WBC 6.13 5.37 5.61   Hgb 11.0* 10.2* 9.5*   Hematocrit 34.4* 31.8* 30.0*   Platelets 258 226 219       Recent Labs  Lab 06/07/16  0632 06/06/16  0449 06/05/16  0552   Sodium 135* 137 136   Potassium 4.1 3.9 4.0   Chloride 110 111 108   CO2 16* 17* 18*   BUN 18.7 22.1* 26.5*   Creatinine 1.9* 1.8* 2.0*   EGFR 26.6 28.3 25.1   Glucose 118* 94 113*   Calcium 8.2* 8.9 8.9           Invalid input(s): FREET4  Estimated Creatinine Clearance: 25.6 mL/min (based on SCr of 1.9 mg/dL).      .  Lab Results   Component Value Date    BNP 504.8 (H) 05/31/2016      Imaging:             Excell Seltzer, PA-S    Signed by: Mickel Baas  A Hayat [place], [facility]  NP [place] (8am-5pm)  MD Spectralink [phone] (8am-5pm)  After hours, non urgent consult line [phone]  After Hours, urgent consults [phone]

## 2016-06-08 ENCOUNTER — Inpatient Hospital Stay: Payer: Medicare Other

## 2016-06-08 ENCOUNTER — Inpatient Hospital Stay
Admission: RE | Admit: 2016-06-08 | Discharge: 2016-06-19 | DRG: 235 | Disposition: A | Discharge status: Home Health | Payer: Medicare Other | Source: Ambulatory Visit | Attending: Thoracic Surgery (Cardiothoracic Vascular Surgery) | Admitting: Thoracic Surgery (Cardiothoracic Vascular Surgery)

## 2016-06-08 ENCOUNTER — Inpatient Hospital Stay: Payer: Medicare Other | Admitting: Internal Medicine

## 2016-06-08 DIAGNOSIS — Z7982 Long term (current) use of aspirin: Secondary | ICD-10-CM

## 2016-06-08 DIAGNOSIS — I161 Hypertensive emergency: Secondary | ICD-10-CM

## 2016-06-08 DIAGNOSIS — N184 Chronic kidney disease, stage 4 (severe): Secondary | ICD-10-CM | POA: Diagnosis present

## 2016-06-08 DIAGNOSIS — N170 Acute kidney failure with tubular necrosis: Secondary | ICD-10-CM | POA: Diagnosis present

## 2016-06-08 DIAGNOSIS — D509 Iron deficiency anemia, unspecified: Secondary | ICD-10-CM | POA: Diagnosis present

## 2016-06-08 DIAGNOSIS — E871 Hypo-osmolality and hyponatremia: Secondary | ICD-10-CM | POA: Diagnosis not present

## 2016-06-08 DIAGNOSIS — I10 Essential (primary) hypertension: Secondary | ICD-10-CM

## 2016-06-08 DIAGNOSIS — Z888 Allergy status to other drugs, medicaments and biological substances status: Secondary | ICD-10-CM

## 2016-06-08 DIAGNOSIS — I2582 Chronic total occlusion of coronary artery: Secondary | ICD-10-CM | POA: Diagnosis present

## 2016-06-08 DIAGNOSIS — E785 Hyperlipidemia, unspecified: Secondary | ICD-10-CM | POA: Diagnosis present

## 2016-06-08 DIAGNOSIS — E119 Type 2 diabetes mellitus without complications: Secondary | ICD-10-CM

## 2016-06-08 DIAGNOSIS — E872 Acidosis: Secondary | ICD-10-CM | POA: Diagnosis not present

## 2016-06-08 DIAGNOSIS — I214 Non-ST elevation (NSTEMI) myocardial infarction: Principal | ICD-10-CM | POA: Diagnosis present

## 2016-06-08 DIAGNOSIS — I2584 Coronary atherosclerosis due to calcified coronary lesion: Secondary | ICD-10-CM | POA: Diagnosis present

## 2016-06-08 DIAGNOSIS — R079 Chest pain, unspecified: Secondary | ICD-10-CM

## 2016-06-08 DIAGNOSIS — I130 Hypertensive heart and chronic kidney disease with heart failure and stage 1 through stage 4 chronic kidney disease, or unspecified chronic kidney disease: Secondary | ICD-10-CM | POA: Diagnosis present

## 2016-06-08 DIAGNOSIS — K5900 Constipation, unspecified: Secondary | ICD-10-CM | POA: Diagnosis present

## 2016-06-08 DIAGNOSIS — E7800 Pure hypercholesterolemia, unspecified: Secondary | ICD-10-CM | POA: Diagnosis present

## 2016-06-08 DIAGNOSIS — Z8673 Personal history of transient ischemic attack (TIA), and cerebral infarction without residual deficits: Secondary | ICD-10-CM

## 2016-06-08 DIAGNOSIS — Z880 Allergy status to penicillin: Secondary | ICD-10-CM

## 2016-06-08 DIAGNOSIS — R7989 Other specified abnormal findings of blood chemistry: Secondary | ICD-10-CM

## 2016-06-08 DIAGNOSIS — D638 Anemia in other chronic diseases classified elsewhere: Secondary | ICD-10-CM | POA: Diagnosis present

## 2016-06-08 DIAGNOSIS — E1122 Type 2 diabetes mellitus with diabetic chronic kidney disease: Secondary | ICD-10-CM | POA: Diagnosis present

## 2016-06-08 DIAGNOSIS — I2510 Atherosclerotic heart disease of native coronary artery without angina pectoris: Secondary | ICD-10-CM | POA: Diagnosis present

## 2016-06-08 DIAGNOSIS — Z8249 Family history of ischemic heart disease and other diseases of the circulatory system: Secondary | ICD-10-CM

## 2016-06-08 DIAGNOSIS — I252 Old myocardial infarction: Secondary | ICD-10-CM | POA: Insufficient documentation

## 2016-06-08 DIAGNOSIS — Z794 Long term (current) use of insulin: Secondary | ICD-10-CM

## 2016-06-08 DIAGNOSIS — N289 Disorder of kidney and ureter, unspecified: Secondary | ICD-10-CM

## 2016-06-08 DIAGNOSIS — Z91013 Allergy to seafood: Secondary | ICD-10-CM

## 2016-06-08 DIAGNOSIS — E1165 Type 2 diabetes mellitus with hyperglycemia: Secondary | ICD-10-CM | POA: Diagnosis present

## 2016-06-08 DIAGNOSIS — I5032 Chronic diastolic (congestive) heart failure: Secondary | ICD-10-CM | POA: Diagnosis present

## 2016-06-08 LAB — TROPONIN I: Troponin I: 0.03 ng/mL (ref 0.00–0.09)

## 2016-06-08 LAB — GFR: EGFR: 25.1

## 2016-06-08 LAB — BASIC METABOLIC PANEL
Anion Gap: 8 (ref 5.0–15.0)
BUN: 19.8 mg/dL — ABNORMAL HIGH (ref 7.0–19.0)
CO2: 16 mEq/L — ABNORMAL LOW (ref 22–29)
Calcium: 8.2 mg/dL — ABNORMAL LOW (ref 8.5–10.5)
Chloride: 106 mEq/L (ref 100–111)
Creatinine: 2 mg/dL — ABNORMAL HIGH (ref 0.6–1.0)
Glucose: 118 mg/dL — ABNORMAL HIGH (ref 70–100)
Potassium: 3.9 mEq/L (ref 3.5–5.1)
Sodium: 130 mEq/L — ABNORMAL LOW (ref 136–145)

## 2016-06-08 LAB — GLUCOSE WHOLE BLOOD - POCT
Whole Blood Glucose POCT: 124 mg/dL — ABNORMAL HIGH (ref 70–100)
Whole Blood Glucose POCT: 181 mg/dL — ABNORMAL HIGH (ref 70–100)
Whole Blood Glucose POCT: 174 mg/dL — ABNORMAL HIGH (ref 70–100)

## 2016-06-08 MED ORDER — HYDRALAZINE HCL 25 MG PO TABS
100.0000 mg | ORAL_TABLET | Freq: Two times a day (BID) | ORAL | Status: DC
Start: 2016-06-08 — End: 2016-06-09
  Administered 2016-06-08 – 2016-06-09 (×2): 100 mg via ORAL
  Filled 2016-06-08 (×2): qty 2

## 2016-06-08 MED ORDER — ONDANSETRON HCL 4 MG/2ML IJ SOLN
4.0000 mg | Freq: Every day | INTRAMUSCULAR | Status: DC | PRN
Start: 2016-06-08 — End: 2016-06-09

## 2016-06-08 MED ORDER — INSULIN LISPRO 100 UNIT/ML SC SOLN
1.0000 [IU] | Freq: Three times a day (TID) | SUBCUTANEOUS | Status: DC | PRN
Start: 2016-06-08 — End: 2016-06-13
  Administered 2016-06-11: 1 [IU] via SUBCUTANEOUS
  Filled 2016-06-08: qty 3

## 2016-06-08 MED ORDER — NALOXONE HCL 0.4 MG/ML IJ SOLN (WRAP)
0.2000 mg | INTRAMUSCULAR | Status: DC | PRN
Start: 2016-06-08 — End: 2016-06-13

## 2016-06-08 MED ORDER — FAMOTIDINE 20 MG PO TABS
20.0000 mg | ORAL_TABLET | Freq: Every day | ORAL | Status: DC
Start: 2016-06-08 — End: 2016-06-12
  Administered 2016-06-08 – 2016-06-11 (×4): 20 mg via ORAL
  Filled 2016-06-08 (×4): qty 1

## 2016-06-08 MED ORDER — FAMOTIDINE 20 MG PO TABS
20.0000 mg | ORAL_TABLET | Freq: Every day | ORAL | Status: DC
Start: 2016-06-08 — End: 2016-06-08
  Administered 2016-06-08: 20 mg via ORAL
  Filled 2016-06-08: qty 1

## 2016-06-08 MED ORDER — ACETAMINOPHEN 325 MG PO TABS
650.0000 mg | ORAL_TABLET | ORAL | Status: DC | PRN
Start: 2016-06-08 — End: 2016-06-12

## 2016-06-08 MED ORDER — HEPARIN SODIUM (PORCINE) 5000 UNIT/ML IJ SOLN
5000.0000 [IU] | Freq: Three times a day (TID) | INTRAMUSCULAR | Status: DC
Start: 2016-06-08 — End: 2016-06-11
  Administered 2016-06-08 – 2016-06-11 (×9): 5000 [IU] via SUBCUTANEOUS
  Filled 2016-06-08 (×9): qty 1

## 2016-06-08 MED ORDER — TRAMADOL HCL 50 MG PO TABS
50.0000 mg | ORAL_TABLET | ORAL | Status: DC | PRN
Start: 2016-06-08 — End: 2016-06-12

## 2016-06-08 MED ORDER — INSULIN LISPRO 100 UNIT/ML SC SOLN
1.0000 [IU] | Freq: Every evening | SUBCUTANEOUS | Status: DC | PRN
Start: 2016-06-08 — End: 2016-06-13
  Administered 2016-06-08: 1 [IU] via SUBCUTANEOUS
  Filled 2016-06-08: qty 3

## 2016-06-08 MED ORDER — AMLODIPINE BESYLATE 5 MG PO TABS
10.0000 mg | ORAL_TABLET | Freq: Every day | ORAL | Status: DC
Start: 2016-06-09 — End: 2016-06-09
  Administered 2016-06-09: 10 mg via ORAL
  Filled 2016-06-08: qty 2

## 2016-06-08 MED ORDER — AMLODIPINE BESYLATE 5 MG PO TABS
10.0000 mg | ORAL_TABLET | Freq: Every day | ORAL | Status: DC
Start: 2016-06-08 — End: 2016-06-08

## 2016-06-08 MED ORDER — CARVEDILOL 6.25 MG PO TABS
25.0000 mg | ORAL_TABLET | Freq: Two times a day (BID) | ORAL | Status: DC
Start: 2016-06-08 — End: 2016-06-09
  Administered 2016-06-08 – 2016-06-09 (×2): 25 mg via ORAL
  Filled 2016-06-08 (×2): qty 1

## 2016-06-08 MED ORDER — POLYETHYLENE GLYCOL 3350 17 G PO PACK
17.0000 g | PACK | Freq: Every day | ORAL | Status: DC
Start: 2016-06-08 — End: 2016-06-13
  Administered 2016-06-08 – 2016-06-10 (×2): 17 g via ORAL
  Filled 2016-06-08 (×4): qty 1

## 2016-06-08 MED ORDER — ASPIRIN 325 MG PO TBEC
325.0000 mg | DELAYED_RELEASE_TABLET | Freq: Every day | ORAL | Status: DC
Start: 2016-06-09 — End: 2016-06-12
  Administered 2016-06-09 – 2016-06-11 (×3): 325 mg via ORAL
  Filled 2016-06-08 (×3): qty 1

## 2016-06-08 MED ORDER — ASPIRIN 325 MG PO TBEC
325.0000 mg | DELAYED_RELEASE_TABLET | Freq: Every day | ORAL | Status: DC
Start: 2016-06-09 — End: 2016-06-08

## 2016-06-08 MED ORDER — CLONIDINE HCL 0.1 MG PO TABS
0.1000 mg | ORAL_TABLET | Freq: Two times a day (BID) | ORAL | Status: DC
Start: 2016-06-08 — End: 2016-06-11
  Administered 2016-06-08 – 2016-06-11 (×6): 0.1 mg via ORAL
  Filled 2016-06-08 (×6): qty 1

## 2016-06-08 MED ORDER — GLUCAGON 1 MG IJ SOLR (WRAP)
1.0000 mg | INTRAMUSCULAR | Status: DC | PRN
Start: 2016-06-08 — End: 2016-06-13

## 2016-06-08 MED ORDER — ISOSORBIDE MONONITRATE ER 30 MG PO TB24
30.0000 mg | ORAL_TABLET | Freq: Every day | ORAL | Status: DC
Start: 2016-06-08 — End: 2016-06-08
  Administered 2016-06-08: 30 mg via ORAL
  Filled 2016-06-08: qty 1

## 2016-06-08 MED ORDER — LOSARTAN POTASSIUM 25 MG PO TABS
25.0000 mg | ORAL_TABLET | Freq: Every day | ORAL | Status: DC
Start: 2016-06-08 — End: 2016-06-08

## 2016-06-08 MED ORDER — ATORVASTATIN CALCIUM 20 MG PO TABS
40.0000 mg | ORAL_TABLET | Freq: Every evening | ORAL | Status: DC
Start: 2016-06-08 — End: 2016-06-08

## 2016-06-08 MED ORDER — SITAGLIPTIN PHOSPHATE 25 MG PO TABS
25.0000 mg | ORAL_TABLET | Freq: Every day | ORAL | Status: DC
Start: 2016-06-08 — End: 2016-06-12
  Administered 2016-06-08 – 2016-06-11 (×4): 25 mg via ORAL
  Filled 2016-06-08 (×5): qty 1

## 2016-06-08 MED ORDER — DEXTROSE 10 % IV BOLUS
125.0000 mL | INTRAVENOUS | Status: DC | PRN
Start: 2016-06-08 — End: 2016-06-13

## 2016-06-08 MED ORDER — OXYCODONE-ACETAMINOPHEN 5-325 MG PO TABS
1.0000 | ORAL_TABLET | ORAL | Status: DC | PRN
Start: 2016-06-08 — End: 2016-06-12

## 2016-06-08 MED ORDER — GLUCOSE 40 % PO GEL
15.0000 g | ORAL | Status: DC | PRN
Start: 2016-06-08 — End: 2016-06-13

## 2016-06-08 MED ORDER — LOSARTAN POTASSIUM 25 MG PO TABS
25.0000 mg | ORAL_TABLET | Freq: Every day | ORAL | Status: DC
Start: 2016-06-09 — End: 2016-06-09

## 2016-06-08 MED ORDER — SENNOSIDES-DOCUSATE SODIUM 8.6-50 MG PO TABS
1.0000 | ORAL_TABLET | Freq: Two times a day (BID) | ORAL | Status: DC
Start: 2016-06-08 — End: 2016-06-12
  Administered 2016-06-08 – 2016-06-10 (×5): 1 via ORAL
  Filled 2016-06-08 (×7): qty 1

## 2016-06-08 NOTE — Progress Notes (Signed)
[facility].  Progress Note    Assessment:   Admitted with chest pain   CKD IV baseline Cr 1 in (09/16/15) since 05/2016 Cr 2, RK 8.8, L K 10.4   Severe hypertension    DM   Proteinuria 8 gms, no hematuria, urine IFE no MSpike, albumin 2.1 and it was 3.1 (09/30/15)   Anemia   3 V disease      PLAN:   Continue with BP meds    Proteinuria of 8 gms. Will repeat another UP/C her serum albumin is low. No hematuria on UA. Her serum albumin is also declining. She may need kidney biopsy to r/o any GN pathology. ( membranous, FSGS,)   To be transferred to [facility] for CABG        Dayna Barker, MD  Office [phone]  ++++++++++++++++++++++++++++++++++++++++++++++++++++++++++++++  Subjective:  No new complaints    Medications:  Scheduled Meds:  Current Facility-Administered Medications   Medication Dose Route Frequency   . aspirin EC  325 mg Oral Daily   . atorvastatin  40 mg Oral QHS   . bisacodyl  10 mg Oral Daily   . carvedilol  25 mg Oral TID   . cloNIDine  0.2 mg Oral BID   . heparin (porcine)  5,000 Units Subcutaneous Q12H [facility]   . hydrALAZINE  100 mg Oral Q8H [place]   . hydroCHLOROthiazide  25 mg Oral Daily   . insulin lispro  1-3 Units Subcutaneous QHS   . insulin lispro  1-5 Units Subcutaneous TID AC   . isosorbide mononitrate  30 mg Oral Daily   . losartan  100 mg Oral Daily   . NIFEdipine ER  60 mg Oral Daily   . senna-docusate  2 tablet Oral QHS     Continuous Infusions:     PRN Meds:acetaminophen, Nursing communication: Adult Hypoglycemia Treatment Algorithm **AND** dextrose **AND** dextrose **AND** glucagon (rDNA) **AND** Nursing communication: Document Significant Event Note, hydrALAZINE, iodixanol, labetalol, morphine    Objective:  Vital signs in last 24 hours:  Temp:  [97.3 F (36.3 C)-98.6 F (37 C)] 97.3 F (36.3 C)  Heart Rate:  [68-77] 71  Resp Rate:  [16-18] 18  BP: (106-142)/(60-78) 129/78    Intake/Output from yesterday (07:01 - 07:00):  No intake/output data  recorded.     Physical Exam:   Gen: WD WN NAD   CV: S1 S2 N RRR   Chest: CTAB   Ab: ND NT soft no HSM +BS   Ext: No C/E    Labs:    Recent Labs  Lab 06/08/16  0526 06/07/16  0632 06/06/16  0449  06/04/16  0650   Glucose 118* 118* 94 More results in Results Review 127*   BUN 19.8* 18.7 22.1* More results in Results Review 25.8*   Creatinine 2.0* 1.9* 1.8* More results in Results Review 2.0*   Calcium 8.2* 8.2* 8.9 More results in Results Review 8.9   Sodium 130* 135* 137 More results in Results Review 136   Potassium 3.9 4.1 3.9 More results in Results Review 4.3   Chloride 106 110 111 More results in Results Review 108   CO2 16* 16* 17* More results in Results Review 18*   Albumin  --   --   --   --  2.4*   Magnesium  --   --   --   --  1.9   More results in Results Review = values in this interval not displayed.  Recent Labs  Lab 06/04/16  0650 06/03/16  0428 06/02/16  0421   WBC 6.13 5.37 5.61   Hgb 11.0* 10.2* 9.5*   Hematocrit 34.4* 31.8* 30.0*   MCV 69.8* 69.0* 69.6*   MCH 22.3* 22.1* 22.0*   MCHC 32.0 32.1 31.7*   RDW 18* 18* 18*   MPV 10.5 10.2 10.2   Platelets 258 226 219

## 2016-06-08 NOTE — Progress Notes (Addendum)
[place]    Date Time: 06/08/16 10:12 AM  Patient Name: Jacqueline Weber, Jacqueline Weber           Assessment:      LHC on 06/06/16 showing severe 3-vessel disease  ? EF 70%  ? 90% mid-PDA lesion, 60% RPL lesion,   ? 20% left main lesion, 90% proximal LAD, 50-60% mid LAD, and 80% in diagonal  ? 90% LCA lesion   Recurrent hypertensive crisis with chest pain, BP improved  ? No renal artery stenosis by cath on 06/06/16   Widened mediastinum in CXR - TEE neg for dissection   AKI on CKD 4Scr up to 2.6, slowly improving 2.0 today    Type II MI in setting of hypertensive crisis last week, TnI went up to 8.03 - cath deferred due to renal insufficiency   Abnormal OPnuclear stress test findings, February 2017, nuclear stress test, small-sized, mild intensity inferoapical ischemia, EF 72%.   February 2017, echo, EF 70%, concentric LVH, grade I diastolic dysfunction,    mild AI    Recommendations:    For transfer to [facility] today for eventual CABG   BP much improved, 130-140/60's, maintain current regimen of coreg 25 TID, catapress 0.2 BID, hydralazine 100 q 8 hrs, HCTZ 25, cozaar 100, and procardia 60 daily   Change NTG paste to imdur 30   Stop zocor and change to lipitor 40   R groin is equisityly tender, no hematoma or evidence of PSA on exam, however, in light of upcoming CABG, will check limited groin duplex.  If not done prior to d/c from [facility], can be done at [facility]       Medications:      Scheduled Meds: PRN Meds:      aspirin EC 325 mg Oral Daily   bisacodyl 10 mg Oral Daily   carvedilol 25 mg Oral TID   cloNIDine 0.2 mg Oral BID   heparin (porcine) 5,000 Units Subcutaneous Q12H [place]   hydrALAZINE 100 mg Oral Q8H SCH   hydroCHLOROthiazide 25 mg Oral Daily   insulin lispro 1-3 Units Subcutaneous QHS   insulin lispro 1-5 Units Subcutaneous TID AC   losartan 100 mg Oral Daily   NIFEdipine ER 60 mg Oral Daily   nitroglycerin 0.5 inch Topical Q6H HOLD [place]   senna-docusate 2 tablet Oral  QHS   simvastatin 10 mg Oral QHS       Continuous Infusions:      acetaminophen 650 mg Q4H PRN   dextrose 15 g of glucose PRN   And     dextrose 125 mL PRN   And     glucagon (rDNA) 1 mg PRN   hydrALAZINE 10 mg Q6H PRN   iodixanol 90 mL ONCE PRN   labetalol 10 mg Q6H PRN   morphine 2 mg Q4H PRN             Subjective:   Denies chest pain, SOB or palpitations.      Physical Exam:     Vitals:    06/08/16 0900   BP: 142/65   Pulse: 77   Resp: 17   Temp: 97.6 F (36.4 C)   SpO2: 96%     Temp (24hrs), Avg:97.9 F (36.6 C), Min:97.6 F (36.4 C), Max:98.6 F (37 C)    Weight Monitoring 05/31/2016 06/02/2016 06/03/2016 06/03/2016 06/05/2016 06/06/2016 06/07/2016   Height - - - 152.4 cm - - -   Height Method - - - - - - -  Weight 64.4 kg 66.1 kg 64 kg 64.003 kg 66.18 kg 65.137 kg 65.681 kg   Weight Method Bed Scale Bed Scale Bed Scale - Bed Scale Bed Scale Bed Scale   BMI (calculated) - - - 27.6 kg/m2 - - -          Telemetry reviewed no changes.  SR     Intake and Output Summary (Last 24 hours) at Date Time  No intake or output data in the 24 hours ending 06/08/16 1012    General Appearance:  Breathing comfortable, no acute distress  Neck:  No carotid bruit or jugular venous distension, brisk carotid upstroke  Lungs:  Clear to auscultation throughout, no wheezes, rhonchi or rales, good respiratory effort   Heart:  S1, S2 normal, no S3, no S4, no murmur, PMI not displaced, no rub   Abdomen:  Soft, non-tender, positive bowel sounds, no hepatojugular reflux  Extremities:  No cyanosis, clubbing or edema, R groin is soft, but very tender  Pulses:  Equal radial pulses, 4/4 symmetric  Neurologic:  Alert and oriented x3, mood and affect normal    Labs:     Recent Labs  Lab 06/04/16  0650   Bilirubin, Total 0.3   Protein, Total 5.7*   Albumin 2.4*   ALT 10   AST (SGOT) 14       Recent Labs  Lab 06/04/16  0650   Magnesium 1.9       Recent Labs  Lab 06/05/16  0552   PT 12.9   PT INR 1.0   PTT 40*       Recent Labs  Lab 06/04/16  0650  06/03/16  0428 06/02/16  0421   WBC 6.13 5.37 5.61   Hgb 11.0* 10.2* 9.5*   Hematocrit 34.4* 31.8* 30.0*   Platelets 258 226 219       Recent Labs  Lab 06/08/16  0526 06/07/16  0632 06/06/16  0449   Sodium 130* 135* 137   Potassium 3.9 4.1 3.9   Chloride 106 110 111   CO2 16* 16* 17*   BUN 19.8* 18.7 22.1*   Creatinine 2.0* 1.9* 1.8*   EGFR 25.1 26.6 28.3   Glucose 118* 118* 94   Calcium 8.2* 8.2* 8.9     Estimated Creatinine Clearance: 24.4 mL/min (based on SCr of 2 mg/dL).      Lab Results   Component Value Date    BNP 504.8 (H) 05/31/2016      Imaging:           Signed by: Lorelee Cover, PA    Patient seen and examined, my assessment and plans as noted above.    Signed by    Aleen Campi, MD      [facility]  NP [place] (8am-5pm)  MD Spectralink [phone] (8am-5pm)  After hours, non urgent consult line [phone]  After Hours, urgent consults [phone]

## 2016-06-08 NOTE — Plan of Care (Signed)
Problem: Hemodynamic Status: Cardiac  Goal: Stable vital signs and fluid balance  Outcome: Progressing   06/08/16 1259   Goal/Interventions addressed this shift   Stable vital signs and fluid balance Monitor/assess vital signs and telemetry per unit protocol;Assess signs and symptoms associated with cardiac rhythm changes;Monitor intake/output per unit protocol and/or LIP order;Monitor lab values;Monitor for leg swelling/edema and report to LIP if abnormal

## 2016-06-08 NOTE — Plan of Care (Addendum)
Problem: Safety  Goal: Patient will be free from injury during hospitalization  Outcome: Progressing  Safety precautions in place; call bell and bedside table within reach, fall mat down, patient educated and compliant w/ fall precautions, hourly rounding in progress.     Comments: Received patient in no acute distress. AOx4, independent. Telemetry box applied. Telemetry = NSR, HR 70's. SBP 140's. ORA. Laotian speaking; prefers family at bedside to translate. Translator refusal form signed & in chart; translator phone placed at bedside if needed. CHG bath given; gown changed. Patient & daughter oriented to unit, room, and white board explained. Safety & fall precautions maintained. Questions answered and needs addressed to satisfaction. CV Surg (attending team) called & made aware of pt's arrival. Orders placed. AC/HS checks. R groin c/d/i/soft/tender to touch; dressed w/ gauze & tegaderm.    Plan: CABG w/u.

## 2016-06-08 NOTE — H&P (Signed)
ADMISSION HISTORY AND PHYSICAL EXAM    Date Time: 06/08/16 6:58 PM  Patient Name: Jacqueline Weber  Attending Physician: Mardene Sayer, MD  Primary Care Physician: John Giovanni, MD    CC: CABG Evaluation    Assessment:     # NSTEMI s/p Heart cath showing MVD  # Acute on chronic renal disease - improving  # Hypertensive crisis - improved  # Anemia of chronic disease  # Type II Diabetes  # h/o CVA      Patient is a 65 years old female with h/o HTN, type II DM, and CKD admitted to [facility] with hypertensive crisis and chest pain. She had heart cath showing multivessel disease. She was sent to [facility] for CABG evaluation. She was also noted to have acute on chronic renal disease.     Plan:     - admit to inpatient w/ tele  - continue cardiac meds: ASA, statin, BB, and ARB.   - CABG workup per CT surgery. NPO after midnight  - BP stable now, continue current BP meds  - type II DM, continue home meds and start LDSS. Monitor BGs  - start Mirlax and Pericolace for constipation      Disposition: (Please see PAF column for Expected D/C Date)   Today's date: 06/08/2016  Admit Date: 06/08/2016  4:44 PM  Anticipated medical stability for discharge:Red - not tomorrow - estimated month/date: 06/11/16  Service status: Inpatient: risk of morbidity and mortality  Clinical Milestones:   Cardiac evaluation for CABG  Anticipated discharge needs: TBD    History of Presenting Illness:   Jacqueline Weber is a 65 y.o. female with h/o HTN, type II DM, CKD, and CVA transferred from [facility] for evaluation of CABG. Patient presented initially to [facility] with hypertensive crises and chest pain. On admission, patient noted to have acute on chronic renal failure, elevated BP, and ST depression. Cardiology consulted, patient had LHC that showed Multivessel disease. Medications were optimized and she was sent her for CABG evaluation.   Patient was seen in room. She feels well, denies any chest pain, abdominal pain,  SOB, N/V. + constipation    Past Medical History:     Past Medical History:   Diagnosis Date   . Cataracts, bilateral    . Cerebrovascular accident    . Chronic kidney disease    . CVA (cerebral infarction) 03/2013   . Diabetes mellitus    . Hypercholesteremia    . Hypertension    . Myocardial infarction        Available old records reviewed, including:  Epic    Past Surgical History:   No past surgical history on file.    Family History:   Heart disease  Hypertension    Social History:     History   Smoking Status   . Never Smoker   Smokeless Tobacco   . Never Used     History   Alcohol Use No     History   Drug Use No       Allergies:     Allergies   Allergen Reactions   . Mosquito (Culex Pipiens) Allergy Skin Test Shortness Of Breath     And  Trouble  breathing   . Lisinopril    . Shellfish-Derived Products Hives     New  allergy  New  allergy   . Penicillins Rash     Swelling,  numbness       Medications:  Home Medications             amLODIPine (NORVASC) 10 MG tablet     Take 1 tablet (10 mg total) by mouth daily.     aspirin EC 325 MG EC tablet     Take 1 tablet (325 mg total) by mouth daily.     carvedilol (COREG) 25 MG tablet     Take 25 mg by mouth 2 (two) times daily with meals.        cloNIDine (CATAPRES) 0.1 MG tablet     Take 0.1 mg by mouth 2 (two) times daily.     famotidine (PEPCID) 20 MG tablet     Take 1 tablet (20 mg total) by mouth daily.     hydrALAZINE (APRESOLINE) 100 MG tablet     Take 1 tablet (100 mg total) by mouth 2 (two) times daily.     losartan (COZAAR) 25 MG tablet     Take 1 tablet (25 mg total) by mouth daily.     simvastatin (ZOCOR) 10 MG tablet     Take 10 mg by mouth nightly.     SITagliptin (JANUVIA) 25 MG tablet     Take 1 tablet (25 mg total) by mouth daily.     vitamin D, ergocalciferol, (DRISDOL) 50000 UNIT Cap     Take 2,000 Units by mouth 2 (two) times daily.            Method by which medications were confirmed on admission: Hospital records    Review of Systems:   All  other systems were reviewed and are negative except: As per HPI    Physical Exam:   Patient Vitals for the past 24 hrs:   BP Temp Temp src Pulse Resp SpO2 Height Weight   06/08/16 1659 140/68 97.9 F (36.6 C) Oral 72 12 96 % 1.524 m (5') 65.4 kg (144 lb 1.6 oz)     Body mass index is 28.14 kg/m.  No intake or output data in the 24 hours ending 06/08/16 1858    General: awake, alert, oriented x 3; no acute distress.  HEENT: perrla, eomi, oropharynx clear without lesions, mucous membranes moist  Neck: supple, no lymphadenopathy, no thyromegaly  Cardiovascular: regular rate and rhythm, no murmurs, rubs or gallops  Lungs: clear to auscultation bilaterally, without wheezing, rhonchi, or rales  Abdomen: soft, non-tender, mild distension; normoactive bowel sounds  Extremities: no clubbing, cyanosis, or edema  Neuro: no foal deficits, no slurred speech  Skin: no rashes or lesions noted      Labs:     Results     ** No results found for the last 24 hours. **          Imaging personally reviewed, including: CXR, LHC    Safety Checklist  DVT prophylaxis:  CHEST guideline (See page e199S) Chemical and Mechanical   Foley:  [place] Rn Foley protocol Not present   IVs:  Peripheral IV   PT/OT: Ordered   Daily CBC & or Chem ordered:  SHM/ABIM guidelines (see #5) Yes, due to clinical and lab instability   Reference for approximate charges of common labs: CBC auto diff - $76  BMP - $99  Mg - $79    Signed by: Mardene Sayer, MD   cc:Lee, Gordy Clement, MD

## 2016-06-08 NOTE — Discharge Summary (Addendum)
[facility] HOSPITALIST   [place] Summary   Patient Info:   Date/Time: 06/08/2016 / 8:45 AM   Admit Date:05/31/2016  Patient Name:Jacqueline Weber   RUE:[id number]   PCP: John Giovanni, MD  Attending Physician:Iven Earnhart, Silva Bandy, MD     Hospital Course:   Please see H&P for complete details of HPI and ROS. The patient was admitted to [facility] and has been diagnosed with Hospital Problems:  Principal Problem:    Other chest pain  Active Problems:    Chest pain, unspecified type    Chest pain   and has been taken care as mentioned below.      1.  Hypertensive crisis  2.  Chest pain with recent non-STEMI in the setting of hypertensive crisis, last admission recently    3.  Elevated d-dimer, much improved due to hypertensive crisis  4.  Acute renal injury on chronic kidney disease, improving from 2.6 to 2  5.  Constipation for 1 week.  6.  History of glaucoma   7. Left kidney cysts 1x 0.9x0.9 cm  8. Hyponatremia  9. Non anion gap metabolic acidosis due to acute renal failure  10. Anemia  All of the above conditions, present on admission        This is a very pleasant 65 year old lady who speaks [place] with past medical history of cerebrovascular accident, diabetes mellitus, hypercholesterolemia, hypertension, glaucoma, chronic kidney disease, recent hospitalization to intensive care on May 10, 2016 for chest pain and nuclear stress test done earlier this year showing inferolateral ischemia.  Patient was scheduled for left heart cath on September 2014.  However, blood work revealed creatinine of 1.9 and cardiac catheterization was postponed pending nephrology evaluation.  Patient presented to the emergency department on May 16, 2016 with severely elevated blood pressure and ST depression in the lateral leads with peak troponin 8.  Patient was transferred to ICU for blood pressure control and patient was discharged home with medical management. Now admitted to [facility] on May 31, 2016 for shortness of breath and chest pain.  Patient had elevated d-dimer widening of the mediastinum on chest x-ray and initial concern for aortic dissection.  Patient creatinine on admission was 2.6.  CT angiogram scan is not possible to obtain due to renal function to rule out dissection.  Patient was then transferred to intensive care unit for blood pressure management.    1.  Hypertensive crisis.  Blood pressure improves SBP 130 to 150 mmHg  --- Continue Coreg 25 mg, clonidine 0.5 mg at bedtime, hydralazine 100 mg 3 times a day, hydrochlorothiazide 25 mg daily, Cozaar 100 mg daily,  nitroglycerin patch, Procardia 60 mg daily.    2.  Chest pain with recent non-STEMI in the setting of hypertensive crisis, last admission recently with peak troponin of 8.0  ---Troponins x 3 set normal this admission  --- Trans-esophageal echocardiogram in ICU at bedside setting om 05/31/2016  showed no evidence of dissection.  Mild aortic and mitral insufficiency.  Normal size aorta with no evidence of dissection of the ascending or descending sections.   Aortic arch visualized and very limited.  No clear pathology noted.  No left atrial clot.  No shunt.  Hyperdynamic LV with ejection fraction of 70 percent and left ventricular hypertrophy present.   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 Patient been accepted to have CABG on Thursday and will be transferred to [facility] .   ---  Carotid Doppler done on June 06, 2016 show mild to moderate bilateral carotid bulb calcified plaque without hemodynamically significant stenosis.    3.  Elevated d-dimer, much improved due to hypertensive crisis  --- VQ scan low probability on May 31, 2016    4.  Acute renal injury on chronic kidney disease, improving from 2.6-1.8  --Renal ultrasound on May 16, 2016 showed no hydronephrosis.  Asymmetric kidney size, smaller on the right side  --- Renal artery ultrasound duplex on May 18, 2016.  Last admission showed no evidence of significant renal artery stenosis.  Limited visualization of the mid left renal artery.  Elevated resistive indices bilaterally.  This is often indicated for medical renal disease.  --Left heart cath today did not show proximal renal artery stenosis.    5.  Constipation for 1 week.Resolved.  --- Continue MiraLAX, Dulcolax    6.  History of glaucoma.  Patient is due to follow-up with primary ophthalmologist Dr. Lance Sell, but patient is in the hospital.  At present, and need to reschedule  --- No pain or vision changes, new from a baseline  ----Discussed with colleague of Dr. Rosendo Gros, Dr. Donney Dice on the phone. No new recommendation at this time. To reschedule another appointment soon after discharged.     7. Left kidney cysts 1x 0.9x0.9 cm. This finding discussed with daughter Ms. [name] at bedside and recommend to follow up with Primary care physician to monitor for cancer.  Follow-up with Nephrology.     Disposition:    To be transferred to [facility]. No need to be NPO tonight per Cardiology.    Discussed with patient and daughter.                                                                       Disposition: [facility] hospital  Condition at Discharge and Prognosis: stable  Admission Date:05/31/2016  Discharge Date: 06/08/16  Type of Admission:Inpatient  Medical Necessity for stay: Hypertensive crisis, severe coronary artery disease  Code Status: Full Code  Subjective at the time of discharge:     Chief Complaint:  Chest Pain  Patient have episode of chest pain overnight and resolved at present.  No chest pain, shortness of breath, dizziness    Objective:     Vitals:    06/07/16 2136 06/07/16 2349 06/08/16 0215 06/08/16 0622   BP: 142/65 128/64 130/65 130/62   Pulse: 72 71 71 73   Resp: 16 17 17 17    Temp: 97.7 F (36.5 C) 98.1 F (36.7 C) 97.7 F (36.5 C) 98.6 F (37 C)   TempSrc:       SpO2: 96% 99% 98% 97%   Weight:        Height:         Physical Exam:   Physical Exam   Constitutional: No distress.   Eyes: No scleral icterus.   Cardiovascular: Normal rate, regular rhythm and normal heart sounds.    No murmur heard.  Pulmonary/Chest: Effort normal and breath sounds normal. No respiratory distress. She has no wheezes. She has no rales.   Abdominal: Soft. Bowel sounds are normal. She exhibits no distension. There is no tenderness. There is no rebound and no guarding.  Musculoskeletal: Normal range of motion. She exhibits no edema.   Neurological: She is alert. No cranial nerve deficit. Coordination normal.   Skin: Skin is warm. No erythema.   Psychiatric: Mood, memory, affect and judgment normal.     Clinical Presentation:   History of Presenting Illness: Please refer to HPI in the Detailed H&P  Discharge Diagnosis and Instructions:   Hospital Problems:Principal Problem:    Other chest pain  Active Problems:    Chest pain, unspecified type    Chest pain    Lists the present on admission hospital problems:Present on Admission:  . Chest pain, unspecified type  . Chest pain    Consultants:Dr. Gavin Pound, cardiology  Dr. Ellery Plunk nephrology    Procedure performed:   Left cardiac catheterization by Dr. Michel Harrow on June 06, 2016 showed severe three-vessel disease with normal LV function.  Severe hypertension without proximal renal artery stenosis.  Discharge Medications:   Discharge Medications:    Follow up recommendations:   Follow up:   Follow-up Information     John Giovanni, MD Follow up on 06/09/2016.    Specialty:  Internal Medicine  Why:  10:30am   Contact information:  [id number]  [place] [id number]  [phone]                  Results of Labs/imaging:   Labs have been reviewed:   Coagulation Profile:   Recent Labs  Lab 06/05/16  0552   PT 12.9   PT INR 1.0   PTT 40*       CBC review:   Recent Labs  Lab 06/04/16  0650 06/03/16  0428 06/02/16  0421   WBC 6.13 5.37 5.61   Hgb 11.0* 10.2* 9.5*   Hematocrit  34.4* 31.8* 30.0*   Platelets 258 226 219   MCV 69.8* 69.0* 69.6*   RDW 18* 18* 18*     Chem Review:  Recent Labs  Lab 06/08/16  0526 06/07/16  0632 06/06/16  0449 06/05/16  0552 06/04/16  0650   Sodium 130* 135* 137 136 136   Potassium 3.9 4.1 3.9 4.0 4.3   Chloride 106 110 111 108 108   CO2 16* 16* 17* 18* 18*   BUN 19.8* 18.7 22.1* 26.5* 25.8*   Creatinine 2.0* 1.9* 1.8* 2.0* 2.0*   Glucose 118* 118* 94 113* 127*   Calcium 8.2* 8.2* 8.9 8.9 8.9   Magnesium  --   --   --   --  1.9   Bilirubin, Total  --   --   --   --  0.3   AST (SGOT)  --   --   --   --  14   ALT  --   --   --   --  10   Alkaline Phosphatase  --   --   --   --  58     Results     Procedure Component Value Units Date/Time    Glucose Whole Blood - POCT [[id number]]  (Abnormal) Collected:  06/08/16 0700     Updated:  06/08/16 0729     POCT - Glucose Whole blood 124 (H) mg/dL     Basic Metabolic Panel [[id number]]  (Abnormal) Collected:  06/08/16 0526    Specimen:  Blood Updated:  06/08/16 0629     Glucose 118 (H) mg/dL      BUN 19.8 (H) mg/dL      Creatinine 2.0 (H)  mg/dL      Calcium 8.2 (L) mg/dL      Sodium 130 (L) mEq/L      Potassium 3.9 mEq/L      Chloride 106 mEq/L      CO2 16 (L) mEq/L      Anion Gap 8.0    GFR [[id number]] Collected:  06/08/16 0526     Updated:  06/08/16 0629     EGFR 25.1    Glucose Whole Blood - POCT [[id number]]  (Abnormal) Collected:  06/07/16 2139     Updated:  06/07/16 2224     POCT - Glucose Whole blood 155 (H) mg/dL     Glucose Whole Blood - POCT [[id number]]  (Abnormal) Collected:  06/07/16 1641     Updated:  06/07/16 1702     POCT - Glucose Whole blood 143 (H) mg/dL     Glucose Whole Blood - POCT [[id number]]  (Abnormal) Collected:  06/07/16 1139     Updated:  06/07/16 1148     POCT - Glucose Whole blood 138 (H) mg/dL         Radiology reports have been reviewed:  Radiology Results (24 Hour)     ** No results found for the last 24 hours. **        Echocardiogram Adult Transesophageal    Result Date:  05/31/2016  Transesophageal Echocardiogram Sonographer- DJ CLINICAL HISTORY- chest pain, severe hypertension, rule out aortic dissection Transesophageal echocardiogram:  2D, spectral Doppler and color flow imaging were performed and interpreted. PROCEDURE: Risks, benefits, and alternatives were discussed with the patient. Informed consent was obtained. All questions were answered. There were no contraindications. The patient was placed in the left lateral decubitus position. Sedation provided by anesthesia with IV propofol. The oropharynx was anesthetized with topical 14% benzocaine spray. The oropharynx was intubated without difficulty. There were no complications. Oxygen saturation levels, heart rate, and blood pressure were monitored throughout the procedure and were stable.     The left ventricle is normal in size. Left ventricular function is hyperdynamic. Regional wall motion abnormalities are not present. Estimated EF is 70%. The left atrium and left atrial appendage demonstrate no clot. The aortic valve is trileaflet, sclerotic.  There is mild aortic insufficiency. There is no visual aortic stenosis. The aortic root is grossly normal. The mitral valve is structurally intact. There is mild mitral insufficiency. The right ventricle is normal in size and normal in function. The right atrium demonstrates no clot. The tricuspid valve is structurally intact. There is mild tricuspid insufficiency. The pulmonic valve is structurally not well seen. There is trace pulmonic insufficiency. No pericardial effusion or intracardiac masses. The atrial septum is structurally intact. There is no shunt by color flow doppler. The descending thoracic aorta was visualized and appeared to have mild, nonmobile mild detectable atherosclerosis, but no thrombus, or spontaneous echo contrast. There is no evidence of a dissection or aneurysm formation in the ascending or descending aorta. Limited visualization of the aortic arch revealed  no clear aortic dissection. CONCLUSION: 1. No evidence of an aortic dissection 2. Dynamic left ventricular function with an EF of 70% 3. Adequately functioning cardiac valves Gavin Pound, MD 05/31/2016 3:26 PM     Echocardiogram Adult Complete W Clr/ Dopp Waveform    Result Date: 05/17/2016  ECHOCARDIOGRAM Sonographer:  Laurena Bering Durohom Indications:  Hypertension Height (in):  61 Weight (lb):  247 Blood Pressure:  146/62   2-D Measurements Left Ventricle  Diastolic Dimension:  42  (19-16 mm) Systolic Dimension:  30  (25-40 mm)     Septal Diastolic Thickness:  12  (6-11 mm)    Posterior Wall Thickness:  13  (6-11 mm) Fractional Shortening Percentage:  29  (24-46 %)                       Visually Estimated Ejection Fraction:  65  (55-75 %)                        Right Ventricle Diastolic Dimension:  22  (7-26 mm)                           Left Atrium End Systolic Dimension:  38  (19-40 mm)                    Aortic Root:  28  (20-37 mm)                       Doppler Measurements and Color Flow Imaging Valves                                        Aortic Valve:  1.9  (0.9-1.8 m/s).         Regurgitation:  Mild to moderate Pulmonic Valve:  1  (0.6-0.9 m/s).     Regurgitation:  0 Mitral Valve:  1  (0.6-1.4 m/s).          Regurgitation:  Mild Tricuspid Valve:  0.4  (0.4-0.8 m/s.     Regurgitation:  Trace Left Ventricular Outflow Tract Velocity:  1.1 m/s. E/A Ratio:  0.8 Est. PASP:  39 Est. RA Mean Pressure:  5 LA Volume 78 (18-58 ml) LA Volume Index 47 (16-28 ml/m2) TAPSE 24 (> or =16 mm) Echocardiogram M-mode, 2D, spectral Doppler and color flow imaging were performed and interpreted.     1.  The quality of the study is technically adequate for interpretation. 2.  The left ventricle is normal in size. Left ventricular systolic function is normal. There are no regional wall motion abnormalities. Estimated EF is 65%. There is mild concentric left ventricular hypertrophy. There is  evidence of abnormal diastolic LV function. 3.  The left atrium is moderately dilated in size. 4.  The aortic valve is trileaflet. Sclerotic aortic valve. Normal valve excursion is present. There is mild to moderate aortic insufficiency. No aortic stenosis is present. The aortic root is normal in size. 5.  The mitral valve is structurally normal. Mild mitral insufficiency is present. 6.  The right ventricle is normal in size and function. 7.  The right atrium is normal in size. 8. The tricuspid valve is structurally normal. There is no significant tricuspid insufficiency. 9. The pulmonic valve is structurally normal. There is no significant pulmonic insufficiency. 10. There is no evidence of pulmonary hypertension. 11. Trace pericardial effusion.. 12. The atrial septum is structurally normal. No shunt by color flow doppler. CONCLUSION: Left ventricle is normal in size with normal systolic function. LVEF 65%. Diastolic dysfunction noted. Mild concentric left ventricular hypertrophy. Left atrium moderately dilated. Sclerotic aortic valve. Mild to moderate aortic regurgitation. Mild mitral regurgitation. Trace pericardial effusion Darnelle Spangle, MD 05/17/2016 4:23 PM     Chest 2 Views    Result  Date: 05/31/2016  TECHNIQUE:  PA and lateral chest INDICATION: Chest pain COMPARISON: 05/17/2016 FINDINGS: Possible mild left basilar atelectasis. No consolidation, edema, pneumothorax or pleural effusions. Enlarged cardiopericardial silhouette, unchanged. Moderate degenerative changes of the visualized spine.     Possible mild left basilar atelectasis. Loyal Buba, MD 05/31/2016 2:14 AM     Ct Head Wo Contrast    Result Date: 06/01/2016  HISTORY: Facial droop. COMPARISON: Brain MRI 06/03/2014. TECHNIQUE: Axial noncontrast imaging through the head was performed. This CT study was performed using radiation dose reduction techniques including one or more of the following: automated exposure control, adjustment of the mA and/or kV  according to patient size, and the use of iterative reconstruction technique. FINDINGS: The ventricles are normal in appearance. There is minimal supratentorial chronic small vessel ischemic disease. No acute infarct is noted. No intracranial hemorrhage is seen. There is mild ethmoid sinus opacification/mucosal thickening. The middle ear cavities and mastoid air cells appear clear. Internal carotid and vertebral artery calcifications are visualized.      No acute intracranial abnormality is seen.     Marcos Eke, MD 06/01/2016 2:20 PM     Nm Pulmonary Vent/perf (vq Scan)    Result Date: 05/31/2016  CLINICAL HISTORY: Chest pain, shortness of breath, elevated d-dimer. FINDINGS: A ventilation scan was performed with 63.6 mCi of technetium 80m DTPA aerosol and a perfusion scan was performed with 7 mCi of Tc 2m MAA injected intravenously. Multiple matched ventilation/perfusion images of the lungs were obtained in different projections. Correlation is made with the chest radiograph(s) dated 05/31/2016. Images demonstrate mild heterogeneous tracer distribution within the lungs bilaterally on both perfusion and ventilation. No unmatched segmental or subsegmental perfusion defects are identified.      Low probability for pulmonary embolism. Ruby Cola, MD 05/31/2016 10:43 AM     US Renal Kidney    Result Date: 05/16/2016  HISTORY: Acute kidney injury. COMPARISON: None FINDINGS: Portable renal ultrasound was performed. Right kidney: 8.8 cm in length Left kidney: 10.4 cm in length Bladder prevoid volume: 304 cc Right kidney is smaller than the left but without significant cortical thinning. Normal, echotexture and position. There is no hydronephrosis, renal mass or stone. No bladder wall thickening. Simple left midpole cyst measures 1.0 x 0.9 x 0.9 cm.     1. No hydronephrosis. 2. Asymmetric kidney size smaller on the right.. 3. Left renal simple cyst. Ardelia Mems, MD 05/16/2016 5:58 PM     Xr Chest Ap  Portable    Result Date: 05/17/2016  CLINICAL HISTORY: respiratory distress COMPARISON: Chest x-ray dated 05/16/2016 AP view of the chest was performed. FINDINGS:    There remains mild pulmonary edema, predominantly right-sided, which appears mildly improved.  The heart size and bones are within normal limits.       Mild pulmonary edema, mildly improved. Maia Breslow, MD 05/17/2016 1:41 PM     Chest Ap Portable    Result Date: 05/16/2016  HISTORY: Shortness of breath Portable image of the chest shows enlarged cardiac pericardial silhouette. Vascular pattern is increased. Kerley B lines are present. Aortic contour is slightly tortuous. There is no focal consolidation, pleural effusion or pneumothorax.     1. Congestive failure. This is new when compared to 09/30/2015. Dierdre Searles, MD 05/16/2016 5:47 AM     US Carotid Duplex Dopp Comp Bilateral    Result Date: 06/06/2016  HISTORY: Preoperative evaluation prior to CABG. TECHNIQUE: The extracranial carotid arteries and proximal vertebral arteries  were evaluated with high resolution imaging, color Doppler, and spectral Doppler techniques. Findings are as follows: RIGHT CAROTID: Imaging demonstrates mild to moderate calcified plaque near the carotid bulb. Doppler is normal. LEFT CAROTID: Imaging demonstrates mild calcified plaque near the bulb. Doppler is normal. PROXIMAL VERTEBRAL ARTERIES: Patent. Antegrade flow. MEASURED VELOCITIES (CM/S): Right CCA:              87 Right proximal ICA (peak systolic): 981 Right proximal ICA (end diastolic): 26 Right ECA:               106 Left CCA:                 86 Left proximal ICA (peak systolic): 97 Left proximal ICA (end diastolic): 24 Left ECA:            140      Mild to moderate bilateral carotid bulb calcified plaque without hemodynamically significant stenosis. Ferd Hibbs, MD 06/06/2016 5:02 PM     US Renal Artery Duplx Dopp Complete    Result Date: 05/19/2016  CLINICAL HISTORY:  Hypertension. The renal vasculature was  evaluated with high resolution gray scale imaging, color Doppler, and spectral waveform analysis. Please note, this is slightly limited study due to poor visualization of the mid left renal artery. The highest detectable renal artery velocities translate into renal-aortic velocity ratios (RARs) of 2.0 on the right, and 1.4 on the left.  These are less than the 3.5 ratio used to predict the presence of pressure significant renal artery occlusive disease of 60% or more.  No high velocity flow jets or post-stenotic turbulence were identified.  Parenchymal resistive indices are elevated at 0.82 right, and 0.87 left, thus suggestive of increased renovascular resistance.  Both renal veins are patent.  The kidney sizes are within a normal range at 11.3 cm on the right, and 11.0 cm on the left. The aorta is nondilated and demonstrates no significant plaque formation.      1. No evidence of significant renal artery stenosis. 2. Limited visualization of the mid left renal artery. If the patient has persistent hypertension despite adequate medical renal treatment, further workup with MRA can be considered. 3. Elevated resistive indices bilaterally. This is often indicative of medical renal disease.  Pauline Aus, MD 05/19/2016 5:31 PM     Left Heart Cath Poss Pci    Result Date: 06/06/2016  PREOPERATIVE DIAGNOSIS: Coronary artery disease.  POSTOPERATIVE DIAGNOSIS:  Coronary artery disease. TITLE OF PROCEDURE: Left heart catheterization, coronary arteriography, left ventriculography, descending aortography.  COMPLICATIONS: None. HISTORY: Mrs. Puopolo is a 65 year old woman with multiple risk factors for coronary disease including insulin-dependent diabetes, hyperlipidemia, and severe hypertension.  She has a remote history of a CVA with recovery.  She has had chest pain in a crescendo pattern with positive troponin.  She has hypertension, which has been difficult to manage.  She has renal insufficiency.  In the hospital, her  creatinine has decreased from 2.6 to 1.8.  She has been "cleared" by the nephrologist to proceed with cardiac catheterization.  Because of a widened mediastinum, transesophageal echocardiogram was performed, which did not reveal evidence of aortic dissection.  She had normal left ventricular function with ejection fraction of 70%. DESCRIPTION OF PROCEDURE: After informed consent had been obtained, the patient was brought to the cardiac catheterization lab where the right groin was prepped and draped in the usual fashion.  She was sedated with Versed and fentanyl.  Under local anesthesia, a 6-French  sheath was placed in the right femoral artery using the Seldinger technique.  A pigtail catheter was advanced under fluoroscopic guidance across the aortic valve and utilized to measure left ventricular end-diastolic pressure.  A pullback of pressure was performed across the aortic valve.  Thereafter, a JL3.5 diagnostic catheter was utilized to obtain selective cineangiograms of the left coronary in several projections.  A JR4 catheter was utilized to obtain selective cineangiograms of the right coronary artery.  The right coronary catheter was then exchanged once again for the pigtail catheter, which was advanced to the mid abdomen and utilized for an abdominal aortogram in order to visualize the proximal renal arteries.  The pigtail catheter and sheath were removed and hemostasis obtained using the Angio-Seal technique.  The patient tolerated the procedure well and returned to her room in good condition. FINDINGS: HEMODYNAMICS: Left ventricular end-diastolic pressure is 10 to 15 mmHg.  There is no gradient across the aortic valve.  The patient has severe essential hypertension.  Despite sedation, her blood pressure in the laboratory was approximately 185/80. LEFT VENTRICLE: Not injected. ABDOMINAL AORTA:  The abdominal aorta is normal caliber.  There is no evidence of atherosclerotic disease of the proximal renal  arteries. RIGHT CORONARY: Large, dominant calcified vessel, which has 20% lesions in the mid and distal segment.  There is an 80% to 90% stenosis in the moderately large right posterior descending coronary artery.  There is a 60% lesion in the moderate-sized right posterolateral branch. LEFT CORONARY ARTERY: There is calcification of the left main trunk, LAD and circumflex.  The left main trunk is narrowed by 20% distally.  The left anterior descending has a 90% proximal stenosis with a long segment of 50% to 60% stenosis in the mid portion of the vessel.  There is an 80% stenosis of the moderate-sized first diagonal branch.  The circumflex has a 90% proximal stenosis above the origin of a large first marginal branch. FINAL DIAGNOSES: 1.  Severe 3-vessel coronary disease with known normal LV function,  (by echocardiogram).  2.  Recent non-Q myocardial infarction. 3.  Severe hypertension. 4.  Insulin-dependent diabetes. 5.  Hyperlipidemia. 6.  Remote cerebrovascular accident with complete recovery. 7.  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 PLAN: 1.  Renal function will be followed carefully.  It should be noted that 90 mL of contrast material was utilized during the procedure.  2.  Her films will be reviewed by the cardiac surgeons for possible coronary artery bypass graft surgery. 3.  Blood pressure medication adjustment per nephrology. 4.  The patient will follow up in our office after hospital discharge.  She will also follow up with the nephrologist and with her primary physician.    Pathology:   Specimens     None        Pending Lab Results:   Labs/Images to be followed at your PCP office: Unresulted Labs     None         Hospitalist:   Signed by: [place]  06/08/2016 8:45 AM  Time spent for discharge: 37 minutes      *This note was generated by the Epic EMR system/ Dragon speech recognition and may contain inherent errors or omissions not intended by the user. Grammatical errors,  random word insertions, deletions, pronoun errors and incomplete sentences are occasional consequences of this technology due to software limitations. Not all errors are caught or corrected. If there are questions or concerns about the content of this  note or information contained within the body of this dictation they should be addressed directly with the author for clarification

## 2016-06-08 NOTE — Progress Notes (Signed)
VSS. Tele NST. Bed available at [facility] hospital. Report called to [place] at [place]. Pt unable to void for urine sample prior to transport. Pt transported by PTS via stretcher accompanied by daughter at 1550.

## 2016-06-08 NOTE — Progress Notes (Signed)
Called [place] at 11:27 to inquire bed- still waiting for room number per [facility].

## 2016-06-09 ENCOUNTER — Other Ambulatory Visit: Payer: Medicare Other

## 2016-06-09 ENCOUNTER — Inpatient Hospital Stay: Payer: Medicare Other

## 2016-06-09 ENCOUNTER — Ambulatory Visit (INDEPENDENT_AMBULATORY_CARE_PROVIDER_SITE_OTHER): Payer: Medicare Other | Admitting: Internal Medicine

## 2016-06-09 DIAGNOSIS — R079 Chest pain, unspecified: Secondary | ICD-10-CM

## 2016-06-09 DIAGNOSIS — I161 Hypertensive emergency: Secondary | ICD-10-CM

## 2016-06-09 DIAGNOSIS — R7989 Other specified abnormal findings of blood chemistry: Secondary | ICD-10-CM

## 2016-06-09 DIAGNOSIS — Z8673 Personal history of transient ischemic attack (TIA), and cerebral infarction without residual deficits: Secondary | ICD-10-CM

## 2016-06-09 DIAGNOSIS — N289 Disorder of kidney and ureter, unspecified: Secondary | ICD-10-CM

## 2016-06-09 LAB — BASIC METABOLIC PANEL
BUN: 23 mg/dL — ABNORMAL HIGH (ref 7.0–19.0)
CO2: 18 mEq/L — ABNORMAL LOW (ref 22–29)
Calcium: 8.1 mg/dL — ABNORMAL LOW (ref 8.5–10.5)
Chloride: 103 mEq/L (ref 100–111)
Creatinine: 2.3 mg/dL — ABNORMAL HIGH (ref 0.6–1.0)
Glucose: 110 mg/dL — ABNORMAL HIGH (ref 70–100)
Potassium: 4 mEq/L (ref 3.5–5.1)
Sodium: 127 mEq/L — ABNORMAL LOW (ref 136–145)

## 2016-06-09 LAB — CBC
Absolute NRBC: 0 10*3/uL
Hematocrit: 31.3 % — ABNORMAL LOW (ref 37.0–47.0)
Hgb: 10 g/dL — ABNORMAL LOW (ref 12.0–16.0)
MCH: 22 pg — ABNORMAL LOW (ref 28.0–32.0)
MCHC: 31.9 g/dL — ABNORMAL LOW (ref 32.0–36.0)
MCV: 68.9 fL — ABNORMAL LOW (ref 80.0–100.0)
MPV: 10.8 fL (ref 9.4–12.3)
Nucleated RBC: 0 /100 WBC (ref 0.0–1.0)
Platelets: 227 10*3/uL (ref 140–400)
RBC: 4.54 10*6/uL (ref 4.20–5.40)
RDW: 18 % — ABNORMAL HIGH (ref 12–15)
WBC: 6.23 10*3/uL (ref 3.50–10.80)

## 2016-06-09 LAB — GLUCOSE WHOLE BLOOD - POCT
Whole Blood Glucose POCT: 131 mg/dL — ABNORMAL HIGH (ref 70–100)
Whole Blood Glucose POCT: 131 mg/dL — ABNORMAL HIGH (ref 70–100)
Whole Blood Glucose POCT: 143 mg/dL — ABNORMAL HIGH (ref 70–100)
Whole Blood Glucose POCT: 150 mg/dL — ABNORMAL HIGH (ref 70–100)

## 2016-06-09 LAB — GFR: EGFR: 21.4

## 2016-06-09 MED ORDER — CHLORHEXIDINE GLUCONATE 0.12 % MT SOLN
15.0000 mL | OROMUCOSAL | Status: DC
Start: 2016-06-09 — End: 2016-06-12
  Filled 2016-06-09: qty 15

## 2016-06-09 MED ORDER — AMIODARONE HCL 200 MG PO TABS
400.0000 mg | ORAL_TABLET | Freq: Two times a day (BID) | ORAL | Status: DC
Start: 2016-06-11 — End: 2016-06-12
  Administered 2016-06-11: 400 mg via ORAL
  Filled 2016-06-09: qty 2

## 2016-06-09 MED ORDER — SODIUM CHLORIDE 0.9 % IV SOLN
INTRAVENOUS | Status: DC
Start: 2016-06-09 — End: 2016-06-12

## 2016-06-09 MED ORDER — SODIUM BICARBONATE 650 MG PO TABS
1300.0000 mg | ORAL_TABLET | Freq: Two times a day (BID) | ORAL | Status: DC
Start: 2016-06-09 — End: 2016-06-10
  Administered 2016-06-09 – 2016-06-10 (×2): 1300 mg via ORAL
  Filled 2016-06-09 (×3): qty 2

## 2016-06-09 MED ORDER — ISOSORBIDE MONONITRATE ER 30 MG PO TB24
30.0000 mg | ORAL_TABLET | Freq: Every day | ORAL | Status: DC
Start: 2016-06-09 — End: 2016-06-11
  Administered 2016-06-09 – 2016-06-10 (×2): 30 mg via ORAL
  Filled 2016-06-09 (×2): qty 1

## 2016-06-09 MED ORDER — MUPIROCIN CALCIUM 2 % NA OINT
TOPICAL_OINTMENT | NASAL | Status: AC
Start: 2016-06-09 — End: 2016-06-12
  Administered 2016-06-11 – 2016-06-12 (×2): 0.9 via NASAL
  Filled 2016-06-09 (×2): qty 0.9

## 2016-06-09 MED ORDER — AMLODIPINE BESYLATE 5 MG PO TABS
5.0000 mg | ORAL_TABLET | Freq: Every day | ORAL | Status: DC
Start: 2016-06-10 — End: 2016-06-11
  Administered 2016-06-10: 5 mg via ORAL
  Filled 2016-06-09: qty 1

## 2016-06-09 MED ORDER — CARVEDILOL 6.25 MG PO TABS
12.5000 mg | ORAL_TABLET | Freq: Two times a day (BID) | ORAL | Status: DC
Start: 2016-06-09 — End: 2016-06-11
  Administered 2016-06-09 – 2016-06-11 (×4): 12.5 mg via ORAL
  Filled 2016-06-09 (×4): qty 2

## 2016-06-09 MED ORDER — HYDRALAZINE HCL 25 MG PO TABS
50.0000 mg | ORAL_TABLET | Freq: Two times a day (BID) | ORAL | Status: DC
Start: 2016-06-09 — End: 2016-06-11
  Administered 2016-06-10 (×2): 50 mg via ORAL
  Filled 2016-06-09 (×4): qty 2

## 2016-06-09 NOTE — Progress Notes (Signed)
[facility]  Progress Note    Assessment:  1. Subacute on CRF; non oliguric (overall rise in serum creatinine from 1/17 -> 9/17)  2. 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 Metabolic acidosis; non anion gap  4. Anemia; microcytic/hypochromic  5. MMPs    Plan:  1.  NaHCO3 1300/1300mg  po  2.  NS @ 50cc/hr  3.  CXR (AP) now  4.  Tapering antihypertensive regimen with tighter hold parameters; avoid overcontrol of BP  5.  qam labs to include Mf/PO4    Further recommendations to follow    Thank you    Elmyra Ricks, MD  Office - [phone]  [facility] [facility] - [phone]  ++++++++++++++++++++++++++++++++++++++++++++++++++++++++++++++  Subjective:  No new complaints    Medications:  Scheduled Meds:  Current Facility-Administered Medications   Medication Dose Route Frequency   . [START ON 06/10/2016] amLODIPine  5 mg Oral Daily   . aspirin EC  325 mg Oral Daily   . carvedilol  12.5 mg Oral Q12H [facility]   . cloNIDine  0.1 mg Oral Q12H [place]   . famotidine  20 mg Oral Daily   . heparin (porcine)  5,000 Units Subcutaneous Q8H [place]   . hydrALAZINE  50 mg Oral Q12H SCH   . isosorbide mononitrate  30 mg Oral Daily   . polyethylene glycol  17 g Oral Daily   . senna-docusate  1 tablet Oral BID   . SITagliptin  25 mg Oral Daily   . sodium bicarbonate  1,300 mg Oral BID     Continuous Infusions:  . sodium chloride       PRN Meds:acetaminophen, Nursing communication: Adult Hypoglycemia Treatment Algorithm **AND** dextrose **AND** dextrose **AND** glucagon (rDNA) **AND** Nursing communication: Document Significant Event Note, insulin lispro, insulin lispro, naloxone, ondansetron, oxyCODONE-acetaminophen, traMADol    Objective:  Vital signs in last 24 hours:  Temp:  [97.5 F (36.4 C)-98.3 F (36.8 C)] 97.6 F (36.4 C)  Heart Rate:  [68-86] 68  Resp Rate:  [18-20] 18  BP: (100-140)/(55-70) 125/62  Intake/Output last 24 hours:    Intake/Output Summary (Last 24 hours) at 06/09/16 1711  Last data filed at  06/09/16 0631   Gross per 24 hour   Intake              300 ml   Output                0 ml   Net              300 ml     Intake/Output this shift:  No intake/output data recorded.    Physical Exam:   Gen: Alert/pleasant   CV: S1 S2 N RRR   Chest: No rales/no rhonchi   Ab: ND NT soft no HSM +BS   Ext: No edema    Labs:    Recent Labs  Lab 06/09/16  0517 06/08/16  0526 06/07/16  0632  06/04/16  0650   Glucose 110* 118* 118* More results in Results Review 127*   BUN 23.0* 19.8* 18.7 More results in Results Review 25.8*   Creatinine 2.3* 2.0* 1.9* More results in Results Review 2.0*   Calcium 8.1* 8.2* 8.2* More results in Results Review 8.9   Sodium 127* 130* 135* More results in Results Review 136   Potassium 4.0 3.9 4.1 More results in Results Review 4.3   Chloride 103 106 110 More results in Results Review 108   CO2  18* 16* 16* More results in Results Review 18*   Albumin  --   --   --   --  2.4*   Magnesium  --   --   --   --  1.9   More results in Results Review = values in this interval not displayed.    Recent Labs  Lab 06/09/16  0517 06/04/16  0650 06/03/16  0428   WBC 6.23 6.13 5.37   Hgb 10.0* 11.0* 10.2*   Hematocrit 31.3* 34.4* 31.8*   MCV 68.9* 69.8* 69.0*   MCH 22.0* 22.3* 22.1*   MCHC 31.9* 32.0 32.1   RDW 18* 18* 18*   MPV 10.8 10.5 10.2   Platelets 227 258 226

## 2016-06-09 NOTE — OT Eval Note (Signed)
[facility]   Occupational Therapy Evaluation/Discharge    Patient: Jacqueline Weber    MRN#: [id number]   [facility]  Bed: HU765/YY503-54                                     Discharge Recommendations:   Discharge Recommendation: Home with supervision   DME Recommended for Discharge: Shower chair      Assessment:   Jacqueline Weber is a 65 y.o. female admitted 06/08/2016 with NSTEMI s/p heart cath showing MVD, here for CABG evaluation. Administered sternal precautions/CABG handout to educate pt/family of precautions if surgery occurs. Pt presents at/near functional baseline, independent with basic ADLs.  No acute OT needs identified. D/C acute OT services.    **Please re-consult therapies if pt undergoes CABG procedure. Thank you.**    Therapy Diagnosis: ADL assessment    Rehabilitation Potential: N/A    Treatment Activities: Evaluation, Sternal precautions handout/education if surgery occurs    Educated the patient to role of occupational therapy, plan of care, goals of therapy and safety with mobility and ADLs.    Plan:   D/C acute OT services     Treatment/Interventions: N/A    Risks/benefits/POC discussed with patient/family       Precautions and Contraindications:   Falls  Laotian speaking - family to assist with translation    Consult received for [name] for OT Evaluation and Treatment.  Patient's medical condition is appropriate for Occupational Therapy intervention at this time.      History of Present Illness:    Jacqueline Weber is a 65 y.o. female admitted on 06/08/2016 with "h/o HTN, type II DM, and CKD admitted to [facility] with hypertensive crisis and chest pain. She had heart cath showing multivessel disease. She was sent to [facility] for CABG evaluation. She was also noted to have acute on chronic renal disease." (Per H and P)    Admitting Diagnosis: CAD (coronary artery disease) [I25.10]  NSTEMI (non-ST elevated myocardial infarction) [I21.4]    Past  Medical/Surgical History:  Past Medical History:   Diagnosis Date   . Cataracts, bilateral    . Cerebrovascular accident    . Chronic kidney disease    . CVA (cerebral infarction) 03/2013   . Diabetes mellitus    . Hypercholesteremia    . Hypertension    . Myocardial infarction        No past surgical history on file.        Imaging/Tests/Labs:  US Carotid Duplex Dopp Comp Bilateral  Result Date: 06/06/2016   Mild to moderate bilateral carotid bulb calcified plaque without hemodynamically significant stenosis.     [place] Groin Pseudoaneurysm Right W Dopp  Result Date: 06/08/2016   No evidence of AV fistula or pseudoaneurysm, right groin.     Social History:   Prior Level of Function:  Independent ADLs and mobility  Assistive Devices: SPC for communtiy  Baseline Activity: Community level  DME Currently at Home: [place], grab bars  Home Living Arrangements: With family  Type of Home: Townhouse  Home Layout: 1 STE, bed/bath upstairs, tub shower    Subjective: "My groin hurts."    Patient is agreeable to participation in the therapy session. Nursing clears patient for therapy.     Patient Goal: To get better    Pain:   Scale: 6-7/10  Location: Right groin  Intervention: Positioned for comfort post  session    Objective:   Patient is seated at window with family in room with telemetry and peripheral IV in place.    Cognitive Status and Neuro Exam:  Intact  Pleasant, cooperative  Follows directions via interpretation from family    Musculoskeletal Examination  Gross ROM: WFL  Gross Strength: WFL      Sensory/Oculomotor Examination  Auditory: WFL  Tactile: WFL  Vision: WFL      Activities of Daily Living  Eating: Ind hand to mouth  Grooming: Ind  Bathing: nt  UE Dressing: Ind  LE Dressing: Ind donn/doff socks   Toileting: declined    Functional Mobility:  Supine to Sit: Ind  Sit to Supine: Ind  Sit to Stand: Ind  Transfers: Ind     Licensed conveyancer Sitting: good  Dynamic Sitting: good  Static Standing: good  Dynamic Standing:  good    Participation and Activity Tolerance  Participation Effort: good  Endurance: good    Patient left with call bell within reach, all needs met, SCDs off as found, fall mat in place, chair alarm off as found - family in room and all questions answered. RN notified of session outcome and patient response.              Salli Quarry OTR/L  Pager # [phone]       Time of treatment:   OT Received On: 06/09/16  Start Time: 0907  Stop Time: 0930  Time Calculation (min): 23 min

## 2016-06-09 NOTE — Progress Notes (Signed)
Met with patient and daughter to discuss CABG earlier this afternoon.   Patient has CKD stage IV, Renal to see pt today, CABG date tentatively Monday 06/12/2016 pending renal clearance and optimization.    Orpah Cobb, ACNP  CV Surgery Consult   [phone]

## 2016-06-09 NOTE — Plan of Care (Addendum)
Problem: Safety  Goal: Patient will be free from injury during hospitalization  Outcome: Progressing    Goal: Patient will be free from infection during hospitalization  Outcome: Progressing      Problem: Pain  Goal: Pain at adequate level as identified by patient  Outcome: Progressing      Problem: Side Effects from Pain Analgesia  Goal: Patient will experience minimal side effects of analgesic therapy  Outcome: Completed Date Met: 06/09/16      Problem: Discharge Barriers  Goal: Patient will be discharged home or other facility with appropriate resources  Outcome: Progressing      Problem: Psychosocial and Spiritual Needs  Goal: Demonstrates ability to cope with hospitalization/illness  Outcome: Progressing      Problem: Hemodynamic Status: Cardiac  Goal: Stable vital signs and fluid balance  Outcome: Progressing      Problem: Inadequate Tissue Perfusion  Goal: Adequate tissue perfusion will be maintained  Outcome: Progressing      Problem: Ineffective Gas Exchange  Goal: Effective breathing pattern  Outcome: Completed Date Met: 06/09/16      Problem: Nutrition  Goal: Nutritional intake is adequate  Outcome: Progressing      Problem: Moderate/High Fall Risk Score >5  Goal: Patient will remain free of falls  Outcome: Progressing      Problem: Pre-op Phase - Cardiac Surgery  Goal: Patient will be ready for surgery  Outcome: Progressing      Problem: Renal Instability  Goal: Fluid and electrolyte balance are achieved/maintained  Outcome: Progressing    Goal: Perineal skin integrity is maintained or improved  Outcome: Not Progressing    Goal: Free from infection  Outcome: Progressing    Goal: Nutritional intake is adequate  Outcome: Progressing    Pt A&Ox4, mainly [place] speaking, daughters at bedside to translate, waiver completed and in chart. SBP 120-130s, SR on tele in the 60-70s, SpO2 98% on RA, afebrile. Tolerating diet, no nausea or vomiting. No BM this shift, +BS and flatus. OOB w/ standby assist. Right groin  covered with guaze, tender but soft. US done to rule out pseudoaneurysm. NS started at 50 mL/hr.  No complaints of pain this shift. Bedside spirometry done, waiting for [place] of LE. CABG tentatively scheduled for 10/9. Fall mat in place, call light within reach. Will continue to monitor.

## 2016-06-09 NOTE — PT Eval Note (Signed)
[facility]   Physical Therapy Evaluation     Patient: Jacqueline Weber    MRN#: [id number]   Unit[facility]  Bed: [id number]/WS568-12    Discharge Recommendations:   Discharge Recommendation: Home with supervision       DME Recommendation: DME Recommended for Discharge:  (pt has cane, walker)    Assessment:   Jacqueline Weber is a 65 y.o. female admitted 06/08/2016 for CABG evaluation. She ambulates 300' with stand by assist and ascends and descends 7 steps with CGA. With ambulation she is generally unsteady and reaches for UE support but has no LOB. Pt has the following impairments and would benefit from skilled PT to increase functional mobility for improved safety.    Impairments: Decreased functional mobility, impaired gait, decreased endurance, decreased balance    Therapy Diagnosis: Impaired functional mobility    Rehabilitation Potential: Good    Treatment Activities: Evaluation and gait training  Educated the patient to role of physical therapy, plan of care, goals of therapy and safety with mobility and ADLs.    Plan:   PT Frequency: follow-up visit only    Treatment/Interventions: Gait training, therapeutic exercise, therapeutic activity, neuromuscular reeducation, endurance training    Risks/benefits/POC discussed with pt       Precautions and Contraindications:   Falls - moderate  Speaks Laotian - interpretor [phone] used, then family translated as needed  Educated pt on sternal precautions in anticipation of possible CABG    Consult received for Jacqueline Weber for PT Evaluation and Treatment.  Patient's medical condition is appropriate for Physical Therapy intervention at this time.      History of Present Illness:   Jacqueline Weber is a 65 y.o. female admitted on 06/08/2016 "from [facility] for evaluation of CABG. Patient presented initially to [facility] with hypertensive crises and chest pain. On admission, patient noted to have acute on chronic renal failure,  elevated BP, and ST depression. Cardiology consulted, patient had LHC that showed Multivessel disease. Medications were optimized and she was sent her for CABG evaluation." (Per H and P)    Medical Diagnosis: CAD (coronary artery disease) [I25.10]  NSTEMI (non-ST elevated myocardial infarction) [I21.4]    Past Medical/Surgical History:  Past Medical History   Past Medical History:   Diagnosis Date   . Cataracts, bilateral     . Cerebrovascular accident     . Chronic kidney disease     . CVA (cerebral infarction) 03/2013   . Diabetes mellitus     . Hypercholesteremia     . Hypertension     . Myocardial infarction      Past Surgical History   No past surgical history on file.    Imaging/Tests/Labs:  Reviewed    Social History:   Prior Level of Function: I with ADLs and ambulation  Assistive Devices: intermittent use of cane  Baseline Activity: household ambulation  DME Currently at Home: cane, walker, grab bars  Home Living Arrangements: with family  Type of Home: townhouse  Home Layout: no steps to enter, 14 steps inside    Subjective:   Patient is agreeable to participation in the therapy session. Nursing clears pt for therapy.    Patient Goal: To get better    Pain:   Pt had no c/o pain throughout session.     Objective:   Patient received  in bed  with telemetry in place.    Cognitive Status:  Pt is alert and oriented throughout session.  Follows commands without difficulty.    Musculoskeletal Examination  RUE AROM: WFL  LUE AROM: WFL  RLE AROM: WFL  LLE AROM: WFL    RLE Strength: hip flexors, quads, and ankle dorsiflexors 4/5  LLE Strength: hip flexors, quads, and ankle dorsiflexors 4/5    Balance  Static Sitting: Good  Dynamic Sitting: Good  Static Standing: Fair  Dynamic Standing: Fair    Functional Mobility  Supine to Sit: independent  Sit to Stand: independent  Stand to Sit: independent    Ambulation  Level of assistance required: stand by assist   Ambulation Distance: 300'  Pattern: generally unsteady,  intermittently reaching for UE support  Device Used for Ambulation: none  Weightbearing Status: no restrictions  Stair Management: CGA  Number of Stairs: 7  Device Used on Stairs: one hand on railing  Pattern on Stairs: no LOB    AM-PACT Inpatient Short Forms  Inpatient AM-PACT Performed? (PT): Basic Mobility Inpatient Short Form  AM-PACT "6 Clicks" Basic Mobility Inpatient Short Form  Turning Over in Bed: None  Sitting Down On/Standing From Armchair: None  Lying on Back to Sitting on Side of Bed: None  Assist Moving to/from Bed to Chair: None  Assist to Walk in Hospital Room: A little  Assist to Climb 3-5 Steps with Railing: A little  PT Basic Mobility Weber Score: 22  CMS 0-100% Score: 20.91%    Participation and Activity Tolerance  Participation Effort: Good  Endurance: Fair    Vitals  Stable throughout session, after session SpO2 97%    Patient left with call bell within reach, all needs met, SCDs not in place, as when entered room, fall mat in place, and bed alarm not in place, as when entered room, pt not a high falls risk, and all questions answered. RN notified of session outcome and patient response.     Goals:  Goals  Pt Will Ambulate: > 200 feet, with supervision, to maximize functional mobility and independence  Pt Will Go Up / Down Stairs: 3-5 stairs, with contact guard assist, to maximize functional mobility and independence, Goal met      Jacqueline Weber, PT, DPT   Pager number: [phone]    Time of Treatment  PT Received On: 06/09/16  Start Time: 0800  Stop Time: 0822  Time Calculation (min): 22 min

## 2016-06-09 NOTE — UM Notes (Addendum)
06/08/16 1902  Admit to Inpatient        [facility] Cardiology grg  LOC floor  GLOS na    65 years old female with h/o HTN, type II DM, and CKD admitted to [facility] with hypertensive crisis and chest pain. She had heart cath showing multivessel disease. She was sent to [facility] for CABG evaluation. She was also noted to have acute on chronic renal disease.     Assessment:     # NSTEMI s/p Heart cath showing MVD  # Acute on chronic renal disease - improving  # Hypertensive crisis - improved  # Anemia of chronic disease  # Type II Diabetes  # h/o CVA    Plan:     - admit to inpatient w/ tele  - continue cardiac meds: ASA, statin, BB, and ARB.   - CABG workup per CT surgery. NPO after midnight  - BP stable now, continue current BP meds  - type II DM, continue home meds and start LDSS. Monitor BGs  - start Mirlax and Pericolace for constipation      Disposition: (Please see PAF column for Expected D/C Date)   Today's date: 06/08/2016  Admit Date: 06/08/2016  4:44 PM  Anticipated medical stability for discharge:Red - not tomorrow - estimated month/date: 06/11/16  Service status: Inpatient: risk of morbidity and mortality  Clinical Milestones:   Cardiac evaluation for CABG  Anticipated discharge needs: TBD    Cardiology consult:   Assessment:      Admitted to [facility] with dyspnea, HTNsive urgency, chest pain   Emergent TEE LH was neg for dissection   LHC 06/06/16:severe 3-vessel disease, 90% mid-PDA lesion, 60% RPL lesion, 20% left main lesion, 90% proximal LAD, 50-60% mid LAD, and 80% in diagonal, 90% LCA lesion, EF 70%   [place] groin 06/08/16: No AV fistula or pseudoaneurysm, right groin   Recurrent hypertensive crisis with chest pain, BP improved. No renal artery stenosis by cath on 06/06/16   AKI on CKD 4Scr up to 2.6, slowly improving    Type II MI in setting of hypertensive crisis last week, TnI went up to 8.03 - cath deferred due to renal insufficiency at the time   Abnormal OPnuclear stress test findings, February  2017, nuclear stress test, small-sized, mild intensity inferoapical ischemia, EF 72%   February 2017, echo, EF 70%, concentric LVH, grade I diastolic dysfunction,mild AI    Plan:      Maintain current regimen of coreg 25 BID, catapress 0.1 BID, hydralazine 100 BID, and amlodipine 10 qD   [place] cozaar and use imdur for now.   CV surgery to evaluate   Appreciate neph input. Cr stable.      Vs-97.9, 72, 12, 140/68, 96% ra,   Labs: glucose 174, 131, 110, bun 23.0, cr 2.3, na 127,   NPO, po coreg bid, heparin sq tid, lispro sq prn x 1, telemetry,  VCT surgery consult    Brynda Greathouse MSN, GCNS-BC, ACM  Utilization Review Case Manager  [employer]  [phone]

## 2016-06-09 NOTE — Plan of Care (Signed)
Problem: Safety  Goal: Patient will be free from injury during hospitalization  Outcome: Progressing   06/09/16 1321   Goal/Interventions addressed this shift   Patient will be free from injury during hospitalization  Assess patient's risk for falls and implement fall prevention plan of care per policy;Use appropriate transfer methods;Provide and maintain safe environment;Ensure appropriate safety devices are available at the bedside;Include patient/ family/ care giver in decisions related to safety;Hourly rounding;Assess for patients risk for elopement and implement Elopement Risk Plan per policy       Problem: Pain  Goal: Pain at adequate level as identified by patient  Outcome: Progressing   06/09/16 1321   Goal/Interventions addressed this shift   Pain at adequate level as identified by patient Identify patient comfort function goal;Assess for risk of opioid induced respiratory depression, including snoring/sleep apnea. Alert healthcare team of risk factors identified.;Assess pain on admission, during daily assessment and/or before any "as needed" intervention(s);Reassess pain within 30-60 minutes of any procedure/intervention, per Pain Assessment, Intervention, Reassessment (AIR) Cycle;Evaluate if patient comfort function goal is met;Evaluate patient's satisfaction with pain management progress;Offer non-pharmacological pain management interventions;Consult/collaborate with Pain Service;Consult/collaborate with Physical Therapy, Occupational Therapy, and/or Speech Therapy;Include patient/patient care companion in decisions related to pain management as needed       Problem: Hemodynamic Status: Cardiac  Goal: Stable vital signs and fluid balance  Outcome: Progressing   06/09/16 1321   Goal/Interventions addressed this shift   Stable vital signs and fluid balance Monitor/assess vital signs and telemetry per unit protocol;Weigh on admission and record weight daily;Assess signs and symptoms associated with cardiac  rhythm changes;Monitor intake/output per unit protocol and/or LIP order;Monitor lab values;Monitor for leg swelling/edema and report to LIP if abnormal       Problem: Pre-op Phase - Cardiac Surgery  Goal: Patient will be ready for surgery  Outcome: Progressing   06/09/16 1321   Goal/Interventions addressed this shift   Patient will be ready for surgery Document height and weight;Verify that all platelet inhibitors are discontinued;CHG shower/bath the night before and morning of surgery;Ensure all pre-op orders are complete;Send the patient belongings with the family or to receiving unit;Ensure patient and family is fully educated regarding the procedure and expectations       Problem: Renal Instability  Goal: Fluid and electrolyte balance are achieved/maintained  Outcome: Progressing   06/09/16 1321   Goal/Interventions addressed this shift   Fluid and electrolyte balance are achieved/maintained  Monitor intake and output every shift;Monitor/assess lab values and report abnormal values;Provide adequate hydration;Assess for confusion/personality changes;Assess and reassess fluid and electrolyte status;Monitor daily weight;Observe for cardiac arrhythmias;Monitor for muscle weakness       Comments: Patient is A&Ox4. HR NSR 60-80s. Denied CP, palpitations, and SOB. Laotian speaking, does understand some [place]. Patient prefers her daughter to interpret, waiver signed and in chart. Bedside spirometry completed this shift. Pt scheduled for BLE venous dopplers with Rn transport this afternoon, [place] department to be updated regarding patient transferring to CVSD bed 269. Patient safety and fall precautions maintained with Q1H rounding done. Refused bed alarm, ambulates with steady gait in room. Patient needs addressed and questions answered.     @1315  Report called to RN Barnetta Chapel, as pt will be transferred to [facility]. RN concerns and questions addressed. Pt and her daughter were updated as to transfer. Pt transported with RN,  all belongings sent with her.      Plan per MD is possible CABG workup, CV surgery consult pending.

## 2016-06-09 NOTE — Consults (Addendum)
FINAL                    [facility]     Patient Name:           Jacqueline Weber, Jacqueline Weber Date:             06/09/2016            Age      65  [place] MRN:              [id number]              Gender:  Female   Account #:        [id number]          Race:    Other  Date of Birth:          [birth year]  Scheduled Surgeon:      Christifer Chapdelaine,M.D.  Consult Performed By:   [facility]:               [facility]  Assessment Location:    Patient Room     Referring Physician(s):     History of Present Illness:  A cardiac surgery consult has  been requested for Ms Jacqueline Weber  a 65 y.o female from [place],  [place] of [place] (05/2016), HTN, HLD, CKD, CVA  (no residual) who underwent  a cardiac catherization and was found  have multivessel disease requiring  surgical revascularization.  In  September she had an admission  to [facility] from 9/129/15 with chief  complaint of SOB. She had hypertensive  urgency; she was placed on  NTG gtt for BP management.  She also ruled in for a NSTEMI with a  troponin peak of 8.02. In February  2017 she had a outpatient nuclear  stress test that was abnormal,  small sized , mild intensity  inferoapical ischemia.  At  that time a cardiac catherization was  deferred relate to elevated  kidney function, during hospitalization  cath was also deferred. She  was then readmitted on 9/27 with SOB and  chest pain, at that time she  didn't have an elevation in her cardiac  enzymes, blood pressure was  once again not well controlled. She  underwent a cardiac catherization  on 06/06/2016 found to have  multivessel disease. TEE was  done on 9/27 to rule out dissection,  not ordered r/t kidney function  EF 70% mild MR/TR noted.     Preliminary STS Risk Score  Mortality 2.85%  Morbitity and Mortality 29.5%             TRANSLATION:  Provided by Daughter     Past Medical History     Coronary Artery  Disease  Cerebrovascular Disease CVA  Diabetes   Control:  oral  Hypertension  Hypercholesterolemia  Renal Disease      Acute on  Chronic  Previous  MI     Timing: >21  Days  Smoking  History:       Never  Anemia        Past Medical  History:     Past Surgical        NONE  History:  Family History:      NONE  Social History:      Lives  w/ her  family, daughter,  [name] present                       bedside  Radiology  and Test  CBC   WBC: 6.23; HB: 10.0;  Hct: 31.3; PLT:  227;  Review:              BMP   NA: 127;    K: 4.0;     BUN: 23;                       Creatinine:  2.3; Glucose:  110                       9/12  Lipids  Total Cholesterol:  186;                       Triglycerides:  153; HDL:  47;        LDL:  108;                       VLDL:  31;                       Alk Phos:  58;  ALT: 10;     AST: 14                       9/12TP:  #1 0.05; #2  8.03; #3: 7.24;                       PT: 12.9;   PTT: 40; INR:  1.0; HbA1c: 6.3;                          LEFT  HEART CATHERIZATION  [facility] 10/3 Dr Sallyanne Havers                       FINDINGS:                       HEMODYNAMICS:                       Left  ventricular enddiastolic  pressure  is 10                       15 mmHg.   There is no  gradient across the  aortic                       valve.   The patient has  severe essential                       hypertension.   Despite  sedation, her blood                       pressure  in the laboratory  was approximately                       185/80.                          LEFT  VENTRICLE:  Not injected.                          ABDOMINAL  AORTA:                       The abdominal  aorta is  normal caliber.   There                       no evidence  of atherosclerotic  disease  of the                       proximal  renal arteries.                          RIGHT  CORONARY:                       Large,  dominant calcified  vessel, which  has 20%                       lesions  in the mid and  distal  segment.   There                       an 80%  to 90% stenosis  in the moderately  large                       right  posterior descending  coronary artery.                       There  is a 60% lesion  in the moderatesized  right posterolateral branch.     LEFT CORONARY ARTERY:  There is calcification of the  left main trunk,  LAD and circumflex.  The left  main trunk is  narrowed by 20% distally.  The  left anterior  descending  has a 90% proximal stenosis with  a long segment  of 50% to 60% stenosis in the  mid portion of  vessel.  There is an 80% stenosis  of the  moderatesized first diagonal  branch.  The  circumflex has a 90% proximal  stenosis above  origin of a large first marginal  branch.     FINAL DIAGNOSES:  1.  Severe 3vessel coronary disease  with known  normal LV function,  (by echocardiogram).  2.  Recent nonQ myocardial infarction.  3.  Severe hypertension.  4.  Insulindependent diabetes.  5.  Hyperlipidemia.  6.  Remote cerebrovascular accident  with  complete recovery.  7.  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     PLAN:  1.  Renal function will be followed  carefully.  It should be noted that 90 mL  of contrast  material was utilized during the  procedure.  2.  Her films will be reviewed  by the cardiac  surgeons for possible coronary  artery bypass  graft surgery.  3.  Blood pressure medication  adjustment per  nephrology.  4.  The patient will follow up  in our office  after hospital discharge.  She  will also follow  up with the nephrologist and with  her primary  physician.     TRANSTHORACIC ECHOCARDIOGRAM 05/17/2016  IMPRESSION:  1.  The quality of the study is  technically  adequate for interpretation.  2.  The left ventricle is normal  in size. Left  ventricular systolic function  is normal. There  are no regional wall motion abnormalities.  Estimated EF is 65%. There is  mild concentric  left ventricular hypertrophy.  There is evidence  of abnormal  diastolic LV function.  3.  The left atrium is moderately  dilated in  size.  4.  The aortic valve is trileaflet.  Sclerotic  aortic valve. Normal valve excursion  is present.  There is mild to moderate aortic  insufficiency.  No aortic stenosis is present.  The aortic root  is normal in size.  5.  The mitral valve is structurally  normal.  Mild mitral insufficiency  is present.  6.  The right ventricle is normal  in size and  function.  7.  The right atrium is normal  in size.  8. The tricuspid valve is structurally  normal.  There is no significant tricuspid  insufficiency.  9. The pulmonic valve is structurally  normal.  There is no significant pulmonic  insufficiency.  10. There is no evidence of pulmonary  hypertension.  11. Trace pericardial effusion..  12. The atrial septum is structurally  normal.  shunt by color flow doppler.     CONCLUSION:     Left ventricle is normal in size  with normal  systolic function. LVEF 65%.  Diastolic dysfunction noted.  Mild concentric left ventricular  hypertrophy.  Left atrium moderately dilated.  Sclerotic aortic valve. Mild to  moderate aortic  regurgitation.  Mild mitral regurgitation.  Trace pericardial effusion        TRANSESOPHAGEAL ECHOCARDIOGRAM  05/31/16  IMPRESSION:     The left ventricle is normal in  size. Left  ventricular function is hyperdynamic.  Regional  wall motion abnormalities are  not present.  Estimated EF is 70%.     The left atrium and left atrial  appendage  demonstrate no clot.     The aortic valve is trileaflet,  sclerotic.  There is mild aortic  insufficiency.  There is  visual aortic stenosis. The aortic  root is  grossly normal.     The mitral valve is structurally  intact. There  is mild mitral insufficiency.     The right ventricle is normal  in size and normal  in function.     The right atrium demonstrates  no clot.     The tricuspid valve is structurally  intact.  There is mild tricuspid  insufficiency.  The pulmonic valve is structurally   not well  seen. There is trace  pulmonic insufficiency.     No pericardial effusion or intracardiac  masses.     The atrial septum is structurally  intact. There  is no shunt by color  flow doppler.     The descending thoracic aorta  was visualized  appeared to have mild, nonmobile  mild detectable  atherosclerosis, but no thrombus,  or spontaneous  echo contrast.  There is no evidence of a dissection  or aneurysm  formation in the ascending or  descending aorta.  Limited visualization of the aortic  arch  revealed no clear aortic dissection.     CONCLUSION:     1. No evidence of an aortic dissection  2. Dynamic left ventricular function  with an  of 70%  3. Adequately functioning cardiac  valves  CAROTID  ULTRASOUND 06/06/2016                      RIGHT  CAROTID: Imaging demonstrates mild to                      moderate  calcified plaque near the carotid bulb.                      Doppler  is normal.                         LEFT CAROTID:  Imaging demonstrates mild                      calcified  plaque near the bulb. Doppler is                      normal.                         PROXIMAL  VERTEBRAL ARTERIES: Patent. Antegrade                      flow.                         MEASURED  VELOCITIES (CM/S):                         Right  CCA:              87                      Right  proximal ICA (peak systolic): 929                      Right  proximal ICA (end diastolic): 26                      Right  ECA:               106                         Left CCA:                  86                      Left proximal  ICA (peak systolic): 97                      Left proximal  ICA (end diastolic): 24                      Left ECA:             140                         IMPRESSION:                       Mild  to moderate bilateral carotid bulb                      calcified  plaque  without  hemodynamically significant stenosis.        ALLERGY                   DESCRIPTION  PENICILLINS              Drug Class  LISINOPRIL               DRUG INGREDI  SHELLFISHDERIVED        Drug Class  PRODUCTS  MOSQUITO (CULEX PIPIENS) DRUG INGREDI  ALLERGY SKIN TEST  ALLERGIES NOT ON FILE  NO KNOWN ALLERGIES       SYSTEMIC        Home Medications:     MEDICATION     DOSE   UNIT    ROUTE      FREQUENCY   COMMENT  ASA            325    mg      PO         Daily  clonidine      0.1    mg      PO         BID  Coreg          25     mg      PO         BID  Cozaar         25     mg      PO         Daily  hydralazine    100    mg      PO         Daily  januvia        25     mg      PO         Daily  Norvasc        10     mg      PO         Daily  simvastatin    10     mg      PO         Daily  vit d          2000   units   PO         BID        Hospital Medications:     MEDICATION     DOSE   UNIT    ROUTE      FREQUENCY   COMMENT  norvasc        10     mg      PO         Daily  ASA            325    mg      PO         Daily  Coreg          25     mg      PO         BID  clonidine      0.1    mg      PO         BID  pericolace     8.650 mg      PO         BID  Januvia        25     mg  PO         Daily  Pepcid         20     mg      PO         Q12H  Heparin        5000   units   SQ         Q8H  hydralazine    100    mg      PO         Daily  Imdur          30     mg      PO         Daily  Miralax        17     gram    PO         Daily           Physical Examination     HR:     75   BP      129/6BP           RR: 20  O2     94 % Temp: 98.3               Right:  1    Left:                Sat:  Weight: 144 lbs  65.31 kg Height: 60 in  152.4  cm        Review of Systems:     Respiratory  Denies shortness of breath and dyspnea on exertion.  CV           Denies chest pain.  Abdomen      Denies abdominal pain.  Extremities  Denies leg pain or swelling.  Neurological No gross motor or sensory defects. Steady gait.  Denies               dizziness, lightheadedness, sudden vision change.     Physical  Exam  General:        Well Nourished, welldeveloped,  in no acute distress  Skin:           Warm and dry.  Heart:          S1 S2, regular rate  and rhythm, no Rubs, murmurs or                  Gallops.  Lungs:          Clear bilaterally.  Neck:           No bruit  Abdomen:        Soft, nontender,  with active bowel sounds.  Extremities:    No edema, inflammation  or pain.     Diagnosis:  NSTEMI peak trop 8.03  Multivessel CAD  CHF grade I diastolic dysfunction  Chronic Kidney disease stage  IV  Hyponatremia  Anemia of chronic disease  HTN, w/ hx of HTN urgency during  prior hospitalizations  HLD  Diabetes Mellitus (HGA1C 6.3)     Treatment Plan:  CABG tentatively scheduled  Monday 06/12/2016 pending Nephrology  clearance     Vein Mapping Pending  Preop Bactroban and amiodarone  will be ordered  Monitor electrolytes and kidney  function over weekend     Patient was educated on procedure,  risks and benefits. Risks include  but not limited to infection,  bleeding, MI, CVA, renal failure,  death. The patient  was given  consent information handouts, including  FAQs about surgical site infections.  All questions were answered.  The patient agrees to proceed  with surgery.              Odeal Welden L. Conchita Paris, M.D.   electronically  signed by SarinEr on 06/12/2016  11:51:46 AM with status of Approved     I have reviewed the H and P  and examined the patient and any changes  to the patient from the time  of the original H and P are included  this document.     Catrell Morrone L. Conchita Paris, M.D.    electronically  signed by SarinEr on 06/12/2016  11:53:05 AM with status of  Final

## 2016-06-09 NOTE — Progress Notes (Signed)
[place]    Date Time: 06/09/16 9:13 AM  Patient Name: Jacqueline Weber       Patient Active Problem List   Diagnosis   . Type 2 diabetes mellitus without complication   . HTN (hypertension) urgency   . Hyperlipidemia   . History of CVA (cerebrovascular accident)   . Chest pain, unspecified type   . SOB (shortness of breath)   . Nausea   . Elevated d-dimer   . Hypertensive emergency   . Elevated brain natriuretic peptide (BNP) level   . Hyponatremia   . Renal insufficiency   . Chest pain   . Other chest pain   . NSTEMI (non-ST elevated myocardial infarction)       Subjective:   Denies chest pain, or palpitations. Mild dyspnea with ambulation      Assessment:      Admitted to [facility] with dyspnea, HTNsive urgency, chest pain   Emergent TEE LH was neg for dissection   LHC 06/06/16:severe 3-vessel disease, 90% mid-PDA lesion, 60% RPL lesion, 20% left main lesion, 90% proximal LAD, 50-60% mid LAD, and 80% in diagonal, 90% LCA lesion, EF 70%   [place] groin 06/08/16: No AV fistula or pseudoaneurysm, right groin   Recurrent hypertensive crisis with chest pain, BP improved. No renal artery stenosis by cath on 06/06/16   AKI on CKD 4Scr up to 2.6, slowly improving    Type II MI in setting of hypertensive crisis last week, TnI went up to 8.03 - cath deferred due to renal insufficiency at the time   Abnormal OPnuclear stress test findings, February 2017, nuclear stress test, small-sized, mild intensity inferoapical ischemia, EF 72%   February 2017, echo, EF 70%, concentric LVH, grade I diastolic dysfunction,mild AI    Plan:      Maintain current regimen of coreg 25 BID, catapress 0.1 BID, hydralazine 100 BID, and amlodipine 10 qD    cozaar and use imdur for now.   CV surgery to evaluate   Appreciate neph input. Cr stable.    Medications:     Current Facility-Administered Medications   Medication Dose Route Frequency Provider Last Rate Last Dose   . acetaminophen (TYLENOL) tablet 650  mg  650 mg Oral Q4H PRN Bowman, Cristin Anne, NP       . amLODIPine (NORVASC) tablet 10 mg  10 mg Oral Daily Mardene Sayer, MD       . aspirin EC EC tablet 325 mg  325 mg Oral Daily Ellene Route H, MD       . carvedilol (COREG) tablet 25 mg  25 mg Oral Q12H [facility] Mardene Sayer, MD   25 mg at 06/09/16 0835   . cloNIDine (CATAPRES) tablet 0.1 mg  0.1 mg Oral Q12H [facility] Mardene Sayer, MD   0.1 mg at 06/09/16 0549   . dextrose (GLUCOSE) 40 % oral gel 15 g of glucose  15 g of glucose Oral PRN Mardene Sayer, MD        And   . dextrose (D10W) 10% bolus 125 mL  125 mL Intravenous PRN Mardene Sayer, MD        And   . glucagon (rDNA) (GLUCAGEN) injection 1 mg  1 mg Intramuscular PRN Mardene Sayer, MD       . famotidine (PEPCID) tablet 20 mg  20 mg Oral Daily Mardene Sayer, MD   20 mg at 06/08/16 2001   . heparin (porcine) injection  5,000 Units  5,000 Units Subcutaneous [facility] Mardene Sayer, MD   5,000 Units at 06/09/16 0549   . hydrALAZINE (APRESOLINE) tablet 100 mg  100 mg Oral Q12H [place] Mardene Sayer, MD   100 mg at 06/08/16 2213   . insulin lispro (HumaLOG) injection 1-3 Units  1-3 Units Subcutaneous QHS PRN Mardene Sayer, MD   1 Units at 06/08/16 2208   . insulin lispro (HumaLOG) injection 1-5 Units  1-5 Units Subcutaneous TID AC PRN Mardene Sayer, MD       . losartan (COZAAR) tablet 25 mg  25 mg Oral Daily Mardene Sayer, MD       . naloxone [facility]) injection 0.2 mg  0.2 mg Intravenous PRN Mardene Sayer, MD       . ondansetron [facility]) injection 4 mg  4 mg Intravenous QD PRN Bowman, Maura Crandall, NP       . oxyCODONE-acetaminophen (PERCOCET) 5-325 MG per tablet 1 tablet  1 tablet Oral Q4H PRN Bowman, Maura Crandall, NP       . polyethylene glycol (MIRALAX) packet 17 g  17 g Oral Daily Radene Gunning, NP   17 g at 06/08/16 1856   . senna-docusate (PERICOLACE) 8.6-50 MG per tablet 1 tablet  1 tablet Oral BID Radene Gunning, NP   1 tablet at 06/08/16 1856   . SITagliptin  (JANUVIA) tablet 25 mg  25 mg Oral Daily Mardene Sayer, MD   25 mg at 06/08/16 2106   . traMADol (ULTRAM) tablet 50 mg  50 mg Oral Q4H PRN Radene Gunning, NP           Physical Exam:     Vitals:    06/09/16 0736   BP: 129/61   Pulse: 75   Resp: 20   Temp: 98.3 F (36.8 C)   SpO2: 94%     Temp (24hrs), Avg:97.9 F (36.6 C), Min:97.3 F (36.3 C), Max:98.3 F (36.8 C)      Telemetry reviewed: SR     Intake and Output Summary (Last 24 hours) at Date Time    Intake/Output Summary (Last 24 hours) at 06/09/16 0913  Last data filed at 06/09/16 0631   Gross per 24 hour   Intake              300 ml   Output                0 ml   Net              300 ml       General Appearance:  Breathing comfortably, no acute distress  Head:  Normocephalic  Eyes:  EOM's intact  Neck:  No carotid bruit or jugular venous distension, brisk carotid upstroke  Lungs:  Clear to auscultation throughout, no wheezes, rhonchi or rales, good respiratory effort   Chest Wall:  No tenderness or deformity  Heart:  S1, S2 normal, no S3, no S4, no murmur, PMI not displaced, no rub   Abdomen:  Soft, non-tender, positive bowel sounds, no hepatojugular reflux  Extremities:  No cyanosis, clubbing or edema. R groin site C/D/I, minimally tender  Pulses:  Equal radial pulses, 4/4 symmetric  Neurologic:  Alert and oriented x3, mood and affect normal    Labs:       Recent Labs  Lab 06/08/16  1206   Troponin I 0.03  Recent Labs  Lab 06/04/16  0650   Bilirubin, Total 0.3   Protein, Total 5.7*   Albumin 2.4*   ALT 10   AST (SGOT) 14       Recent Labs  Lab 06/04/16  0650   Magnesium 1.9       Recent Labs  Lab 06/05/16  0552   PT 12.9   PT INR 1.0   PTT 40*       Recent Labs  Lab 06/09/16  0517 06/04/16  0650 06/03/16  0428   WBC 6.23 6.13 5.37   Hgb 10.0* 11.0* 10.2*   Hematocrit 31.3* 34.4* 31.8*   Platelets 227 258 226       Recent Labs  Lab 06/09/16  0517 06/08/16  0526 06/07/16  0632   Sodium 127* 130* 135*   Potassium 4.0 3.9 4.1   Chloride  103 106 110   CO2 18* 16* 16*   BUN 23.0* 19.8* 18.7   Creatinine 2.3* 2.0* 1.9*   EGFR 21.4 25.1 26.6   Glucose 110* 118* 118*   Calcium 8.1* 8.2* 8.2*           Invalid input(s): FREET4    Lab Results   Component Value Date    BNP 504.8 (H) 05/31/2016          [place]:        Signed by: Timmothy Sours MD [place]  NP [place] (8am-5pm)  MD Spectralink [phone] or [phone] (8am-5pm)  After hours, non urgent consult line [phone]

## 2016-06-09 NOTE — Progress Notes (Signed)
MEDICINE PROGRESS NOTE    Date Time: 06/09/16 9:55 AM  Patient Name: Jacqueline Weber  Attending Physician: Margarita Rana, MD    Assessment:   Active Problems:    NSTEMI (non-ST elevated myocardial infarction)    Multivessel CAD    Hypertensive urgency-improved    AKI on CKD     DM        Plan:   Cardiology consult appreciated. Continue asa, BB.   Monitor BP closely, continue coreg, catapress, hydralazine and amlodipine.  CV surgery consult for possible CABG pending  Monitor cr closely, consider nephrology consult.  Monitor serum sodium closely, not volume overloaded clinically.  Continue rest of the medications      Case discussed with: patient and daughter and cardiology    Safety Checklist:     DVT prophylaxis:  CHEST guideline (See page [phone]) Chemical   Foley:  [place] Rn Foley protocol Not present   IVs:  Peripheral IV   PT/OT: Ordered   Daily CBC & or Chem ordered:  SHM/ABIM guidelines (see #5) Yes, due to clinical and lab instability   Reference for approximate charges of common labs: CBC auto diff - $76  BMP - $99  Mg - $79    Lines:     Patient Lines/Drains/Airways Status    Active PICC Line / CVC Line / PIV Line / Drain / Airway / Intraosseous Line / Epidural Line / ART Line / Line / Wound / Pressure Ulcer / NG/OG Tube     Name:   Placement date:   Placement time:   Site:   Days:    Peripheral IV 06/09/16 Right Forearm  06/09/16    0515    Forearm    less than 1    Puncture Site 06/06/16 Femoral Right Groin  06/06/16    0810      3                 Disposition: (Please see PAF column for Expected D/C Date)   Today's date: 06/09/2016  Admit Date: 06/08/2016  4:44 PM  LOS: 1  Clinical Milestones: CAD, needs CABG  Anticipated discharge needs: TBD      Subjective     CC: CABG evaluation    Interval History/24 hour events: none    HPI/Subjective: has some sob on walking earlier today. Currently denies cp or sob or dizziness or palpitations    Review of Systems:     Reviewed other systems and are  negative    Physical Exam:     VITAL SIGNS PHYSICAL EXAM   Temp:  [97.3 F (36.3 C)-98.3 F (36.8 C)] 98.3 F (36.8 C)  Heart Rate:  [71-86] 75  Resp Rate:  [12-20] 20  BP: (121-140)/(58-78) 129/61  Blood Glucose: 110, 131        Intake/Output Summary (Last 24 hours) at 06/09/16 0955  Last data filed at 06/09/16 0631   Gross per 24 hour   Intake              300 ml   Output                0 ml   Net              300 ml    Physical Exam  General: awake, alert, oriented X 3, not in any distress  Cardiovascular: regular rate and rhythm, no murmurs, rubs or gallops  Lungs: clear to auscultation bilaterally, without wheezing, rhonchi, or rales  Abdomen:  soft, non-tender, non-distended; no palpable masses,  normoactive bowel sounds  Extremities: trace pedal edema         Meds:     Medications were reviewed:  Current Facility-Administered Medications   Medication Dose Route Frequency   . amLODIPine  10 mg Oral Daily   . aspirin EC  325 mg Oral Daily   . carvedilol  25 mg Oral Q12H SCH   . cloNIDine  0.1 mg Oral Q12H [place]   . famotidine  20 mg Oral Daily   . heparin (porcine)  5,000 Units Subcutaneous Q8H [place]   . hydrALAZINE  100 mg Oral Q12H SCH   . isosorbide mononitrate  30 mg Oral Daily   . polyethylene glycol  17 g Oral Daily   . senna-docusate  1 tablet Oral BID   . SITagliptin  25 mg Oral Daily         Labs:     Labs (last 72 hours):      Recent Labs  Lab 06/09/16  0517 06/04/16  0650   WBC 6.23 6.13   Hgb 10.0* 11.0*   Hematocrit 31.3* 34.4*   Platelets 227 258         Recent Labs  Lab 06/05/16  0552   PT 12.9   PT INR 1.0   PTT 40*      Recent Labs  Lab 06/09/16  0517 06/08/16  0526  06/04/16  0650   Sodium 127* 130* More results in Results Review 136   Potassium 4.0 3.9 More results in Results Review 4.3   Chloride 103 106 More results in Results Review 108   CO2 18* 16* More results in Results Review 18*   BUN 23.0* 19.8* More results in Results Review 25.8*   Creatinine 2.3* 2.0* More results in Results Review  2.0*   Calcium 8.1* 8.2* More results in Results Review 8.9   Albumin  --   --   --  2.4*   Protein, Total  --   --   --  5.7*   Bilirubin, Total  --   --   --  0.3   Alkaline Phosphatase  --   --   --  58   ALT  --   --   --  10   AST (SGOT)  --   --   --  14   Glucose 110* 118* More results in Results Review 127*   More results in Results Review = values in this interval not displayed.                Microbiology, reviewed and are significant for:  none    Imaging, reviewed and are significant for:  US Carotid Duplex Dopp Comp Bilateral    Result Date: 06/06/2016   Mild to moderate bilateral carotid bulb calcified plaque without hemodynamically significant stenosis. Ferd Hibbs, MD 06/06/2016 5:02 PM     [place] Groin Pseudoaneurysm Right W Dopp    Result Date: 06/08/2016   No evidence of AV fistula or pseudoaneurysm, right groin. Pauline Aus, MD 06/08/2016 3:04 PM           Signed by: Margarita Rana, MD

## 2016-06-09 NOTE — Plan of Care (Addendum)
Problem: Safety  Goal: Patient will be free from injury during hospitalization  Outcome: Progressing   06/09/16 0314   Goal/Interventions addressed this shift   Patient will be free from injury during hospitalization  Assess patient's risk for falls and implement fall prevention plan of care per policy;Use appropriate transfer methods;Include patient/ family/ care giver in decisions related to safety;Hourly rounding       Problem: Hemodynamic Status: Cardiac  Goal: Stable vital signs and fluid balance  Outcome: Progressing   06/09/16 0314   Goal/Interventions addressed this shift   Stable vital signs and fluid balance Monitor/assess vital signs and telemetry per unit protocol;Weigh on admission and record weight daily;Monitor intake/output per unit protocol and/or LIP order;Monitor lab values;Assess signs and symptoms associated with cardiac rhythm changes       Problem: Inadequate Tissue Perfusion  Goal: Adequate tissue perfusion will be maintained  Outcome: Progressing   06/09/16 0314   Goal/Interventions addressed this shift   Adequate tissue perfusion will be maintained Monitor/assess vital signs;Monitor/assess lab values and report abnormal values;Monitor/assess neurovascular status (pulses, capillary refill, pain, paresthesia, paralysis, presence of edema);Increase mobility as tolerated/progressive mobility;Assess and monitor skin integrity       Comments: Assumed care at 1900. A&Ox4. NS on the monitor. VS stable. Denies CP/ SOB/ dizziness. Up with standby assistance. Fall precautions in place. NPO at midnight for possible CABG workup. R groin puncture CDI. Surrounding area soft yet tender ultrasound revealed a potential small hematoma.  Hourly rounding occurred.     Plan: New IV to be placed today, monitor VS, monitor lab values, continue SQ heparin

## 2016-06-09 NOTE — Progress Notes (Signed)
06/09/16 1200   Spirometry Pre and/or Post   Procedure Location and Type Bedside- Pre Only   Pre FVC 1.66 L   Pre FEV1 1.2 L   Pre FEV1/FVC % (Calculated) 72   Patient follows commands Yes   Cough during exhalation No   Early termination No   Leak No   Variable effort No   2 FVC 150 ml of each other Yes   2 FEV1 150 ml of each other Yes   3 maneuvers performed Yes   Patient Effort Good   Adverse Reactions None   Primary Charges   $ Spirometry Type Performed Simple - CPT 94010

## 2016-06-09 NOTE — Progress Notes (Signed)
[place] department updated of pt's transfer to [facility]. RN transport to be scheduled to pick up patient from new unit/room. Receiving Rn [facility] aware.

## 2016-06-10 ENCOUNTER — Inpatient Hospital Stay: Payer: Medicare Other

## 2016-06-10 LAB — PHOSPHORUS: Phosphorus: 4.3 mg/dL (ref 2.3–4.7)

## 2016-06-10 LAB — GLUCOSE WHOLE BLOOD - POCT
Whole Blood Glucose POCT: 101 mg/dL — ABNORMAL HIGH (ref 70–100)
Whole Blood Glucose POCT: 147 mg/dL — ABNORMAL HIGH (ref 70–100)
Whole Blood Glucose POCT: 152 mg/dL — ABNORMAL HIGH (ref 70–100)
Whole Blood Glucose POCT: 117 mg/dL — ABNORMAL HIGH (ref 70–100)

## 2016-06-10 LAB — CBC
Absolute NRBC: 0 10*3/uL
Hematocrit: 29 % — ABNORMAL LOW (ref 37.0–47.0)
Hgb: 9.2 g/dL — ABNORMAL LOW (ref 12.0–16.0)
MCH: 21.6 pg — ABNORMAL LOW (ref 28.0–32.0)
MCHC: 31.7 g/dL — ABNORMAL LOW (ref 32.0–36.0)
MCV: 68.2 fL — ABNORMAL LOW (ref 80.0–100.0)
MPV: 11.6 fL (ref 9.4–12.3)
Nucleated RBC: 0 /100 WBC (ref 0.0–1.0)
Platelets: 215 10*3/uL (ref 140–400)
RBC: 4.25 10*6/uL (ref 4.20–5.40)
RDW: 18 % — ABNORMAL HIGH (ref 12–15)
WBC: 5.58 10*3/uL (ref 3.50–10.80)

## 2016-06-10 LAB — BASIC METABOLIC PANEL
BUN: 23 mg/dL — ABNORMAL HIGH (ref 7.0–19.0)
CO2: 14 mEq/L — ABNORMAL LOW (ref 22–29)
Calcium: 8.1 mg/dL — ABNORMAL LOW (ref 8.5–10.5)
Chloride: 103 mEq/L (ref 100–111)
Creatinine: 2.4 mg/dL — ABNORMAL HIGH (ref 0.6–1.0)
Glucose: 89 mg/dL (ref 70–100)
Potassium: 4 mEq/L (ref 3.5–5.1)
Sodium: 127 mEq/L — ABNORMAL LOW (ref 136–145)

## 2016-06-10 LAB — IRON PROFILE
Iron Saturation: 22 % (ref 15–50)
Iron: 41 ug/dL (ref 40–145)
TIBC: 185 ug/dL — ABNORMAL LOW (ref 265–497)
UIBC: 144 ug/dL (ref 126–382)

## 2016-06-10 LAB — MAGNESIUM: Magnesium: 1.6 mg/dL (ref 1.6–2.6)

## 2016-06-10 LAB — FERRITIN: Ferritin: 154.39 ng/mL (ref 4.60–204.00)

## 2016-06-10 LAB — HEMOLYSIS INDEX: Hemolysis Index: 13 (ref 0–18)

## 2016-06-10 LAB — GFR: EGFR: 20.3

## 2016-06-10 MED ORDER — SODIUM BICARBONATE 650 MG PO TABS
1300.0000 mg | ORAL_TABLET | Freq: Four times a day (QID) | ORAL | Status: AC
Start: 2016-06-10 — End: 2016-06-11
  Administered 2016-06-10 – 2016-06-11 (×7): 1300 mg via ORAL
  Filled 2016-06-10 (×8): qty 2

## 2016-06-10 MED ORDER — IRON SUCROSE 20 MG/ML IV SOLN
200.0000 mg | INTRAVENOUS | Status: DC
Start: 2016-06-10 — End: 2016-06-12
  Administered 2016-06-10 – 2016-06-11 (×2): 200 mg via INTRAVENOUS
  Filled 2016-06-10 (×2): qty 10

## 2016-06-10 NOTE — Plan of Care (Addendum)
Problem: Safety  Goal: Patient will be free from injury during hospitalization  Outcome: Progressing    Goal: Patient will be free from infection during hospitalization  Outcome: Progressing      Problem: Pain  Goal: Pain at adequate level as identified by patient  Outcome: Completed Date Met: 06/10/16      Problem: Discharge Barriers  Goal: Patient will be discharged home or other facility with appropriate resources  Outcome: Progressing      Problem: Psychosocial and Spiritual Needs  Goal: Demonstrates ability to cope with hospitalization/illness  Outcome: Completed Date Met: 06/10/16      Problem: Hemodynamic Status: Cardiac  Goal: Stable vital signs and fluid balance  Outcome: Progressing      Problem: Inadequate Tissue Perfusion  Goal: Adequate tissue perfusion will be maintained  Outcome: Completed Date Met: 06/10/16      Problem: Nutrition  Goal: Nutritional intake is adequate  Outcome: Progressing      Problem: Moderate/High Fall Risk Score >5  Goal: Patient will remain free of falls  Outcome: Progressing      Problem: Pre-op Phase - Cardiac Surgery  Goal: Patient will be ready for surgery  Outcome: Progressing      Problem: Renal Instability  Goal: Fluid and electrolyte balance are achieved/maintained  Outcome: Progressing    Goal: Perineal skin integrity is maintained or improved  Outcome: Completed Date Met: 06/10/16    Goal: Free from infection  Outcome: Progressing    Goal: Nutritional intake is adequate  Outcome: Progressing      Pt A&Ox4, mainly laotian speaking: family at bedside to translate, waiver is signed and in chart. SBP 130-150s, SR w/ 1st degree AVB on tele in the 60-70s, SpO2 98% on RA, afebrile. Tolerating diet, no N/V. BMx1 this shift, voiding freely in toilet. OOB with standby assist, family at bedside. NS infusing at 72mL/hr. RIght groin incision, soft, CDI. No complaints of pain. Pre op for CABG, [place] of LE done today, surgery tentatively scheduled for Monday. Fall mat in place, call  light within reach. Will continue to monitor.

## 2016-06-10 NOTE — Progress Notes (Signed)
[facility]  Progress Note    Assessment:  1. Subacute on CRF; non oliguric (overall rise in serum creatinine from 1/17 -> 9/17)  2. ASCAD; transfer from [facility] for CABG  3. Metabolic acidosis; non anion gap  4. Anemia; microcytic/hypochromic  5. MMPs    Plan:  1.  Titrate NaHCO3 to 1300mg  po qid at present  2.  NS @ 50cc/hr  3.  CXR (AP) 10/6 reviewed  4.  Tapering antihypertensive regimen with tighter hold parameters; avoid overcontrol of BP - current stable hemodynamics  5.  qam labs to include Mf/PO4    Further recommendations to follow    Thank you    Elmyra Ricks, MD  Office - [phone]  Spectra Link - [phone]  ++++++++++++++++++++++++++++++++++++++++++++++++++++++++++++++  Subjective:  No new complaints    Medications:  Scheduled Meds:  Current Facility-Administered Medications   Medication Dose Route Frequency   . [START ON 06/11/2016] amiodarone  400 mg Oral Q12H   . amLODIPine  5 mg Oral Daily   . aspirin EC  325 mg Oral Daily   . carvedilol  12.5 mg Oral Q12H [place]   . chlorhexidine  15 mL Mouth/Throat Pre-Op   . cloNIDine  0.1 mg Oral Q12H [place]   . famotidine  20 mg Oral Daily   . heparin (porcine)  5,000 Units Subcutaneous Q8H [place]   . hydrALAZINE  50 mg Oral Q12H [place]   . iron sucrose  200 mg Intravenous Q24H [facility]   . isosorbide mononitrate  30 mg Oral Daily   . mupirocin   Nasal Pre-Op   . polyethylene glycol  17 g Oral Daily   . senna-docusate  1 tablet Oral BID   . SITagliptin  25 mg Oral Daily   . sodium bicarbonate  1,300 mg Oral QID     Continuous Infusions:  . sodium chloride 50 mL/hr at 06/09/16 1726     PRN Meds:acetaminophen, Nursing communication: Adult Hypoglycemia Treatment Algorithm **AND** dextrose **AND** dextrose **AND** glucagon (rDNA) **AND** Nursing communication: Document Significant Event Note, insulin lispro, insulin lispro, naloxone, oxyCODONE-acetaminophen, traMADol    Objective:  Vital signs in last 24 hours:  Temp:  [97.5 F (36.4  C)-98.2 F (36.8 C)] 97.6 F (36.4 C)  Heart Rate:  [66-80] 70  Resp Rate:  [18] 18  BP: (125-159)/(61-73) 136/65  Intake/Output last 24 hours:    Intake/Output Summary (Last 24 hours) at 06/10/16 1256  Last data filed at 06/10/16 0500   Gross per 24 hour   Intake              850 ml   Output                0 ml   Net              850 ml     Intake/Output this shift:  No intake/output data recorded.    Physical Exam:   Gen: Alert/pleasant; eating lunch   CV: S1 S2 N RRR   Chest: No rales/no rhonchi   Ab: ND NT soft no HSM +BS   Ext: No edema    Labs:    Recent Labs  Lab 06/10/16  0527 06/09/16  0517 06/08/16  0526  06/04/16  0650   Glucose 89 110* 118* More results in Results Review 127*   BUN 23.0* 23.0* 19.8* More results in Results Review 25.8*   Creatinine 2.4* 2.3* 2.0* More results in Results Review 2.0*  Calcium 8.1* 8.1* 8.2* More results in Results Review 8.9   Sodium 127* 127* 130* More results in Results Review 136   Potassium 4.0 4.0 3.9 More results in Results Review 4.3   Chloride 103 103 106 More results in Results Review 108   CO2 14* 18* 16* More results in Results Review 18*   Albumin  --   --   --   --  2.4*   Phosphorus 4.3  --   --   --   --    Magnesium 1.6  --   --   --  1.9   More results in Results Review = values in this interval not displayed.    Recent Labs  Lab 06/10/16  0527 06/09/16  0517 06/04/16  0650   WBC 5.58 6.23 6.13   Hgb 9.2* 10.0* 11.0*   Hematocrit 29.0* 31.3* 34.4*   MCV 68.2* 68.9* 69.8*   MCH 21.6* 22.0* 22.3*   MCHC 31.7* 31.9* 32.0   RDW 18* 18* 18*   MPV 11.6 10.8 10.5   Platelets 215 227 258

## 2016-06-10 NOTE — Progress Notes (Signed)
MEDICINE PROGRESS NOTE    Date Time: 06/10/16 4:19 PM  Patient Name: Jacqueline Weber  Attending Physician: Mardene Sayer, MD    Assessment:   Active Problems:    NSTEMI (non-ST elevated myocardial infarction)    Multivessel CAD    Hypertensive urgency-improved    AKI on CKD     DM        Patient is a 65 years old female with h/o HTN, type II DM, and CKD admitted to [facility] with hypertensive crisis and chest pain. She had heart cath showing multivessel disease. She was sent to [facility] for CABG evaluation. She was also noted to have acute on chronic renal disease. Nephrology has been following kidney function closely. CABG was rescheduled for Monday.     Plan:   Cardiology consult appreciated. Continue cardiac meds  Monitor BP closely, continue coreg, catapress, hydralazine and amlodipine.  CV surgery consult for possible CABG. Rescheduled for Monday, Pending kidney function  Monitor cr closely, consider nephrology consult.  Monitor serum sodium closely, not volume overloaded clinically.  Continue rest of the medications      Case discussed with: patient and daughter and cardiology    Safety Checklist:     DVT prophylaxis:  CHEST guideline (See page [phone]) Chemical   Foley:  [place] Rn Foley protocol Not present   IVs:  Peripheral IV   PT/OT: Ordered   Daily CBC & or Chem ordered:  SHM/ABIM guidelines (see #5) Yes, due to clinical and lab instability   Reference for approximate charges of common labs: CBC auto diff - $76  BMP - $99  Mg - $79    Lines:     Patient Lines/Drains/Airways Status    Active PICC Line / CVC Line / PIV Line / Drain / Airway / Intraosseous Line / Epidural Line / ART Line / Line / Wound / Pressure Ulcer / NG/OG Tube     Name:   Placement date:   Placement time:   Site:   Days:    Peripheral IV 06/09/16 Right Forearm  06/09/16    0515    Forearm    less than 1    Puncture Site 06/06/16 Femoral Right Groin  06/06/16    0810      3                 Disposition: (Please see PAF column for  Expected D/C Date)   Today's date: 06/10/2016  Admit Date: 06/08/2016  4:44 PM  LOS: 2  Clinical Milestones: CAD, needs CABG  Anticipated discharge needs: TBD      Subjective     CC: CABG evaluation    Interval History/24 hour events: none    HPI/Subjective:   Patient is accompanied by son. She is doing well this AM. Has no new complaints.    Review of Systems:     Reviewed other systems and are negative    Physical Exam:     VITAL SIGNS PHYSICAL EXAM   Temp:  [97.6 F (36.4 C)-98.2 F (36.8 C)] 97.6 F (36.4 C)  Heart Rate:  [66-80] 70  Resp Rate:  [18] 18  BP: (125-159)/(61-73) 136/65    Intake/Output Summary (Last 24 hours) at 06/10/16 1619  Last data filed at 06/10/16 0500   Gross per 24 hour   Intake              850 ml   Output  0 ml   Net              850 ml    Physical Exam  General: AAO*3, NAD  Cardiovascular: regular rate and rhythm, no murmurs, rubs or gallops  Lungs: clear to auscultation bilaterally, without wheezing, rhonchi, or rales  Abdomen: soft, non-tender, non-distended; normoactive bowel sounds  Extremities: no pitting edema, moves all 4 EXTs         Meds:     Medications were reviewed:  Current Facility-Administered Medications   Medication Dose Route Frequency   . [START ON 06/11/2016] amiodarone  400 mg Oral Q12H   . amLODIPine  5 mg Oral Daily   . aspirin EC  325 mg Oral Daily   . carvedilol  12.5 mg Oral Q12H [place]   . chlorhexidine  15 mL Mouth/Throat Pre-Op   . cloNIDine  0.1 mg Oral Q12H [place]   . famotidine  20 mg Oral Daily   . heparin (porcine)  5,000 Units Subcutaneous Q8H [place]   . hydrALAZINE  50 mg Oral Q12H [place]   . iron sucrose  200 mg Intravenous Q24H [place]   . isosorbide mononitrate  30 mg Oral Daily   . mupirocin   Nasal Pre-Op   . polyethylene glycol  17 g Oral Daily   . senna-docusate  1 tablet Oral BID   . SITagliptin  25 mg Oral Daily   . sodium bicarbonate  1,300 mg Oral QID         Labs:     Labs (last 72 hours):      Recent Labs  Lab 06/10/16  0527  06/09/16  0517   WBC 5.58 6.23   Hgb 9.2* 10.0*   Hematocrit 29.0* 31.3*   Platelets 215 227         Recent Labs  Lab 06/05/16  0552   PT 12.9   PT INR 1.0   PTT 40*      Recent Labs  Lab 06/10/16  0527 06/09/16  0517  06/04/16  0650   Sodium 127* 127* More results in Results Review 136   Potassium 4.0 4.0 More results in Results Review 4.3   Chloride 103 103 More results in Results Review 108   CO2 14* 18* More results in Results Review 18*   BUN 23.0* 23.0* More results in Results Review 25.8*   Creatinine 2.4* 2.3* More results in Results Review 2.0*   Calcium 8.1* 8.1* More results in Results Review 8.9   Albumin  --   --   --  2.4*   Protein, Total  --   --   --  5.7*   Bilirubin, Total  --   --   --  0.3   Alkaline Phosphatase  --   --   --  58   ALT  --   --   --  10   AST (SGOT)  --   --   --  14   Glucose 89 110* More results in Results Review 127*   More results in Results Review = values in this interval not displayed.                Microbiology, reviewed and are significant for:  none    Imaging, reviewed and are significant for:  Xr Chest Ap Portable    Result Date: 06/09/2016   No acute process. Nicholes Rough, MD 06/09/2016 5:59 PM     US Carotid Duplex Dopp Comp Bilateral  Result Date: 06/06/2016   Mild to moderate bilateral carotid bulb calcified plaque without hemodynamically significant stenosis. Ferd Hibbs, MD 06/06/2016 5:02 PM     [place] Groin Pseudoaneurysm Right W Dopp    Result Date: 06/08/2016   No evidence of AV fistula or pseudoaneurysm, right groin. Pauline Aus, MD 06/08/2016 3:04 PM     US Venous Low Extrem Duplex Dopp Ltd Bilat    Result Date: 06/10/2016  1. Widely patent bilateral great and small saphenous veins, no evidence of superficial thrombosis. Mild diffuse wall thickening of the small saphenous veins bilaterally. 2. Vein measurements as above. Hurley Cisco, MD 06/10/2016 11:38 AM           Signed by: Mardene Sayer, MD

## 2016-06-10 NOTE — Plan of Care (Signed)
Problem: Safety  Goal: Patient will be free from injury during hospitalization  Outcome: Progressing   06/10/16 0101   Goal/Interventions addressed this shift   Patient will be free from injury during hospitalization  Assess patient's risk for falls and implement fall prevention plan of care per policy;Provide and maintain safe environment;Use appropriate transfer methods;Ensure appropriate safety devices are available at the bedside;Include patient/ family/ care giver in decisions related to safety;Hourly rounding       Problem: Pain  Goal: Pain at adequate level as identified by patient  Outcome: Progressing   06/10/16 0101   Goal/Interventions addressed this shift   Pain at adequate level as identified by patient Identify patient comfort function goal;Assess pain on admission, during daily assessment and/or before any "as needed" intervention(s);Reassess pain within 30-60 minutes of any procedure/intervention, per Pain Assessment, Intervention, Reassessment (AIR) Cycle;Evaluate if patient comfort function goal is met;Evaluate patient's satisfaction with pain management progress;Offer non-pharmacological pain management interventions       Problem: Pre-op Phase - Cardiac Surgery  Goal: Patient will be ready for surgery  Outcome: Progressing   06/10/16 0101   Goal/Interventions addressed this shift   Patient will be ready for surgery Document height and weight;CHG shower/bath the night before and morning of surgery;Ensure all pre-op orders are complete;NPO after midnight;Blood type and crossmatch completed;Complete pre-op checklist       Comments: Patient alert and oriented times three, vital signs stable, tele sinus rhythm heart rate in the 70's. Patient remains on IVF normal saline infusing at 50cc/hr. Denies any chest pain or discomfort at this time. Will continue to monitor by hourly rounding, call bell placed within patient's reach.

## 2016-06-11 LAB — URINALYSIS WITH MICROSCOPIC
Bilirubin, UA: NEGATIVE
Glucose, UA: 50 — AB
Ketones UA: NEGATIVE
Nitrite, UA: NEGATIVE
Protein, UR: 100 — AB
Specific Gravity UA: 1.005 (ref 1.001–1.035)
Urine pH: 7 (ref 5.0–8.0)
Urobilinogen, UA: NORMAL mg/dL

## 2016-06-11 LAB — COLD AGGLUTININ SCREEN: Cold Screen: POSITIVE

## 2016-06-11 LAB — CBC
Absolute NRBC: 0 10*3/uL
Hematocrit: 27.4 % — ABNORMAL LOW (ref 37.0–47.0)
Hgb: 8.8 g/dL — ABNORMAL LOW (ref 12.0–16.0)
MCH: 21.7 pg — ABNORMAL LOW (ref 28.0–32.0)
MCHC: 32.1 g/dL (ref 32.0–36.0)
MCV: 67.7 fL — ABNORMAL LOW (ref 80.0–100.0)
MPV: 10.9 fL (ref 9.4–12.3)
Nucleated RBC: 0 /100 WBC (ref 0.0–1.0)
Platelets: 206 10*3/uL (ref 140–400)
RBC: 4.05 10*6/uL — ABNORMAL LOW (ref 4.20–5.40)
RDW: 18 % — ABNORMAL HIGH (ref 12–15)
WBC: 5.11 10*3/uL (ref 3.50–10.80)

## 2016-06-11 LAB — GLUCOSE WHOLE BLOOD - POCT
Whole Blood Glucose POCT: 107 mg/dL — ABNORMAL HIGH (ref 70–100)
Whole Blood Glucose POCT: 136 mg/dL — ABNORMAL HIGH (ref 70–100)
Whole Blood Glucose POCT: 161 mg/dL — ABNORMAL HIGH (ref 70–100)
Whole Blood Glucose POCT: 123 mg/dL — ABNORMAL HIGH (ref 70–100)

## 2016-06-11 LAB — BASIC METABOLIC PANEL
BUN: 22 mg/dL — ABNORMAL HIGH (ref 7.0–19.0)
CO2: 16 mEq/L — ABNORMAL LOW (ref 22–29)
Calcium: 8 mg/dL — ABNORMAL LOW (ref 8.5–10.5)
Chloride: 105 mEq/L (ref 100–111)
Creatinine: 2.1 mg/dL — ABNORMAL HIGH (ref 0.6–1.0)
Glucose: 103 mg/dL — ABNORMAL HIGH (ref 70–100)
Potassium: 4 mEq/L (ref 3.5–5.1)
Sodium: 130 mEq/L — ABNORMAL LOW (ref 136–145)

## 2016-06-11 LAB — TYPE AND SCREEN
AB Screen Gel: NEGATIVE
ABO Rh: O POS

## 2016-06-11 LAB — GFR: EGFR: 23.7

## 2016-06-11 LAB — PHOSPHORUS: Phosphorus: 3.9 mg/dL (ref 2.3–4.7)

## 2016-06-11 LAB — MAGNESIUM: Magnesium: 1.5 mg/dL — ABNORMAL LOW (ref 1.6–2.6)

## 2016-06-11 MED ORDER — HEPARIN SODIUM (PORCINE) 5000 UNIT/ML IJ SOLN
5000.0000 [IU] | Freq: Three times a day (TID) | INTRAMUSCULAR | Status: AC
Start: 2016-06-11 — End: 2016-06-11
  Administered 2016-06-11: 5000 [IU] via SUBCUTANEOUS
  Filled 2016-06-11: qty 1

## 2016-06-11 MED ORDER — CARVEDILOL 25 MG PO TABS
25.0000 mg | ORAL_TABLET | Freq: Two times a day (BID) | ORAL | Status: DC
Start: 2016-06-11 — End: 2016-06-12
  Administered 2016-06-11: 25 mg via ORAL
  Filled 2016-06-11: qty 4

## 2016-06-11 MED ORDER — HYDRALAZINE HCL 20 MG/ML IJ SOLN
5.0000 mg | Freq: Once | INTRAMUSCULAR | Status: AC
Start: 2016-06-11 — End: 2016-06-11
  Administered 2016-06-11: 5 mg via INTRAVENOUS
  Filled 2016-06-11: qty 1

## 2016-06-11 MED ORDER — AMLODIPINE BESYLATE 5 MG PO TABS
10.0000 mg | ORAL_TABLET | Freq: Every day | ORAL | Status: DC
Start: 2016-06-11 — End: 2016-06-12
  Administered 2016-06-11: 10 mg via ORAL
  Filled 2016-06-11: qty 2

## 2016-06-11 MED ORDER — CLONIDINE HCL 0.1 MG PO TABS
0.1000 mg | ORAL_TABLET | Freq: Two times a day (BID) | ORAL | Status: DC | PRN
Start: 2016-06-11 — End: 2016-06-13

## 2016-06-11 MED ORDER — HYDRALAZINE HCL 25 MG PO TABS
50.0000 mg | ORAL_TABLET | Freq: Three times a day (TID) | ORAL | Status: DC
Start: 2016-06-11 — End: 2016-06-12
  Administered 2016-06-11 – 2016-06-12 (×3): 50 mg via ORAL
  Filled 2016-06-11 (×2): qty 2

## 2016-06-11 MED ORDER — ISOSORBIDE MONONITRATE ER 60 MG PO TB24
60.0000 mg | ORAL_TABLET | Freq: Every day | ORAL | Status: DC
Start: 2016-06-11 — End: 2016-06-12
  Administered 2016-06-11: 60 mg via ORAL
  Filled 2016-06-11: qty 1

## 2016-06-11 MED ORDER — MAGNESIUM SULFATE IN D5W 1-5 GM/100ML-% IV SOLN
1.0000 g | INTRAVENOUS | Status: AC
Start: 2016-06-11 — End: 2016-06-11
  Administered 2016-06-11 (×2): 1 g via INTRAVENOUS
  Filled 2016-06-11 (×2): qty 100

## 2016-06-11 MED ORDER — HYDRALAZINE HCL 20 MG/ML IJ SOLN
10.0000 mg | Freq: Four times a day (QID) | INTRAMUSCULAR | Status: DC | PRN
Start: 2016-06-11 — End: 2016-06-12
  Administered 2016-06-11: 10 mg via INTRAVENOUS
  Filled 2016-06-11: qty 1

## 2016-06-11 NOTE — Progress Notes (Signed)
MEDICINE PROGRESS NOTE    Date Time: 06/11/16 10:37 AM  Patient Name: Jacqueline Weber  Attending Physician: Mardene Sayer, MD    Assessment:   Active Problems:    NSTEMI (non-ST elevated myocardial infarction)    Multivessel CAD    Hypertensive urgency-improved    AKI on CKD     DM        Patient is a 65 years old female with h/o HTN, type II DM, and CKD admitted to [facility] with hypertensive crisis and chest pain. She had heart cath showing multivessel disease. She was sent to [facility] for CABG evaluation. She was also noted to have acute on chronic renal disease. Nephrology has been following kidney function closely. CABG was rescheduled for Monday.     Plan:   Cardiology consult appreciated. Continue cardiac meds  Monitor BP closely, continue coreg, catapress, hydralazine and amlodipine.  CV surgery consult for CABG - plan for Monday.   Monitor cr closely, consider nephrology consult. Kidney function improving  Monitor serum sodium closely, not volume overloaded clinically.  Continue rest of the medications      Case discussed with: patient and daughter and cardiology    Safety Checklist:     DVT prophylaxis:  CHEST guideline (See page [phone]) Chemical   Foley:  [place] Rn Foley protocol Not present   IVs:  Peripheral IV   PT/OT: Ordered   Daily CBC & or Chem ordered:  SHM/ABIM guidelines (see #5) Yes, due to clinical and lab instability   Reference for approximate charges of common labs: CBC auto diff - $76  BMP - $99  Mg - $79    Lines:     Patient Lines/Drains/Airways Status    Active PICC Line / CVC Line / PIV Line / Drain / Airway / Intraosseous Line / Epidural Line / ART Line / Line / Wound / Pressure Ulcer / NG/OG Tube     Name:   Placement date:   Placement time:   Site:   Days:    Peripheral IV 06/09/16 Right Forearm  06/09/16    0515    Forearm    less than 1    Puncture Site 06/06/16 Femoral Right Groin  06/06/16    0810      3                 Disposition: (Please see PAF column for Expected D/C  Date)   Today's date: 06/11/2016  Admit Date: 06/08/2016  4:44 PM  LOS: 3  Clinical Milestones: CAD, needs CABG  Anticipated discharge needs: TBD      Subjective     CC: CABG evaluation    Interval History/24 hour events: none    HPI/Subjective:   Doing well today. Has no new concerns. Plan for surgery tomorrow     Review of Systems:     Reviewed other systems and are negative    Physical Exam:     VITAL SIGNS PHYSICAL EXAM   Temp:  [97.3 F (36.3 C)-98 F (36.7 C)] 97.8 F (36.6 C)  Heart Rate:  [70-83] 83  Resp Rate:  [16-20] 18  BP: (136-201)/(63-101) 166/72    Intake/Output Summary (Last 24 hours) at 06/11/16 1037  Last data filed at 06/10/16 2100   Gross per 24 hour   Intake              800 ml   Output  400 ml   Net              400 ml    Physical Exam  General: AAO*3, NAD  Cardiovascular: regular rate and rhythm, no murmurs, rubs or gallops  Lungs: clear to auscultation bilaterally, without wheezing, rhonchi, or rales  Abdomen: soft, non-tender, non-distended; normoactive bowel sounds  Extremities: no pitting edema, moves all 4 EXTs         Meds:     Medications were reviewed:  Current Facility-Administered Medications   Medication Dose Route Frequency   . amiodarone  400 mg Oral Q12H   . amLODIPine  10 mg Oral Daily   . aspirin EC  325 mg Oral Daily   . carvedilol  25 mg Oral Q12H [place]   . chlorhexidine  15 mL Mouth/Throat Pre-Op   . famotidine  20 mg Oral Daily   . heparin (porcine)  5,000 Units Subcutaneous Q8H [place]   . hydrALAZINE  50 mg Oral Q8H [place]   . iron sucrose  200 mg Intravenous Q24H SCH   . isosorbide mononitrate  60 mg Oral Daily   . mupirocin   Nasal Pre-Op   . polyethylene glycol  17 g Oral Daily   . senna-docusate  1 tablet Oral BID   . SITagliptin  25 mg Oral Daily   . sodium bicarbonate  1,300 mg Oral QID         Labs:     Labs (last 72 hours):      Recent Labs  Lab 06/11/16  0031 06/10/16  0527   WBC 5.11 5.58   Hgb 8.8* 9.2*   Hematocrit 27.4* 29.0*   Platelets 206 215        Recent Labs  Lab 06/05/16  0552   PT 12.9   PT INR 1.0   PTT 40*      Recent Labs  Lab 06/11/16  0031 06/10/16  0527   Sodium 130* 127*   Potassium 4.0 4.0   Chloride 105 103   CO2 16* 14*   BUN 22.0* 23.0*   Creatinine 2.1* 2.4*   Calcium 8.0* 8.1*   Glucose 103* 89                   Microbiology, reviewed and are significant for:  none    Imaging, reviewed and are significant for:  Xr Chest Ap Portable    Result Date: 06/09/2016   No acute process. Nicholes Rough, MD 06/09/2016 5:59 PM     US Carotid Duplex Dopp Comp Bilateral    Result Date: 06/06/2016   Mild to moderate bilateral carotid bulb calcified plaque without hemodynamically significant stenosis. Ferd Hibbs, MD 06/06/2016 5:02 PM     [place] Groin Pseudoaneurysm Right W Dopp    Result Date: 06/08/2016   No evidence of AV fistula or pseudoaneurysm, right groin. Pauline Aus, MD 06/08/2016 3:04 PM     US Venous Low Extrem Duplex Dopp Ltd Bilat    Result Date: 06/10/2016  1. Widely patent bilateral great and small saphenous veins, no evidence of superficial thrombosis. Mild diffuse wall thickening of the small saphenous veins bilaterally. 2. Vein measurements as above. Hurley Cisco, MD 06/10/2016 11:38 AM           Signed by: Mardene Sayer, MD

## 2016-06-11 NOTE — Plan of Care (Signed)
Problem: Safety  Goal: Patient will be free from injury during hospitalization  Outcome: Progressing   06/11/16 0007   Goal/Interventions addressed this shift   Patient will be free from injury during hospitalization  Assess patient's risk for falls and implement fall prevention plan of care per policy;Provide and maintain safe environment;Use appropriate transfer methods;Ensure appropriate safety devices are available at the bedside;Hourly rounding       Problem: Hemodynamic Status: Cardiac  Goal: Stable vital signs and fluid balance  Outcome: Progressing   06/11/16 0007   Goal/Interventions addressed this shift   Stable vital signs and fluid balance Monitor/assess vital signs and telemetry per unit protocol;Weigh on admission and record weight daily;Assess signs and symptoms associated with cardiac rhythm changes       Comments:   Pt a/ox4 vss fc mae ambulates to BR with SB assist  On room air sats upper 90s SBP 150s SR with 1AVB hr 80s  Prefers to keep privacy refused staff to go in with pt in the BR  ivf ns running at 50 cc/hr  [place] speaking only, pt agreed for family member to translate  Voids with no difficulty, reported a bm 10/7  Needs attended to hourly rounding done will continue to monitor pt

## 2016-06-11 NOTE — Progress Notes (Addendum)
[facility]      Date Time: 06/11/16 9:27 AM  Patient Name: Jacqueline Weber, Jacqueline Weber  Medical Record #:  [id number]  Account#:  [id number]  Admission Date:  06/08/2016         Patient Active Problem List   Diagnosis   . Type 2 diabetes mellitus without complication   . HTN (hypertension) urgency   . Hyperlipidemia   . History of CVA (cerebrovascular accident)   . Chest pain, unspecified type   . SOB (shortness of breath)   . Nausea   . Elevated d-dimer   . Hypertensive emergency   . Elevated brain natriuretic peptide (BNP) level   . Hyponatremia   . Renal insufficiency   . Chest pain   . Other chest pain   . NSTEMI (non-ST elevated myocardial infarction)       Subjective:   Denies chest pain, SOB or palpitations.    Assessment:    LHC on 06/06/16 showing severe 3-vessel disease s/p CABG x3   ? EF 70%  ? 90% mid-PDA lesion, 60% RPL lesion,   ? 20% left main lesion, 90% proximal LAD, 50-60% mid LAD, and 80% in diagonal  ? 90% LCA lesion   Recurrent hypertensive crisis  ? No renal artery stenosis by cath on 06/06/16   Widened mediastinum in CXR - TEE neg for dissection   TEE 05/31/16 EF 70% aortic dissection ruled out   AKI on CKD 4 CR up to 2.6, improving    Type II MI in setting of hypertensive crisis last week, TnI went up to 8.03 - cath deferred due to renal insufficiency   Abnormal OPnuclear stress test findings, February 2017, nuclear stress test, small-sized, mild intensity inferoapical ischemia, EF 72%.   February 2017, echo, EF 70%, concentric LVH, grade I diastolic dysfunction, mild AI      Recommendations:    BP's still elevated increase hydralazine to TID dosing, continue coreg, clonidine and norvasc   Cr improving to 2.1 today, nephrology on board   For CABG planned for tomorrow AM.     Medications:      Scheduled Meds:      amiodarone 400 mg Oral Q12H   amLODIPine 10 mg Oral Daily   aspirin EC 325 mg Oral Daily   carvedilol 12.5 mg Oral Q12H SCH   chlorhexidine 15 mL  Mouth/Throat Pre-Op   cloNIDine 0.1 mg Oral Q12H [place]   famotidine 20 mg Oral Daily   heparin (porcine) 5,000 Units Subcutaneous Q8H [place]   hydrALAZINE 50 mg Oral Q12H [place]   iron sucrose 200 mg Intravenous Q24H [place]   isosorbide mononitrate 60 mg Oral Daily   mupirocin  Nasal Pre-Op   polyethylene glycol 17 g Oral Daily   senna-docusate 1 tablet Oral BID   SITagliptin 25 mg Oral Daily   sodium bicarbonate 1,300 mg Oral QID       Continuous Infusions:  . sodium chloride 50 mL/hr at 06/11/16 0332            Physical Exam:       VITAL SIGNS PHYSICAL EXAM   Vitals:    06/11/16 0723   BP: 166/72   Pulse: 83   Resp: 18   Temp: 97.8 F (36.6 C)   SpO2: 96%     Temp (24hrs), Avg:97.6 F (36.4 C), Min:97.3 F (36.3 C), Max:98 F (36.7 C)      Telemetry: SR      Intake/Output Summary (Last 24 hours)  at 06/11/16 0927  Last data filed at 06/10/16 2100   Gross per 24 hour   Intake              800 ml   Output              400 ml   Net              400 ml    Physical Exam  General: awake, alert, breathing comfortably, no acute distress  Head: normocephalic  Eyes: EOM's intact  Cardiovascular: regular rate and rhythm, normal S1, S2, no S3, no S4, no murmurs, rubs or gallops  Neck: no carotid bruits or JVD  Lungs: clear to auscultation bilateraly, without wheezing, rhonchi, or rales  Abdomen: soft, non-tender, non-distended; no palpable masses,  normoactive bowel sounds  Extremities: no edema  Pulse: equal pulses, 4/4 symmetric  Neurological: Alert and oriented X3, mood and affect normal  Musculoskeletal: normal strength and tone         Labs:     Recent Labs  Lab 06/08/16  1206   Troponin I 0.03                   Recent Labs  Lab 06/11/16  0031   Magnesium 1.5*       Recent Labs  Lab 06/05/16  0552   PT 12.9   PT INR 1.0   PTT 40*       Recent Labs  Lab 06/11/16  0031 06/10/16  0527 06/09/16  0517   WBC 5.11 5.58 6.23   Hgb 8.8* 9.2* 10.0*   Hematocrit 27.4* 29.0* 31.3*   Platelets 206 215 227       Recent Labs  Lab  06/11/16  0031 06/10/16  0527 06/09/16  0517   Sodium 130* 127* 127*   Potassium 4.0 4.0 4.0   Chloride 105 103 103   CO2 16* 14* 18*   BUN 22.0* 23.0* 23.0*   Creatinine 2.1* 2.4* 2.3*   EGFR 23.7 20.3 21.4   Glucose 103* 89 110*   Calcium 8.0* 8.1* 8.1*           Invalid input(s): FREET4    .  Lab Results   Component Value Date    BNP 504.8 (H) 05/31/2016        Weight Monitoring 06/03/2016 06/05/2016 06/06/2016 06/07/2016 06/08/2016 06/09/2016 06/11/2016   Height 152.4 cm - - - 152.4 cm - -   Height Method - - - - Stated - -   Weight 64.003 kg 66.18 kg 65.137 kg 65.681 kg 65.363 kg 65.318 kg 66.271 kg   Weight Method - Bed Scale Bed Scale Bed Scale Standing Scale Standing Scale Standing Scale   BMI (calculated) 27.6 kg/m2 - - - 28.2 kg/m2 - -       Imaging:       ____________________________________________    Signed by: Jackelyn Hoehn, PA-C    Patient seen and examined, assessment and plan reviewed. Cor: NL S1 S2, no murmurs. Planned for CABG tomorrow. Continue BP control.    Timmothy Sours, MD [facility]  NP [place] (8am-5pm)  MD Spectralink [phone] or [phone] (8am-5pm)  Arrhythmia Spectralink [phone] (8am-4:30pm)  After hours, non urgent consult line [phone]  After Hours, urgent consults [phone]

## 2016-06-11 NOTE — Plan of Care (Addendum)
Problem: Safety  Goal: Patient will be free from injury during hospitalization  Outcome: Progressing    Goal: Patient will be free from infection during hospitalization  Outcome: Progressing      Problem: Discharge Barriers  Goal: Patient will be discharged home or other facility with appropriate resources  Outcome: Progressing      Problem: Psychosocial and Spiritual Needs  Goal: Demonstrates ability to cope with hospitalization/illness  Outcome: Completed Date Met: 06/11/16      Problem: Hemodynamic Status: Cardiac  Goal: Stable vital signs and fluid balance  Outcome: Progressing      Problem: Inadequate Tissue Perfusion  Goal: Adequate tissue perfusion will be maintained  Outcome: Completed Date Met: 06/11/16      Problem: Nutrition  Goal: Nutritional intake is adequate  Outcome: Completed Date Met: 06/11/16    Problem: Moderate/High Fall Risk Score >5  Goal: Patient will remain free of falls  Outcome: Progressing      Problem: Pre-op Phase - Cardiac Surgery  Goal: Patient will be ready for surgery  Outcome: Progressing      Problem: Renal Instability  Goal: Fluid and electrolyte balance are achieved/maintained  Outcome: Progressing    Goal: Perineal skin integrity is maintained or improved  Outcome: Completed Date Met: 06/11/16    Goal: Free from infection  Outcome: Progressing    Goal: Nutritional intake is adequate  Outcome: Completed Date Met: 06/11/16    Pt is A&Ox4, [place] speaking, family at bedside to translate (waiver signed and in chart) SBP elevated in beginning of shift, BP medications adjusted, SBP 140-150s since. PRN orders for BP >160 in MAR if needed. SR w/ 1st degree AVB on tele in the 70-80s, SpO2 97% on RA, afebrile. Tolerating diet, no N/V. Voiding freely in toilet, 1 BM this shift. OOB ad lib/ standby assist. Sodium bicarb QID for hyponatremia. NS infusing at 71mL/hr. Right groin incision CDI, open to air, soft and non tender. No complaints of pain this shift. Plan for CABG tomorrow. All pre  op diagnostics complete, pt will be NPO after midnight and perform 2 CHG showers. Fall mat in place, call light and belongings within reach. Will continue to monitor.

## 2016-06-11 NOTE — Progress Notes (Signed)
[facility]  Progress Note    Assessment:  1. Subacute on CRF; non oliguric (overall rise in serum creatinine from 1/17 -> 9/17); stable  2. ASCAD; transfer from [facility] for CABG  3. Metabolic acidosis; non anion gap; improving  4. Anemia; microcytic/hypochromic  5. Hyponatremia; improving  6. Hypomagnesemia  7. MMPs    Plan:  1.  Continur NaHCO3 @ 1300mg  po qid for today  2.  Continue NS @ 50cc/hr  3.  CXR (AP) 10/6 reviewed  4.  Tapering antihypertensive regimen with tighter hold parameters; avoid overcontrol of BP - current stable hemodynamics  5.  IV Mg repletion       Overall improving metabolic status - OK for CABG/cardiac surgery tomorrow from our perspective; will follow closely post-operatively    Further recommendations to follow    Thank you    Elmyra Ricks, MD  Office - [phone]  Spectra Link - [phone]  ++++++++++++++++++++++++++++++++++++++++++++++++++++++++++++++  Subjective:  No new complaints    Medications:  Scheduled Meds:  Current Facility-Administered Medications   Medication Dose Route Frequency   . amiodarone  400 mg Oral Q12H   . amLODIPine  10 mg Oral Daily   . aspirin EC  325 mg Oral Daily   . carvedilol  25 mg Oral Q12H [place]   . chlorhexidine  15 mL Mouth/Throat Pre-Op   . famotidine  20 mg Oral Daily   . heparin (porcine)  5,000 Units Subcutaneous Q8H [place]   . hydrALAZINE  50 mg Oral Q8H [place]   . iron sucrose  200 mg Intravenous Q24H [facility]   . isosorbide mononitrate  60 mg Oral Daily   . magnesium sulfate  1 g Intravenous Q1H   . mupirocin   Nasal Pre-Op   . polyethylene glycol  17 g Oral Daily   . senna-docusate  1 tablet Oral BID   . SITagliptin  25 mg Oral Daily   . sodium bicarbonate  1,300 mg Oral QID     Continuous Infusions:  . sodium chloride 50 mL/hr at 06/11/16 0332     PRN Meds:acetaminophen, cloNIDine, Nursing communication: Adult Hypoglycemia Treatment Algorithm **AND** dextrose **AND** dextrose **AND** glucagon (rDNA) **AND** Nursing  communication: Document Significant Event Note, hydrALAZINE, insulin lispro, insulin lispro, naloxone, oxyCODONE-acetaminophen, traMADol    Objective:  Vital signs in last 24 hours:  Temp:  [97.3 F (36.3 C)-98.2 F (36.8 C)] 98.2 F (36.8 C)  Heart Rate:  [70-83] 74  Resp Rate:  [16-20] 18  BP: (137-201)/(63-101) 156/71  Intake/Output last 24 hours:    Intake/Output Summary (Last 24 hours) at 06/11/16 1444  Last data filed at 06/10/16 2100   Gross per 24 hour   Intake              800 ml   Output              400 ml   Net              400 ml     Intake/Output this shift:  No intake/output data recorded.    Physical Exam:   Gen: Alert/pleasant; no c/as   CV: S1 S2 N RRR   Chest: No rales/no rhonchi   Ab: ND NT soft no HSM +BS   Ext: No edema    Labs:    Recent Labs  Lab 06/11/16  0031 06/10/16  0527 06/09/16  0517   Glucose 103* 89 110*   BUN 22.0* 23.0* 23.0*  Creatinine 2.1* 2.4* 2.3*   Calcium 8.0* 8.1* 8.1*   Sodium 130* 127* 127*   Potassium 4.0 4.0 4.0   Chloride 105 103 103   CO2 16* 14* 18*   Phosphorus 3.9 4.3  --    Magnesium 1.5* 1.6  --        Recent Labs  Lab 06/11/16  0031 06/10/16  0527 06/09/16  0517   WBC 5.11 5.58 6.23   Hgb 8.8* 9.2* 10.0*   Hematocrit 27.4* 29.0* 31.3*   MCV 67.7* 68.2* 68.9*   MCH 21.7* 21.6* 22.0*   MCHC 32.1 31.7* 31.9*   RDW 18* 18* 18*   MPV 10.9 11.6 10.8   Platelets 206 215 227

## 2016-06-11 NOTE — Progress Notes (Addendum)
Preoperative screening for CABG tomorrow 10/9 with Dr Conchita Paris.  After obtaining renal clearance, patient is stable and ready to proceed to the OR.       COLD SCREEN +        Results     Procedure Component Value Units Date/Time    Glucose Whole Blood - POCT [[id number]]  (Abnormal) Collected:  06/11/16 1724     Updated:  06/11/16 1731     POCT - Glucose Whole blood 161 (H) mg/dL     Glucose Whole Blood - POCT [[id number]]  (Abnormal) Collected:  06/11/16 1148     Updated:  06/11/16 1231     POCT - Glucose Whole blood 136 (H) mg/dL     Urine culture [[id number]] Collected:  06/11/16 1001    Specimen:  Urine from Urine, Clean Catch Updated:  06/11/16 1128    RBC OR HOLD Blood Product [[id number]] Collected:  06/11/16 0031     Updated:  06/11/16 0620     RBC Leukoreduced RBC Leukoreduced     BLUNIT [id number]     Status selected     PRODUCT CODE (NON READABLE) E0336V00     Expiration Date 270350093818     UTYPE O POS     RBC Leukoreduced RBC Leukoreduced     BLUNIT [id number]     Status selected     PRODUCT CODE (NON READABLE) E0336V00     Expiration Date 810175102585     UTYPE O POS    Glucose Whole Blood - POCT [[id number]]  (Abnormal) Collected:  06/11/16 0513     Updated:  06/11/16 0557     POCT - Glucose Whole blood 107 (H) mg/dL     Cold Agglutinin Screen [[id number]] Collected:  06/11/16 0031    Specimen:  Blood Updated:  06/11/16 0250     Cold Screen POS    Narrative:       ?Special Requirements->Leukoreduced  ?Surgery/ Procedure (to be Performed)->CABG  ?Surgery/Procedure Date:->06/12/16  ?Is the patient pregnant?->No  ?Has the patient been transfused w/i the last 3 months?->No    Type and screen [[id number]] Collected:  06/11/16 0031    Specimen:  Blood Updated:  06/11/16 0201     ABO Rh O POS     AB Screen Gel NEG    Narrative:       ?Special Requirements->Leukoreduced  ?Surgery/ Procedure (to be Performed)->CABG  ?Surgery/Procedure Date:->06/12/16  ?Is the patient pregnant?->No  ?Has the patient been transfused  w/i the last 3 months?->No    Magnesium [[id number]]  (Abnormal) Collected:  06/11/16 0031    Specimen:  Blood Updated:  06/11/16 0115     Magnesium 1.5 (L) mg/dL     Phosphorus [[id number]] Collected:  06/11/16 0031    Specimen:  Blood Updated:  06/11/16 0115     Phosphorus 3.9 mg/dL     GFR [[id number]] Collected:  06/11/16 0031     Updated:  06/11/16 0115     EGFR 34.1    Basic Metabolic Panel [[id number]]  (Abnormal) Collected:  06/11/16 0031    Specimen:  Blood Updated:  06/11/16 0115     Glucose 103 (H) mg/dL      BUN 22.0 (H) mg/dL      Creatinine 2.1 (H) mg/dL      Calcium 8.0 (L) mg/dL      Sodium 130 (L) mEq/L      Potassium 4.0 mEq/L      Chloride 105 mEq/L      CO2 16 (L)  mEq/L     Urinalysis with microscopic [[id number]]  (Abnormal) Collected:  06/11/16 0031    Specimen:  Urine Updated:  06/11/16 0115     Urine Type Clean Catch     Color, UA Straw     Clarity, UA Clear     Specific Gravity UA 1.005     Urine pH 7.0     Leukocyte Esterase, UA Trace (A)     Nitrite, UA Negative     Protein, UR 100 (A)     Glucose, UA 50 (A)     Ketones UA Negative     Urobilinogen, UA Normal mg/dL      Bilirubin, UA Negative     Blood, UA Small (A)     RBC, UA 3 - 5 /hpf      WBC, UA 0 - 5 /hpf     CBC without differential [[id number]]  (Abnormal) Collected:  06/11/16 0031    Specimen:  Blood from Blood Updated:  06/11/16 0110     WBC 5.11 x10 3/uL      Hgb 8.8 (L) g/dL      Hematocrit 27.4 (L) %      Platelets 206 x10 3/uL      RBC 4.05 (L) x10 6/uL      MCV 67.7 (L) fL      MCH 21.7 (L) pg      MCHC 32.1 g/dL      RDW 18 (H) %      MPV 10.9 fL      Nucleated RBC 0.0 /100 WBC      Absolute NRBC 0.00 x10 3/uL     Glucose Whole Blood - POCT [[id number]]  (Abnormal) Collected:  06/10/16 2124     Updated:  06/10/16 2145     POCT - Glucose Whole blood 117 (H) mg/dL                 TEE 05/31/16  CONCLUSION:     1. No evidence of an aortic dissection  2. Dynamic left ventricular function  with an  of 70%  3. Adequately functioning  cardiac  valves                      CAROTID  ULTRASOUND 06/06/2016                      RIGHT  CAROTID: Imaging demonstrates mild to                      moderate  calcified plaque near the carotid bulb.                      Doppler  is normal.                         LEFT CAROTID:  Imaging demonstrates mild                      calcified  plaque near the bulb. Doppler is                      normal.                         PROXIMAL  VERTEBRAL ARTERIES: Patent. Antegrade  flow.                         MEASURED  VELOCITIES (CM/S):                         Right  CCA:              87                      Right  proximal ICA (peak systolic): 013                      Right  proximal ICA (end diastolic): 26                      Right  ECA:               106                         Left CCA:                  86                      Left proximal  ICA (peak systolic): 97                      Left proximal  ICA (end diastolic): 24                      Left ECA:             140                         IMPRESSION:                       Mild  to moderate bilateral carotid bulb                      calcified  plaque                      without  hemodynamically significant stenosis.       CXR clear      LE [place] 10/8  IMPRESSION:     1. Widely patent bilateral great and small saphenous veins, no evidence  of superficial thrombosis. Mild diffuse wall thickening of the small  saphenous veins bilaterally.  2. Vein measurements as above.        PFTS   06/09/16 1200   Spirometry Pre and/or Post   Procedure Location and Type Bedside- Pre Only   Pre FVC 1.66 L   Pre FEV1 1.2 L   Pre FEV1/FVC % (Calculated) 72   Patient follows commands Yes   Cough during exhalation No   Early termination No   Leak No   Variable effort No   2 FVC 150 ml of each other Yes   2 FEV1 150 ml of each other Yes   3 maneuvers performed Yes   Patient Effort Good   Adverse Reactions None   Primary Charges   $ Spirometry Type Performed

## 2016-06-11 NOTE — Progress Notes (Signed)
bp elevated 197/84 paged MD multiple times still waiting for response  6am clonidine given around 0421 will recheck BP

## 2016-06-12 ENCOUNTER — Inpatient Hospital Stay: Payer: Medicare Other | Admitting: Anesthesiology

## 2016-06-12 ENCOUNTER — Inpatient Hospital Stay: Payer: Medicare Other

## 2016-06-12 ENCOUNTER — Encounter
Admission: RE | Disposition: A | Discharge status: Home Health | Payer: Self-pay | Source: Ambulatory Visit | Attending: Thoracic Surgery (Cardiothoracic Vascular Surgery)

## 2016-06-12 HISTORY — PX: CORONARY ARTERY BYPASS: SHX3487

## 2016-06-12 HISTORY — PX: ENDOSCOPIC,VEIN HARVEST: SHX3864

## 2016-06-12 LAB — BLOOD GAS, ARTERIAL
Arterial Total CO2: 22.7 mEq/L — ABNORMAL LOW (ref 24.0–30.0)
Arterial Total CO2: 24 mEq/L (ref 24.0–30.0)
Arterial Total CO2: 22.5 mEq/L — ABNORMAL LOW (ref 24.0–30.0)
Arterial Total CO2: 21.8 mEq/L — ABNORMAL LOW (ref 24.0–30.0)
Base Excess, Arterial: -3.7 mEq/L — ABNORMAL LOW (ref <=2.0)
Base Excess, Arterial: -6.1 mEq/L — ABNORMAL LOW (ref <=2.0)
Base Excess, Arterial: -4.8 mEq/L — ABNORMAL LOW (ref <=2.0)
Base Excess, Arterial: -5.6 mEq/L — ABNORMAL LOW (ref <=2.0)
FIO2: 100 %
FIO2: 40 %
FIO2: 40 %
HCO3, Arterial: 21.4 mEq/L — ABNORMAL LOW (ref 23.0–29.0)
HCO3, Arterial: 22.1 mEq/L — ABNORMAL LOW (ref 23.0–29.0)
HCO3, Arterial: 21.1 mEq/L — ABNORMAL LOW (ref 23.0–29.0)
HCO3, Arterial: 20.5 mEq/L — ABNORMAL LOW (ref 23.0–29.0)
O2 Sat, Arterial: >100 % (ref 95.0–100.0)
O2 Sat, Arterial: 98.6 % (ref 95.0–100.0)
O2 Sat, Arterial: 98.3 % (ref 95.0–100.0)
O2 Sat, Arterial: 99.6 % (ref 95.0–100.0)
PEEP: 5
PEEP: 5
PEEP: 5
Pressure Support: 5
Pressure Support: 5
Pressure Support: 5
Rate: 10
Temperature: 35.2
Temperature: 35.7
Temperature: 36.1
Temperature: 36.3
Tidal vol.: 370
pCO2, Arterial: 38.5 mmhg (ref 35.0–45.0)
pCO2, Arterial: 56.7 mmhg — ABNORMAL HIGH (ref 35.0–45.0)
pCO2, Arterial: 43.5 mmhg (ref 35.0–45.0)
pCO2, Arterial: 43.8 mmhg (ref 35.0–45.0)
pH, Arterial: 7.354 (ref 7.350–7.450)
pH, Arterial: 7.207 — ABNORMAL LOW (ref 7.350–7.450)
pH, Arterial: 7.302 — ABNORMAL LOW (ref 7.350–7.450)
pH, Arterial: 7.287 — ABNORMAL LOW (ref 7.350–7.450)
pO2, Arterial: 459 mmhg — ABNORMAL HIGH (ref 80.0–90.0)
pO2, Arterial: 133 mmhg — ABNORMAL HIGH (ref 80.0–90.0)
pO2, Arterial: 156 mmhg — ABNORMAL HIGH (ref 80.0–90.0)
pO2, Arterial: 180 mmhg — ABNORMAL HIGH (ref 80.0–90.0)

## 2016-06-12 LAB — BLOOD GAS, VENOUS
Base Excess, Ven: -3.6 mEq/L
Base Excess, Ven: -1.4 mEq/L
Base Excess, Ven: -5 mEq/L
HCO3, Ven: 21.5 mEq/L
HCO3, Ven: 19.7 mEq/L
HCO3, Ven: 22.5 mEq/L
O2 Sat, Venous: 88.6 %
O2 Sat, Venous: 85.4 %
O2 Sat, Venous: 90.9 %
Temperature: 37
Temperature: 37
Temperature: 37
Venous Total CO2: 22.9 mEq/L
Venous Total CO2: 20.9 mEq/L
Venous Total CO2: 23.6 mEq/L
pCO2, Venous: 43.3 mmhg
pCO2, Venous: 36.4 mmhg
pCO2, Venous: 37.6 mmhg
pH, Ven: 7.318
pH, Ven: 7.34
pH, Ven: 7.407
pO2, Venous: 58.9 mmhg
pO2, Venous: 48.3 mmhg
pO2, Venous: 53.3 mmhg

## 2016-06-12 LAB — COOXIMETRY PROFILE
Carboxyhemoglobin: 1.6 % (ref 0.0–3.0)
Carboxyhemoglobin: 1.6 % (ref 0.0–3.0)
Carboxyhemoglobin: 1.7 % (ref 0.0–3.0)
Hematocrit Total Calculated: 19.1 % — ABNORMAL LOW (ref 37.0–47.0)
Hematocrit Total Calculated: 29.9 % — ABNORMAL LOW (ref 37.0–47.0)
Hematocrit Total Calculated: 30.1 % — ABNORMAL LOW (ref 37.0–47.0)
Hemoglobin Total: 6.1 g/dL — ABNORMAL LOW (ref 12.0–16.0)
Hemoglobin Total: 9.7 g/dL — ABNORMAL LOW (ref 12.0–16.0)
Hemoglobin Total: 9.7 g/dL — ABNORMAL LOW (ref 12.0–16.0)
Methemoglobin: 1.1 % (ref 0.0–3.0)
Methemoglobin: 0.5 % (ref 0.0–3.0)
Methemoglobin: 1.4 % (ref 0.0–3.0)
O2 Content: 7.5
O2 Content: 11.4
O2 Content: 12
Oxygenated Hemoglobin: 86.2 % (ref 85.0–98.0)
Oxygenated Hemoglobin: 83.5 % — ABNORMAL LOW (ref 85.0–98.0)
Oxygenated Hemoglobin: 88.1 % (ref 85.0–98.0)

## 2016-06-12 LAB — ABG WITH NA/K/CA IONIZED
Arterial Total CO2: 21.1 mEq/L — ABNORMAL LOW (ref 24.0–30.0)
Arterial Total CO2: 21.3 mEq/L — ABNORMAL LOW (ref 24.0–30.0)
Arterial Total CO2: 20.2 mEq/L — ABNORMAL LOW (ref 24.0–30.0)
Arterial Total CO2: 22.7 mEq/L — ABNORMAL LOW (ref 24.0–30.0)
Arterial Total CO2: 20.3 mEq/L — ABNORMAL LOW (ref 24.0–30.0)
Arterial Total CO2: 21.7 mEq/L — ABNORMAL LOW (ref 24.0–30.0)
Base Excess, Arterial: -4.3 mEq/L — ABNORMAL LOW (ref <=2.0)
Base Excess, Arterial: -4.2 mEq/L — ABNORMAL LOW (ref <=2.0)
Base Excess, Arterial: -1.4 mEq/L (ref <=2.0)
Base Excess, Arterial: -4.8 mEq/L — ABNORMAL LOW (ref <=2.0)
Base Excess, Arterial: -2.7 mEq/L — ABNORMAL LOW (ref <=2.0)
Base Excess, Arterial: -4.6 mEq/L — ABNORMAL LOW (ref <=2.0)
Calcium, Ionized: 2.5 mEq/L (ref 2.30–2.58)
Calcium, Ionized: 2.36 mEq/L (ref 2.30–2.58)
Calcium, Ionized: 2.27 mEq/L — ABNORMAL LOW (ref 2.30–2.58)
Calcium, Ionized: 2.29 mEq/L — ABNORMAL LOW (ref 2.30–2.58)
Calcium, Ionized: 2.59 mEq/L — ABNORMAL HIGH (ref 2.30–2.58)
Calcium, Ionized: 2.79 mEq/L — ABNORMAL HIGH (ref 2.30–2.58)
HCO3, Arterial: 20 mEq/L — ABNORMAL LOW (ref 23.0–29.0)
HCO3, Arterial: 20.2 mEq/L — ABNORMAL LOW (ref 23.0–29.0)
HCO3, Arterial: 19.2 mEq/L — ABNORMAL LOW (ref 23.0–29.0)
HCO3, Arterial: 21.7 mEq/L — ABNORMAL LOW (ref 23.0–29.0)
HCO3, Arterial: 19.3 mEq/L — ABNORMAL LOW (ref 23.0–29.0)
HCO3, Arterial: 20.7 mEq/L — ABNORMAL LOW (ref 23.0–29.0)
O2 Sat, Arterial: 98.9 % (ref 95.0–100.0)
O2 Sat, Arterial: >100 % (ref 95.0–100.0)
O2 Sat, Arterial: 100 % (ref 95.0–100.0)
O2 Sat, Arterial: >100 % (ref 95.0–100.0)
O2 Sat, Arterial: >100 % (ref 95.0–100.0)
O2 Sat, Arterial: >100 % (ref 95.0–100.0)
Temperature: 37
Temperature: 37
Temperature: 37
Temperature: 37
Temperature: 36
Temperature: 36.5
Whole Blood Potassium: 4.1 mEq/L (ref 3.5–5.3)
Whole Blood Potassium: 5.1 mEq/L (ref 3.5–5.3)
Whole Blood Potassium: 5.7 mEq/L — ABNORMAL HIGH (ref 3.5–5.3)
Whole Blood Potassium: 5.9 mEq/L — ABNORMAL HIGH (ref 3.5–5.3)
Whole Blood Potassium: 5 mEq/L (ref 3.5–5.3)
Whole Blood Potassium: 5.4 mEq/L — ABNORMAL HIGH (ref 3.5–5.3)
Whole Blood Sodium: 136 mEq/L (ref 136–146)
Whole Blood Sodium: 134 mEq/L — ABNORMAL LOW (ref 136–146)
Whole Blood Sodium: 133 mEq/L — ABNORMAL LOW (ref 136–146)
Whole Blood Sodium: 135 mEq/L — ABNORMAL LOW (ref 136–146)
Whole Blood Sodium: 135 mEq/L — ABNORMAL LOW (ref 136–146)
Whole Blood Sodium: 136 mEq/L (ref 136–146)
pCO2, Arterial: 36 mmhg (ref 35.0–45.0)
pCO2, Arterial: 36 mmhg (ref 35.0–45.0)
pCO2, Arterial: 32.4 mmhg — ABNORMAL LOW (ref 35.0–45.0)
pCO2, Arterial: 33.4 mmhg — ABNORMAL LOW (ref 35.0–45.0)
pCO2, Arterial: 31.7 mmhg — ABNORMAL LOW (ref 35.0–45.0)
pCO2, Arterial: 31.7 mmhg — ABNORMAL LOW (ref 35.0–45.0)
pH, Arterial: 7.364 (ref 7.350–7.450)
pH, Arterial: 7.368 (ref 7.350–7.450)
pH, Arterial: 7.378 (ref 7.350–7.450)
pH, Arterial: 7.442 (ref 7.350–7.450)
pH, Arterial: 7.397 (ref 7.350–7.450)
pH, Arterial: 7.428 (ref 7.350–7.450)
pO2, Arterial: 117 mmhg — ABNORMAL HIGH (ref 80.0–90.0)
pO2, Arterial: 259 mmhg — ABNORMAL HIGH (ref 80.0–90.0)
pO2, Arterial: 209 mmhg — ABNORMAL HIGH (ref 80.0–90.0)
pO2, Arterial: 259 mmhg — ABNORMAL HIGH (ref 80.0–90.0)
pO2, Arterial: 441 mmhg — ABNORMAL HIGH (ref 80.0–90.0)
pO2, Arterial: 528 mmhg — ABNORMAL HIGH (ref 80.0–90.0)

## 2016-06-12 LAB — RED BLOOD CELLS OR HOLD
Expiration Date: 201711062359
Expiration Date: 201711062359
Expiration Date: 201711062359
Expiration Date: 201711072359
Status: TRANSFUSED
Status: TRANSFUSED
UTYPE: O POS
UTYPE: O POS
UTYPE: O POS
UTYPE: O POS

## 2016-06-12 LAB — PT/INR
PT INR: 1 (ref 0.9–1.1)
PT INR: 1.2 — ABNORMAL HIGH (ref 0.9–1.1)
PT: 13.6 s (ref 12.6–15.0)
PT: 15.6 s — ABNORMAL HIGH (ref 12.6–15.0)

## 2016-06-12 LAB — TOTAL HEMOGLOBIN GROUP
Hematocrit Total Calculated: 30.3 % — ABNORMAL LOW (ref 37.0–47.0)
Hematocrit Total Calculated: 18.5 % — ABNORMAL LOW (ref 37.0–47.0)
Hematocrit Total Calculated: 29.6 % — ABNORMAL LOW (ref 37.0–47.0)
Hematocrit Total Calculated: 30 % — ABNORMAL LOW (ref 37.0–47.0)
Hematocrit Total Calculated: 28.8 % — ABNORMAL LOW (ref 37.0–47.0)
Hematocrit Total Calculated: 30.2 % — ABNORMAL LOW (ref 37.0–47.0)
Hemoglobin Total: 9.8 g/dL — ABNORMAL LOW (ref 12.0–16.0)
Hemoglobin Total: 5.9 g/dL — CL (ref 12.0–16.0)
Hemoglobin Total: 9.6 g/dL — ABNORMAL LOW (ref 12.0–16.0)
Hemoglobin Total: 9.7 g/dL — ABNORMAL LOW (ref 12.0–16.0)
Hemoglobin Total: 9.3 g/dL — ABNORMAL LOW (ref 12.0–16.0)
Hemoglobin Total: 9.8 g/dL — ABNORMAL LOW (ref 12.0–16.0)

## 2016-06-12 LAB — MAGNESIUM
Magnesium: 2.4 mg/dL (ref 1.6–2.6)
Magnesium: 3.2 mg/dL — ABNORMAL HIGH (ref 1.6–2.6)
Magnesium: 2.9 mg/dL — ABNORMAL HIGH (ref 1.6–2.6)

## 2016-06-12 LAB — GLUCOSE WHOLE BLOOD - POCT
Whole Blood Glucose POCT: 123 mg/dL — ABNORMAL HIGH (ref 70–100)
Whole Blood Glucose POCT: 81 mg/dL (ref 70–100)
Whole Blood Glucose POCT: 112 mg/dL — ABNORMAL HIGH (ref 70–100)
Whole Blood Glucose POCT: 124 mg/dL — ABNORMAL HIGH (ref 70–100)
Whole Blood Glucose POCT: 138 mg/dL — ABNORMAL HIGH (ref 70–100)
Whole Blood Glucose POCT: 117 mg/dL — ABNORMAL HIGH (ref 70–100)
Whole Blood Glucose POCT: 123 mg/dL — ABNORMAL HIGH (ref 70–100)
Whole Blood Glucose POCT: 112 mg/dL — ABNORMAL HIGH (ref 70–100)
Whole Blood Glucose POCT: 102 mg/dL — ABNORMAL HIGH (ref 70–100)
Whole Blood Glucose POCT: 94 mg/dL (ref 70–100)
Whole Blood Glucose POCT: 96 mg/dL (ref 70–100)

## 2016-06-12 LAB — BASIC METABOLIC PANEL
BUN: 18 mg/dL (ref 7.0–19.0)
BUN: 16 mg/dL (ref 7.0–19.0)
CO2: 16 mEq/L — ABNORMAL LOW (ref 22–29)
CO2: 18 mEq/L — ABNORMAL LOW (ref 22–29)
Calcium: 8.3 mg/dL — ABNORMAL LOW (ref 8.5–10.5)
Calcium: 8.1 mg/dL — ABNORMAL LOW (ref 8.5–10.5)
Chloride: 108 mEq/L (ref 100–111)
Chloride: 112 mEq/L — ABNORMAL HIGH (ref 100–111)
Creatinine: 2 mg/dL — ABNORMAL HIGH (ref 0.6–1.0)
Creatinine: 1.9 mg/dL — ABNORMAL HIGH (ref 0.6–1.0)
Glucose: 117 mg/dL — ABNORMAL HIGH (ref 70–100)
Glucose: 90 mg/dL (ref 70–100)
Potassium: 4 mEq/L (ref 3.5–5.1)
Potassium: 5 mEq/L (ref 3.5–5.1)
Sodium: 134 mEq/L — ABNORMAL LOW (ref 136–145)
Sodium: 136 mEq/L (ref 136–145)

## 2016-06-12 LAB — PHOSPHORUS: Phosphorus: 3.5 mg/dL (ref 2.3–4.7)

## 2016-06-12 LAB — TEG HEPARIN NEUTRALIZED
TEG Angle Neutralized: 77.2 (ref 55.2–78.4)
TEG CI Neutralized: 4.1 — ABNORMAL HIGH (ref <=3.00)
TEG K Time Neutralized: 0.9 (ref 0.8–2.8)
TEG R Time Neutralized: 5.1 (ref 2.5–7.5)
Teg MA Neutralized: 78.9 — ABNORMAL HIGH (ref 50.6–69.4)

## 2016-06-12 LAB — CBC
Absolute NRBC: 0 10*3/uL
Absolute NRBC: 0 10*3/uL
Hematocrit: 29.2 % — ABNORMAL LOW (ref 37.0–47.0)
Hematocrit: 28 % — ABNORMAL LOW (ref 37.0–47.0)
Hgb: 9.3 g/dL — ABNORMAL LOW (ref 12.0–16.0)
Hgb: 9 g/dL — ABNORMAL LOW (ref 12.0–16.0)
MCH: 21.7 pg — ABNORMAL LOW (ref 28.0–32.0)
MCH: 23.6 pg — ABNORMAL LOW (ref 28.0–32.0)
MCHC: 31.8 g/dL — ABNORMAL LOW (ref 32.0–36.0)
MCHC: 32.1 g/dL (ref 32.0–36.0)
MCV: 68.2 fL — ABNORMAL LOW (ref 80.0–100.0)
MCV: 73.5 fL — ABNORMAL LOW (ref 80.0–100.0)
MPV: 10.4 fL (ref 9.4–12.3)
MPV: 10.9 fL (ref 9.4–12.3)
Nucleated RBC: 0 /100 WBC (ref 0.0–1.0)
Nucleated RBC: 0 /100 WBC (ref 0.0–1.0)
Platelets: 235 10*3/uL (ref 140–400)
Platelets: 129 10*3/uL — ABNORMAL LOW (ref 140–400)
RBC: 4.28 10*6/uL (ref 4.20–5.40)
RBC: 3.81 10*6/uL — ABNORMAL LOW (ref 4.20–5.40)
RDW: 18 % — ABNORMAL HIGH (ref 12–15)
RDW: 21 % — ABNORMAL HIGH (ref 12–15)
WBC: 7.19 10*3/uL (ref 3.50–10.80)
WBC: 5.99 10*3/uL (ref 3.50–10.80)

## 2016-06-12 LAB — HEPARIN ASSAY
Heparin Assay Test Concentration: 1.3 mg/kg
Heparin Assay Test Concentration: 2.5 mg/kg
Heparin Assay Test Concentration: 2 mg/kg
Heparin Assay Test Concentration: 2.5 mg/kg
Heparin Assay Test Concentration: 2 mg/kg
Heparin Assay Test Concentration: 0 mg/kg
Total Protamine Dose: 150 mg
Total Units of Heparin Required: 20000

## 2016-06-12 LAB — ACTIVATED CLOTTING TIME
ACT POCT: 184 — ABNORMAL HIGH (ref 85–120)
ACT POCT: 845 — ABNORMAL HIGH (ref 85–120)
ACT POCT: 765 — ABNORMAL HIGH (ref 85–120)
ACT POCT: 780 — ABNORMAL HIGH (ref 85–120)
ACT POCT: 665 — ABNORMAL HIGH (ref 85–120)
ACT POCT: 164 — ABNORMAL HIGH (ref 85–120)

## 2016-06-12 LAB — POTASSIUM: Potassium: 4.6 mEq/L (ref 3.5–5.1)

## 2016-06-12 LAB — GLUCOSE WHOLE BLOOD
Whole Blood Glucose: 135 mg/dL — ABNORMAL HIGH (ref 70–100)
Whole Blood Glucose: 123 mg/dL — ABNORMAL HIGH (ref 70–100)
Whole Blood Glucose: 136 mg/dL — ABNORMAL HIGH (ref 70–100)
Whole Blood Glucose: 140 mg/dL — ABNORMAL HIGH (ref 70–100)
Whole Blood Glucose: 117 mg/dL — ABNORMAL HIGH (ref 70–100)
Whole Blood Glucose: 99 mg/dL (ref 70–100)

## 2016-06-12 LAB — GFR
EGFR: 25.1
EGFR: 26.6

## 2016-06-12 SURGERY — CORONARY ARTERY BYPASS
Anesthesia: Anesthesia General | Site: Leg Upper | Wound class: Clean

## 2016-06-12 MED ORDER — PROPOFOL 10 MG/ML IV EMUL (WRAP)
INTRAVENOUS | Status: AC
Start: 2016-06-12 — End: ?
  Filled 2016-06-12: qty 50

## 2016-06-12 MED ORDER — FAMOTIDINE 20 MG/2ML IV SOLN
INTRAVENOUS | Status: AC
Start: 2016-06-12 — End: ?
  Filled 2016-06-12: qty 2

## 2016-06-12 MED ORDER — SODIUM CHLORIDE 0.9 % IV SOLN
INTRAVENOUS | Status: DC | PRN
Start: 2016-06-12 — End: 2016-06-12
  Administered 2016-06-12: 132 mg/h via INTRAVENOUS

## 2016-06-12 MED ORDER — SODIUM BICARBONATE 650 MG PO TABS
1300.0000 mg | ORAL_TABLET | Freq: Three times a day (TID) | ORAL | Status: DC
Start: 2016-06-12 — End: 2016-06-14
  Administered 2016-06-12 – 2016-06-14 (×5): 1300 mg via ORAL
  Filled 2016-06-12 (×5): qty 2

## 2016-06-12 MED ORDER — SODIUM BICARBONATE 650 MG PO TABS
1300.0000 mg | ORAL_TABLET | Freq: Once | ORAL | Status: AC
Start: 2016-06-12 — End: 2016-06-12
  Administered 2016-06-12: 1300 mg via OROGASTRIC
  Filled 2016-06-12: qty 2

## 2016-06-12 MED ORDER — ALBUMIN HUMAN 5 % IV SOLN
INTRAVENOUS | Status: AC
Start: 2016-06-12 — End: ?
  Filled 2016-06-12: qty 250

## 2016-06-12 MED ORDER — SODIUM CHLORIDE 0.9 % IV SOLN
1.0000 [IU]/h | INTRAVENOUS | Status: DC
Start: 2016-06-12 — End: 2016-06-14
  Administered 2016-06-12: 1 [IU]/h via INTRAVENOUS
  Administered 2016-06-14 (×2): 0.5 [IU]/h via INTRAVENOUS
  Filled 2016-06-12 (×2): qty 1

## 2016-06-12 MED ORDER — SODIUM CHLORIDE 0.9 % IV SOLN
INTRAVENOUS | Status: DC | PRN
Start: 2016-06-12 — End: 2016-06-12

## 2016-06-12 MED ORDER — ONDANSETRON HCL 4 MG/2ML IJ SOLN
4.0000 mg | Freq: Three times a day (TID) | INTRAMUSCULAR | Status: DC | PRN
Start: 2016-06-12 — End: 2016-06-15
  Administered 2016-06-13: 4 mg via INTRAVENOUS
  Filled 2016-06-12: qty 2

## 2016-06-12 MED ORDER — INSULIN REGULAR HUMAN 100 UNIT/ML IJ SOLN
INTRAMUSCULAR | Status: DC | PRN
Start: 2016-06-12 — End: 2016-06-12
  Administered 2016-06-12: 2 [IU]/h via INTRAVENOUS

## 2016-06-12 MED ORDER — SUFENTANIL CITRATE 250 MCG/5ML IV SOLN
INTRAVENOUS | Status: AC
Start: 2016-06-12 — End: ?
  Filled 2016-06-12: qty 5

## 2016-06-12 MED ORDER — PROTAMINE SULFATE 10 MG/ML IV SOLN
INTRAVENOUS | Status: DC | PRN
Start: 2016-06-12 — End: 2016-06-12
  Administered 2016-06-12: 150 mg via INTRAVENOUS

## 2016-06-12 MED ORDER — INSULIN REGULAR HUMAN 100 UNIT/ML IJ SOLN
INTRAMUSCULAR | Status: DC | PRN
Start: 2016-06-12 — End: 2016-06-12

## 2016-06-12 MED ORDER — LACTATED RINGERS IV SOLN
INTRAVENOUS | Status: DC | PRN
Start: 2016-06-12 — End: 2016-06-12

## 2016-06-12 MED ORDER — NICARDIPINE HCL 2.5 MG/ML IV SOLN
0.0000 mg/h | INTRAVENOUS | Status: DC | PRN
Start: 2016-06-12 — End: 2016-06-13
  Administered 2016-06-12: 5 mg/h via INTRAVENOUS
  Administered 2016-06-12: 10 mg/h via INTRAVENOUS
  Administered 2016-06-13: 5 mg/h via INTRAVENOUS
  Administered 2016-06-13 (×2): 10 mg/h via INTRAVENOUS
  Filled 2016-06-12 (×9): qty 10

## 2016-06-12 MED ORDER — NEOSTIGMINE METHYLSULFATE 1 MG/ML IJ/IV SOLN (WRAP)
0.0500 mg/kg | Freq: Once | Status: AC
Start: 2016-06-12 — End: 2016-06-12
  Administered 2016-06-12: 3.31 mg via INTRAVENOUS
  Filled 2016-06-12: qty 10

## 2016-06-12 MED ORDER — PROTAMINE SULFATE 10 MG/ML IV SOLN
INTRAVENOUS | Status: AC
Start: 2016-06-12 — End: ?
  Filled 2016-06-12: qty 10

## 2016-06-12 MED ORDER — PROPOFOL 10 MG/ML IV EMUL (WRAP)
INTRAVENOUS | Status: DC | PRN
Start: 2016-06-12 — End: 2016-06-12
  Administered 2016-06-12: 50 mg via INTRAVENOUS
  Administered 2016-06-12: 30 mg via INTRAVENOUS
  Administered 2016-06-12: 50 mg via INTRAVENOUS

## 2016-06-12 MED ORDER — FAMOTIDINE 20 MG PO TABS
20.0000 mg | ORAL_TABLET | Freq: Every day | ORAL | Status: AC
Start: 2016-06-12 — End: 2016-06-13
  Administered 2016-06-12 – 2016-06-13 (×2): 20 mg via ORAL
  Filled 2016-06-12 (×3): qty 1

## 2016-06-12 MED ORDER — ROCURONIUM BROMIDE 50 MG/5ML IV SOLN
INTRAVENOUS | Status: AC
Start: 2016-06-12 — End: ?
  Filled 2016-06-12: qty 10

## 2016-06-12 MED ORDER — PHENYLEPHRINE 100 MCG/ML IV BOLUS (ANESTHESIA)
PREFILLED_SYRINGE | INTRAVENOUS | Status: DC | PRN
Start: 2016-06-12 — End: 2016-06-12
  Administered 2016-06-12 (×2): 50 ug via INTRAVENOUS
  Administered 2016-06-12 (×2): 100 ug via INTRAVENOUS
  Administered 2016-06-12: 200 ug via INTRAVENOUS

## 2016-06-12 MED ORDER — PROPOFOL INFUSION 10 MG/ML
INTRAVENOUS | Status: DC | PRN
Start: 2016-06-12 — End: 2016-06-12
  Administered 2016-06-12: 50 ug/kg/min via INTRAVENOUS

## 2016-06-12 MED ORDER — ALBUMIN HUMAN 5 % IV SOLN
12.5000 g | Freq: Once | INTRAVENOUS | Status: AC
Start: 2016-06-12 — End: 2016-06-12

## 2016-06-12 MED ORDER — SODIUM PHOSPHATES 3 MMOLE/ML IV SOLN (WRAP)
25.0000 mmol | INTRAVENOUS | Status: DC | PRN
Start: 2016-06-12 — End: 2016-06-12

## 2016-06-12 MED ORDER — TRANEXAMIC ACID 2000MG/100 ML ADULT BOLUS
Status: DC | PRN
Start: 2016-06-12 — End: 2016-06-12
  Administered 2016-06-12: 1324 mg via INTRAVENOUS

## 2016-06-12 MED ORDER — CHLORHEXIDINE GLUCONATE 0.12 % MT SOLN
15.0000 mL | Freq: Once | OROMUCOSAL | Status: AC
Start: 2016-06-12 — End: 2016-06-12

## 2016-06-12 MED ORDER — PROTAMINE SULFATE 10 MG/ML IV SOLN
INTRAVENOUS | Status: AC
Start: 2016-06-12 — End: ?
  Filled 2016-06-12: qty 5

## 2016-06-12 MED ORDER — PROPRANOLOL HCL 10 MG PO TABS
10.0000 mg | ORAL_TABLET | Freq: Four times a day (QID) | ORAL | Status: DC
Start: 2016-06-12 — End: 2016-06-12

## 2016-06-12 MED ORDER — TRAMADOL HCL 50 MG PO TABS
50.0000 mg | ORAL_TABLET | ORAL | Status: DC | PRN
Start: 2016-06-12 — End: 2016-06-15
  Administered 2016-06-13 – 2016-06-15 (×4): 50 mg via ORAL
  Filled 2016-06-12 (×4): qty 1

## 2016-06-12 MED ORDER — NITROGLYCERIN IN D5W 200-5 MCG/ML-% IV SOLN
10.0000 ug/min | INTRAVENOUS | Status: DC | PRN
Start: 2016-06-12 — End: 2016-06-13

## 2016-06-12 MED ORDER — EPHEDRINE SULFATE 50 MG/ML IJ/IV SOLN (WRAP)
Status: AC
Start: 2016-06-12 — End: ?
  Filled 2016-06-12: qty 1

## 2016-06-12 MED ORDER — CHLORHEXIDINE GLUCONATE 0.12 % MT SOLN
15.0000 mL | Freq: Two times a day (BID) | OROMUCOSAL | Status: DC
Start: 2016-06-12 — End: 2016-06-15
  Administered 2016-06-12 – 2016-06-14 (×6): 15 mL via OROMUCOSAL
  Filled 2016-06-12 (×7): qty 15

## 2016-06-12 MED ORDER — ASPIRIN 325 MG PO TBEC
325.0000 mg | DELAYED_RELEASE_TABLET | Freq: Every day | ORAL | Status: DC
Start: 2016-06-13 — End: 2016-06-19
  Administered 2016-06-13 – 2016-06-19 (×7): 325 mg via ORAL
  Filled 2016-06-12 (×7): qty 1

## 2016-06-12 MED ORDER — SODIUM PHOSPHATES 3 MMOLE/ML IV SOLN (WRAP)
35.0000 mmol | INTRAVENOUS | Status: DC | PRN
Start: 2016-06-12 — End: 2016-06-12

## 2016-06-12 MED ORDER — SODIUM BICARBONATE 8.4 % IV SOLN
50.0000 meq | Freq: Once | INTRAVENOUS | Status: DC
Start: 2016-06-12 — End: 2016-06-14
  Filled 2016-06-12: qty 50

## 2016-06-12 MED ORDER — LUBRIFRESH P.M. OP OINT
TOPICAL_OINTMENT | OPHTHALMIC | Status: AC
Start: 2016-06-12 — End: ?
  Filled 2016-06-12: qty 3.5

## 2016-06-12 MED ORDER — SODIUM CHLORIDE 0.9 % IJ SOLN
INTRAMUSCULAR | Status: AC
Start: 2016-06-12 — End: ?
  Filled 2016-06-12: qty 10

## 2016-06-12 MED ORDER — OXYCODONE-ACETAMINOPHEN 5-325 MG PO TABS
1.0000 | ORAL_TABLET | ORAL | Status: DC | PRN
Start: 2016-06-12 — End: 2016-06-13

## 2016-06-12 MED ORDER — SODIUM CHLORIDE 0.9 % IV MBP
1.5000 g | Freq: Three times a day (TID) | INTRAVENOUS | Status: AC
Start: 2016-06-12 — End: 2016-06-13
  Administered 2016-06-12 – 2016-06-13 (×3): 1.5 g via INTRAVENOUS
  Filled 2016-06-12 (×4): qty 1500

## 2016-06-12 MED ORDER — VANCOMYCIN HCL 1000 MG IV SOLR
INTRAVENOUS | Status: AC
Start: 2016-06-12 — End: ?
  Filled 2016-06-12: qty 1000

## 2016-06-12 MED ORDER — ETOMIDATE 2 MG/ML IV SOLN
INTRAVENOUS | Status: AC
Start: 2016-06-12 — End: ?
  Filled 2016-06-12: qty 20

## 2016-06-12 MED ORDER — HYDRALAZINE HCL 20 MG/ML IJ SOLN
10.0000 mg | INTRAMUSCULAR | Status: DC | PRN
Start: 2016-06-12 — End: 2016-06-13

## 2016-06-12 MED ORDER — ROCURONIUM BROMIDE 50 MG/5ML IV SOLN
INTRAVENOUS | Status: AC
Start: 2016-06-12 — End: ?
  Filled 2016-06-12: qty 5

## 2016-06-12 MED ORDER — SODIUM CHLORIDE 0.9 % IV SOLN
INTRAVENOUS | Status: DC
Start: 2016-06-12 — End: 2016-06-14
  Administered 2016-06-12: 85 mL/h via INTRAVENOUS

## 2016-06-12 MED ORDER — NICARDIPINE IV BOLUS SYRINGE (ANESTHESIA)
INTRAVENOUS | Status: AC
Start: 2016-06-12 — End: ?
  Filled 2016-06-12: qty 10

## 2016-06-12 MED ORDER — SODIUM CHLORIDE 0.9% BAG (IRRIGATION USE)
INTRAVENOUS | Status: DC | PRN
Start: 2016-06-12 — End: 2016-06-12
  Administered 2016-06-12: 4000 mL

## 2016-06-12 MED ORDER — SODIUM BICARBONATE 8.4 % IV SOLN
INTRAVENOUS | Status: AC
Start: 2016-06-12 — End: ?
  Filled 2016-06-12: qty 50

## 2016-06-12 MED ORDER — NOREPINEPHRINE 8 MG/100 ML (SIMPLE)
1.0000 ug/min | Status: DC | PRN
Start: 2016-06-12 — End: 2016-06-13

## 2016-06-12 MED ORDER — VEIN SOLUTION (OR ONLY) (FX)
Status: DC | PRN
Start: 2016-06-12 — End: 2016-06-12
  Administered 2016-06-12: 1

## 2016-06-12 MED ORDER — LACTATED RINGERS IV BOLUS
250.0000 mL | INTRAVENOUS | Status: DC | PRN
Start: 2016-06-12 — End: 2016-06-15

## 2016-06-12 MED ORDER — HEPARIN SODIUM (PORCINE) 1000 UNIT/ML IJ SOLN
INTRAMUSCULAR | Status: DC | PRN
Start: 2016-06-12 — End: 2016-06-12
  Administered 2016-06-12: 20000 [IU] via INTRAVENOUS

## 2016-06-12 MED ORDER — ONDANSETRON 4 MG PO TBDP
4.0000 mg | ORAL_TABLET | Freq: Three times a day (TID) | ORAL | Status: DC | PRN
Start: 2016-06-12 — End: 2016-06-15

## 2016-06-12 MED ORDER — DEXTROSE 10 % IV BOLUS
250.0000 mL | INTRAVENOUS | Status: DC | PRN
Start: 2016-06-12 — End: 2016-06-14

## 2016-06-12 MED ORDER — ALBUMIN HUMAN 5 % IV SOLN
INTRAVENOUS | Status: AC
Start: 2016-06-12 — End: 2016-06-12
  Administered 2016-06-12: 12.5 g via INTRAVENOUS
  Filled 2016-06-12: qty 500

## 2016-06-12 MED ORDER — NICARDIPINE IV BOLUS SYRINGE (ANESTHESIA)
INTRAVENOUS | Status: DC | PRN
Start: 2016-06-12 — End: 2016-06-12
  Administered 2016-06-12: 0.1 mg via INTRAVENOUS

## 2016-06-12 MED ORDER — LIDOCAINE HCL (PF) 1 % IJ SOLN
INTRAMUSCULAR | Status: DC | PRN
Start: 2016-06-12 — End: 2016-06-12
  Administered 2016-06-12: 70 mg via INTRAVENOUS

## 2016-06-12 MED ORDER — BISACODYL 10 MG RE SUPP
10.0000 mg | Freq: Every day | RECTAL | Status: DC | PRN
Start: 2016-06-12 — End: 2016-06-15

## 2016-06-12 MED ORDER — LIDOCAINE HCL 1 % IJ SOLN
INTRAMUSCULAR | Status: AC
Start: 2016-06-12 — End: ?
  Filled 2016-06-12: qty 10

## 2016-06-12 MED ORDER — ACETAMINOPHEN 325 MG PO TABS
650.0000 mg | ORAL_TABLET | ORAL | Status: DC | PRN
Start: 2016-06-12 — End: 2016-06-13

## 2016-06-12 MED ORDER — ROCURONIUM BROMIDE 10 MG/ML IV SOLN (WRAP)
INTRAVENOUS | Status: DC | PRN
Start: 2016-06-12 — End: 2016-06-12
  Administered 2016-06-12: 100 mg via INTRAVENOUS
  Administered 2016-06-12: 50 mg via INTRAVENOUS

## 2016-06-12 MED ORDER — CHLORHEXIDINE GLUCONATE 0.12 % MT SOLN
OROMUCOSAL | Status: AC
Start: 2016-06-12 — End: 2016-06-12
  Administered 2016-06-12: 15 mL via OROMUCOSAL
  Filled 2016-06-12: qty 15

## 2016-06-12 MED ORDER — MIDAZOLAM HCL 2 MG/2ML IJ SOLN
INTRAMUSCULAR | Status: DC | PRN
Start: 2016-06-12 — End: 2016-06-12
  Administered 2016-06-12: 2 mg via INTRAVENOUS
  Administered 2016-06-12 (×2): 1 mg via INTRAVENOUS

## 2016-06-12 MED ORDER — CARVEDILOL 3.125 MG PO TABS
3.1250 mg | ORAL_TABLET | Freq: Two times a day (BID) | ORAL | Status: DC
Start: 2016-06-12 — End: 2016-06-13
  Administered 2016-06-12 – 2016-06-13 (×2): 3.125 mg via ORAL
  Filled 2016-06-12 (×2): qty 1

## 2016-06-12 MED ORDER — SUFENTANIL CITRATE 250 MCG/5ML IV SOLN
INTRAVENOUS | Status: DC | PRN
Start: 2016-06-12 — End: 2016-06-12
  Administered 2016-06-12: 50 ug via INTRAVENOUS
  Administered 2016-06-12 (×2): 25 ug via INTRAVENOUS
  Administered 2016-06-12 (×2): 50 ug via INTRAVENOUS

## 2016-06-12 MED ORDER — PROPOFOL 10 MG/ML IV EMUL (WRAP)
INTRAVENOUS | Status: AC
Start: 2016-06-12 — End: ?
  Filled 2016-06-12: qty 20

## 2016-06-12 MED ORDER — GLYCOPYRROLATE 0.2 MG/ML IJ SOLN
0.0100 mg/kg | Freq: Once | INTRAMUSCULAR | Status: AC
Start: 2016-06-12 — End: 2016-06-12
  Administered 2016-06-12: 0.662 mg via INTRAVENOUS
  Filled 2016-06-12: qty 4

## 2016-06-12 MED ORDER — SODIUM CHLORIDE 0.9 % IV SOLN
1320.0000 mg | Freq: Once | INTRAVENOUS | Status: DC
Start: 2016-06-12 — End: 2016-06-12
  Filled 2016-06-12: qty 13.2

## 2016-06-12 MED ORDER — CHLORHEXIDINE GLUCONATE 0.12 % MT SOLN
OROMUCOSAL | Status: AC
Start: 2016-06-12 — End: 2016-06-12
  Filled 2016-06-12: qty 15

## 2016-06-12 MED ORDER — FUROSEMIDE 10 MG/ML IJ SOLN
40.0000 mg | Freq: Once | INTRAMUSCULAR | Status: AC
Start: 2016-06-12 — End: 2016-06-12
  Administered 2016-06-12: 40 mg via INTRAVENOUS
  Filled 2016-06-12: qty 4

## 2016-06-12 MED ORDER — VANCOMYCIN 1000 MG IN 250 ML NS IVPB VIAL-MATE (CNR)
INTRAVENOUS | Status: DC | PRN
Start: 2016-06-12 — End: 2016-06-12
  Administered 2016-06-12: 1000 mg via INTRAVENOUS

## 2016-06-12 MED ORDER — SENNOSIDES-DOCUSATE SODIUM 8.6-50 MG PO TABS
1.0000 | ORAL_TABLET | Freq: Every evening | ORAL | Status: DC
Start: 2016-06-12 — End: 2016-06-14
  Administered 2016-06-12 – 2016-06-13 (×2): 1 via ORAL
  Filled 2016-06-12 (×2): qty 1

## 2016-06-12 MED ORDER — MUPIROCIN CALCIUM 2 % NA OINT
TOPICAL_OINTMENT | Freq: Two times a day (BID) | NASAL | Status: DC
Start: 2016-06-12 — End: 2016-06-15
  Administered 2016-06-12 – 2016-06-14 (×6): 0.9 via NASAL
  Filled 2016-06-12 (×7): qty 0.9

## 2016-06-12 MED ORDER — PHENYLEPHRINE 100 MCG/ML IN NACL 0.9% IV SOSY
PREFILLED_SYRINGE | INTRAVENOUS | Status: AC
Start: 2016-06-12 — End: ?
  Filled 2016-06-12: qty 5

## 2016-06-12 MED ORDER — AMIODARONE HCL 200 MG PO TABS
200.0000 mg | ORAL_TABLET | Freq: Two times a day (BID) | ORAL | Status: DC
Start: 2016-06-12 — End: 2016-06-15
  Administered 2016-06-12 – 2016-06-14 (×6): 200 mg via ORAL
  Filled 2016-06-12 (×6): qty 1

## 2016-06-12 MED ORDER — ALBUMIN HUMAN 5 % IV SOLN
12.5000 g | Freq: Once | INTRAVENOUS | Status: AC
Start: 2016-06-12 — End: 2016-06-12
  Administered 2016-06-12: 12.5 g via INTRAVENOUS

## 2016-06-12 MED ORDER — CALCIUM CHLORIDE 10 % IV SOLN
INTRAVENOUS | Status: AC
Start: 2016-06-12 — End: ?
  Filled 2016-06-12: qty 10

## 2016-06-12 MED ORDER — MIDAZOLAM HCL 2 MG/2ML IJ SOLN
INTRAMUSCULAR | Status: AC
Start: 2016-06-12 — End: ?
  Filled 2016-06-12: qty 2

## 2016-06-12 MED ORDER — SODIUM PHOSPHATES 3 MMOLE/ML IV SOLN (WRAP)
15.0000 mmol | INTRAVENOUS | Status: DC | PRN
Start: 2016-06-12 — End: 2016-06-12

## 2016-06-12 MED ORDER — PROPOFOL INFUSION 10 MG/ML
10.0000 ug/kg/min | INTRAVENOUS | Status: DC
Start: 2016-06-12 — End: 2016-06-13
  Administered 2016-06-12: 50 ug/kg/min via INTRAVENOUS

## 2016-06-12 MED ORDER — TRANEXAMIC ACID 1000 MG/10ML IV SOLN
1000.0000 mg | Freq: Once | INTRAVENOUS | Status: DC
Start: 2016-06-12 — End: 2016-06-12
  Filled 2016-06-12: qty 10

## 2016-06-12 MED ORDER — HYDROMORPHONE HCL 1 MG/ML IJ SOLN
0.2500 mg | INTRAMUSCULAR | Status: DC | PRN
Start: 2016-06-12 — End: 2016-06-13
  Administered 2016-06-12: 0.25 mg via INTRAVENOUS
  Filled 2016-06-12: qty 1

## 2016-06-12 MED ORDER — MAGNESIUM SULFATE IN D5W 1-5 GM/100ML-% IV SOLN
1.0000 g | INTRAVENOUS | Status: DC | PRN
Start: 2016-06-12 — End: 2016-06-12

## 2016-06-12 MED ORDER — NOREPINEPHRINE BITARTRATE 1 MG/ML IV SOLN
INTRAVENOUS | Status: DC | PRN
Start: 2016-06-12 — End: 2016-06-12
  Administered 2016-06-12: 3 ug/min via INTRAVENOUS

## 2016-06-12 MED ORDER — ALBUMIN HUMAN 5 % IV SOLN
INTRAVENOUS | Status: DC | PRN
Start: 2016-06-12 — End: 2016-06-12

## 2016-06-12 MED ORDER — FAMOTIDINE 10 MG/ML IV SOLN (WRAP)
INTRAVENOUS | Status: DC | PRN
Start: 2016-06-12 — End: 2016-06-12
  Administered 2016-06-12: 20 mg via INTRAVENOUS

## 2016-06-12 MED FILL — Heparin Sodium (Porcine) Inj 1000 Unit/ML: INTRAMUSCULAR | Qty: 40 | Status: AC

## 2016-06-12 MED FILL — Magnesium Sulfate Inj 50%: INTRAMUSCULAR | Qty: 8 | Status: AC

## 2016-06-12 MED FILL — Phenylephrine HCl IV Soln 10 MG/ML: INTRAVENOUS | Qty: 10 | Status: AC

## 2016-06-12 MED FILL — Sodium Bicarbonate IV Soln 8.4%: INTRAVENOUS | Qty: 50 | Status: AC

## 2016-06-12 MED FILL — Water For Inject, Bacteriostatic Benzyl Alcohol: INTRAMUSCULAR | Qty: 30 | Status: AC

## 2016-06-12 MED FILL — Lidocaine HCl Local Inj 1%: INTRAMUSCULAR | Qty: 20 | Status: AC

## 2016-06-12 MED FILL — Bacteriostatic Sodium Chloride Inj Soln 0.9%: INTRAMUSCULAR | Qty: 60 | Status: AC

## 2016-06-12 MED FILL — Calcium Chloride Inj 10%: INTRAVENOUS | Qty: 10 | Status: AC

## 2016-06-12 MED FILL — Mannitol IV Soln 25%: INTRAVENOUS | Qty: 100 | Status: AC

## 2016-06-12 MED FILL — Methylprednisolone Sod Succ For Inj 500 MG (Base Equiv): INTRAMUSCULAR | Qty: 16 | Status: AC

## 2016-06-12 MED FILL — Potassium Chloride Inj 2 mEq/ML: INTRAVENOUS | Qty: 40 | Status: AC

## 2016-06-12 SURGICAL SUPPLY — 107 items
ADHESIVE SKIN CLOSURE DERMABOND MINI .36 (Suture) ×2
ADHESIVE SKIN CLOSURE DERMABOND MINI .36 ML LIQUID APPLICATOR (Suture) ×2 IMPLANT
ADHESIVE SKNCLS 2 OCTYL CYNCRLT .36ML MN (Suture) ×1
APPLCATOR CHLORAPREP 26ML (Prep) ×27 IMPLANT
APPLIER CLIP 9.0 W/SMALL CLIPS (Clips) ×3 IMPLANT
BLADE BEAVER 6900 MINI BLUE (Blade) ×3 IMPLANT
BLADE MICRO SHARP 15 DEG 3MM (Blade) ×3 IMPLANT
BLADE S/SU RIBBACK CARB STL 11 (Blade) ×3 IMPLANT
BLADE SURG 10 (Blade) ×3 IMPLANT
BLADE SURGICAL DISPOSABLE (Blade) ×3 IMPLANT
CABLE DISPOSABLE PACING (Cable) ×1
CABLE PACING SAFE-CONNECT L8 FT ATRIAL (Cable) ×2
CABLE PACING SAFE-CONNECT L8 FT ATRIAL VENTRICULAR SMALL ALLIGATOR (Cable) ×2 IMPLANT
CABLE PATIENT MYO/LEAD L1.8 MR WHITE (Cable) ×4 IMPLANT
CABLE PT MYOLD 1.8MR DISP WHT (Cable) ×2
CATHETER PA HEP STD MULTIFLEX 8FR 110CM (Procedure Accessories) ×3
CATHETER PULMONARY ARTERY L110 CM 5 LUMEN OD8 FR ICU MEDICAL HEPARIN (Procedure Accessories) ×2 IMPLANT
CLAMP SURGICAL INSERT SOFT/TRA (Clips) ×3 IMPLANT
CLIP INTERNAL SMALL CHEVRON 24 CARTRIDGE (Clips) ×2
CLIP INTERNAL SMALL CHEVRON 24 CARTRIDGE LIGATE TRIANGULATE CROSS (Clips) ×2 IMPLANT
CLIP INTNL TI SM CHEVRON WECK HRZN 24 (Clips) ×1
CLIP SPRING AVD DBL TRAC 6MM (Disposable Instruments) ×3 IMPLANT
CLIP SURGICAL SPRING SOFT LIGH (Clips) ×3 IMPLANT
CONNECTOR PERFUSION REDUCE 3/8IN Y STERILE (Connector) ×4 IMPLANT
CONNECTOR PRFSN Y 3/8IN STRL RDC (Connector) ×4
CONNECTOR REDUCE 1/4X1/4X3/8IN (Connector) ×2
DRAIN CHEST THORACIC TUBE DRY SUCTION (Drain) ×2
DRAIN CHEST THORACIC TUBE DRY SUCTION COLLECTION CHAMBER ATRIUM OASIS (Drain) ×2 IMPLANT
DRAIN INCS PLS ATR OAS LF STRL TUBE DRY (Drain) ×1
DRAIN INCS SIL RND 24FR 5/16IN LF STRL (Drain) ×2
DRAIN OD24 FR RADIOPAQUE 4 FREE FLOW (Drain) ×4
DRAIN OD24 FR RADIOPAQUE 4 FREE FLOW CHANNEL FULL FLUTE BLAKE L5/16 IN (Drain) ×4 IMPLANT
DRAPE SLUSH MACHINE 66 X 44 (Drape) ×3 IMPLANT
DRESSING FM SIL OPTFM 7X7IN LF STRL ADH (Dressing) ×3 IMPLANT
DRESSING HEMOSTAT 2X14IN ABSOR (Dressing) ×3 IMPLANT
ELECTRODE ECG BACKPAD ADLT (Procedure Accessories) ×3 IMPLANT
ELECTRODE ELECTROSURGICAL BLADE L2.5 IN (Blade) ×2
ELECTRODE ELECTROSURGICAL BLADE L2.5 IN OD3/32 IN EDGE L1.1 IN (Blade) ×2 IMPLANT
ELECTRODE ESURG BLDE EDG 3/32IN 2.5IN LF (Blade) ×1
GAUZE KERLIX 4.5X4YDS (Dressing) ×3 IMPLANT
GAUZE SPONGE CURITY 12PLY 4X8 (Dressing) ×3 IMPLANT
GAUZE SPONGE VERSLN 4PLY 4X4IN (Dressing) ×6 IMPLANT
GLOVE SRG PLISPRN 8 BGL PI INDCTR (Glove) ×1
GLOVE SURG BIOGEL SZ7.5 (Glove) ×6 IMPLANT
GLOVE SURGICAL 8 BIOGEL PI INDICATOR (Glove) ×2
GLOVE SURGICAL 8 BIOGEL PI INDICATOR UNDERGLOVE POWDER FREE SMOOTH (Glove) ×2 IMPLANT
INACTIVE USE LAWSON 110816 (Procedure Accessories) ×3 IMPLANT
INHIBITOR ANTI FOG (Procedure Accessories) ×3 IMPLANT
INSERT RETRACTOR (Retractor) ×3 IMPLANT
KIT NURSE CABG IFH (Kits) ×3 IMPLANT
KIT TRNSDCR TRP W/3TRUWVE NLTX (Kits) ×3 IMPLANT
PAD ELECTROSRG GRND REM W CRD (Procedure Accessories) ×6 IMPLANT
PADDING CAST L4 YD X W4 IN COHESION HAND TEARABLE SELF BOND SPECIALIST (Procedure Accessories) ×2 IMPLANT
PADDING CST CTTN SPCLST 100 4YDX4IN LF (Procedure Accessories) ×3
PEN DISP W BLADE ELECTRODE (Cautery) ×3 IMPLANT
PUNCH DISP 4.8MM (Procedure Accessories) ×3 IMPLANT
SCALPEL BARD-PARKER STAINLESS STEEL (Procedure Accessories) ×2
SCALPEL BARD-PARKER STAINLESS STEEL BLADE SURGICAL 15 (Procedure Accessories) ×2 IMPLANT
SCALPEL SRG SS 15 BP LF STRL BLDE DISP (Procedure Accessories) ×1
SET BASIN IHVI (Procedure Accessories) ×3
SET BASIN IHVI SUT12BSHFB (Procedure Accessories) ×2 IMPLANT
SLEEVE CMPR MED KN LGTH KDL SCD 21- IN (Sleeve) ×3
SLEEVE COMPRESSION MEDIUM KNEE LENGTH KENDALL SEQUENTIAL OD21- IN (Sleeve) ×2 IMPLANT
SOLUTION IRR 0.9% NACL 1000ML LF STRL (Irrigation Solutions) ×4
SOLUTION IRRIGATION 0.9% SODIUM CHLORIDE (Irrigation Solutions) ×8
SOLUTION IRRIGATION 0.9% SODIUM CHLORIDE 1000 ML PLASTIC POUR BOTTLE (Irrigation Solutions) ×8 IMPLANT
SUTURE MONOCRYL 4-0 PS2 27IN (Suture) ×9 IMPLANT
SUTURE NABSB 4-0 RB1 PRLN 36IN 2 ARM MFL (Suture) ×3
SUTURE NABSB 4-0 SH-1 PRLN 30IN 2 ARM (Suture) ×3
SUTURE NABSB SS 5 CCS 18IN MFL 4 STRN (Suture) ×1
SUTURE POLYPROPYLENE PROLENE BLUE 4-0 (Suture) ×6
SUTURE POLYPROPYLENE PROLENE BLUE 4-0 RB-1 1/2 CIRLE L36 IN 2 ARM (Suture) ×6 IMPLANT
SUTURE PROLENE 5-0 RB2 30IN (Suture) ×3 IMPLANT
SUTURE PROLENE 5.0 (Suture) ×3 IMPLANT
SUTURE PROLENE 6-0 RB2 30IN (Suture) ×6 IMPLANT
SUTURE PROLENE 7-0 BV1 24IN (Suture) ×3 IMPLANT
SUTURE PROLENE 8-0 BV175-6 (Suture) ×3 IMPLANT
SUTURE PROLENE BLUE 4-0 SH-1 L30 IN 2 (Suture) ×6
SUTURE PROLENE BLUE 4-0 SH-1 L30 IN 2 ARM MONOFILAMENT NONABSORBABLE (Suture) ×6 IMPLANT
SUTURE SILK 0.24IN (Suture) ×3 IMPLANT
SUTURE SILK 1 24IN (Suture) ×3 IMPLANT
SUTURE SILK 2-0 CT1 8X18IN (Suture) ×3 IMPLANT
SUTURE SILK 2.0 FSL 30IN (Suture) ×12 IMPLANT
SUTURE SS STEEL SILVER 5 CCS L18 IN MNFLMNT 4 STRAND NNBSRBBL (Suture) ×2 IMPLANT
SUTURE STAINLESS STEEL STEEL SILVER 5 (Suture) ×2
SUTURE VICRYL 0 CT1 36IN (Suture) ×6 IMPLANT
SYSTEM VASOVIEW VESSEL HARVEST (Endoscopic Supplies) ×3 IMPLANT
TAPE SRG CLTH MDPR 10YDX2IN LF NS STD (Tape) ×2
TAPE SURGICAL L10 YD X W2 IN STANDARD (Tape) ×4
TAPE SURGICAL L10 YD X W2 IN STANDARD MEDIPORE CLOTH (Tape) ×4 IMPLANT
TIP SCT VNYL LG ARG YNKR SLC-TRL 24FR LF (Disposable Instruments) ×1
TIP SUCTION SELEC-TROL LARGE OD24 FR (Disposable Instruments) ×2
TIP SUCTION SELEC-TROL LARGE OD24 FR VINYL (Disposable Instruments) ×2 IMPLANT
TOWEL L26 IN X W17 IN COTTON PREWASH DELINT BLUE ACTISORB DELUXE (Procedure Accessories) ×4 IMPLANT
TOWEL SRG CTTN 26X17IN LF STRL PREWASH (Procedure Accessories) ×6
TUBE BAIR HUGGER WARMING (Procedure Accessories) ×3 IMPLANT
TUBING INSUFFLATION .1 (Tubing) ×3 IMPLANT
WATER STERILE PLASTIC POUR BOTTLE 1000 (Irrigation Solutions) ×2
WATER STERILE PLASTIC POUR BOTTLE 1000 ML (Irrigation Solutions) ×2 IMPLANT
WATER STRL 1000ML PLS PR BTL LF (Irrigation Solutions) ×1
WAX BN STRL 2.5G NTR (Hemostat) ×2
WAX BONE 2.5 G NATURAL (Hemostat) ×4 IMPLANT
WIPE PERSONAL L7.9 IN X W7.9 IN POST INSERTION FOLEY SURESTEP PULP (Patient Supply) ×2 IMPLANT
WIPE PRSNL PULP PP SRSTP 7.9X7.9IN LF NS (Patient Supply) ×3
WRAP CMPR POR CBN 5YDX4IN LF STRL SLFADH (Bandage) ×1
WRAP COMPRESSION L5 YD X W4 IN SELF (Bandage) ×2
WRAP COMPRESSION L5 YD X W4 IN SELF ADHERENT ELASTIC LIGHTWEIGHT HAND (Bandage) ×2 IMPLANT

## 2016-06-12 NOTE — Brief Op Note (Signed)
BRIEF OP NOTE    Date Time: 06/12/16 11:17 AM    Patient Name:   Jacqueline Weber    Date of Operation:   06/12/2016    Providers Performing:   Surgeon(s):  Bjorn Loser, MD    Assistant (s):   Circulator: Leanne Chang, RN  Scrub Person: Kenney Houseman  First Assistant: Dellinger, Lonie Peak, RN; Areta Haber, RN  Second Assistant: Sandrea Hammond, RN  Preceptor: Elenore Rota, RN; Debbora Presto, RN  CV Tech: Armida Sans P    Operative Procedure:   Procedure(s):  Coronary Artery Bypass x 4.  Endoscopic, Left Leg Greater Saphenous Vein Harvest    Preoperative Diagnosis:   Pre-Op Diagnosis Codes:     * Coronary artery disease involving native coronary artery of native heart without angina pectoris [I25.10]    Postoperative Diagnosis:   Post-Op Diagnosis Codes:     * Coronary artery disease involving native coronary artery of native heart without angina pectoris [I25.10]    Anesthesia:   General    Estimated Blood Loss:    250 mL    Implants:   * No implants in log *    Drains:   24Fr blake drains x2    Specimens:       Findings:   Diffuse CAD    Complications:   none      Signed by: Bjorn Loser, MD                                                                           [place]

## 2016-06-12 NOTE — Op Note (Signed)
[place]                              Operative  Report        Patient  Name:             Jacqueline Weber, Jacqueline Weber  MRN:                       [id number]  DOB:                       03-Aug-1951  Surgery  Date:             06/12/2016  Performing  Facility:      [facility]  Admission  Date:  Surgeon:                   Meiko Ives,M.D.  Ref Cardiologist:          Pollock,Dean,M.D.        Procedure(s):    CABGx  4     Clinical  History  A cardiac  surgery consult  has been requested  for Jacqueline Weber  a 65 y.o  female from [place],  [place] of [place] (05/2016),  HTN, HLD, CKD,  CVA  (no residual)  who underwent  a cardiac catherization  and was  found  have multivessel  disease  requiring surgical  revascularization.   In  September  she had an admission  to [facility] on 9/129/15  with a chief  complaints  of SOB. She had  hypertensive urgency,  she was placed  on  NTG gtt  for BP managment.  she also ruled in  for a NSTEMI with  a  troponin  peak of 8.02. In  Febuary 2017 she  had a nuclear stress  test  that was  abnormal, small  sized , mild intensity  inferoapical  ischemia.   At that time a  cardiac catherization  was deferred  relate  to elevated  kidney function,  during hospitalization  cath was  also  deferred.  She was then readmitted  on 9/27  with SOB and chest  pain,  at that  time she didn't have  an elevation  in her cardiac enzymes,  blood pressure  was once again  not well controlled.  She underwent  a  cardiac  catherization on  06/06/2016 found to  have multivessel  diseasse.  TEE was done on  9/27 to rule out  dissection, CT not  ordered  r/t kidney function  EF 70% mild MR/TR  noted.     Preliminary  STS Risk Score  Mortality  2.85%  Morbitity  and Mortality 29.5%  CBC  WBC:  6.23; HB: 10.0;  Hct: 31.3; PLT:  227;  BMP  NA:  127;    K: 4.0;     BUN: 23;  Creatinine:  2.3; Glucose:  110  9/12 Lipids    Total Cholesterol:  186; Triglycerides:  153; HDL:  47;  LDL: 108;      VLDL: 31;  Alk Phos:  58;  ALT: 10;     AST: 14  9/12TP:  #1 0.05; #2 8.03;  #3: 7.24;  PT: 12.9;  PTT: 40; INR:  1.0; HbA1c: 6.3;  LEFT HEART CATHERIZATION [facility]  10/3 Dr Sallyanne Havers  FINDINGS:  HEMODYNAMICS:  Left ventricular enddiastolic  pressure is 10 to 15 mmHg.  There is  no gradient across the aortic  valve.  The patient has severe  essential  hypertension.  Despite sedation,  her blood pressure in the  laboratory was approximately  185/80.     LEFT VENTRICLE:  Not injected.     ABDOMINAL AORTA:  The abdominal aorta is normal  caliber.  There is no evidence of  atherosclerotic disease of  the proximal renal arteries.     RIGHT CORONARY:  Large, dominant calcified vessel,  which has 20% lesions in the mid  and distal segment.  There  is an 80% to 90% stenosis in the  moderately large  right posterior descending  coronary artery.  There is a 60% lesion  in the moderatesized right  posterolateral branch.     LEFT CORONARY ARTERY:  There is calcification of the  left main trunk, LAD and circumflex.  The left main trunk is narrowed  by 20% distally.  The left anterior  descending  has a 90% proximal stenosis  with a long segment of 50% to 60%  stenosis in the mid portion  of the vessel.  There is an 80% stenosis  of the  moderatesized first diagonal  branch.  The circumflex has a 90%  proximal stenosis above the  origin of a large first marginal branch.     FINAL DIAGNOSES:  1.  Severe 3vessel coronary  disease with known normal LV function,  (by  echocardiogram).  2.  Recent nonQ myocardial  infarction.  3.  Severe hypertension.  4.  Insulindependent diabetes.  5.  Hyperlipidemia.  6.  Remote cerebrovascular  accident with complete recovery.  7.  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     PLAN:  1.  Renal function will be  followed carefully.  It should be noted  that 90  mL of contrast material was utilized   during the procedure.  2.  Her films will be reviewed  by the cardiac surgeons for possible  coronary artery bypass graft surgery.  3.  Blood pressure medication  adjustment per nephrology.  4.  The patient will follow up  in our office after hospital  discharge.  She  will also follow up with the nephrologist  and with her primary  physician.     TRANSTHORACIC ECHOCARDIOGRAM 05/17/2016  IMPRESSION:        1.  The quality of the study is  technically adequate for  interpretation.  2.  The left ventricle is normal  in size. Left ventricular systolic  function is normal. There are  no regional wall motion abnormalities.  Estimated EF is 65%. There is  mild concentric left ventricular  hypertrophy. There is evidence  of abnormal diastolic LV function.  3.  The left atrium is moderately  dilated in size.  4.  The aortic valve is trileaflet.  Sclerotic aortic valve. Normal  valve  excursion is present. There is  mild to moderate aortic  insufficiency. No  aortic stenosis is present. The  aortic root is normal in size.  5.  The mitral valve is structurally  normal. Mild mitral  insufficiency  is present.  6.  The right ventricle is normal  in size and function.  7.  The right atrium is normal  in size.  8.  The tricuspid valve is structurally  normal. There is no  significant  tricuspid insufficiency.  9. The pulmonic valve is structurally  normal. There is no  significant  pulmonic insufficiency.  10. There is no evidence of pulmonary  hypertension.  11. Trace pericardial effusion..  12. The atrial septum is structurally  normal. No shunt by color flow  doppler.     CONCLUSION:     Left ventricle is normal in size  with normal systolic function. LVEF  65%.  Diastolic dysfunction noted.  Mild concentric left ventricular  hypertrophy.  Left atrium moderately dilated.  Sclerotic aortic valve. Mild to  moderate aortic regurgitation.  Mild mitral regurgitation.  Trace pericardial effusion        TRANSESOPHAGEAL ECHOCARDIOGRAM   05/31/16  IMPRESSION:        The left ventricle is normal in  size. Left ventricular function is  hyperdynamic. Regional wall motion  abnormalities are not present.  Estimated EF is 70%.     The left atrium and left atrial  appendage demonstrate no clot.     The aortic valve is trileaflet,  sclerotic.  There is mild aortic  insufficiency. There is no visual  aortic stenosis. The aortic root  is  grossly normal.     The mitral valve is structurally  intact. There is mild mitral  insufficiency.     The right ventricle is normal  in size and normal in function.     The right atrium demonstrates  no clot.     The tricuspid valve is structurally  intact. There is mild tricuspid  insufficiency.  The pulmonic valve is structurally  not well seen. There is trace  pulmonic insufficiency.     No pericardial effusion or intracardiac  masses.     The atrial septum is structurally  intact. There is no shunt by color  flow doppler.     The descending thoracic aorta  was visualized and appeared to have  mild,  nonmobile mild detectable atherosclerosis,  but no thrombus, or  spontaneous echo contrast.  There is no evidence of a dissection  or aneurysm formation in the  ascending or descending aorta.  Limited visualization of the aortic  arch  revealed no clear aortic dissection.  CONCLUSION:     1. No evidence of an aortic dissection  2. Dynamic left ventricular function  with an EF of 70%  3. Adequately functioning cardiac  valves     CAROTID ULTRASOUND 06/06/2016  RIGHT CAROTID: Imaging demonstrates  mild to moderate calcified  plaque  near the carotid bulb. Doppler  is normal.     LEFT CAROTID: Imaging demonstrates  mild calcified plaque near the  bulb.  Doppler is normal.     PROXIMAL VERTEBRAL ARTERIES: Patent.  Antegrade flow.     MEASURED VELOCITIES (CM/S):     Right CCA:              87  Right proximal ICA (peak systolic):  741  Right proximal ICA (end diastolic):  26  Right ECA:               106     Left CCA:                  86  Left proximal ICA (peak systolic):  97  Left proximal ICA (end diastolic):  24  Left ECA:            140     IMPRESSION:  Mild to moderate bilateral carotid  bulb calcified plaque  without hemodynamically significant  stenosis.           Pre Procedure  STS mortality risk score was determined  and discussed with the  patient All of the patient's questions  were answered to their  satisfaction.  Informed consent  was obtained.     Description of Early Procedure  The patient was brought to the  operating room for urgent surgery and  placed in the supine position.   General anesthesia was induced.  All  monitoring lines were placed percutaneously.   The neck, chest, and  abdomen were prepped and draped  in the usual sterile fashion.  The  operative approach in this case  was that of full sternotomy and   cardiopulmonary bypass.  Adhesions were dissected from  the anterior   mediastinum and the margins were suspended.      Myocardial Protection   The method of delivery of myocardial protection was  that of cold   blood cardioplegic solution, delivered in antegrade  and retrograde   fashion. Cardioplegia was delivered through the aortic  root catheter   and through the coronary sinus catheter.  Reperfusion  of the   myocardium was accomplished with warm blood cardioplegia  solution.   Myocardial protection was excellent.      Operative Technique         The patient was then fully heparinized, and the conduit  was prepared   for grafting.  Arterial cannulation was accomplished.   Venous   cannulation was accomplished in the right atrium using  a multi   staged venous cannula.  The patient was connected to  the heart/lung   machine. The cross clamp was applied to the ascending  aorta and   myocardial arrest was achieved.  Coronary bypass grafting  was   performed as noted below.                                          Distal      Distal       Proximal                   Artery        AnastomosisAnastomosis    Anastomosis  SeqVessel    mm  QualityConduitSite        ConfigurationConfiguration                                 Posterior                          Vein                            aortasingle  1  PDA       1.74fair          Descending  End to side                          graft                           cross clamp                                 (  PDA)                          Vein   Obtuse                   aortasingle  2  Circumflex2.0 fair                      End to side                          graft  Marginal  1              cross clamp                          Vein   Diagonal  1              aortasingle  3  Diagonal 12.0 fair                      End to side                          graft                           cross clamp                          In     Distal  LAD     Proximal  4            2.0 fair   Situ               End to side     LAD                          LIMA         Closing Phase   After careful maneuvers to evacuate the air, the cross  clamp was   removed.  The coronary sinus catheter was then removed.   After   adequate rewarming, the patient was then weaned from  cardiopulmonary   bypass using no inotropes.  The heparin was reversed  with protamine.   The patient was decannulated, and following meticulous  hemostasis,   the sternum was approximated with interrupted #5 stainless  steel   wire while the linea alba and fascia were closed with  Vicryl.  Hemodynamic stability was achieved  and the chest was closed in  layers after leaving mediastinal  and left pleural tubes in place.  Sponge and needle counts were  correct and the patient was  transferred to CVICU with stable  vital signs.  A message was  conveyed to the family that the  operation was completed.              Adely Facer L. Conchita Paris, M.D.   electronically  signed by SarinEr on 06/12/2016  12:00:15 PM with status of Final

## 2016-06-12 NOTE — Progress Notes (Signed)
Patient extubated at 1748 to [place].  Oxygen saturations 100%.  VSS.  Patient resting comfortably; denies pain.  Family at the bedside. Patient being evaluated for potential RABBIT.  Will continue to monitor.

## 2016-06-12 NOTE — Progress Notes (Signed)
Patient seen and examined, H&P reviewed.  There have been no interval changes.  Informed consent obtained.  Proceed with CABG.    Jacqueline Wamboldt L. Conchita Paris, MD  Cardiac Surgeon

## 2016-06-12 NOTE — Progress Notes (Signed)
[facility]  Progress Note    Assessment:  1. Subacute on CRF; non oliguric (overall rise in serum creatinine from 1/17 -> 9/17); stable  2. ASCAD; transfer from [facility]; s/p CABG x4 vessels  3. Metabolic acidosis; non anion gap; improving  4. Anemia; microcytic/hypochromic  5. Hyponatremia; improving  6. MMPs    Plan:  1.  Continue NaHCO3 @ 1300mg  enterally TID  2.  Continue current medication regimen - metabolic/hemodynamic status stable; cardiac indices stable  3.  qam labs to include Mg/PO4    Further recommendations to follow    Thank you    Elmyra Ricks, MD  Office - [phone]  [employer] - [phone]  ++++++++++++++++++++++++++++++++++++++++++++++++++++++++++++++  Subjective:  No new complaints    Medications:  Scheduled Meds:  Current Facility-Administered Medications   Medication Dose Route Frequency   . amiodarone  200 mg Oral Q12H [place]   . [START ON 06/13/2016] aspirin EC  325 mg Oral Daily   . cefuroxime  1.5 g Intravenous Q8H   . chlorhexidine  15 mL Mouth/Throat Q12H [place]   . famotidine  20 mg Oral Daily   . hydrALAZINE  50 mg Oral Q8H [place]   . mupirocin   Nasal Q12H [facility]   . polyethylene glycol  17 g Oral Daily   . propranolol  10 mg Oral 4 times per day   . senna-docusate  1 tablet Oral QHS   . sodium bicarbonate  1,300 mg per OG tube Once   . sodium bicarbonate  1,300 mg Oral TID     Continuous Infusions:  . sodium chloride 85 mL/hr at 06/12/16 1600   . insulin (regular) infusion 1.8 Units/hr (06/12/16 1638)   . niCARdipine 2.5 mg/hr (06/12/16 1545)   . nitroglycerin     . norepinephrine (LEVOPHED) infusion     . propofol Stopped (06/12/16 1245)     PRN Meds:acetaminophen, bisacodyl, cloNIDine, Nursing communication: Adult Hypoglycemia Treatment Algorithm **AND** dextrose **AND** dextrose **AND** glucagon (rDNA) **AND** Nursing communication: Document Significant Event Note, dextrose, hydrALAZINE, HYDROmorphone, insulin lispro, insulin lispro, lactated  ringers, naloxone, niCARdipine, nitroglycerin, norepinephrine (LEVOPHED) infusion, ondansetron **OR** ondansetron, oxyCODONE-acetaminophen, traMADol    Objective:  Vital signs in last 24 hours:  Temp:  [98 F (36.7 C)-98.4 F (36.9 C)] 98.1 F (36.7 C)  Heart Rate:  [61-93] 68  Resp Rate:  [10-19] 14  BP: (120-202)/(62-91) 121/66  Arterial Line BP: (118-137)/(49-62) 137/54  FiO2:  [40 %-100 %] 40 %  Intake/Output last 24 hours:    Intake/Output Summary (Last 24 hours) at 06/12/16 1652  Last data filed at 06/12/16 1600   Gross per 24 hour   Intake          8233.64 ml   Output              712 ml   Net          7521.64 ml     Intake/Output this shift:  I/O this shift:  In: 4335.31 [I.V.:2928.31; Blood:267; NG/GT:40; IV Piggyback:1100]  Out: 832 [Urine:372; Blood:250; Chest Tube:90]    Physical Exam:  Seen post-op   Gen: Intubated   CV: S1 S2 N RRR   Chest: No rales/no rhonchi   Ab: ND NT soft no HSM +BS   Ext: Trace edema bilateral LEs    Labs:    Recent Labs  Lab 06/12/16  1148 06/12/16  0535 06/11/16  0031 06/10/16  0527   Glucose 90 117* 103* 89  BUN 16.0 18.0 22.0* 23.0*   Creatinine 1.9* 2.0* 2.1* 2.4*   Calcium 8.1* 8.3* 8.0* 8.1*   Sodium 136 134* 130* 127*   Potassium 5.0 4.0 4.0 4.0   Chloride 112* 108 105 103   CO2 18* 16* 16* 14*   Phosphorus  --  3.5 3.9 4.3   Magnesium 3.2* 2.4 1.5* 1.6       Recent Labs  Lab 06/12/16  1148 06/12/16  0535 06/11/16  0031   WBC 5.99 7.19 5.11   Hgb 9.0* 9.3* 8.8*   Hematocrit 28.0* 29.2* 27.4*   MCV 73.5* 68.2* 67.7*   MCH 23.6* 21.7* 21.7*   MCHC 32.1 31.8* 32.1   RDW 21* 18* 18*   MPV 10.9 10.4 10.9   Platelets 129* 235 206

## 2016-06-12 NOTE — Progress Notes (Addendum)
CV SURGERY ICU PROGRESS NOTE      POD: 1    Surgery:  Procedure(s):  Coronary Artery Bypass x 4.  Endoscopic, Left Leg Greater Saphenous Vein Harvest      EF: 70%  Surgeon: Bjorn Loser, MD      Assessment/Plan:   Active Problems:    NSTEMI (non-ST elevated myocardial infarction)      Neurological/?:    Intact   Hx CVA in past, no deficits    PT/OT    Cardiovascular:   CAD s/p CABG: HD stable off all gtts   Hypertension: allowing permissive HTN for renal pefusion, SBP <140. Resume home Coreg and Hydralazine, Resume home Clonidicine, Norvasc, and Cozaar as needed.    Core Measures: Aspirin - yes; Beta blocker - yes; ACE - NA; Statin - yes   Pacing wires: yes    Pulmonary:   Extubated to NC   Chest tubes (past 12 hours): 130   Chest X-ray: pending    Gastrointestinal:   Nutrition: Advance as tolerated   GI prophylaxis: Pepcid    Renal/GU:   Volume Overload +4.3L/24hrs, little response to 40 IV Lasix, increase   CKD stage IV with baseline Creatinine 2.0-2.4-> 2.3 today    Nephrology following   Metabolic Acidosis: 9.03 this am with HCO3 18, getting po Bicarb   Foley: In for strict I&Os    Hematological:   Acute on Chronic Anemia: Hematocrit: 32, Platelets: 172   DVT prophylaxis: SCD    Infectious Disease:   Afebrile; WBC: 13k   Lines: standard postop    Endocrinological:   DM II- insulin dependent, 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 May need endo consult   HgbA1c: 6.3    Plan:   Transfer to SDU      Subjective:   Interval History: No issues overnight      Objective:   Vital signs for last 24 hours:  Temp:  [98.1 F (36.7 C)] 98.1 F (36.7 C)  Heart Rate:  [61-93] 73  Resp Rate:  [10-17] 15  BP: (114-202)/(60-91) 120/63  Arterial Line BP: (118-158)/(49-64) 137/53  FiO2:  [40 %-100 %] 40 %    Neuro: Intact, moves all  Cardiac: RRR no murmur or rub  Lungs: Decreased bilat, but clear  Abdomen: soft NT ND BS hypoactive  Extremities: warm pulse intact 1+ edema LE  Wounds: C/D/I      Invasive ICU Hemodynamics:  PAP:  (21-33)/(12-20) 29/17  CVP:  [7 mmHg-16 mmHg] 15 mmHg  CO:  [3.7 L/min-4.9 L/min] 4.3 L/min  CI:  [2.3 L/min/m2-3 L/min/m2] 2.6 L/min/m2    Vent Settings:  FiO2:  [40 %-100 %] 40 %  S RR:  [10] 10  S VT:  [370 mL] 370 mL  PEEP/EPAP:  [0 cm H20-5 cm H20] 5 cm H20      Medications:   Scheduled Meds:  Current Facility-Administered Medications   Medication Dose Route Frequency   . amiodarone  200 mg Oral Q12H [place]   . aspirin EC  325 mg Oral Daily   . carvedilol  3.125 mg Oral Q12H [place]   . chlorhexidine  15 mL Mouth/Throat Q12H [place]   . famotidine  20 mg Oral Daily   . hydrALAZINE  25 mg Oral Q8H [facility]   . mupirocin   Nasal Q12H [facility]   . polyethylene glycol  17 g Oral Daily   . senna-docusate  1 tablet Oral QHS   . sodium bicarbonate  50 mEq Intravenous Once   .  sodium bicarbonate  1,300 mg Oral TID     Continuous Infusions:  . sodium chloride 85 mL/hr at 06/13/16 0500   . insulin (regular) infusion Stopped (06/13/16 0531)   . niCARdipine 10 mg/hr (06/13/16 0510)   . nitroglycerin     . norepinephrine (LEVOPHED) infusion     . propofol Stopped (06/12/16 1245)         Labs:   CBC:   Recent Labs      06/13/16   0207   WBC  13.68*   Hgb  10.3*   Hematocrit  32.1*   Platelets  172     BMP:   Recent Labs      06/13/16   0207   Sodium  139   Potassium  5.0   Chloride  112*   CO2  18*   BUN  22.0*   Creatinine  2.3*   Glucose  120*   Magnesium  3.0*   Calcium  8.2*   Phosphorus  4.5     LFTs: No results for input(s): AST, ALT, ALKPHOS, BILITOTAL, BILIDIRECT, BILIINDIRECT, PROT, ALB in the last 72 hours.  INR:   Recent Labs      06/12/16   1148   PT  15.6*   PT INR  1.2*     ABG:   Recent Labs      06/13/16   0207   pH, Arterial  7.289*   pO2, Arterial  153.0*   pCO2, Arterial  40.6   HCO3, Arterial  18.9*   Base Excess, Arterial  -6.7*         Microbiology (past 7 days):         Radiology (past 7 days):   Chest xray: see above        Christain Sacramento, PA-C  Cardiovascular Surgery    I have seen and examined this patient today and  reviewed all relevant study results.  Agree with assessment and plan as outlined in the above progress note.  Saabir Blyth L. Conchita Paris, MD  Cardiac Surgeon

## 2016-06-12 NOTE — Plan of Care (Signed)
Problem: Pre-op Phase - Cardiac Surgery  Goal: Patient will be ready for surgery  Outcome: Completed Date Met: 06/12/16

## 2016-06-12 NOTE — Plan of Care (Signed)
Problem: Safety  Goal: Patient will be free from injury during hospitalization  Outcome: Progressing   06/12/16 2301   Goal/Interventions addressed this shift   Patient will be free from injury during hospitalization  Assess patient's risk for falls and implement fall prevention plan of care per policy;Provide and maintain safe environment;Use appropriate transfer methods     Goal: Patient will be free from infection during hospitalization  Outcome: Progressing   06/12/16 2301   Goal/Interventions addressed this shift   Free from Infection during hospitalization Assess and monitor for signs and symptoms of infection;Monitor lab/diagnostic results       Problem: Pain  Goal: Pain at adequate level as identified by patient  Outcome: Progressing   06/12/16 2301   Goal/Interventions addressed this shift   Pain at adequate level as identified by patient Identify patient comfort function goal;Assess for risk of opioid induced respiratory depression, including snoring/sleep apnea. Alert healthcare team of risk factors identified.;Reassess pain within 30-60 minutes of any procedure/intervention, per Pain Assessment, Intervention, Reassessment (AIR) Cycle       Problem: Discharge Barriers  Goal: Patient will be discharged home or other facility with appropriate resources  Outcome: Progressing   06/12/16 2301   Goal/Interventions addressed this shift   Discharge to home or other facility with appropriate resources Provide appropriate patient education       Problem: Hemodynamic Status: Cardiac  Goal: Stable vital signs and fluid balance  Outcome: Progressing   06/12/16 2301   Goal/Interventions addressed this shift   Stable vital signs and fluid balance Monitor/assess vital signs and telemetry per unit protocol;Assess signs and symptoms associated with cardiac rhythm changes;Monitor intake/output per unit protocol and/or LIP order;Monitor lab values       Problem: Moderate/High Fall Risk Score >5  Goal: Patient will remain  free of falls  Outcome: Progressing   06/12/16 2301   OTHER   Moderate Risk (6-13) LOW-Fall Interventions Appropriate for Low Fall Risk       Problem: Renal Instability  Goal: Fluid and electrolyte balance are achieved/maintained  Outcome: Progressing   06/12/16 2301   Goal/Interventions addressed this shift   Fluid and electrolyte balance are achieved/maintained  Monitor intake and output every shift;Monitor/assess lab values and report abnormal values     Goal: Free from infection  Outcome: Progressing   06/12/16 2301   Goal/Interventions addressed this shift   Free from infection Monitor/assess for signs and symptoms of infection       Problem: Post-op Phase - Cardiac Surgery  Goal: Effective breathing pattern is maintained  Outcome: Progressing   06/12/16 2301   Goal/Interventions addressed this shift   Effective breathing pattern is maintained  Maintain SpO2 level per LIP order;Position patient for maximum ventilatory efficiency with HOB to a minimum of 30 degrees when hemodynamically stable;Monitor for sleep apnea     Goal: Cardiac output is adequate  Outcome: Progressing   06/12/16 2301   Goal/Interventions addressed this shift   Cardiac output is adequate  Monitor/assess vital signs, hemodynamic parameters, and temperature per LIP orders;Monitor/assess I&O including chest tube every hour for first 24 hours, then every 4 hours when telemetry/step-down status;Monitor/assess cardiac output/index per LIP order     Goal: Patient will remain free from post-op complications  Outcome: Progressing   06/12/16 2301   Goal/Interventions addressed this shift   Patient will remain free from post-op complications  Maintain pericare/Foley care per protocol;Assess surgical incision/wound site and treat per LIP order     Goal: Mobility/activity is  maintained at optimal level  Outcome: Progressing   06/12/16 2301   Goal/Interventions addressed this shift   Mobility/activity is maintained at optimal level  Increase mobility as  tolerated/progressive mobility;Get patient OOB to chair after 4-8 hours of extubation     Goal: Nutritional intake is adequate  Outcome: Progressing   06/12/16 2301   Goal/Interventions addressed this shift   Nutritional intake is adequate Monitor daily weights;Encourage/perform oral hygiene as appropriate       Problem: Compromised Tissue integrity  Goal: Damaged tissue is healing and protected  Outcome: Progressing   06/12/16 2301   Goal/Interventions addressed this shift   Damaged tissue is healing and protected  Monitor/assess Braden scale every shift;Reposition patient every 2 hours and as needed unless able to reposition self;Increase activity as tolerated/progressive mobility     Goal: Nutritional status is improving  Outcome: Progressing   06/12/16 2301   Goal/Interventions addressed this shift   Nutritional status is improving Assist patient with eating;Allow adequate time for meals

## 2016-06-12 NOTE — Plan of Care (Addendum)
Problem: Safety  Goal: Patient will be free from injury during hospitalization  Outcome: Progressing    Goal: Patient will be free from infection during hospitalization  Outcome: Progressing  Patient is A&Ox4. SR on tele. Denies pain, SOB, palpitations, and dizziness at this time.  BP has been on the upper 180s prn hydralazine given. Room air, sating 96%.  I&O monitored and documented. NPO after midnight for CABG tomorrow.  Pt. needs met. Currently resting. Family at the bedside. No acute change during the shift. Safety precautions in place. Intentional hourly rounding complete. Will continue to monitor.        Problem: Hemodynamic Status: Cardiac  Goal: Stable vital signs and fluid balance  Outcome: Progressing

## 2016-06-12 NOTE — Plan of Care (Signed)
Problem: Safety  Goal: Patient will be free from injury during hospitalization  Outcome: Progressing   06/12/16 0316   Goal/Interventions addressed this shift   Patient will be free from injury during hospitalization  Assess patient's risk for falls and implement fall prevention plan of care per policy;Provide and maintain safe environment;Use appropriate transfer methods;Ensure appropriate safety devices are available at the bedside;Include patient/ family/ care giver in decisions related to safety;Hourly rounding;Assess for patients risk for elopement and implement Elopement Risk Plan per policy;Provide alternative method of communication if needed (communication boards, writing)     Goal: Patient will be free from infection during hospitalization  Outcome: Progressing   06/12/16 1645   Goal/Interventions addressed this shift   Free from Infection during hospitalization Assess and monitor for signs and symptoms of infection;Monitor lab/diagnostic results       Problem: Pain  Goal: Pain at adequate level as identified by patient  Outcome: Progressing   06/12/16 1645   Goal/Interventions addressed this shift   Pain at adequate level as identified by patient Identify patient comfort function goal;Assess pain on admission, during daily assessment and/or before any "as needed" intervention(s);Reassess pain within 30-60 minutes of any procedure/intervention, per Pain Assessment, Intervention, Reassessment (AIR) Cycle;Evaluate if patient comfort function goal is met;Offer non-pharmacological pain management interventions;Consult/collaborate with Physical Therapy, Occupational Therapy, and/or Speech Therapy       Problem: Discharge Barriers  Goal: Patient will be discharged home or other facility with appropriate resources  Outcome: Progressing   06/12/16 1645   Goal/Interventions addressed this shift   Discharge to home or other facility with appropriate resources Provide appropriate patient education;Provide information  on available health resources;Initiate discharge planning       Problem: Hemodynamic Status: Cardiac  Goal: Stable vital signs and fluid balance  Outcome: Progressing   06/12/16 1645   Goal/Interventions addressed this shift   Stable vital signs and fluid balance Monitor/assess vital signs and telemetry per unit protocol;Assess signs and symptoms associated with cardiac rhythm changes;Monitor intake/output per unit protocol and/or LIP order;Monitor lab values       Problem: Moderate/High Fall Risk Score >5  Goal: Patient will remain free of falls   06/11/16 2000   OTHER   Moderate Risk (6-13) MOD-Floor mat at bedside (where available) if appropriate;MOD-([facility] Only) Apply yellow "Fall Risk" arm band;MOD-Place Fall Risk level on whiteboard in room       Problem: Renal Instability  Goal: Fluid and electrolyte balance are achieved/maintained  Outcome: Progressing   06/12/16 1645   Goal/Interventions addressed this shift   Fluid and electrolyte balance are achieved/maintained  Monitor intake and output every shift;Monitor/assess lab values and report abnormal values;Observe for seizure activity and initiate seizure precautions if indicated;Observe for cardiac arrhythmias     Goal: Free from infection  Outcome: Progressing   06/12/16 1645   Goal/Interventions addressed this shift   Free from infection Monitor/assess for signs and symptoms of infection;Assess need for indwelling catheter every shift and discuss with LIP       Problem: Post-op Phase - Cardiac Surgery  Goal: Effective breathing pattern is maintained  Outcome: Progressing   06/12/16 1645   Goal/Interventions addressed this shift   Effective breathing pattern is maintained  Position patient for maximum ventilatory efficiency with HOB to a minimum of 30 degrees when hemodynamically stable;Maintain SpO2 level per LIP order;Reinforce use of ordered respiratory interventions (i.e. CPAP, BiPAP, Incentive Spirometer, Acapella);Monitor for sleep apnea     Goal: Cardiac  output is adequate  Outcome: Progressing   06/12/16 1645   Goal/Interventions addressed this shift   Cardiac output is adequate  Monitor/assess vital signs, hemodynamic parameters, and temperature per LIP orders;Monitor/assess I&O including chest tube every hour for first 24 hours, then every 4 hours when telemetry/step-down status;Monitor/assess cardiac output/index per LIP order;Monitor/assess neurovascular status (i.e. pulses, capillary refill, pain, paresthesia, presence of edema);Evaluate patient for RABBIT criteria ([place] only)     Goal: Patient will remain free from post-op complications  Outcome: Progressing   06/12/16 1645   Goal/Interventions addressed this shift   Patient will remain free from post-op complications  Maintain sternal precautions as described in patient education;Maintain pericare/Foley care per protocol;VTE prevention: Administer anticoagulants and/or apply anti-embolism stockings/devices as ordered;Monitor/assess blood glucose per LIP orders;Assess surgical incision/wound site and treat per LIP order;Assess dressing and reinforce or change per LIP order     Goal: Mobility/activity is maintained at optimal level  Outcome: Progressing   06/12/16 1645   Goal/Interventions addressed this shift   Mobility/activity is maintained at optimal level  Plan activities to conserve energy. Plan rest periods.     Goal: Nutritional intake is adequate  Outcome: Progressing   06/12/16 1645   Goal/Interventions addressed this shift   Nutritional intake is adequate Consult/collaborate with Clinical Nutritionist

## 2016-06-12 NOTE — Transfer of Care (Addendum)
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  Vein to PDA  Vein to OM  Vein to Diagonal  Endoscopic, Left Leg Greater Saphenous Vein Harvest - Left Leg Incision (medially, groin to ankle) @ 0742  Vein out of leg @ 0826  Vein prep complete @ 0845  Total time = 63 minutes    Anesthesia type: General ETT    Patient location:CVICU    Last vitals:   Vitals:    06/12/16 1132   BP: 121/62   Pulse: 80   Resp: (!) 10   Temp:    SpO2: 100%       Post pain: Patient not complaining of pain, continue current therapy      Mental Status:sedated    Respiratory Function: intubated    Cardiovascular: A-paced    Nausea/Vomiting: patient not complaining of nausea or vomiting    Hydration Status: adequate    Post assessment: no apparent anesthetic complications and no reportable events   Patient transported to ICU on monitors, ambu bag ventilation.  Bedside report updated to CVICU team. RT placed pt on ventilator. ABP 121/62, CVP 8, PAP 21/13    Signed by: Vickki Muff [facility]  06/12/16 11:33 AM

## 2016-06-12 NOTE — Plan of Care (Incomplete)
Problem: Safety  Goal: Patient will be free from injury during hospitalization  Outcome: Progressing   06/12/16 0316   Goal/Interventions addressed this shift   Patient will be free from injury during hospitalization  Assess patient's risk for falls and implement fall prevention plan of care per policy;Provide and maintain safe environment;Use appropriate transfer methods;Ensure appropriate safety devices are available at the bedside;Include patient/ family/ care giver in decisions related to safety;Hourly rounding;Assess for patients risk for elopement and implement Elopement Risk Plan per policy;Provide alternative method of communication if needed (communication boards, writing)     Goal: Patient will be free from infection during hospitalization  Outcome: Progressing   06/12/16 1622   Goal/Interventions addressed this shift   Free from Infection during hospitalization Assess and monitor for signs and symptoms of infection;Monitor lab/diagnostic results       Problem: Pain  Goal: Pain at adequate level as identified by patient  Outcome: Progressing   06/12/16 1622   Goal/Interventions addressed this shift   Pain at adequate level as identified by patient Assess pain on admission, during daily assessment and/or before any "as needed" intervention(s);Reassess pain within 30-60 minutes of any procedure/intervention, per Pain Assessment, Intervention, Reassessment (AIR) Cycle;Evaluate if patient comfort function goal is met;Identify patient comfort function goal;Offer non-pharmacological pain management interventions

## 2016-06-12 NOTE — Plan of Care (Deleted)
Problem: Safety  Goal: Patient will be free from injury during hospitalization  Outcome: Progressing    Goal: Patient will be free from infection during hospitalization  Outcome: Progressing      Problem: Pain  Goal: Pain at adequate level as identified by patient  Outcome: Progressing      Problem: Discharge Barriers  Goal: Patient will be discharged home or other facility with appropriate resources  Outcome: Progressing      Problem: Hemodynamic Status: Cardiac  Goal: Stable vital signs and fluid balance  Outcome: Progressing      Problem: Moderate/High Fall Risk Score >5  Goal: Patient will remain free of falls  Outcome: Progressing      Problem: Renal Instability  Goal: Fluid and electrolyte balance are achieved/maintained  Outcome: Progressing    Goal: Free from infection  Outcome: Progressing      Problem: Post-op Phase - Cardiac Surgery  Goal: Effective breathing pattern is maintained  Outcome: Progressing    Goal: Cardiac output is adequate  Outcome: Progressing    Goal: Patient will remain free from post-op complications  Outcome: Progressing    Goal: Mobility/activity is maintained at optimal level  Outcome: Progressing    Goal: Nutritional intake is adequate  Outcome: Progressing      Problem: Compromised Tissue integrity  Goal: Damaged tissue is healing and protected  Outcome: Progressing    Goal: Nutritional status is improving  Outcome: Progressing

## 2016-06-12 NOTE — Anesthesia Preprocedure Evaluation (Signed)
Anesthesia Evaluation    AIRWAY    Mallampati: III    TM distance: >3 FB  Neck ROM: full  Mouth Opening:full   CARDIOVASCULAR    cardiovascular exam normal, regular and normal       DENTAL    no notable dental hx     PULMONARY    pulmonary exam normal and clear to auscultation     OTHER FINDINGS              Relevant Problems   No active problems are marked relevant to this note.               Anesthesia Plan    ASA 4     general                     intravenous induction   Detailed anesthesia plan: general endotracheal  Monitors/Adjuncts: arterial line, CVP, BIS and PA cath    Post Op: post-op ventilation and ICU  Post op pain management: per surgeon    informed consent obtained    Plan discussed with CRNA.      pertinent labs reviewed             ===============================================================  Inpatient Anesthesia Evaluation    Patient Name: Jacqueline Weber,Jacqueline Weber  Surgeon: Bjorn Loser, MD  Patient Age / Sex: 65 y.o. / female    Medical History:     Past Medical History:   Diagnosis Date   . Cataracts, bilateral    . Cerebrovascular accident    . Chronic kidney disease    . CVA (cerebral infarction) 03/2013   . Diabetes mellitus    . Hypercholesteremia    . Hypertension    . Myocardial infarction        No past surgical history on file.      Allergies:     Allergies   Allergen Reactions   . Mosquito (Culex Pipiens) Allergy Skin Test Shortness Of Breath     And  Trouble  breathing   . Lisinopril    . Shellfish-Derived Products Hives     New  allergy  New  allergy   . Penicillins Rash     Swelling,  numbness         Medications:     Current Facility-Administered Medications   Medication Dose Route Frequency Last Rate Last Dose   . 0.9%  NaCl infusion   Intravenous Continuous 50 mL/hr at 06/11/16 2209     . acetaminophen (TYLENOL) tablet 650 mg  650 mg Oral Q4H PRN       . amiodarone (PACERONE) tablet 400 mg  400 mg Oral Q12H   400 mg at 06/11/16 2021   . amLODIPine (NORVASC) tablet 10 mg  10 mg Oral Daily    10 mg at 06/11/16 0951   . aspirin EC EC tablet 325 mg  325 mg Oral Daily   325 mg at 06/11/16 0951   . carvedilol (COREG) tablet 25 mg  25 mg Oral Q12H [place]   25 mg at 06/11/16 2021   . chlorhexidine (PERIDEX) 0.12 % solution 15 mL  15 mL Mouth/Throat Pre-Op       . chlorhexidine (PERIDEX) 0.12 % solution           . cloNIDine (CATAPRES) tablet 0.1 mg  0.1 mg Oral BID PRN       . dextrose (GLUCOSE) 40 % oral gel 15 g of glucose  15 g of glucose Oral  PRN        And   . dextrose (D10W) 10% bolus 125 mL  125 mL Intravenous PRN        And   . glucagon (rDNA) (GLUCAGEN) injection 1 mg  1 mg Intramuscular PRN       . famotidine (PEPCID) tablet 20 mg  20 mg Oral Daily   20 mg at 06/11/16 0950   . hydrALAZINE (APRESOLINE) injection 10 mg  10 mg Intravenous Q6H PRN   10 mg at 06/11/16 2346   . hydrALAZINE (APRESOLINE) tablet 50 mg  50 mg Oral Q8H [place]   50 mg at 06/12/16 0512   . insulin lispro (HumaLOG) injection 1-3 Units  1-3 Units Subcutaneous QHS PRN   1 Units at 06/08/16 2208   . insulin lispro (HumaLOG) injection 1-5 Units  1-5 Units Subcutaneous TID AC PRN   1 Units at 06/11/16 1736   . iron sucrose (VENOFER) injection 200 mg  200 mg Intravenous Q24H [place]   200 mg at 06/11/16 1333   . isosorbide mononitrate (IMDUR) 24 hr tablet 60 mg  60 mg Oral Daily   60 mg at 06/11/16 0951   . naloxone (NARCAN) injection 0.2 mg  0.2 mg Intravenous PRN       . oxyCODONE-acetaminophen (PERCOCET) 5-325 MG per tablet 1 tablet  1 tablet Oral Q4H PRN       . polyethylene glycol (MIRALAX) packet 17 g  17 g Oral Daily   17 g at 06/10/16 0922   . senna-docusate (PERICOLACE) 8.6-50 MG per tablet 1 tablet  1 tablet Oral BID   1 tablet at 06/10/16 1745   . SITagliptin (JANUVIA) tablet 25 mg  25 mg Oral Daily   25 mg at 06/11/16 0950   . traMADol (ULTRAM) tablet 50 mg  50 mg Oral Q4H PRN       . tranexamic acid (CYKLOKAPRON) 1,000 mg in sodium chloride 0.9 % 100 mL IVPB  1,000 mg Intravenous Once       . tranexamic acid (CYKLOKAPRON) 1,320 mg  in sodium chloride 0.9 % 100 mL IVPB  1,320 mg Intravenous Once                  Prior to Admission medications    Medication Sig Start Date End Date Taking? Authorizing Provider   amLODIPine (NORVASC) 10 MG tablet Take 1 tablet (10 mg total) by mouth daily. 05/19/16   Stacey Drain, MD   aspirin EC 325 MG EC tablet Take 1 tablet (325 mg total) by mouth daily. 06/04/14   Clare Gandy, MD   carvedilol (COREG) 25 MG tablet Take 25 mg by mouth 2 (two) times daily with meals.       [provider]   cloNIDine (CATAPRES) 0.1 MG tablet Take 0.1 mg by mouth 2 (two) times daily.    [provider]   famotidine (PEPCID) 20 MG tablet Take 1 tablet (20 mg total) by mouth daily. 10/01/15   Sadie Haber, MD   hydrALAZINE (APRESOLINE) 100 MG tablet Take 1 tablet (100 mg total) by mouth 2 (two) times daily. 05/19/16   Stacey Drain, MD   losartan (COZAAR) 25 MG tablet Take 1 tablet (25 mg total) by mouth daily. 05/19/16   Stacey Drain, MD   simvastatin (ZOCOR) 10 MG tablet Take 10 mg by mouth nightly.    [provider]   SITagliptin (JANUVIA) 25 MG tablet Take 1 tablet (25 mg total) by mouth daily.  05/22/16   Stacie Glaze, NP   vitamin D, ergocalciferol, (DRISDOL) 50000 UNIT Cap Take 2,000 Units by mouth 2 (two) times daily.    [provider]     Vitals   Temp:  [36.6 C (97.8 F)-36.9 C (98.4 F)] 36.7 C (98.1 F)  Heart Rate:  [74-93] 93  Resp Rate:  [16-19] 16  BP: (145-202)/(70-91) 202/91    Wt Readings from Last 3 Encounters:   06/12/16 66.2 kg (146 lb)   06/07/16 65.7 kg (144 lb 12.8 oz)   05/25/16 65.3 kg (144 lb)     BMI (Estimated body mass index is 28.51 kg/m as calculated from the following:    Height as of this encounter: 1.524 m (5').    Weight as of this encounter: 66.2 kg (146 lb).)  Temp Readings from Last 3 Encounters:   06/12/16 36.7 C (98.1 F) (Temporal Artery)   06/08/16 36.3 C (97.3 F)   05/25/16 37.1 C (98.8 F) (Oral)     BP Readings from Last 3  Encounters:   06/12/16 (!) 202/91   06/08/16 129/78   05/25/16 169/65     Pulse Readings from Last 3 Encounters:   06/12/16 93   06/08/16 71   05/25/16 84           Labs:   CBC:  Lab Results   Component Value Date    WBC 7.19 06/12/2016    HGB 9.3 (L) 06/12/2016    HCT 29.2 (L) 06/12/2016    PLT 235 06/12/2016       Chemistries:  Lab Results   Component Value Date    NA 134 (L) 06/12/2016    K 4.0 06/12/2016    CL 108 06/12/2016    CO2 16 (L) 06/12/2016    BUN 18.0 06/12/2016    CREAT 2.0 (H) 06/12/2016    GLU 117 (H) 06/12/2016    HGBA1C 6.3 (H) 05/22/2016    CA 8.3 (L) 06/12/2016    AST 14 06/04/2016       Coags:  Lab Results   Component Value Date    PT 13.6 06/12/2016    PTT 40 (H) 06/05/2016    INR 1.0 06/12/2016     _____________________      Signed by: Valerie Salts  06/12/16   6:58 AM    =============================================================      Signed by: Valerie Salts 06/12/16 6:58 AM

## 2016-06-12 NOTE — OR Nursing (Signed)
0640 - Patient and family seen in pre-op holding area.  Patient verifies, name, date of birth, and surgical procedure being performed today.  H&P has been reviewed and all consents have been signed and are present in patient's chart.  Family acknowledges teachings with no further questions at this time.     0703 - Optifoam dressing applied to lumbosacral area.    0952 - Report given to [facility], [place] - Unable to update family via phone; generic none identifying message left.

## 2016-06-12 NOTE — Progress Notes (Signed)
RESPIRATORY THERAPY NOTE    -Time out pre-procedure pause performed with PA Dukes and RN. Physician/APP gave go ahead. All necessary equipment was available. Patient extubated to 6L NC.       06/12/16 1748   Extubation   Extubation reason CVICU protocol;Ordered - No protocol   Extubated to Nasal cannula   Adverse Reactions None

## 2016-06-12 NOTE — Progress Notes (Signed)
Admit from [place] to CVICU room 204 at 11:30, in bed on monitor with anesthesia. Report given at bedside. Stable VS, keep SBP<140 per PA Dukes. Medications checked, swan and OG verified per xray. Will continue to monitor.

## 2016-06-13 ENCOUNTER — Inpatient Hospital Stay: Payer: Medicare Other

## 2016-06-13 ENCOUNTER — Encounter: Payer: Self-pay | Admitting: Thoracic Surgery (Cardiothoracic Vascular Surgery)

## 2016-06-13 LAB — GLUCOSE WHOLE BLOOD - POCT
Whole Blood Glucose POCT: 101 mg/dL — ABNORMAL HIGH (ref 70–100)
Whole Blood Glucose POCT: 107 mg/dL — ABNORMAL HIGH (ref 70–100)
Whole Blood Glucose POCT: 111 mg/dL — ABNORMAL HIGH (ref 70–100)
Whole Blood Glucose POCT: 114 mg/dL — ABNORMAL HIGH (ref 70–100)
Whole Blood Glucose POCT: 118 mg/dL — ABNORMAL HIGH (ref 70–100)
Whole Blood Glucose POCT: 122 mg/dL — ABNORMAL HIGH (ref 70–100)
Whole Blood Glucose POCT: 123 mg/dL — ABNORMAL HIGH (ref 70–100)
Whole Blood Glucose POCT: 135 mg/dL — ABNORMAL HIGH (ref 70–100)
Whole Blood Glucose POCT: 138 mg/dL — ABNORMAL HIGH (ref 70–100)
Whole Blood Glucose POCT: 144 mg/dL — ABNORMAL HIGH (ref 70–100)
Whole Blood Glucose POCT: 146 mg/dL — ABNORMAL HIGH (ref 70–100)
Whole Blood Glucose POCT: 149 mg/dL — ABNORMAL HIGH (ref 70–100)
Whole Blood Glucose POCT: 149 mg/dL — ABNORMAL HIGH (ref 70–100)
Whole Blood Glucose POCT: 154 mg/dL — ABNORMAL HIGH (ref 70–100)
Whole Blood Glucose POCT: 155 mg/dL — ABNORMAL HIGH (ref 70–100)
Whole Blood Glucose POCT: 161 mg/dL — ABNORMAL HIGH (ref 70–100)

## 2016-06-13 LAB — HEPATIC FUNCTION PANEL
ALT: 10 U/L (ref 0–55)
AST (SGOT): 29 U/L (ref 5–34)
Albumin/Globulin Ratio: 1.5 (ref 0.9–2.2)
Albumin: 3 g/dL — ABNORMAL LOW (ref 3.5–5.0)
Alkaline Phosphatase: 42 U/L (ref 37–106)
Bilirubin Direct: 0.3 mg/dL (ref 0.0–0.5)
Bilirubin Indirect: 0.2 mg/dL (ref 0.0–1.1)
Bilirubin, Total: 0.5 mg/dL (ref 0.2–1.2)
Globulin: 2 g/dL (ref 2.0–3.6)
Protein, Total: 5 g/dL — ABNORMAL LOW (ref 6.0–8.3)

## 2016-06-13 LAB — BLOOD GAS, ARTERIAL
Arterial Total CO2: 20.1 mEq/L — ABNORMAL LOW (ref 24.0–30.0)
Arterial Total CO2: 20.3 mEq/L — ABNORMAL LOW (ref 24.0–30.0)
Arterial Total CO2: 22.6 mEq/L — ABNORMAL LOW (ref 24.0–30.0)
Base Excess, Arterial: -4.1 mEq/L — ABNORMAL LOW (ref <=2.0)
Base Excess, Arterial: -6.7 mEq/L — ABNORMAL LOW (ref <=2.0)
Base Excess, Arterial: -6.7 mEq/L — ABNORMAL LOW (ref <=2.0)
HCO3, Arterial: 18.9 mEq/L — ABNORMAL LOW (ref 23.0–29.0)
HCO3, Arterial: 19 mEq/L — ABNORMAL LOW (ref 23.0–29.0)
HCO3, Arterial: 21.2 mEq/L — ABNORMAL LOW (ref 23.0–29.0)
O2 Sat, Arterial: 97.2 % (ref 95.0–100.0)
O2 Sat, Arterial: 99 % (ref 95.0–100.0)
O2 Sat, Arterial: 99.3 % (ref 95.0–100.0)
Temperature: 36.6
Temperature: 37
Temperature: 37.1
pCO2, Arterial: 40.6 mmhg (ref 35.0–45.0)
pCO2, Arterial: 41.5 mmhg (ref 35.0–45.0)
pCO2, Arterial: 42.2 mmhg (ref 35.0–45.0)
pH, Arterial: 7.284 — ABNORMAL LOW (ref 7.350–7.450)
pH, Arterial: 7.289 — ABNORMAL LOW (ref 7.350–7.450)
pH, Arterial: 7.32 — ABNORMAL LOW (ref 7.350–7.450)
pO2, Arterial: 122 mmhg — ABNORMAL HIGH (ref 80.0–90.0)
pO2, Arterial: 153 mmhg — ABNORMAL HIGH (ref 80.0–90.0)
pO2, Arterial: 92.9 mmhg — ABNORMAL HIGH (ref 80.0–90.0)

## 2016-06-13 LAB — CBC
Absolute NRBC: 0 10*3/uL
Hematocrit: 32.1 % — ABNORMAL LOW (ref 37.0–47.0)
Hgb: 10.3 g/dL — ABNORMAL LOW (ref 12.0–16.0)
MCH: 23.9 pg — ABNORMAL LOW (ref 28.0–32.0)
MCHC: 32.1 g/dL (ref 32.0–36.0)
MCV: 74.5 fL — ABNORMAL LOW (ref 80.0–100.0)
MPV: 10.9 fL (ref 9.4–12.3)
Nucleated RBC: 0 /100 WBC (ref 0.0–1.0)
Platelets: 172 10*3/uL (ref 140–400)
RBC: 4.31 10*6/uL (ref 4.20–5.40)
RDW: 20 % — ABNORMAL HIGH (ref 12–15)
WBC: 13.68 10*3/uL — ABNORMAL HIGH (ref 3.50–10.80)

## 2016-06-13 LAB — BASIC METABOLIC PANEL
BUN: 22 mg/dL — ABNORMAL HIGH (ref 7.0–19.0)
BUN: 31 mg/dL — ABNORMAL HIGH (ref 7.0–19.0)
CO2: 18 mEq/L — ABNORMAL LOW (ref 22–29)
CO2: 17 mEq/L — ABNORMAL LOW (ref 22–29)
Calcium: 8.2 mg/dL — ABNORMAL LOW (ref 8.5–10.5)
Calcium: 8 mg/dL — ABNORMAL LOW (ref 8.5–10.5)
Chloride: 112 mEq/L — ABNORMAL HIGH (ref 100–111)
Chloride: 108 mEq/L (ref 100–111)
Creatinine: 2.3 mg/dL — ABNORMAL HIGH (ref 0.6–1.0)
Creatinine: 2.8 mg/dL — ABNORMAL HIGH (ref 0.6–1.0)
Glucose: 120 mg/dL — ABNORMAL HIGH (ref 70–100)
Glucose: 141 mg/dL — ABNORMAL HIGH (ref 70–100)
Potassium: 5 mEq/L (ref 3.5–5.1)
Potassium: 4.5 mEq/L (ref 3.5–5.1)
Sodium: 139 mEq/L (ref 136–145)
Sodium: 138 mEq/L (ref 136–145)

## 2016-06-13 LAB — GFR
EGFR: 21.4
EGFR: 17

## 2016-06-13 LAB — MAGNESIUM: Magnesium: 3 mg/dL — ABNORMAL HIGH (ref 1.6–2.6)

## 2016-06-13 LAB — PHOSPHORUS: Phosphorus: 4.5 mg/dL (ref 2.3–4.7)

## 2016-06-13 MED ORDER — CARVEDILOL 3.125 MG PO TABS
3.1250 mg | ORAL_TABLET | Freq: Once | ORAL | Status: DC
Start: 2016-06-13 — End: 2016-06-14

## 2016-06-13 MED ORDER — ACETAMINOPHEN 500 MG PO TABS
1000.0000 mg | ORAL_TABLET | Freq: Three times a day (TID) | ORAL | Status: DC | PRN
Start: 2016-06-15 — End: 2016-06-15

## 2016-06-13 MED ORDER — CARVEDILOL 6.25 MG PO TABS
6.2500 mg | ORAL_TABLET | Freq: Two times a day (BID) | ORAL | Status: DC
Start: 2016-06-14 — End: 2016-06-14

## 2016-06-13 MED ORDER — HYDROMORPHONE HCL 2 MG PO TABS
2.0000 mg | ORAL_TABLET | ORAL | Status: DC | PRN
Start: 2016-06-13 — End: 2016-06-15
  Administered 2016-06-13 (×2): 2 mg via ORAL
  Filled 2016-06-13 (×2): qty 1

## 2016-06-13 MED ORDER — CHLOROTHIAZIDE SODIUM 500 MG IV SOLR
500.0000 mg | Freq: Once | INTRAVENOUS | Status: AC
Start: 2016-06-13 — End: 2016-06-13
  Administered 2016-06-13: 500 mg via INTRAVENOUS
  Filled 2016-06-13: qty 500

## 2016-06-13 MED ORDER — CHLOROTHIAZIDE SODIUM 500 MG IV SOLR
500.0000 mg | Freq: Once | INTRAVENOUS | Status: DC
Start: 2016-06-13 — End: 2016-06-13
  Filled 2016-06-13: qty 500

## 2016-06-13 MED ORDER — FUROSEMIDE 10 MG/ML IJ SOLN
60.0000 mg | Freq: Two times a day (BID) | INTRAMUSCULAR | Status: DC
Start: 2016-06-13 — End: 2016-06-14
  Administered 2016-06-13 (×2): 60 mg via INTRAVENOUS
  Filled 2016-06-13 (×2): qty 8

## 2016-06-13 MED ORDER — CARVEDILOL 3.125 MG PO TABS
3.1250 mg | ORAL_TABLET | Freq: Two times a day (BID) | ORAL | Status: DC
Start: 2016-06-13 — End: 2016-06-13
  Administered 2016-06-13: 3.125 mg via ORAL
  Filled 2016-06-13: qty 1

## 2016-06-13 MED ORDER — CARVEDILOL 6.25 MG PO TABS
6.2500 mg | ORAL_TABLET | Freq: Two times a day (BID) | ORAL | Status: DC
Start: 2016-06-13 — End: 2016-06-13

## 2016-06-13 MED ORDER — ACETAMINOPHEN 500 MG PO TABS
1000.0000 mg | ORAL_TABLET | Freq: Three times a day (TID) | ORAL | Status: AC
Start: 2016-06-13 — End: 2016-06-15
  Administered 2016-06-13 – 2016-06-15 (×6): 1000 mg via ORAL
  Filled 2016-06-13 (×6): qty 2

## 2016-06-13 MED ORDER — CLONIDINE HCL 0.1 MG PO TABS
0.1000 mg | ORAL_TABLET | Freq: Two times a day (BID) | ORAL | Status: DC
Start: 2016-06-13 — End: 2016-06-13
  Filled 2016-06-13 (×2): qty 1

## 2016-06-13 MED ORDER — CARVEDILOL 3.125 MG PO TABS
3.1250 mg | ORAL_TABLET | Freq: Once | ORAL | Status: DC
Start: 2016-06-13 — End: 2016-06-13

## 2016-06-13 MED ORDER — FUROSEMIDE 10 MG/ML IJ SOLN
40.0000 mg | Freq: Once | INTRAMUSCULAR | Status: AC
Start: 2016-06-13 — End: 2016-06-13
  Administered 2016-06-13: 40 mg via INTRAVENOUS
  Filled 2016-06-13: qty 4

## 2016-06-13 MED ORDER — SODIUM BICARBONATE 8.4 % IV SOLN
50.0000 meq | Freq: Once | INTRAVENOUS | Status: AC
Start: 2016-06-13 — End: 2016-06-13
  Administered 2016-06-13: 50 meq via INTRAVENOUS

## 2016-06-13 MED ORDER — CLONIDINE HCL 0.1 MG PO TABS
0.1000 mg | ORAL_TABLET | Freq: Two times a day (BID) | ORAL | Status: DC
Start: 2016-06-13 — End: 2016-06-16
  Administered 2016-06-13 – 2016-06-16 (×7): 0.1 mg via ORAL
  Filled 2016-06-13 (×8): qty 1

## 2016-06-13 MED ORDER — SIMVASTATIN 10 MG PO TABS
20.0000 mg | ORAL_TABLET | Freq: Every evening | ORAL | Status: DC
Start: 2016-06-13 — End: 2016-06-19
  Administered 2016-06-13 – 2016-06-18 (×6): 20 mg via ORAL
  Filled 2016-06-13 (×6): qty 2

## 2016-06-13 MED ORDER — HYDRALAZINE HCL 25 MG PO TABS
25.0000 mg | ORAL_TABLET | Freq: Three times a day (TID) | ORAL | Status: DC
Start: 2016-06-13 — End: 2016-06-13
  Administered 2016-06-13 (×2): 25 mg via ORAL
  Filled 2016-06-13 (×2): qty 1

## 2016-06-13 NOTE — Progress Notes (Signed)
[facility]  Progress Note    Assessment:  1. Subacute on CRF; non oliguric (overall rise in serum creatinine from 1/17 -> 9/17); stable  2. [facility]; transfer from [facility]; s/p CABG x4 vessels 10/9  3. Metabolic acidosis; non anion gap; improving  4. Anemia; microcytic/hypochromic    Plan:  1.  Continue NaHCO3 @ 1300mg  enterally TID  2.  Cr up slightly today with reduced UOP. Cr up from 2.0 -->2.3, s/p on-pump CABG yesterday, non oliguric. Minimal intake right now.   3.  Would hold off on further diuretics. Monitor labs.   4.  No acute needs for RRT currently .    D/W family by the bedside and cardiac surgery PA.       Haroldine Laws, MD  Office - [phone]  [employer] [phone]  ++++++++++++++++++++++++++++++++++++++++++++++++++++++++++++++  Subjective:  No new complaints    Medications:  Scheduled Meds:  Current Facility-Administered Medications   Medication Dose Route Frequency   . acetaminophen  1,000 mg Oral Q8H [place]   . amiodarone  200 mg Oral Q12H [place]   . aspirin EC  325 mg Oral Daily   . carvedilol  3.125 mg Oral Q12H [place]   . chlorhexidine  15 mL Mouth/Throat Q12H SCH   . cloNIDine  0.1 mg Oral Q12H SCH   . furosemide  60 mg Intravenous BID   . mupirocin   Nasal Q12H [place]   . senna-docusate  1 tablet Oral QHS   . sodium bicarbonate  50 mEq Intravenous Once   . sodium bicarbonate  1,300 mg Oral TID     Continuous Infusions:  . sodium chloride 20 mL/hr at 06/13/16 1000   . insulin (regular) infusion 0.5 Units/hr (06/13/16 1000)   . niCARdipine Stopped (06/13/16 0839)     PRN Meds:acetaminophen **FOLLOWED BY** [START ON 06/15/2016] acetaminophen, bisacodyl, dextrose, hydrALAZINE, HYDROmorphone, lactated ringers, niCARdipine, ondansetron **OR** ondansetron, traMADol    Objective:  Vital signs in last 24 hours:  Heart Rate:  [61-84] 72  Resp Rate:  [10-18] 14  BP: (114-143)/(59-70) 117/59  Arterial Line BP: (118-158)/(49-64) 126/49  FiO2:  [40 %-100 %] 40  %  Intake/Output last 24 hours:    Intake/Output Summary (Last 24 hours) at 06/13/16 1104  Last data filed at 06/13/16 1000   Gross per 24 hour   Intake          5726.19 ml   Output             1684 ml   Net          4042.19 ml     Intake/Output this shift:  I/O this shift:  In: 164.09 [I.V.:164.09]  Out: 145 [Urine:145]    Physical Exam:  Seen post-op   Gen: extubated, on nasal cannula   CV: S1 S2 N RRR   Chest: poor inspiratory effort   Ab: ND NT soft no HSM +BS   Ext: Trace edema bilateral LEs    Labs:    Recent Labs  Lab 06/13/16  0817 06/13/16  0207 06/12/16  1632 06/12/16  1148 06/12/16  0535 06/11/16  0031   Glucose  --  120*  --  90 117* 103*   BUN  --  22.0*  --  16.0 18.0 22.0*   Creatinine  --  2.3*  --  1.9* 2.0* 2.1*   Calcium  --  8.2*  --  8.1* 8.3* 8.0*   Sodium  --  139  --  136 134* 130*   Potassium  --  5.0 4.6 5.0 4.0 4.0   Chloride  --  112*  --  112* 108 105   CO2  --  18*  --  18* 16* 16*   Albumin 3.0*  --   --   --   --   --    Phosphorus  --  4.5  --   --  3.5 3.9   Magnesium  --  3.0* 2.9* 3.2* 2.4 1.5*       Recent Labs  Lab 06/13/16  0207 06/12/16  1148 06/12/16  0535   WBC 13.68* 5.99 7.19   Hgb 10.3* 9.0* 9.3*   Hematocrit 32.1* 28.0* 29.2*   MCV 74.5* 73.5* 68.2*   MCH 23.9* 23.6* 21.7*   MCHC 32.1 32.1 31.8*   RDW 20* 21* 18*   MPV 10.9 10.9 10.4   Platelets 172 129* 235

## 2016-06-13 NOTE — Plan of Care (Signed)
Problem: Post-op Phase - Cardiac Surgery  Goal: Cardiac output is adequate  Outcome: Progressing   06/13/16 0714   Goal/Interventions addressed this shift   Cardiac output is adequate  Monitor/assess cardiac output/index per LIP order;Monitor/assess vital signs, hemodynamic parameters, and temperature per LIP orders;Keep pacer wires connected to pacemaker for at least 8-12 hours. When rhythm is stable without pacing, ground epicardial wires.   Patient maintained stable this shift with adequate cardiac index/ cardiac output. See flowsheets for trends. Patient was given lasix x1 for low urine output with little response. Patient was also given bicarb for metabolic acidosis. Family updated on plan of care. Patient dangled and stood without issues. All safety measures in place.

## 2016-06-13 NOTE — Progress Notes (Addendum)
[facility] ICU PROGRESS NOTE      POD: 2    Surgery:  Procedure(s):  Coronary Artery Bypass x 4.  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  Coreg and resume home Clonidine. Resume home  Norvasc and Cozaar as needed.    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 May need endo consult   HgbA1c: 6.3    Plan:   Monitor creatinine   Apprectiate nephrology input      Subjective:   Interval History: No issues overnight. Denies any specific complaint. Weak, tired and pain are mentioned.       Objective:   Vital signs for last 24 hours:  Temp:  [97.8 F (36.6 C)-98 F (36.7 C)] 97.9 F (36.6 C)  Heart Rate:  [71-84] 79  Resp Rate:  [12-20] 20  BP: (117-173)/(59-84) 163/75  Arterial Line BP: (126-185)/(49-69) 185/67    Neuro: Intact, moves all  Cardiac: RRR no murmur or rub  Lungs: Decreased bilat, but clear  Abdomen: soft NT ND BS hypoactive  Extremities: warm pulse intact  1+ edema LE  Wounds: C/D/I      Invasive ICU Hemodynamics:  PAP: (26-346)/(12-346) 334/334  CVP:  [11 mmHg-347 mmHg] 338 mmHg  CO:  [4.3 L/min-4.9 L/min] 4.3 L/min  CI:  [2.6 L/min/m2-3 L/min/m2] 2.6 L/min/m2    Vent Settings:         Medications:   Scheduled Meds:  Current Facility-Administered Medications   Medication Dose Route Frequency   . acetaminophen  1,000 mg Oral Q8H [place]   . amiodarone  200 mg Oral Q12H [place]   . aspirin EC  325 mg Oral Daily   . carvedilol  3.125 mg Oral Once   . [START ON 06/14/2016] carvedilol  6.25 mg Oral Q12H [place]   . chlorhexidine  15 mL Mouth/Throat Q12H SCH   . cloNIDine  0.1 mg Oral Q12H SCH   . furosemide  60 mg Intravenous BID   . mupirocin   Nasal Q12H [place]   . senna-docusate  1 tablet Oral QHS   . simvastatin  20 mg Oral QHS   . sodium bicarbonate  50 mEq Intravenous Once   . sodium bicarbonate  1,300 mg Oral TID     Continuous Infusions:  . sodium chloride 20 mL/hr at 06/13/16 2000   . insulin (regular) infusion 0.6 Units/hr (06/13/16 2007)         Labs:   CBC:   Recent Labs      06/13/16   0207   WBC  13.68*   Hgb  10.3*   Hematocrit  32.1*   Platelets  172     BMP:   Recent Labs      06/13/16   0207   Sodium  139   Potassium  5.0   Chloride  112*   CO2  18*   BUN  22.0*   Creatinine  2.3*   Glucose  120*   Magnesium  3.0*   Calcium  8.2*   Phosphorus  4.5     LFTs:   Recent Labs      06/13/16   0817   AST (SGOT)  29   ALT  10   Alkaline Phosphatase  42   Bilirubin, Total  0.5   Bilirubin, Direct  0.3   Bilirubin, Indirect  0.2   Protein, Total  5.0*   Albumin  3.0*     INR:   Recent Labs      06/12/16   1148   PT  15.6*   PT INR  1.2*     ABG:   Recent Labs      06/13/16   0817   pH, Arterial  7.284*   pO2, Arterial  92.9*   pCO2, Arterial  41.5   HCO3, Arterial  19.0*   Base Excess, Arterial  -6.7*         Microbiology (past 7 days):         Radiology (past 7 days):   Chest xray: see above        Christain Sacramento, PA-C  Cardiovascular Surgery    Patient seen and agree with  above assessment and plan.  Sydell Axon MD

## 2016-06-13 NOTE — Progress Notes (Signed)
Pt. Laotian speaking,daughter at bedside who translates. AOX4,FC,MAE,OOB to chair.VSS,Afebrile,Cardene stopped @0840 . Adequate pain control with prns.On 2L NC,tried to wean but desats upto 86% on RA.Encouraged to use IS.BS+,no BM.AUOP via foley. Chest tubes pulled out by PA Adam. Daughter at bedside and updated. Refer to flowsheets for further details.

## 2016-06-13 NOTE — Plan of Care (Signed)
Problem: Safety  Goal: Patient will be free from injury during hospitalization  Outcome: Progressing   06/13/16 1138   Goal/Interventions addressed this shift   Patient will be free from injury during hospitalization  Assess patient's risk for falls and implement fall prevention plan of care per policy;Provide and maintain safe environment;Use appropriate transfer methods;Ensure appropriate safety devices are available at the bedside;Hourly rounding;Include patient/ family/ care giver in decisions related to safety;Assess for patients risk for elopement and implement Elopement Risk Plan per policy     Goal: Patient will be free from infection during hospitalization  Outcome: Progressing   06/12/16 2301   Goal/Interventions addressed this shift   Free from Infection during hospitalization Assess and monitor for signs and symptoms of infection;Monitor lab/diagnostic results       Problem: Pain  Goal: Pain at adequate level as identified by patient  Outcome: Progressing   06/13/16 1138   Goal/Interventions addressed this shift   Pain at adequate level as identified by patient Identify patient comfort function goal;Assess for risk of opioid induced respiratory depression, including snoring/sleep apnea. Alert healthcare team of risk factors identified.;Reassess pain within 30-60 minutes of any procedure/intervention, per Pain Assessment, Intervention, Reassessment (AIR) Cycle;Assess pain on admission, during daily assessment and/or before any "as needed" intervention(s);Evaluate if patient comfort function goal is met;Evaluate patient's satisfaction with pain management progress       Problem: Discharge Barriers  Goal: Patient will be discharged home or other facility with appropriate resources  Outcome: Progressing   06/13/16 1138   Goal/Interventions addressed this shift   Discharge to home or other facility with appropriate resources Provide appropriate patient education;Initiate discharge planning;Provide information  on available health resources       Problem: Hemodynamic Status: Cardiac  Goal: Stable vital signs and fluid balance  Outcome: Progressing   06/13/16 1138   Goal/Interventions addressed this shift   Stable vital signs and fluid balance Monitor/assess vital signs and telemetry per unit protocol;Assess signs and symptoms associated with cardiac rhythm changes;Weigh on admission and record weight daily;Monitor intake/output per unit protocol and/or LIP order;Monitor lab values;Monitor for leg swelling/edema and report to LIP if abnormal       Problem: Moderate/High Fall Risk Score >5  Goal: Patient will remain free of falls  Outcome: Progressing   06/13/16 0800   OTHER   Moderate Risk (6-13) MOD-(VH Only) Yellow "Fall Risk" signage;MOD-(VH Only) Yellow slippers;MOD-Consider activation of bed alarm if appropriate;MOD-Apply bed exit alarm if patient is confused;MOD-Floor mat at bedside (where available) if appropriate;MOD-Remain with patient during toileting;MOD-Re-orient confused patients       Problem: Renal Instability  Goal: Fluid and electrolyte balance are achieved/maintained  Outcome: Progressing   06/13/16 1138   Goal/Interventions addressed this shift   Fluid and electrolyte balance are achieved/maintained  Monitor intake and output every shift;Monitor/assess lab values and report abnormal values;Provide adequate hydration;Monitor daily weight;Assess for confusion/personality changes;Assess and reassess fluid and electrolyte status;Monitor for muscle weakness;Observe for seizure activity and initiate seizure precautions if indicated;Follow fluid restrictions/IV/PO parameters     Goal: Free from infection  Outcome: Progressing   06/13/16 1138   Goal/Interventions addressed this shift   Free from infection Monitor/assess for signs and symptoms of infection;Assess need for indwelling catheter every shift and discuss with LIP       Problem: Post-op Phase - Cardiac Surgery  Goal: Effective breathing pattern is  maintained   06/13/16 1138   Goal/Interventions addressed this shift   Effective breathing pattern is maintained  Position patient for maximum ventilatory efficiency with HOB to a minimum of 30 degrees when hemodynamically stable;Maintain SpO2 level per LIP order;Maintain CO2 level per LIP order;Monitor end-tidal CO2 level per LIP order;Reinforce use of ordered respiratory interventions (i.e. CPAP, BiPAP, Incentive Spirometer, Acapella);Monitor for sleep apnea;Monitor for medication-induced respiratory depression     Goal: Cardiac output is adequate  Outcome: Completed Date Met: 06/13/16    Goal: Patient will remain free from post-op complications  Outcome: Progressing   06/13/16 1138   Goal/Interventions addressed this shift   Patient will remain free from post-op complications  Maintain sternal precautions as described in patient education;Maintain pericare/Foley care per protocol;VTE prevention: Administer anticoagulants and/or apply anti-embolism stockings/devices as ordered;Monitor/assess blood glucose per LIP orders;Assess dressing and reinforce or change per LIP order;Assess surgical incision/wound site and treat per LIP order     Goal: Mobility/activity is maintained at optimal level  Outcome: Progressing   06/13/16 1138   Goal/Interventions addressed this shift   Mobility/activity is maintained at optimal level  Increase mobility as tolerated/progressive mobility;Get patient OOB to chair after 4-8 hours of extubation;Get patient OOB to chair for meals;Encourage independent activity per ability. Reinforce sternal precautions.;Plan activities to conserve energy. Plan rest periods.     Goal: Nutritional intake is adequate  Outcome: Progressing   06/13/16 1138   Goal/Interventions addressed this shift   Nutritional intake is adequate Monitor daily weights;Assist patient with meals/food selection;Encourage/perform oral hygiene as appropriate;Allow adequate time for meals;Assess anorexia, appetite, and amount of  meal/food tolerated       Problem: Compromised Tissue integrity  Goal: Damaged tissue is healing and protected  Outcome: Progressing   06/13/16 1138   Goal/Interventions addressed this shift   Damaged tissue is healing and protected  Monitor/assess Braden scale every shift;Provide wound care per wound care algorithm;Reposition patient every 2 hours and as needed unless able to reposition self;Increase activity as tolerated/progressive mobility;Relieve pressure to bony prominences for patients at moderate and high risk;Keep intact skin clean and dry;Use bath wipes, not soap and water, for daily bathing;Avoid shearing injuries;Monitor external devices/tubes for correct placement to prevent pressure, friction and shearing;Monitor patient's hygiene practices     Goal: Nutritional status is improving  Outcome: Progressing   06/13/16 1138   Goal/Interventions addressed this shift   Nutritional status is improving Assist patient with eating;Encourage patient to take dietary supplement(s) as ordered;Allow adequate time for meals;Include patient/patient care companion in decisions related to nutrition

## 2016-06-13 NOTE — Plan of Care (Signed)
[place]    S/p CABG x 4. Noted plans in chart to transfer to [facility]. Spoke to her daughter at bedside. Will arrange 2 week discharge follow up at our [facility] office. Gave her my card.     Vella Raring NP

## 2016-06-13 NOTE — Anesthesia Postprocedure Evaluation (Signed)
Anesthesia Post Evaluation    Patient: Jacqueline Weber    Procedure(s) with comments:  Coronary Artery Bypass x 4. - LIMA to LDA  Vein to PDA  Vein to OM  Vein to Diagonal  Endoscopic, Left Leg Greater Saphenous Vein Harvest - Left Leg Incision (medially, groin to ankle) @ 0742  Vein out of leg @ 0826  Vein prep complete @ 0845  Total time = 63 minutes    Anesthesia type: general    Last Vitals:   Vitals:    06/13/16 0500   BP: 120/63   Pulse: 73   Resp: 15   Temp:    SpO2: 100%       Patient Location: ICU      Post Pain: Patient not complaining of pain, continue current therapy    Mental Status: awake and alert    Respiratory Function: tolerating nasal cannula    Cardiovascular: stable    Nausea/Vomiting: patient not complaining of nausea or vomiting    Hydration Status: adequate    Post Assessment: no apparent anesthetic complications, no reportable events and no evidence of recall          Anesthesia Qualified Clinical Data Registry    Central Line      CVC insertion : YES              > Maximal sterile barrier technique (cap/mask/sterile gown/sterile gloves/sterile sheet/hand hygiene/2% chlorhedinine or acceptable antiseptic alternative) : YES              > Pneumothorax requiring thoracostomy secondary to CVC : NO                > Arterial injury secondary to CVC placement : NO    Perioperative temperature management      General/neuraxial anesthesia > or = 60 minutes (excluding CABG) : NO                                Administration of antibiotic prophylaxis      Age > or = 18, with IV access, with surgical procedure for which antibiotic prophylaxis indicated, and not on chronic antibiotics : YES              > Prophylactic antibiotics within 1 hour of incision (or fluroroquinolone/vancomycin within 2 hours of incision) : YES    Medication Administration      Ordering or administration of drug inconsistent with intended drug, dose, delivery or timing : NO      Dental loss/damage      Dental injury with  administration of anesthesia : NO      Difficult intubation due to unrecognized difficult airway        Elective airway procedure including but not limited to: tracheostomy, fiberoptic bronchoscopy, rigid bronchoscopy; jet ventilation; or elective use of a device to facilitate airway management such as a Glidescope : NO                > Unanticipated difficult intubation post pre-evaluation : NO      Aspiration of gastric contents        Aspiration of gastric contents : NO                    Surgical fire        Procedure requiring electrocautery/laser : YES                >  Ignition/burning in invasive procedure location : NO      Immediate perioperative cardiac arrest        Cardiac arrest in OR or PACU : NO                    Unplanned hospital admission        Unplanned hospital admission for initially intended outpatient anesthesia service : NO      Unplanned ICU admission        Unplanned ICU admission related to anesthesia occurring within 24 hours of induction or start of MAC : NO      Surgical case cancellation        Cancellation of procedure after care already started by anesthesia care team : NO      Post-anesthesia transfer of care checklist/protocol to PACU        Transfer from OR to PACU upon case conclusion : NO                    Post-anesthesia transfer of care checklist/protocol to ICU        Transfer from OR to ICU upon case conclusion : YES                    Post-operative nausea/vomiting risk protocol        Post-operative nausea/vomiting risk protocol : YES  Patient > or = 18 with care initiated by anesthesia team that has a risk factor screen for post-op nausea/vomiting (Includes female, hx PONV, or motion sickness, non-smoker, intended opioid administration for post-op analgesia.)    Anaphylaxis        Anaphylaxis during anesthesia services : NO    (Inclusive of any suspected transfusion reaction in association with blood-bank confirmed blood product incompatibility)              Eilleen Kempf, 06/13/2016 5:50 AM

## 2016-06-14 ENCOUNTER — Inpatient Hospital Stay: Payer: Medicare Other

## 2016-06-14 DIAGNOSIS — Z951 Presence of aortocoronary bypass graft: Secondary | ICD-10-CM | POA: Insufficient documentation

## 2016-06-14 LAB — CBC
Absolute NRBC: 0.02 10*3/uL — ABNORMAL HIGH
Hematocrit: 28.5 % — ABNORMAL LOW (ref 37.0–47.0)
Hgb: 9.1 g/dL — ABNORMAL LOW (ref 12.0–16.0)
MCH: 23.7 pg — ABNORMAL LOW (ref 28.0–32.0)
MCHC: 31.9 g/dL — ABNORMAL LOW (ref 32.0–36.0)
MCV: 74.2 fL — ABNORMAL LOW (ref 80.0–100.0)
MPV: 11.1 fL (ref 9.4–12.3)
Nucleated RBC: 0.1 /100 WBC (ref 0.0–1.0)
Platelets: 156 10*3/uL (ref 140–400)
RBC: 3.84 10*6/uL — ABNORMAL LOW (ref 4.20–5.40)
RDW: 21 % — ABNORMAL HIGH (ref 12–15)
WBC: 13.56 10*3/uL — ABNORMAL HIGH (ref 3.50–10.80)

## 2016-06-14 LAB — HEMOLYSIS INDEX: Hemolysis Index: 12 (ref 0–18)

## 2016-06-14 LAB — UREA NITROGEN, URINE: Urine Urea Nitrogen Random: 227 mg/dL

## 2016-06-14 LAB — BASIC METABOLIC PANEL
BUN: 34 mg/dL — ABNORMAL HIGH (ref 7.0–19.0)
CO2: 17 mEq/L — ABNORMAL LOW (ref 22–29)
Calcium: 8.1 mg/dL — ABNORMAL LOW (ref 8.5–10.5)
Chloride: 107 mEq/L (ref 100–111)
Creatinine: 3 mg/dL — ABNORMAL HIGH (ref 0.6–1.0)
Glucose: 142 mg/dL — ABNORMAL HIGH (ref 70–100)
Potassium: 4.7 mEq/L (ref 3.5–5.1)
Sodium: 138 mEq/L (ref 136–145)

## 2016-06-14 LAB — HEPATITIS B SURFACE ANTIGEN W/ REFLEX TO CONFIRMATION: Hepatitis B Surface Antigen: NONREACTIVE

## 2016-06-14 LAB — GLUCOSE WHOLE BLOOD - POCT
Whole Blood Glucose POCT: 141 mg/dL — ABNORMAL HIGH (ref 70–100)
Whole Blood Glucose POCT: 152 mg/dL — ABNORMAL HIGH (ref 70–100)
Whole Blood Glucose POCT: 143 mg/dL — ABNORMAL HIGH (ref 70–100)
Whole Blood Glucose POCT: 145 mg/dL — ABNORMAL HIGH (ref 70–100)
Whole Blood Glucose POCT: 150 mg/dL — ABNORMAL HIGH (ref 70–100)
Whole Blood Glucose POCT: 191 mg/dL — ABNORMAL HIGH (ref 70–100)
Whole Blood Glucose POCT: 188 mg/dL — ABNORMAL HIGH (ref 70–100)

## 2016-06-14 LAB — CREATININE, URINE, RANDOM: Urine Creatinine, Random: 55 mg/dL

## 2016-06-14 LAB — URINALYSIS, REFLEX TO MICROSCOPIC EXAM IF INDICATED
Bilirubin, UA: NEGATIVE
Glucose, UA: 50 — AB
Ketones UA: NEGATIVE
Leukocyte Esterase, UA: NEGATIVE
Nitrite, UA: NEGATIVE
Protein, UR: >300 — AB
Specific Gravity UA: 1.013 (ref 1.001–1.035)
Urine pH: 5 (ref 5.0–8.0)
Urobilinogen, UA: NORMAL mg/dL

## 2016-06-14 LAB — MAGNESIUM
Magnesium: 2.7 mg/dL — ABNORMAL HIGH (ref 1.6–2.6)
Magnesium: 2.7 mg/dL — ABNORMAL HIGH (ref 1.6–2.6)

## 2016-06-14 LAB — POTASSIUM: Potassium: 4.7 mEq/L (ref 3.5–5.1)

## 2016-06-14 LAB — HEPATITIS B SURFACE ANTIBODY: HEPATITIS B SURFACE ANTIBODY: 586.82

## 2016-06-14 LAB — GFR: EGFR: 15.7

## 2016-06-14 LAB — PHOSPHORUS: Phosphorus: 7.3 mg/dL — ABNORMAL HIGH (ref 2.3–4.7)

## 2016-06-14 MED ORDER — HYDRALAZINE HCL 25 MG PO TABS
50.0000 mg | ORAL_TABLET | Freq: Three times a day (TID) | ORAL | Status: DC
Start: 2016-06-14 — End: 2016-06-14
  Administered 2016-06-14: 50 mg via ORAL
  Filled 2016-06-14: qty 2

## 2016-06-14 MED ORDER — SENNOSIDES-DOCUSATE SODIUM 8.6-50 MG PO TABS
2.0000 | ORAL_TABLET | Freq: Two times a day (BID) | ORAL | Status: DC
Start: 2016-06-14 — End: 2016-06-15
  Administered 2016-06-14 – 2016-06-15 (×2): 2 via ORAL
  Filled 2016-06-14 (×4): qty 1

## 2016-06-14 MED ORDER — SODIUM CHLORIDE 0.9 % IV SOLN
5.0000 mg/h | INTRAVENOUS | Status: DC
Start: 2016-06-14 — End: 2016-06-15
  Administered 2016-06-14: 5 mg/h via INTRAVENOUS
  Administered 2016-06-15: 2.5 mg/h via INTRAVENOUS
  Filled 2016-06-14 (×3): qty 10

## 2016-06-14 MED ORDER — AMLODIPINE BESYLATE 5 MG PO TABS
5.0000 mg | ORAL_TABLET | Freq: Two times a day (BID) | ORAL | Status: DC
Start: 2016-06-14 — End: 2016-06-14

## 2016-06-14 MED ORDER — GLUCOSE 40 % PO GEL
15.0000 g | ORAL | Status: DC | PRN
Start: 2016-06-14 — End: 2016-06-15

## 2016-06-14 MED ORDER — FAMOTIDINE 20 MG PO TABS
20.0000 mg | ORAL_TABLET | Freq: Every day | ORAL | Status: DC
Start: 2016-06-14 — End: 2016-06-19
  Administered 2016-06-14 – 2016-06-19 (×6): 20 mg via ORAL
  Filled 2016-06-14 (×6): qty 1

## 2016-06-14 MED ORDER — HYDRALAZINE HCL 25 MG PO TABS
100.0000 mg | ORAL_TABLET | Freq: Three times a day (TID) | ORAL | Status: DC
Start: 2016-06-15 — End: 2016-06-19
  Administered 2016-06-15 – 2016-06-19 (×13): 100 mg via ORAL
  Filled 2016-06-14 (×13): qty 4

## 2016-06-14 MED ORDER — HYDRALAZINE HCL 25 MG PO TABS
25.0000 mg | ORAL_TABLET | Freq: Three times a day (TID) | ORAL | Status: DC
Start: 2016-06-14 — End: 2016-06-14
  Administered 2016-06-14: 25 mg via ORAL
  Filled 2016-06-14: qty 1

## 2016-06-14 MED ORDER — CARVEDILOL 3.125 MG PO TABS
3.1250 mg | ORAL_TABLET | Freq: Two times a day (BID) | ORAL | Status: DC
Start: 2016-06-14 — End: 2016-06-14
  Administered 2016-06-14: 3.125 mg via ORAL
  Filled 2016-06-14: qty 1

## 2016-06-14 MED ORDER — DEXTROSE 10 % IV BOLUS
125.0000 mL | INTRAVENOUS | Status: DC | PRN
Start: 2016-06-14 — End: 2016-06-15

## 2016-06-14 MED ORDER — INSULIN GLARGINE 100 UNIT/ML SC SOLN
10.0000 [IU] | Freq: Every morning | SUBCUTANEOUS | Status: DC
Start: 2016-06-14 — End: 2016-06-15
  Administered 2016-06-14 – 2016-06-15 (×2): 10 [IU] via SUBCUTANEOUS
  Filled 2016-06-14 (×2): qty 10

## 2016-06-14 MED ORDER — INSULIN LISPRO 100 UNIT/ML SC SOLN
1.0000 [IU] | Freq: Three times a day (TID) | SUBCUTANEOUS | Status: DC | PRN
Start: 2016-06-14 — End: 2016-06-15
  Administered 2016-06-14 (×2): 1 [IU] via SUBCUTANEOUS
  Filled 2016-06-14 (×2): qty 3

## 2016-06-14 MED ORDER — AMIODARONE HCL IN DEXTROSE 150-4.21 MG/100ML-% IV SOLN
150.0000 mg | Freq: Once | INTRAVENOUS | Status: AC
Start: 2016-06-14 — End: 2016-06-14
  Administered 2016-06-14: 150 mg via INTRAVENOUS
  Filled 2016-06-14: qty 100

## 2016-06-14 MED ORDER — SODIUM CHLORIDE 0.9 % IV SOLN
10.0000 mg/h | INTRAVENOUS | Status: DC
Start: 2016-06-14 — End: 2016-06-15
  Administered 2016-06-14: 5 mg/h via INTRAVENOUS
  Administered 2016-06-14 – 2016-06-15 (×2): 10 mg/h via INTRAVENOUS
  Filled 2016-06-14 (×7): qty 10

## 2016-06-14 MED ORDER — AMLODIPINE BESYLATE 5 MG PO TABS
5.0000 mg | ORAL_TABLET | Freq: Every day | ORAL | Status: DC
Start: 2016-06-14 — End: 2016-06-14

## 2016-06-14 MED ORDER — HYDRALAZINE HCL 20 MG/ML IJ SOLN
20.0000 mg | Freq: Once | INTRAMUSCULAR | Status: AC
Start: 2016-06-14 — End: 2016-06-14
  Administered 2016-06-14: 20 mg via INTRAVENOUS
  Filled 2016-06-14: qty 1

## 2016-06-14 MED ORDER — SIMETHICONE 80 MG PO CHEW
80.0000 mg | CHEWABLE_TABLET | Freq: Four times a day (QID) | ORAL | Status: DC | PRN
Start: 2016-06-14 — End: 2016-06-15

## 2016-06-14 MED ORDER — DARBEPOETIN ALFA 60 MCG/0.3ML IJ SOSY
60.0000 ug | PREFILLED_SYRINGE | INTRAMUSCULAR | Status: DC
Start: 2016-06-14 — End: 2016-06-19
  Administered 2016-06-14: 60 ug via SUBCUTANEOUS
  Filled 2016-06-14: qty 0.3

## 2016-06-14 MED ORDER — CARVEDILOL 3.125 MG PO TABS
3.1250 mg | ORAL_TABLET | Freq: Once | ORAL | Status: AC
Start: 2016-06-14 — End: 2016-06-14
  Administered 2016-06-14: 3.125 mg via ORAL
  Filled 2016-06-14: qty 1

## 2016-06-14 MED ORDER — INSULIN LISPRO 100 UNIT/ML SC SOLN
1.0000 [IU] | Freq: Every evening | SUBCUTANEOUS | Status: DC | PRN
Start: 2016-06-14 — End: 2016-06-15

## 2016-06-14 MED ORDER — HEPARIN SODIUM (PORCINE) 5000 UNIT/ML IJ SOLN
5000.0000 [IU] | Freq: Two times a day (BID) | INTRAMUSCULAR | Status: DC
Start: 2016-06-14 — End: 2016-06-19
  Administered 2016-06-14 – 2016-06-19 (×10): 5000 [IU] via SUBCUTANEOUS
  Filled 2016-06-14 (×11): qty 1

## 2016-06-14 MED ORDER — SODIUM BICARBONATE 650 MG PO TABS
650.0000 mg | ORAL_TABLET | Freq: Two times a day (BID) | ORAL | Status: DC
Start: 2016-06-14 — End: 2016-06-15
  Administered 2016-06-14: 650 mg via ORAL
  Filled 2016-06-14: qty 1

## 2016-06-14 MED ORDER — CARVEDILOL 6.25 MG PO TABS
12.5000 mg | ORAL_TABLET | Freq: Two times a day (BID) | ORAL | Status: DC
Start: 2016-06-15 — End: 2016-06-15

## 2016-06-14 MED ORDER — GLUCAGON 1 MG IJ SOLR (WRAP)
1.0000 mg | INTRAMUSCULAR | Status: DC | PRN
Start: 2016-06-14 — End: 2016-06-15

## 2016-06-14 MED ORDER — AMLODIPINE BESYLATE 5 MG PO TABS
5.0000 mg | ORAL_TABLET | Freq: Every day | ORAL | Status: DC
Start: 2016-06-14 — End: 2016-06-14
  Administered 2016-06-14: 5 mg via ORAL
  Filled 2016-06-14: qty 1

## 2016-06-14 MED ORDER — SODIUM BICARBONATE 650 MG PO TABS
650.0000 mg | ORAL_TABLET | Freq: Two times a day (BID) | ORAL | Status: DC
Start: 2016-06-14 — End: 2016-06-14

## 2016-06-14 MED ORDER — HYDRALAZINE HCL 25 MG PO TABS
25.0000 mg | ORAL_TABLET | Freq: Three times a day (TID) | ORAL | Status: DC
Start: 2016-06-14 — End: 2016-06-14

## 2016-06-14 MED ORDER — CARVEDILOL 6.25 MG PO TABS
6.2500 mg | ORAL_TABLET | Freq: Two times a day (BID) | ORAL | Status: DC
Start: 2016-06-14 — End: 2016-06-14
  Administered 2016-06-14: 6.25 mg via ORAL
  Filled 2016-06-14: qty 1

## 2016-06-14 MED ORDER — POLYETHYLENE GLYCOL 3350 17 G PO PACK
17.0000 g | PACK | Freq: Every day | ORAL | Status: DC
Start: 2016-06-14 — End: 2016-06-15
  Administered 2016-06-14 – 2016-06-15 (×2): 17 g via ORAL
  Filled 2016-06-14: qty 1

## 2016-06-14 MED ORDER — FUROSEMIDE 10 MG/ML IJ SOLN
80.0000 mg | Freq: Once | INTRAMUSCULAR | Status: AC
Start: 2016-06-14 — End: 2016-06-14
  Administered 2016-06-14: 80 mg via INTRAVENOUS
  Filled 2016-06-14: qty 8

## 2016-06-14 MED ORDER — AMLODIPINE BESYLATE 5 MG PO TABS
10.0000 mg | ORAL_TABLET | Freq: Every day | ORAL | Status: DC
Start: 2016-06-15 — End: 2016-06-19
  Administered 2016-06-15 – 2016-06-19 (×5): 10 mg via ORAL
  Filled 2016-06-14 (×4): qty 2

## 2016-06-14 MED ORDER — AMLODIPINE BESYLATE 5 MG PO TABS
5.0000 mg | ORAL_TABLET | Freq: Once | ORAL | Status: AC
Start: 2016-06-14 — End: 2016-06-14
  Administered 2016-06-14: 5 mg via ORAL
  Filled 2016-06-14: qty 1

## 2016-06-14 NOTE — Progress Notes (Signed)
[facility].                                                                Progress Note                                                               Impression:    CABG x 4 on 10/9   AKI, non-oliguric ATN, Cr 1.9-->3; UOP 1520ml   CKD-4, recent baseline Cr 1.9 to 2.6   HTN uncontrolled   Volume overload   Metabolic acidosis, non-AG   Anemia CKD   Hyper-phosphatemia    Recommendations:    Resume hydralazine and adjust as needed   Increase amlodipine   Continue lasix drip   Probably will need HD next 1-2 days   If HD is started, will resume ARB   Start ESA and re-check iron stores   Reduce Na bicarbonate    D/w pt via friend translating    Labs:     Recent Labs  Lab 06/14/16  0108 06/13/16  2059 06/13/16  0207 06/12/16  1632 06/12/16  1148 06/12/16  0535 06/11/16  0031 06/10/16  0527   Sodium 138 138 139  --  136 134* 130* 127*   Potassium 4.7 4.5 5.0 4.6 5.0 4.0 4.0 4.0   Chloride 107 108 112*  --  112* 108 105 103   CO2 17* 17* 18*  --  18* 16* 16* 14*   BUN 34.0* 31.0* 22.0*  --  16.0 18.0 22.0* 23.0*   Creatinine 3.0* 2.8* 2.3*  --  1.9* 2.0* 2.1* 2.4*   Glucose 142* 141* 120*  --  90 117* 103* 89   Calcium 8.1* 8.0* 8.2*  --  8.1* 8.3* 8.0* 8.1*   Magnesium 2.7*  --  3.0* 2.9* 3.2* 2.4 1.5* 1.6   Phosphorus 7.3*  --  4.5  --   --  3.5 3.9 4.3   EGFR 15.7 17.0 21.4  --  26.6 25.1 23.7 20.3       Recent Labs  Lab 06/14/16  0108 06/13/16  0207 06/12/16  1148   WBC 13.56* 13.68* 5.99   Hgb 9.1* 10.3* 9.0*   Hematocrit 28.5* 32.1* 28.0*   Platelets 156 172 129*         Recent Labs  Lab 06/10/16  0527   Ferritin 154.39       Recent Labs  Lab 06/10/16  0527   Iron 41   TIBC 185*       Recent Labs  Lab 06/14/16  0847   Color, UA Yellow   Specific Gravity UA 1.013   Urine pH 5.0   Leukocyte Esterase, UA Negative   Nitrite, UA Negative   Protein, UR >300*   Glucose, UA 50*   Ketones UA Negative   Urobilinogen, UA Normal   Bilirubin, UA Negative   Blood, UA Small*    RBC, UA 0 - 2   WBC, UA 0 - 5         Medications:  Scheduled Meds:       acetaminophen 1,000 mg Oral Q8H [place]   amiodarone 200 mg Oral Q12H [facility]   aspirin EC 325 mg Oral Daily   carvedilol 6.25 mg Oral Q12H [facility]   chlorhexidine 15 mL Mouth/Throat Q12H [facility]   cloNIDine 0.1 mg Oral Q12H [place]   famotidine 20 mg Oral Daily   insulin glargine 10 Units Subcutaneous QAM   mupirocin  Nasal Q12H [facility]   senna-docusate 1 tablet Oral QHS   simvastatin 20 mg Oral QHS   sodium bicarbonate 1,300 mg Oral TID         Continuous Infusions:  . sodium chloride 20 mL/hr at 06/14/16 1400   . furosemide (LASIX) infusion 10 mg/hr (06/14/16 1400)         PRN Meds:acetaminophen **FOLLOWED BY** [START ON 06/15/2016] acetaminophen, bisacodyl, Nursing communication: Adult Hypoglycemia Treatment Algorithm **AND** dextrose **AND** dextrose **AND** glucagon (rDNA) **AND** Nursing communication: Document Significant Event Note, HYDROmorphone, insulin lispro, insulin lispro, lactated ringers, ondansetron **OR** ondansetron, traMADol    Subjective:   No new complaints.    No SOB  No Chest pain  No Abdominal pain  No Nausea/Vomiting, diarrhea    Objective:   Vital signs in last 24 hours:  Temp:  [97.7 F (36.5 C)-98 F (36.7 C)] 97.8 F (36.6 C)  Heart Rate:  [66-86] 72  Resp Rate:  [12-30] 18  BP: (134-186)/(64-112) 177/112  Arterial Line BP: (142-204)/(58-97) 188/78    Intake/Output:   Intake/Output last 24 hours:    Intake/Output Summary (Last 24 hours) at 06/14/16 1442  Last data filed at 06/14/16 1400   Gross per 24 hour   Intake           566.34 ml   Output             1505 ml   Net          -938.66 ml     Intake/Output this shift:  I/O this shift:  In: 231.5 [I.V.:231.5]  Out: 510 [Urine:510]     General:    In NAD          CVS:  RRR, no rub        Chest:  CTA/Bilaterally  Abdomen:   Soft, nontender           Ext.:   No edema      Roni Bread, MD  [facility] [phone]

## 2016-06-14 NOTE — Progress Notes (Signed)
[facility] Inpatient Rehab Note:  Referral received from Nash Mantis, CM  Following patient for acute inpatient rehab at [facility]   Will review after OT works with patient. Will follow patient for rehab needs and medical clearance. Will verify post rehab discharge plan with the patient/caregiver.    Roanna Raider, BSN, RN-BC, CRRN  [phone]  [facility] Liaison  [phone]

## 2016-06-14 NOTE — Plan of Care (Signed)
Problem: Safety  Goal: Patient will be free from injury during hospitalization   06/13/16 1138   Goal/Interventions addressed this shift   Patient will be free from injury during hospitalization  Assess patient's risk for falls and implement fall prevention plan of care per policy;Provide and maintain safe environment;Use appropriate transfer methods;Ensure appropriate safety devices are available at the bedside;Hourly rounding;Include patient/ family/ care giver in decisions related to safety;Assess for patients risk for elopement and implement Elopement Risk Plan per policy     Goal: Patient will be free from infection during hospitalization   06/12/16 2301   Goal/Interventions addressed this shift   Free from Infection during hospitalization Assess and monitor for signs and symptoms of infection;Monitor lab/diagnostic results       Problem: Pain  Goal: Pain at adequate level as identified by patient  Outcome: Progressing   06/13/16 1138   Goal/Interventions addressed this shift   Pain at adequate level as identified by patient Identify patient comfort function goal;Assess for risk of opioid induced respiratory depression, including snoring/sleep apnea. Alert healthcare team of risk factors identified.;Reassess pain within 30-60 minutes of any procedure/intervention, per Pain Assessment, Intervention, Reassessment (AIR) Cycle;Assess pain on admission, during daily assessment and/or before any "as needed" intervention(s);Evaluate if patient comfort function goal is met;Evaluate patient's satisfaction with pain management progress       Problem: Discharge Barriers  Goal: Patient will be discharged home or other facility with appropriate resources  Outcome: Progressing   06/13/16 1138   Goal/Interventions addressed this shift   Discharge to home or other facility with appropriate resources Provide appropriate patient education;Initiate discharge planning;Provide information on available health resources        Problem: Hemodynamic Status: Cardiac  Goal: Stable vital signs and fluid balance  Outcome: Progressing   06/13/16 1138   Goal/Interventions addressed this shift   Stable vital signs and fluid balance Monitor/assess vital signs and telemetry per unit protocol;Assess signs and symptoms associated with cardiac rhythm changes;Weigh on admission and record weight daily;Monitor intake/output per unit protocol and/or LIP order;Monitor lab values;Monitor for leg swelling/edema and report to LIP if abnormal       Problem: Moderate/High Fall Risk Score >5  Goal: Patient will remain free of falls  Outcome: Progressing   06/14/16 2243   OTHER   Moderate Risk (6-13) LOW-Fall Interventions Appropriate for Low Fall Risk       Problem: Renal Instability  Goal: Fluid and electrolyte balance are achieved/maintained  Outcome: Progressing   06/14/16 1701   Goal/Interventions addressed this shift   Fluid and electrolyte balance are achieved/maintained  Monitor intake and output every shift;Monitor/assess lab values and report abnormal values;Monitor daily weight;Assess and reassess fluid and electrolyte status     Goal: Free from infection  Outcome: Completed Date Met: 06/14/16      Problem: Post-op Phase - Cardiac Surgery  Goal: Effective breathing pattern is maintained  Outcome: Progressing   06/14/16 1701   Goal/Interventions addressed this shift   Effective breathing pattern is maintained  Maintain SpO2 level per LIP order;Reinforce use of ordered respiratory interventions (i.e. CPAP, BiPAP, Incentive Spirometer, Acapella)     Goal: Patient will remain free from post-op complications  Outcome: Completed Date Met: 06/14/16   06/14/16 1701   Goal/Interventions addressed this shift   Patient will remain free from post-op complications  Maintain sternal precautions as described in patient education;VTE prevention: Administer anticoagulants and/or apply anti-embolism stockings/devices as ordered;Monitor blood glucose AC and HS and  PRN after insulin  drip has been discontinued;Assess dressing and reinforce or change per LIP order     Goal: Mobility/activity is maintained at optimal level  Outcome: Progressing   06/14/16 1701   Goal/Interventions addressed this shift   Mobility/activity is maintained at optimal level  Increase mobility as tolerated/progressive mobility;Get patient OOB to chair for meals;Encourage independent activity per ability. Reinforce sternal precautions.;Plan activities to conserve energy. Plan rest periods.     Goal: Nutritional intake is adequate  Outcome: Progressing   06/14/16 1701   Goal/Interventions addressed this shift   Nutritional intake is adequate Monitor daily weights;Allow adequate time for meals       Problem: Compromised Tissue integrity  Goal: Damaged tissue is healing and protected  Outcome: Progressing   06/14/16 1701   Goal/Interventions addressed this shift   Damaged tissue is healing and protected  Increase activity as tolerated/progressive mobility;Keep intact skin clean and dry;Use bath wipes, not soap and water, for daily bathing     Goal: Nutritional status is improving  Outcome: Progressing   06/14/16 1701   Goal/Interventions addressed this shift   Nutritional status is improving Allow adequate time for meals;Include patient/patient care companion in decisions related to nutrition

## 2016-06-14 NOTE — Progress Notes (Addendum)
[facility] SUR[facility] ICU PROGRESS NOTE      POD: 3    Surgery:  Procedure(s):  1.  Coronary Artery Bypass x 4.  2.  Endoscopic, Left Leg Greater Saphenous Vein Harvest      EF: 70%  Surgeon: Bjorn Loser, MD      Assessment/Plan:   Active Problems:    NSTEMI (non-ST elevated myocardial infarction)      Neurological/?:    Intact   Hx CVA in past, no deficits    PT/OT:  recs acute rehab    Cardiovascular:   CAD s/p CABG   SR- 60s, continue on Amiodarone   Hypertension: allowing permissive HTN for renal pefusion, SBP <150.  Increased  Hydralazine and resumed home Clonidine, Coreg and Norvasc. 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 May need endo consult   HgbA1c: 6.3    Plan:   Monitor blood pressure, allowing SBP 150s for renal perfusion   Titrate up on po meds as allowed   F/u with nephrology regarding need for dialysis      Subjective:   Interval History: No issues overnight. Denies any specific complaint. Weak, tired and pain are mentioned.       Objective:   Vital signs for last  24 hours:  Temp:  [97.7 F (36.5 C)-98 F (36.7 C)] 97.8 F (36.6 C)  Heart Rate:  [66-106] 105  Resp Rate:  [12-30] 19  BP: (134-197)/(64-118) 162/74  Arterial Line BP: (142-204)/(58-97) 188/78    Neuro: Intact, moves all  Cardiac: RRR no murmur or rub  Lungs: Decreased bilat, but clear  Abdomen: soft NT ND BS hypoactive  Extremities: warm pulse intact 1+ edema LE  Wounds: C/D/I        Medications:   Scheduled Meds:  Current Facility-Administered Medications   Medication Dose Route Frequency   . acetaminophen  1,000 mg Oral Q8H [place]   . amiodarone  200 mg Oral Q12H [place]   . [START ON 06/15/2016] amLODIPine  10 mg Oral Daily   . aspirin EC  325 mg Oral Daily   . carvedilol  6.25 mg Oral Q12H [place]   . chlorhexidine  15 mL Mouth/Throat Q12H [facility]   . cloNIDine  0.1 mg Oral Q12H [facility]   . darbepoetin alfa  60 mcg Subcutaneous Weekly   . famotidine  20 mg Oral Daily   .  heparin (porcine)  5,000 Units Subcutaneous Q12H [facility]   . hydrALAZINE  50 mg Oral Q8H [place]   . insulin glargine  10 Units Subcutaneous QAM   . mupirocin   Nasal Q12H [facility]   . polyethylene glycol  17 g Oral Daily   . senna-docusate  2 tablet Oral Q12H   . simvastatin  20 mg Oral QHS   . sodium bicarbonate  650 mg Oral BID     Continuous Infusions:  . furosemide (LASIX) infusion 10 mg/hr (06/14/16 2001)   . niCARdipine 5 mg/hr (06/14/16 2150)         Labs:   CBC:   Recent Labs      06/14/16   0108   WBC  13.56*   Hgb  9.1*   Hematocrit  28.5*   Platelets  156     BMP:   Recent Labs      06/14/16   1932  06/14/16   0108   Sodium   --   138   Potassium  4.7  4.7   Chloride   --   107   CO2   --   17*   BUN   --   34.0*   Creatinine   --   3.0*   Glucose   --   142*   Magnesium  2.7*  2.7*   Calcium   --   8.1*   Phosphorus   --   7.3*     LFTs:   Recent Labs      06/13/16   0817   AST (SGOT)  29   ALT  10   Alkaline Phosphatase  42   Bilirubin, Total  0.5   Bilirubin, Direct  0.3   Bilirubin, Indirect  0.2   Protein, Total  5.0*   Albumin  3.0*     INR:   Recent  Labs      06/12/16   1148   PT  15.6*   PT INR  1.2*     ABG:   Recent Labs      06/13/16   2331   pH, Arterial  7.320*   pO2, Arterial  122.0*   pCO2, Arterial  42.2   HCO3, Arterial  21.2*   Base Excess, Arterial  -4.1*         Microbiology (past 7 days):         Radiology (past 7 days):   Chest xray: see above        [place], NP  Cardiovascular Surgery    Patient discussed with PA on rounds and I agree with the plan.  Sloan Leiter, MD

## 2016-06-14 NOTE — Plan of Care (Signed)
Problem: Renal Instability  Goal: Fluid and electrolyte balance are achieved/maintained  Outcome: Not Progressing   06/14/16 1701   Goal/Interventions addressed this shift   Fluid and electrolyte balance are achieved/maintained  Monitor intake and output every shift;Monitor/assess lab values and report abnormal values;Monitor daily weight;Assess and reassess fluid and electrolyte status     Goal: Free from infection  Outcome: Progressing   06/14/16 1701   Goal/Interventions addressed this shift   Free from infection Monitor/assess for signs and symptoms of infection       Problem: Post-op Phase - Cardiac Surgery  Goal: Effective breathing pattern is maintained  Outcome: Progressing   06/14/16 1701   Goal/Interventions addressed this shift   Effective breathing pattern is maintained  Maintain SpO2 level per LIP order;Reinforce use of ordered respiratory interventions (i.e. CPAP, BiPAP, Incentive Spirometer, Acapella)     Goal: Patient will remain free from post-op complications  Outcome: Progressing   06/14/16 1701   Goal/Interventions addressed this shift   Patient will remain free from post-op complications  Maintain sternal precautions as described in patient education;VTE prevention: Administer anticoagulants and/or apply anti-embolism stockings/devices as ordered;Monitor blood glucose AC and HS and PRN after insulin drip has been discontinued;Assess dressing and reinforce or change per LIP order     Goal: Mobility/activity is maintained at optimal level  Outcome: Progressing   06/14/16 1701   Goal/Interventions addressed this shift   Mobility/activity is maintained at optimal level  Increase mobility as tolerated/progressive mobility;Get patient OOB to chair for meals;Encourage independent activity per ability. Reinforce sternal precautions.;Plan activities to conserve energy. Plan rest periods.     Goal: Nutritional intake is adequate  Outcome: Progressing      Problem: Compromised Tissue integrity  Goal:  Damaged tissue is healing and protected  Outcome: Progressing   06/14/16 1701   Goal/Interventions addressed this shift   Damaged tissue is healing and protected  Increase activity as tolerated/progressive mobility;Keep intact skin clean and dry;Use bath wipes, not soap and water, for daily bathing     Goal: Nutritional status is improving  Outcome: Progressing   06/14/16 1701   Goal/Interventions addressed this shift   Nutritional status is improving Allow adequate time for meals;Include patient/patient care companion in decisions related to nutrition

## 2016-06-14 NOTE — Plan of Care (Signed)
Problem: Safety  Goal: Patient will be free from injury during hospitalization  Outcome: Progressing   06/13/16 1138   Goal/Interventions addressed this shift   Patient will be free from injury during hospitalization  Assess patient's risk for falls and implement fall prevention plan of care per policy;Provide and maintain safe environment;Use appropriate transfer methods;Ensure appropriate safety devices are available at the bedside;Hourly rounding;Include patient/ family/ care giver in decisions related to safety;Assess for patients risk for elopement and implement Elopement Risk Plan per policy     Goal: Patient will be free from infection during hospitalization  Outcome: Progressing   06/12/16 2301   Goal/Interventions addressed this shift   Free from Infection during hospitalization Assess and monitor for signs and symptoms of infection;Monitor lab/diagnostic results       Problem: Pain  Goal: Pain at adequate level as identified by patient  Outcome: Progressing   06/13/16 1138   Goal/Interventions addressed this shift   Pain at adequate level as identified by patient Identify patient comfort function goal;Assess for risk of opioid induced respiratory depression, including snoring/sleep apnea. Alert healthcare team of risk factors identified.;Reassess pain within 30-60 minutes of any procedure/intervention, per Pain Assessment, Intervention, Reassessment (AIR) Cycle;Assess pain on admission, during daily assessment and/or before any "as needed" intervention(s);Evaluate if patient comfort function goal is met;Evaluate patient's satisfaction with pain management progress       Problem: Discharge Barriers  Goal: Patient will be discharged home or other facility with appropriate resources  Outcome: Progressing   06/13/16 1138   Goal/Interventions addressed this shift   Discharge to home or other facility with appropriate resources Provide appropriate patient education;Initiate discharge planning;Provide information  on available health resources       Problem: Hemodynamic Status: Cardiac  Goal: Stable vital signs and fluid balance   06/13/16 1138   Goal/Interventions addressed this shift   Stable vital signs and fluid balance Monitor/assess vital signs and telemetry per unit protocol;Assess signs and symptoms associated with cardiac rhythm changes;Weigh on admission and record weight daily;Monitor intake/output per unit protocol and/or LIP order;Monitor lab values;Monitor for leg swelling/edema and report to LIP if abnormal       Problem: Moderate/High Fall Risk Score >5  Goal: Patient will remain free of falls   06/13/16 2000   OTHER   Moderate Risk (6-13) MOD-(VH Only) Yellow "Fall Risk" signage;MOD-(VH Only) Yellow slippers;MOD-(VH Only) Apply yellow "Fall Risk" arm band;MOD-Consider activation of bed alarm if appropriate;MOD-Use of chair-pad alarm when appropriate;MOD-Floor mat at bedside (where available) if appropriate       Problem: Renal Instability  Goal: Fluid and electrolyte balance are achieved/maintained  Outcome: Progressing   06/13/16 1138   Goal/Interventions addressed this shift   Fluid and electrolyte balance are achieved/maintained  Monitor intake and output every shift;Monitor/assess lab values and report abnormal values;Provide adequate hydration;Monitor daily weight;Assess for confusion/personality changes;Assess and reassess fluid and electrolyte status;Monitor for muscle weakness;Observe for seizure activity and initiate seizure precautions if indicated;Follow fluid restrictions/IV/PO parameters     Goal: Free from infection  Outcome: Progressing   06/13/16 1138   Goal/Interventions addressed this shift   Free from infection Monitor/assess for signs and symptoms of infection;Assess need for indwelling catheter every shift and discuss with LIP       Problem: Post-op Phase - Cardiac Surgery  Goal: Effective breathing pattern is maintained  Outcome: Progressing   06/13/16 1138   Goal/Interventions addressed  this shift   Effective breathing pattern is maintained  Position patient for maximum  ventilatory efficiency with HOB to a minimum of 30 degrees when hemodynamically stable;Maintain SpO2 level per LIP order;Maintain CO2 level per LIP order;Monitor end-tidal CO2 level per LIP order;Reinforce use of ordered respiratory interventions (i.e. CPAP, BiPAP, Incentive Spirometer, Acapella);Monitor for sleep apnea;Monitor for medication-induced respiratory depression     Goal: Patient will remain free from post-op complications  Outcome: Progressing   06/13/16 1138   Goal/Interventions addressed this shift   Patient will remain free from post-op complications  Maintain sternal precautions as described in patient education;Maintain pericare/Foley care per protocol;VTE prevention: Administer anticoagulants and/or apply anti-embolism stockings/devices as ordered;Monitor/assess blood glucose per LIP orders;Assess dressing and reinforce or change per LIP order;Assess surgical incision/wound site and treat per LIP order     Goal: Mobility/activity is maintained at optimal level  Outcome: Progressing   06/13/16 1138   Goal/Interventions addressed this shift   Mobility/activity is maintained at optimal level  Increase mobility as tolerated/progressive mobility;Get patient OOB to chair after 4-8 hours of extubation;Get patient OOB to chair for meals;Encourage independent activity per ability. Reinforce sternal precautions.;Plan activities to conserve energy. Plan rest periods.     Goal: Nutritional intake is adequate  Outcome: Progressing   06/13/16 1138   Goal/Interventions addressed this shift   Nutritional intake is adequate Monitor daily weights;Assist patient with meals/food selection;Encourage/perform oral hygiene as appropriate;Allow adequate time for meals;Assess anorexia, appetite, and amount of meal/food tolerated       Problem: Compromised Tissue integrity  Goal: Damaged tissue is healing and protected  Outcome:  Progressing   06/13/16 1138   Goal/Interventions addressed this shift   Damaged tissue is healing and protected  Monitor/assess Braden scale every shift;Provide wound care per wound care algorithm;Reposition patient every 2 hours and as needed unless able to reposition self;Increase activity as tolerated/progressive mobility;Relieve pressure to bony prominences for patients at moderate and high risk;Keep intact skin clean and dry;Use bath wipes, not soap and water, for daily bathing;Avoid shearing injuries;Monitor external devices/tubes for correct placement to prevent pressure, friction and shearing;Monitor patient's hygiene practices     Goal: Nutritional status is improving  Outcome: Progressing   06/13/16 1138   Goal/Interventions addressed this shift   Nutritional status is improving Assist patient with eating;Encourage patient to take dietary supplement(s) as ordered;Allow adequate time for meals;Include patient/patient care companion in decisions related to nutrition

## 2016-06-14 NOTE — UM Notes (Signed)
CSR date: 06/14/16    Admit date: 06/08/16  Payer:Payor: MEDICARE / Plan: MEDICARE PART A AND B / Product Type: *No Product type* /       ICD-10-CM    1. Chest pain, unspecified type R07.9    2. Elevated d-dimer R79.89    3. History of CVA (cerebrovascular accident) Z86.73    4. Hypertensive emergency I16.1    5. NSTEMI (non-ST elevated myocardial infarction) I21.4    6. Renal insufficiency N28.9        LOC on unit: Inpt  ICU CVICU  -MCG Guideline: Cardiology  Summary of initial admission presentation: 65 years old female with h/o HTN, type II DM, and CKD admitted to [facility] with hypertensive crisis and chest pain. She had heart cath showing multivessel disease. She was sent to [facility] for CABG evaluation. She was also noted to have acute on chronic renal disease.     Bogar, Tenna Child, MD Physician Addendum Cardiac Surgery  Progress Notes   Date of Service: 06/14/2016 5:12 AM Creation Time: 06/13/2016 9:30 PM         [] Hide copied text  CV SURGERY ICU PROGRESS NOTE      POD: 2    Surgery:  3. Procedure(s):  4. Coronary Artery Bypass x 4.  5. 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  Coreg and resume home Clonidine. Resume home  Norvasc and Cozaar as needed.    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 Nephrology following   Metabolic Acidosis: improving 7.32/42/122/23, continue po Bicarb per renal    Foley: In for strict I&Os    Hematological:   Acute on Chronic Anemia: Hematocrit: 28 Platelets: 156   DVT prophylaxis: SCD    Infectious Disease:   Afebrile; WBC: 13.5k   Lines: PIVs    Endocrinological:   DM II- insulin dependent, 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 May need endo consult   HgbA1c: 6.3    Plan:   Monitor creatinine   Apprectiate nephrology input              BP 179/86   Pulse 72   Temp 97.8 F (36.6 C) (Oral)   Resp 19   Ht 1.524 m (5')   Wt 72.6 kg (160 lb 0.9 oz)   SpO2 95%   BMI 31.26 kg/m   Scheduled Meds:  Current Facility-Administered Medications   Medication Dose Route Frequency   . acetaminophen  1,000 mg Oral Q8H [place]   . amiodarone  200 mg Oral Q12H [facility]   . amLODIPine  5 mg Oral Daily   .  aspirin EC  325 mg Oral Daily   . carvedilol  6.25 mg Oral Q12H [place]   . chlorhexidine  15 mL Mouth/Throat Q12H [facility]   . cloNIDine  0.1 mg Oral Q12H [facility]   . darbepoetin alfa  60 mcg Subcutaneous Weekly   . famotidine  20 mg Oral Daily   . heparin (porcine)  5,000 Units Subcutaneous Q12H [facility]   . hydrALAZINE  25 mg Oral Q8H [place]   . insulin glargine  10 Units Subcutaneous QAM   . mupirocin   Nasal Q12H [place]   . senna-docusate  1 tablet Oral QHS   . simvastatin  20 mg Oral QHS   . sodium bicarbonate  650 mg Oral BID     Continuous Infusions:  . furosemide (LASIX) infusion 10 mg/hr (06/14/16 1600)     PRN Meds:.acetaminophen **FOLLOWED BY** [START ON 06/15/2016] acetaminophen, bisacodyl, Nursing communication: Adult Hypoglycemia Treatment Algorithm **AND** dextrose **AND** dextrose **AND** glucagon (rDNA) **AND** Nursing communication: Document Significant Event Note, HYDROmorphone, insulin lispro, insulin lispro, lactated ringers, ondansetron **OR** ondansetron, traMADol    Results     Procedure Component Value Units Date/Time    Glucose Whole Blood - POCT [[id number]]  (Abnormal) Collected:  06/14/16  1423     Updated:  06/14/16 1426     POCT - Glucose Whole blood 191 (H) mg/dL     Creatinine, urine, random [[id number]] Collected:  06/14/16 0847    Specimen:  Urine Updated:  06/14/16 1104     Urine Creatinine, Random 55.0 mg/dL     Urea nitrogen, urine [[id number]] Collected:  06/14/16 0847    Specimen:  Urine Updated:  06/14/16 1104     Urine Urea Nitrogen Random 227 mg/dL     UA, Reflex to Microscopic [[id number]]  (Abnormal) Collected:  06/14/16 0847    Specimen:  Urine Updated:  06/14/16 1014     Urine Type Catheterized, F     Color, UA Yellow     Clarity, UA Clear     Specific Gravity UA 1.013     Urine pH 5.0     Leukocyte Esterase, UA Negative     Nitrite, UA Negative     Protein, UR >300 (A)     Glucose, UA 50 (A)     Ketones UA Negative     Urobilinogen, UA Normal mg/dL      Bilirubin, UA Negative     Blood, UA Small (A)     RBC, UA 0 - 2 /hpf      WBC, UA 0 - 5 /hpf      Oval Fat Bodies, UA Rare (A) /hpf     Glucose Whole Blood - POCT [[id number]]  (Abnormal) Collected:  06/14/16 0811     Updated:  06/14/16 0814     POCT - Glucose Whole blood 150 (H) mg/dL     Glucose Whole Blood - POCT [[id number]]  (Abnormal) Collected:  06/14/16 0625     Updated:  06/14/16 0630     POCT - Glucose Whole blood 145 (H) mg/dL     Glucose Whole Blood - POCT [[id number]]  (Abnormal) Collected:  06/14/16 0406     Updated:  06/14/16 0409     POCT - Glucose Whole blood 143 (H) mg/dL     Glucose Whole Blood - POCT [[id number]]  (Abnormal) Collected:  06/14/16 0238     Updated:  06/14/16 0245     POCT - Glucose Whole blood 152 (H) mg/dL  Phosphorus [[id number]]  (Abnormal) Collected:  06/14/16 0108    Specimen:  Blood Updated:  06/14/16 0136     Phosphorus 7.3 (H) mg/dL     Basic metabolic panel (QAM) [[id number]]  (Abnormal) Collected:  06/14/16 0108    Specimen:  Blood Updated:  06/14/16 0136     Glucose 142 (H) mg/dL      BUN 34.0 (H) mg/dL      Creatinine 3.0 (H) mg/dL      Calcium 8.1 (L) mg/dL      Sodium 138 mEq/L       Potassium 4.7 mEq/L      Chloride 107 mEq/L      CO2 17 (L) mEq/L     Magnesium (QAM) [[id number]]  (Abnormal) Collected:  06/14/16 0108    Specimen:  Blood Updated:  06/14/16 0136     Magnesium 2.7 (H) mg/dL     GFR [[id number]] Collected:  06/14/16 0108     Updated:  06/14/16 0136     EGFR 15.7    CBC without differential [[id number]]  (Abnormal) Collected:  06/14/16 0108    Specimen:  Blood from Blood Updated:  06/14/16 0126     WBC 13.56 (H) x10 3/uL      Hgb 9.1 (L) g/dL      Hematocrit 28.5 (L) %      Platelets 156 x10 3/uL      RBC 3.84 (L) x10 6/uL      MCV 74.2 (L) fL      MCH 23.7 (L) pg      MCHC 31.9 (L) g/dL      RDW 21 (H) %      MPV 11.1 fL      Nucleated RBC 0.1 /100 WBC      Absolute NRBC 0.02 (H) x10 3/uL     Glucose Whole Blood - POCT [[id number]]  (Abnormal) Collected:  06/14/16 0107     Updated:  06/14/16 0111     POCT - Glucose Whole blood 141 (H) mg/dL     Blood gas, arterial [[id number]]  (Abnormal) Collected:  06/13/16 2331    Specimen:  Blood, Arterial Updated:  06/13/16 2345     pH, Arterial 7.320 (L)     pCO2, Arterial 42.2 mmhg      pO2, Arterial 122.0 (H) mmhg      HCO3, Arterial 21.2 (L) mEq/L      Arterial Total CO2 22.6 (L) mEq/L      Base Excess, Arterial -4.1 (L) mEq/L      O2 Sat, Arterial 99.0 %      ABG CollectionSite Right Radl     Temperature 36.6    Glucose Whole Blood - POCT [[id number]]  (Abnormal) Collected:  06/13/16 2335     Updated:  06/13/16 2338     POCT - Glucose Whole blood 118 (H) mg/dL     Glucose Whole Blood - POCT [[id number]]  (Abnormal) Collected:  06/13/16 2006     Updated:  06/13/16 2338     POCT - Glucose Whole blood 155 (H) mg/dL     Glucose Whole Blood - POCT [[id number]]  (Abnormal) Collected:  06/13/16 2102     Updated:  06/13/16 2338     POCT - Glucose Whole blood 144 (H) mg/dL     Basic Metabolic Panel [[id number]]  (Abnormal) Collected:  06/13/16 2059    Specimen:  Blood Updated:  06/13/16 2134     Glucose 141 (H) mg/dL      BUN  31.0 (H) mg/dL      Creatinine  2.8 (H) mg/dL      Calcium 8.0 (L) mg/dL      Sodium 138 mEq/L      Potassium 4.5 mEq/L      Chloride 108 mEq/L      CO2 17 (L) mEq/L     GFR [[id number]] Collected:  06/13/16 2059     Updated:  06/13/16 2134     EGFR 17.0    Glucose Whole Blood - POCT [[id number]]  (Abnormal) Collected:  06/13/16 1857     Updated:  06/13/16 1858     POCT - Glucose Whole blood 161 (H) mg/dL     Glucose Whole Blood - POCT [[id number]]  (Abnormal) Collected:  06/13/16 1710     Updated:  06/13/16 1713     POCT - Glucose Whole blood 149 (H) mg/dL         Echocardiogram Adult Transesophageal    Result Date: 05/31/2016  The left ventricle is normal in size. Left ventricular function is hyperdynamic. Regional wall motion abnormalities are not present. Estimated EF is 70%. The left atrium and left atrial appendage demonstrate no clot. The aortic valve is trileaflet, sclerotic.  There is mild aortic insufficiency. There is no visual aortic stenosis. The aortic root is grossly normal. The mitral valve is structurally intact. There is mild mitral insufficiency. The right ventricle is normal in size and normal in function. The right atrium demonstrates no clot. The tricuspid valve is structurally intact. There is mild tricuspid insufficiency. The pulmonic valve is structurally not well seen. There is trace pulmonic insufficiency. No pericardial effusion or intracardiac masses. The atrial septum is structurally intact. There is no shunt by color flow doppler. The descending thoracic aorta was visualized and appeared to have mild, nonmobile mild detectable atherosclerosis, but no thrombus, or spontaneous echo contrast. There is no evidence of a dissection or aneurysm formation in the ascending or descending aorta. Limited visualization of the aortic arch revealed no clear aortic dissection. CONCLUSION: 1. No evidence of an aortic dissection 2. Dynamic left ventricular function with an EF of 70% 3. Adequately functioning cardiac valves Gavin Pound,  MD 05/31/2016 3:26 PM     Echocardiogram Adult Complete W Clr/ Dopp Waveform    Result Date: 05/17/2016  1.  The quality of the study is technically adequate for interpretation. 2.  The left ventricle is normal in size. Left ventricular systolic function is normal. There are no regional wall motion abnormalities. Estimated EF is 65%. There is mild concentric left ventricular hypertrophy. There is evidence of abnormal diastolic LV function. 3.  The left atrium is moderately dilated in size. 4.  The aortic valve is trileaflet. Sclerotic aortic valve. Normal valve excursion is present. There is mild to moderate aortic insufficiency. No aortic stenosis is present. The aortic root is normal in size. 5.  The mitral valve is structurally normal. Mild mitral insufficiency is present. 6.  The right ventricle is normal in size and function. 7.  The right atrium is normal in size. 8. The tricuspid valve is structurally normal. There is no significant tricuspid insufficiency. 9. The pulmonic valve is structurally normal. There is no significant pulmonic insufficiency. 10. There is no evidence of pulmonary hypertension. 11. Trace pericardial effusion.. 12. The atrial septum is structurally normal. No shunt by color flow doppler. CONCLUSION: Left ventricle is normal in size with normal systolic function. LVEF 65%. Diastolic dysfunction noted. Mild concentric left ventricular hypertrophy. Left atrium moderately  dilated. Sclerotic aortic valve. Mild to moderate aortic regurgitation. Mild mitral regurgitation. Trace pericardial effusion Darnelle Spangle, MD 05/17/2016 4:23 PM     Chest 2 Views    Result Date: 05/31/2016  Possible mild left basilar atelectasis. Loyal Buba, MD 05/31/2016 2:14 AM     Ct Head Wo Contrast    Result Date: 06/01/2016   No acute intracranial abnormality is seen.     Marcos Eke, MD 06/01/2016 2:20 PM     Nm Pulmonary Vent/perf (vq Scan)    Result Date: 05/31/2016   Low probability for pulmonary embolism. Ruby Cola, MD 05/31/2016 10:43 AM     US Renal Kidney    Result Date: 05/16/2016  1. No hydronephrosis. 2. Asymmetric kidney size smaller on the right.. 3. Left renal simple cyst. Ardelia Mems, MD 05/16/2016 5:58 PM     X-ray Chest Ap Portable (daily)    Result Date: 06/14/2016   Bibasilar atelectasis and/or patchy consolidation and small bilateral pleural effusions. No pneumothorax identified. Germain Osgood, MD 06/14/2016 7:43 AM     X-ray Chest Ap Portable (daily)    Result Date: 06/13/2016   1. Increasing basilar atelectasis and decreased lung volumes following extubation. 2. Negative for pneumothorax. Ferd Hibbs, MD 06/13/2016 7:55 AM     X-ray Chest Ap Portable (once)    Result Date: 06/12/2016   Low lung volumes with minimal atelectasis in intubated patient status post sternotomy. Bonna Gains, MD 06/12/2016 12:28 PM     Xr Chest Ap Portable    Result Date: 06/09/2016   No acute process. Nicholes Rough, MD 06/09/2016 5:59 PM     Xr Chest Ap Portable    Result Date: 05/17/2016    Mild pulmonary edema, mildly improved. Maia Breslow, MD 05/17/2016 1:41 PM     Chest Ap Portable    Result Date: 05/16/2016  1. Congestive failure. This is new when compared to 09/30/2015. Dierdre Searles, MD 05/16/2016 5:47 AM     US Carotid Duplex Dopp Comp Bilateral    Result Date: 06/06/2016   Mild to moderate bilateral carotid bulb calcified plaque without hemodynamically significant stenosis. Ferd Hibbs, MD 06/06/2016 5:02 PM     [place] Groin Pseudoaneurysm Right W Dopp    Result Date: 06/08/2016   No evidence of AV fistula or pseudoaneurysm, right groin. Pauline Aus, MD 06/08/2016 3:04 PM     US Venous Low Extrem Duplex Dopp Ltd Bilat    Result Date: 06/10/2016  1. Widely patent bilateral great and small saphenous veins, no evidence of superficial thrombosis. Mild diffuse wall thickening of the small saphenous veins bilaterally. 2. Vein measurements as above. Hurley Cisco, MD 06/10/2016 11:38 AM     US Renal  Artery Duplx Dopp Complete    Result Date: 05/19/2016   1. No evidence of significant renal artery stenosis. 2. Limited visualization of the mid left renal artery. If the patient has persistent hypertension despite adequate medical renal treatment, further workup with MRA can be considered. 3. Elevated resistive indices bilaterally. This is often indicative of medical renal disease.  Pauline Aus, MD 05/19/2016 5:31 PM

## 2016-06-14 NOTE — PT Eval Note (Addendum)
[facility]   Physical Therapy Re-Evaluation     Patient: Jacqueline Weber    MRN#: [id number]   [facility]  Bed: FI204/FI204-01    Discharge Recommendations:   Discharge Recommendation: Acute Rehab (with good potential to progress to home with HHPT)       DME Recommendation: DME Recommended for Discharge:  (pt has cane, walker)    If Acute Rehab (with good potential to progress to home with HHPT) recommended discharge disposition is not available, patient will need min-mod assist for all mobility, equipment listed above, and HHPT.        Assessment:   Jacqueline Weber is a 65 y.o. female admitted 06/08/2016, now s/p CABG x4. She transfers bed to chair with min A. Educated pt on sternal precautions and pt demonstrated good understanding. Pt has the following impairments and would benefit from skilled PT to increase functional mobility for improved safety.    Impairments: Decreased functional mobility, impaired gait, decreased endurance, decreased balance    Therapy Diagnosis: Impaired functional mobility    Rehabilitation Potential: Good    Treatment Activities: Re-evaluation and gait training  Educated the patient to role of physical therapy, plan of care, goals of therapy and safety with mobility and ADLs.    Plan:   PT Frequency: 3-4x/wk    Treatment/Interventions: Gait training, therapeutic exercise, therapeutic activity, neuromuscular reeducation, endurance training    Risks/benefits/POC discussed with pt       Precautions and Contraindications:   Falls  Sternal  Speaks [place] - family interprets as needed    Consult received for Jacqueline Weber for PT Evaluation and Treatment.  Patient's medical condition is appropriate for Physical Therapy intervention at this time.      History of Present Illness:   Jacqueline Weber is a 66 y.o. female admitted on 06/08/2016 from [facility] for evaluation of CABG, now s/p CABG x4.    Reason for re-evaluation: s/p CABG x4    Medical  Diagnosis: CAD (coronary artery disease) [I25.10]  NSTEMI (non-ST elevated myocardial infarction) [I21.4]    Imaging/Tests/Labs:  X-ray chest AP portable (daily) [[id number]]Collected: 06/14/16 0742 Order Status: CompletedUpdated: 06/14/16 0747  Impression:    Bibasilar atelectasis and/or patchy consolidation and small  bilateral pleural effusions. No pneumothorax identified.    Subjective:   Pt reports she is a little bit dizzy.  Patient is agreeable to participation in the therapy session. Nursing clears pt for therapy.    Patient Goal: To get better    Pain:   Pt had no c/o pain throughout session.     Objective:   Patient received  in bed  with telemetry, 2L NC O2, IV, and a-line in place.    Cognitive Status:  Pt is alert and oriented throughout session.   Follows commands without difficulty    Educated pt on sternal precautions    Musculoskeletal Examination  RLE AROM: WFL  LLE AROM: WFL    RLE Strength: quads and ankle dorsiflexors 5/5  LLE Strength: quads and ankle dorsiflexors 5/5    Balance  Static Sitting: Good  Dynamic Sitting: Fair+  Static Standing: Fair  Dynamic Standing: Fair    Functional Mobility  Supine to Sit: mod A  Sit to Stand: min A  Stand to Sit: min A  Transfers: Bed to chair: min A    Ambulation  Level of assistance required: min A  Ambulation Distance: 5'  Pattern: generally unsteady, no LOB  Device Used for Ambulation: none  Weightbearing Status: no restrictions    AM-PACT Inpatient Short Forms  Inpatient AM-PACT Performed? (PT): Basic Mobility Inpatient Short Form  AM-PACT "6 Clicks" Basic Mobility Inpatient Short Form  Turning Over in Bed: A little  Sitting Down On/Standing From Armchair: A little  Lying on Back to Sitting on Side of Bed: A lot  Assist Moving to/from Bed to Chair: A little  Assist to Walk in Hospital Room: A little  Assist to Climb 3-5 Steps with Railing: A lot  PT Basic Mobility Raw Score: 16  CMS 0-100% Score: 54.16%    Participation and Activity  Tolerance  Participation Effort: Good  Endurance: Fair  Vitals stable throughout session    Patient left with call bell within reach, all needs met, SCDs in place, fall mat in place, and chair alarm in place and all questions answered. RN notified of session outcome and patient response.     Goals:  Goals  Time for Goal Acheivement: 5 visits  Pt Will Go Supine To Sit: with contact guard assist, to maximize functional mobility and independence  Pt Will Perform Sit to Stand: with supervision, to maximize functional mobility and independence  Pt Will Transfer Bed/Chair: with supervision, to maximize functional mobility and independence  Pt Will Ambulate: > 200 feet, with supervision, to maximize functional mobility and independence  Pt Will Go Up / Down Stairs: 3-5 stairs, with contact guard assist, to maximize functional mobility and independence      Louanna Raw, PT, DPT   Pager number: [phone]    Time of Treatment  PT Received On: 06/14/16  Start Time: 1002  Stop Time: 1021  Time Calculation (min): 19 min

## 2016-06-15 ENCOUNTER — Inpatient Hospital Stay: Payer: Medicare Other

## 2016-06-15 LAB — BASIC METABOLIC PANEL
BUN: 48 mg/dL — ABNORMAL HIGH (ref 7.0–19.0)
CO2: 23 mEq/L (ref 22–29)
Calcium: 8 mg/dL — ABNORMAL LOW (ref 8.5–10.5)
Chloride: 102 mEq/L (ref 100–111)
Creatinine: 3.2 mg/dL — ABNORMAL HIGH (ref 0.6–1.0)
Glucose: 178 mg/dL — ABNORMAL HIGH (ref 70–100)
Potassium: 4.7 mEq/L (ref 3.5–5.1)
Sodium: 133 mEq/L — ABNORMAL LOW (ref 136–145)

## 2016-06-15 LAB — CBC
Absolute NRBC: 0.07 10*3/uL — ABNORMAL HIGH
Hematocrit: 28.9 % — ABNORMAL LOW (ref 37.0–47.0)
Hgb: 9.4 g/dL — ABNORMAL LOW (ref 12.0–16.0)
MCH: 23.7 pg — ABNORMAL LOW (ref 28.0–32.0)
MCHC: 32.5 g/dL (ref 32.0–36.0)
MCV: 73 fL — ABNORMAL LOW (ref 80.0–100.0)
MPV: 10.8 fL (ref 9.4–12.3)
Nucleated RBC: 0.6 /100 WBC (ref 0.0–1.0)
Platelets: 190 10*3/uL (ref 140–400)
RBC: 3.96 10*6/uL — ABNORMAL LOW (ref 4.20–5.40)
RDW: 21 % — ABNORMAL HIGH (ref 12–15)
WBC: 12.19 10*3/uL — ABNORMAL HIGH (ref 3.50–10.80)

## 2016-06-15 LAB — GLUCOSE WHOLE BLOOD - POCT
Whole Blood Glucose POCT: 166 mg/dL — ABNORMAL HIGH (ref 70–100)
Whole Blood Glucose POCT: 193 mg/dL — ABNORMAL HIGH (ref 70–100)

## 2016-06-15 LAB — PHOSPHORUS: Phosphorus: 6.6 mg/dL — ABNORMAL HIGH (ref 2.3–4.7)

## 2016-06-15 LAB — MAGNESIUM: Magnesium: 2.5 mg/dL (ref 1.6–2.6)

## 2016-06-15 LAB — GFR: EGFR: 14.6

## 2016-06-15 MED ORDER — ATORVASTATIN CALCIUM 10 MG PO TABS
40.0000 mg | ORAL_TABLET | Freq: Every evening | ORAL | Status: DC
Start: 2016-06-15 — End: 2016-06-15

## 2016-06-15 MED ORDER — LABETALOL HCL 5 MG/ML IV SOLN
10.0000 mg | Freq: Four times a day (QID) | INTRAVENOUS | Status: DC | PRN
Start: 2016-06-15 — End: 2016-06-15

## 2016-06-15 MED ORDER — MORPHINE SULFATE 4 MG/ML IJ/IV SOLN (WRAP)
2.0000 mg | Status: DC | PRN
Start: 2016-06-15 — End: 2016-06-15

## 2016-06-15 MED ORDER — SENNOSIDES-DOCUSATE SODIUM 8.6-50 MG PO TABS
1.0000 | ORAL_TABLET | Freq: Two times a day (BID) | ORAL | Status: DC
Start: 2016-06-15 — End: 2016-06-19
  Administered 2016-06-15 – 2016-06-18 (×7): 1 via ORAL
  Filled 2016-06-15 (×8): qty 1

## 2016-06-15 MED ORDER — BISACODYL 5 MG PO TBEC
10.0000 mg | DELAYED_RELEASE_TABLET | Freq: Every day | ORAL | Status: DC
Start: 2016-06-15 — End: 2016-06-15
  Filled 2016-06-15: qty 2

## 2016-06-15 MED ORDER — SITAGLIPTIN PHOSPHATE 25 MG PO TABS
25.0000 mg | ORAL_TABLET | Freq: Every day | ORAL | Status: DC
Start: 2016-06-15 — End: 2016-06-19
  Administered 2016-06-15 – 2016-06-19 (×5): 25 mg via ORAL
  Filled 2016-06-15 (×5): qty 1

## 2016-06-15 MED ORDER — GLUCAGON 1 MG IJ SOLR (WRAP)
1.0000 mg | INTRAMUSCULAR | Status: DC | PRN
Start: 2016-06-15 — End: 2016-06-15

## 2016-06-15 MED ORDER — SENNOSIDES-DOCUSATE SODIUM 8.6-50 MG PO TABS
2.0000 | ORAL_TABLET | Freq: Every evening | ORAL | Status: DC
Start: 2016-06-15 — End: 2016-06-15

## 2016-06-15 MED ORDER — CLONIDINE HCL 0.2 MG PO TABS
0.2000 mg | ORAL_TABLET | Freq: Two times a day (BID) | ORAL | Status: DC
Start: 2016-06-15 — End: 2016-06-15

## 2016-06-15 MED ORDER — ISOSORBIDE MONONITRATE ER 30 MG PO TB24
30.0000 mg | ORAL_TABLET | Freq: Every day | ORAL | Status: DC
Start: 2016-06-15 — End: 2016-06-15

## 2016-06-15 MED ORDER — GLUCOSE 40 % PO GEL
15.0000 g | ORAL | Status: DC | PRN
Start: 2016-06-15 — End: 2016-06-15

## 2016-06-15 MED ORDER — HYDROCHLOROTHIAZIDE 25 MG PO TABS
25.0000 mg | ORAL_TABLET | Freq: Every day | ORAL | Status: DC
Start: 2016-06-15 — End: 2016-06-15

## 2016-06-15 MED ORDER — HYDRALAZINE HCL 20 MG/ML IJ SOLN
10.0000 mg | Freq: Four times a day (QID) | INTRAMUSCULAR | Status: DC | PRN
Start: 2016-06-15 — End: 2016-06-15

## 2016-06-15 MED ORDER — INSULIN LISPRO 100 UNIT/ML SC SOLN
1.0000 [IU] | Freq: Every evening | SUBCUTANEOUS | Status: DC
Start: 2016-06-15 — End: 2016-06-15

## 2016-06-15 MED ORDER — CARVEDILOL 25 MG PO TABS
25.0000 mg | ORAL_TABLET | Freq: Three times a day (TID) | ORAL | Status: DC
Start: 2016-06-15 — End: 2016-06-15

## 2016-06-15 MED ORDER — NIFEDIPINE ER OSMOTIC RELEASE 30 MG PO TB24
60.0000 mg | ORAL_TABLET | Freq: Every day | ORAL | Status: DC
Start: 2016-06-15 — End: 2016-06-15

## 2016-06-15 MED ORDER — DEXTROSE 10 % IV BOLUS
125.0000 mL | INTRAVENOUS | Status: DC | PRN
Start: 2016-06-15 — End: 2016-06-15

## 2016-06-15 MED ORDER — INSULIN LISPRO 100 UNIT/ML SC SOLN
1.0000 [IU] | Freq: Three times a day (TID) | SUBCUTANEOUS | Status: DC
Start: 2016-06-15 — End: 2016-06-15

## 2016-06-15 MED ORDER — HYDRALAZINE HCL 25 MG PO TABS
100.0000 mg | ORAL_TABLET | Freq: Three times a day (TID) | ORAL | Status: DC
Start: 2016-06-15 — End: 2016-06-15

## 2016-06-15 MED ORDER — CARVEDILOL 6.25 MG PO TABS
6.2500 mg | ORAL_TABLET | Freq: Two times a day (BID) | ORAL | Status: DC
Start: 2016-06-15 — End: 2016-06-15

## 2016-06-15 MED ORDER — ASPIRIN 325 MG PO TBEC
325.0000 mg | DELAYED_RELEASE_TABLET | Freq: Every day | ORAL | Status: DC
Start: 2016-06-15 — End: 2016-06-15

## 2016-06-15 MED ORDER — LOSARTAN POTASSIUM 100 MG PO TABS
100.0000 mg | ORAL_TABLET | Freq: Every day | ORAL | Status: DC
Start: 2016-06-15 — End: 2016-06-15

## 2016-06-15 MED ORDER — TRAMADOL HCL 50 MG PO TABS
50.0000 mg | ORAL_TABLET | Freq: Three times a day (TID) | ORAL | Status: DC | PRN
Start: 2016-06-15 — End: 2016-06-19
  Administered 2016-06-16 – 2016-06-18 (×4): 50 mg via ORAL
  Filled 2016-06-15 (×4): qty 1

## 2016-06-15 MED ORDER — ONDANSETRON HCL 4 MG/2ML IJ SOLN
4.0000 mg | Freq: Every day | INTRAMUSCULAR | Status: DC | PRN
Start: 2016-06-15 — End: 2016-06-19

## 2016-06-15 MED ORDER — AMIODARONE HCL 200 MG PO TABS
400.0000 mg | ORAL_TABLET | Freq: Two times a day (BID) | ORAL | Status: DC
Start: 2016-06-15 — End: 2016-06-17
  Administered 2016-06-15 – 2016-06-17 (×5): 400 mg via ORAL
  Filled 2016-06-15 (×5): qty 2

## 2016-06-15 MED ORDER — ACETAMINOPHEN 325 MG PO TABS
650.0000 mg | ORAL_TABLET | ORAL | Status: DC | PRN
Start: 2016-06-15 — End: 2016-06-15

## 2016-06-15 MED ORDER — POLYETHYLENE GLYCOL 3350 17 G PO PACK
17.0000 g | PACK | Freq: Every day | ORAL | Status: DC
Start: 2016-06-15 — End: 2016-06-19
  Administered 2016-06-16 – 2016-06-18 (×3): 17 g via ORAL
  Filled 2016-06-15 (×5): qty 1

## 2016-06-15 MED ORDER — CARVEDILOL 6.25 MG PO TABS
12.5000 mg | ORAL_TABLET | Freq: Two times a day (BID) | ORAL | Status: DC
Start: 2016-06-15 — End: 2016-06-16
  Administered 2016-06-15 – 2016-06-16 (×3): 12.5 mg via ORAL
  Filled 2016-06-15 (×3): qty 2

## 2016-06-15 MED ORDER — HEPARIN SODIUM (PORCINE) 5000 UNIT/ML IJ SOLN
5000.0000 [IU] | Freq: Two times a day (BID) | INTRAMUSCULAR | Status: DC
Start: 2016-06-15 — End: 2016-06-15

## 2016-06-15 MED ORDER — IODIXANOL 320 MG/ML IV SOLN
90.0000 mL | Freq: Once | INTRAVENOUS | Status: DC | PRN
Start: 2016-06-15 — End: 2016-06-15

## 2016-06-15 MED ORDER — ACETAMINOPHEN 325 MG PO TABS
650.0000 mg | ORAL_TABLET | ORAL | Status: DC | PRN
Start: 2016-06-15 — End: 2016-06-19
  Administered 2016-06-16 – 2016-06-18 (×4): 650 mg via ORAL
  Filled 2016-06-15 (×4): qty 2

## 2016-06-15 NOTE — Progress Notes (Signed)
Shift Note:    Received report and assumed care pf PT at 0000. Orders and labs reviewed. Pump check complete. See flowsheet for VS and complete complex assessment. No issues overnight. Labs drawna nd reviewed. Pt c/o incisional pain-meds given with relief. Pt stable. Report given to AM shift at 0730

## 2016-06-15 NOTE — Plan of Care (Signed)
Problem: Safety  Goal: Patient will be free from injury during hospitalization  Outcome: Progressing   06/13/16 1138   Goal/Interventions addressed this shift   Patient will be free from injury during hospitalization  Assess patient's risk for falls and implement fall prevention plan of care per policy;Provide and maintain safe environment;Use appropriate transfer methods;Ensure appropriate safety devices are available at the bedside;Hourly rounding;Include patient/ family/ care giver in decisions related to safety;Assess for patients risk for elopement and implement Elopement Risk Plan per policy     Goal: Patient will be free from infection during hospitalization  Outcome: Progressing   06/12/16 2301   Goal/Interventions addressed this shift   Free from Infection during hospitalization Assess and monitor for signs and symptoms of infection;Monitor lab/diagnostic results       Problem: Pain  Goal: Pain at adequate level as identified by patient  Outcome: Progressing   06/13/16 1138   Goal/Interventions addressed this shift   Pain at adequate level as identified by patient Identify patient comfort function goal;Assess for risk of opioid induced respiratory depression, including snoring/sleep apnea. Alert healthcare team of risk factors identified.;Reassess pain within 30-60 minutes of any procedure/intervention, per Pain Assessment, Intervention, Reassessment (AIR) Cycle;Assess pain on admission, during daily assessment and/or before any "as needed" intervention(s);Evaluate if patient comfort function goal is met;Evaluate patient's satisfaction with pain management progress       Problem: Discharge Barriers  Goal: Patient will be discharged home or other facility with appropriate resources  Outcome: Progressing   06/13/16 1138   Goal/Interventions addressed this shift   Discharge to home or other facility with appropriate resources Provide appropriate patient education;Initiate discharge planning;Provide information  on available health resources       Problem: Hemodynamic Status: Cardiac  Goal: Stable vital signs and fluid balance  Outcome: Progressing   06/13/16 1138   Goal/Interventions addressed this shift   Stable vital signs and fluid balance Monitor/assess vital signs and telemetry per unit protocol;Assess signs and symptoms associated with cardiac rhythm changes;Weigh on admission and record weight daily;Monitor intake/output per unit protocol and/or LIP order;Monitor lab values;Monitor for leg swelling/edema and report to LIP if abnormal       Problem: Moderate/High Fall Risk Score >5  Goal: Patient will remain free of falls  Outcome: Progressing   06/15/16 0800   OTHER   Moderate Risk (6-13) MOD-Initiate Yellow "Fall Risk" magnet communication tool;MOD-Consider activation of bed alarm if appropriate;MOD-Apply bed exit alarm if patient is confused;MOD-Floor mat at bedside (where available) if appropriate;MOD-Use of chair-pad alarm when appropriate;MOD-Consider a move closer to Nurses Station;MOD-Remain with patient during toileting;MOD-Use of assistive devices-bedside commode if appropriate;MOD-Request PT/OT consult order for patients with gait/mobility impairment;MOD-Utilize diversion activities;MOD-Re-orient confused patients;MOD-Include family in multidisciplinary POC discussions;MOD-Move patient to High risk if Heparin infusion, Coumadin, Lovenox >40mg /dose;MOD-Place Fall Risk level on whiteboard in room       Problem: Renal Instability  Goal: Fluid and electrolyte balance are achieved/maintained  Outcome: Progressing   06/14/16 1701   Goal/Interventions addressed this shift   Fluid and electrolyte balance are achieved/maintained  Monitor intake and output every shift;Monitor/assess lab values and report abnormal values;Monitor daily weight;Assess and reassess fluid and electrolyte status       Problem: Post-op Phase - Cardiac Surgery  Goal: Effective breathing pattern is maintained  Outcome: Progressing   06/14/16  1701   Goal/Interventions addressed this shift   Effective breathing pattern is maintained  Maintain SpO2 level per LIP order;Reinforce use of ordered respiratory interventions (i.e. CPAP,  BiPAP, Incentive Spirometer, Acapella)     Goal: Mobility/activity is maintained at optimal level  Outcome: Progressing   06/14/16 1701   Goal/Interventions addressed this shift   Mobility/activity is maintained at optimal level  Increase mobility as tolerated/progressive mobility;Get patient OOB to chair for meals;Encourage independent activity per ability. Reinforce sternal precautions.;Plan activities to conserve energy. Plan rest periods.     Goal: Nutritional intake is adequate  Outcome: Progressing   06/15/16 1448   Goal/Interventions addressed this shift   Nutritional intake is adequate Monitor daily weights;Assist patient with meals/food selection;Allow adequate time for meals;Encourage/perform oral hygiene as appropriate;Consult/collaborate with Clinical Nutritionist;Include patient/patient care companion in decisions related to nutrition;Assess anorexia, appetite, and amount of meal/food tolerated       Problem: Compromised Tissue integrity  Goal: Damaged tissue is healing and protected  Outcome: Progressing   06/14/16 1701   Goal/Interventions addressed this shift   Damaged tissue is healing and protected  Increase activity as tolerated/progressive mobility;Keep intact skin clean and dry;Use bath wipes, not soap and water, for daily bathing     Goal: Nutritional status is improving  Outcome: Progressing   06/15/16 1448   Goal/Interventions addressed this shift   Nutritional status is improving Assist patient with eating;Allow adequate time for meals;Include patient/patient care companion in decisions related to nutrition

## 2016-06-15 NOTE — PT Progress Note (Signed)
[facility]   Physical Therapy Treatment  Patient:  Jacqueline Weber MRN#:  [id number]  [facility]  Bed: FI204/FI204-01    Discharge Recommendations:   D/C Recommendations: Acute Rehab   DME Recommendations: DME Recommended for Discharge:  (pt has cane, walker)    If Acute Rehab recommended discharge disposition is not available, patient will need min assist for all mobility, equipment listed above, and HHPT.        Assessment:   Pt demonstrates improved ambulation distance, ambulating 40' this session, but continues to show decreased functional mobility, impaired gait, decreased endurance, and decreased balance. She requires min A for ambulation. Pt continues to benefit from skilled PT to increase functional mobility for improved safety.    Treatment Activities: Gait training, therapeutic exercises    Educated the patient to role of physical therapy, plan of care, goals of therapy and safety with mobility and ADLs.    Plan:   PT Frequency: 3-4x/wk    Continue plan of care.       Precautions and Contraindications:   Falls  Sternal  Speaks Laotian - interpretor [id number] used as needed    Updated Medical Status/Imaging/Labs:   Reviewed    Subjective:    Pt reports she feels OK.  Patient's medical condition is appropriate for Physical Therapy intervention at this time. Nursing clears pt for PT. Patient is agreeable to participation in the therapy session.    Pain:   Pt had no c/o pain throughout session.     Objective:   Patient is seated in a bedside chair with CVICU monitors and catheter in place.    Cognition  Pt is alert and oriented throughout session.   Follows commands without difficulty.     Functional Mobility  Sit to Stand: min A  Stand to Sit: min A    Ambulation  Level of Assistance required: min A  Ambulation Distance: 40'  Pattern: generally unsteady  Device Used for Ambulation: none  Weightbearing Status: no restrictions    Balance  Static Sitting: Good  Dynamic  Sitting: Fair+  Static Standing: Fair-  Dynamic Standing: Poor+    Therapeutic Exercises  B LAQ x10, B seated marches x10, B ankle pumps x10 with verbal cues for sequencing of all exercises. Pt instructed to continue as HEP.     Patient Participation: Good  Patient Endurance: Fair    Patient left with call bell within reach, all needs met, SCDs not in place, as when entered room, fall mat in place, and chair alarm in place and all questions answered. RN notified of session outcome and patient response.     Goals:  Goals  Time for Goal Acheivement: 5 visits  Pt Will Go Supine To Sit: with contact guard assist, to maximize functional mobility and independence  Pt Will Perform Sit to Stand: with supervision, to maximize functional mobility and independence  Pt Will Transfer Bed/Chair: with supervision, to maximize functional mobility and independence  Pt Will Ambulate: > 200 feet, with supervision, to maximize functional mobility and independence  Pt Will Go Up / Down Stairs: 3-5 stairs, with contact guard assist, to maximize functional mobility and independence      Louanna Raw, PT, DPT   Pager number: [phone]    Time of Treatment:  PT Received On: 06/15/16  Start Time: 1345  Stop Time: 1409  Time Calculation (min): 24 min  Treatment # 1 out of 5 visits

## 2016-06-15 NOTE — Progress Notes (Signed)
Cardiovascular ICU:  Date/Time: 06/15/16 9:23 AM  Patient Name: Jacqueline Weber, Jacqueline Weber  MRN #[id number]    Allergies:   Allergies   Allergen Reactions   . Mosquito (Culex Pipiens) Allergy Skin Test Shortness Of Breath     And  Trouble  breathing   . Lisinopril    . Shellfish-Derived Products Hives     New  allergy  New  allergy   . Penicillins Rash     Swelling,  numbness       Active Hospital Problems    Diagnosis   . NSTEMI (non-ST elevated myocardial infarction)     ?  Past Medical History:   Diagnosis Date   . Cataracts, bilateral    . Cerebrovascular accident    . Chronic kidney disease    . CVA (cerebral infarction) 03/2013   . Diabetes mellitus    . Hypercholesteremia    . Hypertension    . Myocardial infarction        Past Surgical History:   Procedure Laterality Date   . CORONARY ARTERY BYPASS N/A 06/12/2016    Procedure: Coronary Artery Bypass x 4.;  Surgeon: Bjorn Loser, MD;  Location: [facility] HEART OR;  Service: Cardiothoracic;  Laterality: N/A;  LIMA to LDA  Vein to PDA  Vein to OM  Vein to Diagonal   . ENDOSCOPIC,VEIN HARVEST N/A 06/12/2016    Procedure: Endoscopic, Left Leg Greater Saphenous Vein Harvest;  Surgeon: Bjorn Loser, MD;  Location: [facility] HEART OR;  Service: Cardiothoracic;  Laterality: N/A;  Left Leg Incision (medially, groin to ankle) @ 0742  Vein out of leg @ 0826  Vein prep complete @ 0845  Total time = 63 minutes       Prior to Admission medications    Medication Sig Start Date End Date Taking? Authorizing Provider   amLODIPine (NORVASC) 10 MG tablet Take 1 tablet (10 mg total) by mouth daily. 05/19/16   Stacey Drain, MD   aspirin EC 325 MG EC tablet Take 1 tablet (325 mg total) by mouth daily. 06/04/14   Clare Gandy, MD   carvedilol (COREG) 25 MG tablet Take 25 mg by mouth 2 (two) times daily with meals.       [provider]   cloNIDine (CATAPRES) 0.1 MG tablet Take 0.1 mg by mouth 2 (two) times daily.    [provider]   famotidine (PEPCID) 20 MG tablet Take 1  tablet (20 mg total) by mouth daily. 10/01/15   Sadie Haber, MD   hydrALAZINE (APRESOLINE) 100 MG tablet Take 1 tablet (100 mg total) by mouth 2 (two) times daily. 05/19/16   Stacey Drain, MD   losartan (COZAAR) 25 MG tablet Take 1 tablet (25 mg total) by mouth daily. 05/19/16   Stacey Drain, MD   simvastatin (ZOCOR) 10 MG tablet Take 10 mg by mouth nightly.    [provider]   SITagliptin (JANUVIA) 25 MG tablet Take 1 tablet (25 mg total) by mouth daily. 05/22/16   Stacie Glaze, NP   vitamin D, ergocalciferol, (DRISDOL) 50000 UNIT Cap Take 2,000 Units by mouth 2 (two) times daily.    [provider]         Note: Pt to Xray off monitor at 1440, verified ok per Adam PA      ASSESSMENT:    Neuro: Pt AAOx4, MAE, follows commands, pupils 2-32mm brisk, PERRL  ?  Respiratory: Pt on RA, sat >94. Clear breath sounds.  ?  CV: SR. +  2 pulses throughout.  ?  GI: Decreased appetite, active bowel sounds. No BM this shift.  ?  GU: Foley in place, lasix stopped. AUOP, pale yellow.    M/S: Up in chair this shift. Ambulated in room with PT. Ambulated to stretcher for  XRay. Up times 1 person assist.    Skin: Ecchymosis in suprapubic, R groin, R upper thigh, R abd area. Mid sternal incision open to air, well approximated. Chest incisions covered with guaze and cloth tape.    Psychosocial: Family visiting throughout shift. Pt primary language [place]. All questions and concerns addressed.     ISHAPED report given to Shelbie Hutching, RN at 1510, patient to transfer to 267      Signed by: Rica Records, RN  06/15/16 9:23 AM

## 2016-06-15 NOTE — OT Eval Note (Signed)
[facility]   Occupational Therapy Reassessment    Patient: Jacqueline Weber    MRN#: [id number]   [facility]  Bed: FI267/FI267-01                                     Discharge Recommendations:   Discharge Recommendation: Acute Rehab   DME Recommended for Discharge:  (TBA @ rehab)    If Acute Rehab recommended discharge disposition is not available, patient will need CGA-Min A for ADLs and mobility, a shower chair and home health therapies.    Assessment:   Ceclia Weber is a 64 y.o. female admitted 06/08/2016 with NSTEMI with s/p heart cath showing MVD. Pt is now s/p CABG x 4. Pt educated on sternal precautions and safety with verbalized understanding. Pt presents with decreased activity tolerance, generalized weakness, increased pain and decreased independence with ADLs and mobility. Pt would benefit from continued acute OT services to maximize functional independence.     Impairments: Assessment: decreased strength;balance deficits;decreased independence with ADLs;decreased independence with IADLs;decreased endurance/activity tolerance    Therapy Diagnosis: Decreased independence with ADLs and mobility    Rehabilitation Potential: Prognosis: Good;With continued OT s/p acute discharge     Treatment Activities: OT evaluation, sternal precautions    Educated the patient to role of occupational therapy, plan of care, goals of therapy and safety with mobility and ADLs, sternal precautions.    Plan:   OT Frequency Recommended: 3-4x/wk     Treatment Interventions: ADL retraining;Functional transfer training;UE strengthening/ROM;Endurance training;Cognitive reorientation;Patient/Family training;Equipment eval/education;Compensatory technique education     Risks/benefits/POC discussed with patient       Reason for reassessment:  S/p CABG x 4    Precautions and Contraindications:   Precautions  Weight Bearing Status: no restrictions  Sternal Precautions: no raising elbows higher than  shoulders, no reaching behind back, no lifting, no pushing, no pulling  Other Precautions: Falls    Consult received for [name] for OT Evaluation and Treatment.  Patient's medical condition is appropriate for Occupational Therapy intervention at this time.    History of Present Illness:   Updated History of Present Illness: Jacqueline Weber is a 65 y.o. female for CABG evaluation. Pt is now s/p CABG x 4.    Admitting Diagnosis: CAD (coronary artery disease) [I25.10]  NSTEMI (non-ST elevated myocardial infarction) [I21.4]    Imaging/Tests/Labs:  Xr Chest 2 Views  Result Date: 06/15/2016  1. Cardiomegaly. 2. Small bilateral effusions. 3. Bibasilar atelectasis.     X-ray Chest Ap Portable (daily)  Result Date: 06/14/2016   Bibasilar atelectasis and/or patchy consolidation and small bilateral pleural effusions. No pneumothorax identified.     X-ray Chest Ap Portable (daily)  Result Date: 06/13/2016   1. Increasing basilar atelectasis and decreased lung volumes following extubation. 2. Negative for pneumothorax.     X-ray Chest Ap Portable (once)  Result Date: 06/12/2016   Low lung volumes with minimal atelectasis in intubated patient status post sternotomy.     Xr Chest Ap Portable  Result Date: 06/09/2016   No acute process.     US Venous Low Extrem Duplex [employer] Bilat  Result Date: 06/10/2016  1. Widely patent bilateral great and small saphenous veins, no evidence of superficial thrombosis. Mild diffuse wall thickening of the small saphenous veins bilaterally. 2. Vein measurements as above.       Subjective: "My hand hurts."  Patient is agreeable to participation in the therapy session. Nursing clears patient for therapy.     Pain: 5/10  Location: right hand  Intervention: nsg in and aware, positioned for comfort    Pt goal:  To get better      Objective:        Observation of Patient/Vital Signs:  Patient is seated in a bedside chair with CVIC monitors, foley, peripheral IV in place.    Assisted transfer of  patient to CVSD.    Cognitive Status and Neuro Exam:  Cognition/Neuro Status  Arousal/Alertness: Appropriate responses to stimuli  Attention Span: Appears intact  Orientation Level: Oriented X4  Memory: Appears intact  Following Commands: independent  Insights: Fully aware of deficits       Musculoskeletal Examination  Gross ROM  Right Upper Extremity ROM: within functional limits (within sternal precautions)  Left Upper Extremity ROM: within functional limits (within sternal precautions)  Right Lower Extremity ROM: within functional limits  Left Lower Extremity ROM: within functional limits    Gross Strength  Neck/Trunk Strength: 4/5  Right Upper Extremity Strength: 4/5  Left Upper Extremity Strength: 4/5  Right Lower Extremity Strength: 4/5         Sensory/Oculomotor Examination  Sensory  Auditory: intact  Tactile - Light Touch: intact  Visual Acuity: intact       Activities of Daily Living  Self-care and Home Management  Eating: Setup  Grooming: Minimal Assist  Bathing: nt  UB Dressing: Minimal Assist  LB Dressing: Minimal Assist  Toileting: foley  Functional Transfers: Contact Guard Assist    Functional Mobility:  Mobility and Transfers  Sit to Stand: Armed forces training and education officer Mobility/Ambulation: Nurse, learning disability forward/backward steps at Therapist, nutritional Sitting Balance: Supervision  Dyanamic Sitting Balance: Doctor, hospital Standing Balance: Contact Guard Assist  Dynamic Standing Balance: Minimal Assist    Participation and Activity Tolerance  Participation and Endurance  Participation Effort: good  Endurance: Tolerates 10 - 20 min exercise with multiple rests    Patient left with call bell within reach, all needs met, SCDs off as found, fall mat in place, chair alarm activated and all questions answered. RN notified of session outcome and patient response.       Goals:     ADL Goals  Patient will groom self: Supervision, at sinkside  Patient will dress upper body:  Supervision  Patient will dress lower body: Supervision  Patient will toilet: Supervision  Mobility and Transfer Goals  Pt will transfer bed to toilet: Supervision        Executive Fucntion Goals  Pt will follow sternal precautions: Recall 3/3, demonstration, to increase ability to complete ADLs                Salli Quarry OTR/L  Pager # [phone]       Time of treatment:   OT Received On: 06/15/16  Start Time: 1530  Stop Time: 1610  Time Calculation (min): 40 min

## 2016-06-15 NOTE — Progress Notes (Signed)
Pt transferred off unit with CVSD RN's x2     06/15/16 1535   Vitals   Heart Rate 71   Pulse (SpO2) 73   Resp Rate (!) 23   Oxygen Therapy   SpO2 96 %

## 2016-06-15 NOTE — Plan of Care (Signed)
Problem: Safety  Goal: Patient will be free from injury during hospitalization  Outcome: Progressing    Goal: Patient will be free from infection during hospitalization  Outcome: Progressing      Problem: Pain  Goal: Pain at adequate level as identified by patient  Outcome: Progressing      Problem: Discharge Barriers  Goal: Patient will be discharged home or other facility with appropriate resources  Outcome: Progressing      Problem: Hemodynamic Status: Cardiac  Goal: Stable vital signs and fluid balance  Outcome: Progressing      Problem: Moderate/High Fall Risk Score >5  Goal: Patient will remain free of falls  Outcome: Progressing      Problem: Renal Instability  Goal: Fluid and electrolyte balance are achieved/maintained  Outcome: Progressing      Problem: Post-op Phase - Cardiac Surgery  Goal: Effective breathing pattern is maintained  Outcome: Adequate for Discharge    Goal: Mobility/activity is maintained at optimal level  Outcome: Progressing    Goal: Nutritional intake is adequate  Outcome: Progressing      Problem: Compromised Tissue integrity  Goal: Damaged tissue is healing and protected  Outcome: Progressing    Goal: Nutritional status is improving  Outcome: Progressing      Comments: Pt is A&Ox4, [place] speaking only, interpreter phone in use at bedside. SR on tele in 50s. SBP 130s-140s. SpO2 WNL on RA. OOB x1 assist, ambulating in room. Midsternal and L leg incisions OTA, heat applied to L leg. Safety precautions maintained. Call bell and bedside table in reach. Will continue to monitor.

## 2016-06-15 NOTE — Progress Notes (Signed)
[facility].  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  - HTN with CKD; uncontrolled  - Volume overload; much improved   - Metabolic acidosis, non-AG  - Anemia CKD  - Hyper-phosphatemia    Plan:    -[place] diuretics; give a diuretics holiday   -supportive care   -No immediate need for RRT but may need it soon unless her renal function improves  -ESA  -continue to document UOP    Linde Gillis, MD  Office - [phone]  Spectra Link - [phone]  ++++++++++++++++++++++++++++++++++++++++++++++++++++++++++++++  Subjective:    -stable;     Medications:  Scheduled Meds:  Current Facility-Administered Medications   Medication Dose Route Frequency   . amiodarone  400 mg Oral Q12H [facility]   . amLODIPine  10 mg Oral Daily   . aspirin EC  325 mg Oral Daily   . carvedilol  12.5 mg Oral Q12H [facility]   . chlorhexidine  15 mL Mouth/Throat Q12H SCH   . cloNIDine  0.1 mg Oral Q12H [facility]   . darbepoetin alfa  60 mcg Subcutaneous Weekly   . famotidine  20 mg Oral Daily   . heparin (porcine)  5,000 Units Subcutaneous Q12H [facility]   . hydrALAZINE  100 mg Oral Q8H [place]   . insulin glargine  10 Units Subcutaneous QAM   . mupirocin   Nasal Q12H SCH   . polyethylene glycol  17 g Oral Daily   . senna-docusate  2 tablet Oral Q12H   . simvastatin  20 mg Oral QHS   . SITagliptin  25 mg Oral Daily   . sodium bicarbonate  650 mg Oral BID     Continuous Infusions:  . furosemide (LASIX) infusion 10 mg/hr (06/15/16 0800)     PRN Meds:[COMPLETED] acetaminophen **FOLLOWED BY** acetaminophen, bisacodyl, Nursing communication: Adult Hypoglycemia Treatment Algorithm **AND** dextrose **AND** dextrose **AND** glucagon (rDNA) **AND** Nursing communication: Document Significant Event Note, HYDROmorphone, insulin lispro, insulin lispro, lactated ringers, ondansetron **OR** ondansetron, simethicone,  traMADol    Objective:  Vital signs in last 24 hours:  Temp:  [97.8 F (36.6 C)-98.7 F (37.1 C)] 98.2 F (36.8 C)  Heart Rate:  [63-117] 69  Resp Rate:  [14-30] 26  BP: (108-197)/(59-118) 147/69  Arterial Line BP: (188)/(78) 188/78  Intake/Output last 24 hours:    Intake/Output Summary (Last 24 hours) at 06/15/16 0913  Last data filed at 06/15/16 0800   Gross per 24 hour   Intake          1503.25 ml   Output             2725 ml   Net         -1221.75 ml     Intake/Output this shift:  I/O this shift:  In: 268 [P.O.:250; I.V.:18]  Out: 150 [Urine:150]    Physical Exam:   Gen: WD WN NAD   CV: S1 S2 N RRR   Chest: CTAB   Ab: ND NT soft   Ext: No C/E    Labs:    Recent Labs  Lab 06/15/16  0153 06/14/16  1932 06/14/16  0108 06/13/16  2059 06/13/16  0817 06/13/16  0207   Glucose 178*  --  142* 141*  --  120*   BUN 48.0*  --  34.0* 31.0*  --  22.0*   Creatinine 3.2*  --  3.0* 2.8*  --  2.3*   Calcium 8.0*  --  8.1* 8.0*  --  8.2*   Sodium 133*  --  138 138  --  139   Potassium 4.7 4.7 4.7 4.5  --  5.0   Chloride 102  --  107 108  --  112*   CO2 23  --  17* 17*  --  18*   Albumin  --   --   --   --  3.0*  --    Phosphorus 6.6*  --  7.3*  --   --  4.5   Magnesium 2.5 2.7* 2.7*  --   --  3.0*       Recent Labs  Lab 06/15/16  0152 06/14/16  0108 06/13/16  0207   WBC 12.19* 13.56* 13.68*   Hgb 9.4* 9.1* 10.3*   Hematocrit 28.9* 28.5* 32.1*   MCV 73.0* 74.2* 74.5*   MCH 23.7* 23.7* 23.9*   MCHC 32.5 31.9* 32.1   RDW 21* 21* 20*   MPV 10.8 11.1 10.9   Platelets 190 156 172

## 2016-06-16 LAB — BASIC METABOLIC PANEL
BUN: 54 mg/dL — ABNORMAL HIGH (ref 7.0–19.0)
CO2: 21 mEq/L — ABNORMAL LOW (ref 22–29)
Calcium: 8 mg/dL — ABNORMAL LOW (ref 8.5–10.5)
Chloride: 101 mEq/L (ref 100–111)
Creatinine: 3.1 mg/dL — ABNORMAL HIGH (ref 0.6–1.0)
Glucose: 122 mg/dL — ABNORMAL HIGH (ref 70–100)
Potassium: 4.3 mEq/L (ref 3.5–5.1)
Sodium: 131 mEq/L — ABNORMAL LOW (ref 136–145)

## 2016-06-16 LAB — GFR: EGFR: 15.1

## 2016-06-16 LAB — PHOSPHORUS: Phosphorus: 4.6 mg/dL (ref 2.3–4.7)

## 2016-06-16 LAB — GLUCOSE WHOLE BLOOD - POCT
Whole Blood Glucose POCT: 181 mg/dL — ABNORMAL HIGH (ref 70–100)
Whole Blood Glucose POCT: 160 mg/dL — ABNORMAL HIGH (ref 70–100)
Whole Blood Glucose POCT: 220 mg/dL — ABNORMAL HIGH (ref 70–100)

## 2016-06-16 LAB — MAGNESIUM: Magnesium: 2.1 mg/dL (ref 1.6–2.6)

## 2016-06-16 LAB — WHOLE BLOOD GLUCOSE POCT: Whole Blood Glucose POCT: 140 mg/dL — ABNORMAL HIGH (ref 70–100)

## 2016-06-16 MED ORDER — GLUCAGON 1 MG IJ SOLR (WRAP)
1.0000 mg | INTRAMUSCULAR | Status: DC | PRN
Start: 2016-06-16 — End: 2016-06-19

## 2016-06-16 MED ORDER — GLUCOSE 40 % PO GEL
15.0000 g | ORAL | Status: DC | PRN
Start: 2016-06-16 — End: 2016-06-19

## 2016-06-16 MED ORDER — INSULIN LISPRO 100 UNIT/ML SC SOLN
1.0000 [IU] | Freq: Three times a day (TID) | SUBCUTANEOUS | Status: DC | PRN
Start: 2016-06-16 — End: 2016-06-17
  Administered 2016-06-16 – 2016-06-17 (×3): 1 [IU] via SUBCUTANEOUS
  Filled 2016-06-16: qty 3

## 2016-06-16 MED ORDER — CARVEDILOL 6.25 MG PO TABS
25.0000 mg | ORAL_TABLET | Freq: Two times a day (BID) | ORAL | Status: DC
Start: 2016-06-16 — End: 2016-06-19
  Administered 2016-06-16 – 2016-06-19 (×6): 25 mg via ORAL
  Filled 2016-06-16 (×6): qty 4

## 2016-06-16 MED ORDER — INSULIN LISPRO 100 UNIT/ML SC SOLN
1.0000 [IU] | Freq: Every evening | SUBCUTANEOUS | Status: DC | PRN
Start: 2016-06-16 — End: 2016-06-17
  Administered 2016-06-16: 1 [IU] via SUBCUTANEOUS
  Filled 2016-06-16: qty 3

## 2016-06-16 MED ORDER — CLONIDINE HCL 0.1 MG PO TABS
0.1000 mg | ORAL_TABLET | Freq: Three times a day (TID) | ORAL | Status: DC
Start: 2016-06-16 — End: 2016-06-17
  Administered 2016-06-16 – 2016-06-17 (×2): 0.1 mg via ORAL
  Filled 2016-06-16 (×2): qty 1

## 2016-06-16 MED ORDER — DEXTROSE 10 % IV BOLUS
125.0000 mL | INTRAVENOUS | Status: DC | PRN
Start: 2016-06-16 — End: 2016-06-19

## 2016-06-16 MED ORDER — HYDRALAZINE HCL 20 MG/ML IJ SOLN
10.0000 mg | INTRAMUSCULAR | Status: DC | PRN
Start: 2016-06-16 — End: 2016-06-19
  Administered 2016-06-17: 10 mg via INTRAVENOUS
  Filled 2016-06-16: qty 1

## 2016-06-16 NOTE — Progress Notes (Signed)
[facility] Inpatient Rehab Note:  Following patient for acute inpatient rehab at [facility]   Met with patient to discuss acute rehab and verify post rehab discharge plan.  Pt awaiting daughter to decide on acute rehab.  Willing to accept patient pending medical clearance, rehab appropriateness, and bed availability.     Roanna Raider, BSN, RN-BC, CRRN  [phone]  [facility] Admissions Liaison  [phone]

## 2016-06-16 NOTE — OT Progress Note (Signed)
Occupational Therapy Note    [facility]   Occupational Therapy Treatment     Patient: Jacqueline Weber    MRN#: [id number]   [facility]  Bed: FI267/FI267-01      Discharge Recommendations:   Discharge Recommendation: Acute Rehab   DME Recommended for Discharge: Shower chair    If Acute Rehab recommended discharge disposition is not available, patient will need CGA-Min A for functional mobility and ADLs, a shower chair and home health therapies.     Assessment:   Pt received supine in bed with daughter in room - agreeable to OT session. Reviewed sternal precautions handout with pt/family - addressed questions. Pt educated on log roll technique for supine to sit - required Min A. Min A functional mobility - minimal distance in room and in hallway. Pt continues to be limited by decreased activity tolerance, generalized weakness and impaired balance. Pt would benefit from continued acute OT services to maximize functional independence.     Treatment Activities: log roll, bed mobility, transfer training, sternal precautions, safety, walking progression, functional mobility    Educated the patient to role of occupational therapy, plan of care, goals of therapy and safety with mobility and ADLs, energy conservation techniques, pursed lip breathing, sternal precautions, discharge instructions, home safety.    Plan:    OT Frequency Recommended: 3-4x/wk     Continue plan of care.       Precautions and Contraindications:   Falls  [place] speaking - family in to assist with translation    Updated Medical Status/Imaging/Labs:  Xr Chest 2 Views  Result Date: 06/15/2016  1. Cardiomegaly. 2. Small bilateral effusions. 3. Bibasilar atelectasis.       Subjective: "I am a little dizzy."   Patient's medical condition is appropriate for Occupational Therapy intervention at this time.  Patient is agreeable to participation in the therapy session. Nursing clears patient for therapy.    Pain:  No/Denies      Objective:   Patient is in bed with telemetry, foley and peripheral IV in place.    Vitals stable throughout session    Cognition  Alert - intact  Follows directions via interpretation    Functional Mobility  Rolling:  SBA - CGA, cues for log roll  Supine to Sit: Min A - educated on log roll technique and sternal precautions  Sit to Stand: CGA  Transfers: CGA - Min A - unsteady    Balance  Static Sitting: S - SBA  Dynamic Sitting: SBA- CGA  Static Standing: CGA  Dynamic Standing: Min A    Self Care and Home Management  Eating: ind hand to mouth  Grooming: set up  Bathing: nt  UE Dressing: reviewed sternal precautions handout  LE Dressing: pt able to donn right sock at EOB, Total A to donn left sock  Toileting: foley    Therapeutic Exercises  Incorporated into session  Educated pt/family on walking progression    Participation: good  Endurance: fair    Patient left with call bell within reach, all needs met, SCDs off as found, fall mat in place, chair alarm activated and all questions answered. RN notified of session outcome and patient response.     Goals:     ADL Goals  Patient will groom self: Supervision, at sinkside  Patient will dress upper body: Supervision  Patient will dress lower body: Supervision  Patient will toilet: Supervision  Mobility and Transfer Goals  Pt will transfer bed to toilet:  Supervision        Executive Fucntion Goals  Pt will follow sternal precautions: Recall 3/3, demonstration, to increase ability to complete ADLs                Salli Quarry OTR/L  Pager # [phone]    Time of Treatment  OT Received On: 06/16/16  Start Time: 6742  Stop Time: 1120  Time Calculation (min): 27 min    Treatment # 1 of 5

## 2016-06-16 NOTE — Progress Notes (Signed)
CV SURGERY ICU PROGRESS NOTE      POD: 4    Surgery:  Procedure(s):  1.  Coronary Artery Bypass x 4.  2.  Endoscopic, Left Leg Greater Saphenous Vein Harvest      EF: 70%  Surgeon: Bjorn Loser, MD      Assessment/Plan:   Active Problems:    NSTEMI (non-ST elevated myocardial infarction)      Neurological/?:    Intact   Hx CVA in past, no deficits    PT/OT:  recs acute rehab    Cardiovascular:   CAD s/p CABG   SR- 60s, continue on Amiodarone   Hypertension: allowing permissive HTN for renal pefusion, SBP <150. Clonidine, Coreg and Norvasc. 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    Hematological:   Acute on Chronic Anemia: Hematocrit: 28 Platelets: 190, on aranesp weekly.   DVT prophylaxis: SCDs and subq Heparin    Infectious Disease:   Afebrile;   WBC: 12   No S/Sx of infection   Lines: PIVs    Endocrinological:   DM II, insulin GTT d/c'd    On SSI, will watch insulin requirements, for 24 hours and consult endocrine if necessary   On Januvia, renal dosed.   HgbA1c: 6.3    Plan:   Monitor blood pressure, allowing SBP 150s for renal perfusion   Titrate up on po meds as allowed   F/u with nephrology regarding need for dialysis   Consult endocrine for worsening hyperglycemia      Subjective:   Interval History: No issues  overnight. Denies any specific complaint.       Objective:   Vital signs for last 24 hours:  Temp:  [98 F (36.7 C)-98.8 F (37.1 C)] 98.2 F (36.8 C)  Heart Rate:  [65-75] 69  Resp Rate:  [16-23] 16  BP: (135-179)/(65-79) 144/68    Neuro: Intact, moves all  Cardiac: RRR no murmur or rub  Lungs: Decreased bilat, but clear  Abdomen: soft NT ND BS hypoactive  Extremities: warm pulse intact 1+ edema LE  Wounds: C/D/I        Medications:   Scheduled Meds:  Current Facility-Administered Medications   Medication Dose Route Frequency   . amiodarone  400 mg Oral Q12H [facility]   . amLODIPine  10 mg Oral Daily   . aspirin EC  325 mg Oral Daily   . carvedilol  25 mg Oral Q12H [facility]   . cloNIDine  0.1 mg Oral Q12H [facility]   . darbepoetin alfa  60 mcg Subcutaneous Weekly   . famotidine  20 mg Oral Daily   . heparin (porcine)  5,000 Units Subcutaneous Q12H [facility]   . hydrALAZINE  100 mg Oral Q8H [place]   . polyethylene glycol  17 g Oral Daily   . senna-docusate  1 tablet Oral BID   . simvastatin  20 mg Oral QHS   . SITagliptin  25 mg Oral Daily     Continuous Infusions:         Labs:   CBC:   Recent Labs      06/15/16   0152   WBC  12.19*   Hgb  9.4*   Hematocrit  28.9*   Platelets  190     BMP:   Recent Labs      06/16/16   0424   Sodium  131*   Potassium  4.3   Chloride  101   CO2  21*   BUN  54.0*   Creatinine  3.1*   Glucose  122*   Magnesium  2.1   Calcium  8.0*   Phosphorus  4.6     LFTs:   No results for input(s): AST, ALT, ALKPHOS, BILITOTAL, BILIDIRECT, BILIINDIRECT, PROT, ALB in the last 72 hours.  INR:   No results for input(s): PT, INR, APTT in the last 72 hours.  ABG:   Recent Labs      06/13/16   2331   pH, Arterial  7.320*   pO2, Arterial  122.0*   pCO2, Arterial  42.2   HCO3, Arterial  21.2*   Base Excess, Arterial  -4.1*         Microbiology (past 7 days):         Radiology (past 7 days):   Chest xray: see above      Ave Filter, NP  Cardiovascular Surgery

## 2016-06-16 NOTE — Plan of Care (Addendum)
Problem: Safety  Goal: Patient will be free from injury during hospitalization  Outcome: Progressing    Goal: Patient will be free from infection during hospitalization  Outcome: Progressing      Problem: Pain  Goal: Pain at adequate level as identified by patient  Outcome: Progressing      Problem: Discharge Barriers  Goal: Patient will be discharged home or other facility with appropriate resources  Outcome: Progressing      Problem: Hemodynamic Status: Cardiac  Goal: Stable vital signs and fluid balance  Outcome: Not Progressing      Problem: Moderate/High Fall Risk Score >5  Goal: Patient will remain free of falls  Outcome: Progressing      Problem: Renal Instability  Goal: Fluid and electrolyte balance are achieved/maintained  Outcome: Progressing      Problem: Post-op Phase - Cardiac Surgery  Goal: Effective breathing pattern is maintained  Outcome: Adequate for Discharge    Goal: Mobility/activity is maintained at optimal level  Outcome: Progressing    Goal: Nutritional intake is adequate  Outcome: Progressing      Problem: Compromised Tissue integrity  Goal: Damaged tissue is healing and protected  Outcome: Progressing    Goal: Nutritional status is improving  Outcome: Progressing    Pt is A&Ox4. SR on tele. SpO2 WNL on RA. SBP up to 160s, clonidine given early. OOB x1 assist, ambulating in hallway. Foley in place. Safety precautions maintained. Call bell and bedside table in reach. Will continue to monitor.

## 2016-06-16 NOTE — Plan of Care (Signed)
Problem: Safety  Goal: Patient will be free from injury during hospitalization  Outcome: Progressing      Problem: Pain  Goal: Pain at adequate level as identified by patient  Outcome: Progressing      Problem: Moderate/High Fall Risk Score >5  Goal: Patient will remain free of falls  Outcome: Progressing      Problem: Compromised Tissue integrity  Goal: Damaged tissue is healing and protected  Outcome: Progressing    Patient A&Ox3, mainly [place] speaking, interpreter phone at the bedside as well as daughter. Monitor shows normal sinus rhythm. On RA sating 93%. Foley catheter in place draining clear, yellow urine, foley care completed per protocol. OOB with assist, patient ambulates in the room. Incisions OTA, CDI. Patient c/o mild incisional pain to left leg, heating pad applied with some relief. Bed in lowest position, call bell within reach. Will continue to monitor and report any changes.

## 2016-06-16 NOTE — Progress Notes (Signed)
[facility].  Progress Note      Assessment:    - CKD3B/4 as baseline  - AKI, non-oliguric ATN, stabilizing, non oliguric   - CABG x 4 on 10/9  - HTN with CKD  - Volume overload; much improved   - Metabolic acidosis, improved  - Anemia CKD, on aranesp  - Hyper-phosphatemia  -DM    Plan:    -can d/c foley from my standpoint  -f/u w Dr. Kate Sable, in our office in 7-10 days      Jodene Nam, MD  Office - [phone]  Spectra Link - n/a    ++++++++++++++++++++++++++++++++++++++++++++++++++++++++++++++  Subjective:  No new complaints    Medications:  Scheduled Meds:  Current Facility-Administered Medications   Medication Dose Route Frequency   . amiodarone  400 mg Oral Q12H [facility]   . amLODIPine  10 mg Oral Daily   . aspirin EC  325 mg Oral Daily   . carvedilol  25 mg Oral Q12H [facility]   . cloNIDine  0.1 mg Oral Q12H [facility]   . darbepoetin alfa  60 mcg Subcutaneous Weekly   . famotidine  20 mg Oral Daily   . heparin (porcine)  5,000 Units Subcutaneous Q12H [facility]   . hydrALAZINE  100 mg Oral Q8H [facility]   . polyethylene glycol  17 g Oral Daily   . senna-docusate  1 tablet Oral BID   . simvastatin  20 mg Oral QHS   . SITagliptin  25 mg Oral Daily     Continuous Infusions:   PRN Meds:acetaminophen, hydrALAZINE, ondansetron, traMADol    Objective:  Vital signs in last 24 hours:  Temp:  [98 F (36.7 C)-98.8 F (37.1 C)] 98.4 F (36.9 C)  Heart Rate:  [63-75] 67  Resp Rate:  [15-23] 18  BP: (129-179)/(60-79) 148/70  Intake/Output last 24 hours:    Intake/Output Summary (Last 24 hours) at 06/16/16 1006  Last data filed at 06/16/16 0600   Gross per 24 hour   Intake              970 ml   Output             2115 ml   Net            -1145 ml     Intake/Output this shift:  No intake/output data recorded.    Physical Exam:   Gen: alert, well developed, well nourished, No distress   Cardiac:  S1 S2,  no rub, no murmer, wound dry   Chest: Clear, no crackles, no wheezes   Abdomen: non distended, soft, non  tender, no organomegaly, has foley   Ext: tr edema                    Labs:    Recent Labs  Lab 06/16/16  0424 06/15/16  0153 06/14/16  1932 06/14/16  0108  06/13/16  0817   Glucose 122* 178*  --  142* More results in Results Review  --    BUN 54.0* 48.0*  --  34.0* More results in Results Review  --    Creatinine 3.1* 3.2*  --  3.0* More results in Results Review  --    Calcium 8.0* 8.0*  --  8.1* More results in Results Review  --    Sodium 131* 133*  --  138 More results in Results Review  --    Potassium 4.3 4.7 4.7 4.7 More results in Results  Review  --    Chloride 101 102  --  107 More results in Results Review  --    CO2 21* 23  --  17* More results in Results Review  --    Albumin  --   --   --   --   --  3.0*   Phosphorus 4.6 6.6*  --  7.3*  --   --    Magnesium 2.1 2.5 2.7* 2.7*  --   --    More results in Results Review = values in this interval not displayed.    Recent Labs  Lab 06/15/16  0152 06/14/16  0108 06/13/16  0207   WBC 12.19* 13.56* 13.68*   Hgb 9.4* 9.1* 10.3*   Hematocrit 28.9* 28.5* 32.1*   MCV 73.0* 74.2* 74.5*   MCH 23.7* 23.7* 23.9*   MCHC 32.5 31.9* 32.1   RDW 21* 21* 20*   MPV 10.8 11.1 10.9   Platelets 190 156 172

## 2016-06-17 LAB — CBC
Absolute NRBC: 0.02 10*3/uL — ABNORMAL HIGH
Hematocrit: 32.3 % — ABNORMAL LOW (ref 37.0–47.0)
Hgb: 10.3 g/dL — ABNORMAL LOW (ref 12.0–16.0)
MCH: 23 pg — ABNORMAL LOW (ref 28.0–32.0)
MCHC: 31.9 g/dL — ABNORMAL LOW (ref 32.0–36.0)
MCV: 72.3 fL — ABNORMAL LOW (ref 80.0–100.0)
MPV: 10.4 fL (ref 9.4–12.3)
Nucleated RBC: 0.3 /100 WBC (ref 0.0–1.0)
Platelets: 225 10*3/uL (ref 140–400)
RBC: 4.47 10*6/uL (ref 4.20–5.40)
RDW: 21 % — ABNORMAL HIGH (ref 12–15)
WBC: 7.78 10*3/uL (ref 3.50–10.80)

## 2016-06-17 LAB — BASIC METABOLIC PANEL
BUN: 53 mg/dL — ABNORMAL HIGH (ref 7.0–19.0)
CO2: 24 mEq/L (ref 22–29)
Calcium: 7.8 mg/dL — ABNORMAL LOW (ref 8.5–10.5)
Chloride: 104 mEq/L (ref 100–111)
Creatinine: 2.6 mg/dL — ABNORMAL HIGH (ref 0.6–1.0)
Glucose: 130 mg/dL — ABNORMAL HIGH (ref 70–100)
Potassium: 4.7 mEq/L (ref 3.5–5.1)
Sodium: 132 mEq/L — ABNORMAL LOW (ref 136–145)

## 2016-06-17 LAB — GLUCOSE WHOLE BLOOD - POCT
Whole Blood Glucose POCT: 136 mg/dL — ABNORMAL HIGH (ref 70–100)
Whole Blood Glucose POCT: 182 mg/dL — ABNORMAL HIGH (ref 70–100)
Whole Blood Glucose POCT: 149 mg/dL — ABNORMAL HIGH (ref 70–100)
Whole Blood Glucose POCT: 157 mg/dL — ABNORMAL HIGH (ref 70–100)

## 2016-06-17 LAB — PHOSPHORUS: Phosphorus: 3.4 mg/dL (ref 2.3–4.7)

## 2016-06-17 LAB — GFR: EGFR: 18.5

## 2016-06-17 LAB — MAGNESIUM: Magnesium: 2.1 mg/dL (ref 1.6–2.6)

## 2016-06-17 MED ORDER — INSULIN LISPRO 100 UNIT/ML SC SOLN
1.0000 [IU] | Freq: Every evening | SUBCUTANEOUS | Status: DC | PRN
Start: 2016-06-17 — End: 2016-06-19

## 2016-06-17 MED ORDER — CLONIDINE HCL 0.2 MG PO TABS
0.2000 mg | ORAL_TABLET | Freq: Three times a day (TID) | ORAL | Status: DC
Start: 2016-06-17 — End: 2016-06-19
  Administered 2016-06-17 – 2016-06-19 (×7): 0.2 mg via ORAL
  Filled 2016-06-17 (×7): qty 1

## 2016-06-17 MED ORDER — AMIODARONE HCL 200 MG PO TABS
200.0000 mg | ORAL_TABLET | Freq: Two times a day (BID) | ORAL | Status: DC
Start: 2016-06-17 — End: 2016-06-19
  Administered 2016-06-17 – 2016-06-19 (×4): 200 mg via ORAL
  Filled 2016-06-17 (×4): qty 1

## 2016-06-17 MED ORDER — INSULIN LISPRO 100 UNIT/ML SC SOLN
1.0000 [IU] | Freq: Three times a day (TID) | SUBCUTANEOUS | Status: DC | PRN
Start: 2016-06-17 — End: 2016-06-19
  Administered 2016-06-18: 1 [IU] via SUBCUTANEOUS
  Filled 2016-06-17 (×2): qty 3

## 2016-06-17 NOTE — PT Progress Note (Signed)
[facility]   Physical Therapy Treatment  Patient:  Jacqueline Weber MRN#:  [id number]  [facility]  Bed: FI267/FI267-01    Discharge Recommendations:   D/C Recommendations: Home with home health PT, Home with supervision   DME Recommendations: DME Recommended for Discharge:  (none)    If Home with home health PT, Home with supervision recommended discharge disposition is not available, patient will need acute rehab.      Assessment:   Pt demonstrates improved ambulation, requiring CGA this session, but continues to show decreased functional mobility, decreased endurance, and decreased balance. She continues to require verbal and tactile cues for sternal precautions. Pt continues to benefit from skilled PT to increase functional mobility for improved safety.    Treatment Activities: Gait training    Educated the patient to role of physical therapy, plan of care, goals of therapy and safety with mobility and ADLs, sternal precautions.    Plan:   PT Frequency: 2-3x/wk    Continue plan of care.       Precautions and Contraindications:   Falls  Sternal  Speaks [place] - family interprets as needed    Updated Medical Status/Imaging/Labs:   Reviewed    Subjective:    Pt reports she wasn't dizzy.  Patient's medical condition is appropriate for Physical Therapy intervention at this time. Nursing clears pt for PT. Patient is agreeable to participation in the therapy session.    Pain:   Pt had no c/o pain throughout session.     Objective:   Patient is seated in a bedside chair with telemetry and catheter in place.    Cognition  Pt is alert and oriented throughout session.   Follows commands without difficulty.     Functional Mobility  Sit to Stand: CGA  Stand to Sit: CGA    Ambulation  Level of Assistance required: CGA  Ambulation Distance: 300'  Pattern: generally unsteady at end of ambulation with dyspnea on exertion but no LOB  Device Used for Ambulation: none  Weightbearing Status: no  restrictions  Stair Management: CGA  Number of Stairs: 7  Device Used for Stairs: one hand on railing (educated on using for balance but not WB)  Pattern on Stairs: no LOB    Balance  Static Sitting: Good  Dynamic Sitting: Good  Static Standing: Fair+  Dynamic Standing: Fair+    Therapeutic Exercises  B LAQ x10, B seated marches x10, B ankle pumps x10 with verbal cues for sequencing of all exercises. Pt instructed to continue as HEP.     Patient Participation: Good  Patient Endurance: Fair    Vitals  Prior to activity: bp 142/67, SpO2 97%  After ambulation: bp 162/76    Patient left with call bell within reach, all needs met, SCDs not in place, as when entered room, fall mat in place, and chair alarm in place and all questions answered. RN notified of session outcome and patient response.     Goals:  Goals  Time for Goal Acheivement: 5 visits  Pt Will Go Supine To Sit: with contact guard assist, to maximize functional mobility and independence  Pt Will Perform Sit to Stand: with supervision, to maximize functional mobility and independence  Pt Will Transfer Bed/Chair: with supervision, to maximize functional mobility and independence  Pt Will Ambulate: > 200 feet, with supervision, to maximize functional mobility and independence  Pt Will Go Up / Down Stairs: Goal met      Louanna Raw, PT,  DPT   Pager number: [phone]    Time of Treatment:  PT Received On: 06/17/16  Start Time: 0920  Stop Time: 0938  Time Calculation (min): 18 min  Treatment # 2 out of 5 visits

## 2016-06-17 NOTE — Progress Notes (Addendum)
CV SURGERY ICU PROGRESS NOTE      POD: 5    Surgery:  Procedure(s):  1.  Coronary Artery Bypass x 4.  2.  Endoscopic, Left Leg Greater Saphenous Vein Harvest      EF: 70%  Surgeon: Bjorn Loser, MD      Assessment/Plan:   Active Problems:    NSTEMI (non-ST elevated myocardial infarction)      Neurological/?:    Intact, MAE   Hx CVA in past, no deficits    PT/OT: Now recommending home with home health    Cardiovascular:   CAD s/p CABG   SR- 60s, continue on Amiodarone, no events, decreasing to 200 mg BID today   Hypertension: allowing permissive HTN for renal pefusion, SBP <150. Clonidine 0.2 mg/q8 , Coreg25 and Norvasc10. 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   AKI stabilizing, non oliguric, monitor I/Os   Metabolic Acidosis: improving; continue po Bicarb per renal    Foley: removed.    Hematological:   Acute on Chronic Anemia: improved Hematocrit: 32 Platelets: 225, on aranesp weekly.   DVT prophylaxis: SCDs and subq Heparin    Infectious Disease:   Afebrile;   WBC: 7   No S/Sx of infection   Lines: PIVs    Endocrinological:   DM II, insulin GTT d/c'd    On SSI   On Januvia, renal dosed. Increased SSI Med today, will need endocrine consult prior to discharge   HgbA1c: 6.3    Plan:   Monitor blood pressure, allowing SBP 150s for renal perfusion   Titrate up on po meds as allowed   F/u with nephrology  regarding need for dialysis   Consult endocrine for worsening hyperglycemia   Likely discharge home on monday      Subjective:   Interval History: No issues overnight. Denies any specific complaint.       Objective:   Vital signs for last 24 hours:  Temp:  [97.1 F (36.2 C)-98.4 F (36.9 C)] 97.6 F (36.4 C)  Heart Rate:  [64-83] 67  Resp Rate:  [14-18] 18  BP: (134-185)/(67-86) 134/67    Neuro: Intact, moves all  Cardiac: RRR no murmur or rub  Lungs: Decreased bilat, but clear  Abdomen: soft NT ND BS hypoactive  Extremities: warm pulse intact trace pedal edema  Wounds: C/D/I        Medications:   Scheduled Meds:  Current Facility-Administered Medications   Medication Dose Route Frequency   . amiodarone  400 mg Oral Q12H [facility]   . amLODIPine  10 mg Oral Daily   . aspirin EC  325 mg Oral Daily   . carvedilol  25 mg Oral Q12H [facility]   . cloNIDine  0.2 mg Oral Q8H   . darbepoetin alfa  60 mcg  Subcutaneous Weekly   . famotidine  20 mg Oral Daily   . heparin (porcine)  5,000 Units Subcutaneous Q12H [facility]   . hydrALAZINE  100 mg Oral Q8H [facility]   . polyethylene glycol  17 g Oral Daily   . senna-docusate  1 tablet Oral BID   . simvastatin  20 mg Oral QHS   . SITagliptin  25 mg Oral Daily       Labs:   CBC:   Recent Labs      06/17/16   0455   WBC  7.78   Hgb  10.3*   Hematocrit  32.3*   Platelets  225     BMP:   Recent Labs      06/17/16   0455   Sodium  132*   Potassium  4.7   Chloride  104   CO2  24   BUN  53.0*   Creatinine  2.6*   Glucose  130*   Magnesium  2.1   Calcium  7.8*   Phosphorus  3.4     LFTs:   No results for input(s): AST, ALT, ALKPHOS, BILITOTAL, BILIDIRECT, BILIINDIRECT, PROT, ALB in the last 72 hours.  INR:   No results for input(s): PT, INR, APTT in the last 72 hours.  ABG:   No results for input(s): PHART, PO2ART, PCO2ART, HCO3ART, BEART in the last 72 hours.      Microbiology (past 7 days):         Radiology (past 7 days):   Chest xray: see above      Ave Filter, NP  Cardiovascular Surgery

## 2016-06-17 NOTE — Plan of Care (Signed)
Problem: Pain  Goal: Pain at adequate level as identified by patient  Outcome: Progressing   06/17/16 1630   Goal/Interventions addressed this shift   Pain at adequate level as identified by patient Identify patient comfort function goal;Reassess pain within 30-60 minutes of any procedure/intervention, per Pain Assessment, Intervention, Reassessment (AIR) Cycle;Evaluate if patient comfort function goal is met     Pt AxOx4, primarily [place] speaking with family translating, afebrile, SpO2 95% on RA, BP 130-170s, SR HR 60-80s. Pt tolerating cardiac/CC/80 g protein diet, foley d/c'd this shift and voiding adequately, -BM +BS +gas. Pt OOB with 1 assist, walked hallway x 2 this shift. MSI, LLE, and b/l groins OTA, well approximated, no drainage. Pt's pain well controlled with PRN Tylenol. Call light and belongings within reach. Family at the bedside. Will continue hourly rounding.

## 2016-06-17 NOTE — Plan of Care (Addendum)
Problem: Safety  Goal: Patient will be free from injury during hospitalization  Outcome: Progressing  Hourly rounding completed, call bell & possessions within reach, 3 bed rails up, chair & bed brakes on & at lowest height, fall mat down   06/17/16 0128   Goal/Interventions addressed this shift   Patient will be free from injury during hospitalization  Assess patient's risk for falls and implement fall prevention plan of care per policy;Provide and maintain safe environment;Use appropriate transfer methods;Ensure appropriate safety devices are available at the bedside;Include patient/ family/ care giver in decisions related to safety;Hourly rounding       Problem: Pain  Goal: Pain at adequate level as identified by patient  Outcome: Progressing  Incisional chest tightness & ache managed with Tylenol   06/17/16 0128   Goal/Interventions addressed this shift   Pain at adequate level as identified by patient Identify patient comfort function goal;Assess for risk of opioid induced respiratory depression, including snoring/sleep apnea. Alert healthcare team of risk factors identified.;Assess pain on admission, during daily assessment and/or before any "as needed" intervention(s);Reassess pain within 30-60 minutes of any procedure/intervention, per Pain Assessment, Intervention, Reassessment (AIR) Cycle;Evaluate if patient comfort function goal is met;Evaluate patient's satisfaction with pain management progress;Offer non-pharmacological pain management interventions;Consult/collaborate with Pain Service       Problem: Hemodynamic Status: Cardiac  Goal: Stable vital signs and fluid balance  Outcome: Not Progressing  SBP 150-160s, NSR, I&Os, +1 BLE edema   06/17/16 0128   Goal/Interventions addressed this shift   Stable vital signs and fluid balance Monitor/assess vital signs and telemetry per unit protocol;Weigh on admission and record weight daily;Assess signs and symptoms associated with cardiac rhythm changes;Monitor  intake/output per unit protocol and/or LIP order;Monitor lab values;Monitor for leg swelling/edema and report to LIP if abnormal       Comments: A&Ox4, speaks [place], daughter Optometrist at bedside with consent signed in chart. Afebrile, SBP 150-160s, NSR 60-70s on tele, denies dizziness/lightheadedness/palpitations/chest pain. O2 sating >95% on RA, denies SOB, encouraged to use IS q1h & reaches max 750. Fair appetite for cardiac & CHO with 80g protein restriction, foley care completed, active bowel sounds, passing flatus. OOB with assist x1, ambulated in hallway, SCDs on. Scattered bruising, nonpitting BUE edema, +1 BLE edema, kept legs elevated on pillows. MS & LLE incisions CDI & OTA, LLE tender to touch with palpable distal pulses & hardness decreased with heating pad. C/o incisional chest ache & tightness managed with Tylenol & tramadol. Call bell & possessions on bedside table within reach. Chair & bed locked at lowest position, 2 bed rails up, fall mat down. Continuing to monitor via hourly rounding, will report any changes.    00:40 - notified NP Cristin Bowman of SBP 160s but asymptomatic, ordered to give 0.1mg  clonidine PO early    05:40 - notified NP Cristin of SBP 180s but asymptomatic & just gave 100mg  hydralazine PO, ordered to give 10mg  hydralazine PRN & increased PO clonidine from 0.1 to 0.2mg  for 10:00

## 2016-06-17 NOTE — Progress Notes (Signed)
[facility].  Progress Note    Assessment:    - AKI, non-oliguric ATN, serum creatinine better at 2.6 mg/dL; good UOP    - CABG x 4 on 10/9  - CKD-4, recent baseline Cr 1.9 to 2.6  - HTN with CKD; uncontrolled  - Volume overload; much improved   - Metabolic acidosis, non-AG  - Anemia CKD  - Hyper-phosphatemia    Plan:    -continue to hold diuretics unless she gets SOB  -supportive care   -No immediate need for RRT  -ESA  -continue to document UOP    Linde Gillis, MD  Office - [phone]  Spect[employer] Link - [phone]  ++++++++++++++++++++++++++++++++++++++++++++++++++++++++++++++  Subjective:    Feels better     Medications:  Scheduled Meds:  Current Facility-Administered Medications   Medication Dose Route Frequency   . amiodarone  400 mg Oral Q12H [facility]   . amLODIPine  10 mg Oral Daily   . aspirin EC  325 mg Oral Daily   . carvedilol  25 mg Oral Q12H [facility]   . cloNIDine  0.2 mg Oral Q8H   . darbepoetin alfa  60 mcg Subcutaneous Weekly   . famotidine  20 mg Oral Daily   . heparin (porcine)  5,000 Units Subcutaneous Q12H [facility]   . hydrALAZINE  100 mg Oral Q8H [facility]   . polyethylene glycol  17 g Oral Daily   . senna-docusate  1 tablet Oral BID   . simvastatin  20 mg Oral QHS   . SITagliptin  25 mg Oral Daily     Continuous Infusions:   PRN Meds:acetaminophen, Nursing communication: Adult Hypoglycemia Treatment Algorithm **AND** dextrose **AND** dextrose **AND** glucagon (rDNA) **AND** Nursing communication: Document Significant Event Note, hydrALAZINE, insulin lispro, insulin lispro, ondansetron, traMADol    Objective:  Vital signs in last 24 hours:  Temp:  [97.1 F (36.2 C)-98.4 F (36.9 C)] 98.3 F (36.8 C)  Heart Rate:  [66-80] 80  Resp Rate:  [14-18] 18  BP: (143-185)/(68-86) 177/85  Intake/Output last 24 hours:    Intake/Output Summary (Last 24 hours) at 06/17/16 0853  Last data filed at 06/17/16 0530   Gross per 24 hour   Intake             1820 ml   Output             2625 ml    Net             -805 ml     Intake/Output this shift:  No intake/output data recorded.    Physical Exam:   Gen: WD WN NAD   CV: S1 S2 N RRR   Chest: CTAB   Ab: ND NT soft no HSM +BS   Ext: No C/E    Labs:    Recent Labs  Lab 06/17/16  0455 06/16/16  0424 06/15/16  0153  06/13/16  0817   Glucose 130* 122* 178* More results in Results Review  --    BUN 53.0* 54.0* 48.0* More results in Results Review  --    Creatinine 2.6* 3.1* 3.2* More results in Results Review  --    Calcium 7.8* 8.0* 8.0* More results in Results Review  --    Sodium 132* 131* 133* More results in Results Review  --    Potassium 4.7 4.3 4.7 More results in Results Review  --    Chloride 104 101 102 More results in Results Review  --  CO2 24 21* 23 More results in Results Review  --    Albumin  --   --   --   --  3.0*   Phosphorus 3.4 4.6 6.6* More results in Results Review  --    Magnesium 2.1 2.1 2.5 More results in Results Review  --    More results in Results Review = values in this interval not displayed.    Recent Labs  Lab 06/17/16  0455 06/15/16  0152 06/14/16  0108   WBC 7.78 12.19* 13.56*   Hgb 10.3* 9.4* 9.1*   Hematocrit 32.3* 28.9* 28.5*   MCV 72.3* 73.0* 74.2*   MCH 23.0* 23.7* 23.7*   MCHC 31.9* 32.5 31.9*   RDW 21* 21* 21*   MPV 10.4 10.8 11.1   Platelets 225 190 156

## 2016-06-18 LAB — BASIC METABOLIC PANEL
BUN: 47 mg/dL — ABNORMAL HIGH (ref 7.0–19.0)
CO2: 23 mEq/L (ref 22–29)
Calcium: 7.7 mg/dL — ABNORMAL LOW (ref 8.5–10.5)
Chloride: 104 mEq/L (ref 100–111)
Creatinine: 2.3 mg/dL — ABNORMAL HIGH (ref 0.6–1.0)
Glucose: 132 mg/dL — ABNORMAL HIGH (ref 70–100)
Potassium: 4.6 mEq/L (ref 3.5–5.1)
Sodium: 132 mEq/L — ABNORMAL LOW (ref 136–145)

## 2016-06-18 LAB — GLUCOSE WHOLE BLOOD - POCT
Whole Blood Glucose POCT: 135 mg/dL — ABNORMAL HIGH (ref 70–100)
Whole Blood Glucose POCT: 194 mg/dL — ABNORMAL HIGH (ref 70–100)
Whole Blood Glucose POCT: 133 mg/dL — ABNORMAL HIGH (ref 70–100)

## 2016-06-18 LAB — PHOSPHORUS: Phosphorus: 3 mg/dL (ref 2.3–4.7)

## 2016-06-18 LAB — MAGNESIUM: Magnesium: 1.9 mg/dL (ref 1.6–2.6)

## 2016-06-18 LAB — GFR: EGFR: 21.4

## 2016-06-18 MED ORDER — MAGNESIUM CITRATE 1.745 GM/30ML PO SOLN
148.0000 mL | Freq: Once | ORAL | Status: AC
Start: 2016-06-18 — End: 2016-06-18
  Administered 2016-06-18: 148 mL via ORAL
  Filled 2016-06-18 (×2): qty 296

## 2016-06-18 MED ORDER — BUMETANIDE 0.25 MG/ML IJ SOLN
2.0000 mg | Freq: Once | INTRAMUSCULAR | Status: AC
Start: 2016-06-18 — End: 2016-06-18
  Administered 2016-06-18: 2 mg via INTRAVENOUS
  Filled 2016-06-18: qty 8

## 2016-06-18 MED ORDER — MAGNESIUM SULFATE IN D5W 1-5 GM/100ML-% IV SOLN
1.0000 g | Freq: Once | INTRAVENOUS | Status: AC
Start: 2016-06-18 — End: 2016-06-18
  Administered 2016-06-18: 1 g via INTRAVENOUS
  Filled 2016-06-18: qty 100

## 2016-06-18 NOTE — Progress Notes (Signed)
[facility].  Progress Note    Assessment:    - AKI, non-oliguric ATN, serum creatinine better at 2.3 mg/dL; good UOP    - CABG x 4 on 10/9  - CKD-4, recent baseline Cr 1.9 to 2.6  - HTN with CKD; uncontrolled  - Volume overload; much improved   - Metabolic acidosis, non-AG  - Anemia CKD  - Hyper-phosphatemia    Plan:    -Bumex 2 mg IV one dose   -supportive care   -No need for RRT  -ESA  -continue to document UOP    Linde Gillis, MD  Office - [phone]  Spectra Link - [phone]  ++++++++++++++++++++++++++++++++++++++++++++++++++++++++++++++  Subjective:  No new complaints    Medications:  Scheduled Meds:  Current Facility-Administered Medications   Medication Dose Route Frequency   . amiodarone  200 mg Oral Q12H [facility]   . amLODIPine  10 mg Oral Daily   . aspirin EC  325 mg Oral Daily   . carvedilol  25 mg Oral Q12H [facility]   . cloNIDine  0.2 mg Oral Q8H   . darbepoetin alfa  60 mcg Subcutaneous Weekly   . famotidine  20 mg Oral Daily   . heparin (porcine)  5,000 Units Subcutaneous Q12H [facility]   . hydrALAZINE  100 mg Oral Q8H [facility]   . magnesium sulfate  1 g Intravenous Once   . polyethylene glycol  17 g Oral Daily   . senna-docusate  1 tablet Oral BID   . simvastatin  20 mg Oral QHS   . SITagliptin  25 mg Oral Daily     Continuous Infusions:   PRN Meds:acetaminophen, Nursing communication: Adult Hypoglycemia Treatment Algorithm **AND** dextrose **AND** dextrose **AND** glucagon (rDNA) **AND** Nursing communication: Document Significant Event Note, hydrALAZINE, insulin lispro, insulin lispro, ondansetron, traMADol    Objective:  Vital signs in last 24 hours:  Temp:  [97.6 F (36.4 C)-98.5 F (36.9 C)] 98.5 F (36.9 C)  Heart Rate:  [61-83] 62  Resp Rate:  [16-18] 16  BP: (134-170)/(67-81) 170/81  Intake/Output last 24 hours:    Intake/Output Summary (Last 24 hours) at 06/18/16 0904  Last data filed at 06/18/16 0600   Gross per 24 hour   Intake             1120 ml   Output              1750 ml   Net             -630 ml     Intake/Output this shift:  No intake/output data recorded.    Physical Exam:   Gen: WD WN NAD   CV: S1 S2 N RRR   Chest: CTAB   Ab: ND NT soft no HSM +BS   Ext: 2+ edema     Labs:    Recent Labs  Lab 06/18/16  0405 06/17/16  0455 06/16/16  0424  06/13/16  0817   Glucose 132* 130* 122* More results in Results Review  --    BUN 47.0* 53.0* 54.0* More results in Results Review  --    Creatinine 2.3* 2.6* 3.1* More results in Results Review  --    Calcium 7.7* 7.8* 8.0* More results in Results Review  --    Sodium 132* 132* 131* More results in Results Review  --    Potassium 4.6 4.7 4.3 More results in Results Review  --    Chloride 104 104 101 More results  in Results Review  --    CO2 23 24 21* More results in Results Review  --    Albumin  --   --   --   --  3.0*   Phosphorus 3.0 3.4 4.6 More results in Results Review  --    Magnesium 1.9 2.1 2.1 More results in Results Review  --    More results in Results Review = values in this interval not displayed.    Recent Labs  Lab 06/17/16  0455 06/15/16  0152 06/14/16  0108   WBC 7.78 12.19* 13.56*   Hgb 10.3* 9.4* 9.1*   Hematocrit 32.3* 28.9* 28.5*   MCV 72.3* 73.0* 74.2*   MCH 23.0* 23.7* 23.7*   MCHC 31.9* 32.5 31.9*   RDW 21* 21* 21*   MPV 10.4 10.8 11.1   Platelets 225 190 156

## 2016-06-18 NOTE — Plan of Care (Signed)
Problem: Pain  Goal: Pain at adequate level as identified by patient  Outcome: Progressing   06/18/16 0257   Goal/Interventions addressed this shift   Pain at adequate level as identified by patient Reassess pain within 30-60 minutes of any procedure/intervention, per Pain Assessment, Intervention, Reassessment (AIR) Cycle;Identify patient comfort function goal       Problem: Renal Instability  Goal: Fluid and electrolyte balance are achieved/maintained  Outcome: Progressing   06/18/16 0257   Goal/Interventions addressed this shift   Fluid and electrolyte balance are achieved/maintained  Monitor intake and output every shift;Provide adequate hydration;Observe for cardiac arrhythmias       Patient A&Ox4, [place] speaking, understands broken english, Family at bedside translates. VSS, SR on tele. BP 140's-160's, on RA. OOBx1 person assist. Voiding well. No complaints of pain. Patient has MSI and LLE and Bilateral groin incisions CDI. Safety precautions in place fall matt down, call bell in reach. Will continue to monitor plan of care with hourly rounding.

## 2016-06-18 NOTE — Plan of Care (Signed)
Problem: Hemodynamic Status: Cardiac  Goal: Stable vital signs and fluid balance  Outcome: Progressing   06/18/16 1735   Goal/Interventions addressed this shift   Stable vital signs and fluid balance Monitor/assess vital signs and telemetry per unit protocol;Monitor intake/output per unit protocol and/or LIP order;Monitor for leg swelling/edema and report to LIP if abnormal     Pt AxOx4, primarily [place] speaking with family translating, afebrile, SpO2 94% on RA, BP 120-170s, SR HR 60-80s. Pt tolerating cardiac/CC/80 g protein diet, diuresing with IV Bumex and voiding adequately, -BM +BS +gas. Pt OOB with 1 assist, walked hallway x 2 this shift. MSI, LLE, and b/l groins OTA, well approximated, no drainage. Pt's pain well controlled with PRN Tylenol. Call light and belongings within reach. Family at the bedside. Will continue hourly rounding.     Pt c/o increase pain to b/l LEs upon ambulation to bathroom this afternoon. NP made aware and in to assess. No overt signs of DVT. Plan for LE [place] to r/o DVT.

## 2016-06-18 NOTE — Progress Notes (Addendum)
CV SURGERY CTUN/SDU PROGRESS NOTE      POD: 6    Surgery:  Procedure(s):  1.  Coronary Artery Bypass x 4.  2.  Endoscopic, Left Leg Greater Saphenous Vein Harvest      EF: 70%  Surgeon: Jacqueline Loser, MD      Assessment/Plan:   Active Problems:    NSTEMI (non-ST elevated myocardial infarction)      Neurological/?:    Intact, MAE   Hx CVA in past, no deficits    PT/OT: Now recommending home with home health    Cardiovascular:   CAD s/p CABG   SR- 60s, continue on Amiodarone, no events, decreasing to 200 mg BID today   Hypertension: allowing permissive HTN for renal pefusion, SBP <150. Clonidine 0.2 mg/q8 , Coreg25 and Norvasc10. Holding home Cozaar due to CKD, Creatinine improving 2.3 today, Baseline (2.1)   Core Measures: Aspirin - yes; Beta blocker - yes; ACE - NA due to renal function; Statin - yes   Pacing wires: removed    Pulmonary:   RA- 95-98%   Chest tubes (past 12 hours): removed   Chest X-ray: 10/12, bibasilar atelectasis    Gastrointestinal:   Nutrition: Cardiac, diabetic diet   passing gas   GI prophylaxis: Pepcid    Renal/GU:   Volume Overload:   -550L/24hrs   Overall:  +5.3L   CKD stage IV with baseline Creatinine 2.0-2.4   Creatinine:  2.3 (2.6)   Nephrology following, recommends single dose of bumex 2mg  IV today.   AKI stabilizing, non oliguric, monitor I/Os   Consulting for discharge recommendations, likely 24-48 hours.   Metabolic Acidosis: improving; continue po Bicarb per renal    Foley: removed.    Hematological:   Acute on Chronic Anemia: improved Hematocrit: 32 Platelets: 225, on aranesp weekly.   Will check CBC prior to discharge   DVT prophylaxis: SCDs and subq Heparin   New complaint of LE calf and lege pain, when sitting and ambulating, on exam legs are soft, non tender, symmetrical and non edematous. scattered ecchymoses noted at LLE SVG harvest site. Incisions, c/d/i.   - negative homans sign, will order LE duplex US to rule out DVT.      Infectious  Disease:   Afebrile;   WBC: 7   No S/Sx of infection   Lines: PIVs    Endocrinological:   DM II,    On SSI   On Januvia, renal dosed   SSI Med today, increased yesterday, will need endocrine consult Monday.   HgbA1c: 6.3    Plan:   Monitor blood pressure, allowing SBP 150s for renal perfusion   Titrate up on po meds as allowed   F/u with nephrology regarding discharge recomendations   Consult endocrine for worsening hyperglycemia   Likely discharge home 24 to 48 hours      Subjective:   Interval History: No issues overnight, complains of mild soreness in legs after ambulating,  No other complaints.     Objective:   Vital signs for last 24 hours:  Temp:  [97.6 F (36.4 C)-98.5 F (36.9 C)] 98.5 F (36.9 C)  Heart Rate:  [61-70] 66  Resp Rate:  [16-18] 16  BP: (122-170)/(60-81) 122/60    Neuro: Intact, moves all  Cardiac: RRR no murmur or rub  Lungs: Decreased bilat, but clear  Abdomen: soft NT ND BS +  Extremities: warm pulse intact trace pedal edema, LE symmetrical on exam. Mild ecchymosis on LLE at SVG harvest site  without edema, swelling or redness.  Wounds: C/D/I        Medications:   Scheduled Meds:  Current Facility-Administered Medications   Medication Dose Route Frequency   . amiodarone  200 mg Oral Q12H [facility]   . amLODIPine  10 mg Oral Daily   . aspirin EC  325 mg Oral Daily   . bumetanide  2 mg Intravenous Once   . carvedilol  25 mg Oral Q12H [facility]   . cloNIDine  0.2 mg Oral Q8H   . darbepoetin alfa  60 mcg Subcutaneous Weekly   . famotidine  20 mg Oral Daily   . heparin (porcine)  5,000 Units Subcutaneous Q12H [facility]   . hydrALAZINE  100 mg Oral Q8H [facility]   . polyethylene glycol  17 g Oral Daily   . senna-docusate  1 tablet Oral BID   . simvastatin  20 mg Oral QHS   . SITagliptin  25 mg Oral Daily       Labs:   CBC:   Recent Labs      06/17/16   0455   WBC  7.78   Hgb  10.3*   Hematocrit  32.3*   Platelets  225     BMP:   Recent Labs      06/18/16   0405   Sodium  132*   Potassium  4.6   Chloride   104   CO2  23   BUN  47.0*   Creatinine  2.3*   Glucose  132*   Magnesium  1.9   Calcium  7.7*   Phosphorus  3.0     LFTs:   No results for input(s): AST, ALT, ALKPHOS, BILITOTAL, BILIDIRECT, BILIINDIRECT, PROT, ALB in the last 72 hours.  INR:   No results for input(s): PT, INR, APTT in the last 72 hours.  ABG:   No results for input(s): PHART, PO2ART, PCO2ART, HCO3ART, BEART in the last 72 hours.      Microbiology (past 7 days):         Radiology (past 7 days):   Chest xray: see above      Ave Filter, ACNP  Cardiovascular Surgery

## 2016-06-19 ENCOUNTER — Other Ambulatory Visit: Payer: Self-pay

## 2016-06-19 LAB — CBC
Absolute NRBC: 0.02 10*3/uL — ABNORMAL HIGH
Hematocrit: 30.2 % — ABNORMAL LOW (ref 37.0–47.0)
Hgb: 9.6 g/dL — ABNORMAL LOW (ref 12.0–16.0)
MCH: 23.4 pg — ABNORMAL LOW (ref 28.0–32.0)
MCHC: 31.8 g/dL — ABNORMAL LOW (ref 32.0–36.0)
MCV: 73.5 fL — ABNORMAL LOW (ref 80.0–100.0)
MPV: 10.2 fL (ref 9.4–12.3)
Nucleated RBC: 0.2 /100 WBC (ref 0.0–1.0)
Platelets: 227 10*3/uL (ref 140–400)
RBC: 4.11 10*6/uL — ABNORMAL LOW (ref 4.20–5.40)
RDW: 20 % — ABNORMAL HIGH (ref 12–15)
WBC: 8.08 10*3/uL (ref 3.50–10.80)

## 2016-06-19 LAB — BASIC METABOLIC PANEL
BUN: 41 mg/dL — ABNORMAL HIGH (ref 7.0–19.0)
CO2: 22 mEq/L (ref 22–29)
Calcium: 7.9 mg/dL — ABNORMAL LOW (ref 8.5–10.5)
Chloride: 103 mEq/L (ref 100–111)
Creatinine: 2.2 mg/dL — ABNORMAL HIGH (ref 0.6–1.0)
Glucose: 136 mg/dL — ABNORMAL HIGH (ref 70–100)
Potassium: 4.5 mEq/L (ref 3.5–5.1)
Sodium: 132 mEq/L — ABNORMAL LOW (ref 136–145)

## 2016-06-19 LAB — MAGNESIUM: Magnesium: 2.2 mg/dL (ref 1.6–2.6)

## 2016-06-19 LAB — PHOSPHORUS: Phosphorus: 2.9 mg/dL (ref 2.3–4.7)

## 2016-06-19 LAB — GFR: EGFR: 22.5

## 2016-06-19 MED ORDER — TRAMADOL HCL 50 MG PO TABS
50.0000 mg | ORAL_TABLET | Freq: Three times a day (TID) | ORAL | 0 refills | Status: DC | PRN
Start: 2016-06-19 — End: 2016-09-12
  Filled 2016-06-19: qty 30, 10d supply, fill #0

## 2016-06-19 MED ORDER — BUMETANIDE 1 MG PO TABS
0.5000 mg | ORAL_TABLET | Freq: Two times a day (BID) | ORAL | 0 refills | Status: DC
Start: 2016-06-19 — End: 2016-07-08
  Filled 2016-06-19: qty 30, 30d supply, fill #0
  Filled 2016-06-19: fill #0

## 2016-06-19 MED ORDER — AMIODARONE HCL 200 MG PO TABS
ORAL_TABLET | ORAL | 0 refills | Status: DC
Start: 2016-06-19 — End: 2016-07-06
  Filled 2016-06-19: fill #0
  Filled 2016-06-19: qty 10, 10d supply, fill #0

## 2016-06-19 MED ORDER — HYDRALAZINE HCL 100 MG PO TABS
100.0000 mg | ORAL_TABLET | Freq: Three times a day (TID) | ORAL | 0 refills | Status: DC
Start: 2016-06-19 — End: 2016-08-10
  Filled 2016-06-19: qty 60, 20d supply, fill #0

## 2016-06-19 MED ORDER — SITAGLIPTIN PHOSPHATE 25 MG PO TABS
25.0000 mg | ORAL_TABLET | Freq: Every day | ORAL | 0 refills | Status: DC
Start: 2016-06-20 — End: 2016-06-21
  Filled 2016-06-19: qty 30, 30d supply, fill #0

## 2016-06-19 MED ORDER — BUMETANIDE 1 MG PO TABS
0.5000 mg | ORAL_TABLET | Freq: Two times a day (BID) | ORAL | Status: DC
Start: 2016-06-19 — End: 2016-06-19

## 2016-06-19 MED ORDER — CLONIDINE HCL 0.2 MG PO TABS
0.2000 mg | ORAL_TABLET | Freq: Two times a day (BID) | ORAL | 0 refills | Status: DC
Start: 2016-06-19 — End: 2017-07-16
  Filled 2016-06-19: fill #0
  Filled 2016-06-19: qty 60, 30d supply, fill #0

## 2016-06-19 MED ORDER — ACETAMINOPHEN 325 MG PO TABS
650.0000 mg | ORAL_TABLET | ORAL | Status: DC | PRN
Start: 2016-06-19 — End: 2017-10-04

## 2016-06-19 MED ORDER — SENNOSIDES-DOCUSATE SODIUM 8.6-50 MG PO TABS
2.0000 | ORAL_TABLET | Freq: Two times a day (BID) | ORAL | 0 refills | Status: DC
Start: 2016-06-19 — End: 2017-10-04
  Filled 2016-06-19: fill #0

## 2016-06-19 NOTE — Progress Notes (Signed)
[facility].  Progress Note    Assessment:    - AKI, non-oliguric ATN, serum creatinine better at 2.3 mg/dL; good UOP    - CABG x 4 on 10/9  - CKD-4, recent baseline Cr 1.9 to 2.6  - HTN with CKD; uncontrolled  - Volume overload; much improved   - Metabolic acidosis, non-AG  - Anemia CKD  - Hyper-phosphatemia    Plan:    -Can c/w bumex 0.5mg  PO BID  -Can be discharged from renal standpoint and f/u in clinic in 2 weeks  -Patient can check daily weights at home and bring record to clinic so that bumex can be adjusted if needed  -supportive care     Dayna Barker, MD  Office - [phone]  [employer] - [phone]  ++++++++++++++++++++++++++++++++++++++++++++++++++++++++++++++  Subjective:  No new complaints    Medications:  Scheduled Meds:  Current Facility-Administered Medications   Medication Dose Route Frequency   . amiodarone  200 mg Oral Q12H SCH   . amLODIPine  10 mg Oral Daily   . aspirin EC  325 mg Oral Daily   . carvedilol  25 mg Oral Q12H SCH   . cloNIDine  0.2 mg Oral Q8H   . darbepoetin alfa  60 mcg Subcutaneous Weekly   . famotidine  20 mg Oral Daily   . heparin (porcine)  5,000 Units Subcutaneous Q12H [facility]   . hydrALAZINE  100 mg Oral Q8H SCH   . polyethylene glycol  17 g Oral Daily   . senna-docusate  1 tablet Oral BID   . simvastatin  20 mg Oral QHS   . SITagliptin  25 mg Oral Daily     Continuous Infusions:   PRN Meds:acetaminophen, Nursing communication: Adult Hypoglycemia Treatment Algorithm **AND** dextrose **AND** dextrose **AND** glucagon (rDNA) **AND** Nursing communication: Document Significant Event Note, hydrALAZINE, insulin lispro, insulin lispro, ondansetron, traMADol    Objective:  Vital signs in last 24 hours:  Temp:  [97.8 F (36.6 C)-98.6 F (37 C)] 97.9 F (36.6 C)  Heart Rate:  [61-73] 62  Resp Rate:  [14-20] 18  BP: (121-170)/(59-81) 147/67  Intake/Output last 24 hours:    Intake/Output Summary (Last 24 hours) at 06/19/16 0711  Last data filed at  06/19/16 0600   Gross per 24 hour   Intake             1040 ml   Output             2360 ml   Net            -1320 ml     Intake/Output this shift:  No intake/output data recorded.    Physical Exam:   Gen: WD WN NAD   CV: S1 S2 N RRR   Chest: CTAB   Ab: ND NT soft no HSM +BS   Ext: 2+ edema     Labs:    Recent Labs  Lab 06/18/16  0405 06/17/16  0455 06/16/16  0424  06/13/16  0817   Glucose 132* 130* 122* More results in Results Review  --    BUN 47.0* 53.0* 54.0* More results in Results Review  --    Creatinine 2.3* 2.6* 3.1* More results in Results Review  --    Calcium 7.7* 7.8* 8.0* More results in Results Review  --    Sodium 132* 132* 131* More results in Results Review  --    Potassium 4.6 4.7 4.3 More results in Results Review  --  Chloride 104 104 101 More results in Results Review  --    CO2 23 24 21* More results in Results Review  --    Albumin  --   --   --   --  3.0*   Phosphorus 3.0 3.4 4.6 More results in Results Review  --    Magnesium 1.9 2.1 2.1 More results in Results Review  --    More results in Results Review = values in this interval not displayed.    Recent Labs  Lab 06/19/16  0600 06/17/16  0455 06/15/16  0152   WBC 8.08 7.78 12.19*   Hgb 9.6* 10.3* 9.4*   Hematocrit 30.2* 32.3* 28.9*   MCV 73.5* 72.3* 73.0*   MCH 23.4* 23.0* 23.7*   MCHC 31.8* 31.9* 32.5   RDW 20* 21* 21*   MPV 10.2 10.4 10.8   Platelets 227 225 190

## 2016-06-19 NOTE — OT Progress Note (Signed)
Occupational Therapy Note    [facility]   Occupational Therapy Treatment     Patient: Jacqueline Weber    MRN#: [id number]   [facility]  Bed: FI267/FI267-01      Discharge Recommendations:   Discharge Recommendation: Home with supervision, Home with home health OT   DME Recommended for Discharge: Shower chair    If Home with supervision, Home with home health OT recommended discharge disposition is not available, patient will need acute rehab.    Assessment:   Pt received seated in chair with daughter in room - alert and agreeable to OT session. Reviewed sternal precautions, home safety, importance of OOB activities and walking progression, log roll and car transfers with verbalized understanding. Pt able to perform sit to stand, functional mobility and bed mobility training with SBA. Pt would benefit from continued acute OT services to maximize functional independence.     Treatment Activities: sternal precautions, bed mobility, log roll, ADLs, walking progression, car transfers, home safety    Educated the patient to role of occupational therapy, plan of care, goals of therapy and safety with mobility and ADLs, energy conservation techniques, sternal precautions, discharge instructions, home safety.    Plan:    OT Frequency Recommended: 2-3x/wk     Continue plan of care.       Precautions and Contraindications:   Falls  Sternal Precautions  Laotian speaking - family in to assist with translation    Updated Medical Status/Imaging/Labs:  Xr Chest 2 Views  Result Date: 06/15/2016  1. Cardiomegaly. 2. Small bilateral effusions. 3. Bibasilar atelectasis.      X-ray Chest Ap Portable (daily)  Result Date: 06/14/2016   Bibasilar atelectasis and/or patchy consolidation and small bilateral pleural effusions. No pneumothorax identified.     X-ray Chest Ap Portable (daily)  Result Date: 06/13/2016   1. Increasing basilar atelectasis and decreased lung volumes following extubation. 2.  Negative for pneumothorax.     X-ray Chest Ap Portable (once)  Result Date: 06/12/2016   Low lung volumes with minimal atelectasis in intubated patient status post sternotomy.       Subjective: "I feel better."   Patient's medical condition is appropriate for Occupational Therapy intervention at this time.  Patient is agreeable to participation in the therapy session. Nursing clears patient for therapy.    Pain: No/Denies      Objective:   Patient is seated in a bedside chair with telemetry and peripheral IV in place.      Cognition  Alert and intact  Follows directions via interpretation    Functional Mobility  Rolling:  SBA  Supine to Sit: SBA, cued for log roll technique to adhere to sternal precautions  Sit to Stand: SBA  Transfers: SBA    Balance  Static Sitting: good  Dynamic Sitting: good  Static Standing: good  Dynamic Standing: fair +    Self Care and Home Management  Eating: ind hand to mouth  Grooming: set up  Bathing: nt - pt just finished shower with family member assist  UE Dressing: Reviewed UB dressing techniques with sternal precautions  LE Dressing: Reviewed LB dressing techniques while seated with verbalized understanding  Toileting: nt    Therapeutic Exercises  Incorporated into session    Participation: good  Endurance: fair    Patient left with call bell within reach, all needs met, SCDs off as found, fall mat in place, chair alarm activated and all questions answered. RN notified of session  outcome and patient response.     Goals:     ADL Goals  Patient will groom self: Supervision, at sinkside  Patient will dress upper body: Supervision  Patient will dress lower body: Supervision  Patient will toilet: Supervision  Mobility and Transfer Goals  Pt will transfer bed to toilet: Supervision        Executive Fucntion Goals  Pt will follow sternal precautions: Recall 3/3, demonstration, to increase ability to complete ADLs                Salli Quarry OTR/L  Pager # [phone]    Time of Treatment  OT  Received On: 06/12/16  Start Time: 1005  Stop Time: 1030  Time Calculation (min): 25 min    Treatment # 2 of 5

## 2016-06-19 NOTE — Plan of Care (Signed)
Problem: Pain  Goal: Pain at adequate level as identified by patient  Outcome: Progressing   06/19/16 0506   Goal/Interventions addressed this shift   Pain at adequate level as identified by patient Identify patient comfort function goal;Assess for risk of opioid induced respiratory depression, including snoring/sleep apnea. Alert healthcare team of risk factors identified.;Assess pain on admission, during daily assessment and/or before any "as needed" intervention(s);Reassess pain within 30-60 minutes of any procedure/intervention, per Pain Assessment, Intervention, Reassessment (AIR) Cycle       Problem: Renal Instability  Goal: Fluid and electrolyte balance are achieved/maintained  Outcome: Progressing   06/19/16 0506   Goal/Interventions addressed this shift   Fluid and electrolyte balance are achieved/maintained  Monitor intake and output every shift;Assess and reassess fluid and electrolyte status;Follow fluid restrictions/IV/PO parameters       Problem: Post-op Phase - Cardiac Surgery  Goal: Effective breathing pattern is maintained  Outcome: Progressing   06/19/16 0506   Goal/Interventions addressed this shift   Effective breathing pattern is maintained  Position patient for maximum ventilatory efficiency with HOB to a minimum of 30 degrees when hemodynamically stable;Reinforce use of ordered respiratory interventions (i.e. CPAP, BiPAP, Incentive Spirometer, Acapella);Monitor for sleep apnea       Comments: Patient A&Ox3 VSS, SR on tele. BP 120-150's on RA. Family at bedside to translate. Patient understands broken [place]. Patient is on CC/80g protein restriction diet. Patient has Midsternal CDI also has LLE incision CDI. Patient has no complaints of pain. Patient is going to have Ultrasound to rule out Dvt's in morning. Possible discharge tomorrow. Safety precautions in place fall matt down, call bell in reach. Will continue to monitor plan of care with hourly rounding.

## 2016-06-19 NOTE — Discharge Summary (Signed)
Discharge Summary    Date:06/19/2016   Patient Name: Jacqueline Weber  Attending Physician: Bjorn Loser, MD    Date of Admission:   06/08/2016    Date of Discharge:   06/19/2016    Admitting Diagnosis:   CAD    Discharge Dx:   CAD s/p CABG x 4  HTN  Diabetes  AKI on CKD, stage 4  H/o CVA  Anemia  NSTEMI  Hyperlipidemia       Procedures performed:   06/12/16 CABG x 4 by Dr. Durenda Guthrie Course:   Jacqueline Weber was admitted to the CVICU postoperatively from the OR.  She was hemodynamically stable.  Hypertensive at times requiring nicardipine.  We allowed for permissive hypertension for better renal perfusion.  The patient has acute on chronic kidney disease.  Renal was following the patient throughout this hospitalization.  She continues on diuresis and is tolerating it well.  Her diabetes was managed with a sliding scale and januvia.  The patient has been ambulating around the unit. PT/OT recommend home services.  She is ready for discharge today.     Condition at Discharge:   stable  Today:     BP 169/77   Pulse 67   Temp 98 F (36.7 C) (Oral)   Resp 18   Ht 1.524 m (5')   Wt 66.9 kg (147 lb 8 oz)   SpO2 95%   BMI 28.81 kg/m   Ranges for the last 24 hours:  Temp:  [97.8 F (36.6 C)-98.6 F (37 C)] 98 F (36.7 C)  Heart Rate:  [61-73] 67  Resp Rate:  [14-20] 18  BP: (121-169)/(59-77) 169/77    Last set of labs     Recent Labs  Lab 06/19/16  0600   WBC 8.08   Hgb 9.6*   Hematocrit 30.2*   Platelets 227       Recent Labs  Lab 06/19/16  0559   Sodium 132*   Potassium 4.5   Chloride 103   CO2 22   BUN 41.0*   Creatinine 2.2*   EGFR 22.5   Glucose 136*   Calcium 7.9*       Micro / Labs / Path pending:     Unresulted Labs     None          Discharge Instructions:     Follow-up Information     [facility] .    Why:  Call to enroll  Contact information:  [place]           Bjorn Loser, MD. Go on 06/23/2016.    Specialties:   Thoracic and Cardiac Surgery, Surgery, Surgical Critical Care  Why:  Please go to cardio-thoracic follow up appointment on 06/23/16 @ 3:30 pm. Please bring discharge instructions and medication list to appointment.  Contact information:  [place] [address]  [phone]             Musio, Annamary Rummage, MD Follow up in 2 week(s).    Specialties:  Nephrology, Internal Medicine  Contact information:  [address]  [place] [address]  [phone]             John Giovanni, MD Follow up in 3 week(s).    Specialty:  Internal Medicine  Contact information:  [phone] [place]  [place] [address]  [phone]  Tedra Coupe, MD. Schedule an appointment as soon as possible for a visit in 2 week(s).    Specialties:  Cardiology, Internal Medicine  Contact information:  [place]  [place] [address]  [phone]                   Discharge Diet: Cardiac Consistent Carbohydrates  Incision Site 06/12/16 Leg Left (Active)   Site Description Clean;Dry;Intact 06/19/2016 12:39 AM   Peri-wound Description Clean;Dry;Intact;Maroon 06/19/2016 12:39 AM   Closure Approximated, closed and dry 06/19/2016 12:39 AM   Drainage Amount None 06/19/2016 12:39 AM   Margins Closed;Attached edges 06/19/2016 12:39 AM   Treatments Cleansed 06/16/2016  7:45 PM   Dressing Open to air 06/19/2016 12:39 AM   Dressing Changed New 06/12/2016  8:00 PM   Dressing Status Clean;Dry;Intact 06/14/2016  4:00 PM   Number of days: 7       Incision Site 06/12/16 Chest (Active)   Site Description Clean;Dry 06/19/2016 12:39 AM   Peri-wound Description Clean;Dry;Intact 06/19/2016 12:39 AM   Closure UTA 06/17/2016  9:30 PM   Drainage Amount None 06/19/2016 12:39 AM   Margins UTA 06/17/2016  9:30 PM   Dressing Open to air 06/19/2016 12:39 AM   Dressing Changed New 06/12/2016  8:00 PM   Dressing Status Clean;Dry;Intact 06/17/2016  9:30 PM   Number of days: 7       Incision Site 06/12/16 Sternum  (Active)   Site Description Clean;Dry;Intact 06/19/2016 12:39 AM   Peri-wound Description Clean;Dry;Intact 06/19/2016 12:39 AM   Closure Approximated, closed and dry;Open to air 06/19/2016 12:39 AM   Drainage Amount None 06/19/2016 12:39 AM   Margins Closed;Attached edges 06/19/2016 12:39 AM   Treatments Cleansed 06/16/2016  7:45 PM   Dressing Open to air 06/19/2016 12:39 AM   Dressing Status Clean;Dry;Intact 06/14/2016  8:00 PM   Number of days: 7       Puncture Site 06/06/16 Femoral Right Groin (Active)   Site Assessment Clean;Dry;Soft;Intact;Ecchymosis 06/19/2016 12:39 AM   Dressing Status Open to air 06/19/2016 12:39 AM   Drainage Amount None 06/19/2016 12:39 AM   Pressure device present? No 06/19/2016 12:39 AM   Number of days: 13          Disposition: home     Discharge Medication List      Taking    acetaminophen 325 MG tablet  Dose:  650 mg  Commonly known as:  TYLENOL  Take 2 tablets (650 mg total) by mouth every 4 (four) hours as needed for Pain or Fever.     amiodarone 200 MG tablet  Commonly known as:  PACERONE  Take 1 tab by mouth for 10 days then stop     amLODIPine 10 MG tablet  Dose:  10 mg  Commonly known as:  NORVASC  Take 1 tablet (10 mg total) by mouth daily.     aspirin EC 325 MG EC tablet  Dose:  325 mg  Take 1 tablet (325 mg total) by mouth daily.     bumetanide 0.5 MG tablet  Dose:  0.5 mg  Commonly known as:  BUMEX  Take 1 tablet (0.5 mg total) by mouth 2 (two) times daily.     carvedilol 25 MG tablet  Dose:  25 mg  Commonly known as:  COREG  Take 25 mg by mouth 2 (two) times daily with meals.     cloNIDine 0.2 MG tablet  Dose:  0.2 mg  What changed:  medication strength   how much to take  Commonly known as:  CATAPRES  Take 1 tablet (0.2 mg total) by mouth 2 (two) times daily.     famotidine 20 MG tablet  Dose:  20 mg  Commonly known as:  PEPCID  Take 1 tablet (20 mg total) by mouth daily.     hydrALAZINE 100 MG tablet  Dose:  100 mg  What changed:  when to take this  Commonly known as:   APRESOLINE  Take 1 tablet (100 mg total) by mouth 3 (three) times daily.     senna-docusate 8.6-50 MG per tablet  Dose:  2 tablet  Commonly known as:  PERICOLACE  Take 2 tablets by mouth 2 (two) times daily.     simvastatin 10 MG tablet  Dose:  10 mg  Commonly known as:  ZOCOR  Take 10 mg by mouth nightly.     SITagliptin 25 MG tablet  Dose:  25 mg  Commonly known as:  JANUVIA  Take 1 tablet (25 mg total) by mouth daily.     traMADol 50 MG tablet  Dose:  50 mg  Commonly known as:  ULTRAM  Take 1 tablet (50 mg total) by mouth every 8 (eight) hours as needed (pain).     vitamin D (ergocalciferol) 50000 UNIT Caps  Dose:  2000 Units  Commonly known as:  DRISDOL  Take 2,000 Units by mouth 2 (two) times daily.        STOP taking these medications    losartan 25 MG tablet  Commonly known as:  COZAAR            Signed by: Rico Sheehan, [place]  Cardiac Surgery

## 2016-06-19 NOTE — Plan of Care (Signed)
Problem: Discharge Barriers  Goal: Patient will be discharged home or other facility with appropriate resources  Outcome: Progressing   06/19/16 1110   Goal/Interventions addressed this shift   Discharge to home or other facility with appropriate resources Provide appropriate patient education;Initiate discharge planning     Pt AxOx4, primarily [place] speaking with family translating, afebrile, SpO2 95% on RA, BP 140-160s, SR HR 60-70s. Pt tolerating cardiac/CC/80 g protein diet, voiding adequately, +BM this AM, +BS +gas. Pt OOB with 1 assist, walked hallway x 1 so far this shift. MSI, LLE, and b/l groins OTA, well approximated, no drainage. Pt's c/o mild incisional pain this morning, denies need for pain medicine. Call light and belongings within reach. Family at the bedside. Alarms on and audible. Will continue hourly rounding.

## 2016-06-19 NOTE — Progress Notes (Signed)
06/16/16 Discharge summary  Pt ready for d/c/ to home with daughter transporting via private car. Daughter will be available to assist pt at home as well. Pt has [facility] scheduled to provide HH:SN, OT, PT services. Has cardio-thoracic f/u appt on 06/23/16 @ 1530; cardiology f/u appt with Dr. Margretta Sidle on 07/03/16 @ 1000 and PCP f/u appt with Dr. Fredna Dow on 07/10/16 @ 0830.  Kandis Mannan RN, [place]  Case Management  #[id number]     06/19/16 1109   Discharge Disposition   Patient preference/choice provided? Yes   Physical Discharge Disposition Home with Needs   Name of [place] up time by afternoon   Patient/Family/POA notified of transfer plan Yes   Patient agreeable to discharge plan/expected d/c date? Yes   Family/POA agreeable to discharge plan/expected d/c date? Yes   Bedside nurse notified of transport plan? Yes   [place] PT/OT/ST;Skilled Nursing  (HH: SN, OT, PT)   CM Interventions   Follow up appointment scheduled? Yes   Follow up appointment scheduled with: PCP;Other (comment)  (PCP f/u with Dr. Fredna Dow on 07/10/16 @ 0830; Cardiology f/u with Dr. Margretta Sidle on 07/03/16 @ 1000 and cardio-thoracic f/u appton 06/23/16 @ 1530.)   Referral made for home health RN visit? Yes   Multidisciplinary rounds/family meeting before d/c? Yes   Medicare Checklist   Is this a Medicare patient? Yes   Patient received 1st IMM Letter? Yes   3 midnight inpatient qualifying stay (SNF only) Yes   If LOS 3 days or greater, did patient received 2nd IMM Letter? Yes

## 2016-06-19 NOTE — Discharge Instr - AVS First Page (Addendum)
Reason for your Hospital Admission:  High blood pressure and chest pain       Instructions for after your discharge:  SEE ACCOMPANYING PURPLE FORM IN YOUR DISCHARGE PACKET   ([place])                                                MY CARDIAC SURGERY        The operation I had was : coronary artery bypass x 4        My Incisions are located: sternum and leg         I needed this operation to: fix the blood flow to the vessels on my heart         My cardiac surgeon is: Dr. Conchita Paris        My ejection fraction is: 70% (a measure of how well my heart pumps - normal is 55-60%)      Home Health Discharge Information     Your doctor has ordered Skilled Nursing, Physical Therapy and Occupational Therapy in-home service(s) for you while you recuperate at home, to assist you in the transition from hospital to home.      The agency that you or your representative chose to provide the service:  Name of [place]:  ([place]. [facility] )] [phone]     The above services were set up by:  Brayton Caves  [facility] Liaison)   Phone    [phone]

## 2016-06-19 NOTE — Progress Notes (Signed)
CV SURGERY CTUN/SDU PROGRESS NOTE      POD: 7    Surgery:  Procedure(s):  1.  Coronary Artery Bypass x 4.  2.  Endoscopic, Left Leg Greater Saphenous Vein Harvest      EF: 70%  Surgeon: Bjorn Loser, MD      Assessment/Plan:   Active Problems:    NSTEMI (non-ST elevated myocardial infarction)      Neurological/?:    Intact, MAE   Hx CVA in past, no deficits    PT/OT: Now recommending home with home health    Cardiovascular:   CAD s/p CABG   HD stable   Hypertension: allowing permissive HTN for renal pefusion, SBP <150.    Core Measures: Aspirin - yes; Beta blocker - yes; ACE - NA due to renal function; Statin - yes   Pacing wires: removed    Pulmonary:   RA   Chest tubes (past 12 hours): removed      Gastrointestinal:   Nutrition: Cardiac, diabetic diet   Had BM    GI prophylaxis: Pepcid    Renal/GU:   Volume Overload:improving, start bumex PO BID   CKD stage IV with baseline Creatinine 2.0-2.4   Creatinine:  2.2   Nephrology following   AKI stabilizing, non oliguric, monitor I/Os   Foley: removed.    Hematological:   Acute on Chronic Anemia: improved    DVT prophylaxis: SCDs and subq Heparin      Infectious Disease:   Afebrile; no active issues   Lines: PIVs    Endocrinological:   DM II,    On SSI   HgbA1c: 6.3    Plan:   - d/c to home today     Subjective:   Interval History: No issues overnight,     Objective:   Vital signs for last 24 hours:  Temp:  [97.8 F (36.6 C)-98.6 F (37 C)] 98 F (36.7 C)  Heart Rate:  [61-73] 67  Resp Rate:  [14-20] 18  BP: (121-169)/(59-77) 169/77    Neuro: Intact, moves all  Cardiac: RRR no murmur or rub  Lungs: Decreased bilat, but clear  Abdomen: soft NT ND BS +  Extremities: warm pulse intact trace pedal edema, LE symmetrical on exam. Mild ecchymosis on LLE at SVG harvest site without edema, swelling or redness.  Wounds: C/D/I        Medications:   Scheduled Meds:  Current Facility-Administered Medications   Medication Dose Route Frequency   .  amiodarone  200 mg Oral Q12H SCH   . amLODIPine  10 mg Oral Daily   . aspirin EC  325 mg Oral Daily   . bumetanide  0.5 mg Oral BID   . carvedilol  25 mg Oral Q12H SCH   . cloNIDine  0.2 mg Oral Q8H   . darbepoetin alfa  60 mcg Subcutaneous Weekly   . famotidine  20 mg Oral Daily   . heparin (porcine)  5,000 Units Subcutaneous Q12H [facility]   . hydrALAZINE  100 mg Oral Q8H SCH   . polyethylene glycol  17 g Oral Daily   . senna-docusate  1 tablet Oral BID   . simvastatin  20 mg Oral QHS   . SITagliptin  25 mg Oral Daily       Labs:   CBC:   Recent Labs      06/19/16   0600   WBC  8.08   Hgb  9.6*   Hematocrit  30.2*   Platelets  227     BMP:   Recent Labs      06/19/16   0559   Sodium  132*   Potassium  4.5   Chloride  103   CO2  22   BUN  41.0*   Creatinine  2.2*   Glucose  136*   Magnesium  2.2   Calcium  7.9*   Phosphorus  2.9     LFTs:   No results for input(s): AST, ALT, ALKPHOS, BILITOTAL, BILIDIRECT, BILIINDIRECT, PROT, ALB in the last 72 hours.  INR:   No results for input(s): PT, INR, APTT in the last 72 hours.  ABG:   No results for input(s): PHART, PO2ART, PCO2ART, HCO3ART, BEART in the last 72 hours.      Microbiology (past 7 days):         Radiology (past 7 days):   Chest xray: see above      Rico Sheehan, PA-C  Cardiovascular Surgery

## 2016-06-19 NOTE — PT Progress Note (Signed)
[facility]   Physical Therapy Cancellation Note      Patient:  Jacqueline Weber MRN#:  [id number]  [facility] Room/Bed:  FI267/FI267-01    06/19/2016  Time: 9:51 AM      Pt not seen for physical therapy secondary to pt showering. Will f/u as appropriate.     Rosana Fret PT, DPT  [phone]

## 2016-06-19 NOTE — Discharge Summary -  Nursing (Signed)
Discharge teaching completed by patient navigator.

## 2016-06-19 NOTE — Progress Notes (Signed)
Home Health Referral          Referral from Kandis Mannan  (Case Manager) for home health care upon discharge.    By [employer], the patient has the right to freely choose a home care provider.  Arrangements have been made with:     A company of the patients choosing. We have supplied the patient with a listing of providers in your area who asked to be included and participate in Medicare.   [facility], a home care agency that provides both adult home care services which is a wholly owned and operated by [employer] and participates in [employer]   The preferred provider of your insurance company. Choosing a home care provider other than your insurance company's preferred provider may affect your insurance coverage.    The Home Health Care Referral Form acknowledging the voluntary selection of the home care company has been completed, signed, and is on file.      Home Health Discharge Information     Your doctor has ordered Skilled Nursing, Physical Therapy and Occupational Therapy in-home service(s) for you while you recuperate at home, to assist you in the transition from hospital to home.      The agency that you or your representative chose to provide the service:  Name of [place]:  ([place] )] [phone]     The above services were set up by:  Brayton Caves  [facility] Liaison)   Phone    [phone]    Signed by: Brayton Caves  Date Time: 06/19/16 10:56 AM

## 2016-06-20 ENCOUNTER — Telehealth: Payer: Self-pay

## 2016-06-20 ENCOUNTER — Emergency Department: Payer: Medicare Other

## 2016-06-20 ENCOUNTER — Observation Stay: Payer: Medicare Other | Admitting: Thoracic Surgery (Cardiothoracic Vascular Surgery)

## 2016-06-20 ENCOUNTER — Observation Stay: Payer: Medicare Other

## 2016-06-20 ENCOUNTER — Observation Stay
Admission: EM | Admit: 2016-06-20 | Discharge: 2016-06-21 | Disposition: A | Discharge status: Home / Self Care | Payer: Medicare Other | Attending: Thoracic Surgery (Cardiothoracic Vascular Surgery) | Admitting: Thoracic Surgery (Cardiothoracic Vascular Surgery)

## 2016-06-20 DIAGNOSIS — Z880 Allergy status to penicillin: Secondary | ICD-10-CM | POA: Insufficient documentation

## 2016-06-20 DIAGNOSIS — I129 Hypertensive chronic kidney disease with stage 1 through stage 4 chronic kidney disease, or unspecified chronic kidney disease: Secondary | ICD-10-CM | POA: Insufficient documentation

## 2016-06-20 DIAGNOSIS — N189 Chronic kidney disease, unspecified: Secondary | ICD-10-CM | POA: Insufficient documentation

## 2016-06-20 DIAGNOSIS — R0602 Shortness of breath: Principal | ICD-10-CM | POA: Insufficient documentation

## 2016-06-20 DIAGNOSIS — Z951 Presence of aortocoronary bypass graft: Secondary | ICD-10-CM | POA: Insufficient documentation

## 2016-06-20 DIAGNOSIS — E7800 Pure hypercholesterolemia, unspecified: Secondary | ICD-10-CM | POA: Insufficient documentation

## 2016-06-20 DIAGNOSIS — Z8673 Personal history of transient ischemic attack (TIA), and cerebral infarction without residual deficits: Secondary | ICD-10-CM | POA: Insufficient documentation

## 2016-06-20 DIAGNOSIS — I252 Old myocardial infarction: Secondary | ICD-10-CM | POA: Insufficient documentation

## 2016-06-20 DIAGNOSIS — J811 Chronic pulmonary edema: Secondary | ICD-10-CM | POA: Insufficient documentation

## 2016-06-20 DIAGNOSIS — J90 Pleural effusion, not elsewhere classified: Secondary | ICD-10-CM | POA: Insufficient documentation

## 2016-06-20 DIAGNOSIS — E1122 Type 2 diabetes mellitus with diabetic chronic kidney disease: Secondary | ICD-10-CM | POA: Insufficient documentation

## 2016-06-20 LAB — CBC AND DIFFERENTIAL
Absolute NRBC: 0.03 10*3/uL — ABNORMAL HIGH
Basophils Absolute Automated: 0.03 10*3/uL (ref 0.00–0.20)
Basophils Automated: 0.3 %
Eosinophils Absolute Automated: 0.37 10*3/uL (ref 0.00–0.70)
Eosinophils Automated: 3.4 %
Hematocrit: 33.9 % — ABNORMAL LOW (ref 37.0–47.0)
Hgb: 10.8 g/dL — ABNORMAL LOW (ref 12.0–16.0)
Immature Granulocytes Absolute: 0.2 10*3/uL — ABNORMAL HIGH
Immature Granulocytes: 1.8 %
Lymphocytes Absolute Automated: 1.25 10*3/uL (ref 0.50–4.40)
Lymphocytes Automated: 11.4 %
MCH: 23.1 pg — ABNORMAL LOW (ref 28.0–32.0)
MCHC: 31.9 g/dL — ABNORMAL LOW (ref 32.0–36.0)
MCV: 72.6 fL — ABNORMAL LOW (ref 80.0–100.0)
MPV: 10.5 fL (ref 9.4–12.3)
Monocytes Absolute Automated: 1.01 10*3/uL (ref 0.00–1.20)
Monocytes: 9.2 %
Neutrophils Absolute: 8.14 10*3/uL — ABNORMAL HIGH (ref 1.80–8.10)
Neutrophils: 73.9 %
Nucleated RBC: 0.3 /100 WBC (ref 0.0–1.0)
Platelets: 299 10*3/uL (ref 140–400)
RBC: 4.67 10*6/uL (ref 4.20–5.40)
RDW: 21 % — ABNORMAL HIGH (ref 12–15)
WBC: 11 10*3/uL — ABNORMAL HIGH (ref 3.50–10.80)

## 2016-06-20 LAB — HEPATIC FUNCTION PANEL
ALT: 12 U/L (ref 0–55)
AST (SGOT): 18 U/L (ref 5–34)
Albumin/Globulin Ratio: 1 (ref 0.9–2.2)
Albumin: 2.8 g/dL — ABNORMAL LOW (ref 3.5–5.0)
Alkaline Phosphatase: 57 U/L (ref 37–106)
Bilirubin Direct: 0.2 mg/dL (ref 0.0–0.5)
Bilirubin Indirect: 0.3 mg/dL (ref 0.0–1.1)
Bilirubin, Total: 0.5 mg/dL (ref 0.2–1.2)
Globulin: 2.8 g/dL (ref 2.0–3.6)
Protein, Total: 5.6 g/dL — ABNORMAL LOW (ref 6.0–8.3)

## 2016-06-20 LAB — ECG 12-LEAD
Atrial Rate: 69 {beats}/min
P Axis: 22 degrees
P-R Interval: 148 ms
Q-T Interval: 410 ms
QRS Duration: 90 ms
QTC Calculation (Bezet): 439 ms
R Axis: 37 degrees
T Axis: -32 degrees
Ventricular Rate: 69 {beats}/min

## 2016-06-20 LAB — BASIC METABOLIC PANEL
BUN: 40 mg/dL — ABNORMAL HIGH (ref 7.0–19.0)
CO2: 24 mEq/L (ref 22–29)
Calcium: 8.4 mg/dL — ABNORMAL LOW (ref 8.5–10.5)
Chloride: 98 mEq/L — ABNORMAL LOW (ref 100–111)
Creatinine: 2 mg/dL — ABNORMAL HIGH (ref 0.6–1.0)
Glucose: 171 mg/dL — ABNORMAL HIGH (ref 70–100)
Potassium: 4.7 mEq/L (ref 3.5–5.1)
Sodium: 128 mEq/L — ABNORMAL LOW (ref 136–145)

## 2016-06-20 LAB — GLUCOSE WHOLE BLOOD - POCT
Whole Blood Glucose POCT: 172 mg/dL — ABNORMAL HIGH (ref 70–100)
Whole Blood Glucose POCT: 178 mg/dL — ABNORMAL HIGH (ref 70–100)
Whole Blood Glucose POCT: 178 mg/dL — ABNORMAL HIGH (ref 70–100)

## 2016-06-20 LAB — LIPASE: Lipase: 41 U/L (ref 8–78)

## 2016-06-20 LAB — B-TYPE NATRIURETIC PEPTIDE: B-Natriuretic Peptide: 673 pg/mL — ABNORMAL HIGH (ref 0–100)

## 2016-06-20 LAB — GFR: EGFR: 25.1

## 2016-06-20 LAB — I-STAT TROPONIN: i-STAT Troponin: 0.06 ng/mL (ref 0.00–0.09)

## 2016-06-20 MED ORDER — INSULIN LISPRO 100 UNIT/ML SC SOLN
1.0000 [IU] | Freq: Three times a day (TID) | SUBCUTANEOUS | Status: DC | PRN
Start: 2016-06-20 — End: 2016-06-21
  Administered 2016-06-20: 1 [IU] via SUBCUTANEOUS
  Administered 2016-06-21: 2 [IU] via SUBCUTANEOUS
  Filled 2016-06-20: qty 3

## 2016-06-20 MED ORDER — ACETAMINOPHEN 325 MG PO TABS
650.0000 mg | ORAL_TABLET | ORAL | Status: DC | PRN
Start: 2016-06-20 — End: 2016-06-21
  Administered 2016-06-20: 650 mg via ORAL
  Filled 2016-06-20: qty 2

## 2016-06-20 MED ORDER — SENNOSIDES-DOCUSATE SODIUM 8.6-50 MG PO TABS
2.0000 | ORAL_TABLET | Freq: Two times a day (BID) | ORAL | Status: DC
Start: 2016-06-20 — End: 2016-06-21
  Administered 2016-06-20 – 2016-06-21 (×3): 2 via ORAL
  Filled 2016-06-20 (×3): qty 2

## 2016-06-20 MED ORDER — GLUCOSE 40 % PO GEL
15.0000 g | ORAL | Status: DC | PRN
Start: 2016-06-20 — End: 2016-06-21

## 2016-06-20 MED ORDER — BUMETANIDE 1 MG PO TABS
0.5000 mg | ORAL_TABLET | Freq: Two times a day (BID) | ORAL | Status: DC
Start: 2016-06-20 — End: 2016-06-21
  Administered 2016-06-20 – 2016-06-21 (×2): 0.5 mg via ORAL
  Filled 2016-06-20 (×3): qty 1

## 2016-06-20 MED ORDER — TECHNETIUM TC 99M PENTETATE AEROSOL
36.0000 | Freq: Once | Status: AC | PRN
Start: 2016-06-20 — End: 2016-06-20
  Administered 2016-06-20: 36 via RESPIRATORY_TRACT
  Filled 2016-06-20: qty 75

## 2016-06-20 MED ORDER — TECHNETIUM TC 99M ALBUMIN AGGREGATED
6.6000 | Freq: Once | Status: AC | PRN
Start: 2016-06-20 — End: 2016-06-20
  Administered 2016-06-20: 7 via INTRAVENOUS
  Filled 2016-06-20: qty 10

## 2016-06-20 MED ORDER — HYDRALAZINE HCL 25 MG PO TABS
100.0000 mg | ORAL_TABLET | Freq: Three times a day (TID) | ORAL | Status: DC
Start: 2016-06-20 — End: 2016-06-21
  Administered 2016-06-20 – 2016-06-21 (×4): 100 mg via ORAL
  Filled 2016-06-20 (×4): qty 4

## 2016-06-20 MED ORDER — OXYCODONE-ACETAMINOPHEN 5-325 MG PO TABS
1.0000 | ORAL_TABLET | Freq: Once | ORAL | Status: AC
Start: 2016-06-20 — End: 2016-06-20
  Administered 2016-06-20: 1 via ORAL
  Filled 2016-06-20: qty 1

## 2016-06-20 MED ORDER — ONDANSETRON HCL 4 MG/2ML IJ SOLN
4.0000 mg | Freq: Every day | INTRAMUSCULAR | Status: DC | PRN
Start: 2016-06-20 — End: 2016-06-21

## 2016-06-20 MED ORDER — HEPARIN SODIUM (PORCINE) 5000 UNIT/ML IJ SOLN
5000.0000 [IU] | Freq: Three times a day (TID) | INTRAMUSCULAR | Status: DC
Start: 2016-06-20 — End: 2016-06-21
  Administered 2016-06-20 – 2016-06-21 (×3): 5000 [IU] via SUBCUTANEOUS
  Filled 2016-06-20 (×3): qty 1

## 2016-06-20 MED ORDER — ASPIRIN 81 MG PO CHEW
162.0000 mg | CHEWABLE_TABLET | Freq: Once | ORAL | Status: DC
Start: 2016-06-20 — End: 2016-06-20
  Filled 2016-06-20: qty 2

## 2016-06-20 MED ORDER — FAMOTIDINE 20 MG PO TABS
20.0000 mg | ORAL_TABLET | Freq: Two times a day (BID) | ORAL | Status: DC
Start: 2016-06-20 — End: 2016-06-20

## 2016-06-20 MED ORDER — BUMETANIDE 0.25 MG/ML IJ SOLN
1.0000 mg | Freq: Once | INTRAMUSCULAR | Status: AC
Start: 2016-06-20 — End: 2016-06-20
  Administered 2016-06-20: 1 mg via INTRAVENOUS
  Filled 2016-06-20 (×2): qty 4

## 2016-06-20 MED ORDER — DEXTROSE 10 % IV BOLUS
125.0000 mL | INTRAVENOUS | Status: DC | PRN
Start: 2016-06-20 — End: 2016-06-21

## 2016-06-20 MED ORDER — SENNOSIDES-DOCUSATE SODIUM 8.6-50 MG PO TABS
1.0000 | ORAL_TABLET | Freq: Two times a day (BID) | ORAL | Status: DC
Start: 2016-06-20 — End: 2016-06-20

## 2016-06-20 MED ORDER — INSULIN LISPRO 100 UNIT/ML SC SOLN
1.0000 [IU] | Freq: Every evening | SUBCUTANEOUS | Status: DC | PRN
Start: 2016-06-20 — End: 2016-06-21
  Administered 2016-06-20: 1 [IU] via SUBCUTANEOUS

## 2016-06-20 MED ORDER — CARVEDILOL 25 MG PO TABS
25.0000 mg | ORAL_TABLET | Freq: Two times a day (BID) | ORAL | Status: DC
Start: 2016-06-20 — End: 2016-06-21
  Administered 2016-06-20 – 2016-06-21 (×2): 25 mg via ORAL
  Filled 2016-06-20 (×6): qty 1

## 2016-06-20 MED ORDER — AMLODIPINE BESYLATE 5 MG PO TABS
10.0000 mg | ORAL_TABLET | Freq: Every day | ORAL | Status: DC
Start: 2016-06-20 — End: 2016-06-21
  Administered 2016-06-20 – 2016-06-21 (×2): 10 mg via ORAL
  Filled 2016-06-20 (×2): qty 2

## 2016-06-20 MED ORDER — SIMVASTATIN 10 MG PO TABS
10.0000 mg | ORAL_TABLET | Freq: Every evening | ORAL | Status: DC
Start: 2016-06-20 — End: 2016-06-21
  Administered 2016-06-20: 10 mg via ORAL
  Filled 2016-06-20: qty 1

## 2016-06-20 MED ORDER — FUROSEMIDE 10 MG/ML IJ SOLN
40.0000 mg | Freq: Once | INTRAMUSCULAR | Status: AC
Start: 2016-06-20 — End: 2016-06-20
  Administered 2016-06-20: 40 mg via INTRAVENOUS
  Filled 2016-06-20: qty 4

## 2016-06-20 MED ORDER — POLYETHYLENE GLYCOL 3350 17 G PO PACK
17.0000 g | PACK | Freq: Every day | ORAL | Status: DC
Start: 2016-06-20 — End: 2016-06-21
  Administered 2016-06-20 – 2016-06-21 (×2): 17 g via ORAL
  Filled 2016-06-20 (×2): qty 1

## 2016-06-20 MED ORDER — GLUCAGON 1 MG IJ SOLR (WRAP)
1.0000 mg | INTRAMUSCULAR | Status: DC | PRN
Start: 2016-06-20 — End: 2016-06-21

## 2016-06-20 MED ORDER — SITAGLIPTIN PHOSPHATE 25 MG PO TABS
25.0000 mg | ORAL_TABLET | Freq: Every day | ORAL | Status: DC
Start: 2016-06-20 — End: 2016-06-21
  Administered 2016-06-20 – 2016-06-21 (×2): 25 mg via ORAL
  Filled 2016-06-20 (×2): qty 1

## 2016-06-20 MED ORDER — CLONIDINE HCL 0.1 MG PO TABS
0.2000 mg | ORAL_TABLET | Freq: Two times a day (BID) | ORAL | Status: DC
Start: 2016-06-20 — End: 2016-06-21
  Administered 2016-06-20 – 2016-06-21 (×3): 0.2 mg via ORAL
  Filled 2016-06-20 (×3): qty 2

## 2016-06-20 MED ORDER — FAMOTIDINE 20 MG PO TABS
20.0000 mg | ORAL_TABLET | Freq: Every day | ORAL | Status: DC
Start: 2016-06-20 — End: 2016-06-21
  Administered 2016-06-20 – 2016-06-21 (×2): 20 mg via ORAL
  Filled 2016-06-20 (×2): qty 1

## 2016-06-20 NOTE — UM Notes (Signed)
Admit to observation on 06/20/16 @ 0413    Patient is a 65 year old female s/p CABG x4 eight days ago who presents to the ED with SOB.      VS: T 98-98.4, HR 60-68, RR 15-22, bp 143/57-210/90, Pox 96-98% RA    Labs: Cr 2.0, glucose 171, Na 128, WBC 11.00, BNP 673, trop neg x1    CXR: Bibasilar atelectasis and bilateral pleural effusions left greater than right. No obvious congestive heart failure.    V/Q scan: Low probability pulmonary embolism    ED Meds: Lasix 40mg  IV x1    10/17 Admit to CVSD  Cardiac tele  Bumex 0.5mg  po bid          Eloy End, BSN, RN, ACM          Utilization Review Case Manager  [facility]  ph: [phone]

## 2016-06-20 NOTE — PT Progress Note (Signed)
Physical Therapy Cancellation Note    Patient: Jacqueline Weber  WLN:[id number]    Unit: FI268/FI268-01    Patient not seen for physical therapy secondary to RN deferred due to high BP. Will f/u later when medically stable and as schedule permits.    06/20/2016  Time: 08:15 AM      Gasper Sells  PT  Pager Number:[phone]

## 2016-06-20 NOTE — Progress Notes (Signed)
06/20/16 Assessment  Met with pt and family .Explained role of CCM.Pt and daughter verbalize understanding. Pt lives with her daughter and family. Daughter does shopping, errands, MD appts. for pt since pt does not drive. Pt had been active and independent but just had surgery so has some limitations she is to comply with. Suppose to have [place] beginning but pt had just d/c home  And had not begun the [facility] services before coming back into hospital d/t SOB. Pt reports she is feeling better today. Has 14 steps to upstairs and several steps into home. Does not use any HH currently, no DME no SNF or acute rehab admissions.. Does not have an Advanced Directive but daughter is designated Scientist, research (medical) if pt unable to speak for herself.  Kandis Mannan RN, CM  Case Management  [phone]     06/20/16 1155   Patient Type   Within 30 Days of Previous Admission? Yes   Healthcare Decisions   Interviewed: Patient   Orientation/Decision Making Abilities of Patient Alert and Oriented x3, able to make decisions   Advance Directive Patient does not have advance directive   [place] Agent's Name Rolanda Campa, daughter   Healthcare Agent's Phone Number [phone]   Prior to admission   Prior level of function Independent with ADLs   Type of Residence Private residence   Home Layout Two level   Have running water, electricity, heat, etc? Yes   Living Arrangements Children   How do you get to your MD appointments? son/daughter   How do you get your groceries? daughter   Who fixes your meals? daughter   Who does your laundry? daughter   Who picks up your prescriptions? daughter   Dressing Independent   Grooming Independent   Feeding Independent   Bathing Independent   Toileting Independent   DME Currently at Home Other (Comment)  (no DME)   Name of Prior [place] None   Prior SNF admission? (Detail) n/a   Prior Rehab admission? (Detail) n/a    Adult Protective Services (APS) involved? No   Discharge Planning   Support Systems Children;Family members;Friends/neighbors   Patient expects to be discharged to: home

## 2016-06-20 NOTE — Plan of Care (Signed)
Problem: Moderate/High Fall Risk Score >5  Goal: Patient will remain free of falls   06/20/16 1523   OTHER   Moderate Risk (6-13) LOW-Fall Interventions Appropriate for Low Fall Risk   Uses call bell  Family member at bedside   Fall matts in place     Problem: Safety  Goal: Patient will be free from injury during hospitalization  Outcome: Progressing  Uses call bell  Hourly rounding  Family member at bedside  Fall matts in place   Goal: Patient will be free from infection during hospitalization  Outcome: Progressing   06/20/16 1523   Goal/Interventions addressed this shift   Free from Infection during hospitalization Assess and monitor for signs and symptoms of infection   No s/s of infection     Problem: Pain  Goal: Pain at adequate level as identified by patient  Outcome: Progressing   06/20/16 1523   Goal/Interventions addressed this shift   Pain at adequate level as identified by patient Identify patient comfort function goal;Reassess pain within 30-60 minutes of any procedure/intervention, per Pain Assessment, Intervention, Reassessment (AIR) Cycle;Assess pain on admission, during daily assessment and/or before any "as needed" intervention(s);Evaluate if patient comfort function goal is met;Evaluate patient's satisfaction with pain management progress;Include patient/patient care companion in decisions related to pain management as needed   Tylenol for pain relief     Problem: Side Effects from Pain Analgesia  Goal: Patient will experience minimal side effects of analgesic therapy  Outcome: Progressing   06/20/16 1523   Goal/Interventions addressed this shift   Patient will experience minimal side effects of analgesic therapy Prevent/manage side effects per LIP orders (i.e. nausea, vomiting, pruritus, constipation, urinary retention, etc.);Assess for changes in cognitive function;Monitor/assess patient's respiratory status (RR depth, effort, breath sounds)   No side effects from Tylenol     Problem: Discharge  Barriers  Goal: Patient will be discharged home or other facility with appropriate resources  Outcome: Progressing   06/20/16 1523   Goal/Interventions addressed this shift   Discharge to home or other facility with appropriate resources Provide appropriate patient education;Provide information on available health resources;Initiate discharge planning   Possible [place] tomorrow     Problem: Psychosocial and Spiritual Needs  Goal: Demonstrates ability to cope with hospitalization/illness  Outcome: Progressing   06/20/16 1523   Goal/Interventions addressed this shift   Demonstrates ability to cope with hospitalizations/illness Encourage verbalization of feelings/concerns/expectations;Provide quiet environment;Assist patient to identify own strengths and abilities;Communicate referral to spiritual care as appropriate;Include patient/ patient care companion in decisions;Reinforce positive adaptation of new coping behaviors;Encourage participation in diversional activity;Encourage patient to set small goals for self   Family at bedside

## 2016-06-20 NOTE — Discharge Instr - AVS First Page (Addendum)
Home Health Discharge Information     Your doctor has ordered Skilled Nursing, Physical Therapy and Occupational Therapy in-home service(s) for you while you recuperate at home, to assist you in the transition from hospital to home.      The agency that you or your representative chose to provide the service:  Name of [place]: [place]. ([phone])]    SEE ACCOMPANYING PURPLE FORM IN YOUR DISCHARGE PACKET   ([place])    Please see discharge instructions and follow up with providers as ordered.

## 2016-06-20 NOTE — Progress Notes (Signed)
06/20/16 Pt informed she is a Priority Discharge for tomorrow. Provided priority discharge check list. Kandis Mannan RN, CM  Case Management  [phone]

## 2016-06-20 NOTE — PT Progress Note (Cosign Needed)
[facility]   Physical Therapy Treatment  Patient:  Jacqueline Weber MRN#:  [id number]  [facility]  Bed: FI268/FI268-01    Discharge Recommendations:   D/C Recommendations: Home with home health PT (assist/ Supervision with mobility)   DME Recommendations: Shower chair      Assessment:   Patient recent d/c from hospital. Patient presents with decreased strength,decreased endurance,decreased balance and decreased functional mobility. Patient c/o SOB with minimal exertion with sats 96% on RA. Patient will benefit from continued therapy to maximize functional independence with mobility and safety.     Treatment Activities: Bed mobility, gait training, sternal precautions,therex,safety    Educated the patient to role of physical therapy, plan of care, goals of therapy and HEP, safety with mobility and ADLs, pursed lip breathing, sternal precautions, discharge instructions, home safety.    Plan:   PT Frequency: 3-4x/wk    Continue plan of care.       Precautions and Contraindications:   Falls  Sternal    Updated Medical Status/Imaging/Labs:   Chest 2 Views   IMPRESSION:      Bibasilar atelectasis and bilateral pleural effusions left greater than  right. No obvious congestive heart failure.     NM Pulmonary Vent/Perf   IMPRESSION:    Low probability pulmonary embolism  female p/w shortness of breath and moderate epigastric abd pain since last evening       Subjective:    "I am having shortness of breathe."  Patient's medical condition is appropriate for Physical Therapy intervention at this time.  Patient is agreeable to participation in the therapy session. Nursing clears patient for therapy.    Pain:   Denied      Objective:   Patient is in bed with IV access,tele in place.    Cognition  Alert and Oriented x 4. Able to follow commands. Able to understand and speak [place]. Primarily speaks [place], daughter at times assist with translation.    Functional Mobility  Rolling:  Independent  Supine to Sit: SBA  Scooting: SBA  Sit to Stand: CGA  Stand to Sit: CGA  Transfers: CGA    Ambulation  Level of Assistance required: CGA  Ambulation Distance: 130 feet (limited due to c/o fatigue and SOB)  Pattern: small steps, decrease cadence, slight wobbly at times  Device Used:None  Stair Management: Deferred due to c/o fatigue      Balance  Static Sitting: Good  Dynamic Sitting: Good  Static Standing: Fair+  Dynamic Standing: Fair+    Therapeutic Exercises  AP<LAQ's<hip flexion    Patient Participation: Good  Patient Endurance: Fair+. O2 sats 96% throughout the session.    Patient left with call bell within reach, all needs met, SCDs on, fall mat in place, chair alarm on and all questions answered. RN notified of session outcome and patient response.     Goals:  Goals  Goal Formulation: With patient  Time for Goal Acheivement: 3 visits  Goals: Select goal  Pt Will Go Supine To Sit: independent, with supervision  Pt Will Transfer Bed/Chair: independent, with supervision  Pt Will Ambulate: > 200 feet, independent, with supervision  Pt Will Go Up / Down Stairs: 6-10 stairs, with stand by assist  Pt Will Perform Home Exer Program: independent    AM-PACT Inpatient Short Forms  Inpatient AM-PACT Performed? (PT): Basic Mobility Inpatient Short Form  AM-PACT "6 Clicks" Basic Mobility Inpatient Short Form  Turning Over in Bed: None  Sitting Down On/Standing From  Armchair: A little  Lying on Back to Sitting on Side of Bed: A little  Assist Moving to/from Bed to Chair: A little  Assist to Walk in Hospital Room: A little  Assist to Climb 3-5 Steps with Railing: A little  PT Basic Mobility Raw Score: 19  CMS 0-100% Score: 41.77%      Based on [employer] AM-PACT - Basic Mobility Inpatient (Short Form) score, functional assessment, and clinical judgment:     Mobility, Current Status (R1165): CK    Mobility, Goal Status (B9038): CI           Attention MDs:   Thank you for allowing [place] to participate in the  care of [facility]. Regulations from the Center for Medicare and Medicaid Services (CMS) require your review and approval of this plan of care.     Please cosign this note indicating you are in agreement with the PT Plan of Care so we may initiate the therapy treatment plan.     Gasper Sells  PT  Pager Number:[phone]    Time of Treatment:  PT Received On: 06/20/16  Start Time: 3338  Stop Time: 1115  Time Calculation (min): 30 min  Treatment #1 out of 3 visits

## 2016-06-20 NOTE — Progress Notes (Signed)
Pt arrived on the unit at 0657. Vital signs were done. Bed in lowest position, call bell is within reach, and floor mat in place. Will continue to monitor patient and report any changes.

## 2016-06-20 NOTE — H&P (Signed)
H&P    Chief Complaint:  SOB  History Of Present Illness:   Jacqueline Weber is a 65 y.o. 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    She presented to the ED this am at 0100 with complaints of SOB.    CXR shows tiny effusions, no atelectasis,  VQ scan shows pending.   CTA was not performed due to long standing CKD.     She is being admitted for observation.      Allergies:  Allergies   Allergen Reactions   . Mosquito (Culex Pipiens) Allergy Skin Test Shortness Of Breath     And  Trouble  breathing   . Lisinopril    . Shellfish-Derived Products Hives     New  allergy  New  allergy   . Penicillins Rash     Swelling,  numbness       Medications:   Prior to Admission medications    Medication Sig Start Date End Date Taking? Authorizing Provider   acetaminophen (TYLENOL) 325 MG tablet Take 2 tablets (650 mg total) by mouth every 4 (four) hours as needed for Pain or Fever. 06/19/16   Abram Sander [place], PA   amiodarone (PACERONE) 200 MG tablet Take 1 tablet by mouth for 10 days then stop 06/19/16   Abram Sander [place], [place]   amLODIPine (NORVASC) 10 MG tablet Take 1 tablet (10 mg total) by mouth daily. 05/19/16   Stacey Drain, MD   aspirin EC 325 MG EC tablet Take 1 tablet (325 mg total) by mouth daily. 06/04/14   Clare Gandy, MD   bumetanide (BUMEX) 1 MG tablet Take one-half tablets (0.5 mg total) by mouth 2 (two) times daily. 06/19/16   Abram Sander [place], PA   carvedilol (COREG) 25 MG tablet Take 25 mg by mouth 2 (two) times daily with meals.       [provider]   cloNIDine (CATAPRES) 0.2 MG tablet Take 1 tablet (0.2 mg total) by mouth 2 (two) times daily. 06/19/16   Abram Sander [place], PA   famotidine (PEPCID) 20 MG tablet Take 1 tablet (20 mg total) by mouth daily. 10/01/15   Sadie Haber, MD   hydrALAZINE (APRESOLINE) 100 MG tablet Take 1 tablet (100 mg total) by mouth 3 (three) times daily. 06/19/16   Abram Sander [place], PA   senna-docusate (PERICOLACE) 8.6-50 MG per tablet Take 2 tablets by mouth 2 (two) times daily. 06/19/16   Abram Sander [place], PA   simvastatin (ZOCOR) 10 MG tablet Take 10 mg by mouth nightly.    [provider]   SITagliptin (JANUVIA) 25 MG tablet Take 1 tablet (25 mg total) by mouth daily. 05/22/16   Stacie Glaze, NP   SITagliptin (JANUVIA) 25 MG tablet Take 1 tablet (25 mg total) by mouth daily. 06/20/16   Abram Sander [place], PA   traMADol (ULTRAM) 50 MG tablet Take 1 tablet (50 mg total) by mouth every 8 (eight) hours as needed (pain). 06/19/16   Rico Sheehan, PA   vitamin D, ergocalciferol, (DRISDOL) 50000 UNIT Cap Take 2,000 Units by mouth 2 (two) times daily.    [provider]        Past Medical History:  Past Medical History:   Diagnosis Date   . Cataracts, bilateral    . Cerebrovascular accident    . Chronic kidney disease    . CVA (cerebral infarction) 03/2013   .  Diabetes mellitus    . Hypercholesteremia    . Hypertension    . Myocardial infarction         Past Surgical History:  Past Surgical History:   Procedure Laterality Date   . CORONARY ARTERY BYPASS N/A 06/12/2016    Procedure: Coronary Artery Bypass x 4.;  Surgeon: Bjorn Loser, MD;  Location: [facility] HEART OR;  Service: Cardiothoracic;  Laterality: N/A;  LIMA to LDA  Vein to PDA  Vein to OM  Vein to Diagonal   . ENDOSCOPIC,VEIN HARVEST N/A 06/12/2016    Procedure: Endoscopic, Left Leg Greater Saphenous Vein Harvest;  Surgeon: Bjorn Loser, MD;  Location: [facility] HEART OR;  Service: Cardiothoracic;  Laterality: N/A;  Left Leg Incision (medially, groin to ankle) @ 0742  Vein out of leg @ 0826  Vein prep complete @ 0845  Total time = 63 minutes        Family History:  Family History   Problem Relation Age of Onset   . No known problems Mother    . No known problems Father         Social History:  History   Alcohol Use No      History   Smoking Status   . Never Smoker    Smokeless Tobacco   . Never Used        ROS:  A review of systems is negative or non contributory except for those listed in the HPI and the following:       Labs/Diagnostics:  CBC  Recent Labs      06/20/16   0159  06/19/16   0600   WBC  11.00*  8.08   Hgb  10.8*  9.6*   Hematocrit  33.9*  30.2*   Platelets  299  227     BMP  Recent Labs      06/20/16   0159  06/19/16   0559  06/18/16   0405   Sodium  128*  132*  132*   Potassium  4.7  4.5  4.6   Chloride  98*  103  104   CO2  24  22  23    BUN  40.0*  41.0*  47.0*   Creatinine  2.0*  2.2*  2.3*   Glucose  171*  136*  132*   Magnesium   --   2.2  1.9   Calcium  8.4*  7.9*  7.7*   Phosphorus   --   2.9  3.0     LFTs  Recent Labs      06/20/16   0158   AST (SGOT)  18   ALT  12   Alkaline Phosphatase  57   Bilirubin, Total  0.5   Bilirubin, Direct  0.2   Bilirubin, Indirect  0.3   Protein, Total  5.6*   Albumin  2.8*         Physicial Exam:  Vitals: Blood pressure 143/57, pulse 60, temperature 98.4 F (36.9 C), temperature source Temporal Artery, resp. rate 15, SpO2 97 %.      Neuro: Intact, MAE's, follows commands  Cardiac: normal S1/S2, no murmur  Lungs: good aeration bilat  Abdomen: soft, NT/ND, active BS  Extremities: warm and well perfused, palpable pulses  Wounds: C/D/I            Assessment and Plan:  HTN - continue home medications  CKD - continue bumex  SOB - still c/o SOB, CXR clear, RA O2 95%.  Pain - continue pains meds.    Abram Sander, PA-C

## 2016-06-20 NOTE — ED Provider Notes (Signed)
[facility] EMERGENCY DEPARTMENT ATTENDING PHYSICIAN NOTE     CLINICAL SUMMARY          Diagnosis:    .     Final diagnoses:   Shortness of breath         Disposition:       ED Disposition     ED Disposition Condition Date/Time Comment    Observation  Tue Jun 20, 2016  4:13 AM Admitting Physician: [facility], [place]   Diagnosis: Shortness of breath [786.05.ICD-9-CM]   Estimated Length of Stay: < 2 midnights   Tentative Discharge Plan?: Home or Self Care [1]   Patient Class: Observation [104]               Discharge Prescriptions     None                                            CLINICAL INFORMATION        HPI:       65 y.o. female p/w shortness of breath and moderate epigastric abd pain since last evening (10/16). Denies fever.     History obtained from:  Patient and family at bedside      ROS:      All systems are negative except as noted in the HPI.        Physical Exam:      Pulse 68  BP (!) 210/90  Resp 22  SpO2 97 %  Temp 98.4 F (36.9 C)    Nursing note and vitals reviewed.    Constitutional:  Comfortable. Short sentences.   Psych:  Normal affect. Cooperative.   Eyes: No conjunctival injection. No discharge.  Ears, Nose, Mouth and Throat:Tolerating secretions.  No hoarseness.  Respiratory: Tachypneic. Faint rales at bases b/l.   Cardiovascular: No murmurs. Regular rhythm.   Abdomen: Soft. Non-tender    Musculoskeletal:           Neck:           Back:            Upper Extremity:           Lower Extremity:   Neurological:   Integumentary:   GU:   Lymphatic:                PAST HISTORY        Primary Care Provider: John Giovanni, MD        PMH/PSH:      [name] has a past medical history of Cataracts, bilateral; Cerebrovascular accident; Chronic kidney disease; CVA (cerebral infarction) (03/2013); Diabetes mellitus; Hypercholesteremia; Hypertension; and Myocardial infarction.  She has a past surgical history that includes CORONARY ARTERY BYPASS (N/A, 06/12/2016) and ENDOSCOPIC,VEIN  HARVEST (N/A, 06/12/2016).      Social/Family History:      She reports that she has never smoked. She has never used smokeless tobacco. She reports that she does not drink alcohol or use drugs.  Her family history includes No known problems in her father and mother.      Listed Medications on Arrival:    .     Home Medications     Med List Status:  Complete Set By: Van Clines, RN at 06/20/2016  2:17 AM                acetaminophen (TYLENOL) 325 MG tablet  Take 2 tablets (650 mg total) by mouth every 4 (four) hours as needed for Pain or Fever.     amiodarone (PACERONE) 200 MG tablet     Take 1 tablet by mouth for 10 days then stop     amLODIPine (NORVASC) 10 MG tablet     Take 1 tablet (10 mg total) by mouth daily.     aspirin EC 325 MG EC tablet     Take 1 tablet (325 mg total) by mouth daily.     bumetanide (BUMEX) 1 MG tablet     Take one-half tablets (0.5 mg total) by mouth 2 (two) times daily.     carvedilol (COREG) 25 MG tablet     Take 25 mg by mouth 2 (two) times daily with meals.        cloNIDine (CATAPRES) 0.2 MG tablet     Take 1 tablet (0.2 mg total) by mouth 2 (two) times daily.     famotidine (PEPCID) 20 MG tablet     Take 1 tablet (20 mg total) by mouth daily.     hydrALAZINE (APRESOLINE) 100 MG tablet     Take 1 tablet (100 mg total) by mouth 3 (three) times daily.     senna-docusate (PERICOLACE) 8.6-50 MG per tablet     Take 2 tablets by mouth 2 (two) times daily.     simvastatin (ZOCOR) 10 MG tablet     Take 10 mg by mouth nightly.     SITagliptin (JANUVIA) 25 MG tablet     Take 1 tablet (25 mg total) by mouth daily.     SITagliptin (JANUVIA) 25 MG tablet     Take 1 tablet (25 mg total) by mouth daily.     traMADol (ULTRAM) 50 MG tablet     Take 1 tablet (50 mg total) by mouth every 8 (eight) hours as needed (pain).     vitamin D, ergocalciferol, (DRISDOL) 50000 UNIT Cap     Take 2,000 Units by mouth 2 (two) times daily.        Allergies: She is allergic to mosquito (culex pipiens) allergy  skin test; lisinopril; shellfish-derived products; and penicillins.            VISIT INFORMATION        Clinical Course & Medical Decision Making:      At 4:17 AM:  65 y.o. female p/w SOB s/p CABG X 4 8 days ago.  In ER, pt is afebrile, tachypneic and satting well on RA.  Exam and BNP consistent w/ mild pulmonary edema.  Will give IV lasix.  In addition, given her recent hospitalization and surgery, there is some concern here for a PE.  Will get V/Q scan and admit for further w/u. Case d/w DR Candiss Norse (cardiac surgeon) who will admit the pt.      At 4:35 AM:  D/w DR Bridgett Larsson (radiologist) who states V/Q scan is low-probability for PE.        Medications Given in the ED:    .     ED Medication Orders     Start Ordered     Status Ordering Provider    06/20/16 0413 06/20/16 0412  furosemide (LASIX) injection 40 mg  Once     Route: Intravenous  Ordered Dose: 40 mg     Acknowledged Haasini Patnaude    06/20/16 0413 06/20/16 0412  aspirin chewable tablet 162 mg  Once     Route: Oral  Ordered Dose: 162 mg  Acknowledged Caycee Wanat            Procedures:      Procedures      Interpretations:       O2 sat-           saturation: 97 %; Oxygen use: room air; Interpretation: Normal    EKG interpretation:   Reviewed and interpreted by Dr. Vanna Scotland at 3:23 AM  Rate: Normal  Rhythm:Sinus  Axis: Normal  PR Interval:  Normal  QRS Interval: Normal  QT Interval:  Normal  T Waves:  No inversions. No flattening.  ST Segments: No elevations or depressions.  Q Waves: None  Overall Impression:  Non-specific.                      RESULTS        Lab Results:      Results     Procedure Component Value Units Date/Time    Hepatic function panel (LFT) [[id number]]  (Abnormal) Collected:  06/20/16 0158    Specimen:  Blood Updated:  06/20/16 0412     Bilirubin, Total 0.5 mg/dL      Bilirubin, Direct 0.2 mg/dL      Bilirubin, Indirect 0.3 mg/dL      AST (SGOT) 18 U/L      ALT 12 U/L      Alkaline Phosphatase 57 U/L      Protein, Total 5.6 (L) g/dL       Albumin 2.8 (L) g/dL      Globulin 2.8 g/dL      Albumin/Globulin Ratio 1.0    Lipase [[id number]] Collected:  06/20/16 0158    Specimen:  Blood Updated:  06/20/16 0412     Lipase 41 U/L     B-type Natriuretic Peptide [[id number]]  (Abnormal) Collected:  06/20/16 0158    Specimen:  Blood Updated:  06/20/16 0357     B-Natriuretic Peptide 673 (H) pg/mL     Basic Metabolic Panel [[id number]]  (Abnormal) Collected:  06/20/16 0159    Specimen:  Blood Updated:  06/20/16 0245     Glucose 171 (H) mg/dL      BUN 40.0 (H) mg/dL      Creatinine 2.0 (H) mg/dL      Calcium 8.4 (L) mg/dL      Sodium 128 (L) mEq/L      Potassium 4.7 mEq/L      Chloride 98 (L) mEq/L      CO2 24 mEq/L     GFR [[id number]] Collected:  06/20/16 0159     Updated:  06/20/16 0245     EGFR 25.1    i-Stat Troponin [[id number]] Collected:  06/20/16 0212     Updated:  06/20/16 0224     i-STAT Troponin 0.06 ng/mL     CBC with differential [[id number]]  (Abnormal) Collected:  06/20/16 0159    Specimen:  Blood from Blood Updated:  06/20/16 0216     WBC 11.00 (H) x10 3/uL      Hgb 10.8 (L) g/dL      Hematocrit 33.9 (L) %      Platelets 299 x10 3/uL      RBC 4.67 x10 6/uL      MCV 72.6 (L) fL      MCH 23.1 (L) pg      MCHC 31.9 (L) g/dL      RDW 21 (H) %      MPV 10.5 fL      Neutrophils 73.9 %  Lymphocytes Automated 11.4 %      Monocytes 9.2 %      Eosinophils Automated 3.4 %      Basophils Automated 0.3 %      Immature Granulocyte 1.8 %      Nucleated RBC 0.3 /100 WBC      Neutrophils Absolute 8.14 (H) x10 3/uL      Abs Lymph Automated 1.25 x10 3/uL      Abs Mono Automated 1.01 x10 3/uL      Abs Eos Automated 0.37 x10 3/uL      Absolute Baso Automated 0.03 x10 3/uL      Absolute Immature Granulocyte 0.20 (H) x10 3/uL      Absolute NRBC 0.03 (H) x10 3/uL               Radiology Results:      Chest 2 Views    (Results Pending)   NM Pulmonary Vent/Perf (VQ Scan)    (Results Pending)               Scribe Attestation:      I was acting as a Education administrator for Vanna Scotland, MD  on [place]  Treatment Team: Scribe: Druckenbrod, Rodman Key     I am the first provider for this patient and I personally performed the services documented. Treatment Team: Scribe: Druckenbrod, Rodman Key is scribing for me on [place]. This note and the patient instructions accurately reflect work and decisions made by me.  Vanna Scotland, MD             Vanna Scotland, MD  06/20/16 [phone]

## 2016-06-20 NOTE — Progress Notes (Signed)
Home Health Referral          Referral from Kandis Mannan (Case Manager) for home health care upon discharge.    By [employer], the patient has the right to freely choose a home care provider.  Arrangements have been made with:     A company of the patients choosing. We have supplied the patient with a listing of providers in your area who asked to be included and participate in Medicare.   [facility], a home care agency that provides both adult home care services which is a wholly owned and operated by [employer] and participates in [employer]   The preferred provider of your insurance company. Choosing a home care provider other than your insurance company's preferred provider may affect your insurance coverage.    The Home Health Care Referral Form acknowledging the voluntary selection of the home care company has been completed, signed, and is on file.      Home Health Discharge Information     Your doctor has ordered Skilled Nursing, Physical Therapy and Occupational Therapy in-home service(s) for you while you recuperate at home, to assist you in the transition from hospital to home.      The agency that you or your representative chose to provide the service:  Name of [place]: [place]. ([phone])]       The above services were set up by:  Mosie Epstein  (Portal)   Phone                    [phone]      Additional comments: Confirmed with agency that patient is ROC and that no discharge order is placed at this time.       Signed by: Mosie Epstein  Date Time: 06/20/16 12:48 PM

## 2016-06-20 NOTE — Telephone Encounter (Signed)
Did not call pt, pt has been re-admitted to hospital as of 10.17.2017.

## 2016-06-20 NOTE — Progress Notes (Signed)
Readmission huddle held in ED regarding Medicare focused diagnosis patient.    Physician/Midlevel: Dr. Laveda Norman  Time: 3:19am    Precious Gilding, MSW  Social Worker  Case Management Department  [facility]  [phone]

## 2016-06-21 ENCOUNTER — Other Ambulatory Visit: Payer: Self-pay

## 2016-06-21 ENCOUNTER — Observation Stay: Payer: Medicare Other

## 2016-06-21 LAB — BASIC METABOLIC PANEL
BUN: 35 mg/dL — ABNORMAL HIGH (ref 7.0–19.0)
CO2: 27 mEq/L (ref 22–29)
Calcium: 8.6 mg/dL (ref 8.5–10.5)
Chloride: 101 mEq/L (ref 100–111)
Creatinine: 2.3 mg/dL — ABNORMAL HIGH (ref 0.6–1.0)
Glucose: 101 mg/dL — ABNORMAL HIGH (ref 70–100)
Potassium: 5.3 mEq/L — ABNORMAL HIGH (ref 3.5–5.1)
Sodium: 132 mEq/L — ABNORMAL LOW (ref 136–145)

## 2016-06-21 LAB — GFR: EGFR: 21.4

## 2016-06-21 LAB — GLUCOSE WHOLE BLOOD - POCT
Whole Blood Glucose POCT: 116 mg/dL — ABNORMAL HIGH (ref 70–100)
Whole Blood Glucose POCT: 215 mg/dL — ABNORMAL HIGH (ref 70–100)
Whole Blood Glucose POCT: 126 mg/dL — ABNORMAL HIGH (ref 70–100)

## 2016-06-21 NOTE — Final Progress Note (DC Note for stay less than 48 (Signed)
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 Patient was admitted for further work up which was unable to identify etiology. Patient symptoms resolved spontaneously same night without intervention. Patient was non hypoxic, during admission and evaluation with stable vital signs. Will be discharged today home following uneventful course with spontaneous resolution of symptoms without treatment.  Inpatient work up yielded low probability of the following.  Pleural infiltrates, congestion, or PTX  Pulmonary Embolus  Pericardial Effusion/Tamponade, or Systolic Heart Failure  Myocardial Infarction   Will be discharged home today with close follow up with following Assessment and Plan.  NEURO:   Intact, ambulating.   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 LVEF 75%   EKG 10/17: unrevealing of ACS   Pacing wires: none   Core Measures: Beta blocker: yes  Statin: yes  ACE: no, AKI/CKD  ASA: yes    PULMONARY:   RA   CTA, unlabored     Chest xray: Bibasilar atelectasis, and pleural effusions L>R, no pul congestion   Acute Shortness of Breath: Resolved spontaneously, unclear etiology    Atelectasis: Encourage IS q1h, ambulation   FeV1 Preoperatively 72%   No evidence of chronic lung disease   Non-smoker   R/O Pulmonary Embolus:    VQ Scan 10/17: Low probability, unable to do CTA do to underlying CKD.        GI:   Cardiac, diabetic diet   No acute or chronic issues at this time   Pepcid 20  daily, GI prophylaxis.   Bowel regimen    RENAL:   CKD, Baseline 2.2-2.4   Bumex 0.5 BID   Cr 2.3 this AM   Non oliguric   Will have close follow up with BMP in 4 days.      HEME:   HCT 33, PLT 299   LE dopplers, negative   DVT Prophylaxis: heparin/SCD    ID:   Afebrile   WBC 11   Lines: none   Cultures: none indicated     ENDO:    HBA1C 6.3   DM II, on Januvia and SSI    DISPOSITION:     Home today, with follow up in 1 week   Check BMP on 10/22 for follow up on renal function    SUBJECTIVE:     No complaints, denies SOB/DOE    OBJECTIVE:    VITALS LAST 24 HOURS:  Temp:  [98.2 F (36.8 C)-99.5 F (37.5 C)] 98.7 F (37.1 C)  Heart Rate:  [58-78] 65  Resp Rate:  [16-20] 16  BP: (117-177)/(56-95) 122/57  O2 saturation: 95% on RA  Rhythm: NSR    PHYSICAL EXAM:     Neuro: Alert, intact, NAD, MAE   Cardiac: RRR   Lungs: CTA   Abdomen: Soft NT, + BS, rounded   Extremities: warm and well perfused, without edema, +2 distal pulses  Incisions: Sternum stable, approximated without complication, open to air.    RECENT LABS:    CBC:  Recent Labs      06/20/16   0159  06/19/16   0600   WBC  11.00*  8.08   Hgb  10.8*  9.6*   Hematocrit  33.9*  30.2*       BMP:  Recent Labs      06/21/16   0543  06/20/16   0159  06/19/16   0559   Sodium  132*  128*  132*   Potassium  5.3*  4.7  4.5   Chloride  101  98*  103   CO2  27  24  22    BUN  35.0*  40.0*  41.0*   Creatinine  2.3*  2.0*  2.2*   Glucose  101*  171*  136*   Magnesium   --    --   2.2   Calcium  8.6  8.4*  7.9*   Phosphorus   --    --   2.9       LFTs:  Recent Labs      06/20/16   0158   AST (SGOT)  18   ALT  12   Alkaline Phosphatase  57   Bilirubin, Total  0.5   Bilirubin, Direct  0.2   Bilirubin, Indirect  0.3   Protein, Total  5.6*   Albumin  2.8*       INR:  No results for input(s): PT, INR in the last 72 hours.      MEDICATIONS:    Current Facility-Administered Medications   Medication Dose Route Frequency   . amLODIPine  10 mg Oral Daily   .  bumetanide  0.5 mg Oral BID   . carvedilol  25 mg Oral BID Meals   . cloNIDine  0.2 mg Oral BID   . famotidine  20 mg Oral Daily   . heparin (porcine)  5,000 Units Subcutaneous Q8H [place]   . hydrALAZINE  100 mg Oral TID   . polyethylene glycol  17 g Oral Daily   . senna-docusate  2 tablet Oral BID   . simvastatin  10 mg Oral QHS   . SITagliptin  25 mg Oral Daily         INTAKE/OUTPUT:     Intake and Output Summary (Last 24 hours) at Date Time        Intake/Output Summary (Last 24 hours) at 06/21/16 1500  Last data filed at 06/21/16 0500   Gross per 24 hour   Intake              120 ml   Output                0 ml   Net              120 ml       Signed by Ave Filter ACNP

## 2016-06-21 NOTE — Progress Notes (Signed)
06/21/16 Discharge Summary  Pt ready for d/c home today with family transporting pt via private car. Family will be available to assist pt at home. [facility] will provide HH:SN, OT, PT services. Has cardio-thoracic f/u appt. on 06/26/16 @ 1430; cardiology f/u app.t with Dr. Eual Fines on 07/03/16 @ 1000; PCP f/u appt. with Dr. Fredna Dow on 07/10/16 @ 0830. Pt to call Nephrologist office, Dr. Ivar Bury and schedule hospital follow up appointment.   Kandis Mannan RN, [place]  Case Management  #[id number]     06/21/16 1418   Discharge Disposition   Patient preference/choice provided? Yes   Physical Discharge Disposition Home with Needs   Name of [place] up time afternoon   Patient/Family/POA notified of transfer plan Yes   Patient agreeable to discharge plan/expected d/c date? Yes   Family/POA agreeable to discharge plan/expected d/c date? Yes   Bedside nurse notified of transport plan? Yes   Outpatient Services   Home Health Skilled Nursing   CM Interventions   Follow up appointment scheduled? Yes   Follow up appointment scheduled with: PCP;Other (comment)   Referral made for home health RN visit? Yes   Multidisciplinary rounds/family meeting before d/c? Yes   Medicare Checklist   Is this a Medicare patient? Yes   Patient received 1st IMM Letter? n/a   3 midnight inpatient qualifying stay (SNF only) No

## 2016-06-21 NOTE — PT Progress Note (Signed)
[facility]   Physical Therapy Treatment  Patient:  Jacqueline Weber MRN#:  [id number]  [facility]  Bed: FI268/FI268-01    Discharge Recommendations:   D/C Recommendations: Home with home health PT (assist/ Supervision with mobility)   DME Recommendations: Shower chair        Assessment:   Patient progressing with therapy. Patient able to ambulate increased distance today and also did stairs,but required CGA for balance as at times slight wobbly during ambulation. Patient educated for pacing and taking rest breaks on stairs. O2 sats 95% throughout the session. Requires cues for sternal precautions at times with activity. Will benefit from continued therapy to maximize functional independence with mobility and safety.     Treatment Activities: gait training, stairs training, therex,pacing, PLB, sternal precautions (forgetful at times)    Educated the patient to role of physical therapy, plan of care, goals of therapy and HEP, safety with mobility and ADLs, energy conservation techniques, pursed lip breathing, sternal precautions, discharge instructions, home safety.    Plan:   PT Frequency: 3-4x/wk    Continue plan of care.       Precautions and Contraindications:   Falls  Sternal  Laotian speaking-but able to understand and speak minimal [place], daughter assist with translation as needed    Updated Medical Status/Imaging/Labs:   Echocardiogram Adult Complete W Clr/ Dopp Waveform  SUMMARY:     -  Left ventricle:  -  Size was normal.  -  Systolic function was hyperdynamic. Ejection fraction was estimated to be 75  %.  -  There were no regional wall motion abnormalities.  -  Wall thickness was at the upper limits of normal.  -  The ratio of early ventricular filling to atrial contraction velocities was  within the normal range. -  Features were consistent with a pseudonormal left  ventricular filling pattern, with concomitant abnormal relaxation and increased  filling pressure  (grade 2 diastolic dysfunction).  -  Doppler parameters were consistent with high ventricular filling pressure.     -  Right ventricle:  -  The size was normal.  -  Systolic function was low normal.  -  Wall thickness was normal.  -  Systolic pressure was mildly to moderately increased. Estimated peak  pressure was 45 mmHg.     -  Pericardium:  -  There was no pericardial effusion.  -  There was a moderate-sized left pleural effusion.      Subjective: "I am feeling better than yesterday."     Patient's medical condition is appropriate for Physical Therapy intervention at this time.  Patient is agreeable to participation in the therapy session. Family and/or guardian are agreeable to patient's participation in the therapy session. Nursing clears patient for therapy.    Pain:   Denied      Objective:   Patient is in bed with IV access, tele in place. Daughter at bedside.    Cognition  Alert and Able to follow commands. Slight forgetful.    Functional Mobility  Rolling: NT  Supine to Sit: NT. Received OOB to chair  Scooting: Independent  Sit to Stand: Independent  Stand to Sit: Independent  Transfers: CGA    Ambulation  Level of Assistance required: CGA mainly with increased distance  Ambulation Distance: 300 feet  Pattern: small steps, slight wobbly at times (per daughter, due to medication),decrease cadence,cues for pacing and PLB  Device Used: None  Weightbearing Status: FWB BLE  Stair Management:  8 steps with one rail (for support); CGA/Min.assist with cues for stop and rest in between stairs      Balance  Static Sitting: Good  Dynamic Sitting: Good  Static Standing: Fair+  Dynamic Standing: Fair    Therapeutic Exercises  AP<LAQ's<hip flexion:10 reps. Left leg weakness at baseline(per daughter)    Patient Participation: Good  Patient Endurance: Good-    Patient left with call bell within reach, all needs met, SCDs on, fall mat in place, chair alarm off as found (RN aware) and all questions answered. RN notified of  session outcome and patient response.     Goals:  Goals  Goal Formulation: With patient  Time for Goal Acheivement: 3 visits  Goals: Select goal  Pt Will Go Supine To Sit: independent, with supervision  Pt Will Transfer Bed/Chair: independent, with supervision  Pt Will Ambulate: > 200 feet, independent, with supervision  Pt Will Go Up / Down Stairs: 6-10 stairs, with stand by assist  Pt Will Perform Home Exer Program: independent      Gasper Sells  PT  Pager [place]    Time of Treatment:  PT Received On: 06/21/16  Start Time: 1130  Stop Time: 1200  Time Calculation (min): 30 min  Treatment # 1 out of 3 visits

## 2016-06-21 NOTE — UM Notes (Signed)
Observation CSR    10/18 T 98.5, HR 64, RR 18, bp 154/70, Pox 94% RA.  Tele: NSR    10/18 Na 132, K 5.3, BUN 35.0, Cr 2.3    10/18 Remains on CVSD  Cardiac tele  Bumex 5mg  po bid    Per CCM: patient for priority discharge 06/21/16          Eloy End, BSN, RN, ACM          Utilization Review Case Manager  [facility]  ph: [phone]

## 2016-06-21 NOTE — Plan of Care (Signed)
A&Ox4. SR on tele. On room air. Tolerating diet. Diuresing well. No complaints of SOB. OOB ad lib. Pt will be d/c'd today. Family at bedside. Fall and safety precautions maintained. Will continue to monitor.

## 2016-06-21 NOTE — Plan of Care (Signed)
Problem: Moderate/High Fall Risk Score >5  Goal: Patient will remain free of falls  Outcome: Adequate for Discharge      Problem: Safety  Goal: Patient will be free from injury during hospitalization  Outcome: Adequate for Discharge    Goal: Patient will be free from infection during hospitalization  Outcome: Adequate for Discharge      Problem: Pain  Goal: Pain at adequate level as identified by patient  Outcome: Adequate for Discharge      Problem: Side Effects from Pain Analgesia  Goal: Patient will experience minimal side effects of analgesic therapy  Outcome: Adequate for Discharge      Problem: Discharge Barriers  Goal: Patient will be discharged home or other facility with appropriate resources  Outcome: Adequate for Discharge      Problem: Psychosocial and Spiritual Needs  Goal: Demonstrates ability to cope with hospitalization/illness  Outcome: Adequate for Discharge      Comments:     Pt is AAOX3. Pt does not speak english, daughter is at bedside to translate, and translate form in pt's chart. NSR on tele, HR 60s. 97% on RA. Tolerating cc and renal diet. Negative for BM; voiding WNL. OOB with assist x1. Pt has walked the hallways with assistance 3 times. Midsternal incision is CDI, OTA. Pt denies pain. Daughter is at bedside for the night. Bed in lowest position, call bell is within reach, and floor mat in place. Will continue to monitor patient and report any changes.

## 2016-06-21 NOTE — Discharge Summary -  Nursing (Signed)
Reviewed the following with patient and daughter: medications and when their next doses are due, follow up appts, BMP before CV surgery appt., increasing activity, s/s of when to call MD vs 911. Pt and daughter verbalized understanding. All questions answered. Pt's daughter stated they have all medications except one which they will have to contact PCP for rx. Pt left unit safely with all belongings. Tele and IV's d/c'd with cannula intact.

## 2016-06-23 ENCOUNTER — Telehealth (INDEPENDENT_AMBULATORY_CARE_PROVIDER_SITE_OTHER): Payer: Self-pay | Admitting: Internal Medicine

## 2016-06-23 ENCOUNTER — Encounter (INDEPENDENT_AMBULATORY_CARE_PROVIDER_SITE_OTHER): Payer: Self-pay

## 2016-06-23 NOTE — Progress Notes (Signed)
TCM Medicare Navigator    06/23/16 - TCM Medicare Navigator made outreach to Dr. John Giovanni @ [phone] and spoke to [facility]. Navigator reported MFN role, and reasoning for call. Navigator noted pt has upcoming appt scheduled on November 7th for post surgery f/u.    Navigator made outreach to [facility] @ [phone] and spoke to [place]. Navigator learned the pt is assigned to [place]. Navigator will continue to f/u per protocol.    Amanda Pea, [place] Medicare Focus Navigator  [phone]

## 2016-06-23 NOTE — Telephone Encounter (Addendum)
Dr. Truman Hayward,    [facility] - Berneta Sages as a Medicare Navigator is keeping track of patient for the next 30 days. Berneta Sages can be reached at [phone], he is keeping in contact with Home Health Nurse.    Thanks,    Lelan Pons

## 2016-06-26 ENCOUNTER — Telehealth (INDEPENDENT_AMBULATORY_CARE_PROVIDER_SITE_OTHER): Payer: Self-pay

## 2016-06-26 NOTE — Telephone Encounter (Addendum)
TCM Medicare Navigator    06/26/16 - TCM Medicare Navigator made outreach to [facility] @ [phone] and spoke to nurse CM - Sunday Spillers. Navigator left contact information, and noted nurse reports she will provide call-back.     ADDENDUM - Navigator noted ROS started on 06/26/16, assigned nurse will meet with the pt today. Navigator noted pt had doctor appt scheduled. Pt utilizing OT. Pt remains stable and is "fine."      Plan of Action  Navigator will f/u within 1 wk to evaluate pt status.    Amanda Pea, [place] Medicare Focus Navigator  [phone]

## 2016-06-28 ENCOUNTER — Telehealth (INDEPENDENT_AMBULATORY_CARE_PROVIDER_SITE_OTHER): Payer: Self-pay

## 2016-06-28 NOTE — Telephone Encounter (Addendum)
TCM Medicare Navigator    06/28/16 - TCM Medicare Navigator made outreach to [facility] @ [phone] and spoke to the receptionist and noted the listed CM is not available - provided contact information.    Navigator made outreach to Dr. John Giovanni @ [phone] and spoke to [place]. Navigator confirmed pt has upcoming appt scheduled on July 10, 2016.    ADDENDUM  TCM Medicare Focus Diagnosis Navigator  Initial Call    Call participant, relationship to patient and contact information: [facility] @ [phone] (inbound call received from nurse CM - Sunday Spillers).    Name and number of TCM Medicare Navigator provided (Y/N): YES    Informed of role of TCM Medicare Navigator and duration of services provided (Y/N): YES      Patient [facility] Admission Date: 06/20/16    Hospital Discharge Date: 06/21/16    Index Admission Diagnosis: CABG    Hospital Discharge Disposition: Home w/Needs    Agencies Involved in Care: [facility] [place] and Physician Involvement    PCP Name and Number: Dr. John Giovanni @ [phone]    PCP's office notified telephonically and in writing of patient's hospitalization, current level of care, and Navigator involvement (Y/N):   Yes    Index Diagnosis Specialist Name and Number: Dr. Candiss Norse @ [phone]    48 Hour Touch Provider: [place]     48 Hour Touch Scheduled Date: 06/26/16        Teach Back    For Service Provider:      Was "[place]'s 39 Day Commitment to Care" Document explained and provided by TCM Medicare Navigator (Y/N): Yes - sent to CM via e-mail.    Medications    For Service Provider:      Did patient obtain all medications after hospital discharge? (Y/N): Yes    Was a medication reconciliation completed after discharge by service provider? (Y/N): Yes    Are there concerns or questions about patient's medications? (Y/N): No    Follow Up    Follow up needs: No needs identified per case manager.    Next follow  up call scheduled: 07/03/16      Comments    06/28/16 - TCM Medicare Navigator received inbound call from CM. Navigator noted pt is doing "better" has all meds, no urgent concerns . Pt has social supports, and utilizes visiting nurse. Navigator confirmed nurse direct fax line @ [phone], and obtained contact information for specialist (Cardiologist Dr. Candiss Norse @ [phone]). Pt was seen via cardiologist on Monday. Navigator will continue to f/u per protocol.      Plan of Action  Navigator will f/u within 1 wk to evaluate pt status    Amanda Pea, [place] Medicare Focus Navigator  [phone]

## 2016-06-29 MED FILL — Phenylephrine-NaCl Pref Syr 0.5 MG/5ML-0.9% (100 MCG/ML): INTRAVENOUS | Qty: 5 | Status: AC

## 2016-07-03 ENCOUNTER — Telehealth (INDEPENDENT_AMBULATORY_CARE_PROVIDER_SITE_OTHER): Payer: Self-pay

## 2016-07-03 NOTE — Telephone Encounter (Signed)
TCM Medicare Navigator    07/03/16 - TCM Medicare Navigator made outreach to [facility] @ [phone] and spoke to [place]. Navigator noted assigned CM Sunday Spillers) is not available and left VM with contact information.    Amanda Pea, [place] Medicare Focus Navigator  [phone]

## 2016-07-04 ENCOUNTER — Telehealth (INDEPENDENT_AMBULATORY_CARE_PROVIDER_SITE_OTHER): Payer: Self-pay | Admitting: Internal Medicine

## 2016-07-04 ENCOUNTER — Encounter (INDEPENDENT_AMBULATORY_CARE_PROVIDER_SITE_OTHER): Payer: Self-pay | Admitting: Internal Medicine

## 2016-07-04 ENCOUNTER — Ambulatory Visit (INDEPENDENT_AMBULATORY_CARE_PROVIDER_SITE_OTHER): Payer: Self-pay | Admitting: Cardiovascular Disease

## 2016-07-04 ENCOUNTER — Ambulatory Visit (FREE_STANDING_LABORATORY_FACILITY): Payer: Medicare Other | Admitting: Internal Medicine

## 2016-07-04 VITALS — BP 122/77 | HR 64 | Temp 97.9°F | Resp 14 | Ht 60.0 in | Wt 141.0 lb

## 2016-07-04 DIAGNOSIS — R6883 Chills (without fever): Secondary | ICD-10-CM

## 2016-07-04 DIAGNOSIS — R1110 Vomiting, unspecified: Secondary | ICD-10-CM

## 2016-07-04 DIAGNOSIS — R5082 Postprocedural fever: Secondary | ICD-10-CM

## 2016-07-04 NOTE — Progress Notes (Signed)
Subjective:      Date: 07/04/2016 5:42 PM   Patient ID: Jacqueline Weber is a 65 y.o. female.  Nursing Documentation:  Limb alert status: Patient asked and denied any limb restrictions for blood pressure/blood draws.  Has the patient seen any other providers since their last visit: no  The patient is due for nothing at this time, HM is up-to-date.  Chief Complaint:  Chief Complaint   Patient presents with   . Chest Pain     er f/u       Here for follow-up from recent hospitalization.   Inpatient facility: [facility]  Date of admission: September 9  Date of discharge: October 19  Discharge diagnosis: chest pain  Hospital course: CABG  Prior hospitalization within past 30 days: Yes   Prior hospitalization within past 6 months: Yes  Comorbid conditions: hypertension  Medical procedure(s) performed: IV hydration  Surgical procedure(s) performed: CABG  Diagnostic tests performed: chest x-ray and CT chest  Pending tests: none  Home care: family assisting with home health needs  Specialist follow-up: cardiology  Post-hospital monitoring: monitoring for recurrent fever  Mobility status: uses walker for assistance     Seen by cardiology today  Has chills, no measurable fever  Has been vomiting, a couple of days ago, once last night and once this morning  No diarrhea, abd pain  Eating canned soup, oatmeal, steamed vegetables, fruit, fish, chicken.    Medication Reconciliation(required):  Hospital discharge medications reviewed and reconciled with current medication list.      Problem List:  Patient Active Problem List   Diagnosis   . Type 2 diabetes mellitus without complication   . HTN (hypertension) urgency   . Hyperlipidemia   . History of CVA (cerebrovascular accident)   . Elevated brain natriuretic peptide (BNP) level   . Hyponatremia   . Renal insufficiency   . NSTEMI (non-ST elevated myocardial infarction)       Current Medications:  Current Outpatient Prescriptions   Medication Sig Dispense Refill   .  acetaminophen (TYLENOL) 325 MG tablet Take 2 tablets (650 mg total) by mouth every 4 (four) hours as needed for Pain or Fever.     [name] amiodarone (PACERONE) 200 MG tablet Take 1 tablet by mouth for 10 days then stop 10 tablet 0   . amLODIPine (NORVASC) 10 MG tablet Take 1 tablet (10 mg total) by mouth daily.     [name] aspirin EC 325 MG EC tablet Take 1 tablet (325 mg total) by mouth daily. 30 tablet 0   . bumetanide (BUMEX) 1 MG tablet Take one-half tablets (0.5 mg total) by mouth 2 (two) times daily. 30 tablet 0   . carvedilol (COREG) 25 MG tablet Take 25 mg by mouth 2 (two) times daily with meals.        . cloNIDine (CATAPRES) 0.2 MG tablet Take 1 tablet (0.2 mg total) by mouth 2 (two) times daily. 60 tablet 0   . hydrALAZINE (APRESOLINE) 100 MG tablet Take 1 tablet (100 mg total) by mouth 3 (three) times daily. 60 tablet 0   . ondansetron (ZOFRAN) 4 MG tablet      . senna-docusate (PERICOLACE) 8.6-50 MG per tablet Take 2 tablets by mouth 2 (two) times daily. 30 tablet 0   . simvastatin (ZOCOR) 10 MG tablet Take 10 mg by mouth nightly.     [name] SITagliptin (JANUVIA) 25 MG tablet Take 1 tablet (25 mg total) by mouth daily. 30 tablet 3   .  traMADol (ULTRAM) 50 MG tablet Take 1 tablet (50 mg total) by mouth every 8 (eight) hours as needed (pain). 30 tablet 0   . vitamin D, ergocalciferol, (DRISDOL) 50000 UNIT Cap Take 2,000 Units by mouth 2 (two) times daily.       No current facility-administered medications for this visit.        Allergies:  Allergies   Allergen Reactions   . Mosquito (Culex Pipiens) Allergy Skin Test Shortness Of Breath     And  Trouble  breathing   . Lisinopril    . Shellfish-Derived Products Hives     New  allergy  New  allergy   . Penicillins Rash     Swelling,  numbness       Past Medical History:  Past Medical History:   Diagnosis Date   . Cataracts, bilateral    . Cerebrovascular accident    . Chronic kidney disease    . CVA (cerebral infarction) 03/2013   . Diabetes mellitus    . Hypercholesteremia     . Hypertension    . Myocardial infarction        Past Surgical History:  Past Surgical History:   Procedure Laterality Date   . CORONARY ARTERY BYPASS N/A 06/12/2016    Procedure: Coronary Artery Bypass x 4.;  Surgeon: Bjorn Loser, MD;  Location: [facility] HEART OR;  Service: Cardiothoracic;  Laterality: N/A;  LIMA to LDA  Vein to PDA  Vein to OM  Vein to Diagonal   . ENDOSCOPIC,VEIN HARVEST N/A 06/12/2016    Procedure: Endoscopic, Left Leg Greater Saphenous Vein Harvest;  Surgeon: Bjorn Loser, MD;  [facility] HEART [facility];  Service: Cardiothoracic;  Laterality: N/A;  Left Leg Incision (medially, groin to ankle) @ 0742  Vein out of leg @ 0826  Vein prep complete @ 0845  Total time = 63 minutes       Family History:  Family History   Problem Relation Age of Onset   . No known problems Mother    . No known problems Father        Social History:  Social History     Social History   . Marital status: Widowed     Spouse name: N/A   . Number of children: N/A   . Years of education: N/A     Occupational History   . Not on file.     Social History Main Topics   . Smoking status: Never Smoker   . Smokeless tobacco: Never Used   . Alcohol use No   . Drug use: No   . Sexual activity: Not on file     Other Topics Concern   . Not on file     Social History Narrative   . No narrative on file       The following sections were reviewed this encounter by the provider:   Allergies  Meds  Problems           Vitals:  BP 122/77 (BP Site: Left arm, Patient Position: Sitting)   Pulse 64   Temp 97.9 F (36.6 C) (Oral)   Resp 14   Ht 1.524 m (5')   Wt 64 kg (141 lb)   SpO2 94%   BMI 27.54 kg/m     Review of Systems   Constitutional: Positive for chills. Negative for fever.   HENT: Positive for congestion. Negative for sore throat.    Respiratory: Positive for cough and shortness of breath.  Negative for wheezing.    Cardiovascular: Positive for chest pain and palpitations.   Gastrointestinal: Positive for nausea and  vomiting. Negative for abdominal pain and diarrhea.   Genitourinary: Negative for dysuria and hematuria.   Musculoskeletal: Positive for back pain.   Skin: Negative for rash.   Neurological: Positive for headaches.     General Examination:   GENERAL APPEARANCE: alert, in no acute distress, well developed, well nourished, oriented to time, place, and person.   HEAD: normal appearance, atraumatic.   EYES: extraocular movement intact (EOMI), pupils equal, round, reactive to light and accommodation, sclera anicteric, conjunctiva clear.   EARS: tympanic membranes normal bilaterally, external canals normal .   NOSE: normal nasal mucosa, no lesions.   ORAL CAVITY: normal oropharynx, normal lips, mucosa moist, no lesions.   THROAT: normal appearance, clear, no erythema.   NECK/THYROID: neck supple,  no cervical lymphadenopathy, no neck mass palpated, no thyromegaly.   LYMPH NODES: no palpable adenopathy.   SKIN: good turgor,chest wall, surgical site healing well, no d/c or erythema  HEART: S1, S2 normal, no murmurs, rubs, gallops, regular rate and rhythm.   LUNGS: normal effort / no distress, normal breath sounds, clear to auscultation bilaterally, no wheezes, rales, rhonchi.   ABDOMEN: bowel sounds present, no hepatosplenomegaly, soft, nontender, nondistended.   MUSCULOSKELETAL: full range of motion   EXTREMITIES: 2+ edema. No cords. Left leg swollen (vein removed from left leg for CABG). Tender at incision site and along site of vein removed. No erythema or d/c  NEUROLOGIC: nonfocal, cranial nerves 2-12 grossly intact   PSYCH: cognitive function intact, mood/affect full range, speech clear.    1. Post-procedural fever  - CBC and differential  - Comprehensive metabolic panel  - Urinalysis  - Urine culture  - CULTURE BLOOD AEROBIC AND ANAEROBIC  - CULTURE BLOOD AEROBIC AND ANAEROBIC  - X-ray chest PA and lateral; Future    Pt to f/u bland diet  zofran prn  Get CXR tomorrow  F/u in 2 days.  Daughter will get handicapped  paperwork from [facility].  Spent 30 minutes face to face time with patient and daughter, reviewing hospital records and coordinating care  John Giovanni, MD

## 2016-07-04 NOTE — Telephone Encounter (Signed)
Call from Dr. Gustavus Messing (cardiology)  Pt seen today by them, pt c/o fever, chills.  Pt is s/p CABG  They want pt evaluated, including blood cultures.  They will call pt to come in here today at 5:15.  If pt unable to call today, will need to be seen tomorrow.

## 2016-07-05 ENCOUNTER — Ambulatory Visit
Admission: RE | Admit: 2016-07-05 | Discharge: 2016-07-05 | Disposition: A | Discharge status: Home / Self Care | Payer: Medicare Other | Source: Ambulatory Visit | Attending: Internal Medicine | Admitting: Internal Medicine

## 2016-07-05 DIAGNOSIS — R5082 Postprocedural fever: Secondary | ICD-10-CM

## 2016-07-05 LAB — URINALYSIS
Bilirubin, UA: NEGATIVE
Glucose, UA: 100 — AB
Ketones UA: NEGATIVE
Leukocyte Esterase, UA: NEGATIVE
Nitrite, UA: NEGATIVE
Protein, UR: >=1000 — AB
Specific Gravity UA: 1.012 (ref 1.001–1.035)
Urine pH: 6.5 (ref 5.0–8.0)
Urobilinogen, UA: 0.2

## 2016-07-05 LAB — CBC AND DIFFERENTIAL
Absolute NRBC: 0 10*3/uL
Basophils Absolute Automated: 0.02 10*3/uL (ref 0.00–0.20)
Basophils Automated: 0.4 %
Eosinophils Absolute Automated: 0.12 10*3/uL (ref 0.00–0.70)
Eosinophils Automated: 2.6 %
Hematocrit: 26.1 % — ABNORMAL LOW (ref 37.0–47.0)
Hgb: 8.5 g/dL — ABNORMAL LOW (ref 12.0–16.0)
Immature Granulocytes Absolute: 0.01 10*3/uL
Immature Granulocytes: 0.2 %
Lymphocytes Absolute Automated: 0.8 10*3/uL (ref 0.50–4.40)
Lymphocytes Automated: 17.4 %
MCH: 23.5 pg — ABNORMAL LOW (ref 28.0–32.0)
MCHC: 32.6 g/dL (ref 32.0–36.0)
MCV: 72.1 fL — ABNORMAL LOW (ref 80.0–100.0)
MPV: 11.9 fL (ref 9.4–12.3)
Monocytes Absolute Automated: 0.69 10*3/uL (ref 0.00–1.20)
Monocytes: 15 %
Neutrophils Absolute: 2.95 10*3/uL (ref 1.80–8.10)
Neutrophils: 64.4 %
Nucleated RBC: 0 /100 WBC (ref 0.0–1.0)
Platelets: 183 10*3/uL (ref 140–400)
RBC: 3.62 10*6/uL — ABNORMAL LOW (ref 4.20–5.40)
RDW: 18 % — ABNORMAL HIGH (ref 12–15)
WBC: 4.59 10*3/uL (ref 3.50–10.80)

## 2016-07-05 LAB — COMPREHENSIVE METABOLIC PANEL
ALT: 12 U/L (ref 0–55)
AST (SGOT): 17 U/L (ref 5–34)
Albumin/Globulin Ratio: 0.9 (ref 0.9–2.2)
Albumin: 2.4 g/dL — ABNORMAL LOW (ref 3.5–5.0)
Alkaline Phosphatase: 64 U/L (ref 37–106)
BUN: 24 mg/dL — ABNORMAL HIGH (ref 7.0–19.0)
Bilirubin, Total: 0.5 mg/dL (ref 0.1–1.2)
CO2: 25 mEq/L (ref 21–30)
Calcium: 8.1 mg/dL — ABNORMAL LOW (ref 8.5–10.5)
Chloride: 92 mEq/L — ABNORMAL LOW (ref 100–111)
Creatinine: 1.9 mg/dL — ABNORMAL HIGH (ref 0.4–1.5)
Globulin: 2.7 g/dL (ref 2.0–3.7)
Glucose: 102 mg/dL — ABNORMAL HIGH (ref 70–100)
Potassium: 4.9 mEq/L (ref 3.5–5.1)
Protein, Total: 5.1 g/dL — ABNORMAL LOW (ref 6.0–8.3)
Sodium: 123 mEq/L — ABNORMAL LOW (ref 135–146)

## 2016-07-05 LAB — URINE MICROSCOPIC

## 2016-07-05 LAB — GFR: EGFR: 26.6

## 2016-07-05 LAB — HEMOLYSIS INDEX: Hemolysis Index: 3 (ref 0–18)

## 2016-07-06 ENCOUNTER — Emergency Department: Payer: Medicare Other

## 2016-07-06 ENCOUNTER — Inpatient Hospital Stay: Payer: Medicare Other | Admitting: Internal Medicine

## 2016-07-06 ENCOUNTER — Inpatient Hospital Stay
Admission: EM | Admit: 2016-07-06 | Discharge: 2016-07-08 | DRG: 644 | Disposition: A | Discharge status: Home Health | Payer: Medicare Other | Attending: Family Medicine | Admitting: Family Medicine

## 2016-07-06 ENCOUNTER — Encounter (INDEPENDENT_AMBULATORY_CARE_PROVIDER_SITE_OTHER): Payer: Medicare Other | Admitting: Internal Medicine

## 2016-07-06 DIAGNOSIS — Z794 Long term (current) use of insulin: Secondary | ICD-10-CM

## 2016-07-06 DIAGNOSIS — E222 Syndrome of inappropriate secretion of antidiuretic hormone: Principal | ICD-10-CM | POA: Diagnosis present

## 2016-07-06 DIAGNOSIS — I129 Hypertensive chronic kidney disease with stage 1 through stage 4 chronic kidney disease, or unspecified chronic kidney disease: Secondary | ICD-10-CM | POA: Diagnosis present

## 2016-07-06 DIAGNOSIS — I69344 Monoplegia of lower limb following cerebral infarction affecting left non-dominant side: Secondary | ICD-10-CM

## 2016-07-06 DIAGNOSIS — E8770 Fluid overload, unspecified: Secondary | ICD-10-CM | POA: Diagnosis present

## 2016-07-06 DIAGNOSIS — N184 Chronic kidney disease, stage 4 (severe): Secondary | ICD-10-CM | POA: Diagnosis present

## 2016-07-06 DIAGNOSIS — I2510 Atherosclerotic heart disease of native coronary artery without angina pectoris: Secondary | ICD-10-CM | POA: Diagnosis present

## 2016-07-06 DIAGNOSIS — E1122 Type 2 diabetes mellitus with diabetic chronic kidney disease: Secondary | ICD-10-CM | POA: Diagnosis present

## 2016-07-06 DIAGNOSIS — I10 Essential (primary) hypertension: Secondary | ICD-10-CM

## 2016-07-06 DIAGNOSIS — J9811 Atelectasis: Secondary | ICD-10-CM | POA: Diagnosis present

## 2016-07-06 DIAGNOSIS — D649 Anemia, unspecified: Secondary | ICD-10-CM

## 2016-07-06 DIAGNOSIS — E119 Type 2 diabetes mellitus without complications: Secondary | ICD-10-CM | POA: Diagnosis present

## 2016-07-06 DIAGNOSIS — Z8673 Personal history of transient ischemic attack (TIA), and cerebral infarction without residual deficits: Secondary | ICD-10-CM

## 2016-07-06 DIAGNOSIS — D638 Anemia in other chronic diseases classified elsewhere: Secondary | ICD-10-CM | POA: Diagnosis present

## 2016-07-06 DIAGNOSIS — N289 Disorder of kidney and ureter, unspecified: Secondary | ICD-10-CM

## 2016-07-06 DIAGNOSIS — N189 Chronic kidney disease, unspecified: Secondary | ICD-10-CM

## 2016-07-06 DIAGNOSIS — E7800 Pure hypercholesterolemia, unspecified: Secondary | ICD-10-CM | POA: Diagnosis present

## 2016-07-06 DIAGNOSIS — R6883 Chills (without fever): Secondary | ICD-10-CM

## 2016-07-06 DIAGNOSIS — Z951 Presence of aortocoronary bypass graft: Secondary | ICD-10-CM

## 2016-07-06 DIAGNOSIS — I252 Old myocardial infarction: Secondary | ICD-10-CM

## 2016-07-06 DIAGNOSIS — E871 Hypo-osmolality and hyponatremia: Secondary | ICD-10-CM | POA: Diagnosis present

## 2016-07-06 HISTORY — DX: Old myocardial infarction: I25.2

## 2016-07-06 LAB — CBC AND DIFFERENTIAL
Absolute NRBC: 0 10*3/uL
Basophils Absolute Automated: 0.01 10*3/uL (ref 0.00–0.20)
Basophils Automated: 0.2 %
Eosinophils Absolute Automated: 0.2 10*3/uL (ref 0.00–0.70)
Eosinophils Automated: 4 %
Hematocrit: 23.2 % — ABNORMAL LOW (ref 37.0–47.0)
Hgb: 7.6 g/dL — ABNORMAL LOW (ref 12.0–16.0)
Immature Granulocytes Absolute: 0.02 10*3/uL
Immature Granulocytes: 0.4 %
Lymphocytes Absolute Automated: 0.79 10*3/uL (ref 0.50–4.40)
Lymphocytes Automated: 15.7 %
MCH: 23.3 pg — ABNORMAL LOW (ref 28.0–32.0)
MCHC: 32.8 g/dL (ref 32.0–36.0)
MCV: 71.2 fL — ABNORMAL LOW (ref 80.0–100.0)
MPV: 9.7 fL (ref 9.4–12.3)
Monocytes Absolute Automated: 0.59 10*3/uL (ref 0.00–1.20)
Monocytes: 11.7 %
Neutrophils Absolute: 3.43 10*3/uL (ref 1.80–8.10)
Neutrophils: 68 %
Nucleated RBC: 0 /100 WBC (ref 0.0–1.0)
Platelets: 193 10*3/uL (ref 140–400)
RBC: 3.26 10*6/uL — ABNORMAL LOW (ref 4.20–5.40)
RDW: 19 % — ABNORMAL HIGH (ref 12–15)
WBC: 5.04 10*3/uL (ref 3.50–10.80)

## 2016-07-06 LAB — BASIC METABOLIC PANEL
Anion Gap: 5 (ref 5.0–15.0)
Anion Gap: 7 (ref 5.0–15.0)
Anion Gap: 5 (ref 5.0–15.0)
BUN: 26.9 mg/dL — ABNORMAL HIGH (ref 7.0–19.0)
BUN: 25.3 mg/dL — ABNORMAL HIGH (ref 7.0–19.0)
BUN: 25.2 mg/dL — ABNORMAL HIGH (ref 7.0–19.0)
CO2: 23 mEq/L (ref 22–29)
CO2: 22 mEq/L (ref 22–29)
CO2: 22 mEq/L (ref 22–29)
Calcium: 7.9 mg/dL — ABNORMAL LOW (ref 8.5–10.5)
Calcium: 8 mg/dL — ABNORMAL LOW (ref 8.5–10.5)
Calcium: 7.9 mg/dL — ABNORMAL LOW (ref 8.5–10.5)
Chloride: 96 mEq/L — ABNORMAL LOW (ref 100–111)
Chloride: 98 mEq/L — ABNORMAL LOW (ref 100–111)
Chloride: 99 mEq/L — ABNORMAL LOW (ref 100–111)
Creatinine: 2.2 mg/dL — ABNORMAL HIGH (ref 0.6–1.0)
Creatinine: 2.1 mg/dL — ABNORMAL HIGH (ref 0.6–1.0)
Creatinine: 2 mg/dL — ABNORMAL HIGH (ref 0.6–1.0)
Glucose: 150 mg/dL — ABNORMAL HIGH (ref 70–100)
Glucose: 125 mg/dL — ABNORMAL HIGH (ref 70–100)
Glucose: 126 mg/dL — ABNORMAL HIGH (ref 70–100)
Potassium: 4.8 mEq/L (ref 3.5–5.1)
Potassium: 4.9 mEq/L (ref 3.5–5.1)
Potassium: 4.8 mEq/L (ref 3.5–5.1)
Sodium: 124 mEq/L — ABNORMAL LOW (ref 136–145)
Sodium: 127 mEq/L — ABNORMAL LOW (ref 136–145)
Sodium: 126 mEq/L — ABNORMAL LOW (ref 136–145)

## 2016-07-06 LAB — URINALYSIS, REFLEX TO MICROSCOPIC EXAM IF INDICATED
Bilirubin, UA: NEGATIVE
Blood, UA: NEGATIVE
Glucose, UA: 50 — AB
Ketones UA: NEGATIVE
Leukocyte Esterase, UA: NEGATIVE
Nitrite, UA: NEGATIVE
Protein, UR: >=500 — AB
Specific Gravity UA: 1.01 (ref 1.001–1.035)
Urine pH: 6 (ref 5.0–8.0)
Urobilinogen, UA: NORMAL mg/dL

## 2016-07-06 LAB — GLUCOSE WHOLE BLOOD - POCT
Whole Blood Glucose POCT: 132 mg/dL — ABNORMAL HIGH (ref 70–100)
Whole Blood Glucose POCT: 127 mg/dL — ABNORMAL HIGH (ref 70–100)

## 2016-07-06 LAB — GFR
EGFR: 22.5
EGFR: 23.7
EGFR: 25.1

## 2016-07-06 LAB — PHOSPHORUS: Phosphorus: 3.5 mg/dL (ref 2.3–4.7)

## 2016-07-06 LAB — TYPE AND SCREEN
AB Screen Gel: NEGATIVE
ABO Rh: O POS

## 2016-07-06 LAB — HEMOLYSIS INDEX: Hemolysis Index: 18 (ref 0–18)

## 2016-07-06 LAB — OSMOLALITY: Osmolality: 267 mosm/kg — ABNORMAL LOW (ref 280–300)

## 2016-07-06 LAB — FERRITIN: Ferritin: 504.89 ng/mL — ABNORMAL HIGH (ref 4.60–204.00)

## 2016-07-06 LAB — MAGNESIUM: Magnesium: 1.8 mg/dL (ref 1.6–2.6)

## 2016-07-06 MED ORDER — POLYETHYLENE GLYCOL 3350 17 G PO PACK
17.0000 g | PACK | Freq: Every day | ORAL | Status: DC
Start: 2016-07-06 — End: 2016-07-08
  Administered 2016-07-06 – 2016-07-08 (×3): 17 g via ORAL
  Filled 2016-07-06 (×3): qty 1

## 2016-07-06 MED ORDER — ONDANSETRON HCL 4 MG/2ML IJ SOLN
4.0000 mg | Freq: Four times a day (QID) | INTRAMUSCULAR | Status: DC | PRN
Start: 2016-07-06 — End: 2016-07-08

## 2016-07-06 MED ORDER — SODIUM CHLORIDE 0.9 % IV SOLN
INTRAVENOUS | Status: DC
Start: 2016-07-06 — End: 2016-07-07

## 2016-07-06 MED ORDER — SENNOSIDES-DOCUSATE SODIUM 8.6-50 MG PO TABS
2.0000 | ORAL_TABLET | Freq: Two times a day (BID) | ORAL | Status: DC
Start: 2016-07-06 — End: 2016-07-08
  Administered 2016-07-06 – 2016-07-08 (×4): 2 via ORAL
  Filled 2016-07-06 (×4): qty 2

## 2016-07-06 MED ORDER — ONDANSETRON 4 MG PO TBDP
4.0000 mg | ORAL_TABLET | Freq: Four times a day (QID) | ORAL | Status: DC | PRN
Start: 2016-07-06 — End: 2016-07-08

## 2016-07-06 MED ORDER — TRAMADOL HCL 50 MG PO TABS
50.0000 mg | ORAL_TABLET | Freq: Three times a day (TID) | ORAL | Status: DC | PRN
Start: 2016-07-06 — End: 2016-07-08
  Administered 2016-07-06: 50 mg via ORAL
  Filled 2016-07-06: qty 1

## 2016-07-06 MED ORDER — ACETAMINOPHEN 325 MG PO TABS
650.0000 mg | ORAL_TABLET | ORAL | Status: DC | PRN
Start: 2016-07-06 — End: 2016-07-08

## 2016-07-06 MED ORDER — HYDRALAZINE HCL 50 MG PO TABS
100.0000 mg | ORAL_TABLET | Freq: Three times a day (TID) | ORAL | Status: DC
Start: 2016-07-06 — End: 2016-07-08
  Administered 2016-07-06 – 2016-07-08 (×6): 100 mg via ORAL
  Filled 2016-07-06 (×6): qty 2

## 2016-07-06 MED ORDER — ASPIRIN 325 MG PO TBEC
325.0000 mg | DELAYED_RELEASE_TABLET | Freq: Every day | ORAL | Status: DC
Start: 2016-07-06 — End: 2016-07-08
  Administered 2016-07-07 – 2016-07-08 (×2): 325 mg via ORAL
  Filled 2016-07-06 (×3): qty 1

## 2016-07-06 MED ORDER — SODIUM CHLORIDE 0.9 % IV SOLN
500.0000 mg | Freq: Once | INTRAVENOUS | Status: AC
Start: 2016-07-06 — End: 2016-07-06
  Administered 2016-07-06: 500 mg via INTRAVENOUS
  Filled 2016-07-06: qty 500

## 2016-07-06 MED ORDER — INSULIN LISPRO 100 UNIT/ML SC SOLN
1.0000 [IU] | Freq: Three times a day (TID) | SUBCUTANEOUS | Status: DC | PRN
Start: 2016-07-06 — End: 2016-07-08

## 2016-07-06 MED ORDER — SODIUM CHLORIDE 0.9 % IV BOLUS
1000.0000 mL | Freq: Once | INTRAVENOUS | Status: AC
Start: 2016-07-06 — End: 2016-07-06
  Administered 2016-07-06: 1000 mL via INTRAVENOUS

## 2016-07-06 MED ORDER — ALUM & MAG HYDROXIDE-SIMETH 200-200-20 MG/5ML PO SUSP
15.0000 mL | ORAL | Status: DC | PRN
Start: 2016-07-06 — End: 2016-07-08

## 2016-07-06 MED ORDER — GLUCOSE 40 % PO GEL
15.0000 g | ORAL | Status: DC | PRN
Start: 2016-07-06 — End: 2016-07-08

## 2016-07-06 MED ORDER — AMLODIPINE BESYLATE 5 MG PO TABS
10.0000 mg | ORAL_TABLET | Freq: Every day | ORAL | Status: DC
Start: 2016-07-06 — End: 2016-07-08
  Administered 2016-07-06 – 2016-07-08 (×3): 10 mg via ORAL
  Filled 2016-07-06 (×3): qty 2

## 2016-07-06 MED ORDER — GLUCAGON 1 MG IJ SOLR (WRAP)
1.0000 mg | INTRAMUSCULAR | Status: DC | PRN
Start: 2016-07-06 — End: 2016-07-08

## 2016-07-06 MED ORDER — INSULIN LISPRO 100 UNIT/ML SC SOLN
1.0000 [IU] | Freq: Every evening | SUBCUTANEOUS | Status: DC | PRN
Start: 2016-07-06 — End: 2016-07-08

## 2016-07-06 MED ORDER — CARVEDILOL 12.5 MG PO TABS
25.0000 mg | ORAL_TABLET | Freq: Two times a day (BID) | ORAL | Status: DC
Start: 2016-07-06 — End: 2016-07-08
  Administered 2016-07-06 – 2016-07-08 (×4): 25 mg via ORAL
  Filled 2016-07-06 (×4): qty 2

## 2016-07-06 MED ORDER — SIMVASTATIN 10 MG PO TABS
10.0000 mg | ORAL_TABLET | Freq: Every evening | ORAL | Status: DC
Start: 2016-07-06 — End: 2016-07-08
  Administered 2016-07-06 – 2016-07-07 (×2): 10 mg via ORAL
  Filled 2016-07-06 (×2): qty 1

## 2016-07-06 MED ORDER — NALOXONE HCL 0.4 MG/ML IJ SOLN (WRAP)
0.2000 mg | INTRAMUSCULAR | Status: DC | PRN
Start: 2016-07-06 — End: 2016-07-08

## 2016-07-06 MED ORDER — CLONIDINE HCL 0.2 MG PO TABS
0.2000 mg | ORAL_TABLET | Freq: Two times a day (BID) | ORAL | Status: DC
Start: 2016-07-06 — End: 2016-07-08
  Administered 2016-07-06 – 2016-07-08 (×4): 0.2 mg via ORAL
  Filled 2016-07-06 (×4): qty 1

## 2016-07-06 MED ORDER — DEXTROSE 10 % IV BOLUS
125.0000 mL | INTRAVENOUS | Status: DC | PRN
Start: 2016-07-06 — End: 2016-07-08

## 2016-07-06 NOTE — H&P (Signed)
[facility] HOSPITALIST  H&P    Patient Info:   Date/Time: 07/06/2016 / 1:10 PM   Admit Date:07/06/2016  Patient Name:Jacqueline Weber   WOE:[id number]   PCP: John Giovanni, MD  Attending Physician: Karle Barr      Assessment and Plan:     1. Intermittent dizziness secondary to Hyponatremia on acute on Chronic kidney disease, stage 4 Crea is 2.2.  Sodium is 124 today but was 132 on 06/21/16. On average , creatinine is anywhere from 1.8-2.6.  Prior to transfer from [place], Renal said to take a break from her diuretic medication, which improved her creatinine. This above is most likely again secondary to Bumex as well as decrease in PO intake    Renal ultrasound on May 16, 2016 showed no hydronephrosis. Asymmetric kidney size, smaller on the right side. Renal artery ultrasound duplex on May 18, 2016. Last admission showed no evidence of significant renal artery stenosis. Limited visualization of the mid left renal artery. Elevated resistive indices bilaterally. This is often indicated for medical renal disease. Left heart cath today did not show proximal renal artery stenosis.   Gentle IV fluids   Will add on Osmolite, random sodium   Hold Bumex for now.   Consult Dr. Mina Marble nephrology   Monitor neurological status    2. CAD and recent non-STEMI, status post transfer to [facility] for 4 vessel bypass for which she was discharged a few weeks ago. No complaints of chest pain.  Incision is healing well   Patient is on Coreg and aspirin, Zocor    3. Intermittent nonproductive cough with questionable chills with x-ray showing small left pleural effusion with left lung base consolation suspicious for pneumonia. Patient has no fever, shortness of breath and her cough was more of a side effect from the recent coronary artery bypass graft and atelectasis   Will add on Procalcitonin, but has been covered with Zithromax in the ER.   Continue monitor complete blood count and for spiking  temps   Encouraged IS    4. Hypertension: trends, stable on admission.  Continue home Norvasc, Coreg, Catapres, hydralazine    5. History of glaucoma. Patient is due to follow-up with primary ophthalmologist Dr. Lance Sell. No pain or vision changes, new from a baseline    6. Left kidney cysts 1x 0.9x0.9 cm. This finding discussed with daughter Ms. [name] at bedside last admission and recommend to follow up with Primary care physician to monitor for cancer. Follow-up with Nephrology.    7. History of CVA in 2014 without residual deficits.  Continue full dose aspirin and statin    8. History of non-insulin-dependent diabetes.  Blood sugar 150 on admission.  Patient is on [employer] use low sliding scale insulin as needed.   ADA diet    9. Microcytic anemia, most likely secondary to chronic disease and recent coronary artery bypass graft.  H and H is lower at 7.6 and 23.3, but no signs of active bleeding. UA shows small blood.  Trends showed she really runs 9-10/20-32.    Add on iron profile, ferritin   Check occult blood   Type and screen, but hold off on any blood transfusions at this point    10. History of high cholesterol, LFTs normal, continue home Zocor    11. Carotid Doppler done on June 06, 2016 show mild to moderate bilateral carotid bulb calcified plaque without hemodynamically significant stenosis. On ASA    12.  Intermittent  complaints of constipation.  I will continue home Peri-Colace, but also add MiraLAX to med regimen for now    DVT Prohylaxis: SCD's   Code Status: Full  Disposition: home   Type of Admission:Inpatient  Estimated Length of Stay (including stay in the ER receiving treatment): greater than 2 midnights  Medical Necessity for stay: Penetrating me and anemia management  Clinical Presentation:   History of Presenting Illness:   Jacqueline Weber is a 65 y.o. female who has history of   Past Surgical History:   Procedure Laterality Date   . CORONARY  ARTERY BYPASS N/A 06/12/2016    Procedure: Coronary Artery Bypass x 4.;  Surgeon: Bjorn Loser, MD;  Location: [facility] HEART OR;  Service: Cardiothoracic;  Laterality: N/A;  LIMA to LDA  Vein to PDA  Vein to OM  Vein to Diagonal   . ENDOSCOPIC,VEIN HARVEST N/A 06/12/2016    Procedure: Endoscopic, Left Leg Greater Saphenous Vein Harvest;  Surgeon: Bjorn Loser, MD;  Location: [facility] HEART OR;  Service: Cardiothoracic;  Laterality: N/A;  Left Leg Incision (medially, groin to ankle) @ 0742  Vein out of leg @ 0826  Vein prep complete @ 0845  Total time = 63 minutes      Past Medical History:   Diagnosis Date   . Cataracts, bilateral    . Chronic kidney disease    . Diabetes mellitus    . History of CVA (cerebrovascular accident) 03/2013   . History of MI (myocardial infarction)    . 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 Hypercholesteremia    . Hypertension      This 65 year old female with a past history seen above, comes to the ER, sent by her cardiologist for hyponatremia.  The patient recently was sent to [facility] for a coronary artery bypass graft in the beginning of October. H and P obtained from patient at the bedside per patient request as her main language is [place]. Since coming home from [place], she has had some intermittent dizziness mostly on exertion.  Daughter also states she has had an intermittent nonproductive cough that is random at times as been feeling "colder" than she normally does for the last few days.  No overt fevers, shortness of breath or any other complaints. She has not been drinking her normal amount of water, but is still taking her Bumex.  She is still having trouble with intermittent constipation.  Patient is sleeping comfortably in the stretcher.  He denies any chest pain, shortness breath, palpitations, abdominal pain, nausea, vomiting, diarrhea, melena, hematuria, focal weakness, numbness, syncope, fever, leg edema or any other complaints.       Review of Systems:   A comprehensive  review of systems was negative except for the underlying highlighted in Bold     Constitutional: chills, weight loss, malaise/fatigue and diaphoresis.   HENT: hearing loss, ear pain, nosebleeds, congestion, sore throat, neck pain, tinnitus and ear discharge.   Eyes: blurred vision, double vision, photophobia, pain, discharge and redness.   Respiratory: cough, hemoptysis, sputum production, shortness of breath, wheezing and stridor.   Cardiovascular: chest pain, palpitations, orthopnea, claudication, leg swelling and PND.   Gastrointestinal: heartburn, nausea, vomiting, abdominal pain, diarrhea, constipation, blood in stool and melena.   Genitourinary: dysuria, urgency, frequency, hematuria and flank pain.   Musculoskeletal: myalgias, back pain, joint pain and falls.   Skin: itching and rash.   Neurological: dizziness, tingling, tremors, sensory change, speech change, focal weakness,  seizures, loss of consciousness, weakness and headaches.   Endo/Heme/Allergies: environmental allergies and polydipsia. Does not bruise/bleed easily.   Psychiatric/Behavioral: depression, suicidal ideas, hallucinations, memory loss and substance abuse. The patient is not nervous/anxious and does not have insomnia.     Physical Exam:     Vitals:    07/06/16 1119 07/06/16 1146 07/06/16 1222 07/06/16 1223   BP: 136/62 118/57 143/63    Pulse: 81 (!) 56 60 (!) 58   Resp: 18      Temp:       TempSrc:       SpO2: 97% 96% 98% 96%   Weight:       Height:           Constitutional: Patient is oriented to person, place, and time. Patient appears well-developed and well-nourished.   Head: Normocephalic and atraumatic.  Eyes- pupils equal and reactive, extraocular eye movements intact, sclera anicteric  Nose - normal and patent, no erythema, discharge or polyps and normal nontender sinuses  Mouth - mucous membranes dry, pharynx normal without lesions  Neck: Normal range of motion. Neck supple. No JVD present. No bruit or thrill noted on bilateral  carotids.   Cardiovascular: Normal rate, regular rhythm, normal heart sounds and intact distal pulses. Exam reveals no gallop and no friction rub. No murmur heard.  Pulmonary/Chest: Effort normal and breath sounds normal, diminished in the left lower lobe. No stridor. No respiratory distress. Patient has no wheezes. No crackles were present. Exhibits no tenderness on palpation.  Coronary artery bypass graft incision healing well and scabbed  Abdominal: Soft. Bowel sounds are normal. Patient exhibits no distension and no mass was palpable. There is no tenderness. There is no rebound and no guarding.   Musculoskeletal: Normal range of motion. Patient exhibits no edema and no tenderness.   Lymphadenopathy: Patient has no cervical adenopathy.   Neurological: Patient is alert and oriented to person, place, and time and has normal reflexes. No cranial nerve deficit. Normal muscle tone.   Skin: Skin is warm. No rash noted. Patient is not diaphoretic. No erythema. Pallor.   Psychiatric: Has normal mood and affect. Behavior is normal. Judgment and thought content normal    Physical Exam  Clinical Information and History:   Chief Complaint:  Chief Complaint   Patient presents with   . Abnormal Lab     Past Medical History:  Past Medical History:   Diagnosis Date   . Cataracts, bilateral    . Chronic kidney disease    . Diabetes mellitus    . History of CVA (cerebrovascular accident) 03/2013   . History of MI (myocardial infarction)    . 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 Hypercholesteremia    . Hypertension      Past Surgical History:  Past Surgical History:   Procedure Laterality Date   . CORONARY ARTERY BYPASS N/A 06/12/2016    Procedure: Coronary Artery Bypass x 4.;  Surgeon: Bjorn Loser, MD;  Location: [facility] HEART OR;  Service: Cardiothoracic;  Laterality: N/A;  LIMA to LDA  Vein to PDA  Vein to OM  Vein to Diagonal   . ENDOSCOPIC,VEIN HARVEST N/A 06/12/2016    Procedure: Endoscopic, Left Leg Greater Saphenous Vein  Harvest;  Surgeon: Bjorn Loser, MD;  Location: [facility] HEART OR;  Service: Cardiothoracic;  Laterality: N/A;  Left Leg Incision (medially, groin to ankle) @ 0742  Vein out of leg @ 0826  Vein prep complete @  0845  Total time = 63 minutes     Family History:  Family History   Problem Relation Age of Onset   . No known problems Mother    . No known problems Father      Social History:  History   Alcohol Use No     History   Drug Use No     History   Smoking Status   . Never Smoker   Smokeless Tobacco   . Never Used     Social History     Social History   . Marital status: Widowed     Spouse name: N/A   . Number of children: N/A   . Years of education: N/A     Social History Main Topics   . Smoking status: Never Smoker   . Smokeless tobacco: Never Used   . Alcohol use No   . Drug use: No   . Sexual activity: Not Asked     Other Topics Concern   . None     Social History Narrative   . None     Allergies:  Allergies   Allergen Reactions   . Mosquito (Culex Pipiens) Allergy Skin Test Shortness Of Breath     And  Trouble  breathing   . Lisinopril    . Shellfish-Derived Products Hives     New  allergy  New  allergy   . Penicillins Rash     Swelling,  numbness     Medications: See med rec    Results of Labs/imaging   Labs have been reviewed:   Coagulation Profile:       CBC review:   Recent Labs  Lab 07/06/16  1115 07/04/16  1724   WBC 5.04 4.59   Hgb 7.6* 8.5*   Hematocrit 23.2* 26.1*   Platelets 193 183   MCV 71.2* 72.1*   RDW 19* 18*   Neutrophils 68.0 64.4   Lymphocytes Automated 15.7 17.4   Eosinophils Automated 4.0 2.6   Immature Granulocyte 0.4 0.2   Neutrophils Absolute 3.43 2.95   Absolute Immature Granulocyte 0.02 0.01     Chem Review:  Recent Labs  Lab 07/06/16  1115 07/04/16  1724   Sodium 124* 123*   Potassium 4.8 4.9   Chloride 96* 92*   CO2 23 25   BUN 26.9* 24.0*   Creatinine 2.2* 1.9*   Glucose 150* 102*   Calcium 7.9* 8.1*   Magnesium 1.8  --    Phosphorus 3.5  --    Bilirubin, Total  --  0.5   AST (SGOT)   --  17   ALT  --  12   Alkaline Phosphatase  --  64     Results     Procedure Component Value Units Date/Time    Procalcitonin [[id number]] Collected:  07/06/16 1115     Updated:  07/06/16 0034    Basic Metabolic Panel [[id number]]  (Abnormal) Collected:  07/06/16 1115    Specimen:  Blood Updated:  07/06/16 1150     Glucose 150 (H) mg/dL      BUN 26.9 (H) mg/dL      Creatinine 2.2 (H) mg/dL      Calcium 7.9 (L) mg/dL      Sodium 124 (L) mEq/L      Potassium 4.8 mEq/L      Chloride 96 (L) mEq/L      CO2 23 mEq/L      Anion Gap 5.0  GFR [[id number]] Collected:  07/06/16 1115     Updated:  07/06/16 1150     EGFR 22.5    Magnesium [[id number]] Collected:  07/06/16 1115    Specimen:  Blood Updated:  07/06/16 1150     Magnesium 1.8 mg/dL     Phosphorus [[id number]] Collected:  07/06/16 1115    Specimen:  Blood Updated:  07/06/16 1150     Phosphorus 3.5 mg/dL     CBC with differential [[id number]]  (Abnormal) Collected:  07/06/16 1115    Specimen:  Blood from Blood Updated:  07/06/16 1130     WBC 5.04 x10 3/uL      Hgb 7.6 (L) g/dL      Hematocrit 23.2 (L) %      Platelets 193 x10 3/uL      RBC 3.26 (L) x10 6/uL      MCV 71.2 (L) fL      MCH 23.3 (L) pg      MCHC 32.8 g/dL      RDW 19 (H) %      MPV 9.7 fL      Neutrophils 68.0 %      Lymphocytes Automated 15.7 %      Monocytes 11.7 %      Eosinophils Automated 4.0 %      Basophils Automated 0.2 %      Immature Granulocyte 0.4 %      Nucleated RBC 0.0 /100 WBC      Neutrophils Absolute 3.43 x10 3/uL      Abs Lymph Automated 0.79 x10 3/uL      Abs Mono Automated 0.59 x10 3/uL      Abs Eos Automated 0.20 x10 3/uL      Absolute Baso Automated 0.01 x10 3/uL      Absolute Immature Granulocyte 0.02 x10 3/uL      Absolute NRBC 0.00 x10 3/uL         Radiology reports have been reviewed:  Radiology Results (24 Hour)     Procedure Component Value Units Date/Time    XR Chest 2 Views [[id number]] Collected:  07/06/16 1237    Order Status:  Completed Updated:  07/06/16 1245    Narrative:        Chest x-ray    HISTORY: Status post CABG. Chills.    COMPARISON: 07/05/2016, 06/20/2016    Frontal and lateral views. Small left pleural effusion. Increased  airspace consolidation of the left lung base, suspicious for pneumonia.  No pneumothorax seen. Enlarged cardiac mediastinal contour, may be  cardiomegaly or pericardial effusion. Postoperative changes of the  mediastinum.      Impression:        Small left pleural effusion. Left lung base consolidation is  suspicious for pneumonia. Clinical correlation and follow-up until  resolution.    Jonnie Kind, MD   07/06/2016 12:41 PM          EKG:   EKG Interpretation    Interpreted by me    Rhythm: normal sinus   Rate: normal  Axis: normal  Ectopy: none  Conduction: normal  ST Segments: no acute change  T Waves: no acute change  Q Waves: none    Clinical Impression: no acute changes and normal EKG  Hospitalist   Signed by:   Renato Gails Renkes  07/06/2016 1:10 PM    *This note was generated by the Epic EMR system/ Dragon speech recognition and may contain inherent errors or omissions not intended by the user. Grammatical errors,  random word insertions, deletions, pronoun errors and incomplete sentences are occasional consequences of this technology due to software limitations. Not all errors are caught or corrected. If there are questions or concerns about the content of this note or information contained within the body of this dictation they should be addressed directly with the author for clarification

## 2016-07-06 NOTE — ED Triage Notes (Signed)
Pt was sent here by PMD. Pt had open heart surgery approx 2-3 weeks ago, wasn't feeling well during this past week, dizzy and confused and was told to go to PMD for blood work. Pt had blood drawn two days ago and was called this morning and told her sodium was low.

## 2016-07-06 NOTE — Plan of Care (Signed)
Problem: Fluid and Electrolyte Imbalance/ Endocrine  Goal: Fluid and electrolyte balance are achieved/maintained  Outcome: Progressing   07/06/16 1730   Goal/Interventions addressed this shift   Fluid and electrolyte balance are achieved/maintained Monitor intake and output every shift;Monitor/assess lab values and report abnormal values;Provide adequate hydration;Assess for confusion/personality changes;Assess and reassess fluid and electrolyte status;Observe for cardiac arrhythmias;Monitor for muscle weakness       Problem: Nutrition  Goal: Nutritional intake is adequate  Outcome: Progressing   07/06/16 1730   Goal/Interventions addressed this shift   Nutritional intake is adequate Monitor daily weights;Assist patient with meals/food selection;Allow adequate time for meals;Encourage/perform oral hygiene as appropriate       Problem: Diabetes: Glucose Imbalance  Goal: Blood glucose stable at established goal  Outcome: Progressing   07/06/16 1730   Goal/Interventions addressed this shift   Blood glucose stable at established goal  Monitor lab values;Monitor intake and output. Notify LIP if urine output is < 30 mL/hour.;Follow fluid restrictions/IV/PO parameters;Include patient/family in decisions related to nutrition/dietary selections;Assess for hypoglycemia /hyperglycemia;Monitor/assess vital signs;Coordinate medication administration with meals, as indicated;Ensure appropriate diet and assess tolerance;Ensure adequate hydration;Ensure appropriate consults are obtained (Nutrition, Diabetes Education, and Case Management);Ensure patient/family has adequate teaching materials       Problem: Safety  Goal: Patient will be free from injury during hospitalization  Outcome: Progressing   07/06/16 1730   Goal/Interventions addressed this shift   Patient will be free from injury during hospitalization  Assess patient's risk for falls and implement fall prevention plan of care per policy;Provide and maintain safe  environment;Use appropriate transfer methods;Ensure appropriate safety devices are available at the bedside;Include patient/ family/ care giver in decisions related to safety;Hourly rounding       Problem: Pain  Goal: Pain at adequate level as identified by patient  Outcome: Progressing   07/06/16 1730   Goal/Interventions addressed this shift   Pain at adequate level as identified by patient Identify patient comfort function goal;Assess for risk of opioid induced respiratory depression, including snoring/sleep apnea. Alert healthcare team of risk factors identified.;Assess pain on admission, during daily assessment and/or before any "as needed" intervention(s);Reassess pain within 30-60 minutes of any procedure/intervention, per Pain Assessment, Intervention, Reassessment (AIR) Cycle;Evaluate if patient comfort function goal is met;Evaluate patient's satisfaction with pain management progress;Offer non-pharmacological pain management interventions

## 2016-07-06 NOTE — ED Notes (Signed)
Report given to RN Mary

## 2016-07-06 NOTE — Consults (Signed)
[facility]  Consultation    Date Time: 07/06/16 5:34 PM  Patient Name: Jacqueline Weber, Jacqueline Weber  Medical Record Number: [id number]   Primary Care Physician: @LABRRPCP @   Requesting Physician: Clare Gandy, MD    Reason for Consultation:    Hyponatremia  Assessment:    Hyponatremia - Severe. Poor solute intake + diuretics + nausea vs SIAD.   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 Thank you for allowing [place] to participate in the care of this patient.    Francis Dowse, MD  Office [phone] or TigerText    History:   [name] is a 65 y.o. female pmhx CKD Stage 4, CAD s/p CABG who presents to the hospital on 07/06/2016 with an abnormal sodium level of 123 mmol/l. She denies chest pain, SOB, changes in vision, headaches, paresthesias. She has not been having vomiting or diarrhea at home. However, her daughter notes that she has not been eating well (food does not have enough taste) and is taking her prescribed Bumex. In addition she has been dealing with nausea since her CABG. Her sodium while at [facility] was around 132 mmol/l.    Past Medical and Surgical  History:     Past Medical History:   Diagnosis Date   . Cataracts, bilateral    . Chronic kidney disease    . Diabetes mellitus    . History of CVA (cerebrovascular accident) 03/2013   . History of MI (myocardial infarction)    . 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 Hypercholesteremia    . Hypertension      Past Surgical History:   Procedure Laterality Date   . CORONARY ARTERY BYPASS N/A 06/12/2016    Procedure: Coronary Artery Bypass x 4.;  Surgeon: Bjorn Loser, MD;  Location: [facility] HEART OR;  Service:  Cardiothoracic;  Laterality: N/A;  LIMA to LDA  Vein to PDA  Vein to OM  Vein to Diagonal   . ENDOSCOPIC,VEIN HARVEST N/A 06/12/2016    Procedure: Endoscopic, Left Leg Greater Saphenous Vein Harvest;  Surgeon: Bjorn Loser, MD;  Location: [facility] HEART OR;  Service: Cardiothoracic;  Laterality: N/A;  Left Leg Incision (medially, groin to ankle) @ 0742  Vein out of leg @ 0826  Vein prep complete @ 0845  Total time = 63 minutes     Family History:     Family History   Problem Relation Age of Onset   . No known problems Mother    . No known problems Father      Social History:     Social History     Social History   . Marital status: Widowed     Spouse name: N/A   . Number of children: N/A   . Years of education: N/A     Occupational History   . Not on file.     Social History Main Topics   . Smoking status: Never Smoker   . Smokeless tobacco:  Never Used   . Alcohol use No   . Drug use: No   . Sexual activity: Not on file     Other Topics Concern   . Not on file     Social History Narrative   . No narrative on file     Allergies:     Allergies   Allergen Reactions   . Mosquito (Culex Pipiens) Allergy Skin Test Shortness Of Breath     And  Trouble  breathing   . Lisinopril    . Shellfish-Derived Products Hives     New  allergy  New  allergy   . Penicillins Rash     Swelling,  numbness     Medications:     Current Facility-Administered Medications   Medication Dose Route Frequency   . amLODIPine  10 mg Oral Daily   . aspirin EC  325 mg Oral Daily   . carvedilol  25 mg Oral BID Meals   . cloNIDine  0.2 mg Oral BID   . hydrALAZINE  100 mg Oral TID   . polyethylene glycol  17 g Oral Daily   . senna-docusate  2 tablet Oral BID   . simvastatin  10 mg Oral QHS     Review of Systems:   10 systems were reviewed and were negative  Physical Exam:   Temp:  [96 F (35.6 C)-98.2 F (36.8 C)] 97.2 F (36.2 C)  Heart Rate:  [56-81] 63  Resp Rate:  [18-20] 20  BP: (116-162)/(57-72) 158/72    Intake and Output Summary (Last 24 hours)  at Date Time  No intake or output data in the 24 hours ending 07/06/16 1734    Gen: WD F NAD  HEENT: NC/AT, dry MM  Neck: No JVD, Supple  CV: S1 S2 N RRR no M/R/G/C, 2+ ankle edema  Chest: Good effort, mid line surgical scar which is well healed, CTAB, symmetric expansion  Ab: ND NT Soft no HSM +BS  Skin: Dry, intact  Ext: Warm, no cords  Psych: Appropriate mood and affect    Labs:     Recent Labs  Lab 07/06/16  1652 07/06/16  1115 07/04/16  1724   Glucose 125* 150* 102*   BUN 25.3* 26.9* 24.0*   Creatinine 2.1* 2.2* 1.9*   Calcium 8.0* 7.9* 8.1*   Sodium 127* 124* 123*   Potassium 4.9 4.8 4.9   Chloride 98* 96* 92*   CO2 22 23 25    Albumin  --   --  2.4*   Phosphorus  --  3.5  --    Magnesium  --  1.8  --        Recent Labs  Lab 07/06/16  1115 07/04/16  1724   WBC 5.04 4.59   RBC 3.26* 3.62*   Hgb 7.6* 8.5*   Hematocrit 23.2* 26.1*   MCV 71.2* 72.1*   MCH 23.3* 23.5*   MCHC 32.8 32.6   RDW 19* 18*   MPV 9.7 11.9   Platelets 193 183     Radiology:   Radiological Procedure reviewed.   EKG:   Reviewed  Prior Records:   I reviewed the old records.

## 2016-07-06 NOTE — ED Provider Notes (Signed)
Physician/Midlevel provider first contact with patient: 07/06/16 1047         History     Chief Complaint   Patient presents with   . Abnormal Lab     HPI   Patient's daughter states that she had a bypass surgery 2-3 weeks ago at [place] or last several days.  She said chills and seems more cold than she usually is.  She would see a cardiologist 3 days ago and was referred to her primary care doctor to get some blood work done.  She had blood work done 2 days ago, which came back yesterday showing a sodium of 123 and was then told to come to the emergency department for evaluation, treatment.    Pt answers that there are no fevers, nausea, vomiting, diarrhea, constipation, trauma or rash.   No chest pain or shortness of breath.   No new numbness, tingly, focal weakness, speech, vision or gait trouble        Past Medical History:   Diagnosis Date   . Cataracts, bilateral    . Chronic kidney disease    . Diabetes mellitus    . History of CVA (cerebrovascular accident) 03/2013   . History of MI (myocardial infarction)    . 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 Hypercholesteremia    . Hypertension        Past Surgical History:   Procedure Laterality Date   . CORONARY ARTERY BYPASS N/A 06/12/2016    Procedure: Coronary Artery Bypass x 4.;  Surgeon: Bjorn Loser, MD;  Location: [facility] HEART OR;  Service: Cardiothoracic;  Laterality: N/A;  LIMA to LDA  Vein to PDA  Vein to OM  Vein to Diagonal   . ENDOSCOPIC,VEIN HARVEST N/A 06/12/2016    Procedure: Endoscopic, Left Leg Greater Saphenous Vein Harvest;  Surgeon: Bjorn Loser, MD;  Location: [facility] HEART OR;  Service: Cardiothoracic;  Laterality: N/A;  Left Leg Incision (medially, groin to ankle) @ 0742  Vein out of leg @ 0826  Vein prep complete @ 0845  Total time = 63 minutes       Family History   Problem Relation Age of Onset   . No known problems Mother    . No known problems Father        Social  Social History   Substance Use Topics   . Smoking status: Never Smoker    . Smokeless tobacco: Never Used   . Alcohol use No       .     Allergies   Allergen Reactions   . Mosquito (Culex Pipiens) Allergy Skin Test Shortness Of Breath     And  Trouble  breathing   . Lisinopril    . Shellfish-Derived Products Hives     New  allergy  New  allergy   . Penicillins Rash     Swelling,  numbness       Home Medications     Med List Status:  Complete Set By: Coralie Keens, RN at 07/06/2016  5:15 PM                acetaminophen (TYLENOL) 325 MG tablet     Take 2 tablets (650 mg total) by mouth every 4 (four) hours as needed for Pain or Fever.     amLODIPine (NORVASC) 10 MG tablet     Take 1 tablet (10 mg total) by mouth daily.     aspirin EC  325 MG EC tablet     Take 1 tablet (325 mg total) by mouth daily.     bumetanide (BUMEX) 1 MG tablet     Take one-half tablets (0.5 mg total) by mouth 2 (two) times daily.     carvedilol (COREG) 25 MG tablet     Take 25 mg by mouth 2 (two) times daily with meals.        cloNIDine (CATAPRES) 0.2 MG tablet     Take 1 tablet (0.2 mg total) by mouth 2 (two) times daily.     hydrALAZINE (APRESOLINE) 100 MG tablet     Take 1 tablet (100 mg total) by mouth 3 (three) times daily.     ondansetron (ZOFRAN) 4 MG tablet     Take 4 mg by mouth every 8 (eight) hours as needed.         senna-docusate (PERICOLACE) 8.6-50 MG per tablet     Take 2 tablets by mouth 2 (two) times daily.     simvastatin (ZOCOR) 10 MG tablet     Take 10 mg by mouth nightly.     SITagliptin (JANUVIA) 25 MG tablet     Take 1 tablet (25 mg total) by mouth daily.     traMADol (ULTRAM) 50 MG tablet     Take 1 tablet (50 mg total) by mouth every 8 (eight) hours as needed (pain).     vitamin D, ergocalciferol, (DRISDOL) 50000 UNIT Cap     Take 2,000 Units by mouth 2 (two) times daily.           Review of Systems  Chills  Constitutional:  Denies fever   Eyes:  Denies change in visual acuity or eye pain  HENT:  Denies sore throat, trouble swallowing   Respiratory:  Denies cough or shortness of  breath or wheeze   Cardiovascular:  Denies chest pain or edema   GI:  Denies abdominal pain, nausea, vomiting, bloody stools or diarrhea  GU:  Denies dysuria, hematuria, frequency or trouble urinating   Musculoskeletal:  Denies other joint pain  Integument:  Denies rash  or wound  Neurologic:  Denies headache, focal weakness, numbness, tingling, speech, vision or gait changes  Psychiatric:  Denies suicidal ideation.    Physical Exam    BP: 123/57, Heart Rate: 66, Temp: (!) 96 F (35.6 C), Resp Rate: 20, SpO2: 96 %, Weight: 64.4 kg    Physical Exam    Well-healing sternotomy scar well-healing left lower extremity vein harvesting wounds.  Moderate edema left lower extremity  Constitutional:  Well developed, well nourished, no acute distress, not ill appearing   Eyes:   conjunctiva normal, no discharge or redness  HENT:  Atraumatic, external ears normal, nose normal, oropharynx moist, no pharyngeal exudates. Neck- normal range of motion, no tenderness, supple   Respiratory:  No respiratory distress, normal breath sounds, no rales, no wheezing, no rhonchi  Cardiovascular:  Normal rate, normal rhythm, no murmurs, no gallops, no rubs, normal radial pulses and dpp   GI:  Soft, nondistended, nontender, no organomegaly, no mass, no rebound, no guarding   GU:  No costovertebral angle tenderness   Musculoskeletal:  no deformities. Back- no tenderness  Integument:  Well hydrated, no rash   Lymphatic:  No prominent cervical LAD  Neurologic:  Alert & oriented x 3, normal motor function, no focal deficits noted, coordination normal   Psychiatric:  Speech and behavior appropriate       Diagnosis management comments: I, Lucile Crater  Norman Herrlich, MD, have been the primary provider for this patient during this Emergency Dept visit.    Oxygen saturation by pulse oximetry is 95%-100%, Normal, none needed.    DDx:   Dilutional vs renal insufficiency vs adrenal insufficiency cause hyponatremia versus medication induced versus  pseudohyponatremia versus other        Rhythm:  Normal Sinus  Ectopy:  None  Rate:  Normal   61  Conduction:  No blocks  ST Segments:  Normal ST segments  T Waves:  No acute T Wave changes  Axis:  Normal  Q Waves:  None seen  Clinical Impression:  Normal EKG  Interpreted by physician      Critical care time of 32 minutes.  Critical systems at risk neurologic system and metabolic system.  Patient with a critical hyponatremia, severe anemia and renal insufficiency at risk for seizures needed intravenous boluses and rechecks.   This time as is his any bedside procedural time        Patient Progress  Patient progress: stable      MDM and ED Course     ED Medication Orders     Start Ordered     Status Ordering Provider    07/06/16 1305 07/06/16 1304  azithromycin (ZITHROMAX) 500 mg in sodium chloride 0.9 % 250 mL IVPB  Once     Route: Intravenous  Ordered Dose: 500 mg     Last MAR action:  New Bag Madison Hickman JAMES    07/06/16 1053 07/06/16 1052  sodium chloride 0.9 % bolus 1,000 mL  Once     Route: Intravenous  Ordered Dose: 1,000 mL     Last MAR action:  Stopped Pietra Zuluaga, [place]             MDM  Number of Diagnoses or Management Options  Anemia, unspecified type:   Chills:   Chronic renal impairment, unspecified CKD stage:   Hyponatremia:      Amount and/or Complexity of Data Reviewed  Clinical lab tests: ordered and reviewed  Tests in the radiology section of CPT: ordered and reviewed  Obtain history from someone other than the patient: yes  Discuss the patient with other providers: yes    Risk of Complications, Morbidity, and/or Mortality  Presenting problems: high  Diagnostic procedures: moderate  Management options: high    Critical Care  Total time providing critical care: 30-74 minutes        ED Course              Procedures  Results     Procedure Component Value Units Date/Time    Type and Screen [[id number]] Collected:  07/06/16 2054    Specimen:  Blood Updated:  07/06/16 2201     ABO Rh O POS     AB  Screen Gel NEG    Glucose Whole Blood - POCT [[id number]]  (Abnormal) Collected:  07/06/16 2123     Updated:  07/06/16 2127     POCT - Glucose Whole blood 127 (H) mg/dL     Basic Metabolic Panel [[id number]]  (Abnormal) Collected:  07/06/16 2054    Specimen:  Blood Updated:  07/06/16 2127     Glucose 126 (H) mg/dL      BUN 25.2 (H) mg/dL      Creatinine 2.0 (H) mg/dL      Calcium 7.9 (L) mg/dL      Sodium 126 (L) mEq/L      Potassium 4.8 mEq/L  Chloride 99 (L) mEq/L      CO2 22 mEq/L      Anion Gap 5.0    GFR [[id number]] Collected:  07/06/16 2054     Updated:  07/06/16 2127     EGFR 25.1    Ferritin [[id number]]  (Abnormal) Collected:  07/06/16 1115    Specimen:  Blood Updated:  07/06/16 1749     Ferritin 504.89 (H) ng/mL     Hemolysis index [[id number]] Collected:  07/06/16 1115     Updated:  07/06/16 1734     Hemolysis Index 18    GFR [[id number]] Collected:  07/06/16 1652     Updated:  07/06/16 1728     EGFR 22.9    Basic Metabolic Panel [[id number]]  (Abnormal) Collected:  07/06/16 1652    Specimen:  Blood Updated:  07/06/16 1728     Glucose 125 (H) mg/dL      BUN 25.3 (H) mg/dL      Creatinine 2.1 (H) mg/dL      Calcium 8.0 (L) mg/dL      Sodium 127 (L) mEq/L      Potassium 4.9 mEq/L      Chloride 98 (L) mEq/L      CO2 22 mEq/L      Anion Gap 7.0    Procalcitonin [[id number]] Collected:  07/06/16 1115     Updated:  07/06/16 1718    Glucose Whole Blood - POCT [[id number]]  (Abnormal) Collected:  07/06/16 1645     Updated:  07/06/16 1648     POCT - Glucose Whole blood 132 (H) mg/dL     Osmolality [[id number]]  (Abnormal) Collected:  07/06/16 1115    Specimen:  Blood Updated:  07/06/16 1320     Osmolality 267 (L) mosm/kg     Basic Metabolic Panel [[id number]]  (Abnormal) Collected:  07/06/16 1115    Specimen:  Blood Updated:  07/06/16 1150     Glucose 150 (H) mg/dL      BUN 26.9 (H) mg/dL      Creatinine 2.2 (H) mg/dL      Calcium 7.9 (L) mg/dL      Sodium 124 (L) mEq/L      Potassium 4.8 mEq/L      Chloride 96 (L)  mEq/L      CO2 23 mEq/L      Anion Gap 5.0    GFR [[id number]] Collected:  07/06/16 1115     Updated:  07/06/16 1150     EGFR 22.5    Magnesium [[id number]] Collected:  07/06/16 1115    Specimen:  Blood Updated:  07/06/16 1150     Magnesium 1.8 mg/dL     Phosphorus [[id number]] Collected:  07/06/16 1115    Specimen:  Blood Updated:  07/06/16 1150     Phosphorus 3.5 mg/dL     CBC with differential [[id number]]  (Abnormal) Collected:  07/06/16 1115    Specimen:  Blood from Blood Updated:  07/06/16 1130     WBC 5.04 x10 3/uL      Hgb 7.6 (L) g/dL      Hematocrit 23.2 (L) %      Platelets 193 x10 3/uL      RBC 3.26 (L) x10 6/uL      MCV 71.2 (L) fL      MCH 23.3 (L) pg      MCHC 32.8 g/dL      RDW 19 (H) %      MPV 9.7 fL  Neutrophils 68.0 %      Lymphocytes Automated 15.7 %      Monocytes 11.7 %      Eosinophils Automated 4.0 %      Basophils Automated 0.2 %      Immature Granulocyte 0.4 %      Nucleated RBC 0.0 /100 WBC      Neutrophils Absolute 3.43 x10 3/uL      Abs Lymph Automated 0.79 x10 3/uL      Abs Mono Automated 0.59 x10 3/uL      Abs Eos Automated 0.20 x10 3/uL      Absolute Baso Automated 0.01 x10 3/uL      Absolute Immature Granulocyte 0.02 x10 3/uL      Absolute NRBC 0.00 x10 3/uL           Clinical Impression & Disposition     Clinical Impression  Final diagnoses:   Hyponatremia   Chills   Anemia, unspecified type   Chronic renal impairment, unspecified CKD stage        ED Disposition     ED Disposition Condition Date/Time Comment    Admit  Thu Jul 06, 2016  1:06 PM Admitting Physician: Clare Gandy [[id number]]   Diagnosis: Hyponatremia [440102]   Estimated Length of Stay: > or = to 2 midnights   Tentative Discharge Plan?: Home or Self Care [1]   Patient Class: Inpatient [101]             Current Discharge Medication List                    Tressie Stalker, MD  07/06/16 2222

## 2016-07-07 ENCOUNTER — Inpatient Hospital Stay: Payer: Medicare Other

## 2016-07-07 ENCOUNTER — Encounter (INDEPENDENT_AMBULATORY_CARE_PROVIDER_SITE_OTHER): Payer: Self-pay

## 2016-07-07 DIAGNOSIS — I10 Essential (primary) hypertension: Secondary | ICD-10-CM

## 2016-07-07 DIAGNOSIS — Z8673 Personal history of transient ischemic attack (TIA), and cerebral infarction without residual deficits: Secondary | ICD-10-CM

## 2016-07-07 DIAGNOSIS — N189 Chronic kidney disease, unspecified: Secondary | ICD-10-CM

## 2016-07-07 LAB — ECG 12-LEAD
Atrial Rate: 61 {beats}/min
Atrial Rate: 72 {beats}/min
P Axis: 18 degrees
P Axis: 18 degrees
P-R Interval: 190 ms
P-R Interval: 190 ms
Q-T Interval: 418 ms
Q-T Interval: 378 ms
QRS Duration: 88 ms
QRS Duration: 86 ms
QTC Calculation (Bezet): 420 ms
QTC Calculation (Bezet): 413 ms
R Axis: 19 degrees
R Axis: 41 degrees
T Axis: 23 degrees
T Axis: 50 degrees
Ventricular Rate: 61 {beats}/min
Ventricular Rate: 72 {beats}/min

## 2016-07-07 LAB — CBC AND DIFFERENTIAL
Absolute NRBC: 0 10*3/uL
Basophils Absolute Automated: 0.02 10*3/uL (ref 0.00–0.20)
Basophils Automated: 0.4 %
Eosinophils Absolute Automated: 0.22 10*3/uL (ref 0.00–0.70)
Eosinophils Automated: 4.3 %
Hematocrit: 22.9 % — ABNORMAL LOW (ref 37.0–47.0)
Hgb: 7.4 g/dL — ABNORMAL LOW (ref 12.0–16.0)
Immature Granulocytes Absolute: 0.02 10*3/uL
Immature Granulocytes: 0.4 %
Lymphocytes Absolute Automated: 0.7 10*3/uL (ref 0.50–4.40)
Lymphocytes Automated: 13.7 %
MCH: 23.2 pg — ABNORMAL LOW (ref 28.0–32.0)
MCHC: 32.3 g/dL (ref 32.0–36.0)
MCV: 71.8 fL — ABNORMAL LOW (ref 80.0–100.0)
MPV: 10 fL (ref 9.4–12.3)
Monocytes Absolute Automated: 0.55 10*3/uL (ref 0.00–1.20)
Monocytes: 10.7 %
Neutrophils Absolute: 3.61 10*3/uL (ref 1.80–8.10)
Neutrophils: 70.5 %
Nucleated RBC: 0 /100 WBC (ref 0.0–1.0)
Platelets: 193 10*3/uL (ref 140–400)
RBC: 3.19 10*6/uL — ABNORMAL LOW (ref 4.20–5.40)
RDW: 19 % — ABNORMAL HIGH (ref 12–15)
WBC: 5.12 10*3/uL (ref 3.50–10.80)

## 2016-07-07 LAB — IRON PROFILE
Iron Saturation: 21 % (ref 15–50)
Iron: 29 ug/dL — ABNORMAL LOW (ref 40–145)
TIBC: 136 ug/dL — ABNORMAL LOW (ref 265–497)
UIBC: 107 ug/dL — ABNORMAL LOW (ref 126–382)

## 2016-07-07 LAB — BASIC METABOLIC PANEL
Anion Gap: 6 (ref 5.0–15.0)
BUN: 25.3 mg/dL — ABNORMAL HIGH (ref 7.0–19.0)
CO2: 21 mEq/L — ABNORMAL LOW (ref 22–29)
Calcium: 7.8 mg/dL — ABNORMAL LOW (ref 8.5–10.5)
Chloride: 100 mEq/L (ref 100–111)
Creatinine: 1.9 mg/dL — ABNORMAL HIGH (ref 0.6–1.0)
Glucose: 115 mg/dL — ABNORMAL HIGH (ref 70–100)
Potassium: 4.6 mEq/L (ref 3.5–5.1)
Sodium: 127 mEq/L — ABNORMAL LOW (ref 136–145)

## 2016-07-07 LAB — TSH: TSH: 1.1 u[IU]/mL (ref 0.35–4.94)

## 2016-07-07 LAB — GFR: EGFR: 26.6

## 2016-07-07 LAB — GLUCOSE WHOLE BLOOD - POCT
Whole Blood Glucose POCT: 120 mg/dL — ABNORMAL HIGH (ref 70–100)
Whole Blood Glucose POCT: 135 mg/dL — ABNORMAL HIGH (ref 70–100)
Whole Blood Glucose POCT: 144 mg/dL — ABNORMAL HIGH (ref 70–100)
Whole Blood Glucose POCT: 148 mg/dL — ABNORMAL HIGH (ref 70–100)

## 2016-07-07 LAB — CORTISOL: Cortisol: 7.9 ug/dL

## 2016-07-07 LAB — OSMOLALITY, URINE: Urine Osmolality: 191 mosm/kg — ABNORMAL LOW (ref 300–1094)

## 2016-07-07 LAB — PROCALCITONIN: Procalcitonin: <0.1 (ref 0.0–0.1)

## 2016-07-07 LAB — SODIUM, URINE, RANDOM: Urine Sodium Random: 27 mEq/L

## 2016-07-07 LAB — HEMOLYSIS INDEX: Hemolysis Index: 2 (ref 0–18)

## 2016-07-07 MED ORDER — SITAGLIPTIN PHOSPHATE 50 MG PO TABS
25.0000 mg | ORAL_TABLET | Freq: Every day | ORAL | Status: DC
Start: 2016-07-07 — End: 2016-07-08
  Administered 2016-07-07 – 2016-07-08 (×2): 25 mg via ORAL
  Filled 2016-07-07 (×2): qty 1

## 2016-07-07 MED ORDER — PANTOPRAZOLE SODIUM 40 MG PO TBEC
40.0000 mg | DELAYED_RELEASE_TABLET | Freq: Every morning | ORAL | Status: DC
Start: 2016-07-07 — End: 2016-07-08
  Administered 2016-07-07 – 2016-07-08 (×2): 40 mg via ORAL
  Filled 2016-07-07 (×2): qty 1

## 2016-07-07 MED ORDER — FUROSEMIDE 10 MG/ML IJ SOLN
40.0000 mg | Freq: Every day | INTRAMUSCULAR | Status: DC
Start: 2016-07-07 — End: 2016-07-08
  Administered 2016-07-07 – 2016-07-08 (×2): 40 mg via INTRAVENOUS
  Filled 2016-07-07 (×2): qty 4

## 2016-07-07 NOTE — Progress Notes (Signed)
TCM Medicare Navigator    07/07/16 - Patient currently admitted to [facility] under inpatient status. Medicare Focus Navigator will continue to monitor patient status for 30 days following index admission which occurred dates 06/08/16 to 06/19/16.    Amanda Pea, Rafael Hernandez Medicare Focus Navigator  [phone]

## 2016-07-07 NOTE — Plan of Care (Signed)
Problem: Fluid and Electrolyte Imbalance/ Endocrine  Goal: Fluid and electrolyte balance are achieved/maintained  Outcome: Progressing      Problem: Nutrition  Goal: Nutritional intake is adequate  Outcome: Progressing      Problem: Safety  Goal: Patient will be free from injury during hospitalization  Outcome: Progressing   07/07/16 1032   Goal/Interventions addressed this shift   Patient will be free from injury during hospitalization  Assess patient's risk for falls and implement fall prevention plan of care per policy;Provide and maintain safe environment;Use appropriate transfer methods;Ensure appropriate safety devices are available at the bedside;Include patient/ family/ care giver in decisions related to safety;Hourly rounding;Assess for patients risk for elopement and implement [place] per policy;Provide alternative method of communication if needed (communication boards, writing)       Problem: Pain  Goal: Pain at adequate level as identified by patient  Outcome: Progressing  Pt denies any pain will medicated as needed.    Comments: Hourly rounding was started and was emphasized per Hospital protocol. Bed on lowest position and locked. assisted if needed.

## 2016-07-07 NOTE — UM Notes (Signed)
Pnt presented to ED on 07/06/16 and was transferred to tele unit as  Inpatient on 07/06/16    65 yo female with bypass surgery 2-3 weeks ago at [place] or last several days.  She said chills and seems more cold than she usually is.  She would see a cardiologist 3 days ago and was referred to her primary care doctor to get some blood work done.  She had blood work done 2 days ago, which came back yesterday showing a sodium of 123 and was then told to come to the emergency department for evaluation, treatment.    Past Medical History:   Diagnosis Date   . Cataracts, bilateral    . Chronic kidney disease    . Diabetes mellitus    . History of CVA (cerebrovascular accident) 03/2013   . History of MI (myocardial infarction)    . 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 Hypercholesteremia    . Hypertension      Past Surgical History:   Procedure Laterality Date   . CORONARY ARTERY BYPASS N/A 06/12/2016    Procedure: Coronary Artery Bypass x 4.;  Surgeon: Bjorn Loser, MD;  Location: [facility] HEART OR;  Service: Cardiothoracic;  Laterality: N/A;  LIMA to LDA  Vein to PDA  Vein to OM  Vein to Diagonal   . ENDOSCOPIC,VEIN HARVEST N/A 06/12/2016    Procedure: Endoscopic, Left Leg Greater Saphenous Vein Harvest;  Surgeon: Bjorn Loser, MD;  Location: [facility] HEART OR;  Service: Cardiothoracic;  Laterality: N/A;  Left Leg Incision (medially, groin to ankle) @ 0742  Vein out of leg @ 0826  Vein prep complete @ 0845  Total time = 63 minutes     VS:    96 F (35.6 C) Oral  96 % -- 20 123/57 -- -- -- -- 5     Abnormal Labs: hgb 7.6, hct 23.2, rbc 3.26, glucose 150, bun 26.9, cr 2.2, cal 7.9, na 124, chl 96,    Urine  protein >500, glucose >50,     Diagnostics:  CXR: Small left pleural effusion. Left lung base consolidation is  suspicious for pneumonia. Clinical correlation and follow-up until  Resolution    EKG NORMAL SINUS RHYTHM  NORMAL ECG  WHEN COMPARED WITH ECG OF 20-Jun-2016 01:53,  NO SIGNIFICANT CHANGE WAS FOUND    ED Meds: nacl  bolus iv, zithromax iv.    Patient transferred to tele unit as  Inpatient on 07/06/16 for hyponatremia.     Assessment and Plan per MD:  65 year old female with a recent history of 4.  Non-STEMI with coronary artery bypass graft discharged 3 weeks ago.  According to daughter states that she was doing fairly okay up until a week ago when she noticed her to be getting more and more weak and for the last couple of days they noticed her to be feeling more cold than usual and having chills.  Deny noticing her having any fever, no productive sputum.  She was having increasing pedal edema with having trouble walking due to the not sure whether this is from the discomfort from the pedal edema or from weakness.  Patient was too sleepy to give me any history.  Most of the history is apparent from the daughter at bedside.    Orders:  Nacl iv cont 75/hr  norvasc po  Aspirin po  Coreg po  Catapres po  Apresoline po  Bmp  Stool occult blood  Consistent  carb and renal diet  Insulin sc  I/O  Tele  VS q 4 hrs

## 2016-07-07 NOTE — Progress Notes (Signed)
Transitional Care Management    Writer spoke with pt care nurse Philip to confirm if PNA diagnosis accurate. RN states no PNA. RN states pt is on IV lasix d/t fluid overload for hyponatremia treatment. Pt is a 30 day readmit pt. Writer will continue to eval pt for disposition at discharge and eligibility for TCM program.    Gae Dry, RN MSN  [facility]  Case Manager  [phone]

## 2016-07-07 NOTE — Progress Notes (Deleted)
Home Health Referral          Referral from [facility] (Case Manager) for home health care upon discharge.    By [employer], the patient has the right to freely choose a home care provider.  Arrangements have been made with:     A company of the patients choosing. We have supplied the patient with a listing of providers in your area who asked to be included and participate in Medicare.   [facility], a home care agency that provides both adult home care services which is a wholly owned and operated by [employer] and participates in [employer]   The preferred provider of your insurance company. Choosing a home care provider other than your insurance company's preferred provider may affect your insurance coverage.    The Home Health Care Referral Form acknowledging the voluntary selection of the home care company has been completed, signed, and is on file.      Home Health Discharge Information     Your doctor has ordered Skilled Nursing, Physical Therapy and Occupational Therapy in-home service(s) for you while you recuperate at home, to assist you in the transition from hospital to home.      The agency that you or your representative chose to provide the service:  Name of [place]: [place]. ([phone])]    Patient is currently active with above home health agency.  Verified and resumption of care orders sent.    The above services were set up by:  Theador Hawthorne  ([place])   Phone      [phone]                                       Additional comments:   If you have not heard from your home health agency within 24-48 hours after discharge please call your agency to arrange a time for your first visit.  For any scheduling concerns or questions related to home health, such as time or date please contact your home health agency at the number listed above.     Signed by: Theador Hawthorne RN, BSN  Date Time: 07/07/16 10:49 AM

## 2016-07-07 NOTE — Progress Notes (Addendum)
[facility] Hospitalist Daily Progress Note        Date Time: 07/07/2016  3:15 PM  Patient Name:Jacqueline Weber  AFB:[id number]  PCP: John Giovanni, MD  Attending Physician:Abdo Denault Sherre Poot M.D.      Chief Complaint:      Chief Complaint   Patient presents with   . Abnormal Lab       Subjective:     Jacqueline Weber is better than yesterday.  Seen with her daughter at bedside who assisted with interpretation  Feels stronger; continue to complain of left lower extremity pain  Further abdominal pain, nausea, vomiting or diarrhea Denies any chest pain, cough, shortness of breath  Denies any abdominal pain - No Nausea, vomiting or diarrhea  No new weakness tingling or numbness.  Assessment/Plan   Generalized weakness, dizziness, most likely secondary to dehydration, hyponatremia  Serum sodium improved at 127; appreciate nephrology input and recommendations  IV fluids have been discontinued and patient started on 40 mg of IV Lasix daily  Maintain intake and output with daily weights  Repeat renal panel in the morning  Chronic kidney disease stage IV (baseline creatinine 1.9-2.6 )  Currently appears at baseline  Left lower extremity pain and edema:  We will repeat Doppler to rule out deep vein thrombosis  Left lower lobe atelectasis: No evidence of pneumonia, procalcitonin negative  Continue incentive spirometer  History of coronary artery disease and recent coronary artery bypass graft on June 12, 2016   Continue aspirin, beta blocker and statin  Uncontrolled hypertension: Improved.  Continue Norvasc, Coreg, Catapres and hydralazine  History of CVA in 2014 with mild left lower extremity weakness :  At baseline.  Continue aspirin, statin  Anemia of chronic disease:  Hemoglobin 7.4.  His only slightly dropped from yesterday of 7.6  Repeat complete blood count in the morning  Stool occult blood pending.  No evidence of acute ongoing bleeding  Need outpatient follow-up with  gastroenterology  Hyperlipidemia: Continue Zocor  Non Insulin-dependent diabetes mellitus: Hemoglobin A1c 7.9  Restart januvia and monitor for hypoglycemia  Continue insulin sliding scale  Disposition: PT, OT evaluation  Patient lives with her daughter  DVT Prohylaxis: Lovenox  Code Status: Full Code   Prognosis: Guarded  Type of Admission:Inpatient  Central Line/Foley Catheter/PICC line status: none  Estimated Length of Stay (including stay in the ER receiving treatment): > 2 midnights  Medical Necessity for stay: Hyponatremia, generalized weakness    Allergies:     Allergies   Allergen Reactions   . Mosquito (Culex Pipiens) Allergy Skin Test Shortness Of Breath     And  Trouble  breathing   . Lisinopril    . Shellfish-Derived Products Hives     New  allergy  New  allergy   . Penicillins Rash     Swelling,  numbness       Physical Exam:   Vitals reviewed:   height is 1.524 m (5') and weight is 68 kg (150 lb). Her temperature is 97 F (36.1 C). Her blood pressure is 119/61 and her pulse is 66. Her respiration is 14 and oxygen saturation is 94%.   Body mass index is 29.29 kg/m.  Vitals:    07/07/16 0532 07/07/16 0835 07/07/16 0931 07/07/16 1244   BP: 133/70 151/65 131/64 119/61   Pulse: 71 68 63 66   Resp: 17 18 14     Temp: 98.1 F (36.7 C)  97.7 F (36.5 C) 97 F (36.1 C)   TempSrc:  Oral    SpO2: 94%  96% 94%   Weight: 68 kg (150 lb)      Height:         Intake and Output Summary (Last 24 hours) at Date Time    Intake/Output Summary (Last 24 hours) at 07/07/16 1515  Last data filed at 07/07/16 1300   Gross per 24 hour   Intake             1260 ml   Output             1500 ml   Net             -240 ml       Exam  Awake Alert, Oriented *3, No new F.N deficits, Normal affect  NC.AT,PERRAL  Supple Neck,No JVD, No cervical lymphadenopathy appriciated.   Symmetrical Chest wall movement, Good air movement bilaterally, bibasilar crackles  RRR,No Gallops,Rubs or new Murmurs, No Parasternal Heave  +ve B.Sounds, Abd  Soft, Non tender, No organomegaly appriciated, No rebound -guarding or rigidity.  1 plus  edema bilateral lower extremities, left greater than right, No new Rash or bruise    Consult Input/Plan       [facility] FACE-TO-FACE (FTF) ENCOUNTER    Medications:     Current Facility-Administered Medications   Medication Dose Route Frequency Last Rate Last Dose   . acetaminophen (TYLENOL) tablet 650 mg  650 mg Oral Q4H PRN       . alum & mag hydroxide-simethicone (MAALOX PLUS) 200-200-20 mg/5 mL suspension 15 mL  15 mL Oral Q4H PRN       . amLODIPine (NORVASC) tablet 10 mg  10 mg Oral Daily   10 mg at 07/07/16 0953   . aspirin EC EC tablet 325 mg  325 mg Oral Daily   325 mg at 07/07/16 0953   . carvedilol (COREG) tablet 25 mg  25 mg Oral BID Meals   25 mg at 07/07/16 0834   . cloNIDine (CATAPRES) tablet 0.2 mg  0.2 mg Oral BID   0.2 mg at 07/07/16 0834   . dextrose (GLUCOSE) 40 % oral gel 15 g of glucose  15 g of glucose Oral PRN        And   . dextrose (D10W) 10% bolus 125 mL  125 mL Intravenous PRN        And   . glucagon (rDNA) (GLUCAGEN) injection 1 mg  1 mg Intramuscular PRN       . furosemide (LASIX) injection 40 mg  40 mg Intravenous Daily   40 mg at 07/07/16 1138   . hydrALAZINE (APRESOLINE) tablet 100 mg  100 mg Oral TID   100 mg at 07/07/16 0953   . insulin lispro (HumaLOG) injection 1-3 Units  1-3 Units Subcutaneous QHS PRN       . insulin lispro (HumaLOG) injection 1-5 Units  1-5 Units Subcutaneous TID AC PRN       . naloxone (NARCAN) injection 0.2 mg  0.2 mg Intravenous PRN       . ondansetron (ZOFRAN-ODT) disintegrating tablet 4 mg  4 mg Oral Q6H PRN        Or   . ondansetron (ZOFRAN) injection 4 mg  4 mg Intravenous Q6H PRN       . polyethylene glycol (MIRALAX) packet 17 g  17 g Oral Daily   17 g at 07/07/16 0953   . senna-docusate (PERICOLACE) 8.6-50 MG per tablet 2 tablet  2 tablet  Oral BID   2 tablet at 07/07/16 0953   . simvastatin (ZOCOR) tablet 10 mg  10 mg Oral QHS   10 mg at 07/06/16 2249   .  traMADol (ULTRAM) tablet 50 mg  50 mg Oral Q8H PRN   50 mg at 07/06/16 2305          Labs:reviewed     Results     Procedure Component Value Units Date/Time    Procalcitonin [[id number]] Collected:  07/06/16 1115     Updated:  07/07/16 1355     Procalcitonin <0.1    Glucose Whole Blood - POCT [[id number]]  (Abnormal) Collected:  07/07/16 1037     Updated:  07/07/16 1039     POCT - Glucose Whole blood 135 (H) mg/dL     Cortisol [[id number]] Collected:  07/07/16 0517    Specimen:  Blood Updated:  07/07/16 0805     Cortisol 7.9 ug/dL     Glucose Whole Blood - POCT [[id number]]  (Abnormal) Collected:  07/07/16 0651     Updated:  07/07/16 0654     POCT - Glucose Whole blood 120 (H) mg/dL     TSH [[id number]] Collected:  07/07/16 0517    Specimen:  Blood Updated:  07/07/16 0639     Thyroid Stimulating Hormone 1.10 uIU/mL     Basic Metabolic Panel [[id number]]  (Abnormal) Collected:  07/07/16 0517    Specimen:  Blood Updated:  07/07/16 0614     Glucose 115 (H) mg/dL      BUN 25.3 (H) mg/dL      Creatinine 1.9 (H) mg/dL      Calcium 7.8 (L) mg/dL      Sodium 127 (L) mEq/L      Potassium 4.6 mEq/L      Chloride 100 mEq/L      CO2 21 (L) mEq/L      Anion Gap 6.0    GFR [[id number]] Collected:  07/07/16 0517     Updated:  07/07/16 0614     EGFR 26.6    CBC with differential [[id number]]  (Abnormal) Collected:  07/07/16 0517    Specimen:  Blood from Blood Updated:  07/07/16 0555     WBC 5.12 x10 3/uL      Hgb 7.4 (L) g/dL      Hematocrit 22.9 (L) %      Platelets 193 x10 3/uL      RBC 3.19 (L) x10 6/uL      MCV 71.8 (L) fL      MCH 23.2 (L) pg      MCHC 32.3 g/dL      RDW 19 (H) %      MPV 10.0 fL      Neutrophils 70.5 %      Lymphocytes Automated 13.7 %      Monocytes 10.7 %      Eosinophils Automated 4.3 %      Basophils Automated 0.4 %      Immature Granulocyte 0.4 %      Nucleated RBC 0.0 /100 WBC      Neutrophils Absolute 3.61 x10 3/uL      Abs Lymph Automated 0.70 x10 3/uL      Abs Mono Automated 0.55 x10 3/uL      Abs Eos  Automated 0.22 x10 3/uL      Absolute Baso Automated 0.02 x10 3/uL      Absolute Immature Granulocyte 0.02 x10 3/uL      Absolute NRBC 0.00 x10 3/uL  IRON PROFILE [[id number]] Collected:  07/07/16 0517     Updated:  07/07/16 0517    Sodium, urine, random [[id number]] Collected:  07/06/16 2323    Specimen:  Urine Updated:  07/07/16 0256     Sodium, UR 27 mEq/L     Osmolality, Urine [[id number]]  (Abnormal) Collected:  07/06/16 2322    Specimen:  Urine Updated:  07/07/16 0003     Osmolality, UR 191 (L) mosm/kg     UA with reflex to micro (pts  3 + yrs) [[id number]]  (Abnormal) Collected:  07/06/16 2322    Specimen:  Urine Updated:  07/06/16 2342     Urine Type Clean Catch     Color, UA Yellow     Clarity, UA Clear     Specific Gravity UA 1.010     Urine pH 6.0     Leukocyte Esterase, UA Negative     Nitrite, UA Negative     Protein, UR >=500 (A)     Glucose, UA 50 (A)     Ketones UA Negative     Urobilinogen, UA Normal mg/dL      Bilirubin, UA Negative     Blood, UA Negative     RBC, UA 3 - 5 /hpf      WBC, UA 0-5 /hpf      Squamous Epithelial Cells, Urine 0-5 /hpf     Type and Screen [[id number]] Collected:  07/06/16 2054    Specimen:  Blood Updated:  07/06/16 2201     ABO Rh O POS     AB Screen Gel NEG    Glucose Whole Blood - POCT [[id number]]  (Abnormal) Collected:  07/06/16 2123     Updated:  07/06/16 2127     POCT - Glucose Whole blood 127 (H) mg/dL     Basic Metabolic Panel [[id number]]  (Abnormal) Collected:  07/06/16 2054    Specimen:  Blood Updated:  07/06/16 2127     Glucose 126 (H) mg/dL      BUN 25.2 (H) mg/dL      Creatinine 2.0 (H) mg/dL      Calcium 7.9 (L) mg/dL      Sodium 126 (L) mEq/L      Potassium 4.8 mEq/L      Chloride 99 (L) mEq/L      CO2 22 mEq/L      Anion Gap 5.0    GFR [[id number]] Collected:  07/06/16 2054     Updated:  07/06/16 2127     EGFR 25.1    Ferritin [[id number]]  (Abnormal) Collected:  07/06/16 1115    Specimen:  Blood Updated:  07/06/16 1749     Ferritin 504.89 (H) ng/mL      Hemolysis index [[id number]] Collected:  07/06/16 1115     Updated:  07/06/16 1734     Hemolysis Index 18    GFR [[id number]] Collected:  07/06/16 1652     Updated:  07/06/16 1728     EGFR 17.4    Basic Metabolic Panel [[id number]]  (Abnormal) Collected:  07/06/16 1652    Specimen:  Blood Updated:  07/06/16 1728     Glucose 125 (H) mg/dL      BUN 25.3 (H) mg/dL      Creatinine 2.1 (H) mg/dL      Calcium 8.0 (L) mg/dL      Sodium 127 (L) mEq/L      Potassium 4.9 mEq/L      Chloride 98 (L) mEq/L  CO2 22 mEq/L      Anion Gap 7.0    Glucose Whole Blood - POCT [[id number]]  (Abnormal) Collected:  07/06/16 1645     Updated:  07/06/16 1648     POCT - Glucose Whole blood 132 (H) mg/dL           Rads:reviewed   Radiological Procedure reviewed.  Radiology Results (24 Hour)     Procedure Component Value Units Date/Time    US Venous Duplex Doppler Leg Left [[id number]] Collected:  07/07/16 1407    Order Status:  Completed Updated:  07/07/16 1413    Narrative:       HISTORY:  Left leg pain    FINDINGS: Duplex evaluation of the deep venous system of the left lower  extremity was performed.   The common femoral vein, superficial femoral  vein and popliteal vein were visualized and appear unremarkable.  These  veins are compressible with no evidence of thrombus.  Doppler  demonstrates patency with normal directional and phasic flow.   Appropriate augmentation was observed.  The proximal deep femoral vein  is visualized and appears clear.  The portions of the posterior tibial  and peroneal veins that were seen compress normally with no thrombus  noted.  There is subcutaneous edema along the left calf and popliteal  region.      Impression:        No evidence of deep venous thrombosis involving the left  lower extremity.    Rosiland Oz, MD   07/07/2016 2:09 PM            Multi-Disciplinary Care Input:   Case management notes: No Patient Care Coordination Note on file.     PT/OT/ST notes:          Signed by: Dirk Dress  07/07/2016  3:15 PM         This note was generated by the Epic EMR system/ Dragon speech recognition and may contain inherent errors or omissions not intended by the user. Grammatical errors, random word insertions, deletions, pronoun errors and incomplete sentences are occasional consequences of this technology due to software limitations. Not all errors are caught or corrected. If there are questions or concerns about the content of this note or information contained within the body of this dictation they should be addressed directly with the author for clarification

## 2016-07-07 NOTE — Progress Notes (Signed)
07/07/16 1037   Patient Type   Within 30 Days of Previous Admission? Yes   Healthcare Decisions   Interviewed: Family;Patient   Orientation/Decision Making Abilities of Patient Alert and Oriented x3, able to make decisions   Advance Directive Patient does not have advance directive   Healthcare Agent Appointed Yes   Prior to admission   Prior level of function Needs assistance with ADLs;Ambulates with assistive device   Type of Residence Private residence   Home Layout Multi-level   Have running water, electricity, heat, etc? Yes   Living Arrangements Children   How do you get to your MD appointments? Children   How do you get your groceries? Children   Who fixes your meals? Children   Who does your laundry? Children   Who picks up your prescriptions? [facility]   Dressing Needs assistance   Grooming Independent   Feeding Independent   Bathing Needs assistance   Toileting Needs assistance   DME Currently at [facility] PT/OT/Speech   Name of [place]- Notified [facility] to resume services    Adult Protective Services (APS) involved? No   Discharge [place]   Patient expects to be discharged to: Home w/HHC likley . Notified [facility] of [place] needs.    Follow up appointment scheduled? (To be arranged prior to [place])   Mode of transportation: Private car (family member)   Consults/Providers   PT Evaluation Needed 1   OT Evalulation Needed 1   SLP Evaluation Needed 2   Outcome Palliative Care Screen Screened but did not meet criteria for intervention   Correct PCP listed in Epic? Yes

## 2016-07-07 NOTE — PT Eval Note (Signed)
[facility]  [place]    Department of Rehabilitation  [phone]    Physical Therapy Evaluation    Patient: Jacqueline Weber    MRN#: [id number]     [id number]/[id number]    Time of treatment: Time Calculation  PT Received On: 07/07/16  Start Time: 1516  Stop Time: 1530  Time Calculation (min): 14 min    PT Visit Number: 1    Consult received for Jacqueline Weber for PT Evaluation and Treatment.  Patient's medical condition is appropriate for Physical therapy intervention at this time.      Assessment:   Jacqueline Weber is a 65 y.o. female admitted 07/06/2016.  Pt's functional mobility is impacted by:  decreased activity tolerance and decreased balance.  There are a few comorbidities or other factors that affect plan of care and require modification of task including: assistive device needed for mobility, has stairs to manage and sternal precautions.  Standardized tests and exams incorporated into evaluation include AMPAC mobility.  Pt demonstrates a stable clinical presentation due to no adverse response to activity.   Pt would continue to benefit from PT to address these deficits and increase functional independence.     Complexity Level Hx and Co  morbidites Examination Clinical Decision Making Clinical Presentation   Low no impact 1-2 elements Limited options Stable   Moderate   1-2 factors 3 or more   Several options Evolving, plan may alter   High 3 or more 4 or more Multiple options Unstable, unpredictable       Impairments: Assessment: Decreased endurance/activity tolerance;Decreased functional mobility;Decreased balance.     Therapy Diagnosis: decreased functional mobility  and decreased endurance/ activity engagement due to current illness. Without therapy interventions, patient is at risk for falls, dependence on caregivers for mobility and dependence on caregivers for ADL's.    Rehabilitation Potential: Prognosis: Good;With continued PT status post acute  discharge      Plan:    Treatment/Interventions: Gait training;Neuromuscular re-education;Stair training;Functional transfer training;LE strengthening/ROM;Endurance training;Bed mobility PT Frequency: 2-3x/wk    Risks/Benefits/POC Discussed with Pt/Family: With patient/family          Goals:   Goals  Goal Formulation: With patient/family  Time for Goal Acheivement: By time of discharge  Goals: Select goal  Pt Will Go Supine To Sit: with supervision;to maximize functional mobility and independence;5 visits  Pt Will Perform Sit to Stand: with supervision;to maximize functional mobility and independence;5 visits  Pt Will Ambulate: 151-200 feet;with rolling walker;with supervision;to maximize functional mobility and independence;5 visits  Pt Will Go Up / Down Stairs: 6-10 stairs;with minimal assist;to maximize functional mobility and independence;5 visits      Discharge Recommendations:   Based on today's session patient's discharge recommendation is the following: Home with supervision;Home with home health PT       Precautions and Contraindications: Fall risk  Precautions  Sternal Precautions: no raising elbows higher than shoulders;no lifting;no pushing;no pulling (CABG in October 2017)    Medical Diagnosis: Chills [R68.83]  Hyponatremia [E87.1]  Anemia, unspecified type [D64.9]  Chronic renal impairment, unspecified CKD stage [N18.9]    History of Present Illness: Jacqueline Weber is a 65 y.o. female admitted on 07/06/2016 with LE edema.    Patient Active Problem List   Diagnosis   . Type 2 diabetes mellitus without complication   . HTN (hypertension) urgency   . Hyperlipidemia   . History of CVA (cerebrovascular accident)   . Elevated brain natriuretic peptide (BNP) level   .  Hyponatremia   . Renal insufficiency   . NSTEMI (non-ST elevated myocardial infarction)   . Acute hyponatremia   . Chronic renal impairment, unspecified CKD stage        Past Medical/Surgical History:  Past Medical History:   Diagnosis Date   .  Cataracts, bilateral    . Chronic kidney disease    . Diabetes mellitus    . History of CVA (cerebrovascular accident) 03/2013   . History of MI (myocardial infarction)    . 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 Hypercholesteremia    . Hypertension       Past Surgical History:   Procedure Laterality Date   . CORONARY ARTERY BYPASS N/A 06/12/2016    Procedure: Coronary Artery Bypass x 4.;  Surgeon: Bjorn Loser, MD;  Location: [facility] HEART OR;  Service: Cardiothoracic;  Laterality: N/A;  LIMA to LDA  Vein to PDA  Vein to OM  Vein to Diagonal   . ENDOSCOPIC,VEIN HARVEST N/A 06/12/2016    Procedure: Endoscopic, Left Leg Greater Saphenous Vein Harvest;  Surgeon: Bjorn Loser, MD;  Location: [facility] HEART OR;  Service: Cardiothoracic;  Laterality: N/A;  Left Leg Incision (medially, groin to ankle) @ 0742  Vein out of leg @ 0826  Vein prep complete @ 0845  Total time = 63 minutes       Social History:  Prior Level of Function  Prior level of function: Needs assistance with ADLs, Ambulates with assistive device  Assistive Device: Front wheel walker  Baseline Activity Level: Household ambulation  DME Currently at Home: Front wheel walker  Home Living Arrangements  Living Arrangements: Children (Daughter is with pt at all times)  Type of Home: House  Home Layout: Multi-level, Bed/bath upstairs  Bathroom Shower/Tub: Engineer, materials: Civil engineer, contracting  DME Currently at Home: Front wheel walker      Subjective:    Patient is agreeable to participation in the therapy session. Nursing clears patient for therapy. No c/o pain currently           Objective:   Observation of Patient/Vital Signs:  Patient is in bed with telemetry in place.    Cognition/Neuro Status  Arousal/Alertness: Appropriate responses to stimuli  Attention Span: Appears intact  Following Commands: Follows one step commands without difficulty  Safety Awareness: minimal verbal instruction  Insights: Fully aware of deficits  Behavior:  attentive;calm;cooperative    Sensation: intact light touch BLE    Gross ROM  Right Lower Extremity ROM: within functional limits  Left Lower Extremity ROM: within functional limits  Gross Strength  Right Lower Extremity Strength: 4/5  Left Lower Extremity Strength: 4/5       Functional Mobility  Supine to Sit: Minimal Assist;HOB raised (Assistance for trunk due to sternal precautions)  Scooting to EOB: Moderate Assist (due to bed surface)  Sit to Stand: Minimal Assist  Stand to Sit: Supervision     Locomotion  Ambulation: Contact Guard Assist;with front-wheeled walker  Ambulation Distance (Feet): 120 Feet  Pattern: decreased cadence     Balance  Balance: needs focused assessment  Sitting - Static: Good  Sitting - Dynamic: Good  Standing - Static: Fair  Standing - Dynamic: Fair    Participation and Endurance  Participation Effort: good  Endurance: Tolerates 10 - 20 min exercise with multiple rests. Reported slight dizziness upon standing, improved with static standing. No SOB noted.     AM-PACT Inpatient Short Forms  Inpatient  AM-PACT Performed? (PT): Basic Mobility Inpatient Short Form  AM-PACT "6 Clicks" Basic Mobility Inpatient Short Form  Turning Over in Bed: A little  Sitting Down On/Standing From Armchair: A little  Lying on Back to Sitting on Side of Bed: A lot  Assist Moving to/from Bed to Chair: A little  Assist to Walk in Hospital Room: A little  Assist to Climb 3-5 Steps with Railing: A little  PT Basic Mobility Raw Score: 17  CMS 0-100% Score: 50.57%    Treatment Activities: Educated in and performed AP, LAQ and seated marching and encouraged to perform LE therex throughout the day to decrease effects of immobility. Encouraged to ambulate with supervision while in hospital for same. Encouraged to sit OOB/EOB as tolerated for pulmonary hygiene. Instructed not to get up without assistance for safety. Pt verbalized understanding for all education provided.  Educated the patient to role of physical therapy,  plan of care, goals of therapy and HEP, safety with mobility and ADLs.    At end of session pt seated upright at EOB, call bell and items in reach. RN aware. Daughter present.      Ladon Applebaum, PT, DPT  Pager #: [phone]

## 2016-07-07 NOTE — Discharge Instr - AVS First Page (Addendum)
Home Health Discharge Information     Your doctor has ordered Skilled Nursing, Physical Therapy and Occupational Therapy in-home service(s) for you while you recuperate at home, to assist you in the transition from hospital to home.      The agency that you or your representative chose to provide the service:  Name of [place]: [place]. ([phone])]    Patient is currently active with above home health agency.  Verified and resumption of care orders sent.    The above services were set up by:  Theador Hawthorne  ([place])   Phone      [phone]                                       Additional comments:   If you have not heard from your home health agency within 24-48 hours after discharge please call your agency to arrange a time for your first visit.  For any scheduling concerns or questions related to home health, such as time or date please contact your home health agency at the number listed above.     Signed by: Theador Hawthorne RN, BSN  Date Time: 07/07/16 10:49 AM      Follow with Primary MD John Giovanni, MD in 7 days     Get CBC, CMP, checked in 7 days by Primary MD and again as instructed by your Primary MD. Get Medicines reviewed and adjusted.    Please request your Prim.MD to go over all Hospital Tests and Procedure/Radiological results at the follow up, please get all Hospital records sent to your Prim MD by signing hospital release before you go home.      Activity: Fall precautions use walker/cane & assistance as needed      Do not drive when taking Pain medications.        Diet:diabetic cardiac ,  Fluid restriction 1.8 lit/day     Disposition HOME    If you experience worsening of your admission symptoms, develop shortness of breath, life threatening emergency, suicidal or homicidal thoughts you must seek medical attention immediately by calling 911 or calling your MD immediately  if symptoms less severe.    Do not take more than prescribed  Pain, Sleep and Anxiety Medications    Special Instructions: If you have smoked or chewed Tobacco  in the last 2 yrs please stop smoking, stop any regular Alcohol  and or any Recreational drug use.    You Must read complete instructions/literature along with all the possible adverse reactions/side effects for all the Medicines you take and that have been prescribed to you. Take any new Medicines after you have completely understood and accepted all the possible adverse reactions/side effects.

## 2016-07-07 NOTE — Plan of Care (Signed)
Problem: Safety  Goal: Patient will be free from injury during hospitalization  Outcome: Progressing   07/07/16 3500   Goal/Interventions addressed this shift   Patient will be free from injury during hospitalization  Assess patient's risk for falls and implement fall prevention plan of care per policy;Provide and maintain safe environment;Use appropriate transfer methods;Ensure appropriate safety devices are available at the bedside;Include patient/ family/ care giver in decisions related to safety;Hourly rounding;Assess for patients risk for elopement and implement Elopement Risk Plan per policy   Fall precaution in place. Bed alarm on. Call bell and personal items within reach.   Pt is a moderate falls risk. She is able to ambulate to restroom with stand by/guard assist.

## 2016-07-07 NOTE — Progress Notes (Signed)
[facility].  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 amLODIPine  10 mg Oral Daily   . aspirin EC  325 mg Oral Daily   . carvedilol  25 mg Oral BID Meals   . cloNIDine  0.2 mg Oral BID   . hydrALAZINE  100 mg Oral TID   . polyethylene glycol  17 g Oral Daily   . senna-docusate  2 tablet Oral BID   . simvastatin  10 mg Oral QHS     Continuous Infusions:   PRN Meds:acetaminophen, alum & mag hydroxide-simethicone, Nursing communication: Adult Hypoglycemia Treatment Algorithm **AND** dextrose **AND** dextrose **AND** glucagon (rDNA), insulin lispro, insulin lispro, naloxone, ondansetron **OR** ondansetron, traMADol    Objective:  Vital signs in last 24 hours:  Temp:  [97 F (36.1 C)-98.2 F (36.8 C)] 97.7 F (36.5 C)  Heart Rate:  [56-81] 63  Resp Rate:  [14-20] 14  BP: (116-162)/(57-73) 131/64  Intake/Output last 24 hours:    Intake/Output Summary (Last 24 hours) at 07/07/16 1057  Last data filed at 07/07/16 1000   Gross per 24 hour   Intake              900 ml   Output             1100 ml   Net             -200 ml     Intake/Output this shift:  I/O this shift:  In: 610 [P.O.:610]  Out: 300 [Urine:300]    Physical Exam:   Gen:  NAD   CV: S1 S2 N RRR   Chest: Crackles    Ab: ND NT    Ext: 2+ edema     Labs:    Recent Labs  Lab 07/07/16  0517 07/06/16  2054 07/06/16  1652 07/06/16  1115 07/04/16  1724   Glucose 115* 126* 125* 150* 102*   BUN 25.3* 25.2*  25.3* 26.9* 24.0*   Creatinine 1.9* 2.0* 2.1* 2.2* 1.9*   Calcium 7.8* 7.9* 8.0* 7.9* 8.1*   Sodium 127* 126* 127* 124* 123*   Potassium 4.6 4.8 4.9 4.8 4.9   Chloride 100 99* 98* 96* 92*   CO2 21* 22 22 23 25    Albumin  --   --   --   --  2.4*   Phosphorus  --   --   --  3.5  --    Magnesium  --   --   --  1.8  --        Recent Labs  Lab 07/07/16  0517 07/06/16  1115 07/04/16  1724   WBC 5.12 5.04 4.59   Hgb 7.4* 7.6* 8.5*   Hematocrit 22.9* 23.2* 26.1*   MCV 71.8* 71.2* 72.1*   MCH 23.2* 23.3* 23.5*   MCHC  32.3 32.8 32.6   RDW 19* 19* 18*   MPV 10.0 9.7 11.9   Platelets [phone]

## 2016-07-07 NOTE — Progress Notes (Signed)
Home Health Referral          Referral from [facility] (Case Manager) for home health care upon discharge.    By [employer], the patient has the right to freely choose a home care provider.  Arrangements have been made with:     A company of the patients choosing. We have supplied the patient with a listing of providers in your area who asked to be included and participate in Medicare.   [facility], a home care agency that provides both adult home care services which is a wholly owned and operated by [employer] and participates in [employer]   The preferred provider of your insurance company. Choosing a home care provider other than your insurance company's preferred provider may affect your insurance coverage.    The Home Health Care Referral Form acknowledging the voluntary selection of the home care company has been completed, signed, and is on file.      Home Health Discharge Information     Your doctor has ordered Skilled Nursing, Physical Therapy and Occupational Therapy in-home service(s) for you while you recuperate at home, to assist you in the transition from hospital to home.      The agency that you or your representative chose to provide the service:  Name of [place]: [place]. ([phone])]    Patient is currently active with above home health agency.  Verified and resumption of care orders sent.    The above services were set up by:  Theador Hawthorne  ([place])   Phone      [phone]                                       Additional comments:   If you have not heard from your home health agency within 24-48 hours after discharge please call your agency to arrange a time for your first visit.  For any scheduling concerns or questions related to home health, such as time or date please contact your home health agency at the number listed above.     Signed by: Theador Hawthorne RN, BSN  Date Time: 07/07/16 10:48 AM

## 2016-07-08 ENCOUNTER — Inpatient Hospital Stay: Payer: Medicare Other

## 2016-07-08 ENCOUNTER — Other Ambulatory Visit: Payer: Self-pay

## 2016-07-08 LAB — BASIC METABOLIC PANEL
Anion Gap: 5 (ref 5.0–15.0)
BUN: 25.1 mg/dL — ABNORMAL HIGH (ref 7.0–19.0)
CO2: 22 mEq/L (ref 22–29)
Calcium: 8 mg/dL — ABNORMAL LOW (ref 8.5–10.5)
Chloride: 99 mEq/L — ABNORMAL LOW (ref 100–111)
Creatinine: 2 mg/dL — ABNORMAL HIGH (ref 0.6–1.0)
Glucose: 113 mg/dL — ABNORMAL HIGH (ref 70–100)
Potassium: 4.6 mEq/L (ref 3.5–5.1)
Sodium: 126 mEq/L — ABNORMAL LOW (ref 136–145)

## 2016-07-08 LAB — HEMOGLOBIN AND HEMATOCRIT, BLOOD
Hematocrit: 22.3 % — ABNORMAL LOW (ref 37.0–47.0)
Hgb: 7.2 g/dL — ABNORMAL LOW (ref 12.0–16.0)

## 2016-07-08 LAB — GLUCOSE WHOLE BLOOD - POCT
Whole Blood Glucose POCT: 119 mg/dL — ABNORMAL HIGH (ref 70–100)
Whole Blood Glucose POCT: 135 mg/dL — ABNORMAL HIGH (ref 70–100)

## 2016-07-08 LAB — GFR: EGFR: 25.1

## 2016-07-08 MED ORDER — POLYETHYLENE GLYCOL 3350 17 G PO PACK
17.0000 g | PACK | Freq: Every day | ORAL | 0 refills | Status: DC
Start: 2016-07-08 — End: 2016-09-12

## 2016-07-08 MED ORDER — FAMOTIDINE 20 MG PO TABS
20.0000 mg | ORAL_TABLET | Freq: Every evening | ORAL | 0 refills | Status: DC
Start: 2016-07-08 — End: 2016-07-08

## 2016-07-08 MED ORDER — ONDANSETRON 4 MG PO TBDP
4.0000 mg | ORAL_TABLET | Freq: Four times a day (QID) | ORAL | 0 refills | Status: DC | PRN
Start: 2016-07-08 — End: 2016-08-10

## 2016-07-08 MED ORDER — PANTOPRAZOLE SODIUM 40 MG PO TBEC
40.0000 mg | DELAYED_RELEASE_TABLET | Freq: Every morning | ORAL | 0 refills | Status: DC
Start: 2016-07-09 — End: 2016-08-10

## 2016-07-08 MED ORDER — BUMETANIDE 1 MG PO TABS
0.5000 mg | ORAL_TABLET | Freq: Every morning | ORAL | 0 refills | Status: DC
Start: 2016-07-08 — End: 2016-07-13
  Filled 2016-07-08: qty 30, 60d supply, fill #0

## 2016-07-08 MED ORDER — FERROUS SULFATE 324 (65 FE) MG PO TBEC
324.0000 mg | DELAYED_RELEASE_TABLET | Freq: Every morning | ORAL | 0 refills | Status: DC
Start: 2016-07-08 — End: 2016-08-10

## 2016-07-08 NOTE — Plan of Care (Signed)
Problem: Safety  Goal: Patient will be free from injury during hospitalization   07/07/16 1032   Goal/Interventions addressed this shift   Patient will be free from injury during hospitalization  Assess patient's risk for falls and implement fall prevention plan of care per policy;Provide and maintain safe environment;Use appropriate transfer methods;Ensure appropriate safety devices are available at the bedside;Include patient/ family/ care giver in decisions related to safety;Hourly rounding;Assess for patients risk for elopement and implement [place] per policy;Provide alternative method of communication if needed (communication boards, writing)

## 2016-07-08 NOTE — Discharge Summary (Signed)
[facility] Hospitalist Discharge Note    Note Date: 07/08/2016  11:37 AM  Patient Name:Jacqueline Weber  OYD:[id number]  PCP: John Giovanni, MD  Admit Date:07/06/2016  Attending Physician:Caryle Helgeson, Pricilla Larsson, MD  Hospital Course:   Please see H&P for complete details of HPI and ROS. The patient was admitted to [facility] and has been diagnosed with the following conditions and has been taken care as mentioned below.    Patient Active Problem List    Diagnosis Date Noted   . Chronic renal impairment, unspecified CKD stage    . Acute hyponatremia 07/06/2016   . NSTEMI (non-ST elevated myocardial infarction) 06/08/2016   . Elevated brain natriuretic peptide (BNP) level    . Hyponatremia    . Renal insufficiency    . History of CVA (cerebrovascular accident)    . Hyperlipidemia 03/11/2013   . Type 2 diabetes mellitus without complication 76/72/0947   . HTN (hypertension) urgency 03/09/2013       Admission HPI  This 65 year old female with a past history seen above, comes to the ER, sent by her cardiologist for hyponatremia.  The patient recently was sent to [facility] for a coronary artery bypass graft in the beginning of October. H and P obtained from patient at the bedside per patient request as her main language is [place]. Since coming home from [place], she has had some intermittent dizziness mostly on exertion.  Daughter also states she has had an intermittent nonproductive cough that is random at times as been feeling "colder" than she normally does for the last few days.  No overt fevers, shortness of breath or any other complaints. She has not been drinking her normal amount of water, but is still taking her Bumex.  She is still having trouble with intermittent constipation.  Patient is sleeping comfortably in the stretcher.  He denies any chest pain, shortness breath, palpitations, abdominal pain, nausea, vomiting, diarrhea, melena, hematuria, focal weakness, numbness, syncope, fever, leg edema or any other  complaints.     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  Nephrology office next week  Chronic kidney disease stage IV (baseline creatinine 1.9-2.6 )  Currently appears at baseline  Left lower extremity pain and edema: Appears chronic   repeat Doppler negative for deep vein thrombosis  Left lower lobe atelectasis: No evidence of pneumonia, procalcitonin negative  Continue incentive spirometer  History of coronary artery disease and recent coronary artery bypass graft on June 12, 2016   Continue aspirin, beta blocker and statin  Uncontrolled hypertension: Improved.  Continue Norvasc, Coreg, Catapres and hydralazine  History of CVA in 2014 with mild left lower extremity weakness :  At baseline.  Continue aspirin, statin  Anemia of chronic disease:  Hemoglobin 7.2  His only slightly dropped from yesterday of 7.6; noted progressive decline as noted below   Ref. Range 06/20/2016 01:59 07/04/2016 17:24 07/06/2016 11:15 07/07/2016 05:17 07/08/2016 05:57   Hemoglobin Latest Ref Range: 12.0 - 16.0 g/dL 10.8 (L) 8.5 (L) 7.6 (L) 7.4 (L) 7.2 (L)     No evidence of active ongoing bleeding  Stool occult blood pending.  Discussed with daughter; patient  be started on Protonix, iron tablets and referred to gastroenterology and an outpatient to get outpatient colonoscopy and endoscopy  No transfusion was given to the patient during this hospital stay  Hyperlipidemia: Continue Zocor  Non Insulin-dependent diabetes mellitus: Hemoglobin A1c 7.9  Resume home medication  Disposition:  home health and home PT arranged ; agents nor to provide 24 /7 supervision to the  patient at home    Type of Admission: Inpatient  Medical Necessity for stay: Generalized weakness, hyponatremia  Central Line/Foley Catheter/PICC line status: none  Date of Admission:   07/06/2016  Date of Discharge:   07/08/2016     Chief Complaint:      Chief Complaint   Patient presents with   . Abnormal Lab       Physical Exam:   Vitals reviewed:   height is 1.524 m (5') and weight is 69.9 kg (154 lb 1.3 oz). Her temperature is 97 F (36.1 C). Her blood pressure is 132/64 and her pulse is 67. Her respiration is 18 and oxygen saturation is 96%.   Body mass index is 30.09 kg/m.  Vitals:    07/07/16 2038 07/08/16 0056 07/08/16 0609 07/08/16 0956   BP: 132/79 143/81 132/79 132/64   Pulse: 71 75 73 67   Resp: 17 15 18 18    Temp: 97 F (36.1 C) 98 F (36.7 C) 97.8 F (36.6 C) 97 F (36.1 C)   TempSrc:       SpO2: 97% 98% 99% 96%   Weight:   69.9 kg (154 lb 1.3 oz)    Height:         Intake and Output Summary (Last 24 hours) at Date Time    Intake/Output Summary (Last 24 hours) at 07/08/16 1137  Last data filed at 07/08/16 0956   Gross per 24 hour   Intake              580 ml   Output             1750 ml   Net            -1170 ml       Exam  Awake Alert, Oriented *3, No new F.N deficits, Normal affect  NC.AT,PERRAL  Supple Neck,No JVD, No cervical lymphadenopathy appriciated.   Symmetrical Chest wall movement, Good air movement bilaterally, Mild bibasilar crackles  RRR,No Gallops,Rubs or new Murmurs, No Parasternal Heave  +ve B.Sounds, Abd Soft, Non tender, No organomegaly appriciated, No rebound -guarding or rigidity.  Trace bilateral lower extremity edema, left greater than right, No new Rash or bruise      Discharge Diagnosis:   Hospital Problems:  Active Problems:    Type 2 diabetes mellitus without complication    HTN (hypertension) urgency    History of CVA (cerebrovascular accident)    Hyponatremia    Acute hyponatremia    Chronic renal impairment, unspecified CKD stage    Lists the present on admission  hospital problems  Present on Admission:  . Acute hyponatremia  . Hyponatremia  . HTN (hypertension) urgency  . Type 2 diabetes mellitus without complication    Consult Input/Plan   [place] FACE-TO-FACE (FTF) ENCOUNTER  Procedures performed:   No orders of the defined types were placed in this encounter.    Physical Exam:    height is 1.524 m (5') and weight is 69.9 kg (154 lb 1.3 oz). Her temperature is 97 F (36.1 C). Her blood pressure  is 132/64 and her pulse is 67. Her respiration is 18 and oxygen saturation is 96%.   Body mass index is 30.09 kg/m.  Vitals:    07/07/16 2038 07/08/16 0056 07/08/16 0609 07/08/16 0956   BP: 132/79 143/81 132/79 132/64   Pulse: 71 75 73 67   Resp: 17 15 18 18    Temp: 97 F (36.1 C) 98 F (36.7 C) 97.8 F (36.6 C) 97 F (36.1 C)   TempSrc:       SpO2: 97% 98% 99% 96%   Weight:   69.9 kg (154 lb 1.3 oz)    Height:         Intake and Output Summary (Last 24 hours) at Date Time    Intake/Output Summary (Last 24 hours) at 07/08/16 1137  Last data filed at 07/08/16 0956   Gross per 24 hour   Intake              580 ml   Output             1750 ml   Net            -1170 ml         Labs:   I have reviewed the labs  Results     Procedure Component Value Units Date/Time    Glucose Whole Blood - POCT [[id number]]  (Abnormal) Collected:  07/08/16 1058     Updated:  07/08/16 1100     POCT - Glucose Whole blood 135 (H) mg/dL     Basic Metabolic Panel [[id number]]  (Abnormal) Collected:  07/08/16 0557    Specimen:  Blood Updated:  07/08/16 0658     Glucose 113 (H) mg/dL      BUN 25.1 (H) mg/dL      Creatinine 2.0 (H) mg/dL      Calcium 8.0 (L) mg/dL      Sodium 126 (L) mEq/L      Potassium 4.6 mEq/L      Chloride 99 (L) mEq/L      CO2 22 mEq/L      Anion Gap 5.0    GFR [[id number]] Collected:  07/08/16 0557     Updated:  07/08/16 0658     EGFR 25.1    Glucose Whole Blood - POCT [[id number]]  (Abnormal) Collected:  07/08/16 0645     Updated:  07/08/16 0647     POCT - Glucose Whole  blood 119 (H) mg/dL     Hemoglobin and hematocrit, blood [[id number]]  (Abnormal) Collected:  07/08/16 0557    Specimen:  Blood Updated:  07/08/16 0631     Hgb 7.2 (L) g/dL      Hematocrit 22.3 (L) %     Glucose Whole Blood - POCT [[id number]]  (Abnormal) Collected:  07/07/16 2036     Updated:  07/07/16 2039     POCT - Glucose Whole blood 148 (H) mg/dL     IRON PROFILE [[id number]]  (Abnormal) Collected:  07/07/16 0517     Updated:  07/07/16 1658     Iron 29 (L) ug/dL      UIBC 107 (L) ug/dL      TIBC 136 (L) ug/dL      Iron Saturation 21 %     Hemolysis index [[id number]] Collected:  07/07/16 0517     Updated:  07/07/16 1658     Hemolysis Index 2    Glucose Whole Blood - POCT [[id number]]  (Abnormal) Collected:  07/07/16 1531  Updated:  07/07/16 1535     POCT - Glucose Whole blood 144 (H) mg/dL     Procalcitonin [[id number]] Collected:  07/06/16 1115     Updated:  07/07/16 1355     Procalcitonin <0.1        Rads:   I have reviewed the Radiology reports.  Echocardiogram Adult Complete W Clr/ Dopp Waveform    Result Date: 06/21/2016  [facility] , Transthoracic Echocardiogram 2D, M-mode, Doppler, and Color Doppler Study date:  21-Jun-2016 Patient: MADLYN CROSBY MR #: [id number] Account #: [id number] DOB: [birth year] Age: 63 years Gender: Female Height: 60 in Weight: 154.7 lb BSA: 1.67 m BP: 154/ 70 Allergies: MOSQUITO (CULEX PIPIENS) ALLERGY SKIN TEST, LISINOPRIL, SHELLFISH-DERIVED PRODUCTS, PENICILLINS Sonographer:  Kathryne Sharper, RDCS, RVT Cardiologist:  Coy Saunas, MD CLINICAL QUESTION: sob, eval for pericardial effusion, valves HISTORY: PRIOR HISTORY: CABG x4 06/12/16, CKD, CVA, HLD, HTN PROCEDURE: The procedure was performed at the bedside. This was a routine study. The transthoracic approach was used. The study included complete 2D imaging, M-mode, complete spectral Doppler, and color Doppler. Image quality was adequate. SYSTEM MEASUREMENT TABLES 2D mode AoR Diam (2D): 28 mm Asc Aorta Diam (2D): 30 mm LA  Dimension (2D): 39 mm LA ESV: 43100 mm3 LA/Ao (2D): 1.4 LASV: 25.8 ml/m2 EF Teichholz (2D mode): 73.6 % FS (2D-Teich): 42.3 % IVSd (2D): 11.1 mm IVSd (2D): 11.1 mm LVEF Biplane (2D): 77.7 % LVIDd (2D): 41.4 mm LVIDs (2D): 23.9 mm LVOT diam (2D mode): 346 mm LVPWd (2D): 10.7 mm Apical four chamber LA ESV Index (A4C): 26 ml/m2 LV Area Systolic (P5T): 614 mm LV ESV (A4C): 10300 mm3 LV Length Systolic (E3X): 54.0 mm RASV: 24.3 ml/m2 Apical two chamber LA ESV Index (A2C): 24.7 ml/m2 M mode AV Cusp Sep(MM): 21 mm TAPSE: 13.9 mm Unspecified Scan Mode AVA (VTI): 271 mm AVA (Vmax): 236 mm HR; Recent value: 64 /min Mean Gradient: 8 mm[Hg] Mean PG: 85 mm[Hg] Mean Velocity: 1340 mm/s Regurgitation PHT: 426 ms Regurgitation Vmax: 4620 mm/s Vmax: 2060 mm/s CO (LVOT): 086761 mm3/s LVOT Diameter: 21 mm LVOT Peak Gradient: 7 mm[Hg] LVOT Peak Velocity: 1350 mm/s LVOT VTI: 310 mm Deceleration Time: 201 ms MV Peak A Vel: 1030 mm/s Mean PG: 79 mm[Hg] Regurgitation Vmax: 4500 mm/s Acceleration Time: 102 ms Vmax: 1170 mm/s RVSP: 45 mm[Hg] Regurgitation Vmax: 2820 mm/s LEFT VENTRICLE: Size was normal. Systolic function was hyperdynamic. Ejection fraction was estimated to be 75 %. There were no regional wall motion abnormalities. Wall thickness was at the upper limits of normal. Doppler: The ratio of early ventricular filling to atrial contraction velocities was within the normal range. Features were consistent with a pseudonormal left ventricular filling pattern, with concomitant abnormal relaxation and increased filling pressure (grade 2 diastolic dysfunction). Doppler parameters were consistent with high ventricular filling pressure. AORTIC VALVE: The valve was trileaflet. Leaflets exhibited sclerosis. Doppler: There was no stenosis. There was mild to moderate regurgitation. AORTA: The root exhibited normal size. MITRAL VALVE: Valve structure was normal. There was normal leaflet separation. Doppler: The transmitral velocity  was within the normal range. There was no evidence for stenosis. There was mild regurgitation. LEFT ATRIUM: The atrium was mildly dilated. ATRIAL SEPTUM: No defect or patent foramen ovale was identified. RIGHT VENTRICLE: The size was normal. Systolic function was low normal. Wall thickness was normal. Doppler: Systolic pressure was mildly to moderately increased. Estimated peak pressure was 45 mmHg. PULMONIC VALVE: Doppler: There was no regurgitation. TRICUSPID VALVE: The valve structure was normal.  There was normal leaflet separation. Doppler: The transtricuspid velocity was within the normal range. There was no evidence for tricuspid stenosis. There was mild to moderate regurgitation. RIGHT ATRIUM: Size was at the upper limits of normal. SYSTEMIC VEINS: IVC: The inferior vena cava was dilated. Respirophasic changes were normal. PERICARDIUM: There was no pericardial effusion. There was a moderate-sized left pleural effusion. SUMMARY: -  Left ventricle: -  Size was normal. -  Systolic function was hyperdynamic. Ejection fraction was estimated to be 75 %. -  There were no regional wall motion abnormalities. -  Wall thickness was at the upper limits of normal. -  The ratio of early ventricular filling to atrial contraction velocities was within the normal range. -  Features were consistent with a pseudonormal left ventricular filling pattern, with concomitant abnormal relaxation and increased filling pressure (grade 2 diastolic dysfunction). -  Doppler parameters were consistent with high ventricular filling pressure. -  Right ventricle: -  The size was normal. -  Systolic function was low normal. -  Wall thickness was normal. -  Systolic pressure was mildly to moderately increased. Estimated peak pressure was 45 mmHg. -  Pericardium: -  There was no pericardial effusion. -  There was a moderate-sized left pleural effusion. COMPARISONS: The previous study was not available for direct comparison, however, there has  been no significant change from the report of that study. Comparison was made with the previous study of 31-May-2016. Prepared and signed by Coy Saunas, MD Signed 21-Jun-2016 09:45:16     Xr Chest 2 Views    Result Date: 07/08/2016  History: Follow-up pneumonia. Comment: Frontal and lateral views the chest were obtained and compared to 07/06/2016. Basal opacities decreased. Small pleural effusions. Cardiac mediastinal silhouette stable. Median sternotomy wires again noted.     Basal opacities decreased. Small pleural effusions. Donnetta Hail, MD 07/08/2016 9:07 AM     Xr Chest 2 Views    Result Date: 07/06/2016  Chest x-ray HISTORY: Status post CABG. Chills. COMPARISON: 07/05/2016, 06/20/2016 Frontal and lateral views. Small left pleural effusion. Increased airspace consolidation of the left lung base, suspicious for pneumonia. No pneumothorax seen. Enlarged cardiac mediastinal contour, may be cardiomegaly or pericardial effusion. Postoperative changes of the mediastinum.      Small left pleural effusion. Left lung base consolidation is suspicious for pneumonia. Clinical correlation and follow-up until resolution. Jonnie Kind, MD 07/06/2016 12:41 PM     X-ray Chest Pa And Lateral    Result Date: 07/05/2016  CLINICAL HISTORY: Cough. Comparison: 06/20/2016. Status post midline sternotomy. Heart slightly enlarged. Mediastinum stable. Pulmonary vascularity within normal limits. Increased markings left lower lobe consistent with infiltrate/atelectasis, slightly increased. Right lung is clear. Blunting left costophrenic angle, slightly increased. Right lung is clear. Minimal blunting right posterior costophrenic angle. Mild degenerative changes and mild dextrocurvature thoracic spine.     1. Cardiomegaly. 2. Left basilar airspace disease, slightly increased. 3. Small to moderate left pleural fluid/thickening, increased. Blima Singer, MD 07/05/2016 12:28 PM     Chest 2 Views    Result Date: 06/20/2016  CLINICAL  HISTORY: Chest pain COMPARISON: 06/14/2016 FINDINGS: PA and lateral views of the chest were exposed. The heart is enlarged and there has been a prior median sternotomy. There are small bilateral pleural effusions and bibasilar atelectasis. There is no obvious congestive heart failure. No pneumothorax is seen. Mediastinal structures and bones appear stable..     Bibasilar atelectasis and bilateral pleural effusions left greater than right. No obvious  congestive heart failure. Germain Osgood, MD 06/20/2016 7:50 AM     Xr Chest 2 Views    Result Date: 06/15/2016  CLINICAL HISTORY: Status post CABG. FINDINGS: Comparison 06/14/2016. PA and lateral views of the chest were performed. There is a median sternotomy. The right IJ sheath has been removed. The cardiomediastinal silhouette is enlarged, although stable. There are small bilateral pleural effusions. There are bibasilar densities consistent with atelectasis. The pulmonary vascular pattern is normal. There is no pneumothorax.     1. Cardiomegaly. 2. Small bilateral effusions. 3. Bibasilar atelectasis. Ruby Cola, MD 06/15/2016 3:38 PM     Nm Pulmonary Vent/perf (vq Scan)    Result Date: 06/20/2016  Short of breath recent cardiac bypass 06/12/2016 Findings compare ventilation/perfusion scan 05/31/2016 low probability A ventilation scan was performed with 36 mCi Tc 49m DTPA (diethylenetriaminepentaacetic acid) aerosol and a perfusion scan was performed with 6 mCi Tc 49m MAA (macroaggregated albumin) injected intravenously.  Multiple matched ventilation/perfusion images of the lungs were obtained in different projections.  Moderate central airway deposition. Heterogeneous ventilation/perfusion. A small matched ventilation/perfusion defects lung bases related to small pleural effusions and atelectasis related to recent surgery. There is better perfusion than ventilation in general.. No unmatched segmental perfusion defects. Nonsegmental defect related to the  prominent cardiac silhouette left lung base     . Low probability pulmonary embolism Nolene Bernheim, MD 06/20/2016 8:32 AM     X-ray Chest Ap Portable (daily)    Result Date: 06/14/2016  CLINICAL HISTORY: Status post cardiac surgery COMPARISON: 06/13/2016 FINDINGS: Single AP portable view of the chest was exposed. The heart is enlarged and there has been a prior median sternotomy. Since the prior examination the chest tubes and Swan-Ganz catheter have been removed. The aorta is dilated and unfolded consistent with atherosclerotic change. There is bibasilar atelectasis and/or patchy consolidation and small bilateral pleural effusions. No pneumothorax is identified. The tip of the right central venous line is in the SVC.      Bibasilar atelectasis and/or patchy consolidation and small bilateral pleural effusions. No pneumothorax identified. Germain Osgood, MD 06/14/2016 7:43 AM     X-ray Chest Ap Portable (daily)    Result Date: 06/13/2016  HISTORY: Status post CABG. COMPARISON: Chest radiograph 06/12/2016. Additional examinations dating to 05/31/2016. FINDINGS: Single portable view of the chest was obtained. A right internal jugular pulmonary arterial catheter terminates in the region of the main pulmonary artery. Left chest tube and mediastinal drains are in place. Following extubation, basilar atelectasis has increased and lung volumes have decreased. An enteric tube has also been removed. There is no pneumothorax or large pleural effusion. Cardiac size and mediastinal contours are accentuated by low lung volumes. No acute osseous findings are noted.      1. Increasing basilar atelectasis and decreased lung volumes following extubation. 2. Negative for pneumothorax. Ferd Hibbs, MD 06/13/2016 7:55 AM     X-ray Chest Ap Portable (once)    Result Date: 06/12/2016  HISTORY: Post op heart surgery. COMPARISON: 06/09/2016. FINDINGS: Portable chest x-ray was obtained. ET tube is present with tip approximately 2 cm  above the carina. New sternotomy wires are present. There is a right IJ sheath was Swan-Ganz catheter. Swan-Ganz catheter tip is in the main pulmonary artery. There are low lung volumes with minimal atelectasis. Mediastinal drains are present.      Low lung volumes with minimal atelectasis in intubated patient status post sternotomy. Bonna Gains, MD 06/12/2016 12:28 PM  Xr Chest Ap Portable    Result Date: 06/09/2016  Clinical history: Coronary artery disease. Renal failure. COMPARISON: 05/31/2016. Chest, AP portable: The osseous and soft tissue structures are unremarkable. The cardiac, mediastinal, and hilar silhouettes are within normal limits. The lungs are grossly clear.      No acute process. Nicholes Rough, MD 06/09/2016 5:59 PM     US Venous Duplex Doppler Leg Left    Result Date: 07/07/2016  HISTORY:  Left leg pain FINDINGS: Duplex evaluation of the deep venous system of the left lower extremity was performed.   The common femoral vein, superficial femoral vein and popliteal vein were visualized and appear unremarkable.  These veins are compressible with no evidence of thrombus.  Doppler demonstrates patency with normal directional and phasic flow. Appropriate augmentation was observed.  The proximal deep femoral vein is visualized and appears clear.  The portions of the posterior tibial and peroneal veins that were seen compress normally with no thrombus noted.  There is subcutaneous edema along the left calf and popliteal region.      No evidence of deep venous thrombosis involving the left lower extremity. Rosiland Oz, MD 07/07/2016 2:09 PM     US Venous Duplex Doppler Leg Bilateral    Result Date: 06/20/2016  History: Swelling Findings compare 06/10/2016 Duplex  evaluation of the deep venous system of both lower extremities was performed.  On each side the common femoral vein,  femoral vein, and popliteal vein were visualized.  These veins are compressible with no evidence of thrombus.   Doppler demonstrates patency with normal directional and phasic flow.  Appropriate augmentation was observed. The proximal deep femoral vein appears clear.  The portions of the posterior tibial and peroneal veins that were seen compress normally with no thrombus noted. There is no indirect evidence of calf vein thrombosis.     No evidence of deep vein thrombosis involving either lower extremity. Nolene Bernheim, MD 06/20/2016 5:20 PM     [place] Groin Pseudoaneurysm Right W Dopp    Result Date: 06/08/2016  CLINICAL HISTORY:   Right groin painfollowing cardiac catheterization. The right groin was evaluated with high resolution gray scale imaging, color Doppler, and spectral waveform analysis.  No pseudoaneurysm is identified.  There is no evidence of an abnormal AV communication, either by direct visualization or by the identification of arterialized venous blood flow.  A possible small soft tissue hematoma is seen. Although the scope of the exam was limited, no significant arterial occlusive disease or venous obstruction was apparent in the external iliac, common femoral, proximal superficial femoral, or proximal profunda vessels.      No evidence of AV fistula or pseudoaneurysm, right groin. Pauline Aus, MD 06/08/2016 3:04 PM     US Venous Low Extrem Duplex Dopp Ltd Bilat    Result Date: 06/10/2016  EXAM: Bilateral lower extremity venous Duplex study of the superficial veins. CLINICAL HISTORY: Preoperative vein mapping TECHNIQUE: The superficial systems of both lower extremities were evaluated using high resolution gray scale imaging, color Doppler, and spectral waveform analysis. Superficial lower extremity veins were evaluated for flow and compressibility. FINDINGS: Right lower extremity:      Great saphenous: patent without thrombosis or occlusion.      Small saphenous:  patent without thrombosis or occlusion. Left lower extremity:      Great saphenous: patent without thrombosis or occlusion.      Small saphenous:   patent without thrombosis or occlusion. Vein measurements are as follows (  mm): RIGHT GREAT SAPHENOUS VEIN: Proximal thigh 4.5 Mid thigh      3 Distal thigh   2.8 Knee           2.6 Proximal calf  2.3 Mid calf       2.6 Distal calf    1.9 Ankle          1.7 RIGHT SMALL SAPHENOUS VEIN: Proximal calf  2.1 Mid calf       2.2 Distal calf    2 Ankle          2.3 LEFT GREAT SAPHENOUS VEIN: Proximal thigh 5.3 Mid thigh      3.8 Distal thigh   3.3 Knee           3.1 Proximal calf  2.9 Mid calf       2.7 Distal calf    2.3 Ankle          2.8 LEFT SMALL SAPHENOUS VEIN: Proximal calf  2.2 Mid calf       2.2 Distal calf    1.8 Ankle          1.8     1. Widely patent bilateral great and small saphenous veins, no evidence of superficial thrombosis. Mild diffuse wall thickening of the small saphenous veins bilaterally. 2. Vein measurements as above. Hurley Cisco, MD 06/10/2016 11:38 AM     Spirometry Report Scan    Result Date: 06/21/2016  Ordered by an unspecified provider.    Discharge Medications:        Discharge Medication List      Taking    acetaminophen 325 MG tablet  Dose:  650 mg  Commonly known as:  TYLENOL  Take 2 tablets (650 mg total) by mouth every 4 (four) hours as needed for Pain or Fever.     amLODIPine 10 MG tablet  Dose:  10 mg  Commonly known as:  NORVASC  Take 1 tablet (10 mg total) by mouth daily.     aspirin EC 325 MG EC tablet  Dose:  325 mg  Take 1 tablet (325 mg total) by mouth daily.     bumetanide 1 MG tablet  Dose:  0.5 mg  What changed:  when to take this  Commonly known as:  BUMEX  Take 0.5 tablets (0.5 mg total) by mouth every morning.     carvedilol 25 MG tablet  Dose:  25 mg  Commonly known as:  COREG  Take 25 mg by mouth 2 (two) times daily with meals.     cloNIDine 0.2 MG tablet  Dose:  0.2 mg  Commonly known as:  CATAPRES  Take 1 tablet (0.2 mg total) by mouth 2 (two) times daily.     ferrous sulfate 324 (65 FE) MG Tbec  Dose:  324 mg  Take 1 tablet (324 mg total) by mouth every morning with  breakfast.     hydrALAZINE 100 MG tablet  Dose:  100 mg  Commonly known as:  APRESOLINE  Take 1 tablet (100 mg total) by mouth 3 (three) times daily.     ondansetron 4 MG disintegrating tablet  Dose:  4 mg  Commonly known as:  ZOFRAN-ODT  Take 1 tablet (4 mg total) by mouth every 6 (six) hours as needed for Nausea.     pantoprazole 40 MG tablet  Dose:  40 mg  Commonly known as:  PROTONIX  Start taking on:  07/09/2016  Take 1 tablet (40 mg total) by mouth every morning  before breakfast.     polyethylene glycol packet  Dose:  17 g  Commonly known as:  MIRALAX  Take 17 g by mouth daily.     senna-docusate 8.6-50 MG per tablet  Dose:  2 tablet  Commonly known as:  PERICOLACE  Take 2 tablets by mouth 2 (two) times daily.     simvastatin 10 MG tablet  Dose:  10 mg  Commonly known as:  ZOCOR  Take 10 mg by mouth nightly.     SITagliptin 25 MG tablet  Dose:  25 mg  Commonly known as:  JANUVIA  Take 1 tablet (25 mg total) by mouth daily.     traMADol 50 MG tablet  Dose:  50 mg  Commonly known as:  ULTRAM  Take 1 tablet (50 mg total) by mouth every 8 (eight) hours as needed (pain).     vitamin D (ergocalciferol) 50000 UNIT Caps  Dose:  2000 Units  Commonly known as:  DRISDOL  Take 2,000 Units by mouth 2 (two) times daily.        STOP taking these medications    ondansetron 4 MG tablet  Commonly known as:  ZOFRAN            Pending Labs:     Unresulted Labs     None         Discharge Destination:   home   Condition at Discharge :   stable   Labs/Images to be followed at your PCP office   Cbc cmp   Follow-up:     [facility] up in 2 day(s).    Why:  Home Health Visits to Resume  Contact information:  [place] [address]  [phone]           Queen Slough, MD Follow up in 1 week(s).    Specialties:  Nephrology, Internal Medicine  Contact information:  [address]  [place] [address]  [phone]             John Giovanni, MD Follow  up in 1 week(s).    Specialty:  Internal Medicine  Contact information:  [phone]  [place] [address]  [phone]             Blosser, Omer Jack, MD Follow up in 1 week(s).    Specialty:  Gastroenterology  Why:  anemia workup:colonoscopy/EGD  Contact information:  [address]  [address]  [place] [address]  [phone]                      Time spent for Discharge Care:   36 minutes      Signed by: Dirk Dress, MD

## 2016-07-08 NOTE — Plan of Care (Signed)
Ask3Teach3 Program    Education about New Medications and their Side effects    Dear Jacqueline Weber,    Its been a pleasure taking care of you during your hospitalization here at [facility]. We have initiated a new program to educate our patients and/or their family members or designated personnel about the new medications started by your physicians and their indications along with the possible side effects. Multiple studies have shown that patients started on new medications are often unaware of the names of the medication along with the indications and their side effects which leads to decreased compliance with the medications.    During our conversation today on 07/08/2016  11:46 AM I have explained to you the name of the new medication and the indication along with some possible common side effects. Listed below are some of the new medications started during this hospitalization.     Please call the Nurse if you have any side effects while in hospital.     Please call [phone] if you have any life threatening symptoms after you are discharged from the hospital.    Please inform your Primary care physician for common side effects which are not life threatening after discharge.    Medication Name: Ferrous Sulfate (Iron)   This Medication is used for:   Low iron level     Common Side Effects are:   Constipation   Nausea   Vomiting     A note from your nurse:  Call your nurse immediately if you notice itching, hives, swelling or trouble breathing     Medication: Ondansetron(Zofran)   This Medication is used for:   Nausea   Vomiting    Common Side Effects are:   Dizziness   Headache   Constipation   Fatigue    A note from your nurse:  Call your nurse immediately if you notice itching, hives, swelling or trouble breathing     Medication: Pantoprazole(Protonix)   This Medication is used for:   Reduce stomach acidity   Reflux disease(GERD)   Peptic ulcer disease    Common Side Effects  are:   Abdominal pain   Headache   Constipation   Flatulence    A note from your nurse:  Call your nurse immediately if you notice itching, hives, swelling or trouble breathing     Medication: Polyethyleneglycol(Miralax)   This Medication is used for:   Constipation    Common Side Effects are:   Abdominal pain   Cramps   Diarrhea   Nausea    A note from your nurse:  Call your nurse immediately if you notice itching, hives, swelling or trouble breathing         Thank you for your time.    Coralie Keens, RN  07/08/2016  11:46 AM  [facility]  [address]  [place], [place]  [address]

## 2016-07-08 NOTE — OT Eval Note (Signed)
[facility]  [place]  [phone]    Occupational Therapy Evaluation    Patient: Jacqueline Weber    MRN#: [id number]     [id number]/[id number]    Time of treatment: Time Calculation  OT Received On: 07/08/16  Start Time: 1110  Stop Time: 1135  Time Calculation (min): 25 min  OT Visit Number: 01    Consult received for Jacqueline Weber for OT Evaluation and Treatment.  Patient's medical condition is appropriate for Occupational therapy intervention at this time.    Assessment:   .Jacqueline Weber is a 65 y.o. female admitted 07/06/2016.   Expanded chart review completed including review of labs, review of vitals, review of H&P and physician progress notes and review of consulting physician notes .  Pt's ability to complete ADLs and functional transfers is impaired due to the following deficits:  decreased activity tolerance.  Pt demonstrates performance deficits with dressing. There are a few comorbidities or other factors that affect plan of care and require modification of task including: recent surgery, has stairs to manage and home alone for a portion of the day.  Pt would continue to benefit from OT to address these deficits and increase functional independence.    Assessment: decreased strength;decreased ROM;decreased independence with ADLs;decreased independence with IADLs;decreased endurance/activity tolerance     Complexity Chart Review Performance Deficits Clinical Decision Making Hx/Comorbidities Assistance needed   Low Brief 1-3 Limited options None None (or at baseline)   Moderate Expanded 3-5 Several Options 1-2 Min/Mod assist (not at baseline)   High Extensive 5 or more Multiple options 3 or more Max/dependent (not at baseline     Therapy Diagnosis: generalized weakness and decreased independence with ADL's due to HPI. Without therapy interventions, patient is at risk for dependence on caregivers for ADL's and decreased  independence.    Rehabilitation Potential: Prognosis: Good;With continued OT s/p acute discharge;With family      Plan:   OT Frequency Recommended: one time visit   Treatment Interventions: No further acute care OT skilled interventions needed at this time; continue with home health therapy         Risks/Benefits/POC Discussed with Pt/Family: With patient/family      Discharge Recommendations:   Based on today's session patient's discharge recommendation is the following: Discharge Recommendation: Home with home health OT;Home with supervision.     If Discharge Recommendation: Home with home health OT;Home with supervision is not available, then the patient will need home health services.           Precautions and Contraindications:  Falls  Precautions  Sternal Precautions: no raising elbows higher than shoulders;no lifting;no pushing;no pulling      Medical Diagnosis: Chills [R68.83]  Hyponatremia [E87.1]  Anemia, unspecified type [D64.9]  Chronic renal impairment, unspecified CKD stage [N18.9]    History of Present Illness: Jacqueline Weber is a 65 y.o. female admitted on 07/06/2016 with weakness    Patient Active Problem List   Diagnosis   . Type 2 diabetes mellitus without complication   . HTN (hypertension) urgency   . Hyperlipidemia   . History of CVA (cerebrovascular accident)   . Elevated brain natriuretic peptide (BNP) level   . Hyponatremia   . Renal insufficiency   . NSTEMI (non-ST elevated myocardial infarction)   . Acute hyponatremia   . Chronic renal impairment, unspecified CKD stage        Past Medical/Surgical History:  Past Medical  History:   Diagnosis Date   . Cataracts, bilateral    . Chronic kidney disease    . Diabetes mellitus    . History of CVA (cerebrovascular accident) 03/2013   . History of MI (myocardial infarction)    . 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 Hypercholesteremia    . Hypertension       Past Surgical History:   Procedure Laterality Date   . CORONARY ARTERY BYPASS N/A 06/12/2016     Procedure: Coronary Artery Bypass x 4.;  Surgeon: Bjorn Loser, MD;  Location: [facility] HEART OR;  Service: Cardiothoracic;  Laterality: N/A;  LIMA to LDA  Vein to PDA  Vein to OM  Vein to Diagonal   . ENDOSCOPIC,VEIN HARVEST N/A 06/12/2016    Procedure: Endoscopic, Left Leg Greater Saphenous Vein Harvest;  Surgeon: Bjorn Loser, MD;  Location: [facility] HEART OR;  Service: Cardiothoracic;  Laterality: N/A;  Left Leg Incision (medially, groin to ankle) @ 0742  Vein out of leg @ 0826  Vein prep complete @ 0845  Total time = 63 minutes         X-Rays/Tests/Labs:  Xr Chest 2 Views    Result Date: 07/08/2016  Basal opacities decreased. Small pleural effusions. Donnetta Hail, MD 07/08/2016 9:07 AM     Xr Chest 2 Views    Result Date: 07/06/2016   Small left pleural effusion. Left lung base consolidation is suspicious for pneumonia. Clinical correlation and follow-up until resolution. Jonnie Kind, MD 07/06/2016 12:41 PM     X-ray Chest Pa And Lateral    Result Date: 07/05/2016  1. Cardiomegaly. 2. Left basilar airspace disease, slightly increased. 3. Small to moderate left pleural fluid/thickening, increased. Blima Singer, MD 07/05/2016 12:28 PM     US Venous Duplex Doppler Leg Left    Result Date: 07/07/2016   No evidence of deep venous thrombosis involving the left lower extremity. Rosiland Oz, MD 07/07/2016 2:09 PM         Social History:  Prior Level of Function  Prior level of function:  (since surgery)  Assistive Device: Front wheel walker  Baseline Activity Level: Household ambulation  Dressing - Upper Body: minimal assist  Dressing - Lower Body: minimal assist  DME Currently at Home: Front wheel walker  Home Living Arrangements  Living Arrangements: Children (Daughter is with pt at all times)  Type of Home: House  Home Layout: Multi-level, Bed/bath upstairs  Bathroom Shower/Tub: Engineer, materials: Civil engineer, contracting  DME Currently at [employer]: [place] - Notes / Comments: Daughter  has been assisting her since her surgery; prior to surgery she did not require assistance, however did have low energy.       Subjective:   Patient is agreeable to participation in the therapy session. Nursing clears patient for therapy.      .        Objective:   Observation of Patient/Vital Signs:  Patient is in bed with telemetry in place.         Cognition/Neuro Status  Arousal/Alertness: Appropriate responses to stimuli  Attention Span: Appears intact  Orientation Level: Oriented X4  Memory: Appears intact  Following Commands: minimal verbal instruction  Safety Awareness: minimal verbal instruction  Insights: Fully aware of deficits  Problem Solving: minimal assistance  Behavior: calm;cooperative  Motor Planning: intact  Coordination: intact    Gross ROM  Right Upper Extremity ROM: within  functional limits (within sternal precautions)  Left Upper Extremity ROM: within functional limits (within sternal precautions)  Gross Strength  Right Upper Extremity Strength: within functional limits (not formally tested 2/2 sternal precautions;no focal deficit)  Left Upper Extremity Strength: within functional limits (not formally tested 2/2 sternal precautions;no focal deficit)          Sensory  Auditory: intact  Tactile - Light Touch: intact  Visual Acuity: intact       Self-care and Home Management  Eating: Independent  Grooming: Modified Independent (standing with RW)  Bathing: Minimal Assist;standing in shower (simulated)  LB Dressing: Minimal Assist;edge of bed;Don/doff R sock;Don/doff L sock  Toileting: Modified Independent  Functional Transfers: Modified Independent    Mobility and Transfers  Sit to Supine: Stand by Assist  Sit to Stand: Modified Independent  Bed to Toilet: Modified Independent  Functional Mobility/Ambulation: Modified Independent          Participation and Endurance  Participation Effort: good  Endurance: Tolerates 10 - 20 min exercise with multiple rests    AM-PACT "6 Clicks" Daily Activity  Inpatient Short Form  Inpatient AM-PACT Performed?: yes  Put On/Take Off Lower Body Clothing: A little  Assist with Bathing: A little  Assist with Toileting: None  Put On/Take Off Upper Body Clothing: A little  Assist with Grooming: None  Assist with Eating: None  OT Daily Activity Raw Score: 21  CMS 0-100% Score: 32.79%    Treatment Activities: Patient engaged in the therapeutic activities stated above and presents with deficits in functional mobility, transfers, and ADL performance. Patient was educated regarding home safety considerations when completing home mobility; patient and daughter verbalized understand. Patient was educated on energy conservation strategies focusing on pacing of activities throughout the day, rest breaks, and completing stairs only once per day. Pt and daughter were educated on maximizing independence by allowing patient to complete as much self-care as she can independently complete, and using equipment to maximize energy conservation.     Educated the patient to role of occupational therapy, plan of care, goals of therapy and safety with mobility and ADLs, energy conservation techniques, sternal precautions, home safety.    Daci Stubbe R Lilleigh Hechavarria, MS, OTR/L  Occupational Therapist  [facility]  Physical Medicine and Rehabilitation

## 2016-07-08 NOTE — Progress Notes (Signed)
[facility].  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 She may need a blood transfusion and stool for occult blood is pending. Her H/H a month ago was 10.8    Dayna Barker, MD  Office [phone]  ++++++++++++++++++++++++++++++++++++++++++++++++++++++++++++++  Subjective:  No new complaints    Medications:  Scheduled Meds:  Current Facility-Administered Medications   Medication Dose Route Frequency   . amLODIPine  10 mg Oral Daily   . aspirin EC  325 mg Oral Daily   . carvedilol  25 mg Oral BID Meals   . cloNIDine  0.2 mg Oral BID   . furosemide  40 mg Intravenous Daily   . hydrALAZINE  100 mg Oral TID   . pantoprazole  40 mg Oral QAM AC   . polyethylene glycol  17 g Oral Daily   . senna-docusate  2 tablet Oral BID   . simvastatin  10 mg Oral QHS   . SITagliptin  25 mg Oral Daily     Continuous Infusions:   PRN Meds:acetaminophen, alum & mag hydroxide-simethicone, Nursing communication: Adult Hypoglycemia Treatment Algorithm **AND** dextrose **AND** dextrose **AND** glucagon (rDNA), insulin lispro, insulin lispro, naloxone, ondansetron **OR** ondansetron, traMADol    Objective:  Vital signs in last 24 hours:  Temp:  [97 F (36.1 C)-98 F (36.7 C)] 97 F (36.1 C)  Heart Rate:  [63-84] 67  Resp Rate:  [15-18] 18  BP: (119-148)/(61-81) 132/64  Intake/Output last 24 hours:    Intake/Output Summary (Last 24 hours) at 07/08/16 1100  Last data filed at 07/08/16 0956   Gross per 24 hour   Intake              580 ml   Output             1750 ml   Net            -1170 ml      Intake/Output this shift:  I/O this shift:  In: 120 [P.O.:120]  Out: 400 [Urine:400]    Physical Exam:   Gen:  NAD   CV: S1 S2 N RRR   Chest: Crackles    Ab: ND NT    Ext: 2+ edema     Labs:    Recent Labs  Lab 07/08/16  0557 07/07/16  0517 07/06/16  2054  07/06/16  1115 07/04/16  1724   Glucose 113* 115* 126* More results in Results Review 150* 102*   BUN 25.1* 25.3* 25.2* More results in Results Review 26.9* 24.0*   Creatinine 2.0* 1.9* 2.0* More results in Results Review 2.2* 1.9*   Calcium 8.0* 7.8* 7.9* More results in Results Review 7.9* 8.1*   Sodium 126* 127* 126* More results in Results Review 124* 123*   Potassium 4.6 4.6 4.8 More results in Results Review 4.8 4.9   Chloride 99* 100 99* More results in Results Review 96* 92*  CO2 22 21* 22 More results in Results Review 23 25   Albumin  --   --   --   --   --  2.4*   Phosphorus  --   --   --   --  3.5  --    Magnesium  --   --   --   --  1.8  --    More results in Results Review = values in this interval not displayed.    Recent Labs  Lab 07/08/16  0557 07/07/16  0517 07/06/16  1115 07/04/16  1724   WBC  --  5.12 5.04 4.59   Hgb 7.2* 7.4* 7.6* 8.5*   Hematocrit 22.3* 22.9* 23.2* 26.1*   MCV  --  71.8* 71.2* 72.1*   MCH  --  23.2* 23.3* 23.5*   MCHC  --  32.3 32.8 32.6   RDW  --  19* 19* 18*   MPV  --  10.0 9.7 11.9   Platelets  --  [phone]

## 2016-07-10 ENCOUNTER — Encounter (INDEPENDENT_AMBULATORY_CARE_PROVIDER_SITE_OTHER): Payer: Medicare Other | Admitting: Internal Medicine

## 2016-07-10 ENCOUNTER — Encounter (INDEPENDENT_AMBULATORY_CARE_PROVIDER_SITE_OTHER): Payer: Self-pay

## 2016-07-10 ENCOUNTER — Telehealth (INDEPENDENT_AMBULATORY_CARE_PROVIDER_SITE_OTHER): Payer: Self-pay | Admitting: Internal Medicine

## 2016-07-10 ENCOUNTER — Telehealth: Payer: Self-pay

## 2016-07-10 NOTE — Telephone Encounter (Signed)
Transitional Care Management    Writer called pt at [phone] (home)  to initiate AMI management. Writer left voicemail requesting return call. Writer provided contact information. Writer to f/u in a few days.    Gae Dry, RN MSN  [place] [facility]  Case Manager  [phone]

## 2016-07-10 NOTE — Telephone Encounter (Signed)
Call from Home health  Pt is dizzy, unable to get out of bed  Advised home health that pt needs to go to ER if pt is unable to get up.  Has been mobile with a walker up until now.

## 2016-07-10 NOTE — Progress Notes (Addendum)
TCM Medicare Navigator    07/10/16 - TCM Medicare Navigator made outreach to Dr. John Giovanni @ [phone] and line is busy. Navigator noted line continued to ring busy *navigator unable to confirm if pt was seen today* due to busy line.    Navigator made outreach to [facility] @ [phone] and noted ROC is scheduled for today. CM reports she has not discussed pt status with the assigned nurse. Navigator noted CM will provide f/u call to discuss pt status.    Inbound call received from pt's CM [facility] @ [phone]. Navigator noted pt met with [place], pt BP 160/56 - pt feels dizzy, adheres to meds. Pt was advised to return to ED.      Amanda Pea, [place] Medicare Focus Navigator  [phone]

## 2016-07-12 ENCOUNTER — Encounter (INDEPENDENT_AMBULATORY_CARE_PROVIDER_SITE_OTHER): Payer: Self-pay

## 2016-07-12 NOTE — Progress Notes (Signed)
TCM Medicare Focus Diagnosis Navigator  Follow Up Call      Call participant, relationship to patient and contact information: [place] @ [phone]      Follow Up Appointments    Follow Up appointments currently scheduled: MD appt scheduled tomorrow per CM    Issues with attending follow up appointments: NONE        Needs    Follow up needs: No needs identified per CM    Barriers or concerns to continued wellness in the community: No barriers reported    Referrals placed: PT, CM is not certain if pt is continuing OT    Next follow up call scheduled: 07/14/16      Comments    07/12/16 - TCM Medicare Navigator made outreach to [facility] @ [phone] and spoke to [place]. Navigator documented pt refused SN visit today. CM spoke to pt's daughter and pt is doing better. Pt has upcoming appt MD tomorrow. Nurse will meet with pt on Friday, pt remains stable and has no urgent needs. Pt previously utilized OT.    Amanda Pea, [place] Medicare Focus Navigator  [phone]

## 2016-07-13 ENCOUNTER — Telehealth: Payer: Self-pay

## 2016-07-13 ENCOUNTER — Ambulatory Visit (INDEPENDENT_AMBULATORY_CARE_PROVIDER_SITE_OTHER): Payer: Medicare Other | Admitting: Internal Medicine

## 2016-07-13 ENCOUNTER — Encounter (INDEPENDENT_AMBULATORY_CARE_PROVIDER_SITE_OTHER): Payer: Self-pay | Admitting: Internal Medicine

## 2016-07-13 VITALS — BP 119/66 | HR 69 | Temp 97.4°F | Resp 16 | Wt 148.0 lb

## 2016-07-13 DIAGNOSIS — E871 Hypo-osmolality and hyponatremia: Secondary | ICD-10-CM

## 2016-07-13 DIAGNOSIS — I10 Essential (primary) hypertension: Secondary | ICD-10-CM

## 2016-07-13 DIAGNOSIS — E119 Type 2 diabetes mellitus without complications: Secondary | ICD-10-CM

## 2016-07-13 DIAGNOSIS — I25810 Atherosclerosis of coronary artery bypass graft(s) without angina pectoris: Secondary | ICD-10-CM

## 2016-07-13 DIAGNOSIS — D649 Anemia, unspecified: Secondary | ICD-10-CM

## 2016-07-13 MED ORDER — LANCET DEVICE MISC
3 refills | Status: DC
Start: 2016-07-13 — End: 2016-10-02

## 2016-07-13 MED ORDER — GLUCOSE BLOOD VI STRP
ORAL_STRIP | 3 refills | Status: DC
Start: 2016-07-13 — End: 2016-10-02

## 2016-07-13 NOTE — Progress Notes (Signed)
Subjective:      Date: 07/13/2016 2:56 PM   Patient ID: Jacqueline Weber is a 65 y.o. female.    Chief Complaint:  Chief Complaint   Patient presents with   . Anemia     hospital follow up       Here for follow-up from recent hospitalization.   Inpatient facility: [facility]  Date of admission: November 2  Date of discharge: November 4  Discharge diagnosis: low sodium  Hospital course:nomalized sodium  Prior hospitalization within past 30 days: Yes   Prior hospitalization within past 6 months: Yes  Comorbid conditions: hypertension  Medical procedure(s) performed: IV hydration  Surgical procedure(s) performed: none  Diagnostic tests performed: EKG, chest x-ray, ultrasound abdomen, echocardiogram, venous dopplers and V/Q scan  Pending tests: none  Home care: home PT assisting with care, skilled nursing assisting with care and family assisting with home health needs  Specialist follow-up: cardiology, GI and nephrology  Post-hospital monitoring: daily weights and ambulatory BP monitoring  Mobility status: normal mobility     Needs refill of test strips and lancets  Feels lightheaded after taking hydralazine.      Medication Reconciliation(required):  Hospital discharge medications reviewed and reconciled with current medication list.      Problem List:  Patient Active Problem List   Diagnosis   . Type 2 diabetes mellitus without complication   . HTN (hypertension) urgency   . Hyperlipidemia   . History of CVA (cerebrovascular accident)   . Elevated brain natriuretic peptide (BNP) level   . Hyponatremia   . Renal insufficiency   . NSTEMI (non-ST elevated myocardial infarction)   . Acute hyponatremia   . Chronic renal impairment, unspecified CKD stage       Current Medications:  Current Outpatient Prescriptions   Medication Sig Dispense Refill   . acetaminophen (TYLENOL) 325 MG tablet Take 2 tablets (650 mg total) by mouth every 4 (four) hours as needed for Pain or Fever.     [name] amLODIPine (NORVASC) 10 MG  tablet Take 1 tablet (10 mg total) by mouth daily.     [name] aspirin EC 325 MG EC tablet Take 1 tablet (325 mg total) by mouth daily. 30 tablet 0   . bumetanide (BUMEX) 0.5 MG tablet Take 0.5 mg by mouth daily.     . carvedilol (COREG) 25 MG tablet Take 25 mg by mouth 2 (two) times daily with meals.        . cloNIDine (CATAPRES) 0.2 MG tablet Take 1 tablet (0.2 mg total) by mouth 2 (two) times daily. 60 tablet 0   . ferrous sulfate 324 (65 FE) MG Tablet Delayed Response Take 1 tablet (324 mg total) by mouth every morning with breakfast. 30 tablet 0   . hydrALAZINE (APRESOLINE) 100 MG tablet Take 1 tablet (100 mg total) by mouth 3 (three) times daily. 60 tablet 0   . ondansetron (ZOFRAN-ODT) 4 MG disintegrating tablet Take 1 tablet (4 mg total) by mouth every 6 (six) hours as needed for Nausea. 20 tablet 0   . pantoprazole (PROTONIX) 40 MG tablet Take 1 tablet (40 mg total) by mouth every morning before breakfast. 30 tablet 0   . polyethylene glycol (MIRALAX) packet Take 17 g by mouth daily. 30 each 0   . senna-docusate (PERICOLACE) 8.6-50 MG per tablet Take 2 tablets by mouth 2 (two) times daily. 30 tablet 0   . simvastatin (ZOCOR) 10 MG tablet Take 10 mg by mouth nightly.     [name]  SITagliptin (JANUVIA) 25 MG tablet Take 1 tablet (25 mg total) by mouth daily. 30 tablet 3   . traMADol (ULTRAM) 50 MG tablet Take 1 tablet (50 mg total) by mouth every 8 (eight) hours as needed (pain). 30 tablet 0   . vitamin D, ergocalciferol, (DRISDOL) 50000 UNIT Cap Take 2,000 Units by mouth 2 (two) times daily.     [name] glucose blood test strip Check BS qam 100 each 3   . Lancet Device Misc Check BS qam 100 each 3     No current facility-administered medications for this visit.        Allergies:  Allergies   Allergen Reactions   . Mosquito (Culex Pipiens) Allergy Skin Test Shortness Of Breath     And  Trouble  breathing   . Lisinopril    . Shellfish-Derived Products Hives     New  allergy  New  allergy   . Penicillins Rash     Swelling,   numbness       Past Medical History:  Past Medical History:   Diagnosis Date   . Cataracts, bilateral    . Chronic kidney disease    . Diabetes mellitus    . History of CVA (cerebrovascular accident) 03/2013   . History of MI (myocardial infarction)    . 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 Hypercholesteremia    . Hypertension        Past Surgical History:  Past Surgical History:   Procedure Laterality Date   . CORONARY ARTERY BYPASS N/A 06/12/2016    Procedure: Coronary Artery Bypass x 4.;  Surgeon: Bjorn Loser, MD;  Location: [facility] HEART OR;  Service: Cardiothoracic;  Laterality: N/A;  LIMA to LDA  Vein to PDA  Vein to OM  Vein to Diagonal   . ENDOSCOPIC,VEIN HARVEST N/A 06/12/2016    Procedure: Endoscopic, Left Leg Greater Saphenous Vein Harvest;  Surgeon: Bjorn Loser, MD;  Location: [facility] HEART OR;  Service: Cardiothoracic;  Laterality: N/A;  Left Leg Incision (medially, groin to ankle) @ 0742  Vein out of leg @ 0826  Vein prep complete @ 0845  Total time = 63 minutes       Family History:  Family History   Problem Relation Age of Onset   . No known problems Mother    . No known problems Father        Social History:  Social History     Social History   . Marital status: Widowed     Spouse name: N/A   . Number of children: N/A   . Years of education: N/A     Occupational History   . Not on file.     Social History Main Topics   . Smoking status: Never Smoker   . Smokeless tobacco: Never Used   . Alcohol use No   . Drug use: No   . Sexual activity: Not on file     Other Topics Concern   . Not on file     Social History Narrative   . No narrative on file       The following sections were reviewed this encounter by the provider:   Tobacco  Allergies  Meds  Problems  Med Hx  Surg Hx  Fam Hx  Soc Hx            Vitals:  BP 119/66 (BP Site: Left arm, Patient Position: Sitting)   Pulse  69   Temp 97.4 F (36.3 C)   Resp 16   Wt 67.1 kg (148 lb)   SpO2 94%   BMI 28.90 kg/m       Review of Systems    Constitutional: Negative for chills and fever.   Respiratory: Negative for cough and shortness of breath.    Cardiovascular: Positive for chest pain. Negative for palpitations.        Chest pain at site of incision   Gastrointestinal: Negative for diarrhea, nausea and vomiting.        Has hemorrhoids   Genitourinary: Negative for dysuria and hematuria.   Neurological: Positive for dizziness and weakness. Negative for tingling, focal weakness and headaches.     General Examination:   GENERAL APPEARANCE: alert, in no acute distress, well developed, well nourished, oriented to time, place, and person.   HEAD: normal appearance, atraumatic.   EYES: extraocular movement intact (EOMI), pupils equal, round, reactive to light and accommodation, sclera anicteric, conjunctiva clear.   ORAL CAVITY: normal oropharynx, normal lips, mucosa moist, no lesions.   THROAT: normal appearance, clear, no erythema.   NECK/THYROID: neck supple,  no cervical lymphadenopathy, no neck mass palpated, no thyromegaly.   LYMPH NODES: no palpable adenopathy.   SKIN: good turgor, no rashes, no suspicious lesions.   HEART: S1, S2 normal, no murmurs, rubs, gallops, regular rate and rhythm.   LUNGS: normal effort / no distress, normal breath sounds, clear to auscultation bilaterally, no wheezes, rales, rhonchi.   ABDOMEN: bowel sounds present, soft, nontender, nondistended.   EXTREMITIES: 2+ edema  NEUROLOGIC: nonfocal, cranial nerves 2-12 grossly intact  PSYCH: cognitive function intact, mood/affect full range, speech clear.    1. Hyponatremia  Continue fluid restriction and daily weights  Keep appt with nephrology tomorrow    2. Type 2 diabetes mellitus treated without insulin  - Lancet Device Misc; Check BS qam  Dispense: 100 each; Refill: 3  - glucose blood test strip; Check BS qam  Dispense: 100 each; Refill: 3    3. Essential hypertension  Pt to hold 3rd hydralazine dose tonight.  Advised pt to take meds as prescribed tomorrow and advised  daughter to discuss low BP with nephrologist, may need change in medication regimen    4. 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    5. Anemia  Stressed importance of f/u with GI    Spent 25 minutes face to face time with patient and daughter, more than 50 % of time spent in coordinating care.  Reviewed hospitalization records.    John Giovanni, MD

## 2016-07-13 NOTE — Progress Notes (Deleted)
Subjective:      Date: 07/13/2016 2:12 PM   Patient ID: Jacqueline Weber is a 65 y.o. female.    Chief Complaint:  No chief complaint on file.      HPI:  HPI    Problem List:  Patient Active Problem List   Diagnosis   . Type 2 diabetes mellitus without complication   . HTN (hypertension) urgency   . Hyperlipidemia   . History of CVA (cerebrovascular accident)   . Elevated brain natriuretic peptide (BNP) level   . Hyponatremia   . Renal insufficiency   . NSTEMI (non-ST elevated myocardial infarction)   . Acute hyponatremia   . Chronic renal impairment, unspecified CKD stage       Current Medications:  Current Outpatient Prescriptions   Medication Sig Dispense Refill   . acetaminophen (TYLENOL) 325 MG tablet Take 2 tablets (650 mg total) by mouth every 4 (four) hours as needed for Pain or Fever.     [name] amLODIPine (NORVASC) 10 MG tablet Take 1 tablet (10 mg total) by mouth daily.     [name] aspirin EC 325 MG EC tablet Take 1 tablet (325 mg total) by mouth daily. 30 tablet 0   . bumetanide (BUMEX) 1 MG tablet Take 0.5 tablets (0.5 mg total) by mouth every morning. 30 tablet 0   . carvedilol (COREG) 25 MG tablet Take 25 mg by mouth 2 (two) times daily with meals.        . cloNIDine (CATAPRES) 0.2 MG tablet Take 1 tablet (0.2 mg total) by mouth 2 (two) times daily. 60 tablet 0   . ferrous sulfate 324 (65 FE) MG Tablet Delayed Response Take 1 tablet (324 mg total) by mouth every morning with breakfast. 30 tablet 0   . hydrALAZINE (APRESOLINE) 100 MG tablet Take 1 tablet (100 mg total) by mouth 3 (three) times daily. 60 tablet 0   . ondansetron (ZOFRAN-ODT) 4 MG disintegrating tablet Take 1 tablet (4 mg total) by mouth every 6 (six) hours as needed for Nausea. 20 tablet 0   . pantoprazole (PROTONIX) 40 MG tablet Take 1 tablet (40 mg total) by mouth every morning before breakfast. 30 tablet 0   . polyethylene glycol (MIRALAX) packet Take 17 g by mouth daily. 30 each 0   . senna-docusate (PERICOLACE) 8.6-50 MG per tablet Take 2  tablets by mouth 2 (two) times daily. 30 tablet 0   . simvastatin (ZOCOR) 10 MG tablet Take 10 mg by mouth nightly.     [name] SITagliptin (JANUVIA) 25 MG tablet Take 1 tablet (25 mg total) by mouth daily. 30 tablet 3   . traMADol (ULTRAM) 50 MG tablet Take 1 tablet (50 mg total) by mouth every 8 (eight) hours as needed (pain). 30 tablet 0   . vitamin D, ergocalciferol, (DRISDOL) 50000 UNIT Cap Take 2,000 Units by mouth 2 (two) times daily.       No current facility-administered medications for this visit.        Allergies:  Allergies   Allergen Reactions   . Mosquito (Culex Pipiens) Allergy Skin Test Shortness Of Breath     And  Trouble  breathing   . Lisinopril    . Shellfish-Derived Products Hives     New  allergy  New  allergy   . Penicillins Rash     Swelling,  numbness       Past Medical History:  Past Medical History:   Diagnosis Date   . Cataracts, bilateral    .  Chronic kidney disease    . Diabetes mellitus    . History of CVA (cerebrovascular accident) 03/2013   . History of MI (myocardial infarction)    . 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 Hypercholesteremia    . Hypertension        Past Surgical History:  Past Surgical History:   Procedure Laterality Date   . CORONARY ARTERY BYPASS N/A 06/12/2016    Procedure: Coronary Artery Bypass x 4.;  Surgeon: Bjorn Loser, MD;  Location: [facility] HEART OR;  Service: Cardiothoracic;  Laterality: N/A;  LIMA to LDA  Vein to PDA  Vein to OM  Vein to Diagonal   . ENDOSCOPIC,VEIN HARVEST N/A 06/12/2016    Procedure: Endoscopic, Left Leg Greater Saphenous Vein Harvest;  Surgeon: Bjorn Loser, MD;  Location: [facility] HEART OR;  Service: Cardiothoracic;  Laterality: N/A;  Left Leg Incision (medially, groin to ankle) @ 0742  Vein out of leg @ 0826  Vein prep complete @ 0845  Total time = 63 minutes       Family History:  Family History   Problem Relation Age of Onset   . No known problems Mother    . No known problems Father        Social History:  Social History     Social  History   . Marital status: Widowed     Spouse name: N/A   . Number of children: N/A   . Years of education: N/A     Occupational History   . Not on file.     Social History Main Topics   . Smoking status: Never Smoker   . Smokeless tobacco: Never Used   . Alcohol use No   . Drug use: No   . Sexual activity: Not on file     Other Topics Concern   . Not on file     Social History Narrative   . No narrative on file       The following sections were reviewed this encounter by the provider:        Vitals:  Wt 67.1 kg (148 lb)   BMI 28.90 kg/m

## 2016-07-13 NOTE — Telephone Encounter (Signed)
Transitional Care Management    Writer called pt daughter, Arna Snipe, with [place] interpreter [phone] to initiate management. Interpreter left voicemail with contact information. Writer notes Primary diagnosis is SIAH. Writer to d/c pt at this time-coded out.    Gae Dry, RN MSN  [place] Management  Case Manager  [phone]

## 2016-07-14 ENCOUNTER — Encounter (INDEPENDENT_AMBULATORY_CARE_PROVIDER_SITE_OTHER): Payer: Self-pay

## 2016-07-14 NOTE — Progress Notes (Signed)
TCM Medicare Focus Diagnosis Navigator  Follow Up Call      Call participant, relationship to patient and contact information: [place] @ [phone] [facility] Sunday Spillers)      Needs    Follow up needs: No needs identified    Barriers or concerns to continued wellness in the community: Daughter refuses SN visits. Nurse has rescheduled visit for today 07/14/16.    Referrals placed: PT    Next follow up call scheduled: 07/18/16      Comments    07/14/16 - TCM Medicare Navigator made outreach to Dr. John Giovanni @ [phone] and spoke to [place]ya. Navigator confirmed pt was seend yesterday 07/13/16. Navigator noted pt has no concerns, and obtained medication refill.    Navigator made outreach to [facility] @ [phone] and spoke to Nurse CM Sunday Spillers). Navigator noted assigned unit nurse has attempted to meet previously with the pt on 11/7 & 11/8. Nurse noted pt's daughter has declined SN visits. Pt has appt scheduled for today 11/10. Navigator noted nurse has verbalized the importance of SN visits. Nurse states pt's daughter verbalized "pt's meds make her dizzy." Nurse provided education to discuss medication list with PCP. Navigator will f/u on 07/18/16 to evaluate pt status.    Amanda Pea, [place] Medicare Focus Navigator  [phone]

## 2016-07-17 ENCOUNTER — Telehealth (INDEPENDENT_AMBULATORY_CARE_PROVIDER_SITE_OTHER): Payer: Self-pay

## 2016-07-17 NOTE — Telephone Encounter (Signed)
TCM Medicare Navigator    07/17/16 - TCM Medicare Navigator made outreach to [facility] @ [phone] and learned CM Sunday Spillers) is not available.    Amanda Pea, [place] Medicare Focus Navigator  [phone]

## 2016-07-18 ENCOUNTER — Other Ambulatory Visit: Payer: Self-pay

## 2016-07-18 ENCOUNTER — Encounter (INDEPENDENT_AMBULATORY_CARE_PROVIDER_SITE_OTHER): Payer: Self-pay | Admitting: Internal Medicine

## 2016-07-18 ENCOUNTER — Telehealth (INDEPENDENT_AMBULATORY_CARE_PROVIDER_SITE_OTHER): Payer: Self-pay

## 2016-07-18 NOTE — Telephone Encounter (Signed)
TCM Medicare Navigator    07/18/16 - TCM Medicare Navigator made outreach to [facility] @ [phone] and spoke to [place]. Navigator noted nurse CM is not available provided contact information for f/u.    Amanda Pea, [place] Medicare Focus Navigator  [phone]

## 2016-07-21 ENCOUNTER — Encounter (INDEPENDENT_AMBULATORY_CARE_PROVIDER_SITE_OTHER): Payer: Self-pay

## 2016-07-21 ENCOUNTER — Telehealth (INDEPENDENT_AMBULATORY_CARE_PROVIDER_SITE_OTHER): Payer: Self-pay | Admitting: Internal Medicine

## 2016-07-21 NOTE — Telephone Encounter (Signed)
[facility] called to let Dr Samule Ohm  know pt still have dizziness you can call us back any concern please # [phone]

## 2016-07-21 NOTE — Progress Notes (Signed)
TCM Medicare Focus Diagnosis Navigator  Follow Up Call      Call participant, relationship to patient and contact information: [facility] @ [phone] spoke to nurse CM Sunday Spillers.      Needs    Follow up needs: No needs identified per CM    Barriers or concerns to continued wellness in the community: S/E of medication Hydralazine triggers dizziness per nurse CM.     PCP notified for any reason: Medication barrier per nurse CM    Referrals placed: PT    Next follow up call scheduled: 07/28/16      Comments    07/21/16 - TCM Medicare Navigator made outreach to [facility] @ [phone] and spoke to [place]. Navigator noted pt is "better" and has dizziness due to Rx Hydralazine per nurse CM. Navigator noted per nurse CM pt has slight improvement and is not as dizzy prior to PCP modifying medication dosage. Navigator noted pt remains stable and will continue to provide outreach.    Amanda Pea, [place] Medicare Focus Navigator  [phone]

## 2016-07-25 ENCOUNTER — Ambulatory Visit (INDEPENDENT_AMBULATORY_CARE_PROVIDER_SITE_OTHER): Payer: Self-pay | Admitting: Cardiovascular Disease

## 2016-07-25 ENCOUNTER — Encounter (INDEPENDENT_AMBULATORY_CARE_PROVIDER_SITE_OTHER): Payer: Self-pay | Admitting: Internal Medicine

## 2016-07-25 ENCOUNTER — Ambulatory Visit (INDEPENDENT_AMBULATORY_CARE_PROVIDER_SITE_OTHER): Payer: Medicare Other | Admitting: Internal Medicine

## 2016-07-31 MED ORDER — DARBEPOETIN ALFA 60 MCG/0.3ML IJ SOSY
60.0000 ug | PREFILLED_SYRINGE | Freq: Once | INTRAMUSCULAR | Status: AC
Start: 2016-08-01 — End: 2016-08-01
  Administered 2016-08-01: 60 ug via SUBCUTANEOUS

## 2016-08-01 ENCOUNTER — Ambulatory Visit: Payer: Medicare Other | Attending: Nephrology

## 2016-08-01 DIAGNOSIS — N185 Chronic kidney disease, stage 5: Secondary | ICD-10-CM | POA: Insufficient documentation

## 2016-08-01 DIAGNOSIS — D631 Anemia in chronic kidney disease: Secondary | ICD-10-CM | POA: Insufficient documentation

## 2016-08-01 MED ORDER — DARBEPOETIN ALFA 60 MCG/0.3ML IJ SOSY
PREFILLED_SYRINGE | INTRAMUSCULAR | Status: AC
Start: 2016-08-01 — End: ?
  Filled 2016-08-01: qty 0.3

## 2016-08-01 NOTE — Progress Notes (Signed)
Pt received Aranesp inj to left deltoid region and tolerated well  RTC one week. , [name], RN

## 2016-08-03 ENCOUNTER — Other Ambulatory Visit: Payer: Self-pay

## 2016-08-04 MED ORDER — DARBEPOETIN ALFA 60 MCG/0.3ML IJ SOSY
60.0000 ug | PREFILLED_SYRINGE | Freq: Once | INTRAMUSCULAR | Status: AC
Start: 2016-08-07 — End: 2016-08-07
  Administered 2016-08-07: 60 ug via SUBCUTANEOUS

## 2016-08-07 ENCOUNTER — Ambulatory Visit
Admission: RE | Admit: 2016-08-07 | Discharge: 2016-08-07 | Disposition: A | Discharge status: Home / Self Care | Payer: Medicare Other | Source: Ambulatory Visit | Attending: Nephrology | Admitting: Nephrology

## 2016-08-07 ENCOUNTER — Ambulatory Visit: Payer: Medicare Other

## 2016-08-07 VITALS — BP 173/70 | Temp 98.3°F | Resp 17

## 2016-08-07 DIAGNOSIS — E1122 Type 2 diabetes mellitus with diabetic chronic kidney disease: Secondary | ICD-10-CM | POA: Insufficient documentation

## 2016-08-07 DIAGNOSIS — D631 Anemia in chronic kidney disease: Secondary | ICD-10-CM | POA: Insufficient documentation

## 2016-08-07 DIAGNOSIS — E785 Hyperlipidemia, unspecified: Secondary | ICD-10-CM | POA: Insufficient documentation

## 2016-08-07 DIAGNOSIS — N185 Chronic kidney disease, stage 5: Secondary | ICD-10-CM

## 2016-08-07 DIAGNOSIS — N184 Chronic kidney disease, stage 4 (severe): Secondary | ICD-10-CM | POA: Insufficient documentation

## 2016-08-07 LAB — CBC AND DIFFERENTIAL
Absolute NRBC: 0 10*3/uL
Basophils Absolute Automated: 0.03 10*3/uL (ref 0.00–0.20)
Basophils Automated: 0.5 %
Eosinophils Absolute Automated: 0.27 10*3/uL (ref 0.00–0.70)
Eosinophils Automated: 4.8 %
Hematocrit: 25.9 % — ABNORMAL LOW (ref 37.0–47.0)
Hgb: 8 g/dL — ABNORMAL LOW (ref 12.0–16.0)
Immature Granulocytes Absolute: 0.02 10*3/uL
Immature Granulocytes: 0.4 %
Lymphocytes Absolute Automated: 1.51 10*3/uL (ref 0.50–4.40)
Lymphocytes Automated: 26.7 %
MCH: 22.2 pg — ABNORMAL LOW (ref 28.0–32.0)
MCHC: 30.9 g/dL — ABNORMAL LOW (ref 32.0–36.0)
MCV: 71.7 fL — ABNORMAL LOW (ref 80.0–100.0)
Monocytes Absolute Automated: 0.59 10*3/uL (ref 0.00–1.20)
Monocytes: 10.4 %
Neutrophils Absolute: 3.24 10*3/uL (ref 1.80–8.10)
Neutrophils: 57.2 %
Nucleated RBC: 0 /100 WBC (ref 0.0–1.0)
Platelets: 159 10*3/uL (ref 140–400)
RBC: 3.61 10*6/uL — ABNORMAL LOW (ref 4.20–5.40)
RDW: 18 % — ABNORMAL HIGH (ref 12–15)
WBC: 5.66 10*3/uL (ref 3.50–10.80)

## 2016-08-07 LAB — HEMOGLOBIN A1C
Average Estimated Glucose: 139.9 mg/dL
Hemoglobin A1C: 6.5 % — ABNORMAL HIGH (ref 4.6–5.9)

## 2016-08-07 LAB — LIPID PANEL
Cholesterol / HDL Ratio: 3.8
Cholesterol: 197 mg/dL (ref 0–199)
HDL: 52 mg/dL (ref 40–9999)
LDL Calculated: 122 mg/dL — ABNORMAL HIGH (ref 0–99)
Triglycerides: 116 mg/dL (ref 34–149)
VLDL Calculated: 23 mg/dL (ref 10–40)

## 2016-08-07 LAB — MICROALBUMIN, RANDOM URINE
Urine Creatinine, Random: 81 mg/dL
Urine Microalbumin, Random: >2000 — ABNORMAL HIGH (ref 0.0–30.0)

## 2016-08-07 LAB — RENAL FUNCTION PANEL
Albumin: 2.9 g/dL — ABNORMAL LOW (ref 3.5–5.0)
Anion Gap: 7 (ref 5.0–15.0)
BUN: 21.7 mg/dL — ABNORMAL HIGH (ref 7.0–19.0)
CO2: 22 mEq/L (ref 22–29)
Calcium: 8.9 mg/dL (ref 8.5–10.5)
Chloride: 111 mEq/L (ref 100–111)
Creatinine: 2.4 mg/dL — ABNORMAL HIGH (ref 0.6–1.0)
Glucose: 118 mg/dL — ABNORMAL HIGH (ref 70–100)
Phosphorus: 4.8 mg/dL — ABNORMAL HIGH (ref 2.3–4.7)
Potassium: 4.9 mEq/L (ref 3.5–5.1)
Sodium: 140 mEq/L (ref 136–145)

## 2016-08-07 LAB — GFR: EGFR: 20.3

## 2016-08-07 LAB — IRON PROFILE
Iron Saturation: 18 % (ref 15–50)
Iron: 36 ug/dL — ABNORMAL LOW (ref 40–145)
TIBC: 203 ug/dL — ABNORMAL LOW (ref 265–497)
UIBC: 167 ug/dL (ref 126–382)

## 2016-08-07 LAB — FERRITIN: Ferritin: 325.06 ng/mL — ABNORMAL HIGH (ref 4.60–204.00)

## 2016-08-07 LAB — HEMOLYSIS INDEX: Hemolysis Index: 7 (ref 0–18)

## 2016-08-07 LAB — URIC ACID: Uric acid: 9.1 mg/dL — ABNORMAL HIGH (ref 2.6–6.0)

## 2016-08-07 MED ORDER — DARBEPOETIN ALFA 60 MCG/0.3ML IJ SOSY
PREFILLED_SYRINGE | INTRAMUSCULAR | Status: AC
Start: 2016-08-07 — End: ?
  Filled 2016-08-07: qty 0.3

## 2016-08-07 NOTE — Progress Notes (Signed)
Time: Handley is a 65 y.o. female here for Aranesp injection.  Offers no c/o's pain.  Hgb is 8.0 and Hct is 25.9 on 08/07/16.  Aranesp 60 mcg given SQ into RUA without incident.  Please see MAR, VS for more details.  PT discharged stable, NAD.  RTC 08/14/16  Mardelle Matte, RN 10:16 AM

## 2016-08-10 ENCOUNTER — Other Ambulatory Visit (INDEPENDENT_AMBULATORY_CARE_PROVIDER_SITE_OTHER): Payer: Self-pay | Admitting: Internal Medicine

## 2016-08-10 DIAGNOSIS — E119 Type 2 diabetes mellitus without complications: Secondary | ICD-10-CM

## 2016-08-10 MED ORDER — AMLODIPINE BESYLATE 10 MG PO TABS
10.0000 mg | ORAL_TABLET | Freq: Every day | ORAL | 1 refills | Status: DC
Start: 2016-08-10 — End: 2017-11-15

## 2016-08-10 MED ORDER — BUMETANIDE 0.5 MG PO TABS
0.5000 mg | ORAL_TABLET | Freq: Every day | ORAL | 1 refills | Status: DC
Start: 2016-08-10 — End: 2017-03-22

## 2016-08-10 MED ORDER — SITAGLIPTIN PHOSPHATE 25 MG PO TABS
25.0000 mg | ORAL_TABLET | Freq: Every day | ORAL | 1 refills | Status: DC
Start: 2016-08-10 — End: 2016-10-02

## 2016-08-10 MED ORDER — HYDRALAZINE HCL 100 MG PO TABS
100.0000 mg | ORAL_TABLET | Freq: Three times a day (TID) | ORAL | 1 refills | Status: DC
Start: 2016-08-10 — End: 2017-06-15

## 2016-08-10 MED ORDER — PANTOPRAZOLE SODIUM 40 MG PO TBEC
40.0000 mg | DELAYED_RELEASE_TABLET | Freq: Every morning | ORAL | 1 refills | Status: DC
Start: 2016-08-10 — End: 2017-01-11

## 2016-08-10 MED ORDER — ONDANSETRON 4 MG PO TBDP
4.0000 mg | ORAL_TABLET | Freq: Four times a day (QID) | ORAL | 3 refills | Status: DC | PRN
Start: 2016-08-10 — End: 2016-09-12

## 2016-08-10 MED ORDER — FERROUS SULFATE 324 (65 FE) MG PO TBEC
324.0000 mg | DELAYED_RELEASE_TABLET | Freq: Every morning | ORAL | 1 refills | Status: DC
Start: 2016-08-10 — End: 2017-03-22

## 2016-08-10 NOTE — Telephone Encounter (Signed)
LOV: 07/24/16

## 2016-08-11 ENCOUNTER — Encounter (INDEPENDENT_AMBULATORY_CARE_PROVIDER_SITE_OTHER): Payer: Self-pay | Admitting: Internal Medicine

## 2016-08-11 MED ORDER — DARBEPOETIN ALFA 60 MCG/0.3ML IJ SOSY
60.0000 ug | PREFILLED_SYRINGE | Freq: Once | INTRAMUSCULAR | Status: AC
Start: 2016-08-14 — End: 2016-08-14
  Administered 2016-08-14: 60 ug via SUBCUTANEOUS

## 2016-08-14 ENCOUNTER — Ambulatory Visit: Payer: Medicare Other

## 2016-08-14 VITALS — BP 159/75 | Temp 98.5°F | Resp 17

## 2016-08-14 DIAGNOSIS — N185 Chronic kidney disease, stage 5: Secondary | ICD-10-CM

## 2016-08-14 MED ORDER — DARBEPOETIN ALFA 60 MCG/0.3ML IJ SOSY
PREFILLED_SYRINGE | INTRAMUSCULAR | Status: AC
Start: 2016-08-14 — End: ?
  Filled 2016-08-14: qty 0.3

## 2016-08-14 NOTE — Progress Notes (Signed)
Time: Eldridge is a 65 y.o. female here for Aranesp injection.  Offers no c/o's pain.  Hgb is 8.0 and Hct is 25.9 on 08/07/16.  Aranesp 60 mcg given SQ into LUA without incident.  Please see MAR, VS for more details.  PT discharged stable, NAD.  RTC 08/22/16  Mardelle Matte, RN 11:39 AM

## 2016-08-16 ENCOUNTER — Encounter (INDEPENDENT_AMBULATORY_CARE_PROVIDER_SITE_OTHER): Payer: Self-pay | Admitting: Internal Medicine

## 2016-08-17 ENCOUNTER — Ambulatory Visit (INDEPENDENT_AMBULATORY_CARE_PROVIDER_SITE_OTHER): Payer: Self-pay

## 2016-08-21 MED ORDER — DARBEPOETIN ALFA 60 MCG/0.3ML IJ SOSY
60.0000 ug | PREFILLED_SYRINGE | Freq: Once | INTRAMUSCULAR | Status: AC
Start: 2016-08-22 — End: 2016-08-22
  Administered 2016-08-22: 60 ug via SUBCUTANEOUS

## 2016-08-22 ENCOUNTER — Ambulatory Visit: Payer: Medicare Other

## 2016-08-22 DIAGNOSIS — N185 Chronic kidney disease, stage 5: Secondary | ICD-10-CM

## 2016-08-22 MED ORDER — DARBEPOETIN ALFA 60 MCG/0.3ML IJ SOSY
PREFILLED_SYRINGE | INTRAMUSCULAR | Status: AC
Start: 2016-08-22 — End: ?
  Filled 2016-08-22: qty 0.3

## 2016-08-22 NOTE — Progress Notes (Signed)
Pt parameters met for injection and receive to her right deltoid wthout issue [name], RN

## 2016-08-25 ENCOUNTER — Encounter (INDEPENDENT_AMBULATORY_CARE_PROVIDER_SITE_OTHER): Payer: Self-pay | Admitting: Internal Medicine

## 2016-08-25 MED ORDER — DARBEPOETIN ALFA 60 MCG/0.3ML IJ SOSY
60.0000 ug | PREFILLED_SYRINGE | Freq: Once | INTRAMUSCULAR | Status: AC
Start: 2016-08-29 — End: 2016-08-29
  Administered 2016-08-29: 60 ug via SUBCUTANEOUS

## 2016-08-29 ENCOUNTER — Ambulatory Visit: Payer: Medicare Other

## 2016-08-29 VITALS — BP 138/75 | HR 71 | Temp 98.0°F | Resp 17

## 2016-08-29 DIAGNOSIS — N185 Chronic kidney disease, stage 5: Secondary | ICD-10-CM

## 2016-08-29 MED ORDER — DARBEPOETIN ALFA 60 MCG/0.3ML IJ SOSY
PREFILLED_SYRINGE | INTRAMUSCULAR | Status: AC
Start: 2016-08-29 — End: ?
  Filled 2016-08-29: qty 0.3

## 2016-08-29 NOTE — Progress Notes (Signed)
Time: McCaskill is a 65 y.o. female here for Aranesp injection.  Offers no c/o's pain.  Hgb is 8.0 and Hct is 25.9 on 08/07/16.  Aranesp 60 mcg given SQ into LUA without incident.  Please see MAR, VS for more details.  PT discharged stable, NAD.  RTC 09/05/16  Mardelle Matte, RN 12:55 PM

## 2016-08-31 ENCOUNTER — Encounter (INDEPENDENT_AMBULATORY_CARE_PROVIDER_SITE_OTHER): Payer: Self-pay | Admitting: Internal Medicine

## 2016-09-01 MED ORDER — DARBEPOETIN ALFA 60 MCG/0.3ML IJ SOSY
60.0000 ug | PREFILLED_SYRINGE | Freq: Once | INTRAMUSCULAR | Status: DC
Start: 2016-09-05 — End: 2016-09-05

## 2016-09-05 ENCOUNTER — Ambulatory Visit
Admission: RE | Admit: 2016-09-05 | Discharge: 2016-09-05 | Disposition: A | Discharge status: Home / Self Care | Payer: Medicare Other | Source: Ambulatory Visit | Attending: Nephrology | Admitting: Nephrology

## 2016-09-05 ENCOUNTER — Ambulatory Visit: Payer: Medicare Other

## 2016-09-05 VITALS — BP 142/65 | HR 77 | Temp 98.7°F | Resp 17

## 2016-09-05 DIAGNOSIS — D631 Anemia in chronic kidney disease: Secondary | ICD-10-CM | POA: Insufficient documentation

## 2016-09-05 DIAGNOSIS — N185 Chronic kidney disease, stage 5: Secondary | ICD-10-CM

## 2016-09-05 LAB — CBC
Absolute NRBC: 0 10*3/uL
Hematocrit: 34.7 % — ABNORMAL LOW (ref 37.0–47.0)
Hgb: 10.6 g/dL — ABNORMAL LOW (ref 12.0–16.0)
MCH: 22.2 pg — ABNORMAL LOW (ref 28.0–32.0)
MCHC: 30.5 g/dL — ABNORMAL LOW (ref 32.0–36.0)
MCV: 72.7 fL — ABNORMAL LOW (ref 80.0–100.0)
MPV: 10.1 fL (ref 9.4–12.3)
Nucleated RBC: 0 /100 WBC (ref 0.0–1.0)
Platelets: 239 10*3/uL (ref 140–400)
RBC: 4.77 10*6/uL (ref 4.20–5.40)
RDW: 17 % — ABNORMAL HIGH (ref 12–15)
WBC: 5.89 10*3/uL (ref 3.50–10.80)

## 2016-09-05 LAB — HEMOLYSIS INDEX: Hemolysis Index: 19 — ABNORMAL HIGH (ref 0–18)

## 2016-09-05 LAB — FERRITIN: Ferritin: 126.45 ng/mL (ref 4.60–204.00)

## 2016-09-05 NOTE — Progress Notes (Signed)
Time: Jacqueline Weber is a 66 y.o. female here for Aranesp injection.  Offers no c/o's pain.  Hgb is 10.6 and Hct is 34.7 today. Based on the Hgb level today, PT did not need her Aranesp injection for this month.  Please see MAR, VS for more details.  PT discharged stable, NAD.  RTC 10/02/16  Mardelle Matte, RN 2:49 PM

## 2016-09-12 ENCOUNTER — Encounter (INDEPENDENT_AMBULATORY_CARE_PROVIDER_SITE_OTHER): Payer: Self-pay | Admitting: Internal Medicine

## 2016-09-12 ENCOUNTER — Ambulatory Visit (FREE_STANDING_LABORATORY_FACILITY): Payer: Medicare Other | Admitting: Internal Medicine

## 2016-09-12 VITALS — BP 208/84 | HR 92 | Temp 97.9°F | Resp 18 | Wt 139.0 lb

## 2016-09-12 DIAGNOSIS — I10 Essential (primary) hypertension: Secondary | ICD-10-CM

## 2016-09-12 DIAGNOSIS — K649 Unspecified hemorrhoids: Secondary | ICD-10-CM

## 2016-09-12 DIAGNOSIS — R1084 Generalized abdominal pain: Secondary | ICD-10-CM

## 2016-09-12 DIAGNOSIS — E119 Type 2 diabetes mellitus without complications: Secondary | ICD-10-CM

## 2016-09-12 DIAGNOSIS — J309 Allergic rhinitis, unspecified: Secondary | ICD-10-CM

## 2016-09-12 LAB — CBC AND DIFFERENTIAL
Absolute NRBC: 0 10*3/uL
Basophils Absolute Automated: 0.02 10*3/uL (ref 0.00–0.20)
Basophils Automated: 0.3 %
Eosinophils Absolute Automated: 0.14 10*3/uL (ref 0.00–0.70)
Eosinophils Automated: 2.2 %
Hematocrit: 36.7 % — ABNORMAL LOW (ref 37.0–47.0)
Hgb: 11.2 g/dL — ABNORMAL LOW (ref 12.0–16.0)
Immature Granulocytes Absolute: 0.02 10*3/uL
Immature Granulocytes: 0.3 %
Lymphocytes Absolute Automated: 1.1 10*3/uL (ref 0.50–4.40)
Lymphocytes Automated: 17.2 %
MCH: 21.7 pg — ABNORMAL LOW (ref 28.0–32.0)
MCHC: 30.5 g/dL — ABNORMAL LOW (ref 32.0–36.0)
MCV: 71 fL — ABNORMAL LOW (ref 80.0–100.0)
Monocytes Absolute Automated: 0.44 10*3/uL (ref 0.00–1.20)
Monocytes: 6.9 %
Neutrophils Absolute: 4.67 10*3/uL (ref 1.80–8.10)
Neutrophils: 73.1 %
Nucleated RBC: 0 /100 WBC (ref 0.0–1.0)
Platelets: 176 10*3/uL (ref 140–400)
RBC: 5.17 10*6/uL (ref 4.20–5.40)
RDW: 16 % — ABNORMAL HIGH (ref 12–15)
WBC: 6.39 10*3/uL (ref 3.50–10.80)

## 2016-09-12 LAB — COMPREHENSIVE METABOLIC PANEL
ALT: 8 U/L (ref 0–55)
AST (SGOT): 20 U/L (ref 5–34)
Albumin/Globulin Ratio: 0.9 (ref 0.9–2.2)
Albumin: 2.6 g/dL — ABNORMAL LOW (ref 3.5–5.0)
Alkaline Phosphatase: 55 U/L (ref 37–106)
BUN: 34 mg/dL — ABNORMAL HIGH (ref 7.0–19.0)
Bilirubin, Total: 0.3 mg/dL (ref 0.1–1.2)
CO2: 17 mEq/L — ABNORMAL LOW (ref 21–29)
Calcium: 8.6 mg/dL (ref 8.5–10.5)
Chloride: 112 mEq/L — ABNORMAL HIGH (ref 100–111)
Creatinine: 2.6 mg/dL — ABNORMAL HIGH (ref 0.4–1.5)
Globulin: 3 g/dL (ref 2.0–3.7)
Glucose: 134 mg/dL — ABNORMAL HIGH (ref 70–100)
Potassium: 4.3 mEq/L (ref 3.5–5.1)
Protein, Total: 5.6 g/dL — ABNORMAL LOW (ref 6.0–8.3)
Sodium: 139 mEq/L (ref 136–145)

## 2016-09-12 LAB — URINALYSIS
Bilirubin, UA: NEGATIVE
Blood, UA: NEGATIVE
Glucose, UA: 100 — AB
Ketones UA: NEGATIVE
Leukocyte Esterase, UA: NEGATIVE
Nitrite, UA: NEGATIVE
Protein, UR: >=1000 — AB
Specific Gravity UA: 1.019 (ref 1.001–1.035)
Urine pH: 6 (ref 5.0–8.0)
Urobilinogen, UA: 0.2

## 2016-09-12 LAB — POCT HEMOGLOBIN A1C: POCT Hgb A1C: 5 (ref 3.9–5.9)

## 2016-09-12 LAB — AMYLASE: Amylase: 83 U/L (ref 25–125)

## 2016-09-12 LAB — LIPASE: Lipase: 27 U/L (ref 8–78)

## 2016-09-12 LAB — GFR: EGFR: 18.5

## 2016-09-12 LAB — HEMOLYSIS INDEX: Hemolysis Index: 23 — ABNORMAL HIGH (ref 0–18)

## 2016-09-12 MED ORDER — ONDANSETRON 4 MG PO TBDP
4.0000 mg | ORAL_TABLET | Freq: Four times a day (QID) | ORAL | 3 refills | Status: DC | PRN
Start: 2016-09-12 — End: 2017-05-15

## 2016-09-12 MED ORDER — POLYETHYLENE GLYCOL 3350 17 G PO PACK
17.0000 g | PACK | Freq: Every day | ORAL | 3 refills | Status: DC
Start: 2016-09-12 — End: 2016-09-12

## 2016-09-12 MED ORDER — HYDROCORTISONE ACETATE 25 MG RE SUPP
25.0000 mg | Freq: Two times a day (BID) | RECTAL | 1 refills | Status: DC
Start: 2016-09-12 — End: 2016-10-02

## 2016-09-12 MED ORDER — POLYETHYLENE GLYCOL 3350 17 G PO PACK
17.0000 g | PACK | Freq: Every day | ORAL | 3 refills | Status: DC
Start: 2016-09-12 — End: 2017-06-15

## 2016-09-12 MED ORDER — FEXOFENADINE HCL 180 MG PO TABS
180.0000 mg | ORAL_TABLET | Freq: Every day | ORAL | 0 refills | Status: DC
Start: 2016-09-12 — End: 2016-10-02

## 2016-09-12 NOTE — Progress Notes (Signed)
Subjective:      Date: 09/12/2016 12:59 PM   Patient ID: Jacqueline Weber is a 66 y.o. female.    Chief Complaint:  Chief Complaint   Patient presents with   . Diabetes       HPI:  Diabetes   She presents for her follow-up diabetic visit. She has type 2 diabetes mellitus. No MedicAlert identification noted. Hypoglycemia symptoms include headaches. Associated symptoms include weight loss. Pertinent negatives for diabetes include no blurred vision and no chest pain. There are no hypoglycemic complications. Risk factors for coronary artery disease include diabetes mellitus and dyslipidemia. Current diabetic treatment includes insulin pump and oral agent (dual therapy). She is compliant with treatment all of the time. Her weight is decreasing steadily. She has not had a previous visit with a dietitian. She rarely participates in exercise. Her dinner blood glucose is taken between 7-8 pm. Her dinner blood glucose range is generally 110-130 mg/dl. She does not see a podiatrist.Eye exam is not current.   Hypertension   This is a chronic problem. The current episode started more than 1 year ago. The problem is uncontrolled. Associated symptoms include headaches. Pertinent negatives include no blurred vision, chest pain, malaise/fatigue or palpitations. Past treatments include diuretics, beta blockers and calcium channel blockers (clonidine).   BP 140/76 last visit at infusion center last week  Threw up today's meds  Nausea/Vomiting  Started this morning  Vomited X 1  No abd pain, diarrhea  Is constipated now  Has rhinorrhea, itchy eyes and nose  No F/C  Hemorrhoids  Has been present for the last few months  Used prep H with some relief    Last saw nephrologist 1 month ago,is supposed to f/u in 2 months  Last saw cardiologist 1 month ago, will f/u after colonoscopy     Problem List:  Patient Active Problem List   Diagnosis   . Type 2 diabetes mellitus without complication   . HTN (hypertension) urgency   . Hyperlipidemia   .  History of CVA (cerebrovascular accident)   . Elevated brain natriuretic peptide (BNP) level   . Hyponatremia   . Renal insufficiency   . NSTEMI (non-ST elevated myocardial infarction)   . Acute hyponatremia   . Chronic renal impairment, unspecified CKD stage       Current Medications:  Current Outpatient Prescriptions   Medication Sig Dispense Refill   . acetaminophen (TYLENOL) 325 MG tablet Take 2 tablets (650 mg total) by mouth every 4 (four) hours as needed for Pain or Fever.     [name] amLODIPine (NORVASC) 10 MG tablet Take 1 tablet (10 mg total) by mouth daily. 90 tablet 1   . aspirin EC 325 MG EC tablet Take 1 tablet (325 mg total) by mouth daily. 30 tablet 0   . bumetanide (BUMEX) 0.5 MG tablet Take 1 tablet (0.5 mg total) by mouth daily. 90 tablet 1   . carvedilol (COREG) 25 MG tablet 12.5 mg 2 (two) times daily with meals.         . cloNIDine (CATAPRES) 0.2 MG tablet Take 1 tablet (0.2 mg total) by mouth 2 (two) times daily. 60 tablet 0   . ferrous sulfate 324 (65 FE) MG Tablet Delayed Response Take 1 tablet (324 mg total) by mouth every morning with breakfast. 90 tablet 1   . glucose blood test strip Check BS qam 100 each 3   . hydrALAZINE (APRESOLINE) 100 MG tablet Take 1 tablet (100 mg total) by mouth 3 (  three) times daily. (Patient taking differently: Take 100 mg by mouth 2 (two) times daily.    ) 270 tablet 1   . Lancet Device Misc Check BS qam 100 each 3   . pantoprazole (PROTONIX) 40 MG tablet Take 1 tablet (40 mg total) by mouth every morning before breakfast. 90 tablet 1   . polyethylene glycol (MIRALAX) packet Take 17 g by mouth daily. 30 each 3   . senna-docusate (PERICOLACE) 8.6-50 MG per tablet Take 2 tablets by mouth 2 (two) times daily. 30 tablet 0   . simvastatin (ZOCOR) 20 MG tablet Take 20 mg by mouth nightly.         [name] SITagliptin (JANUVIA) 25 MG tablet Take 1 tablet (25 mg total) by mouth daily. 90 tablet 1   . vitamin D, ergocalciferol, (DRISDOL) 50000 UNIT Cap Take 2,000 Units by mouth 2  (two) times daily.     . fexofenadine (ALLEGRA ALLERGY) 180 MG tablet Take 1 tablet (180 mg total) by mouth daily. 30 tablet 0   . hydrocortisone (ANUSOL-HC) 25 MG suppository Place 1 suppository (25 mg total) rectally 2 (two) times daily. 10 suppository 1   . ondansetron (ZOFRAN-ODT) 4 MG disintegrating tablet Take 1 tablet (4 mg total) by mouth every 6 (six) hours as needed for Nausea. 20 tablet 3     No current facility-administered medications for this visit.        Allergies:  Allergies   Allergen Reactions   . Mosquito (Culex Pipiens) Allergy Skin Test Shortness Of Breath     And  Trouble  breathing   . Lisinopril    . Shellfish-Derived Products Hives     New  allergy  New  allergy   . Penicillins Rash     Swelling,  numbness       Past Medical History:  Past Medical History:   Diagnosis Date   . Cataracts, bilateral    . Chronic kidney disease    . Diabetes mellitus    . History of CVA (cerebrovascular accident) 03/2013   . History of MI (myocardial infarction)    . 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 Hypercholesteremia    . Hypertension        Past Surgical History:  Past Surgical History:   Procedure Laterality Date   . CORONARY ARTERY BYPASS N/A 06/12/2016    Procedure: Coronary Artery Bypass x 4.;  Surgeon: Bjorn Loser, MD;  Location: [facility] HEART OR;  Service: Cardiothoracic;  Laterality: N/A;  LIMA to LDA  Vein to PDA  Vein to OM  Vein to Diagonal   . ENDOSCOPIC,VEIN HARVEST N/A 06/12/2016    Procedure: Endoscopic, Left Leg Greater Saphenous Vein Harvest;  Surgeon: Bjorn Loser, MD;  Location: [facility] HEART OR;  Service: Cardiothoracic;  Laterality: N/A;  Left Leg Incision (medially, groin to ankle) @ 0742  Vein out of leg @ 0826  Vein prep complete @ 0845  Total time = 63 minutes       Family History:  Family History   Problem Relation Age of Onset   . No known problems Mother    . No known problems Father        Social History:  Social History     Social History   . Marital status: Widowed      Spouse name: N/A   . Number of children: N/A   . Years of education: N/A     Occupational  History   . Not on file.     Social History Main Topics   . Smoking status: Never Smoker   . Smokeless tobacco: Never Used   . Alcohol use No   . Drug use: No   . Sexual activity: Not on file     Other Topics Concern   . Not on file     Social History Narrative   . No narrative on file       The following sections were reviewed this encounter by the provider:        Vitals:  BP (!) 208/84 (BP Site: Right arm)   Pulse 92   Temp 97.9 F (36.6 C) (Oral)   Resp 18   Wt 63 kg (139 lb)   SpO2 95%   BMI 27.15 kg/m      Review of Systems   Constitutional: Positive for weight loss. Negative for chills, fever and malaise/fatigue.   HENT: Positive for congestion. Negative for ear pain and sore throat.    Eyes: Negative for blurred vision.   Respiratory: Positive for cough.    Cardiovascular: Negative for chest pain and palpitations.   Gastrointestinal: Positive for constipation, nausea and vomiting. Negative for abdominal pain and diarrhea.   Neurological: Positive for headaches.     General Examination:   GENERAL APPEARANCE: alert, in no acute distress, well developed, well nourished, oriented to time, place, and person.   HEAD: normal appearance, atraumatic.   EYES: extraocular movement intact (EOMI), pupils equal, round, reactive to light and accommodation, sclera anicteric, conjunctiva clear.   EARS: tympanic membranes normal bilaterally, external canals normal .   NOSE: normal nasal mucosa, no lesions.   ORAL CAVITY: normal oropharynx, normal lips, mucosa moist, no lesions.   THROAT: normal appearance, clear, no erythema.   NECK/THYROID: neck supple,  no cervical lymphadenopathy, no neck mass palpated, no thyromegaly.   LYMPH NODES: no palpable adenopathy.   SKIN: good turgor, no rashes, no suspicious lesions.   HEART: S1, S2 normal, no murmurs, rubs, gallops, regular rate and rhythm.   LUNGS: normal effort / no distress,  normal breath sounds, clear to auscultation bilaterally, no wheezes, rales, rhonchi.   ABDOMEN: bowel sounds present, no hepatosplenomegaly, soft, mildly diffusely tender no rebound or guarding, nondistended.   MUSCULOSKELETAL: full range of motion, no swelling or deformity.   EXTREMITIES: no edema  NEUROLOGIC: nonfocal, cranial nerves 2-12 grossly intact,    PSYCH: cognitive function intact, mood/affect full range, speech clear.      1. Essential hypertension  BP not at goal, mostly likely since pt threw up meds. BP near normal range last week at infusion center.  Pt will retake meds after taking zofran,  F/u next week to recheck BP      2. Type 2 diabetes mellitus without complication, without long-term current use of insulin  - POCT Hemoglobin A1C 5.0  Doing well  Trial off sitagliptin now that pt is following diabetic diet    3. Generalized abdominal pain  - ondansetron (ZOFRAN-ODT) 4 MG disintegrating tablet; Take 1 tablet (4 mg total) by mouth every 6 (six) hours as needed for Nausea.  Dispense: 20 tablet; Refill: 3  - CBC and differential  - Comprehensive metabolic panel  - Urinalysis  - Amylase  - Lipase  Bland diet    4. Hemorrhoids, unspecified hemorrhoid type  - hydrocortisone (ANUSOL-HC) 25 MG suppository; Place 1 suppository (25 mg total) rectally 2 (two) times daily.  Dispense: 10 suppository; Refill: 1  -  Ambulatory referral to Colorectal Surgery  - polyethylene glycol (MIRALAX) packet; Take 17 g by mouth daily.  Dispense: 30 each; Refill: 3    5. Acute allergic rhinitis, unspecified seasonality, unspecified trigger  - fexofenadine (ALLEGRA ALLERGY) 180 MG tablet; Take 1 tablet (180 mg total) by mouth daily.  Dispense: 30 tablet; Refill: 0      John Giovanni, MD

## 2016-09-14 ENCOUNTER — Encounter (INDEPENDENT_AMBULATORY_CARE_PROVIDER_SITE_OTHER): Payer: Self-pay | Admitting: Internal Medicine

## 2016-09-15 ENCOUNTER — Encounter (INDEPENDENT_AMBULATORY_CARE_PROVIDER_SITE_OTHER): Payer: Self-pay | Admitting: Internal Medicine

## 2016-09-19 ENCOUNTER — Encounter (INDEPENDENT_AMBULATORY_CARE_PROVIDER_SITE_OTHER): Payer: Medicare Other | Admitting: Internal Medicine

## 2016-09-19 ENCOUNTER — Ambulatory Visit (INDEPENDENT_AMBULATORY_CARE_PROVIDER_SITE_OTHER): Payer: Self-pay | Admitting: Cardiovascular Disease

## 2016-09-21 ENCOUNTER — Ambulatory Visit (FREE_STANDING_LABORATORY_FACILITY): Payer: Medicare Other | Admitting: Internal Medicine

## 2016-09-21 ENCOUNTER — Encounter (INDEPENDENT_AMBULATORY_CARE_PROVIDER_SITE_OTHER): Payer: Self-pay | Admitting: Internal Medicine

## 2016-09-21 VITALS — BP 96/58 | HR 74 | Temp 97.5°F | Resp 15 | Ht 60.0 in | Wt 132.0 lb

## 2016-09-21 DIAGNOSIS — E119 Type 2 diabetes mellitus without complications: Secondary | ICD-10-CM

## 2016-09-21 DIAGNOSIS — I10 Essential (primary) hypertension: Secondary | ICD-10-CM

## 2016-09-21 DIAGNOSIS — M79671 Pain in right foot: Secondary | ICD-10-CM

## 2016-09-21 DIAGNOSIS — E7800 Pure hypercholesterolemia, unspecified: Secondary | ICD-10-CM

## 2016-09-21 HISTORY — DX: Type 2 diabetes mellitus without complications: E11.9

## 2016-09-21 LAB — CBC AND DIFFERENTIAL
Absolute NRBC: 0 10*3/uL
Basophils Absolute Automated: 0.02 10*3/uL (ref 0.00–0.20)
Basophils Automated: 0.2 %
Eosinophils Absolute Automated: 0.04 10*3/uL (ref 0.00–0.70)
Eosinophils Automated: 0.5 %
Hematocrit: 31.1 % — ABNORMAL LOW (ref 37.0–47.0)
Hgb: 9.4 g/dL — ABNORMAL LOW (ref 12.0–16.0)
Immature Granulocytes Absolute: 0.03 10*3/uL
Immature Granulocytes: 0.4 %
Lymphocytes Absolute Automated: 1.33 10*3/uL (ref 0.50–4.40)
Lymphocytes Automated: 15.8 %
MCH: 21.4 pg — ABNORMAL LOW (ref 28.0–32.0)
MCHC: 30.2 g/dL — ABNORMAL LOW (ref 32.0–36.0)
MCV: 70.8 fL — ABNORMAL LOW (ref 80.0–100.0)
Monocytes Absolute Automated: 0.76 10*3/uL (ref 0.00–1.20)
Monocytes: 9 %
Neutrophils Absolute: 6.24 10*3/uL (ref 1.80–8.10)
Neutrophils: 74.1 %
Nucleated RBC: 0 /100 WBC (ref 0.0–1.0)
Platelets: 190 10*3/uL (ref 140–400)
RBC: 4.39 10*6/uL (ref 4.20–5.40)
RDW: 15 % (ref 12–15)
WBC: 8.42 10*3/uL (ref 3.50–10.80)

## 2016-09-21 LAB — COMPREHENSIVE METABOLIC PANEL
ALT: <6 U/L (ref 0–55)
AST (SGOT): 11 U/L (ref 5–34)
Albumin/Globulin Ratio: 0.7 — ABNORMAL LOW (ref 0.9–2.2)
Albumin: 2.2 g/dL — ABNORMAL LOW (ref 3.5–5.0)
Alkaline Phosphatase: 62 U/L (ref 37–106)
BUN: 37 mg/dL — ABNORMAL HIGH (ref 7.0–19.0)
Bilirubin, Total: 0.2 mg/dL (ref 0.1–1.2)
CO2: 21 mEq/L (ref 21–29)
Calcium: 8 mg/dL — ABNORMAL LOW (ref 8.5–10.5)
Chloride: 106 mEq/L (ref 100–111)
Creatinine: 2.8 mg/dL — ABNORMAL HIGH (ref 0.4–1.5)
Globulin: 3 g/dL (ref 2.0–3.7)
Glucose: 186 mg/dL — ABNORMAL HIGH (ref 70–100)
Potassium: 4.6 mEq/L (ref 3.5–5.1)
Protein, Total: 5.2 g/dL — ABNORMAL LOW (ref 6.0–8.3)
Sodium: 135 mEq/L — ABNORMAL LOW (ref 136–145)

## 2016-09-21 LAB — URIC ACID: Uric acid: 8.4 mg/dL — ABNORMAL HIGH (ref 2.6–6.0)

## 2016-09-21 LAB — GFR: EGFR: 17

## 2016-09-21 LAB — HEMOLYSIS INDEX: Hemolysis Index: 6 (ref 0–18)

## 2016-09-21 NOTE — Progress Notes (Signed)
Subjective:      Date: 09/21/2016 12:04 PM   Patient ID: Jacqueline Weber is a 66 y.o. female.    Chief Complaint:  Chief Complaint   Patient presents with   . Hypertension     F/U       HPI:  Hypertension   This is a chronic problem. The current episode started more than 1 year ago. The problem has been gradually improving since onset. The problem is controlled. Associated symptoms include chest pain and malaise/fatigue. Pertinent negatives include no headaches, palpitations or shortness of breath. (None) There are no associated agents to hypertension. Risk factors for coronary artery disease include diabetes mellitus. Treatments tried: Norvasc, hyralazine and carvedilol. The current treatment provides significant improvement. There are no compliance problems.  none. none.   Diabetes   She presents for her follow-up diabetic visit. She has type 2 diabetes mellitus. Her disease course has been improving. There are no hypoglycemic associated symptoms. Pertinent negatives for hypoglycemia include no headaches. Associated symptoms include chest pain and weight loss. There are no hypoglycemic complications. Current diabetic treatment includes oral agent (monotherapy) (pt was advised to stop [place] now that she is on a diabetic diet and HgbA1c was 5.  did not stop meds).   Foot pain  C/o pain right foot starting yesterday.  No trauma  Pain with walking, feels like stabbing needles in foot  Foot is warm to touch.    Just saw cardiologist 2 days ago, given clearace for colonoscopy, scheduled for 1/31    Nursing Documentation:  Limb alert status: Patient asked and denied any limb restrictions for blood pressure/blood draws.  Has the patient seen any other providers since their last visit: no  The patient is due for colonoscopy    Problem List:  Patient Active Problem List   Diagnosis   . Type 2 diabetes mellitus without complication   . HTN (hypertension) urgency   . Hyperlipidemia   . History of CVA (cerebrovascular  accident)   . Elevated brain natriuretic peptide (BNP) level   . Hyponatremia   . Renal insufficiency   . NSTEMI (non-ST elevated myocardial infarction)   . Acute hyponatremia   . Chronic renal impairment, unspecified CKD stage       Current Medications:  Current Outpatient Prescriptions   Medication Sig Dispense Refill   . acetaminophen (TYLENOL) 325 MG tablet Take 2 tablets (650 mg total) by mouth every 4 (four) hours as needed for Pain or Fever.     [name] amLODIPine (NORVASC) 10 MG tablet Take 1 tablet (10 mg total) by mouth daily. 90 tablet 1   . aspirin EC 325 MG EC tablet Take 1 tablet (325 mg total) by mouth daily. 30 tablet 0   . bumetanide (BUMEX) 0.5 MG tablet Take 1 tablet (0.5 mg total) by mouth daily. 90 tablet 1   . carvedilol (COREG) 25 MG tablet 12.5 mg 2 (two) times daily with meals.         . ferrous sulfate 324 (65 FE) MG Tablet Delayed Response Take 1 tablet (324 mg total) by mouth every morning with breakfast. 90 tablet 1   . hydrALAZINE (APRESOLINE) 100 MG tablet Take 1 tablet (100 mg total) by mouth 3 (three) times daily. (Patient taking differently: Take 100 mg by mouth 2 (two) times daily.    ) 270 tablet 1   . ondansetron (ZOFRAN-ODT) 4 MG disintegrating tablet Take 1 tablet (4 mg total) by mouth every 6 (six) hours as needed for  Nausea. 20 tablet 3   . pantoprazole (PROTONIX) 40 MG tablet Take 1 tablet (40 mg total) by mouth every morning before breakfast. 90 tablet 1   . polyethylene glycol (MIRALAX) packet Take 17 g by mouth daily. 30 each 3   . senna-docusate (PERICOLACE) 8.6-50 MG per tablet Take 2 tablets by mouth 2 (two) times daily. 30 tablet 0   . simvastatin (ZOCOR) 20 MG tablet Take 20 mg by mouth nightly.         [name] SITagliptin (JANUVIA) 25 MG tablet Take 1 tablet (25 mg total) by mouth daily. 90 tablet 1   . vitamin D, ergocalciferol, (DRISDOL) 50000 UNIT Cap Take 2,000 Units by mouth 2 (two) times daily.     . cloNIDine (CATAPRES) 0.2 MG tablet Take 1 tablet (0.2 mg total) by mouth  2 (two) times daily. 60 tablet 0   . fexofenadine (ALLEGRA ALLERGY) 180 MG tablet Take 1 tablet (180 mg total) by mouth daily. 30 tablet 0   . glucose blood test strip Check BS qam 100 each 3   . hydrocortisone (ANUSOL-HC) 25 MG suppository Place 1 suppository (25 mg total) rectally 2 (two) times daily. 10 suppository 1   . Lancet Device Misc Check BS qam 100 each 3     No current facility-administered medications for this visit.        Allergies:  Allergies   Allergen Reactions   . Mosquito (Culex Pipiens) Allergy Skin Test Shortness Of Breath     And  Trouble  breathing   . Lisinopril    . Shellfish-Derived Products Hives     New  allergy  New  allergy   . Penicillins Rash     Swelling,  numbness       Past Medical History:  Past Medical History:   Diagnosis Date   . Cataracts, bilateral    . Chronic kidney disease    . Diabetes mellitus    . History of CVA (cerebrovascular accident) 03/2013   . History of MI (myocardial infarction)    . 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 Hypercholesteremia    . Hypertension        Past Surgical History:  Past Surgical History:   Procedure Laterality Date   . CORONARY ARTERY BYPASS N/A 06/12/2016    Procedure: Coronary Artery Bypass x 4.;  Surgeon: Bjorn Loser, MD;  Location: [facility] HEART OR;  Service: Cardiothoracic;  Laterality: N/A;  LIMA to LDA  Vein to PDA  Vein to OM  Vein to Diagonal   . ENDOSCOPIC,VEIN HARVEST N/A 06/12/2016    Procedure: Endoscopic, Left Leg Greater Saphenous Vein Harvest;  Surgeon: Bjorn Loser, MD;  Location: [facility] HEART OR;  Service: Cardiothoracic;  Laterality: N/A;  Left Leg Incision (medially, groin to ankle) @ 0742  Vein out of leg @ 0826  Vein prep complete @ 0845  Total time = 63 minutes       Family History:  Family History   Problem Relation Age of Onset   . No known problems Mother    . No known problems Father        Social History:  Social History     Social History   . Marital status: Widowed     Spouse name: N/A   . Number of  children: N/A   . Years of education: N/A     Occupational History   . Not on file.  Social History Main Topics   . Smoking status: Never Smoker   . Smokeless tobacco: Never Used   . Alcohol use No   . Drug use: No   . Sexual activity: Not on file     Other Topics Concern   . Not on file     Social History Narrative   . No narrative on file       The following sections were reviewed this encounter by the provider:        Vitals:  BP 96/58   Pulse 74   Temp 97.5 F (36.4 C) (Oral)   Resp 15   Ht 1.524 m (5')   Wt 59.9 kg (132 lb)   SpO2 94%   BMI 25.78 kg/m     Review of Systems   Constitutional: Positive for malaise/fatigue and weight loss. Negative for chills and fever.   Respiratory: Negative for cough and shortness of breath.    Cardiovascular: Positive for chest pain. Negative for palpitations.        Occ CP from incision site   Gastrointestinal: Negative for abdominal pain, diarrhea, nausea and vomiting.   Musculoskeletal: Negative for back pain.   Neurological: Negative for headaches.     General Examination:   GENERAL APPEARANCE: alert, in no acute distress, well developed, well nourished, oriented to time, place, and person.   HEAD: normal appearance, atraumatic.   EYES: extraocular movement intact (EOMI), pupils equal, round, reactive to light and accommodation, sclera anicteric, conjunctiva clear.    ORAL CAVITY: normal oropharynx, normal lips, mucosa moist, no lesions.   THROAT: normal appearance, clear, no erythema.   NECK/THYROID: neck supple,  no cervical lymphadenopathy, no neck mass palpated, no thyromegaly.   LYMPH NODES: no palpable adenopathy.   SKIN: good turgor, no rashes, no suspicious lesions.   HEART: S1, S2 normal, no murmurs, rubs, gallops, regular rate and rhythm.   LUNGS: normal effort / no distress, normal breath sounds, clear to auscultation bilaterally, no wheezes, rales, rhonchi.   MUSCULOSKELETAL: full range of motion,  EXTREMITIES: right foot, mildly swollen, minimally  erythematous, diffusely tender, no cords, homans, pain in calf  PERIPHERAL PULSES: 1+ dorsalis pedis, 1+ posterior tibial.   NEUROLOGIC: nonfocal, cranial nerves 2-12 grossly intact,  normal strength and tone, sensory exam intact.   PSYCH: cognitive function intact, mood/affect full range, speech clear.    1. Essential hypertension  - CBC and differential  - Comprehensive metabolic panel  BP at goal, continue current meds    2. Pure hypercholesterolemia  Continue current meds    3. Right foot pain  - Uric acid  - US Venous Low Extrem Duplx Dopp Uni Right; Future  F/u dependent on above results  Pt to keep foot elevated.    John Giovanni, MD

## 2016-09-22 ENCOUNTER — Telehealth (INDEPENDENT_AMBULATORY_CARE_PROVIDER_SITE_OTHER): Payer: Self-pay | Admitting: Internal Medicine

## 2016-09-22 DIAGNOSIS — M109 Gout, unspecified: Secondary | ICD-10-CM

## 2016-09-22 MED ORDER — COLCHICINE 0.6 MG PO TABS
ORAL_TABLET | ORAL | 0 refills | Status: DC
Start: 2016-09-22 — End: 2016-10-02

## 2016-09-22 NOTE — Telephone Encounter (Signed)
Left message with lab results  Pt has gout, will give one dose of 0.3 mg colchicine and refer to podiatry.  Renal function worsening, pt more anemic  Advised pt importance of keeping colonoscopy appt.

## 2016-09-26 ENCOUNTER — Telehealth (INDEPENDENT_AMBULATORY_CARE_PROVIDER_SITE_OTHER): Payer: Self-pay | Admitting: Internal Medicine

## 2016-09-26 ENCOUNTER — Ambulatory Visit
Admission: RE | Admit: 2016-09-26 | Discharge: 2016-09-26 | Disposition: A | Discharge status: Home / Self Care | Payer: Medicare Other | Source: Ambulatory Visit | Attending: Internal Medicine | Admitting: Internal Medicine

## 2016-09-26 DIAGNOSIS — M79671 Pain in right foot: Secondary | ICD-10-CM | POA: Insufficient documentation

## 2016-09-26 NOTE — Telephone Encounter (Signed)
Called pt  Spoke with daughter  Pt had some relief with small dose of colchicine.  Daughter will stop by today to pick up referral to podiatrist  Discussed my concern over decrease in hematocrit.pt will be following up at iron infusion on Monday  Pt scheduled for colonoscopy next week  Has appt tomorrow for colorectal surgeon for hemorrhoids.

## 2016-09-29 MED ORDER — DARBEPOETIN ALFA 60 MCG/0.3ML IJ SOSY
60.0000 ug | PREFILLED_SYRINGE | Freq: Once | INTRAMUSCULAR | Status: AC
Start: 2016-10-02 — End: 2016-10-02
  Administered 2016-10-02: 60 ug via SUBCUTANEOUS

## 2016-10-02 ENCOUNTER — Ambulatory Visit: Payer: Medicare Other

## 2016-10-02 ENCOUNTER — Ambulatory Visit
Admission: RE | Admit: 2016-10-02 | Discharge: 2016-10-02 | Disposition: A | Discharge status: Home / Self Care | Payer: Medicare Other | Source: Ambulatory Visit | Attending: Nephrology | Admitting: Nephrology

## 2016-10-02 ENCOUNTER — Telehealth: Payer: Medicare Other

## 2016-10-02 VITALS — BP 159/74 | HR 75 | Temp 97.8°F

## 2016-10-02 DIAGNOSIS — N185 Chronic kidney disease, stage 5: Secondary | ICD-10-CM

## 2016-10-02 DIAGNOSIS — D649 Anemia, unspecified: Secondary | ICD-10-CM

## 2016-10-02 HISTORY — DX: Anemia, unspecified: D64.9

## 2016-10-02 LAB — CBC
Absolute NRBC: 0 10*3/uL
Hematocrit: 32.1 % — ABNORMAL LOW (ref 37.0–47.0)
Hgb: 9.7 g/dL — ABNORMAL LOW (ref 12.0–16.0)
MCH: 21 pg — ABNORMAL LOW (ref 28.0–32.0)
MCHC: 30.2 g/dL — ABNORMAL LOW (ref 32.0–36.0)
MCV: 69.6 fL — ABNORMAL LOW (ref 80.0–100.0)
MPV: 9.5 fL (ref 9.4–12.3)
Nucleated RBC: 0 /100 WBC (ref 0.0–1.0)
Platelets: 333 10*3/uL (ref 140–400)
RBC: 4.61 10*6/uL (ref 4.20–5.40)
RDW: 16 % — ABNORMAL HIGH (ref 12–15)
WBC: 6.22 10*3/uL (ref 3.50–10.80)

## 2016-10-02 LAB — FERRITIN: Ferritin: 342.32 ng/mL — ABNORMAL HIGH (ref 4.60–204.00)

## 2016-10-02 LAB — HEMOLYSIS INDEX: Hemolysis Index: 6 (ref 0–18)

## 2016-10-02 MED ORDER — DARBEPOETIN ALFA 60 MCG/0.3ML IJ SOSY
PREFILLED_SYRINGE | INTRAMUSCULAR | Status: AC
Start: 2016-10-02 — End: ?
  Filled 2016-10-02: qty 0.3

## 2016-10-02 NOTE — Progress Notes (Signed)
Jacqueline Weber is a 66 y.o. female here for labs and Aranesp injectio for hgb of 9.7  See MAR, Doc flowsheet, and VS.   Tolerated injection well. [name] RTC weekly then redraw on 10/30/16  Pt D/C'd ambulatory  [name], RN

## 2016-10-02 NOTE — Pre-Procedure Instructions (Addendum)
DOS: 10/04/16.  Procedure: 08:45am, Arrival: 07:15am. NPO per surgeon's instructions: NPO after midnight. See medication list for notes.    Cardiac clearance from [facility] dated 09/19/16 in EPIC. Contains note re: anticoagulant use. 09/19/16 EKG and 06/21/16 Echo results are also in EPIC.     09/21/16 CBC and CMP lab results in EPIC. Hgb 9.4 and Hct 31.1.  CBC was repeated on 10/02/16 - Hgb 9.7, Hct 32.1.      Interview completed with patient's daughter, [name]. Mother speaks [place]. Attempted to submit request for an interpreter - timeframe does not allow request to be submitted. Daughter aware an interpreter may not be available on DOS.     To anesthesia for consideration.

## 2016-10-03 NOTE — Anesthesia Preprocedure Evaluation (Addendum)
Anesthesia Evaluation    AIRWAY    Mallampati: II    TM distance: >3 FB  Neck ROM: full  Mouth Opening:full   CARDIOVASCULAR           DENTAL         PULMONARY         OTHER FINDINGS              Relevant Problems   No active problems are marked relevant to this note.               Anesthesia Plan    ASA 3     general               (59F  HTN, DM2, CAD with CABG in 2017, Hx of CVA, Renal insufficiency  EKG: NSR  Labs show anemia (H/H 9.7/32.1), Bun 37, Cr 2.8  Echo 05/2016: EF 65%  Cardiology clearance on chart: intermediate but acceptable risk)      Detailed anesthesia plan: general IV            informed consent obtained    Plan discussed with CRNA.    ECG reviewed               Signed by: Penne Lash 10/03/16 8:10 AM

## 2016-10-03 NOTE — Pre-Procedure Instructions (Signed)
Spoke with York Cerise / Pharmacist, hospital. Advised her of the submitted request for a "lao" language interpreter. Stated she was not sure if they would be able to supply an interpreter for that language, but she would look out for the request.

## 2016-10-04 ENCOUNTER — Ambulatory Visit: Payer: Medicare Other | Admitting: Anesthesiology

## 2016-10-04 ENCOUNTER — Encounter: Payer: Self-pay | Admitting: Gastroenterology

## 2016-10-04 ENCOUNTER — Ambulatory Visit: Payer: Self-pay

## 2016-10-04 ENCOUNTER — Encounter
Admission: RE | Disposition: A | Discharge status: Home / Self Care | Payer: Self-pay | Source: Ambulatory Visit | Attending: Gastroenterology

## 2016-10-04 ENCOUNTER — Ambulatory Visit
Admission: RE | Admit: 2016-10-04 | Discharge: 2016-10-04 | Disposition: A | Discharge status: Home / Self Care | Payer: Medicare Other | Source: Ambulatory Visit | Attending: Gastroenterology | Admitting: Gastroenterology

## 2016-10-04 ENCOUNTER — Ambulatory Visit: Payer: Medicare Other | Admitting: Gastroenterology

## 2016-10-04 DIAGNOSIS — D123 Benign neoplasm of transverse colon: Secondary | ICD-10-CM | POA: Insufficient documentation

## 2016-10-04 DIAGNOSIS — I2510 Atherosclerotic heart disease of native coronary artery without angina pectoris: Secondary | ICD-10-CM | POA: Insufficient documentation

## 2016-10-04 DIAGNOSIS — K3189 Other diseases of stomach and duodenum: Secondary | ICD-10-CM | POA: Insufficient documentation

## 2016-10-04 DIAGNOSIS — K635 Polyp of colon: Secondary | ICD-10-CM

## 2016-10-04 DIAGNOSIS — K259 Gastric ulcer, unspecified as acute or chronic, without hemorrhage or perforation: Secondary | ICD-10-CM | POA: Insufficient documentation

## 2016-10-04 DIAGNOSIS — E7800 Pure hypercholesterolemia, unspecified: Secondary | ICD-10-CM | POA: Insufficient documentation

## 2016-10-04 DIAGNOSIS — I129 Hypertensive chronic kidney disease with stage 1 through stage 4 chronic kidney disease, or unspecified chronic kidney disease: Secondary | ICD-10-CM | POA: Insufficient documentation

## 2016-10-04 DIAGNOSIS — K642 Third degree hemorrhoids: Secondary | ICD-10-CM | POA: Insufficient documentation

## 2016-10-04 DIAGNOSIS — K2980 Duodenitis without bleeding: Secondary | ICD-10-CM | POA: Insufficient documentation

## 2016-10-04 DIAGNOSIS — Z951 Presence of aortocoronary bypass graft: Secondary | ICD-10-CM | POA: Insufficient documentation

## 2016-10-04 DIAGNOSIS — N189 Chronic kidney disease, unspecified: Secondary | ICD-10-CM | POA: Insufficient documentation

## 2016-10-04 DIAGNOSIS — K5900 Constipation, unspecified: Secondary | ICD-10-CM | POA: Insufficient documentation

## 2016-10-04 DIAGNOSIS — D509 Iron deficiency anemia, unspecified: Secondary | ICD-10-CM | POA: Insufficient documentation

## 2016-10-04 DIAGNOSIS — E1122 Type 2 diabetes mellitus with diabetic chronic kidney disease: Secondary | ICD-10-CM | POA: Insufficient documentation

## 2016-10-04 DIAGNOSIS — Z7982 Long term (current) use of aspirin: Secondary | ICD-10-CM | POA: Insufficient documentation

## 2016-10-04 DIAGNOSIS — I69354 Hemiplegia and hemiparesis following cerebral infarction affecting left non-dominant side: Secondary | ICD-10-CM | POA: Insufficient documentation

## 2016-10-04 DIAGNOSIS — K449 Diaphragmatic hernia without obstruction or gangrene: Secondary | ICD-10-CM | POA: Insufficient documentation

## 2016-10-04 DIAGNOSIS — K921 Melena: Secondary | ICD-10-CM | POA: Insufficient documentation

## 2016-10-04 DIAGNOSIS — K5730 Diverticulosis of large intestine without perforation or abscess without bleeding: Secondary | ICD-10-CM | POA: Insufficient documentation

## 2016-10-04 DIAGNOSIS — I252 Old myocardial infarction: Secondary | ICD-10-CM | POA: Insufficient documentation

## 2016-10-04 HISTORY — DX: Disorder of kidney and ureter, unspecified: N28.9

## 2016-10-04 HISTORY — DX: Constipation, unspecified: K59.00

## 2016-10-04 HISTORY — PX: EGD, COLONOSCOPY: SHX3799

## 2016-10-04 LAB — GLUCOSE WHOLE BLOOD - POCT: Whole Blood Glucose POCT: 170 mg/dL — ABNORMAL HIGH (ref 70–100)

## 2016-10-04 SURGERY — EGD, COLONOSCOPY
Anesthesia: Anesthesia General

## 2016-10-04 MED ORDER — FENTANYL CITRATE (PF) 50 MCG/ML IJ SOLN (WRAP)
INTRAMUSCULAR | Status: AC
Start: 2016-10-04 — End: ?
  Filled 2016-10-04: qty 2

## 2016-10-04 MED ORDER — PROPOFOL 10 MG/ML IV EMUL (WRAP)
INTRAVENOUS | Status: AC
Start: 2016-10-04 — End: ?
  Filled 2016-10-04: qty 20

## 2016-10-04 MED ORDER — LIDOCAINE HCL (PF) 2 % IJ SOLN
INTRAMUSCULAR | Status: AC
Start: 2016-10-04 — End: ?
  Filled 2016-10-04: qty 5

## 2016-10-04 MED ORDER — ONDANSETRON HCL 4 MG/2ML IJ SOLN
INTRAMUSCULAR | Status: DC | PRN
Start: 2016-10-04 — End: 2016-10-04
  Administered 2016-10-04: 4 mg via INTRAVENOUS

## 2016-10-04 MED ORDER — LIDOCAINE HCL 2 % IJ SOLN
INTRAMUSCULAR | Status: DC | PRN
Start: 2016-10-04 — End: 2016-10-04
  Administered 2016-10-04: 80 mg via INTRAVENOUS

## 2016-10-04 MED ORDER — LIDOCAINE 1% BUFFERED - CNR/OUTSOURCED
0.3000 mL | Freq: Once | INTRAMUSCULAR | Status: DC
Start: 2016-10-04 — End: 2016-10-04

## 2016-10-04 MED ORDER — PROPOFOL INFUSION 10 MG/ML
INTRAVENOUS | Status: DC | PRN
Start: 2016-10-04 — End: 2016-10-04
  Administered 2016-10-04: 50 ug/kg/min via INTRAVENOUS

## 2016-10-04 MED ORDER — LACTATED RINGERS IV SOLN
1000.0000 mL | INTRAVENOUS | Status: DC
Start: 2016-10-04 — End: 2016-10-04

## 2016-10-04 MED ORDER — FENTANYL CITRATE (PF) 50 MCG/ML IJ SOLN (WRAP)
INTRAMUSCULAR | Status: DC | PRN
Start: 2016-10-04 — End: 2016-10-04
  Administered 2016-10-04: 25 ug via INTRAVENOUS

## 2016-10-04 MED ORDER — PROPOFOL INFUSION 10 MG/ML
INTRAVENOUS | Status: DC | PRN
Start: 2016-10-04 — End: 2016-10-04
  Administered 2016-10-04: 50 mg via INTRAVENOUS

## 2016-10-04 MED ORDER — CLONIDINE HCL 0.1 MG PO TABS
0.2000 mg | ORAL_TABLET | Freq: Two times a day (BID) | ORAL | Status: DC
Start: 2016-10-04 — End: 2016-10-04

## 2016-10-04 MED ORDER — ONDANSETRON HCL 4 MG/2ML IJ SOLN
INTRAMUSCULAR | Status: AC
Start: 2016-10-04 — End: ?
  Filled 2016-10-04: qty 2

## 2016-10-04 MED ORDER — CLONIDINE HCL 0.1 MG PO TABS
ORAL_TABLET | ORAL | Status: AC
Start: 2016-10-04 — End: 2016-10-04
  Administered 2016-10-04: 0.2 mg via ORAL
  Filled 2016-10-04: qty 2

## 2016-10-04 SURGICAL SUPPLY — 57 items
BASIN EME PLS 700ML LF GRAD TRNLU PGMNT (Patient Supply) ×2
BASIN EMESIS 700 ML GRADUATED TRANSLUCENT PIGMENT FREE PLASTIC (Patient Supply) ×1 IMPLANT
BASIN EMESIS LF (Patient Supply) ×2
BLOCK BITE OD60 FR ADULT STRAP FLEXIBLE (Endoscopic Supplies) ×1
BLOCK BITE OD60 FR ADULT STRAP FLEXIBLE SIDEHOLE (Endoscopic Supplies) ×1 IMPLANT
CANNISTER SUCTION 1500CC (Suction)
CONTAINER HISTOLOGY 60 ML 30 ML GRADUATE LEAK RESISTANT O RING PREFILL (Procedure Accessories) IMPLANT
FORCEP HOT BIOPSY RJ4 (Endoscopic Supplies)
FORCEPS BIOPSY L240 CM +2.8 MM HOT OD2.2 (Endoscopic Supplies)
FORCEPS BIOPSY L240 CM +2.8 MM HOT OD2.2 MM RADIAL JAW (Endoscopic Supplies) IMPLANT
FORCEPS BIOPSY L240 CM JUMBO MICROMESH (Instrument)
FORCEPS BIOPSY L240 CM JUMBO MICROMESH TEETH STREAMLINE CATHETER (Instrument) IMPLANT
FORCEPS BIOPSY L240 CM LARGE CAPACITY (Instrument)
FORCEPS BIOPSY L240 CM MICROMESH TEETH STREAMLINE CATHETER NEEDLE (Instrument) IMPLANT
FORCEPS BIOPSY L240 CM STANDARD CAPACITY (Instrument) ×1
FORCEPS JAW RADIAL JUMBO (Instrument)
FORCEPS RAD JAW 4 BIOSPY W/NDL (Instrument)
FORCEPS RADIAL JAW 3 W/ NEEDLE (Endoscopic Supplies) IMPLANT
FORCEPS RADIAL JAW 4 2.8MM (Instrument) ×1
GOWN ISO YELLOW UNIVERSAL (Gown) IMPLANT
GOWN ISOLATION XL HOOK LOOP NECK KNIT (Patient Supply) ×2 IMPLANT
GOWN XL - NON STERILE (Patient Supply) ×2
KIT ENDOSCOPIC 2 BUNDLE COMPLIANCE (Endoscopic Supplies) ×1
KIT ENDOSCOPIC 2 BUNDLE COMPLIANCE ENDOKIT 2 OZ (Endoscopic Supplies) ×1 IMPLANT
KIT GI CUSTOM ILH (Endoscopic Supplies) ×1
KIT SMARTPREP APC 30 30ML (Kits) IMPLANT
LINER SCT 1500CC THNWL MDVC FLX ADV LF (Suction) IMPLANT
MARKER ENDOSCOPIC PERMANENT INDICATION (Syringes, Needles)
MARKER ENDOSCOPIC PERMANENT INDICATION DARK SYRINGE SPOT EX 5 ML (Syringes, Needles) IMPLANT
MOUTHPIECE BITEBLOCK ADULT (Endoscopic Supplies) ×1
NEEDLE INJC DISP NM LOWER GI (Needles) IMPLANT
NET SPECIMEN RETRIEVAL L230 CM STANDARD (Urology Supply)
NET SPECIMEN RETRIEVAL L230 CM STANDARD SHEATH OD2.5 MM L6 CM X W3 CM (Urology Supply) IMPLANT
PAD ELECTROSRG GRND REM W CRD (Procedure Accessories) ×1 IMPLANT
RETRIEVER ROTH NET STD 2.5MM (Urology Supply)
SNARE CAPTIFLEX 27MM (Endoscopic Supplies)
SNARE CAPTIVATOR 13MMX240CM (GE Lab Supplies) ×1
SNARE ESCP MIC CPTVTR 13MM 240IN STRL (GE Lab Supplies) ×1
SNARE MD OVAL 240CM 2.4 MM CPTFLX LP FLXBL ENDOSCOPIC POLYPECTOMY 27MM (Endoscopic Supplies) IMPLANT
SNARE MEDIUM OVAL L240 CM OD2.4 MM (Endoscopic Supplies)
SNARE SMALL HEXAGON CAPTIVATOR STIFF ENDOSCOPIC POLYPECTOMY (GE Lab Supplies) IMPLANT
SOL FORMALIN 10% PREFILL 30ML (Procedure Accessories)
SPEEDBAND SUPERVIEW SUPER (Endoscopic Supplies) IMPLANT
SPONGE GAUZE L4 IN X W4 IN 4 PLY HIGH (Sponge)
SPONGE GAUZE L4 IN X W4 IN 4 PLY NONWOVEN LINT FREE CURITY RAYON (Sponge) IMPLANT
SPONGE GAUZE VERSA 4PLY 4X4 (Sponge)
SYRING SPOT EX ENDO MARK 5CC (Syringes, Needles)
SYRINGE 50 ML GRADUATE NONPYROGENIC DEHP (Syringes, Needles) ×1
SYRINGE 50 ML GRADUATE NONPYROGENIC DEHP FREE PVC FREE BD MEDICAL (Syringes, Needles) IMPLANT
SYRINGE SLIP-TIP 60CC (Syringes, Needles) ×1
TRAP INLINE SUCTION CHAMBER FLAT SURFACE (Endoscopic Supplies) ×1
TRAP INLINE SUCTION CHAMBER FLAT SURFACE QUICK CATCH SPECIMEN JAR (Endoscopic Supplies) IMPLANT
TRAP QUICK CATCH IN-LINE (Endoscopic Supplies) ×1
TRAP SPECIMEN LF (Procedure Accessories) IMPLANT
VALVE AIR/WATER DEFENDO KIT SUCTION (Suction) ×2
VALVE AIR/WATER DEFENDO KIT SUCTION BUTTON BIOPSY (Suction) ×2 IMPLANT
VALVE DEFENDO SUCTION AIR/WTR (Suction) ×2

## 2016-10-04 NOTE — Transfer of Care (Signed)
Anesthesia Transfer of Care Note    Patient: Stepahnie Campo    Procedures performed: Procedure(s):  EGD w/ bx, COLONOSCOPY w/ bx, polypectomy    Anesthesia type: General TIVA    Patient location:Phase II PACU    Last vitals:   Vitals:    10/04/16 1019   BP: 117/58   Pulse: 72   Resp: 16   Temp: 36.9 C (98.4 F)   SpO2: 97%       Post pain: Patient not complaining of pain, continue current therapy      Mental Status:awake and alert     Respiratory Function: tolerating room air    Cardiovascular: stable    Nausea/Vomiting: patient not complaining of nausea or vomiting    Hydration Status: adequate    Post assessment: no apparent anesthetic complications    Signed by: Norlene Campbell  10/04/16 10:20 AM

## 2016-10-04 NOTE — Anesthesia Postprocedure Evaluation (Signed)
Anesthesia Post Evaluation    Patient: Jacqueline Weber    Procedure(s):  EGD w/ bx, COLONOSCOPY w/ bx, polypectomy    Anesthesia type: General TIVA    Last Vitals:   Vitals:    10/04/16 1019   BP: 117/58   Pulse: 72   Resp: 16   Temp: 36.9 C (98.4 F)   SpO2: 97%       Not complete    Anesthesia Qualified Clinical Data Registry 2018    PACU Reintubation  Did the Patient have general anesthesia with intubation: No        PONV Adult  Is the patient aged 64 or older: Yes  Did the patient receive recieve a general anesthestic: Yes  Does the patient have 3 or more risk factors for PONV? No        PONV Pediatric  Is the patient aged 53-17? No            PACU Transfer Checklist Protocol  Was the patient transferred to the PACU at the conclusion of surgery? Yes  Was a checklist or transfer protocol used? Yes    ICU Transfer Checklist Protocol  Was the patient transferred to the ICU at the conclusion of surgery? No      Post-op Pain Assessment Prior to Anesthesia Care End  Age >=18 and assessed for pain in PACU: Yes  Pacu pain score <7/10: Yes      Perioperative Mortality  Perioperative mortality prior to Anesthesia end time: No    Perioperative Cardiac Arrest  Did the patient have an unanticipated intraoperative cardiac arrest between anesthesia start time and anesthesia end time? No    Unplanned Admission to ICU  Did the patient have an unplanned admission to the ICU (not initially anticipated at anesthesia start time)? No      Signed by: Norlene Campbell, 10/04/2016 10:20 AM

## 2016-10-04 NOTE — PACU (Signed)
Patient states readiness to be discharged to home. Patient states pain and nausea managed at this time. Patient and family verbalize understanding of discharge instructions, medications, plan of care for pain/nausea management, and activity at home and awareness of available resources to contact from home as necessary.   Plan to discharge to home with all belongings and instructions.

## 2016-10-04 NOTE — Progress Notes (Signed)
Pt with BP 218/145, repeated recheck and obtained similar numbers.  Dr. Kenton Kingfisher (anesthesia) and Dr. Janalyn Rouse aware.  Per anesthesia pt given meds she should have taken today.  Continue to monitor pt, family and interpretor at side.  Pt without complaints.  Meds given by RN with a sip of water, will recheck BP in 30 min.  Pt with call light in reach.

## 2016-10-04 NOTE — PACU (Signed)
Daughter and interpretor at bedside

## 2016-10-04 NOTE — Op Note (Addendum)
GI attending:  S/p EGD/colonoscopy, findings:  - gastritis, bx  - hiatal hernia  - antral erosions  - duodenitis in 2nd portion of duodenum  - mild erythema in the cecum, bx  - normal TI  - scaterred diverticulosis throughout the colon  - transverse colon polyp, snare cautery polypectomy  - diminutive cecal polyp, bx polypectomy  - large grade III-IV internal hemorrhoids, ooze on contact  Recs:  - await pathology results  - Daily PPI  - avoid NSAIDs, except for aspiring  - metamucil 2 TBS in a glass of water daily  - Anusol HC 2.5% twice a day for 2 weeks  - referral to surgery for hemorrhoids management  - Colonoscopy in 5 years    Dr. Janalyn Rouse

## 2016-10-04 NOTE — Anesthesia Postprocedure Evaluation (Signed)
Anesthesia Post Evaluation    Patient: Jacqueline Weber    Procedure(s):  EGD w/ bx, COLONOSCOPY w/ bx, polypectomy    Anesthesia type: General TIVA    Last Vitals:   Vitals:    10/04/16 1040   BP: 154/73   Pulse:    Resp: 18   Temp:    SpO2: 93%       Patient Location: Phase II PACU      Post Pain: Patient not complaining of pain, continue current therapy    Mental Status: awake    Respiratory Function: tolerating room air    Cardiovascular: stable    Nausea/Vomiting: patient not complaining of nausea or vomiting    Hydration Status: adequate    Post Assessment: no apparent anesthetic complications and no reportable events          Not Complete    Signed by: Valere Dross, 10/04/2016 10:59 AM

## 2016-10-04 NOTE — Discharge Instr - AVS First Page (Addendum)
Reason for your Hospital Admission:  EGD w/ bx, COLONOSCOPY w/ bx, polypectomy       Instructions for after your discharge:    Endoscopy Discharge Instructions  General Instructions:  1. Following sedation, your judgement, perception, and coordination are considered impaired. Even though you may feel awake and alert, you are considered legally intoxicated. Therefore, until the next morning;   Do not Drive   Do not operate appliances or equipment that requires reaction time (e.g. stove, electrical tools, machinery)   Do not sign legal documents or be involved in important decisions.   Do not smoke if alone   Do not drink alcoholic beverages   Go directly home and rest for several hours before resuming your routine activities.   It is highly recommended to have a responsible adult stay with you for the next 24 hours    2. Tenderness, swelling or pain may occur at the IV site where you received sedation. If you experience this, apply warm soaks to the area. Notify your physician if this persists.    Instructions Specific To Procedures - Report To Physician Any Of The Following:      1. Severe and persistent abdominal pain/bloating which does not subside within   2-3 hours   2. Large amount of rectal bleeding (some mucosal blood streaking may occur,   especially if biopsy or polypectomy was done or if hemorrhoids are present.   3. Nausea/vomitting   4. Fevers/Chills within 24 hours after procedure. Temp>101deg F     ******In Addition:   If polyp has been removed, DO NOT take aspirin or aspirin containing products   (e.g. Anacin, Alka Seltzer, Bufferin, Etc.) or non-steroidal anti-inflammatory drugs   (e.g. Advil, Motrin, etc.) for *** days unless otherwise advised by doctor. Tylenol   or extra Strength Tylenol is permitted.                You may resume your daily Aspirin tomorrow Feb 1 per Dr Janalyn Rouse      Additional Discharge Instructions  Your diet after the procedure: regular as toloerated.      If you have  questions or problems contact your MD immediately. If you need immediate attention, call your MD, 911 and/or go to nearest emergency room.

## 2016-10-04 NOTE — H&P (Signed)
GI PRE PROCEDURE NOTE    Proceduralist Comments:   Review of Systems and Past Medical / Surgical History performed: Yes     Indications:hematochezia, melena, iron deficiency anemia    Previous Adverse Reaction to Anesthesia or Sedation (if yes, describe): No    Physical Exam / Laboratory Data (If applicable)   Airway Classification: Class III    General: Alert and cooperative  Lungs: Lungs clear to auscultation  Cardiac: RRR, normal S1S2.    Abdomen: Soft, non tender. Normal active bowel sounds  Other:     Recent CBC No results for input(s): HGB, HCT, PLT in the last 24 hours.    Invalid input(s): WBCIR    Planned Sedation:   Deep sedation with anesthesia    Attestation:   Brianny Soulliere has been reassessed immediately prior to the procedure and is an appropriate candidate for the planned sedation and procedure. Risks, benefits and alternatives to the planned procedure and sedation have been explained to the patient or guardian:  yes    Signed by: Norton Blizzard

## 2016-10-06 LAB — LAB USE ONLY - HISTORICAL SURGICAL PATHOLOGY

## 2016-10-06 MED ORDER — DARBEPOETIN ALFA 60 MCG/0.3ML IJ SOSY
60.0000 ug | PREFILLED_SYRINGE | Freq: Once | INTRAMUSCULAR | Status: AC
Start: 2016-10-09 — End: 2016-10-09
  Administered 2016-10-09: 60 ug via SUBCUTANEOUS

## 2016-10-09 ENCOUNTER — Ambulatory Visit: Payer: Medicare Other | Attending: Nephrology

## 2016-10-09 VITALS — BP 125/64 | HR 69 | Temp 97.9°F | Resp 17

## 2016-10-09 DIAGNOSIS — D631 Anemia in chronic kidney disease: Secondary | ICD-10-CM | POA: Insufficient documentation

## 2016-10-09 DIAGNOSIS — N185 Chronic kidney disease, stage 5: Secondary | ICD-10-CM | POA: Insufficient documentation

## 2016-10-09 MED ORDER — DARBEPOETIN ALFA 60 MCG/0.3ML IJ SOSY
PREFILLED_SYRINGE | INTRAMUSCULAR | Status: AC
Start: 2016-10-09 — End: ?
  Filled 2016-10-09: qty 0.3

## 2016-10-09 NOTE — Progress Notes (Signed)
Time: [place] is a 66 y.o. female here for Aranesp injection.  Offers no c/o's pain.  Hgb is 9.7 and Hct is 32.1 on 10/02/16.  Aranesp 60 mcg given SQ into LUA without incident.  Please see MAR, VS for more details.  PT discharged stable, NAD.  RTC 10/16/16  Mardelle Matte, RN 2:00 PM

## 2016-10-13 MED ORDER — DARBEPOETIN ALFA 60 MCG/0.3ML IJ SOSY
60.0000 ug | PREFILLED_SYRINGE | Freq: Once | INTRAMUSCULAR | Status: AC
Start: 2016-10-16 — End: 2016-10-16
  Administered 2016-10-16: 60 ug via SUBCUTANEOUS

## 2016-10-16 ENCOUNTER — Ambulatory Visit
Admission: RE | Admit: 2016-10-16 | Discharge: 2016-10-16 | Disposition: A | Discharge status: Home / Self Care | Payer: Medicare Other | Source: Ambulatory Visit | Attending: Nephrology | Admitting: Nephrology

## 2016-10-16 ENCOUNTER — Ambulatory Visit: Payer: Medicare Other

## 2016-10-16 DIAGNOSIS — D631 Anemia in chronic kidney disease: Secondary | ICD-10-CM | POA: Insufficient documentation

## 2016-10-16 DIAGNOSIS — N185 Chronic kidney disease, stage 5: Secondary | ICD-10-CM

## 2016-10-16 DIAGNOSIS — E1122 Type 2 diabetes mellitus with diabetic chronic kidney disease: Secondary | ICD-10-CM | POA: Insufficient documentation

## 2016-10-16 DIAGNOSIS — I1310 Hypertensive heart and chronic kidney disease without heart failure, with stage 1 through stage 4 chronic kidney disease, or unspecified chronic kidney disease: Secondary | ICD-10-CM | POA: Insufficient documentation

## 2016-10-16 LAB — LIPID PANEL
Cholesterol / HDL Ratio: 2.9
Cholesterol: 146 mg/dL (ref 0–199)
HDL: 50 mg/dL (ref 40–9999)
LDL Calculated: 83 mg/dL (ref 0–99)
Triglycerides: 65 mg/dL (ref 34–149)
VLDL Calculated: 13 mg/dL (ref 10–40)

## 2016-10-16 LAB — CBC AND DIFFERENTIAL
Absolute NRBC: 0 10*3/uL
Basophils Absolute Automated: 0.03 10*3/uL (ref 0.00–0.20)
Basophils Automated: 0.4 %
Eosinophils Absolute Automated: 0.29 10*3/uL (ref 0.00–0.70)
Eosinophils Automated: 3.9 %
Hematocrit: 29.8 % — ABNORMAL LOW (ref 37.0–47.0)
Hgb: 9.1 g/dL — ABNORMAL LOW (ref 12.0–16.0)
Immature Granulocytes Absolute: 0.01 10*3/uL
Immature Granulocytes: 0.1 %
Lymphocytes Absolute Automated: 1.14 10*3/uL (ref 0.50–4.40)
Lymphocytes Automated: 15.5 %
MCH: 21.5 pg — ABNORMAL LOW (ref 28.0–32.0)
MCHC: 30.5 g/dL — ABNORMAL LOW (ref 32.0–36.0)
MCV: 70.4 fL — ABNORMAL LOW (ref 80.0–100.0)
MPV: 10.2 fL (ref 9.4–12.3)
Monocytes Absolute Automated: 0.5 10*3/uL (ref 0.00–1.20)
Monocytes: 6.8 %
Neutrophils Absolute: 5.39 10*3/uL (ref 1.80–8.10)
Neutrophils: 73.3 %
Nucleated RBC: 0 /100 WBC (ref 0.0–1.0)
Platelets: 199 10*3/uL (ref 140–400)
RBC: 4.23 10*6/uL (ref 4.20–5.40)
RDW: 16 % — ABNORMAL HIGH (ref 12–15)
WBC: 7.36 10*3/uL (ref 3.50–10.80)

## 2016-10-16 LAB — RENAL FUNCTION PANEL
Albumin: 2.1 g/dL — ABNORMAL LOW (ref 3.5–5.0)
Anion Gap: 7 (ref 5.0–15.0)
BUN: 34 mg/dL — ABNORMAL HIGH (ref 7.0–19.0)
CO2: 22 mEq/L (ref 22–29)
Calcium: 8.4 mg/dL — ABNORMAL LOW (ref 8.5–10.5)
Chloride: 106 mEq/L (ref 100–111)
Creatinine: 2.6 mg/dL — ABNORMAL HIGH (ref 0.6–1.0)
Glucose: 184 mg/dL — ABNORMAL HIGH (ref 70–100)
Phosphorus: 4.4 mg/dL (ref 2.3–4.7)
Potassium: 5.2 mEq/L — ABNORMAL HIGH (ref 3.5–5.1)
Sodium: 135 mEq/L — ABNORMAL LOW (ref 136–145)

## 2016-10-16 LAB — PROTEIN / CREATININE RATIO, URINE
Urine Creatinine, Random: 55.7 mg/dL
Urine Protein Random: 505 mg/dL — ABNORMAL HIGH (ref 1.0–14.0)
Urine Protein/Creatinine Ratio: 9.1

## 2016-10-16 LAB — HEMOLYSIS INDEX: Hemolysis Index: 3 (ref 0–18)

## 2016-10-16 LAB — HEMOGLOBIN A1C
Average Estimated Glucose: 128.4 mg/dL
Hemoglobin A1C: 6.1 % — ABNORMAL HIGH (ref 4.6–5.9)

## 2016-10-16 LAB — IRON PROFILE
Iron Saturation: 12 % — ABNORMAL LOW (ref 15–50)
Iron: 19 ug/dL — ABNORMAL LOW (ref 40–145)
TIBC: 157 ug/dL — ABNORMAL LOW (ref 265–497)
UIBC: 138 ug/dL (ref 126–382)

## 2016-10-16 LAB — FERRITIN: Ferritin: 137.22 ng/mL (ref 4.60–204.00)

## 2016-10-16 LAB — GFR: EGFR: 18.5

## 2016-10-16 MED ORDER — DARBEPOETIN ALFA 60 MCG/0.3ML IJ SOSY
PREFILLED_SYRINGE | INTRAMUSCULAR | Status: AC
Start: 2016-10-16 — End: ?
  Filled 2016-10-16: qty 0.3

## 2016-10-16 NOTE — Progress Notes (Signed)
Pt received injection to right deltoid region without issue; RTC one week. [name], RN     Pt's daughter stated that her mother will have labs drawn today for Cardiology appt and wondering if these labs can be substituted for labs due to be drawn in two weeks per her monthly labdraw for her CKD injection of Aranesp.  Nurse informed patient that we have orders to treat her for four weeks ending on 10/30/16 based on the 10/02/16 labs so we will finish out this month"s weekly round based on those labs and she should have her CKD labs drawn as scheduled 2/26 a.m. for next month prior to appt here in [place], RN

## 2016-10-20 ENCOUNTER — Telehealth (INDEPENDENT_AMBULATORY_CARE_PROVIDER_SITE_OTHER): Payer: Self-pay | Admitting: Internal Medicine

## 2016-10-20 NOTE — Telephone Encounter (Signed)
Judson Roch from [facility] called to ask for an order and a plan of care to be faxed- Can you please call her back [phone]- fax [phone]

## 2016-10-20 NOTE — Telephone Encounter (Signed)
Please call [facility].  They usually send [place] a fax with on order and care plan for me to sign.

## 2016-10-23 ENCOUNTER — Ambulatory Visit: Payer: Medicare Other

## 2016-10-23 VITALS — BP 108/57 | HR 68 | Temp 97.9°F | Resp 18

## 2016-10-23 DIAGNOSIS — N185 Chronic kidney disease, stage 5: Secondary | ICD-10-CM

## 2016-10-23 MED ORDER — DARBEPOETIN ALFA 60 MCG/0.3ML IJ SOSY
PREFILLED_SYRINGE | INTRAMUSCULAR | Status: AC
Start: 2016-10-23 — End: ?
  Filled 2016-10-23: qty 0.3

## 2016-10-23 MED ORDER — DARBEPOETIN ALFA 60 MCG/0.3ML IJ SOSY
60.0000 ug | PREFILLED_SYRINGE | Freq: Once | INTRAMUSCULAR | Status: AC
Start: 2016-10-23 — End: 2016-10-23
  Administered 2016-10-23: 60 ug via SUBCUTANEOUS

## 2016-10-23 NOTE — Progress Notes (Signed)
Time: [place] is a 66 y.o. female here for Aranesp injection.  Offers no c/o's.  Hgb is 9.1  on 10/16/16.  Aranesp 60 mcg given SQ into LUA without incident.  Please see MAR, VS for more details.  [id number], pt discharged stable, NAD.  RTC 10/30/16    Shela Commons, RN

## 2016-10-24 NOTE — Telephone Encounter (Signed)
They will fax over orders to be signed to [phone] today.

## 2016-10-27 ENCOUNTER — Encounter (INDEPENDENT_AMBULATORY_CARE_PROVIDER_SITE_OTHER): Payer: Self-pay | Admitting: Internal Medicine

## 2016-10-27 MED ORDER — DARBEPOETIN ALFA 60 MCG/0.3ML IJ SOSY
60.0000 ug | PREFILLED_SYRINGE | Freq: Once | INTRAMUSCULAR | Status: AC
Start: 2016-10-30 — End: 2016-10-30
  Administered 2016-10-30: 60 ug via SUBCUTANEOUS

## 2016-10-30 ENCOUNTER — Ambulatory Visit: Payer: Medicare Other

## 2016-10-30 DIAGNOSIS — N185 Chronic kidney disease, stage 5: Secondary | ICD-10-CM

## 2016-10-30 MED ORDER — DARBEPOETIN ALFA 60 MCG/0.3ML IJ SOSY
PREFILLED_SYRINGE | INTRAMUSCULAR | Status: AC
Start: 2016-10-30 — End: ?
  Filled 2016-10-30: qty 0.3

## 2016-10-30 NOTE — Progress Notes (Signed)
Jacqueline Weber is a 66 y.o. female here for Aranesp injection and received to her right deltoid without issue   See MAR, Doc flowsheet, and VS.   Tolerated injection well, RTC weekly for each injection as needed per parameters  Pt D/C'd stable with family member [name]  [name], RN

## 2016-11-03 MED ORDER — DARBEPOETIN ALFA 60 MCG/0.3ML IJ SOSY
60.0000 ug | PREFILLED_SYRINGE | Freq: Once | INTRAMUSCULAR | Status: AC
Start: 2016-11-06 — End: 2016-11-06
  Administered 2016-11-06: 60 ug via SUBCUTANEOUS

## 2016-11-06 ENCOUNTER — Ambulatory Visit: Payer: Medicare Other

## 2016-11-06 VITALS — BP 153/70 | HR 79 | Temp 97.1°F | Resp 18

## 2016-11-06 DIAGNOSIS — N185 Chronic kidney disease, stage 5: Secondary | ICD-10-CM

## 2016-11-06 DIAGNOSIS — D631 Anemia in chronic kidney disease: Secondary | ICD-10-CM | POA: Insufficient documentation

## 2016-11-06 MED ORDER — DARBEPOETIN ALFA 60 MCG/0.3ML IJ SOSY
PREFILLED_SYRINGE | INTRAMUSCULAR | Status: AC
Start: 2016-11-06 — End: ?
  Filled 2016-11-06: qty 0.3

## 2016-11-06 NOTE — Progress Notes (Signed)
Time: [place] is a 66 y.o. female here for Aranesp injection.  Offers none c/o's.    Aranesp 60 mcg given SQ into lua without incident.  Please see MAR, VS for more details.  [id number], pt discharged stable, NAD.  RTC 11/13/16    Shela Commons, RN

## 2016-11-10 MED ORDER — DARBEPOETIN ALFA 60 MCG/0.3ML IJ SOSY
60.0000 ug | PREFILLED_SYRINGE | Freq: Once | INTRAMUSCULAR | Status: DC
Start: 2016-11-13 — End: 2016-11-13

## 2016-11-13 ENCOUNTER — Ambulatory Visit: Payer: Medicare Other

## 2016-11-13 ENCOUNTER — Ambulatory Visit
Admission: RE | Admit: 2016-11-13 | Discharge: 2016-11-13 | Disposition: A | Discharge status: Home / Self Care | Payer: Medicare Other | Source: Ambulatory Visit | Attending: Nephrology | Admitting: Nephrology

## 2016-11-13 DIAGNOSIS — N185 Chronic kidney disease, stage 5: Secondary | ICD-10-CM

## 2016-11-13 LAB — CBC
Absolute NRBC: 0 10*3/uL
Hematocrit: 39.4 % (ref 37.0–47.0)
Hgb: 11.8 g/dL — ABNORMAL LOW (ref 12.0–16.0)
MCH: 20.7 pg — ABNORMAL LOW (ref 28.0–32.0)
MCHC: 29.9 g/dL — ABNORMAL LOW (ref 32.0–36.0)
MCV: 69.1 fL — ABNORMAL LOW (ref 80.0–100.0)
MPV: 9.3 fL — ABNORMAL LOW (ref 9.4–12.3)
Nucleated RBC: 0 /100 WBC (ref 0.0–1.0)
Platelets: 281 10*3/uL (ref 140–400)
RBC: 5.7 10*6/uL — ABNORMAL HIGH (ref 4.20–5.40)
RDW: 19 % — ABNORMAL HIGH (ref 12–15)
WBC: 5.97 10*3/uL (ref 3.50–10.80)

## 2016-11-13 LAB — FERRITIN: Ferritin: 82.27 ng/mL (ref 4.60–204.00)

## 2016-11-13 LAB — HEMOLYSIS INDEX: Hemolysis Index: 6 (ref 0–18)

## 2016-11-13 NOTE — Progress Notes (Signed)
Pt was arrived at clinic; labs evaluated and not in need of the injection today. Will return in one month per parameters.  [name], RN

## 2016-12-08 MED ORDER — DARBEPOETIN ALFA 60 MCG/0.3ML IJ SOSY
60.0000 ug | PREFILLED_SYRINGE | Freq: Once | INTRAMUSCULAR | Status: DC
Start: 2016-12-11 — End: 2016-12-11

## 2016-12-11 ENCOUNTER — Ambulatory Visit
Admission: RE | Admit: 2016-12-11 | Discharge: 2016-12-11 | Disposition: A | Discharge status: Home / Self Care | Payer: Medicare Other | Source: Ambulatory Visit | Attending: Nephrology | Admitting: Nephrology

## 2016-12-11 ENCOUNTER — Ambulatory Visit: Payer: Medicare Other

## 2016-12-11 VITALS — BP 148/62 | HR 78 | Temp 98.1°F | Resp 18

## 2016-12-11 DIAGNOSIS — N185 Chronic kidney disease, stage 5: Secondary | ICD-10-CM

## 2016-12-11 DIAGNOSIS — D631 Anemia in chronic kidney disease: Secondary | ICD-10-CM | POA: Insufficient documentation

## 2016-12-11 LAB — CBC AND DIFFERENTIAL
Absolute NRBC: 0 10*3/uL
Basophils Absolute Automated: 0.05 10*3/uL (ref 0.00–0.20)
Basophils Automated: 0.6 %
Eosinophils Absolute Automated: 0.53 10*3/uL (ref 0.00–0.70)
Eosinophils Automated: 6.4 %
Hematocrit: 35 % — ABNORMAL LOW (ref 37.0–47.0)
Hgb: 10.7 g/dL — ABNORMAL LOW (ref 12.0–16.0)
Immature Granulocytes Absolute: 0.02 10*3/uL
Immature Granulocytes: 0.2 %
Lymphocytes Absolute Automated: 1.82 10*3/uL (ref 0.50–4.40)
Lymphocytes Automated: 22.1 %
MCH: 21.1 pg — ABNORMAL LOW (ref 28.0–32.0)
MCHC: 30.6 g/dL — ABNORMAL LOW (ref 32.0–36.0)
MCV: 69 fL — ABNORMAL LOW (ref 80.0–100.0)
MPV: 10.9 fL (ref 9.4–12.3)
Monocytes Absolute Automated: 0.56 10*3/uL (ref 0.00–1.20)
Monocytes: 6.8 %
Neutrophils Absolute: 5.24 10*3/uL (ref 1.80–8.10)
Neutrophils: 63.9 %
Nucleated RBC: 0 /100 WBC (ref 0.0–1.0)
Platelets: 270 10*3/uL (ref 140–400)
RBC: 5.07 10*6/uL (ref 4.20–5.40)
RDW: 18 % — ABNORMAL HIGH (ref 12–15)
WBC: 8.22 10*3/uL (ref 3.50–10.80)

## 2016-12-11 NOTE — Progress Notes (Signed)
Time: [place] is a 66 y.o. female here for Aranesp injection.  Offers no c/o's.  Hgb is 10.7  on 12/11/16.  Aranesp NOT GIVEN due to TO HGB 10.7.Patient and her daughter left without incident.  Please see MAR, VS for more details.  [id number], pt discharged stable, NAD.  RTC 12/11/16    Shela Commons, RN

## 2016-12-13 ENCOUNTER — Telehealth (INDEPENDENT_AMBULATORY_CARE_PROVIDER_SITE_OTHER): Payer: Self-pay

## 2016-12-25 ENCOUNTER — Encounter (INDEPENDENT_AMBULATORY_CARE_PROVIDER_SITE_OTHER): Payer: Self-pay | Admitting: Family Medicine

## 2016-12-25 ENCOUNTER — Ambulatory Visit (INDEPENDENT_AMBULATORY_CARE_PROVIDER_SITE_OTHER): Payer: Medicare Other | Admitting: Family Medicine

## 2016-12-25 VITALS — BP 137/66 | HR 92 | Temp 97.7°F | Resp 19 | Wt 127.8 lb

## 2016-12-25 DIAGNOSIS — R059 Cough, unspecified: Secondary | ICD-10-CM

## 2016-12-25 DIAGNOSIS — R05 Cough: Secondary | ICD-10-CM

## 2016-12-25 MED ORDER — AZITHROMYCIN 250 MG PO TABS
ORAL_TABLET | ORAL | 0 refills | Status: DC
Start: 2016-12-25 — End: 2016-12-25

## 2016-12-25 MED ORDER — AZITHROMYCIN 250 MG PO TABS
ORAL_TABLET | ORAL | 0 refills | Status: AC
Start: 2016-12-25 — End: 2016-12-30

## 2016-12-25 MED ORDER — HYDROCOD POLST-CPM POLST ER 10-8 MG/5ML PO SUER
2.5000 mL | Freq: Every evening | ORAL | 0 refills | Status: DC
Start: 2016-12-25 — End: 2016-12-25

## 2016-12-25 MED ORDER — HYDROCOD POLST-CPM POLST ER 10-8 MG/5ML PO SUER
2.5000 mL | Freq: Every evening | ORAL | 0 refills | Status: DC
Start: 2016-12-25 — End: 2017-03-08

## 2016-12-25 NOTE — Progress Notes (Signed)
Subjective:      Date: 12/25/2016 6:12 PM   Patient ID: Jacqueline Weber is a 66 y.o. female.    Chief Complaint:  Chief Complaint   Patient presents with   . Cough     x 2 months        HPI:  Cough   This is a new problem. Episode onset: x 2 months. The problem has been unchanged. The problem occurs constantly. The cough is productive of sputum. Associated symptoms include chest pain, nasal congestion, rhinorrhea, a sore throat and wheezing. Associated symptoms comments: Nose bleeds and sometimes blood when coughing   . Nothing aggravates the symptoms. Treatments tried: mucinex. The treatment provided no relief.       Problem List:  Patient Active Problem List   Diagnosis   . Type 2 diabetes mellitus without complication   . HTN (hypertension) urgency   . Hyperlipidemia   . History of CVA (cerebrovascular accident)   . Elevated brain natriuretic peptide (BNP) level   . Hyponatremia   . Renal insufficiency   . NSTEMI (non-ST elevated myocardial infarction)   . Acute hyponatremia   . Chronic renal impairment, unspecified CKD stage       Current Medications:  Current Outpatient Prescriptions   Medication Sig Dispense Refill   . acetaminophen (TYLENOL) 325 MG tablet Take 2 tablets (650 mg total) by mouth every 4 (four) hours as needed for Pain or Fever.     [name] amLODIPine (NORVASC) 10 MG tablet Take 1 tablet (10 mg total) by mouth daily. 90 tablet 1   . aspirin EC 325 MG EC tablet Take 1 tablet (325 mg total) by mouth daily. 30 tablet 0   . bumetanide (BUMEX) 0.5 MG tablet Take 1 tablet (0.5 mg total) by mouth daily. (Patient taking differently: Take 0.5 mg by mouth every morning.    ) 90 tablet 1   . carvedilol (COREG) 25 MG tablet 12.5 mg 2 (two) times daily with meals.         . cloNIDine (CATAPRES) 0.2 MG tablet Take 1 tablet (0.2 mg total) by mouth 2 (two) times daily. 60 tablet 0   . ferrous sulfate 324 (65 FE) MG Tablet Delayed Response Take 1 tablet (324 mg total) by mouth every morning with breakfast. 90 tablet 1    . ondansetron (ZOFRAN-ODT) 4 MG disintegrating tablet Take 1 tablet (4 mg total) by mouth every 6 (six) hours as needed for Nausea. 20 tablet 3   . pantoprazole (PROTONIX) 40 MG tablet Take 1 tablet (40 mg total) by mouth every morning before breakfast. 90 tablet 1   . polyethylene glycol (MIRALAX) packet Take 17 g by mouth daily. 30 each 3   . simvastatin (ZOCOR) 20 MG tablet Take 20 mg by mouth nightly.         . vitamin D, ergocalciferol, (DRISDOL) 50000 UNIT Cap Take 2,000 Units by mouth 2 (two) times daily.     [name] azithromycin (ZITHROMAX) 250 MG tablet Two tabs PO day 1 and then one tab PO daily days 2-5 6 tablet 0   . hydrALAZINE (APRESOLINE) 100 MG tablet Take 1 tablet (100 mg total) by mouth 3 (three) times daily. (Patient taking differently: Take 100 mg by mouth 2 (two) times daily.    ) 270 tablet 1   . hydrocodone-chlorpheniramine (TUSSIONEX) 10-8 MG/5ML suspension Take 2.5 mLs by mouth nightly. 115 mL 0   . senna-docusate (PERICOLACE) 8.6-50 MG per tablet Take 2 tablets by mouth 2 (  two) times daily. 30 tablet 0   . traMADol (ULTRAM) 50 MG tablet Take 50 mg by mouth every 6 (six) hours as needed for Pain.       No current facility-administered medications for this visit.        Allergies:  Allergies   Allergen Reactions   . Mosquito (Culex Pipiens) Allergy Skin Test Shortness Of Breath     And  Trouble  breathing   . Lisinopril    . Shellfish-Derived Products Hives     New  allergy  New  allergy   . Penicillins Rash     Swelling,  numbness       Past Medical History:  Past Medical History:   Diagnosis Date   . Anemia 10/02/2016    H/O ANEMIA - REFLECTED ON CURRENT LABS.   [name] Cataracts, bilateral     H/O CATARACTS - SURGERY DONE - STILL HAS SOME ISSUES.   . Cerebrovascular accident     H/O STROKE - WEAKNESS ON LEFT SIDE. NO NEUROLOGIST.    [name] Constipation    . Coronary artery disease 06/2016    H/O QUADRUPLE BYPASS SURGERY - FOLLOWED BY [facility].   [name] History of MI (myocardial infarction)    . 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 Hypercholesteremia     CONTROLLED WITH MEDS.   [name] Hypertension     CONTROLLED WITH MEDS.   [name] Renal insufficiency     CHRONIC KIDNEY DISEASE - FOLLOWED BY DR. Kate Sable.   . Type 2 diabetes mellitus, controlled 09/21/2016    BLOOD SUGARS HAVE BEEN WITHIN GOOD RANGE PER PCP - BLOOD SUGAR MEDICATIONS HAVE BEEN DISCONTINUED.       Past Surgical History:  Past Surgical History:   Procedure Laterality Date   . CORONARY ARTERY BYPASS N/A 06/12/2016    Procedure: Coronary Artery Bypass x 4.;  Surgeon: Bjorn Loser, MD;  Location: [facility];  Service: Cardiothoracic;  Laterality: N/A;  LIMA to LDA  Vein to PDA  Vein to OM  Vein to Diagonal   . EGD, COLONOSCOPY N/A 10/04/2016   . EGD, COLONOSCOPY N/A 10/04/2016    Procedure: EGD w/ bx, COLONOSCOPY w/ bx, polypectomy;  Surgeon: Norton Blizzard, MD;  Location: [facility];  Service: Gastroenterology;  Laterality: N/A;   . ENDOSCOPIC,VEIN HARVEST N/A 06/12/2016    Procedure: Endoscopic, Left Leg Greater Saphenous Vein Harvest;  Surgeon: Bjorn Loser, MD;  Location: [facility] OR;  Service: Cardiothoracic;  Laterality: N/A;  Left Leg Incision (medially, groin to ankle) @ 0742  Vein out of leg @ 0826  Vein prep complete @ 0845  Total time = 63 minutes       Family History:  Family History   Problem Relation Age of Onset   . No known problems Mother    . No known problems Father        Social History:  Social History     Social History   . Marital status: Widowed     Spouse name: N/A   . Number of children: N/A   . Years of education: N/A     Occupational History   . Not on file.     Social History Main Topics   . Smoking status: Never Smoker   . Smokeless tobacco: Never Used   . Alcohol use No   . Drug use: No   . Sexual activity: Not on file  Other Topics Concern   . Not on file     Social History Narrative   . No narrative on file       The following sections were reviewed this encounter by the provider:   Tobacco  Allergies  Meds   Problems  Med Hx  Surg Hx  Fam Hx  Soc Hx          Vitals:  BP 137/66   Pulse 92   Temp 97.7 F (36.5 C) (Oral)   Resp 19   Wt 58 kg (127 lb 12.8 oz)   SpO2 96%   BMI 24.96 kg/m         ROS:  Constitutional: Negative for fever, chills and malaise/fatigue.   Respiratory: Pos for cough, shortness of breath and wheezing.    Cardiovascular: Negative for chest pain and palpitations.   Gastrointestinal: Negative for abdominal pain, diarrhea and constipation.   Musculoskeletal: Negative for joint pain. some pain in L knee.  Skin: Negative for rash.   Neurological: Pos for dizziness and headaches from time to time .   Psychiatric/Behavioral: The patient does not have insomnia.         Objective:     Physical Exam:  Constitutional: Patient is oriented to person, place, and time. Appears well-developed and well-nourished.   HENT:   Head: Normocephalic and atraumatic.   Eyes: Conjunctivae and EOM are normal. Pupils are equal, round, and reactive to light.   Cardiovascular: Normal rate, regular rhythm and normal heart sounds.  Exam reveals no gallop and no friction rub.    No murmur heard.  Pulmonary/Chest: Effort normal and breath sounds normal. Has no wheezes.   Musculoskeletal: Normal range of motion. Exhibits no edema or tenderness.   Neurological: Patient is alert and oriented to person, place, and time.   Psychiatric: Patient has a normal mood and affect.      Patient cant sleep because she is coughing all night .  Assessment/Plan:       1. Cough  - XR Chest 2 Views; Future  - hydrocodone-chlorpheniramine (TUSSIONEX) 10-8 MG/5ML suspension; Take 2.5 mLs by mouth nightly.  Dispense: 115 mL; Refill: 0  - azithromycin (ZITHROMAX) 250 MG tablet; Two tabs PO day 1 and then one tab PO daily days 2-5  Dispense: 6 tablet; Refill: 0          Clearance Coots, MD

## 2016-12-28 ENCOUNTER — Telehealth (INDEPENDENT_AMBULATORY_CARE_PROVIDER_SITE_OTHER): Payer: Self-pay | Admitting: Family Medicine

## 2016-12-28 NOTE — Telephone Encounter (Signed)
Pt was seen on Monday and given cough med.But her ins is nor covering the cost.She is requesting Dr.Noelle to write out something else.She would like the nurse to call her with suggestions .  Thanks

## 2016-12-28 NOTE — Telephone Encounter (Signed)
All the other cough meds are OTC tussinex etc. The one with the codein is prescription

## 2016-12-29 ENCOUNTER — Telehealth (INDEPENDENT_AMBULATORY_CARE_PROVIDER_SITE_OTHER): Payer: Self-pay | Admitting: Family Medicine

## 2016-12-29 NOTE — Telephone Encounter (Signed)
Pt daughter called to see if there is another cough medication that would be covered by her insurance; please call her back [phone]-

## 2016-12-29 NOTE — Telephone Encounter (Signed)
As far as I know this is the only one. She can try tessalon pearls 100 mg po q4 h  20x1 refill.

## 2017-01-01 NOTE — Telephone Encounter (Signed)
Done

## 2017-01-05 MED ORDER — DARBEPOETIN ALFA 60 MCG/0.3ML IJ SOSY
60.0000 ug | PREFILLED_SYRINGE | Freq: Once | INTRAMUSCULAR | Status: AC
Start: 2017-01-08 — End: 2017-01-08
  Administered 2017-01-08: 12:00:00 60 ug via SUBCUTANEOUS

## 2017-01-08 ENCOUNTER — Ambulatory Visit
Admission: RE | Admit: 2017-01-08 | Discharge: 2017-01-08 | Disposition: A | Discharge status: Home / Self Care | Payer: Medicare Other | Source: Ambulatory Visit | Attending: Nephrology | Admitting: Nephrology

## 2017-01-08 ENCOUNTER — Ambulatory Visit: Payer: Medicare Other

## 2017-01-08 VITALS — BP 125/29 | HR 80 | Temp 97.7°F | Resp 17

## 2017-01-08 DIAGNOSIS — N185 Chronic kidney disease, stage 5: Secondary | ICD-10-CM | POA: Insufficient documentation

## 2017-01-08 DIAGNOSIS — D631 Anemia in chronic kidney disease: Secondary | ICD-10-CM | POA: Insufficient documentation

## 2017-01-08 LAB — CBC
Absolute NRBC: 0 10*3/uL
Hematocrit: 25.4 % — ABNORMAL LOW (ref 37.0–47.0)
Hgb: 8 g/dL — ABNORMAL LOW (ref 12.0–16.0)
MCH: 21.7 pg — ABNORMAL LOW (ref 28.0–32.0)
MCHC: 31.5 g/dL — ABNORMAL LOW (ref 32.0–36.0)
MCV: 69 fL — ABNORMAL LOW (ref 80.0–100.0)
MPV: 9.4 fL (ref 9.4–12.3)
Nucleated RBC: 0 /100 WBC (ref 0.0–1.0)
Platelets: 159 10*3/uL (ref 140–400)
RBC: 3.68 10*6/uL — ABNORMAL LOW (ref 4.20–5.40)
RDW: 17 % — ABNORMAL HIGH (ref 12–15)
WBC: 5.21 10*3/uL (ref 3.50–10.80)

## 2017-01-08 LAB — FERRITIN: Ferritin: 250.18 ng/mL — ABNORMAL HIGH (ref 4.60–204.00)

## 2017-01-08 LAB — HEMOLYSIS INDEX: Hemolysis Index: 5 (ref 0–18)

## 2017-01-08 MED ORDER — DARBEPOETIN ALFA 60 MCG/0.3ML IJ SOSY
PREFILLED_SYRINGE | INTRAMUSCULAR | Status: AC
Start: 2017-01-08 — End: ?
  Filled 2017-01-08: qty 0.3

## 2017-01-08 NOTE — Progress Notes (Signed)
Time: [place] is a 66 y.o. female here for Aranesp injection.  Offers 4/10 c/o's HA--PT did not take anything for the HA. Hgb is 8.0 and Hct is 25.4 today.  Aranesp 60 mcg given SQ into her LUA without incident.  Please see MAR, VS for more details.  PT discharged stable, NAD.  RTC 01/16/07  Mardelle Matte, RN 12:22 PM

## 2017-01-11 ENCOUNTER — Other Ambulatory Visit (INDEPENDENT_AMBULATORY_CARE_PROVIDER_SITE_OTHER): Payer: Self-pay | Admitting: Internal Medicine

## 2017-01-12 MED ORDER — DARBEPOETIN ALFA 60 MCG/0.3ML IJ SOSY
60.0000 ug | PREFILLED_SYRINGE | Freq: Once | INTRAMUSCULAR | Status: AC
Start: 2017-01-15 — End: 2017-01-15
  Administered 2017-01-15: 12:00:00 60 ug via SUBCUTANEOUS

## 2017-01-15 ENCOUNTER — Ambulatory Visit: Payer: Medicare Other

## 2017-01-15 VITALS — BP 190/81 | HR 88 | Temp 98.6°F | Resp 18

## 2017-01-15 DIAGNOSIS — N185 Chronic kidney disease, stage 5: Secondary | ICD-10-CM

## 2017-01-15 MED ORDER — DARBEPOETIN ALFA 60 MCG/0.3ML IJ SOSY
PREFILLED_SYRINGE | INTRAMUSCULAR | Status: AC
Start: 2017-01-15 — End: ?
  Filled 2017-01-15: qty 0.3

## 2017-01-15 NOTE — Progress Notes (Signed)
Time: [place] is a 66 y.o. female here for Aranesp injection.  Offers no c/o's.  Hgb is 8.0 and Hct is 25.4 on 01/08/17.  Aranesp 60 mcg given SQ into rua without incident.  Please see MAR, VS for more details.  [id number], pt discharged stable, NAD.  RTC 01/22/17    Shela Commons, RN

## 2017-01-19 MED ORDER — DARBEPOETIN ALFA 60 MCG/0.3ML IJ SOSY
60.0000 ug | PREFILLED_SYRINGE | Freq: Once | INTRAMUSCULAR | Status: AC
Start: 2017-01-22 — End: 2017-01-22
  Administered 2017-01-22: 12:00:00 60 ug via SUBCUTANEOUS

## 2017-01-22 ENCOUNTER — Ambulatory Visit
Admission: RE | Admit: 2017-01-22 | Discharge: 2017-01-22 | Disposition: A | Discharge status: Home / Self Care | Payer: Medicare Other | Source: Ambulatory Visit | Attending: Family Medicine | Admitting: Family Medicine

## 2017-01-22 ENCOUNTER — Ambulatory Visit: Payer: Medicare Other

## 2017-01-22 VITALS — BP 154/75 | HR 73 | Temp 98.0°F | Resp 17

## 2017-01-22 DIAGNOSIS — N185 Chronic kidney disease, stage 5: Secondary | ICD-10-CM

## 2017-01-22 DIAGNOSIS — R05 Cough: Secondary | ICD-10-CM | POA: Insufficient documentation

## 2017-01-22 DIAGNOSIS — R059 Cough, unspecified: Secondary | ICD-10-CM

## 2017-01-22 MED ORDER — DARBEPOETIN ALFA 60 MCG/0.3ML IJ SOSY
PREFILLED_SYRINGE | INTRAMUSCULAR | Status: AC
Start: 2017-01-22 — End: ?
  Filled 2017-01-22: qty 0.3

## 2017-01-22 NOTE — Progress Notes (Signed)
Time: [place] is a 66 y.o. female here for Aranesp injection.  Offers no c/o's pain.  Hgb is 8.0 and Hct is 25.4 on 01/08/17.  Aranesp 60 mcg given SQ into LUA without incident.  Please see MAR, VS for more details.  PT discharged stable, NAD.  RTC 01/30/17  Mardelle Matte, RN 2:04 PM

## 2017-01-22 NOTE — Progress Notes (Signed)
Normal x-ray .No abnormalities seen which would explain patient's symptoms  Please f/u if symptome's are not improving or getting worse.

## 2017-01-26 MED ORDER — DARBEPOETIN ALFA 60 MCG/0.3ML IJ SOSY
60.0000 ug | PREFILLED_SYRINGE | Freq: Once | INTRAMUSCULAR | Status: DC
Start: 2017-01-30 — End: 2017-01-30

## 2017-01-30 ENCOUNTER — Ambulatory Visit
Admission: RE | Admit: 2017-01-30 | Discharge: 2017-01-30 | Disposition: A | Discharge status: Home / Self Care | Payer: Medicare Other | Source: Ambulatory Visit | Attending: Nephrology | Admitting: Nephrology

## 2017-01-30 ENCOUNTER — Ambulatory Visit: Payer: Medicare Other

## 2017-01-30 VITALS — BP 114/66 | HR 87 | Resp 17

## 2017-01-30 DIAGNOSIS — N185 Chronic kidney disease, stage 5: Secondary | ICD-10-CM

## 2017-01-30 LAB — CBC
Absolute NRBC: 0 10*3/uL
Hematocrit: 39.4 % (ref 37.0–47.0)
Hgb: 12.1 g/dL (ref 12.0–16.0)
MCH: 21.8 pg — ABNORMAL LOW (ref 28.0–32.0)
MCHC: 30.7 g/dL — ABNORMAL LOW (ref 32.0–36.0)
MCV: 71.1 fL — ABNORMAL LOW (ref 80.0–100.0)
MPV: 9.5 fL (ref 9.4–12.3)
Nucleated RBC: 0 /100 WBC (ref 0.0–1.0)
Platelets: 264 10*3/uL (ref 140–400)
RBC: 5.54 10*6/uL — ABNORMAL HIGH (ref 4.20–5.40)
RDW: 19 % — ABNORMAL HIGH (ref 12–15)
WBC: 6.48 10*3/uL (ref 3.50–10.80)

## 2017-01-30 LAB — HEMOLYSIS INDEX: Hemolysis Index: 10 (ref 0–18)

## 2017-01-30 LAB — FERRITIN: Ferritin: 154.78 ng/mL (ref 4.60–204.00)

## 2017-01-30 MED ORDER — DARBEPOETIN ALFA 60 MCG/0.3ML IJ SOSY
PREFILLED_SYRINGE | INTRAMUSCULAR | Status: AC
Start: 2017-01-30 — End: ?
  Filled 2017-01-30: qty 0.3

## 2017-01-30 NOTE — Progress Notes (Signed)
Jacqueline Weber is a 66 y.o. female here for Aranesp injection.  Offers no c/o's.  Hgb is 12.1 on 01/30/17.  Aranesp not given.   Please see MAR, VS for more details.  pt discharged stable, NAD.  RTC 1 month    Neill Loft, RN

## 2017-02-13 ENCOUNTER — Ambulatory Visit
Admission: RE | Admit: 2017-02-13 | Discharge: 2017-02-13 | Disposition: A | Discharge status: Home / Self Care | Payer: Medicare Other | Source: Ambulatory Visit | Attending: Nephrology | Admitting: Nephrology

## 2017-02-13 DIAGNOSIS — E1122 Type 2 diabetes mellitus with diabetic chronic kidney disease: Secondary | ICD-10-CM | POA: Insufficient documentation

## 2017-02-13 DIAGNOSIS — N184 Chronic kidney disease, stage 4 (severe): Secondary | ICD-10-CM | POA: Insufficient documentation

## 2017-02-13 DIAGNOSIS — D631 Anemia in chronic kidney disease: Secondary | ICD-10-CM | POA: Insufficient documentation

## 2017-02-13 LAB — PROTEIN / CREATININE RATIO, URINE
Urine Creatinine, Random: 80.6 mg/dL
Urine Protein Random: 736.3 mg/dL — ABNORMAL HIGH (ref 1.0–14.0)
Urine Protein/Creatinine Ratio: 9.1

## 2017-02-13 LAB — CBC AND DIFFERENTIAL
Absolute NRBC: 0 10*3/uL
Basophils Absolute Automated: 0.05 10*3/uL (ref 0.00–0.20)
Basophils Automated: 0.8 %
Eosinophils Absolute Automated: 0.29 10*3/uL (ref 0.00–0.70)
Eosinophils Automated: 4.7 %
Hematocrit: 33.1 % — ABNORMAL LOW (ref 37.0–47.0)
Hgb: 10 g/dL — ABNORMAL LOW (ref 12.0–16.0)
Immature Granulocytes Absolute: 0.02 10*3/uL
Immature Granulocytes: 0.3 %
Lymphocytes Absolute Automated: 1.57 10*3/uL (ref 0.50–4.40)
Lymphocytes Automated: 25.2 %
MCH: 21.6 pg — ABNORMAL LOW (ref 28.0–32.0)
MCHC: 30.2 g/dL — ABNORMAL LOW (ref 32.0–36.0)
MCV: 71.5 fL — ABNORMAL LOW (ref 80.0–100.0)
MPV: 10.1 fL (ref 9.4–12.3)
Monocytes Absolute Automated: 0.41 10*3/uL (ref 0.00–1.20)
Monocytes: 6.6 %
Neutrophils Absolute: 3.89 10*3/uL (ref 1.80–8.10)
Neutrophils: 62.4 %
Nucleated RBC: 0 /100 WBC (ref 0.0–1.0)
Platelets: 235 10*3/uL (ref 140–400)
RBC: 4.63 10*6/uL (ref 4.20–5.40)
RDW: 16 % — ABNORMAL HIGH (ref 12–15)
WBC: 6.23 10*3/uL (ref 3.50–10.80)

## 2017-02-13 LAB — RENAL FUNCTION PANEL
Albumin: 2.4 g/dL — ABNORMAL LOW (ref 3.5–5.0)
Anion Gap: 9 (ref 5.0–15.0)
BUN: 54.8 mg/dL — ABNORMAL HIGH (ref 7.0–19.0)
CO2: 19 mEq/L — ABNORMAL LOW (ref 22–29)
Calcium: 8.8 mg/dL (ref 8.5–10.5)
Chloride: 109 mEq/L (ref 100–111)
Creatinine: 3.7 mg/dL — ABNORMAL HIGH (ref 0.6–1.0)
Glucose: 351 mg/dL — ABNORMAL HIGH (ref 70–100)
Phosphorus: 4.3 mg/dL (ref 2.3–4.7)
Potassium: 5.3 mEq/L — ABNORMAL HIGH (ref 3.5–5.1)
Sodium: 137 mEq/L (ref 136–145)

## 2017-02-13 LAB — GFR: EGFR: 12.3

## 2017-02-13 LAB — FERRITIN: Ferritin: 198.77 ng/mL (ref 4.60–204.00)

## 2017-02-13 LAB — HEMOGLOBIN A1C
Average Estimated Glucose: 131.2 mg/dL
Hemoglobin A1C: 6.2 % — ABNORMAL HIGH (ref 4.6–5.9)

## 2017-02-13 LAB — HEMOLYSIS INDEX: Hemolysis Index: 1 (ref 0–18)

## 2017-02-14 LAB — IRON PROFILE
Iron Saturation: 28 % (ref 15–50)
Iron: 55 ug/dL (ref 40–145)
TIBC: 193 ug/dL — ABNORMAL LOW (ref 265–497)
UIBC: 138 ug/dL (ref 126–382)

## 2017-02-26 MED ORDER — DARBEPOETIN ALFA 60 MCG/0.3ML IJ SOSY
60.0000 ug | PREFILLED_SYRINGE | Freq: Once | INTRAMUSCULAR | Status: DC
Start: 2017-02-27 — End: 2017-02-27

## 2017-02-27 ENCOUNTER — Ambulatory Visit
Admission: RE | Admit: 2017-02-27 | Discharge: 2017-02-27 | Disposition: A | Discharge status: Home / Self Care | Payer: Medicare Other | Source: Ambulatory Visit | Attending: Nephrology | Admitting: Nephrology

## 2017-02-27 ENCOUNTER — Ambulatory Visit: Payer: Medicare Other

## 2017-02-27 VITALS — BP 102/64 | HR 63 | Temp 98.0°F | Resp 18

## 2017-02-27 DIAGNOSIS — N185 Chronic kidney disease, stage 5: Secondary | ICD-10-CM | POA: Insufficient documentation

## 2017-02-27 DIAGNOSIS — D631 Anemia in chronic kidney disease: Secondary | ICD-10-CM | POA: Insufficient documentation

## 2017-02-27 LAB — HEMOLYSIS INDEX: Hemolysis Index: 10 (ref 0–18)

## 2017-02-27 LAB — CBC
Absolute NRBC: 0 10*3/uL
Hematocrit: 28 % — ABNORMAL LOW (ref 37.0–47.0)
Hgb: 8.6 g/dL — ABNORMAL LOW (ref 12.0–16.0)
MCH: 21.8 pg — ABNORMAL LOW (ref 28.0–32.0)
MCHC: 30.7 g/dL — ABNORMAL LOW (ref 32.0–36.0)
MCV: 70.9 fL — ABNORMAL LOW (ref 80.0–100.0)
MPV: 10.9 fL (ref 9.4–12.3)
Nucleated RBC: 0 /100 WBC (ref 0.0–1.0)
Platelets: 217 10*3/uL (ref 140–400)
RBC: 3.95 10*6/uL — ABNORMAL LOW (ref 4.20–5.40)
RDW: 15 % (ref 12–15)
WBC: 5.67 10*3/uL (ref 3.50–10.80)

## 2017-02-27 LAB — FERRITIN: Ferritin: 201.54 ng/mL (ref 4.60–204.00)

## 2017-02-27 NOTE — Progress Notes (Signed)
Time: [place] is a 66 y.o. female here for Aranesp injection based on her lab results.  Offers no c/o's pain but per Lab staff when she had her labs drawn ordered by Dr.Assefi, PT started feeling dizzy. Evaluated PT who is a diabetic and stated her BS last night was 168. She took her med for diabetes this AM although she had not eaten anything before coming in for lab draw. PT's V/S stable; PT given crackers to eat and a bottle of water. PT stated she feels better. No Aranesp today Hgb was 10.0 and Hct was 33.1 on 02/13/17.  Please see MAR, VS for more details.  PT discharged stable, NAD.  RTC 03/20/17  Mardelle Matte, RN 1:13 PM

## 2017-03-06 ENCOUNTER — Ambulatory Visit: Payer: Medicare Other

## 2017-03-08 ENCOUNTER — Encounter (INDEPENDENT_AMBULATORY_CARE_PROVIDER_SITE_OTHER): Payer: Self-pay | Admitting: Internal Medicine

## 2017-03-08 ENCOUNTER — Ambulatory Visit (INDEPENDENT_AMBULATORY_CARE_PROVIDER_SITE_OTHER): Payer: Medicare Other | Admitting: Internal Medicine

## 2017-03-08 VITALS — BP 162/75 | HR 76 | Temp 98.1°F | Resp 18 | Ht 60.0 in | Wt 134.0 lb

## 2017-03-08 DIAGNOSIS — E559 Vitamin D deficiency, unspecified: Secondary | ICD-10-CM

## 2017-03-08 DIAGNOSIS — R7303 Prediabetes: Secondary | ICD-10-CM | POA: Insufficient documentation

## 2017-03-08 DIAGNOSIS — R05 Cough: Secondary | ICD-10-CM

## 2017-03-08 DIAGNOSIS — Z0000 Encounter for general adult medical examination without abnormal findings: Secondary | ICD-10-CM

## 2017-03-08 DIAGNOSIS — I10 Essential (primary) hypertension: Secondary | ICD-10-CM

## 2017-03-08 DIAGNOSIS — R059 Cough, unspecified: Secondary | ICD-10-CM

## 2017-03-08 LAB — COMPREHENSIVE METABOLIC PANEL
ALT: 15 U/L (ref 0–55)
AST (SGOT): 23 U/L (ref 5–34)
Albumin/Globulin Ratio: 0.8 — ABNORMAL LOW (ref 0.9–2.2)
Albumin: 2.6 g/dL — ABNORMAL LOW (ref 3.5–5.0)
Alkaline Phosphatase: 81 U/L (ref 37–106)
BUN: 45 mg/dL — ABNORMAL HIGH (ref 7.0–19.0)
Bilirubin, Total: 0.2 mg/dL (ref 0.1–1.2)
CO2: 20 mEq/L — ABNORMAL LOW (ref 21–29)
Calcium: 9.1 mg/dL (ref 8.5–10.5)
Chloride: 106 mEq/L (ref 100–111)
Creatinine: 3.7 mg/dL — ABNORMAL HIGH (ref 0.4–1.5)
Globulin: 3.2 g/dL (ref 2.0–3.7)
Glucose: 205 mg/dL — ABNORMAL HIGH (ref 70–100)
Potassium: 5.3 mEq/L — ABNORMAL HIGH (ref 3.5–5.1)
Protein, Total: 5.8 g/dL — ABNORMAL LOW (ref 6.0–8.3)
Sodium: 134 mEq/L — ABNORMAL LOW (ref 136–145)

## 2017-03-08 LAB — GFR: EGFR: 12.3

## 2017-03-08 LAB — HEMOLYSIS INDEX: Hemolysis Index: 8 (ref 0–18)

## 2017-03-08 LAB — VITAMIN D,25 OH,TOTAL: Vitamin D, 25 OH, Total: 25 ng/mL — ABNORMAL LOW (ref 30–100)

## 2017-03-08 NOTE — Progress Notes (Signed)
Jacqueline Weber is a 66 y.o. female who presents today for a Medicare Annual Wellness Visit.     Health Risk Assessment     During the past month, how would you rate your general health?:  Poor  Which of the following tasks can you do without assistance - drive or take the bus alone; shop for groceries or clothes; prepare your own meals; do your own housework/laundry; handle your own finances/pay bills; eat, bathe or get around your home?:  Prepare your own meals, Do your own housework/laundry  Which of the following problems have you been bothered by in the past month - dizzy when standing up; problems using the phone; feeling tired or fatigued; moderate or severe body pain?: Feeling tired or fatigued, Moderate or severe body pain  Do you exercise for about 20 minutes 3 or more days per week?:  No  During the past month was someone available to help if you needed and wanted help?  For example, if you felt nervous, lonely, got sick and had to stay in bed, needed someone to talk to, needed help with daily chores or needed help just taking care of yourself.: Yes  Do you always wear a seat belt?: Yes  Do you have any trouble taking medications the way you have been told to take them?: No  Have you been given any information that can help you with keeping track of your medications?: Yes  Do you have trouble paying for your medications?: No  Have you been given any information that can help you with hazards in your house, such as scatter rugs, furniture, etc?: Yes  Do you feel unsteady when standing or walking?: Yes  Do you worry about falling?: No  Have you fallen two or more times in the past year?: No  Did you suffer any injuries from your falls in the past year?: No    Additional Concerns    Patient Care Team:  John Giovanni, MD as PCP - General (Internal Medicine)  Karl Bales as Technician    Past Medical History:   Diagnosis Date   . Anemia 10/02/2016    H/O ANEMIA - REFLECTED ON CURRENT LABS.   [name]  Cataracts, bilateral     H/O CATARACTS - SURGERY DONE - STILL HAS SOME ISSUES.   . Cerebrovascular accident     H/O STROKE - WEAKNESS ON LEFT SIDE. NO NEUROLOGIST.    [name] Constipation    . Coronary artery disease 06/2016    H/O QUADRUPLE BYPASS SURGERY - FOLLOWED BY [facility].   [name] History of MI (myocardial infarction)    . 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 Hypercholesteremia     CONTROLLED WITH MEDS.   [name] Hypertension     CONTROLLED WITH MEDS.   . Pre-diabetes    . Renal insufficiency     CHRONIC KIDNEY DISEASE - FOLLOWED BY DR. Kate Sable.     Past Surgical History:   Procedure Laterality Date   . CORONARY ARTERY BYPASS N/A 06/12/2016    Procedure: Coronary Artery Bypass x 4.;  Surgeon: Bjorn Loser, MD;  Location: [facility] OR;  Service: Cardiothoracic;  Laterality: N/A;  LIMA to LDA  Vein to PDA  Vein to OM  Vein to Diagonal   . EGD, COLONOSCOPY N/A 10/04/2016   . EGD, COLONOSCOPY N/A 10/04/2016    Procedure: EGD w/ bx, COLONOSCOPY w/ bx, polypectomy;  Surgeon: Norton Blizzard, MD;  Location:  [facility];  Service: Gastroenterology;  Laterality: N/A;   . ENDOSCOPIC,VEIN HARVEST N/A 06/12/2016    Procedure: Endoscopic, Left Leg Greater Saphenous Vein Harvest;  Surgeon: Bjorn Loser, MD;  Location: [facility];  Service: Cardiothoracic;  Laterality: N/A;  Left Leg Incision (medially, groin to ankle) @ 0742  Vein out of leg @ 0826  Vein prep complete @ 0845  Total time = 63 minutes     Allergies   Allergen Reactions   . Mosquito (Culex Pipiens) Allergy Skin Test Shortness Of Breath     And  Trouble  breathing   . Lisinopril    . Shellfish-Derived Products Hives     New  allergy  New  allergy   . Penicillins Rash     Swelling,  numbness      Current Outpatient Prescriptions   Medication Sig Dispense Refill   . acetaminophen (TYLENOL) 325 MG tablet Take 2 tablets (650 mg total) by mouth every 4 (four) hours as needed for Pain or Fever.     [name] amLODIPine (NORVASC) 10 MG tablet Take 1 tablet (10 mg total) by  mouth daily. 90 tablet 1   . aspirin EC 325 MG EC tablet Take 1 tablet (325 mg total) by mouth daily. 30 tablet 0   . bumetanide (BUMEX) 0.5 MG tablet Take 1 tablet (0.5 mg total) by mouth daily. (Patient taking differently: Take 0.5 mg by mouth every morning.    ) 90 tablet 1   . carvedilol (COREG) 25 MG tablet 12.5 mg 2 (two) times daily with meals.         . Cholecalciferol (VITAMIN D3) 2000 units Tab Take 4,000 IU by mouth daily.     . cloNIDine (CATAPRES) 0.2 MG tablet Take 1 tablet (0.2 mg total) by mouth 2 (two) times daily. 60 tablet 0   . ferrous sulfate 324 (65 FE) MG Tablet Delayed Response Take 1 tablet (324 mg total) by mouth every morning with breakfast. 90 tablet 1   . hydrALAZINE (APRESOLINE) 100 MG tablet Take 1 tablet (100 mg total) by mouth 3 (three) times daily. (Patient taking differently: Take 100 mg by mouth 2 (two) times daily.    ) 270 tablet 1   . ondansetron (ZOFRAN-ODT) 4 MG disintegrating tablet Take 1 tablet (4 mg total) by mouth every 6 (six) hours as needed for Nausea. 20 tablet 3   . pantoprazole (PROTONIX) 40 MG tablet TAKE ONE TABLET BY MOUTH IN THE MORNING BEFORE BREAKFAST 90 tablet 1   . polyethylene glycol (MIRALAX) packet Take 17 g by mouth daily. 30 each 3   . senna-docusate (PERICOLACE) 8.6-50 MG per tablet Take 2 tablets by mouth 2 (two) times daily. 30 tablet 0   . simvastatin (ZOCOR) 20 MG tablet Take 20 mg by mouth nightly.         . traMADol (ULTRAM) 50 MG tablet Take 50 mg by mouth every 6 (six) hours as needed for Pain.       No current facility-administered medications for this visit.       Social History   Substance Use Topics   . Smoking status: Never Smoker   . Smokeless tobacco: Never Used   . Alcohol use No      Family History   Problem Relation Age of Onset   . No known problems Mother    . No known problems Father         The following sections were reviewed this encounter  by the provider:   Tobacco  Allergies  Meds  Problems  Med Hx  Surg Hx  Fam Hx   Soc Hx          Hospitalizations  no hospitalizations within past 6 months    Depression Screening    See related Activity or Flowsheet    Functional Ability    Falls Risk:  home does not have throw rugs, poor lighting or a slippery bath tub or shower  Hearing:  hearing within normal limits  Exercise:  no exercise  ADL's:   Bathing - independent   Dressing - independent   Mobility - total assistance   Transfer - total assistance   Eating - independent}   Toileting - independent   ADL assistance provided by daughter    Discussion of Advance Directives: Has no Advanced Directive. Not interested in additional information.     Assessment    BP 162/75 (BP Site: Left arm, Patient Position: Sitting, Cuff Size: Medium)   Pulse 76   Temp 98.1 F (36.7 C) (Oral)   Resp 18   Ht 1.524 m (5')   Wt 60.8 kg (134 lb)   SpO2 97%   BMI 26.17 kg/m      Vision Screening (required for IPPE only): Patient states eye exam performed elsewhere within past 12 months          Evaluation of Cognitive Function    Mood/affect: Appropriate  Appearance: neatly groomed, appropriately and adequately nourished  Family member/caregiver input: No concerns          Assessment/Plan    1. Routine physical examination  - Mammo Digital Screening Bilateral W Cad; Future  - Dxa Bone Density Axial Skeleton; Future         Women's Preventive Wellness Plan  Today's Date: March 08, 2017    Patient [employer]    Date of Birth: [birth year]     As part of your wellness benefit, Medicare makes many screening tests available to you at no charge.  A complete list of these tests can be found at their website, [email or web address]. However, many of these tests or recommendations are out of date, or may not apply to you. After careful consideration of your own personal health needs, the following testing is recommended for you:      Preventive Service    Up-to-date (UTD)/Due/Not Applicable (N/A)    Last Done   Medicare Frequency   Body Mass Index   Up-to-date March 08, 2017  (BMI):Body mass index is 26.17 kg/m.   Height:Height: 152.4 cm (5')  Weight:Weight: 60.8 kg (134 lb)  Annually   Blood Pressure: Up-to-date March 08, 2017    BP: 162/75    Every 2 yrs, if BP </= 120/80 mm hg   Annually, if BP >120-139/80-89 mm hg   Cholesterol Testing Up-to-date Lab Results   Component Value Date    LDL 83 10/16/2016      Regularly beginning at age 54 with risk factors   Diabetes Screening Up-to-date Lab Results   Component Value Date    GLU 351 (H) 02/13/2017       If prediabetes, one screening every 6 months   Otherwise, one screening every 12 months with certain risk factors for diabetes   Osteoporosis Screening   (Bone Density Measurement)  Due   Routinely, for women aged 65+   Routinely, for women aged 60-64 with risk factors   Breast Cancer Screening   (Mammogram) Due   Every 2  yrs, aged 64-74 yrs   Cervical Cancer Screening   (Pap Smear)  Not medically indicated   Annually if at high risk for developing cervical or vaginal cancer or childbearing age with abnormal Pap test within past 3 years; Every 2 years for women at normal risk   HPV: Once every 5 years; All asymptomatic female Medicare beneficiaries aged 78 to 50 years   Colorectal Cancer Screening Up-to-date   Annually, Fecal Occult Blood Stool (FOBS)   Every 5 yrs, Sigmoidoscopy with FOBS   Every 10 yrs, Colonoscopy   Every 3 yrs, Cologuard   Depression Screening Up-to-date March 08, 2017   As necessary for those with risk factors   Sexually Transmitted Diseases (STDs) & HIV Screening Not medically indicated   As necessary for those with risk factors   Alcohol Misuse Screening Up-to-date   As necessary for those with risk factors   Immunizations:   Shingrix is due Immunization History   Administered Date(s) Administered   . Influenza quadrivalent (IM) PF 3 Yrs & greater 05/25/2016   . Pneumococcal 23 valent 03/12/2013   . Zoster 05/25/2016      Prevnar 13: 1 dose after age 28   Pneumovax 74: 1 dose 1 year after Prevnar   Influenza: Annually   Advance Directive 5 wishes form given to patient   Once; update as needed   Medical Nutrition Therapy Up-to-date   As necessary for diabetes or renal disease   Smoking Cessation Counseling Not applicable Counseling given: Not Answered    Frequency: two cessation attempts per year.   Glaucoma Screening Up-to-date  . Annually for covered high risk Medicare beneficiaries (one of the following: DM, FHx Glaucoma, African-Americans aged 45+, 71 aged 65+)   Hepatitis C Virus (HCV) Screening Due   Annually only for high risk behavior   Once if born between [place] and are not considered high risk   Lung Cancer Screening Not applicable   Annually if asymptomatic, tobacco smoking history of at least 30 pack-years (one pack-year = smoking one pack per day for one year; 1 pack = 20 cigarettes), and current smoker or one who has quit smoking within the last 15 years     Your major risk factors:       Hypertension, Falls Risk and Coronary Artery Disease (CAD)     Recommendations for improvement:    Low cholesterol diet and Exercise     Referrals:    See After Visit Summary orders         John Giovanni, MD   03/08/2017    Hypotension  Pt felt dizzy and weak, had systolic BP of 151 when when for iron transfusion  Recently had coreg increased to 25 bid by nephrologist as BP was high in nephrologist's office  Pt has resumed previous dosage of 12.5 bid, feels better  Cough  Initial visit with me for this problem  Pt continues to have cough  Seen by another provider, had normal cxr  Took a course of zpak without relief  Diabetes  Januvia d/ced  Pt now not on any meds  Last had HgbA1c checked a month ago 6.2    11 point ROS negative except as above    General Examination:   GENERAL APPEARANCE: alert, in no acute distress, well developed, well nourished, oriented to time, place, and person.   HEAD: normal  appearance, atraumatic.   EYES: extraocular movement intact (EOMI), pupils equal, round, reactive to light and accommodation, sclera  anicteric, conjunctiva clear.   EARS: tympanic membranes normal bilaterally, external canals normal .   NOSE: normal nasal mucosa, no lesions.   ORAL CAVITY: normal oropharynx, normal lips, mucosa moist, no lesions.   THROAT: normal appearance, clear, no erythema.   NECK/THYROID: neck supple,  no cervical lymphadenopathy, no neck mass palpated, no thyromegaly.   LYMPH NODES: no palpable adenopathy.   SKIN: good turgor, no rashes, no suspicious lesions.   HEART: S1, S2 normal, no murmurs, rubs, gallops, regular rate and rhythm.   LUNGS: normal effort / no distress, normal breath sounds, clear to auscultation bilaterally, no wheezes, rales, rhonchi.   ABDOMEN: bowel sounds present, no hepatosplenomegaly, soft, nontender, nondistended.   MUSCULOSKELETAL: full range of motion, no swelling or deformity.   EXTREMITIES: no edema, no clubbing, cyanosis, or edema.   PERIPHERAL PULSES: 2+ dorsalis pedis, 2+ posterior tibial.   NEUROLOGIC: nonfocal, cranial nerves 2-12 grossly intact, deep tendon reflexes 2+ symmetrical, normal strength and tone, sensory exam intact.   PSYCH: cognitive function intact, mood/affect full range, speech clear.         1. Essential hypertension  - Comprehensive metabolic panel  At goal, pt was hypotensive on higher dosage of coreg, advised continuation of lower dosage  Stressed importance of f/u with cardiology. Daughter would like to simplify patient's medication, advised her to work with cardiology and nephrology to see if some meds can be d/ced    2. Cough  - Ambulatory referral to Pulmonology  W/u negative to date  Refer to pulm    3. Vitamin D deficiency  - Vitamin D,25 OH, Total  If elevated, may be able to decrease dosage    4. Prediabetes  Discussed diagnosis with patient and daughter  Pt is now in prediabetic range off meds, suspect improvement due to weight  loss  Stressed importance of continuing with diabetic diet so she does not end up back in the diabetic range.    John Giovanni, MD

## 2017-03-08 NOTE — Patient Instructions (Addendum)
Women's Preventive Wellness Plan  Today's Date: March 08, 2017    Patient [place]    Date of Birth: 07-13-1951     As part of your wellness benefit, Medicare makes many screening tests available to you at no charge.  A complete list of these tests can be found at their website, [employer]. However, many of these tests or recommendations are out of date, or may not apply to you. After careful consideration of your own personal health needs, the following testing is recommended for you:      Preventive Service    Up-to-date (UTD)/Due/Not Applicable (N/A)   Last Done   Medicare Frequency   Body Mass Index   Up-to-date March 08, 2017  (BMI):Body mass index is 26.17 kg/m.   Height:Height: 152.4 cm (5')  Weight:Weight: 60.8 kg (134 lb)  Annually   Blood Pressure: Up-to-date March 08, 2017    BP: 162/75    Every 2 yrs, if BP </= 120/80 mm hg   Annually, if BP >120-139/80-89 mm hg   Cholesterol Testing Up-to-date Lab Results   Component Value Date    LDL 83 10/16/2016      Regularly beginning at age 48 with risk factors   Diabetes Screening Up to date Lab Results   Component Value Date    GLU 351 (H) 02/13/2017       If prediabetes, one screening every 6 months   Otherwise, one screening every 12 months with certain risk factors for diabetes   Osteoporosis Screening   (Bone Density Measurement)  Due   Routinely, for women aged 65+   Routinely, for women aged 60-64 with risk factors   Breast Cancer Screening   (Mammogram) Due   Every 2 yrs, aged 41-74 yrs   Cervical Cancer Screening   (Pap Smear)  Not medically indicated   Annually if at high risk for developing cervical or vaginal cancer or childbearing age with abnormal Pap test within past 3 years; Every 2 years for women at normal risk   HPV: Once every 5 years; All asymptomatic female Medicare beneficiaries aged 62 to 19 years   Colorectal Cancer Screening Up-to-date   Annually,  Fecal Occult Blood Stool (FOBS)   Every 5 yrs, Sigmoidoscopy with FOBS   Every 10 yrs, Colonoscopy   Every 3 yrs, Cologuard   Depression Screening Up-to-date March 08, 2017   As necessary for those with risk factors   Sexually Transmitted Diseases (STDs) & HIV Screening Not medically indicated   As necessary for those with risk factors   Alcohol Misuse Screening Up-to-date   As necessary for those with risk factors   Immunizations:   Shingrix is due Immunization History   Administered Date(s) Administered   . Influenza quadrivalent (IM) PF 3 Yrs & greater 05/25/2016   . Pneumococcal 23 valent 03/12/2013   . Zoster 05/25/2016     Prevnar 13: 1 dose after age 73   Pneumovax 28: 1 dose 1 year after Prevnar   Influenza: Annually   Advance Directive 5 wishes form given to patient   Once; update as needed   Medical Nutrition Therapy Up-to-date   As necessary for diabetes or renal disease   Smoking Cessation Counseling Not applicable Counseling given: Not Answered    Frequency: two cessation attempts per year.   Glaucoma Screening Up-to-date  . Annually for covered high risk Medicare beneficiaries (one of the following: DM, FHx Glaucoma, African-Americans aged 74+, 71 aged 65+)   Hepatitis C Virus (HCV)  Screening Due   Annually only for high risk behavior   Once if born between [place] and are not considered high risk   Lung Cancer Screening Not applicable   Annually if asymptomatic, tobacco smoking history of at least 30 pack-years (one pack-year = smoking one pack per day for one year; 1 pack = 20 cigarettes), and current smoker or one who has quit smoking within the last 15 years     Your major risk factors:       Hypertension, Falls Risk and Coronary Artery Disease (CAD)     Recommendations for improvement:    Low cholesterol diet and Exercise     Referrals:    See After Visit Summary orders         John Giovanni, MD   03/08/2017

## 2017-03-15 ENCOUNTER — Encounter (INDEPENDENT_AMBULATORY_CARE_PROVIDER_SITE_OTHER): Payer: Self-pay

## 2017-03-19 MED ORDER — DARBEPOETIN ALFA 60 MCG/0.3ML IJ SOSY
60.0000 ug | PREFILLED_SYRINGE | Freq: Once | INTRAMUSCULAR | Status: AC
Start: 2017-03-20 — End: 2017-03-20
  Administered 2017-03-20: 13:00:00 60 ug via SUBCUTANEOUS

## 2017-03-20 ENCOUNTER — Ambulatory Visit: Payer: Medicare Other

## 2017-03-20 ENCOUNTER — Ambulatory Visit
Admission: RE | Admit: 2017-03-20 | Discharge: 2017-03-20 | Disposition: A | Discharge status: Home / Self Care | Payer: Medicare Other | Source: Ambulatory Visit | Attending: Nephrology | Admitting: Nephrology

## 2017-03-20 VITALS — BP 174/74 | HR 70 | Temp 97.4°F | Resp 18

## 2017-03-20 DIAGNOSIS — N185 Chronic kidney disease, stage 5: Secondary | ICD-10-CM | POA: Insufficient documentation

## 2017-03-20 DIAGNOSIS — D631 Anemia in chronic kidney disease: Secondary | ICD-10-CM | POA: Insufficient documentation

## 2017-03-20 LAB — HEMOLYSIS INDEX: Hemolysis Index: 15 (ref 0–18)

## 2017-03-20 LAB — CBC AND DIFFERENTIAL
Absolute NRBC: 0 10*3/uL
Basophils Absolute Automated: 0.03 10*3/uL (ref 0.00–0.20)
Basophils Automated: 0.4 %
Eosinophils Absolute Automated: 0.28 10*3/uL (ref 0.00–0.70)
Eosinophils Automated: 3.9 %
Hematocrit: 26.6 % — ABNORMAL LOW (ref 37.0–47.0)
Hgb: 8.1 g/dL — ABNORMAL LOW (ref 12.0–16.0)
Immature Granulocytes Absolute: 0.02 10*3/uL
Immature Granulocytes: 0.3 %
Lymphocytes Absolute Automated: 1.95 10*3/uL (ref 0.50–4.40)
Lymphocytes Automated: 27.4 %
MCH: 21.7 pg — ABNORMAL LOW (ref 28.0–32.0)
MCHC: 30.5 g/dL — ABNORMAL LOW (ref 32.0–36.0)
MCV: 71.3 fL — ABNORMAL LOW (ref 80.0–100.0)
MPV: 10 fL (ref 9.4–12.3)
Monocytes Absolute Automated: 0.51 10*3/uL (ref 0.00–1.20)
Monocytes: 7.2 %
Neutrophils Absolute: 4.33 10*3/uL (ref 1.80–8.10)
Neutrophils: 60.8 %
Nucleated RBC: 0 /100 WBC (ref 0.0–1.0)
Platelets: 199 10*3/uL (ref 140–400)
RBC: 3.73 10*6/uL — ABNORMAL LOW (ref 4.20–5.40)
RDW: 15 % (ref 12–15)
WBC: 7.12 10*3/uL (ref 3.50–10.80)

## 2017-03-20 LAB — FERRITIN: Ferritin: 218.06 ng/mL — ABNORMAL HIGH (ref 4.60–204.00)

## 2017-03-20 MED ORDER — DARBEPOETIN ALFA 60 MCG/0.3ML IJ SOSY
PREFILLED_SYRINGE | INTRAMUSCULAR | Status: AC
Start: 2017-03-20 — End: ?
  Filled 2017-03-20: qty 0.3

## 2017-03-20 NOTE — Progress Notes (Signed)
Time: [place] is a 66 y.o. female here for Aranesp injection. Patient is accompanied by her daughter . Offers  C/o's fatigue and being cold .  Hgb is 8.1 and Hct is 26.6  on 03/20/17. Instructed patients daughter if patient complaining of increased fatigued, SOB,palpatations , headaches or dizziness to notify physician or go to ER for evaluation   Aranesp 60 mcg given SQ into LUA without incident.  Please see MAR, VS for more details.  [id number], pt discharged stable, NAD.  RTC 03/27/17    Shela Commons, RN

## 2017-03-21 ENCOUNTER — Ambulatory Visit (INDEPENDENT_AMBULATORY_CARE_PROVIDER_SITE_OTHER): Payer: Self-pay | Admitting: Cardiovascular Disease

## 2017-03-21 DIAGNOSIS — I351 Nonrheumatic aortic (valve) insufficiency: Secondary | ICD-10-CM | POA: Insufficient documentation

## 2017-03-22 ENCOUNTER — Other Ambulatory Visit (INDEPENDENT_AMBULATORY_CARE_PROVIDER_SITE_OTHER): Payer: Self-pay | Admitting: Internal Medicine

## 2017-03-23 ENCOUNTER — Other Ambulatory Visit (INDEPENDENT_AMBULATORY_CARE_PROVIDER_SITE_OTHER): Payer: Self-pay | Admitting: Internal Medicine

## 2017-03-23 DIAGNOSIS — Z0000 Encounter for general adult medical examination without abnormal findings: Secondary | ICD-10-CM

## 2017-03-26 ENCOUNTER — Other Ambulatory Visit (INDEPENDENT_AMBULATORY_CARE_PROVIDER_SITE_OTHER): Payer: Self-pay

## 2017-03-26 ENCOUNTER — Ambulatory Visit (INDEPENDENT_AMBULATORY_CARE_PROVIDER_SITE_OTHER): Payer: Self-pay

## 2017-03-26 MED ORDER — DARBEPOETIN ALFA 60 MCG/0.3ML IJ SOSY
60.0000 ug | PREFILLED_SYRINGE | Freq: Once | INTRAMUSCULAR | Status: AC
Start: 2017-03-27 — End: 2017-03-27
  Administered 2017-03-27: 12:00:00 60 ug via SUBCUTANEOUS

## 2017-03-27 ENCOUNTER — Ambulatory Visit: Payer: Medicare Other

## 2017-03-27 VITALS — BP 169/68 | HR 78 | Temp 97.8°F | Resp 18

## 2017-03-27 DIAGNOSIS — N185 Chronic kidney disease, stage 5: Secondary | ICD-10-CM

## 2017-03-27 MED ORDER — DARBEPOETIN ALFA 60 MCG/0.3ML IJ SOSY
PREFILLED_SYRINGE | INTRAMUSCULAR | Status: AC
Start: 2017-03-27 — End: ?
  Filled 2017-03-27: qty 0.3

## 2017-03-27 NOTE — Progress Notes (Signed)
Time: [place] is a 66 y.o. female here for Aranesp injection.  Offers no c/o's.  Hgb is 8.1 on 7/17.  Aranesp 60 mcg given SQ into rua without incident.  Please see MAR, VS for more details.  [id number], pt discharged stable, NAD.  RTC 04/03/17    Shela Commons, RN

## 2017-04-02 MED ORDER — DARBEPOETIN ALFA 60 MCG/0.3ML IJ SOSY
60.0000 ug | PREFILLED_SYRINGE | Freq: Once | INTRAMUSCULAR | Status: AC
Start: 2017-04-03 — End: 2017-04-03
  Administered 2017-04-03: 12:00:00 60 ug via SUBCUTANEOUS

## 2017-04-03 ENCOUNTER — Ambulatory Visit: Payer: Medicare Other

## 2017-04-03 VITALS — BP 166/76 | HR 77 | Temp 98.4°F | Resp 17

## 2017-04-03 DIAGNOSIS — N185 Chronic kidney disease, stage 5: Secondary | ICD-10-CM

## 2017-04-03 MED ORDER — DARBEPOETIN ALFA 60 MCG/0.3ML IJ SOSY
PREFILLED_SYRINGE | INTRAMUSCULAR | Status: AC
Start: 2017-04-03 — End: ?
  Filled 2017-04-03: qty 0.3

## 2017-04-03 NOTE — Progress Notes (Signed)
Time: [place] is a 66 y.o. female here for Aranesp injection.  Offers no c/o's.  Hgb is 8.1 and Hct is 26.6 on 03/20/17.  Aranesp 60 mcg given SQ into her RUA without incident.  Please see MAR, VS for more details.  PT discharged stable, NAD.  RTC 04/10/17  Mardelle Matte, RN 12:28 PM

## 2017-04-04 DIAGNOSIS — N186 End stage renal disease: Secondary | ICD-10-CM

## 2017-04-04 HISTORY — DX: End stage renal disease: N18.6

## 2017-04-09 MED ORDER — DARBEPOETIN ALFA 60 MCG/0.3ML IJ SOSY
60.0000 ug | PREFILLED_SYRINGE | Freq: Once | INTRAMUSCULAR | Status: DC
Start: 2017-04-10 — End: 2017-04-10

## 2017-04-10 ENCOUNTER — Ambulatory Visit
Admission: RE | Admit: 2017-04-10 | Discharge: 2017-04-10 | Disposition: A | Discharge status: Home / Self Care | Payer: Medicare Other | Source: Ambulatory Visit | Attending: Nephrology | Admitting: Nephrology

## 2017-04-10 ENCOUNTER — Ambulatory Visit: Payer: Medicare Other

## 2017-04-10 VITALS — BP 147/71 | HR 87 | Temp 97.6°F | Resp 16

## 2017-04-10 DIAGNOSIS — N185 Chronic kidney disease, stage 5: Secondary | ICD-10-CM | POA: Insufficient documentation

## 2017-04-10 DIAGNOSIS — D631 Anemia in chronic kidney disease: Secondary | ICD-10-CM | POA: Insufficient documentation

## 2017-04-10 LAB — RENAL FUNCTION PANEL
Albumin: 2.2 g/dL — ABNORMAL LOW (ref 3.5–5.0)
Anion Gap: 10 (ref 5.0–15.0)
BUN: 107.1 mg/dL — ABNORMAL HIGH (ref 7.0–19.0)
CO2: 21 mEq/L — ABNORMAL LOW (ref 22–29)
Calcium: 8.1 mg/dL — ABNORMAL LOW (ref 8.5–10.5)
Chloride: 102 mEq/L (ref 100–111)
Creatinine: 5.6 mg/dL — ABNORMAL HIGH (ref 0.6–1.0)
Glucose: 176 mg/dL — ABNORMAL HIGH (ref 70–100)
Phosphorus: 6.9 mg/dL — ABNORMAL HIGH (ref 2.3–4.7)
Potassium: 6 mEq/L — ABNORMAL HIGH (ref 3.5–5.1)
Sodium: 133 mEq/L — ABNORMAL LOW (ref 136–145)

## 2017-04-10 LAB — CBC AND DIFFERENTIAL
Absolute NRBC: 0.11 10*3/uL — ABNORMAL HIGH
Hematocrit: 36.5 % — ABNORMAL LOW (ref 37.0–47.0)
Hgb: 11.7 g/dL — ABNORMAL LOW (ref 12.0–16.0)
MCH: 22.2 pg — ABNORMAL LOW (ref 28.0–32.0)
MCHC: 32.1 g/dL (ref 32.0–36.0)
MCV: 69.1 fL — ABNORMAL LOW (ref 80.0–100.0)
Nucleated RBC: 0.9 /100 WBC (ref 0.0–1.0)
Platelets: 64 10*3/uL — ABNORMAL LOW (ref 140–400)
RBC: 5.28 10*6/uL (ref 4.20–5.40)
RDW: 18 % — ABNORMAL HIGH (ref 12–15)
WBC: 11.83 10*3/uL — ABNORMAL HIGH (ref 3.50–10.80)

## 2017-04-10 LAB — CELL MORPHOLOGY
Cell Morphology: ABNORMAL — AB
Platelet Estimate: DECREASED — AB

## 2017-04-10 LAB — MAN DIFF ONLY
Band Neutrophils Absolute: 0 10*3/uL (ref 0.00–1.00)
Band Neutrophils: 0 %
Basophils Absolute Manual: 0 10*3/uL (ref 0.00–0.20)
Basophils Manual: 0 %
Eosinophils Absolute Manual: 0 10*3/uL (ref 0.00–0.70)
Eosinophils Manual: 0 %
Lymphocytes Absolute Manual: 1.42 10*3/uL (ref 0.50–4.40)
Lymphocytes Manual: 12 %
Monocytes Absolute: 0.47 10*3/uL (ref 0.00–1.20)
Monocytes Manual: 4 %
Neutrophils Absolute Manual: 9.94 10*3/uL — ABNORMAL HIGH (ref 1.80–8.10)
Segmented Neutrophils: 84 %

## 2017-04-10 LAB — IRON PROFILE
Iron Saturation: 20 % (ref 15–50)
Iron: 35 ug/dL — ABNORMAL LOW (ref 40–145)
TIBC: 178 ug/dL — ABNORMAL LOW (ref 265–497)
UIBC: 143 ug/dL (ref 126–382)

## 2017-04-10 LAB — GFR: EGFR: 7.6

## 2017-04-10 LAB — HEMOLYSIS INDEX: Hemolysis Index: 7 (ref 0–18)

## 2017-04-10 LAB — FERRITIN: Ferritin: 139.34 ng/mL (ref 4.60–204.00)

## 2017-04-10 NOTE — Progress Notes (Signed)
Time:1219  Jacqueline Weber is a 66 y.o. female here for Aranesp injection.  Offers  C/o's of nausea and abdominal pain, she an appointment set up to see her doctor.  Hgb is 11.7 and Hct is 36.5 on 04/10/17.  Aranesp held per parameters.  Please see MAR, VS for more details.  [id number], pt discharged stable, NAD.  RTC 05-08-17    Truddie Coco, RN

## 2017-04-12 ENCOUNTER — Encounter (INDEPENDENT_AMBULATORY_CARE_PROVIDER_SITE_OTHER): Payer: Self-pay

## 2017-04-17 ENCOUNTER — Ambulatory Visit: Payer: Medicare Other

## 2017-04-24 ENCOUNTER — Ambulatory Visit: Payer: Medicare Other

## 2017-04-24 DIAGNOSIS — E1122 Type 2 diabetes mellitus with diabetic chronic kidney disease: Secondary | ICD-10-CM | POA: Insufficient documentation

## 2017-04-24 DIAGNOSIS — I679 Cerebrovascular disease, unspecified: Secondary | ICD-10-CM | POA: Insufficient documentation

## 2017-04-24 DIAGNOSIS — I1310 Hypertensive heart and chronic kidney disease without heart failure, with stage 1 through stage 4 chronic kidney disease, or unspecified chronic kidney disease: Secondary | ICD-10-CM | POA: Insufficient documentation

## 2017-04-25 ENCOUNTER — Inpatient Hospital Stay
Admission: AD | Admit: 2017-04-25 | Discharge: 2017-05-01 | DRG: 682 | Disposition: A | Discharge status: Home Health | Payer: Medicare Other | Source: Ambulatory Visit | Attending: Internal Medicine | Admitting: Internal Medicine

## 2017-04-25 DIAGNOSIS — N179 Acute kidney failure, unspecified: Secondary | ICD-10-CM | POA: Diagnosis present

## 2017-04-25 DIAGNOSIS — I10 Essential (primary) hypertension: Secondary | ICD-10-CM | POA: Diagnosis present

## 2017-04-25 DIAGNOSIS — D631 Anemia in chronic kidney disease: Secondary | ICD-10-CM | POA: Diagnosis present

## 2017-04-25 DIAGNOSIS — K5900 Constipation, unspecified: Secondary | ICD-10-CM | POA: Diagnosis present

## 2017-04-25 DIAGNOSIS — R7989 Other specified abnormal findings of blood chemistry: Secondary | ICD-10-CM | POA: Diagnosis present

## 2017-04-25 DIAGNOSIS — K219 Gastro-esophageal reflux disease without esophagitis: Secondary | ICD-10-CM | POA: Diagnosis present

## 2017-04-25 DIAGNOSIS — I120 Hypertensive chronic kidney disease with stage 5 chronic kidney disease or end stage renal disease: Principal | ICD-10-CM | POA: Diagnosis present

## 2017-04-25 DIAGNOSIS — D509 Iron deficiency anemia, unspecified: Secondary | ICD-10-CM | POA: Diagnosis present

## 2017-04-25 DIAGNOSIS — J069 Acute upper respiratory infection, unspecified: Secondary | ICD-10-CM | POA: Diagnosis present

## 2017-04-25 DIAGNOSIS — Z8673 Personal history of transient ischemic attack (TIA), and cerebral infarction without residual deficits: Secondary | ICD-10-CM

## 2017-04-25 DIAGNOSIS — I2510 Atherosclerotic heart disease of native coronary artery without angina pectoris: Secondary | ICD-10-CM | POA: Diagnosis present

## 2017-04-25 DIAGNOSIS — E7800 Pure hypercholesterolemia, unspecified: Secondary | ICD-10-CM | POA: Diagnosis present

## 2017-04-25 DIAGNOSIS — Z951 Presence of aortocoronary bypass graft: Secondary | ICD-10-CM

## 2017-04-25 DIAGNOSIS — Z992 Dependence on renal dialysis: Secondary | ICD-10-CM

## 2017-04-25 DIAGNOSIS — N186 End stage renal disease: Secondary | ICD-10-CM | POA: Diagnosis present

## 2017-04-25 DIAGNOSIS — J90 Pleural effusion, not elsewhere classified: Secondary | ICD-10-CM | POA: Diagnosis present

## 2017-04-25 DIAGNOSIS — E1122 Type 2 diabetes mellitus with diabetic chronic kidney disease: Secondary | ICD-10-CM | POA: Diagnosis present

## 2017-04-25 DIAGNOSIS — E871 Hypo-osmolality and hyponatremia: Secondary | ICD-10-CM | POA: Diagnosis present

## 2017-04-25 DIAGNOSIS — J209 Acute bronchitis, unspecified: Secondary | ICD-10-CM | POA: Diagnosis present

## 2017-04-25 DIAGNOSIS — I252 Old myocardial infarction: Secondary | ICD-10-CM

## 2017-04-25 DIAGNOSIS — J45909 Unspecified asthma, uncomplicated: Secondary | ICD-10-CM | POA: Diagnosis present

## 2017-04-25 LAB — CBC
Absolute NRBC: 0 10*3/uL
Hematocrit: 23.6 % — ABNORMAL LOW (ref 37.0–47.0)
Hgb: 7.3 g/dL — ABNORMAL LOW (ref 12.0–16.0)
MCH: 22 pg — ABNORMAL LOW (ref 28.0–32.0)
MCHC: 30.9 g/dL — ABNORMAL LOW (ref 32.0–36.0)
MCV: 71.1 fL — ABNORMAL LOW (ref 80.0–100.0)
MPV: 9.2 fL — ABNORMAL LOW (ref 9.4–12.3)
Nucleated RBC: 0 /100 WBC (ref 0.0–1.0)
Platelets: 234 10*3/uL (ref 140–400)
RBC: 3.32 10*6/uL — ABNORMAL LOW (ref 4.20–5.40)
RDW: 16 % — ABNORMAL HIGH (ref 12–15)
WBC: 9.34 10*3/uL (ref 3.50–10.80)

## 2017-04-25 LAB — BASIC METABOLIC PANEL
Anion Gap: 7 (ref 5.0–15.0)
BUN: 62.2 mg/dL — ABNORMAL HIGH (ref 7.0–19.0)
CO2: 20 mEq/L — ABNORMAL LOW (ref 22–29)
Calcium: 7.9 mg/dL — ABNORMAL LOW (ref 8.5–10.5)
Chloride: 109 mEq/L (ref 100–111)
Creatinine: 4.1 mg/dL — ABNORMAL HIGH (ref 0.6–1.0)
Glucose: 141 mg/dL — ABNORMAL HIGH (ref 70–100)
Potassium: 5.1 mEq/L (ref 3.5–5.1)
Sodium: 136 mEq/L (ref 136–145)

## 2017-04-25 LAB — GFR: EGFR: 10.9

## 2017-04-25 MED ORDER — HEPARIN SODIUM (PORCINE) 5000 UNIT/ML IJ SOLN
5000.0000 [IU] | Freq: Three times a day (TID) | INTRAMUSCULAR | Status: DC
Start: 2017-04-25 — End: 2017-05-01
  Administered 2017-04-25 – 2017-05-01 (×17): 5000 [IU] via SUBCUTANEOUS
  Filled 2017-04-25 (×18): qty 1

## 2017-04-25 MED ORDER — POLYETHYLENE GLYCOL 3350 17 G PO PACK
17.0000 g | PACK | Freq: Every day | ORAL | Status: DC
Start: 2017-04-25 — End: 2017-05-01
  Administered 2017-04-25 – 2017-04-29 (×5): 17 g via ORAL
  Filled 2017-04-25 (×7): qty 1

## 2017-04-25 MED ORDER — SIMVASTATIN 10 MG PO TABS
20.0000 mg | ORAL_TABLET | Freq: Every evening | ORAL | Status: DC
Start: 2017-04-25 — End: 2017-05-01
  Administered 2017-04-25 – 2017-04-30 (×6): 20 mg via ORAL
  Filled 2017-04-25 (×6): qty 2

## 2017-04-25 MED ORDER — ASPIRIN 325 MG PO TBEC
325.0000 mg | DELAYED_RELEASE_TABLET | Freq: Every day | ORAL | Status: DC
Start: 2017-04-26 — End: 2017-05-01
  Administered 2017-04-26 – 2017-05-01 (×6): 325 mg via ORAL
  Filled 2017-04-25 (×6): qty 1

## 2017-04-25 MED ORDER — HYDRALAZINE HCL 50 MG PO TABS
100.0000 mg | ORAL_TABLET | Freq: Two times a day (BID) | ORAL | Status: DC
Start: 2017-04-25 — End: 2017-04-28
  Administered 2017-04-25 – 2017-04-28 (×5): 100 mg via ORAL
  Filled 2017-04-25 (×6): qty 2

## 2017-04-25 MED ORDER — AMLODIPINE BESYLATE 5 MG PO TABS
10.0000 mg | ORAL_TABLET | Freq: Every day | ORAL | Status: DC
Start: 2017-04-25 — End: 2017-05-01
  Administered 2017-04-25 – 2017-05-01 (×6): 10 mg via ORAL
  Filled 2017-04-25 (×7): qty 2

## 2017-04-25 MED ORDER — NALOXONE HCL 0.4 MG/ML IJ SOLN (WRAP)
0.2000 mg | INTRAMUSCULAR | Status: DC | PRN
Start: 2017-04-25 — End: 2017-05-01

## 2017-04-25 MED ORDER — BUMETANIDE 1 MG PO TABS
0.5000 mg | ORAL_TABLET | Freq: Every day | ORAL | Status: DC
Start: 2017-04-26 — End: 2017-04-27
  Administered 2017-04-26 – 2017-04-27 (×2): 0.5 mg via ORAL
  Filled 2017-04-25 (×2): qty 1

## 2017-04-25 MED ORDER — FERROUS SULFATE 324 (65 FE) MG PO TBEC
324.0000 mg | DELAYED_RELEASE_TABLET | Freq: Every day | ORAL | Status: DC
Start: 2017-04-26 — End: 2017-04-27
  Administered 2017-04-26 – 2017-04-27 (×2): 324 mg via ORAL
  Filled 2017-04-25 (×2): qty 1

## 2017-04-25 MED ORDER — CARVEDILOL 12.5 MG PO TABS
12.5000 mg | ORAL_TABLET | Freq: Two times a day (BID) | ORAL | Status: DC
Start: 2017-04-25 — End: 2017-05-01
  Administered 2017-04-25 – 2017-05-01 (×12): 12.5 mg via ORAL
  Filled 2017-04-25 (×13): qty 1

## 2017-04-25 MED ORDER — CLONIDINE HCL 0.2 MG PO TABS
0.2000 mg | ORAL_TABLET | Freq: Two times a day (BID) | ORAL | Status: DC
Start: 2017-04-25 — End: 2017-04-27
  Administered 2017-04-25 – 2017-04-27 (×3): 0.2 mg via ORAL
  Filled 2017-04-25 (×4): qty 1

## 2017-04-25 MED ORDER — TRAMADOL HCL 50 MG PO TABS
50.0000 mg | ORAL_TABLET | Freq: Four times a day (QID) | ORAL | Status: DC | PRN
Start: 2017-04-25 — End: 2017-05-01
  Administered 2017-04-26 – 2017-04-27 (×2): 50 mg via ORAL
  Filled 2017-04-25 (×2): qty 1

## 2017-04-25 MED ORDER — SENNOSIDES-DOCUSATE SODIUM 8.6-50 MG PO TABS
2.0000 | ORAL_TABLET | Freq: Two times a day (BID) | ORAL | Status: DC
Start: 2017-04-25 — End: 2017-05-01
  Administered 2017-04-25 – 2017-04-30 (×10): 2 via ORAL
  Filled 2017-04-25 (×12): qty 2

## 2017-04-25 MED ORDER — VITAMIN D 1000 UNITS PO TABS
2.0000 | ORAL_TABLET | Freq: Every day | ORAL | Status: DC
Start: 2017-04-26 — End: 2017-05-01
  Administered 2017-04-26 – 2017-05-01 (×6): 2000 [IU] via ORAL
  Filled 2017-04-25 (×6): qty 2

## 2017-04-25 MED ORDER — ACETAMINOPHEN 325 MG PO TABS
650.0000 mg | ORAL_TABLET | ORAL | Status: DC | PRN
Start: 2017-04-25 — End: 2017-05-01
  Administered 2017-04-27 – 2017-05-01 (×3): 650 mg via ORAL
  Filled 2017-04-25 (×2): qty 2
  Filled 2017-04-25: qty 3

## 2017-04-25 MED ORDER — PANTOPRAZOLE SODIUM 40 MG PO TBEC
40.0000 mg | DELAYED_RELEASE_TABLET | Freq: Every morning | ORAL | Status: DC
Start: 2017-04-26 — End: 2017-05-01
  Administered 2017-04-26 – 2017-05-01 (×6): 40 mg via ORAL
  Filled 2017-04-25 (×6): qty 1

## 2017-04-25 MED ORDER — ONDANSETRON HCL 4 MG/2ML IJ SOLN
4.0000 mg | Freq: Four times a day (QID) | INTRAMUSCULAR | Status: DC | PRN
Start: 2017-04-25 — End: 2017-05-01
  Administered 2017-04-27: 18:00:00 4 mg via INTRAVENOUS
  Filled 2017-04-25: qty 2

## 2017-04-25 MED ORDER — ACETAMINOPHEN 325 MG PO TABS
650.0000 mg | ORAL_TABLET | ORAL | Status: DC | PRN
Start: 2017-04-25 — End: 2017-05-01

## 2017-04-25 NOTE — Plan of Care (Signed)
Problem: Renal Instability  Goal: Fluid and electrolyte balance are achieved/maintained  Outcome: Progressing   04/25/17 1754   Goal/Interventions addressed this shift   Fluid and electrolyte balance are achieved/maintained  Monitor intake and output every shift;Monitor/assess lab values and report abnormal values;Provide adequate hydration;Assess for confusion/personality changes;Assess and reassess fluid and electrolyte status     Goal: Nutritional intake is adequate  Outcome: Progressing   04/25/17 1754   Goal/Interventions addressed this shift   Nutritional intake is adequate Monitor daily weights;Assist patient with meals/food selection;Allow adequate time for meals;Encourage/perform oral hygiene as appropriate;Include patient/patient care companion in decisions related to nutrition       Problem: Safety  Goal: Patient will be free from injury during hospitalization   04/25/17 1754   Goal/Interventions addressed this shift   Patient will be free from injury during hospitalization  Assess patient's risk for falls and implement fall prevention plan of care per policy;Provide and maintain safe environment;Use appropriate transfer methods;Ensure appropriate safety devices are available at the bedside;Hourly rounding;Include patient/ family/ care giver in decisions related to safety       Problem: Pain  Goal: Pain at adequate level as identified by patient   04/25/17 1754   Goal/Interventions addressed this shift   Pain at adequate level as identified by patient Identify patient comfort function goal;Assess for risk of opioid induced respiratory depression, including snoring/sleep apnea. Alert healthcare team of risk factors identified.;Assess pain on admission, during daily assessment and/or before any "as needed" intervention(s);Reassess pain within 30-60 minutes of any procedure/intervention, per Pain Assessment, Intervention, Reassessment (AIR) Cycle;Evaluate if patient comfort function goal is met

## 2017-04-25 NOTE — H&P (Signed)
[facility] HOSPITALIST  H&P  Patient Info:   Date/Time: 04/25/2017 / 7:57 PM   Admit Date:04/25/2017  Patient Name:Jacqueline Weber   UXL:[id number]   PCP: John Giovanni, MD  Attending Physician:Assefi, Shanda Bumps, MD     Assessment and Plan:    Acute on chronic kidney disease--now end-stage renal disease.  Patient has been recently lethargic, with generalized weakness, nausea, vomiting, oliguria with uremic symptoms.  Nephrology plan for hemodialysis tomorrow, already from Lasix this morning and right-sided chest, check complete blood count, BMP in am    History of hypertension--blood pressure stable, continue aquatic, clonidine, Bumex, amlodipine, hydralazine   History of hyperlipidemia on Zocor.   Gastroesophageal reflux disease on Protonix.    anemia, most likely anemia of chronic disease versus iron deficiency anemia --on ferrous sulfate.  Recent iron profile showed iron 35, ferritin 139   History of constipation and senna, MiraLAX at home    Plan of care discussed with the patient, nursing staff  Code status discussed--wanted to be full code, POA patient's daughter Jacqueline Weber    DVT Prohylaxis:heparin tid   Code Status: Full Code  Disposition: home   Type of Admission:Inpatient  Estimated Length of Stay (including stay in the ER receiving treatment): >48 hrs   Milestones required for discharge:Hemodialysis   Hospital Problems:   Active Problems:    End stage renal disease    Clinical Presentation:   History of Presenting Illness:   Jacqueline Weber is a 66 y.o. female who has history of   Past Surgical History:   Procedure Laterality Date   . CORONARY ARTERY BYPASS N/A 06/12/2016    Procedure: Coronary Artery Bypass x 4.;  Surgeon: Bjorn Loser, MD;  Location: [facility] HEART OR;  Service: Cardiothoracic;  Laterality: N/A;  LIMA to LDA  Vein to PDA  Vein to OM  Vein to Diagonal   . EGD, COLONOSCOPY N/A 10/04/2016   . EGD, COLONOSCOPY N/A 10/04/2016    Procedure: EGD w/ bx, COLONOSCOPY w/ bx,  polypectomy;  Surgeon: Norton Blizzard, MD;  Location: [facility] ENDOSCOPY OR;  Service: Gastroenterology;  Laterality: N/A;   . ENDOSCOPIC,VEIN HARVEST N/A 06/12/2016    Procedure: Endoscopic, Left Leg Greater Saphenous Vein Harvest;  Surgeon: Bjorn Loser, MD;  Location: [facility] HEART OR;  Service: Cardiothoracic;  Laterality: N/A;  Left Leg Incision (medially, groin to ankle) @ 0742  Vein out of leg @ 0826  Vein prep complete @ 0845  Total time = 63 minutes    Past Medical History:   Diagnosis Date   . Anemia 10/02/2016    H/O ANEMIA - REFLECTED ON CURRENT LABS.   [name] Cataracts, bilateral     H/O CATARACTS - SURGERY DONE - STILL HAS SOME ISSUES.   . Cerebrovascular accident     H/O STROKE - WEAKNESS ON LEFT SIDE. NO NEUROLOGIST.    [name] Constipation    . Coronary artery disease 06/2016    H/O QUADRUPLE BYPASS SURGERY - FOLLOWED BY [facility].   [name] History of MI (myocardial infarction)    . 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 Hypercholesteremia     CONTROLLED WITH MEDS.   [name] Hypertension     CONTROLLED WITH MEDS.   . Pre-diabetes    . Renal insufficiency     CHRONIC KIDNEY DISEASE - FOLLOWED BY DR. Kate Sable.    came with the chief complaint of No chief complaint on file.  66 year old female with history of  chronic kidney disease, referred  as a direct admit to medical floor for hemodialysis after permanent catheter placement in  right upper chest this morning in [place].  Most of the history is taken from patient's daughter, as pt speaks only loation language.  She complains of recently being very lethargic, with nausea, vomiting, headache, poor urine output.,generalized weakness  She denies fever, chills, abdominal pain, diarrhea or constipation  After arrival.  We have done complete blood count, BMP, noted to have anemia at hemoglobin of 7.3./Hematocrit 23.6, creatinine 4.1, BUN 62  Will admit for hemodialysis in a.m.  Review of Systems:   Review of Systems   Constitutional: Positive for malaise/fatigue. Negative for chills,  fever and weight loss.   HENT: Negative for congestion, ear discharge, ear pain, hearing loss, nosebleeds, sinus pain, sore throat and tinnitus.    Eyes: Negative for blurred vision, double vision, photophobia, pain, discharge and redness.   Respiratory: Positive for shortness of breath. Negative for cough, hemoptysis, sputum production, wheezing and stridor.    Cardiovascular: Positive for leg swelling. Negative for chest pain, palpitations, orthopnea, claudication and PND.   Gastrointestinal: Positive for nausea and vomiting. Negative for abdominal pain, blood in stool, constipation, diarrhea, heartburn and melena.   Genitourinary: Negative for dysuria, flank pain, frequency, hematuria and urgency.        Very less urination    Musculoskeletal: Positive for back pain and myalgias. Negative for falls, joint pain and neck pain.   Neurological: Positive for weakness and headaches. Negative for dizziness, tingling, tremors, sensory change, speech change, focal weakness, seizures and loss of consciousness.   Psychiatric/Behavioral: The patient is nervous/anxious.      Physical Exam:     Vitals:    04/25/17 1714 04/25/17 1744   BP: 192/87    Pulse: 83    Resp: 16    Temp: 98.6 F (37 C)    TempSrc: Oral    SpO2: 96%    Weight:  59.4 kg (131 lb)   Height:  1.524 m (5')     Physical Exam   Patient is awake, alert, oriented 3, not in acute distress.  anasarca noted  HEENT atraumatic, normocephalic, pupils equal, reacting to light.  Extraocular movements intact.  Neck supple.  No thyromegaly.  Chest clear to auscultation bilaterally,perm cath placed today site slightly tender  CVS S1, S2 present.  No murmurs, rubs or gallops  Abdomen soft, nontender, no organomegaly   Bowel sounds present  Extremities 1+ edema both lower extremities.  Neurologic no gross focal deficits  Clinical Information and History:   Chief Complaint:No chief complaint on file.    Past Medical History:  Past Medical History:   Diagnosis Date   .  Anemia 10/02/2016    H/O ANEMIA - REFLECTED ON CURRENT LABS.   [name] Cataracts, bilateral     H/O CATARACTS - SURGERY DONE - STILL HAS SOME ISSUES.   . Cerebrovascular accident     H/O STROKE - WEAKNESS ON LEFT SIDE. NO NEUROLOGIST.    [name] Constipation    . Coronary artery disease 06/2016    H/O QUADRUPLE BYPASS SURGERY - FOLLOWED BY [facility].   [name] History of MI (myocardial infarction)    . 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 Hypercholesteremia     CONTROLLED WITH MEDS.   [name] Hypertension     CONTROLLED WITH MEDS.   . Pre-diabetes    . Renal insufficiency     CHRONIC  KIDNEY DISEASE - FOLLOWED BY DR. Kate Sable.     Past Surgical History:  Past Surgical History:   Procedure Laterality Date   . CORONARY ARTERY BYPASS N/A 06/12/2016    Procedure: Coronary Artery Bypass x 4.;  Surgeon: Bjorn Loser, MD;  Location: [facility] HEART OR;  Service: Cardiothoracic;  Laterality: N/A;  LIMA to LDA  Vein to PDA  Vein to OM  Vein to Diagonal   . EGD, COLONOSCOPY N/A 10/04/2016   . EGD, COLONOSCOPY N/A 10/04/2016    Procedure: EGD w/ bx, COLONOSCOPY w/ bx, polypectomy;  Surgeon: Norton Blizzard, MD;  Location: [facility] ENDOSCOPY OR;  Service: Gastroenterology;  Laterality: N/A;   . ENDOSCOPIC,VEIN HARVEST N/A 06/12/2016    Procedure: Endoscopic, Left Leg Greater Saphenous Vein Harvest;  Surgeon: Bjorn Loser, MD;  Location: [facility] HEART OR;  Service: Cardiothoracic;  Laterality: N/A;  Left Leg Incision (medially, groin to ankle) @ 0742  Vein out of leg @ 0826  Vein prep complete @ 0845  Total time = 63 minutes     Family History:  Family History   Problem Relation Age of Onset   . No known problems Mother    . No known problems Father      Social History:  History   Alcohol Use No     History   Drug Use No     History   Smoking Status   . Never Smoker   Smokeless Tobacco   . Never Used     Social History     Social History   . Marital status: Widowed     Spouse name: N/A   . Number of children: N/A   . Years of education: N/A     Social History  Main Topics   . Smoking status: Never Smoker   . Smokeless tobacco: Never Used   . Alcohol use No   . Drug use: No   . Sexual activity: Not Asked     Other Topics Concern   . None     Social History Narrative   . None     Allergies:  Allergies   Allergen Reactions   . Mosquito (Culex Pipiens) Allergy Skin Test Shortness Of Breath     And  Trouble  breathing   . Lisinopril    . Shellfish-Derived Products Hives     New  allergy  New  allergy   . Penicillins Rash     Swelling,  numbness     Medications:  Prescriptions Prior to Admission   Medication Sig Dispense Refill Last Dose   . acetaminophen (TYLENOL) 325 MG tablet Take 2 tablets (650 mg total) by mouth every 4 (four) hours as needed for Pain or Fever.   04/25/2017 at Unknown time   . amLODIPine (NORVASC) 10 MG tablet Take 1 tablet (10 mg total) by mouth daily. 90 tablet 1 04/24/2017 at Unknown time   . aspirin EC 325 MG EC tablet Take 1 tablet (325 mg total) by mouth daily. 30 tablet 0 04/25/2017 at Unknown time   . bumetanide (BUMEX) 0.5 MG tablet TAKE ONE TABLET BY MOUTH ONCE DAILY 90 tablet 1 04/25/2017 at Unknown time   . carvedilol (COREG) 25 MG tablet 12.5 mg 2 (two) times daily with meals.       04/25/2017 at Unknown time   . Cholecalciferol (VITAMIN D3) 2000 units Tab Take 4,000 IU by mouth daily.   04/25/2017 at Unknown time   . cloNIDine (CATAPRES) 0.2  MG tablet Take 1 tablet (0.2 mg total) by mouth 2 (two) times daily. 60 tablet 0 04/25/2017 at Unknown time   . ferrous sulfate 324 (65 FE) MG Tablet Delayed Response TAKE ONE TABLET BY MOUTH IN THE MORNING WITH BREAKFAST 90 tablet 1 04/25/2017 at Unknown time   . hydrALAZINE (APRESOLINE) 100 MG tablet Take 1 tablet (100 mg total) by mouth 3 (three) times daily. (Patient taking differently: Take 100 mg by mouth 2 (two) times daily.    ) 270 tablet 1 04/25/2017 at Unknown time   . ondansetron (ZOFRAN-ODT) 4 MG disintegrating tablet Take 1 tablet (4 mg total) by mouth every 6 (six) hours as needed for Nausea. 20  tablet 3 Past Week at Unknown time   . pantoprazole (PROTONIX) 40 MG tablet TAKE ONE TABLET BY MOUTH IN THE MORNING BEFORE BREAKFAST 90 tablet 1 04/25/2017 at Unknown time   . senna-docusate (PERICOLACE) 8.6-50 MG per tablet Take 2 tablets by mouth 2 (two) times daily. 30 tablet 0 04/24/2017 at Unknown time   . simvastatin (ZOCOR) 20 MG tablet Take 20 mg by mouth nightly.       04/24/2017 at Unknown time   . polyethylene glycol (MIRALAX) packet Take 17 g by mouth daily. 30 each 3 Unknown at Unknown time   . traMADol (ULTRAM) 50 MG tablet Take 50 mg by mouth every 6 (six) hours as needed for Pain.   Unknown at Unknown time     Results of Labs/imaging   Labs have been reviewed:   Coagulation Profile:       CBC review:   Recent Labs  Lab 04/25/17  1843   WBC 9.34   Hgb 7.3*   Hematocrit 23.6*   Platelets 234   MCV 71.1*   RDW 16*     Chem Review:  Recent Labs  Lab 04/25/17  1843   Sodium 136   Potassium 5.1   Chloride 109   CO2 20*   BUN 62.2*   Creatinine 4.1*   Glucose 141*   Calcium 7.9*     Results     Procedure Component Value Units Date/Time    Basic Metabolic Panel [[id number]]  (Abnormal) Collected:  04/25/17 1843    Specimen:  Blood Updated:  04/25/17 1908     Glucose 141 (H) mg/dL      BUN 62.2 (H) mg/dL      Creatinine 4.1 (H) mg/dL      Calcium 7.9 (L) mg/dL      Sodium 136 mEq/L      Potassium 5.1 mEq/L      Chloride 109 mEq/L      CO2 20 (L) mEq/L      Anion Gap 7.0    GFR [[id number]] Collected:  04/25/17 1843     Updated:  04/25/17 1908     EGFR 10.9    CBC without differential [[id number]]  (Abnormal) Collected:  04/25/17 1843    Specimen:  Blood from Blood Updated:  04/25/17 1850     WBC 9.34 x10 3/uL      Hgb 7.3 (L) g/dL      Hematocrit 23.6 (L) %      Platelets 234 x10 3/uL      RBC 3.32 (L) x10 6/uL      MCV 71.1 (L) fL      MCH 22.0 (L) pg      MCHC 30.9 (L) g/dL      RDW 16 (H) %      MPV  9.2 (L) fL      Nucleated RBC 0.0 /100 WBC      Absolute NRBC 0.00 x10 3/uL         Radiology reports have been  reviewed:  Radiology Results (24 Hour)     ** No results found for the last 24 hours. **        EKG: EKG reviewed   Last EKG Result     None           Hospitalist   Signed by:   Clayborne Artist  04/25/2017 7:57 PM    *This note was generated by the Epic EMR system/ Dragon speech recognition and may contain inherent errors or omissions not intended by the user. Grammatical errors, random word insertions, deletions, pronoun errors and incomplete sentences are occasional consequences of this technology due to software limitations. Not all errors are caught or corrected. If there are questions or concerns about the content of this note or information contained within the body of this dictation they should be addressed directly with the author for clarification

## 2017-04-25 NOTE — Progress Notes (Signed)
Direct admit arrived to unit with daughter at bedside. BP elevated. Attending paged. Awaiting for patient to be assigned to hospitalist or have attending place orders. Nonskid footwear placed. Bed alarm activated. Dressing to right chest wall is CDI. Patient does complain of some pain at that site. Resource RN to complete admission.     Lungs slightly course sounding. Rhonchi at the bases. Patient asked to cough to clear secretions. Lungs sounds improved.     Hospitalist called to placed orders for patient. Per Dr. Kate Sable, he will arrange for dialysis tomorrow.

## 2017-04-26 ENCOUNTER — Inpatient Hospital Stay: Payer: Medicare Other

## 2017-04-26 ENCOUNTER — Encounter (INDEPENDENT_AMBULATORY_CARE_PROVIDER_SITE_OTHER): Payer: Self-pay | Admitting: Internal Medicine

## 2017-04-26 DIAGNOSIS — J209 Acute bronchitis, unspecified: Secondary | ICD-10-CM | POA: Diagnosis present

## 2017-04-26 DIAGNOSIS — Z8673 Personal history of transient ischemic attack (TIA), and cerebral infarction without residual deficits: Secondary | ICD-10-CM

## 2017-04-26 DIAGNOSIS — I10 Essential (primary) hypertension: Secondary | ICD-10-CM

## 2017-04-26 LAB — CBC AND DIFFERENTIAL
Absolute NRBC: 0 10*3/uL
Basophils Absolute Automated: 0.05 10*3/uL (ref 0.00–0.20)
Basophils Automated: 0.6 %
Eosinophils Absolute Automated: 0.26 10*3/uL (ref 0.00–0.70)
Eosinophils Automated: 3 %
Hematocrit: 23.6 % — ABNORMAL LOW (ref 37.0–47.0)
Hgb: 7.5 g/dL — ABNORMAL LOW (ref 12.0–16.0)
Immature Granulocytes Absolute: 0.02 10*3/uL
Immature Granulocytes: 0.2 %
Lymphocytes Absolute Automated: 2.12 10*3/uL (ref 0.50–4.40)
Lymphocytes Automated: 24.1 %
MCH: 22.4 pg — ABNORMAL LOW (ref 28.0–32.0)
MCHC: 31.8 g/dL — ABNORMAL LOW (ref 32.0–36.0)
MCV: 70.4 fL — ABNORMAL LOW (ref 80.0–100.0)
MPV: 10.2 fL (ref 9.4–12.3)
Monocytes Absolute Automated: 0.47 10*3/uL (ref 0.00–1.20)
Monocytes: 5.4 %
Neutrophils Absolute: 5.86 10*3/uL (ref 1.80–8.10)
Neutrophils: 66.7 %
Nucleated RBC: 0 /100 WBC (ref 0.0–1.0)
Platelets: 250 10*3/uL (ref 140–400)
RBC: 3.35 10*6/uL — ABNORMAL LOW (ref 4.20–5.40)
RDW: 16 % — ABNORMAL HIGH (ref 12–15)
WBC: 8.78 10*3/uL (ref 3.50–10.80)

## 2017-04-26 LAB — HEMOLYSIS INDEX: Hemolysis Index: 2 (ref 0–18)

## 2017-04-26 LAB — GFR: EGFR: 10.9

## 2017-04-26 LAB — HEPATITIS C ANTIBODY: Hepatitis C, AB: NONREACTIVE

## 2017-04-26 LAB — HEPATITIS B SURFACE ANTIBODY: HEPATITIS B SURFACE ANTIBODY: 23.36

## 2017-04-26 LAB — BASIC METABOLIC PANEL
Anion Gap: 5 (ref 5.0–15.0)
BUN: 58.8 mg/dL — ABNORMAL HIGH (ref 7.0–19.0)
CO2: 22 mEq/L (ref 22–29)
Calcium: 8.2 mg/dL — ABNORMAL LOW (ref 8.5–10.5)
Chloride: 109 mEq/L (ref 100–111)
Creatinine: 4.1 mg/dL — ABNORMAL HIGH (ref 0.6–1.0)
Glucose: 123 mg/dL — ABNORMAL HIGH (ref 70–100)
Potassium: 5.5 mEq/L — ABNORMAL HIGH (ref 3.5–5.1)
Sodium: 136 mEq/L (ref 136–145)

## 2017-04-26 LAB — HEPATITIS B SURFACE ANTIGEN W/ REFLEX TO CONFIRMATION: Hepatitis B Surface Antigen: NONREACTIVE

## 2017-04-26 LAB — GLUCOSE WHOLE BLOOD - POCT
Whole Blood Glucose POCT: 169 mg/dL — ABNORMAL HIGH (ref 70–100)
Whole Blood Glucose POCT: 189 mg/dL — ABNORMAL HIGH (ref 70–100)

## 2017-04-26 LAB — HEPATITIS B CORE ANTIBODY, TOTAL: Hepatitis B Core Total AB: NONREACTIVE

## 2017-04-26 LAB — MAGNESIUM: Magnesium: 2.5 mg/dL (ref 1.6–2.6)

## 2017-04-26 LAB — PHOSPHORUS: Phosphorus: 4.2 mg/dL (ref 2.3–4.7)

## 2017-04-26 MED ORDER — HEPARIN SODIUM (PORCINE) 1000 UNIT/ML IJ SOLN
1000.0000 [IU] | INTRAMUSCULAR | Status: AC | PRN
Start: 2017-04-26 — End: 2017-04-26
  Filled 2017-04-26: qty 1

## 2017-04-26 MED ORDER — INSULIN LISPRO 100 UNIT/ML SC SOLN
1.0000 [IU] | Freq: Three times a day (TID) | SUBCUTANEOUS | Status: DC | PRN
Start: 2017-04-26 — End: 2017-05-01
  Administered 2017-04-27 – 2017-04-28 (×2): 1 [IU] via SUBCUTANEOUS
  Administered 2017-04-29 – 2017-04-30 (×4): 2 [IU] via SUBCUTANEOUS
  Filled 2017-04-26 (×2): qty 3
  Filled 2017-04-26 (×4): qty 6

## 2017-04-26 MED ORDER — INSULIN LISPRO 100 UNIT/ML SC SOLN
1.0000 [IU] | Freq: Every evening | SUBCUTANEOUS | Status: DC | PRN
Start: 2017-04-26 — End: 2017-05-01
  Administered 2017-04-26 – 2017-04-28 (×2): 1 [IU] via SUBCUTANEOUS
  Administered 2017-04-29 – 2017-05-01 (×2): 2 [IU] via SUBCUTANEOUS
  Filled 2017-04-26: qty 3
  Filled 2017-04-26 (×3): qty 6

## 2017-04-26 MED ORDER — GLUCOSE 40 % PO GEL
15.0000 g | ORAL | Status: DC | PRN
Start: 2017-04-26 — End: 2017-05-01

## 2017-04-26 MED ORDER — GLUCAGON 1 MG IJ SOLR (WRAP)
1.0000 mg | INTRAMUSCULAR | Status: DC | PRN
Start: 2017-04-26 — End: 2017-05-01

## 2017-04-26 MED ORDER — ALBUTEROL-IPRATROPIUM 2.5-0.5 (3) MG/3ML IN SOLN
3.0000 mL | Freq: Four times a day (QID) | RESPIRATORY_TRACT | Status: DC
Start: 2017-04-26 — End: 2017-05-01
  Administered 2017-04-26 – 2017-05-01 (×19): 3 mL via RESPIRATORY_TRACT
  Filled 2017-04-26 (×15): qty 3

## 2017-04-26 MED ORDER — SODIUM CHLORIDE 0.9 % IV BOLUS
100.0000 mL | INTRAVENOUS | Status: AC | PRN
Start: 2017-04-26 — End: 2017-04-26

## 2017-04-26 MED ORDER — DARBEPOETIN ALFA 60 MCG/0.3ML IJ SOSY
60.0000 ug | PREFILLED_SYRINGE | INTRAMUSCULAR | Status: DC
Start: 2017-04-26 — End: 2017-05-01
  Administered 2017-04-27: 09:00:00 60 ug via SUBCUTANEOUS
  Filled 2017-04-26: qty 0.3

## 2017-04-26 MED ORDER — ALBUMIN HUMAN 25 % IV SOLN
100.0000 mL | INTRAVENOUS | Status: AC | PRN
Start: 2017-04-26 — End: 2017-04-26
  Filled 2017-04-26: qty 100

## 2017-04-26 MED ORDER — AZITHROMYCIN 250 MG PO TABS
500.0000 mg | ORAL_TABLET | Freq: Every day | ORAL | Status: AC
Start: 2017-04-26 — End: 2017-04-30
  Administered 2017-04-26 – 2017-04-30 (×5): 500 mg via ORAL
  Filled 2017-04-26 (×5): qty 2

## 2017-04-26 MED ORDER — RISAQUAD PO CAPS
1.0000 | ORAL_CAPSULE | Freq: Every day | ORAL | Status: DC
Start: 2017-04-26 — End: 2017-05-01
  Administered 2017-04-26 – 2017-05-01 (×6): 1 via ORAL
  Filled 2017-04-26 (×6): qty 1

## 2017-04-26 MED ORDER — SODIUM CHLORIDE 0.9 % IV BOLUS
250.0000 mL | INTRAVENOUS | Status: AC | PRN
Start: 2017-04-26 — End: 2017-04-26

## 2017-04-26 MED ORDER — FLUTICASONE-SALMETEROL 100-50 MCG/DOSE IN AEPB
1.0000 | INHALATION_SPRAY | Freq: Two times a day (BID) | RESPIRATORY_TRACT | Status: DC
Start: 2017-04-26 — End: 2017-05-01
  Administered 2017-04-26 – 2017-05-01 (×10): 1 via RESPIRATORY_TRACT
  Filled 2017-04-26: qty 14

## 2017-04-26 MED ORDER — GUAIFENESIN ER 600 MG PO TB12
600.0000 mg | ORAL_TABLET | Freq: Two times a day (BID) | ORAL | Status: DC
Start: 2017-04-26 — End: 2017-05-01
  Administered 2017-04-26 – 2017-05-01 (×11): 600 mg via ORAL
  Filled 2017-04-26 (×11): qty 1

## 2017-04-26 MED ORDER — DEXTROSE 50 % IV SOLN
12.5000 g | INTRAVENOUS | Status: DC | PRN
Start: 2017-04-26 — End: 2017-05-01

## 2017-04-26 NOTE — Progress Notes (Signed)
HD CARE COORDINATOR NOTE - email referral from [facility], April Manson. Patient is new to dialysis, OP HD arrangements are planned.  Quick review of chart shows all Hep labs have been drawn, will need EKG - hosp CC, Mickel Baas notified.  Writer to speak with pt's daughter tomorrow to facilitate placement.     Freida Busman, HD Care Coordinator  [phone]

## 2017-04-26 NOTE — UM Notes (Signed)
Pnt presented as direct admit to med surg unit as inpatient on 04/25/17    66 year old female with history of chronic kidney disease, referred  as a direct admit to medical floor for hemodialysis after permanent catheter placement in  right upper chest this morning in [place].  Most of the history is taken from patient's daughter, as pt speaks only loation language.  She complains of recently being very lethargic, with nausea, vomiting, headache, poor urine output.,generalized weakness  She denies fever, chills, abdominal pain, diarrhea or constipation  After arrival.  We have done complete blood count, BMP, noted to have anemia at hemoglobin of 7.3./Hematocrit 23.6, creatinine 4.1, BUN 62  Will admit for hemodialysis in a.m.    Past Medical History:   Diagnosis Date   . Anemia 10/02/2016    H/O ANEMIA - REFLECTED ON CURRENT LABS.   [name] Cataracts, bilateral     H/O CATARACTS - SURGERY DONE - STILL HAS SOME ISSUES.   . Cerebrovascular accident     H/O STROKE - WEAKNESS ON LEFT SIDE. NO NEUROLOGIST.    [name] Constipation    . Coronary artery disease 06/2016    H/O QUADRUPLE BYPASS SURGERY - FOLLOWED BY [facility].   [name] History of MI (myocardial infarction)    . 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 Hypercholesteremia     CONTROLLED WITH MEDS.   [name] Hypertension     CONTROLLED WITH MEDS.   . Pre-diabetes    . Renal insufficiency     CHRONIC KIDNEY DISEASE - FOLLOWED BY DR. Kate Sable.     Past Surgical History:   Procedure Laterality Date   . CORONARY ARTERY BYPASS N/A 06/12/2016    Procedure: Coronary Artery Bypass x 4.;  Surgeon: Bjorn Loser, MD;  Location: [facility] HEART OR;  Service: Cardiothoracic;  Laterality: N/A;  LIMA to LDA  Vein to PDA  Vein to OM  Vein to Diagonal   . EGD, COLONOSCOPY N/A 10/04/2016   . EGD, COLONOSCOPY N/A 10/04/2016    Procedure: EGD w/ bx, COLONOSCOPY w/ bx, polypectomy;  Surgeon: Norton Blizzard, MD;  Location: [facility] ENDOSCOPY OR;  Service: Gastroenterology;  Laterality: N/A;   . ENDOSCOPIC,VEIN HARVEST  N/A 06/12/2016    Procedure: Endoscopic, Left Leg Greater Saphenous Vein Harvest;  Surgeon: Bjorn Loser, MD;  Location: [facility] HEART OR;  Service: Cardiothoracic;  Laterality: N/A;  Left Leg Incision (medially, groin to ankle) @ 0742  Vein out of leg @ 0826  Vein prep complete @ 0845  Total time = 63 minutes     VS: T 98.6, p 83, SATS 96%, resp 16, bp 192/87    Abnormal Labs: hgb 7.3, hct 23.6, rbc 3.32, glucose 141, bun 62.2, cr 4.1, co2 20, cal 7.9,     Patient transferred to med surg unit as inpatient on 04/25/17 for ESRD.    Assessment and Plan per MD:   Acute on chronic kidney disease--now end-stage renal disease.  Patient has been recently lethargic, with generalized weakness, nausea, vomiting, oliguria with uremic symptoms.  Nephrology plan for hemodialysis tomorrow, already from Lasix this morning and right-sided chest, check complete blood count, BMP in am    History of hypertension--blood pressure stable, continue aquatic, clonidine, Bumex, amlodipine, hydralazine   History of hyperlipidemia on Zocor.   Gastroesophageal reflux disease on Protonix.    anemia, most likely anemia of chronic disease versus iron deficiency anemia --on ferrous sulfate.  Recent iron profile showed iron  35, ferritin 139   History of constipation and senna, MiraLAX at home    Orders:  Current Facility-Administered Medications   Medication Dose Route Frequency Last Rate Last Dose   . acetaminophen (TYLENOL) tablet 650 mg  650 mg Oral Q4H PRN       . acetaminophen (TYLENOL) tablet 650 mg  650 mg Oral Q4H PRN       . amLODIPine (NORVASC) tablet 10 mg  10 mg Oral Daily   10 mg at 04/25/17 2205   . aspirin EC EC tablet 325 mg  325 mg Oral Daily       . bumetanide (BUMEX) tablet 0.5 mg  0.5 mg Oral Daily       . carvedilol (COREG) tablet 12.5 mg  12.5 mg Oral BID Meals   12.5 mg at 04/25/17 2100   . cloNIDine (CATAPRES) tablet 0.2 mg  0.2 mg Oral BID   0.2 mg at 04/25/17 2100   . ferrous sulfate EC tablet 324 mg  324 mg Oral Daily        . heparin (porcine) injection 5,000 Units  5,000 Units Subcutaneous Q8H [place]   5,000 Units at 04/26/17 0541   . hydrALAZINE (APRESOLINE) tablet 100 mg  100 mg Oral BID   100 mg at 04/25/17 2205   . naloxone [facility]) injection 0.2 mg  0.2 mg Intravenous PRN       . ondansetron (ZOFRAN) injection 4 mg  4 mg Intravenous Q6H PRN       . pantoprazole (PROTONIX) EC tablet 40 mg  40 mg Oral QAM AC       . polyethylene glycol (MIRALAX) packet 17 g  17 g Oral Daily   17 g at 04/25/17 2059   . senna-docusate (PERICOLACE) 8.6-50 MG per tablet 2 tablet  2 tablet Oral BID   2 tablet at 04/25/17 2059   . simvastatin (ZOCOR) tablet 20 mg  20 mg Oral QHS   20 mg at 04/25/17 2206   . traMADol (ULTRAM) tablet 50 mg  50 mg Oral Q6H PRN       . vitamin D (cholecalciferol) tablet 2,000 Units  2 tablet Oral Daily         Renal diet  I/O  O2 5 lpm or less  Tele  VS q 4 hrs

## 2017-04-26 NOTE — Progress Notes (Signed)
Nutritional Support Services  Nutrition Screening    Jacqueline Weber 66 y.o. female                                 MRN: [id number]    Referral Source: Nsng  Reason for Referral: from [facility] on admit for reported 2-13# weight loss     Chart reviewed. Labs & meds reviewed. Pt out of room for X-ray. Dtr in room and reports that they do not know how much weight pt has lost but thought she might as she was not feeling well and not eat much prior to admit. Per wt hx in Epic, pts weight in January was 131.8# and is now 130.7#, indicating weight stability and no recent acute weight loss. Made dtr aware that pts kidney failure labs were high enough to affect pts appetite (BUN elevated and low GFR) and that she will start feeling better once HD is initiated.     Provided dtr w/ handout on CKD-5 for Dialysis and went over it with her discussing K, Na, phos and protein. Dtr expressed appreciation for this information. Informed dtr that pt will be followed closely by a dietitian at the dialysis unit but this education will help her and family initially to make necessary changes in diet. No further Nutrition intervention indicated at this time. Will continue to monitor and f/u PRN.    Bevelyn Buckles, MPH, RDN  Clinical Nutrition Manager  SL X [phone]  Office: [phone]

## 2017-04-26 NOTE — Consults (Signed)
8/23 4:25 pm: Referral sent to Freida Busman, Dialysis Liaison with [facility]. CM made liaison aware that dialysis is to be arranged with [facility]. Hep B/Hep C lab orders completed. CXR completed. CM awaiting details from Liaison.

## 2017-04-26 NOTE — Procedures (Signed)
Pre Treatment-The patient was transported to the HD room via the HD nurse and a PCT.  The patient was accompanied by her daughter.  Right Perm Cath dressing packed with gauzes and tape.  The dressing was dry, intact, clean and patent.  Moist non-productive coughing.  No B/P is to be taken with the patient's left arm (for creation of a fistular).   The patient's blood K+ level of 5.5 and she ran on a 2 K+ bathe as per protocol.    Treatment-Today is the patient first day of HD treatment.  Good QB flow at 200 ml per minute.  The patient tolerated the treatment.    Post Treatment-Removed 1.2 L of fluid.  A new Right Perm Cath dressing applied with the assistance of Presenter, broadcasting.  The new dressing is dry, intact, clean and patent.    Report given to [place]  Kendra Opitz RN

## 2017-04-26 NOTE — Plan of Care (Signed)
Ask3Teach3 Program    Education about New Medications and their Side effects    Dear Jacqueline Weber,    Its been a pleasure taking care of you during your hospitalization here at [facility]. We have initiated a new program to educate our patients and/or their family members or designated personnel about the new medications started by your physicians and their indications along with the possible side effects. Multiple studies have shown that patients started on new medications are often unaware of the names of the medication along with the indications and their side effects which leads to decreased compliance with the medications.    During our conversation today on 04/26/2017  9:51 PM I have explained to you the name of the new medication and the indication along with some possible common side effects. Listed below are some of the new medications started during this hospitalization.     Please call the Nurse if you have any side effects while in hospital.     Please call [phone] if you have any life threatening symptoms after you are discharged from the hospital.    Please inform your Primary care physician for common side effects which are not life threatening after discharge.    Albuterol/Ipratropium (Duoneb) Asthma, COPD Headache, cough, nervousness, upper airway infection     Medication Name: Fluticasone(Flonase)   This Medication is used for:   COPD   Asthma   Bronchospasm    Common Side Effects are:   Fungal Infections of the mouth and throat   Sore throat   Hoarseness   Headache    A note from your nurse:  Call your nurse immediately if you notice itching, hives, swelling or trouble breathing     Thank you for your time.    Flo Shanks, RT  04/26/2017  9:51 PM  [facility]  [address]  [place], [place]  [id number]

## 2017-04-26 NOTE — Progress Notes (Signed)
Pt resting in bed, No distress noted, Report given to night shift RN.

## 2017-04-26 NOTE — Progress Notes (Signed)
8/23 RNCM: admitted with ESRD. Pt sent to hospital for HD after perm cath placed in R chest. Pt was a direct admit. Pt to have HD today. CM met with pt and daughter in room. Pt does not speak [place]. CM spoke with daughter, [name].  Pt lives with daughter in [place]. Daughter stays home and does not work. Daughter is primary care taker of pt. CM discussed HH and daughter, dtr states if physician feels pt needs services then they will accept otherwise, they decline.        04/26/17 1353   Patient Type   Within 30 Days of Previous Admission? No   Healthcare Decisions   Interviewed: Family   Name of interviewee if other than the pt: Daughter [name]   Environmental manager Information: [phone]   Orientation/Decision Making Abilities of Patient Alert and Oriented x3, able to make decisions   Advance Directive Patient does not have advance directive   Healthcare Agent Appointed No   Prior to admission   Prior level of function Independent with ADLs;Ambulates independently   Type of Residence Private residence   Home Layout Bed/bath upstairs;Multi-level   Have running water, electricity, heat, etc? Yes   Living Arrangements Children   How do you get to your MD appointments? daughter   How do you get your groceries? daughter   Who fixes your meals? daughter or self   Who does your laundry? daughter or self   Who picks up your prescriptions? daughter   Dressing Independent   Grooming Independent   Feeding [place]   DME Currently at Home (NO DME, daughter states pt holds onto items in house when walking, hallways are very narrow and equipment won't fit)   Adult Protective Services (APS) involved? No   Discharge [place]   Patient expects to be discharged to: home   Anticipated [place] plan discussed with: Family   Mode of transportation: Private car (family member)   Consults/Providers   Correct PCP listed in Epic? Yes   Mickel Baas Allison-McCloud, Rn,  BSN, CM  Case Manager x [phone]

## 2017-04-26 NOTE — Progress Notes (Addendum)
[facility] Hospitalist Daily Progress Note        Date Time: 04/26/2017  1:52 PM  Patient Name:Jacqueline Weber  SCB:[id number]  PCP: John Giovanni, MD  Attending Physician:Feliz Herard Sherre Poot M.D.      Chief Complaint:    Kidney failure    Subjective:     Jacqueline Weber today has no new complaints.  Complains of cough which has been ongoing for many months now.  Denies any chest pain or shortness of breath  Denies any abdominal pain - No Nausea, vomiting or diarrhea  No new weakness tingling or numbness.  Assessment/Plan   End-stage renal disease with uremic symptoms : Hemodialysis to be initiated by nephrology today  Continue supportive and symptomatic treatment  Cough ongoing for many months.  Chest x-ray shows bilateral pleural effusion: Possible bronchitis  Starting azithromycin, Advair, and administer nebulizers as needed.  Start Mucinex  Hopefully hemodialysis will help with the pleural effusion  History of coronary artery disease and recent coronary artery bypass graft on June 12, 2016   Continue aspirin, beta blocker and statin  History of CVA in 2014 with mild left lower extremity weakness :  At baseline. Continue aspirin, statin  Diet-controlled diabetes mellitus : Hemoglobin A1c in June was 6.2  Start low-dose sliding scale and diabetic diet  Essential hypertension: Continue current medications including Norvasc, Bumex, Coreg, clonidine and hydralazine  Anemia of chronic kidney disease : Continue ferrous sulfate, monitor hemoglobin  Dyslipidemia :Continue statin  Gastroesophageal reflux disease: Continue PPI      DVT Prohylaxis: Heparin   Code Status: Full Code   Prognosis: Guarded  Type of Admission:Inpatient  Central Line/Foley Catheter/PICC line status: none  Estimated Length of Stay (including stay in the ER receiving treatment): > 2 midnights  Medical Necessity for stay: End-stage renal disease  Expected Date of Discharge:  2- 3 days   Allergies:      Allergies   Allergen Reactions   . Mosquito (Culex Pipiens) Allergy Skin Test Shortness Of Breath     And  Trouble  breathing   . Lisinopril    . Shellfish-Derived Products Hives     New  allergy  New  allergy   . Penicillins Rash     Swelling,  numbness       Physical Exam:   Vitals reviewed:   height is 1.524 m (5') and weight is 59.4 kg (131 lb). Her oral temperature is 98.8 F (37.1 C). Her blood pressure is 149/66 and her pulse is 65. Her respiration is 18 and oxygen saturation is 96%.   Body mass index is 25.58 kg/m.  Vitals:    04/26/17 0150 04/26/17 0544 04/26/17 0848 04/26/17 1339   BP: 144/72 168/73 165/75 149/66   Pulse: 77 73 78 65   Resp: 18 16 18 18    Temp: 98.2 F (36.8 C) 97.9 F (36.6 C) 98.6 F (37 C) 98.8 F (37.1 C)   TempSrc: Temporal Artery   Oral   SpO2: 98% 98% 96% 96%   Weight:       Height:         Intake and Output Summary (Last 24 hours) at Date Time    Intake/Output Summary (Last 24 hours) at 04/26/17 1352  Last data filed at 04/26/17 1024   Gross per 24 hour   Intake              720 ml   Output  1150 ml   Net             -430 ml       Exam  Awake Alert, Oriented *3, No new F.N deficits, Normal affect  NC.AT,PERRAL  Supple Neck,No JVD, No cervical lymphadenopathy appriciated.   Symmetrical Chest wall movement, Good air movement bilaterally, bibasilar crackles and mild expiratory rhonchi bilaterally  RRR,No Gallops,Rubs or new Murmurs, No Parasternal Heave  +ve B.Sounds, Abd Soft, Non tender, No organomegaly appriciated, No rebound -guarding or rigidity.  Trace bilateral lower extremity edema, No new Rash or bruise    Consult Input/Plan     Nephrology    Medications:     Current Facility-Administered Medications   Medication Dose Route Frequency Last Rate Last Dose   . acetaminophen (TYLENOL) tablet 650 mg  650 mg Oral Q4H PRN       . acetaminophen (TYLENOL) tablet 650 mg  650 mg Oral Q4H PRN       . albumin human 25 % bag 100 mL  100 mL Intravenous PRN       .  amLODIPine (NORVASC) tablet 10 mg  10 mg Oral Daily   10 mg at 04/25/17 2205   . aspirin EC EC tablet 325 mg  325 mg Oral Daily   325 mg at 04/26/17 1022   . bumetanide (BUMEX) tablet 0.5 mg  0.5 mg Oral Daily   0.5 mg at 04/26/17 1021   . carvedilol (COREG) tablet 12.5 mg  12.5 mg Oral BID Meals   12.5 mg at 04/26/17 0900   . cloNIDine (CATAPRES) tablet 0.2 mg  0.2 mg Oral BID   0.2 mg at 04/25/17 2100   . darbepoetin alfa (ARANESP) injection 60 mcg  60 mcg Subcutaneous Weekly       . ferrous sulfate EC tablet 324 mg  324 mg Oral Daily   324 mg at 04/26/17 1021   . fluticasone-salmeterol (ADVAIR DISKUS) 100-50 MCG/DOSE 1 puff  1 puff Inhalation BID       . guaiFENesin (MUCINEX) 12 hr tablet 600 mg  600 mg Oral Q12H [place]       . heparin (porcine) injection 1,000 Units  1,000 Units Intracatheter PRN       . heparin (porcine) injection 5,000 Units  5,000 Units Subcutaneous Q8H [place]   5,000 Units at 04/26/17 0541   . hydrALAZINE (APRESOLINE) tablet 100 mg  100 mg Oral BID   100 mg at 04/25/17 2205   . naloxone [facility]) injection 0.2 mg  0.2 mg Intravenous PRN       . ondansetron (ZOFRAN) injection 4 mg  4 mg Intravenous Q6H PRN       . pantoprazole (PROTONIX) EC tablet 40 mg  40 mg Oral QAM AC   40 mg at 04/26/17 0900   . polyethylene glycol (MIRALAX) packet 17 g  17 g Oral Daily   17 g at 04/26/17 1024   . senna-docusate (PERICOLACE) 8.6-50 MG per tablet 2 tablet  2 tablet Oral BID   2 tablet at 04/26/17 1023   . simvastatin (ZOCOR) tablet 20 mg  20 mg Oral QHS   20 mg at 04/25/17 2206   . sodium chloride 0.9 % bolus 100 mL  100 mL Intravenous Q1H PRN       . sodium chloride 0.9 % bolus 250 mL  250 mL Intravenous PRN       . traMADol (ULTRAM) tablet 50 mg  50 mg Oral Q6H PRN       .  vitamin D (cholecalciferol) tablet 2,000 Units  2 tablet Oral Daily   2,000 Units at 04/26/17 1021          Labs:reviewed     Results     Procedure Component Value Units Date/Time    Magnesium [[id number]] Collected:  04/26/17 0639     Specimen:  Blood Updated:  04/26/17 0737     Magnesium 2.5 mg/dL     Phosphorus [[id number]] Collected:  04/26/17 0639    Specimen:  Blood Updated:  04/26/17 0737     Phosphorus 4.2 mg/dL     Basic Metabolic Panel [[id number]]  (Abnormal) Collected:  04/26/17 0639    Specimen:  Blood Updated:  04/26/17 0737     Glucose 123 (H) mg/dL      BUN 58.8 (H) mg/dL      Creatinine 4.1 (H) mg/dL      Calcium 8.2 (L) mg/dL      Sodium 136 mEq/L      Potassium 5.5 (H) mEq/L      Chloride 109 mEq/L      CO2 22 mEq/L      Anion Gap 5.0    GFR [[id number]] Collected:  04/26/17 0639     Updated:  04/26/17 0737     EGFR 10.9    CBC with differential [[id number]]  (Abnormal) Collected:  04/26/17 0639    Specimen:  Blood from Blood Updated:  04/26/17 0712     WBC 8.78 x10 3/uL      Hgb 7.5 (L) g/dL      Hematocrit 23.6 (L) %      Platelets 250 x10 3/uL      RBC 3.35 (L) x10 6/uL      MCV 70.4 (L) fL      MCH 22.4 (L) pg      MCHC 31.8 (L) g/dL      RDW 16 (H) %      MPV 10.2 fL      Neutrophils 66.7 %      Lymphocytes Automated 24.1 %      Monocytes 5.4 %      Eosinophils Automated 3.0 %      Basophils Automated 0.6 %      Immature Granulocyte 0.2 %      Nucleated RBC 0.0 /100 WBC      Neutrophils Absolute 5.86 x10 3/uL      Abs Lymph Automated 2.12 x10 3/uL      Abs Mono Automated 0.47 x10 3/uL      Abs Eos Automated 0.26 x10 3/uL      Absolute Baso Automated 0.05 x10 3/uL      Absolute Immature Granulocyte 0.02 x10 3/uL      Absolute NRBC 0.00 x10 3/uL     Basic Metabolic Panel [[id number]]  (Abnormal) Collected:  04/25/17 1843    Specimen:  Blood Updated:  04/25/17 1908     Glucose 141 (H) mg/dL      BUN 62.2 (H) mg/dL      Creatinine 4.1 (H) mg/dL      Calcium 7.9 (L) mg/dL      Sodium 136 mEq/L      Potassium 5.1 mEq/L      Chloride 109 mEq/L      CO2 20 (L) mEq/L      Anion Gap 7.0    GFR [[id number]] Collected:  04/25/17 1843     Updated:  04/25/17 1908     EGFR 10.9    CBC without differential [[id number]]  (  Abnormal) Collected:   04/25/17 1843    Specimen:  Blood from Blood Updated:  04/25/17 1850     WBC 9.34 x10 3/uL      Hgb 7.3 (L) g/dL      Hematocrit 23.6 (L) %      Platelets 234 x10 3/uL      RBC 3.32 (L) x10 6/uL      MCV 71.1 (L) fL      MCH 22.0 (L) pg      MCHC 30.9 (L) g/dL      RDW 16 (H) %      MPV 9.2 (L) fL      Nucleated RBC 0.0 /100 WBC      Absolute NRBC 0.00 x10 3/uL           Rads:reviewed   Radiological Procedure reviewed.  Radiology Results (24 Hour)     Procedure Component Value Units Date/Time    XR Chest 2 Views [[id number]] Collected:  04/26/17 1101    Order Status:  Completed Updated:  04/26/17 1106    Narrative:       Indication: Cough.    Procedure: AP and lateral views of the chest.    Comparison: May 2018.    Findings: Sternal wires are present. Mild cardiomegaly. There is a right  internal jugular central line. Small left pleural effusion. Trace right  pleural effusion.      Impression:        Small effusions. No evidence of pneumonia.    Roetta Sessions, MD   04/26/2017 11:02 AM          Multi-Disciplinary Care Input:   Case management notes: No Patient Care Coordination Note on file.     PT/OT/ST notes:          Signed by: Dirk Dress  04/26/2017 1:52 PM         This note was generated by the Epic EMR system/ Dragon speech recognition and may contain inherent errors or omissions not intended by the user. Grammatical errors, random word insertions, deletions, pronoun errors and incomplete sentences are occasional consequences of this technology due to software limitations. Not all errors are caught or corrected. If there are questions or concerns about the content of this note or information contained within the body of this dictation they should be addressed directly with the author for clarification

## 2017-04-26 NOTE — Plan of Care (Signed)
Problem: Renal Instability  Goal: Fluid and electrolyte balance are achieved/maintained  Outcome: Progressing   04/26/17 1326   Goal/Interventions addressed this shift   Fluid and electrolyte balance are achieved/maintained  Monitor intake and output every shift;Monitor/assess lab values and report abnormal values;Provide adequate hydration;Assess for confusion/personality changes;Assess and reassess fluid and electrolyte status     Goal: Nutritional intake is adequate  Outcome: Progressing   04/26/17 1326   Goal/Interventions addressed this shift   Nutritional intake is adequate Assist patient with meals/food selection;Allow adequate time for meals;Include patient/patient care companion in decisions related to nutrition       Problem: Safety  Goal: Patient will be free from injury during hospitalization  Outcome: Progressing   04/26/17 1326   Goal/Interventions addressed this shift   Patient will be free from injury during hospitalization  Assess patient's risk for falls and implement fall prevention plan of care per policy;Provide and maintain safe environment;Use appropriate transfer methods;Ensure appropriate safety devices are available at the bedside;Include patient/ family/ care giver in decisions related to safety;Hourly rounding;Assess for patients risk for elopement and implement Elopement Risk Plan per policy   Rounding per policy.Offered assistance as needed.Bed in lowest position. Safe environment provided.Call bell within reach.   Goal: Patient will be free from infection during hospitalization  Outcome: Progressing   04/26/17 1326   Goal/Interventions addressed this shift   Free from Infection during hospitalization Assess and monitor for signs and symptoms of infection;Monitor lab/diagnostic results     Pt is afebrile.    Problem: Pain  Goal: Pain at adequate level as identified by patient  Outcome: Progressing   04/26/17 1326   Goal/Interventions addressed this shift   Pain at adequate level as  identified by patient Identify patient comfort function goal;Assess for risk of opioid induced respiratory depression, including snoring/sleep apnea. Alert healthcare team of risk factors identified.;Assess pain on admission, during daily assessment and/or before any "as needed" intervention(s);Include patient/patient care companion in decisions related to pain management as needed     Pt denies pain at this time.      Problem: Discharge Barriers  Goal: Patient will be discharged home or other facility with appropriate resources  Outcome: Progressing   04/26/17 1326   Goal/Interventions addressed this shift   Discharge to home or other facility with appropriate resources Provide appropriate patient education;Provide information on available health resources;Initiate discharge planning       Problem: Psychosocial and Spiritual Needs  Goal: Demonstrates ability to cope with hospitalization/illness  Outcome: Progressing   04/26/17 1326   Goal/Interventions addressed this shift   Demonstrates ability to cope with hospitalizations/illness Encourage verbalization of feelings/concerns/expectations;Provide quiet environment;Assist patient to identify own strengths and abilities;Encourage patient to set small goals for self     No needs observed or vocalized at this time.    Problem: Moderate/High Fall Risk Score >5  Goal: Patient will remain free of falls  Outcome: Progressing   04/26/17 0805   OTHER   Moderate Risk (6-13) MOD-Include family in multidisciplinary POC discussions;MOD-Place Fall Risk level on whiteboard in room     Understands fall risk assessment. Fall risk assessment on board. Patient has remained free of falls this shift.

## 2017-04-26 NOTE — UM Notes (Signed)
Pnt presented as direct admit to med surg unit as inpatient on 04/25/17    66 year old female with history of chronic kidney disease, referred as a direct admit to medical floor for hemodialysis after permanent catheter placement in right upper chest this morning in [place]. Most of the history is taken from patient's daughter, as pt speaks only [place] language.She complains of recently being very lethargic, with nausea, vomiting, headache, poor urine output.,generalized weakness She denies fever, chills, abdominal pain, diarrhea or constipation  After arrival. We have done complete blood count, BMP, noted to have anemia at hemoglobin of 7.3./Hematocrit 23.6, creatinine 4.1, BUN 62  Will admit for hemodialysis in a.m.    Past Medical History        Past Medical History:   Diagnosis Date   . Anemia 10/02/2016    H/O ANEMIA - REFLECTED ON CURRENT LABS.   [name] Cataracts, bilateral     H/O CATARACTS - SURGERY DONE - STILL HAS SOME ISSUES.   . Cerebrovascular accident     H/O STROKE - WEAKNESS ON LEFT SIDE. NO NEUROLOGIST.    [name] Constipation    . Coronary artery disease 06/2016    H/O QUADRUPLE BYPASS SURGERY - FOLLOWED BY [facility].   [name] History of MI (myocardial infarction)    . 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 Hypercholesteremia     CONTROLLED WITH MEDS.   [name] Hypertension     CONTROLLED WITH MEDS.   . Pre-diabetes    . Renal insufficiency     CHRONIC KIDNEY DISEASE - FOLLOWED BY DR. Kate Sable.        Past Surgical History         Past Surgical History:   Procedure Laterality Date   . CORONARY ARTERY BYPASS N/A 06/12/2016    Procedure: Coronary Artery Bypass x 4.;  Surgeon: Bjorn Loser, MD;  Location: [facility] OR;  Service: Cardiothoracic;  Laterality: N/A;  LIMA to LDA  Vein to PDA  Vein to OM  Vein to Diagonal   . EGD, COLONOSCOPY N/A 10/04/2016   . EGD, COLONOSCOPY N/A 10/04/2016    Procedure: EGD w/ bx, COLONOSCOPY w/ bx, polypectomy;  Surgeon: Norton Blizzard, MD;  Location: [facility] OR;  Service: Gastroenterology;  Laterality: N/A;   . ENDOSCOPIC,VEIN HARVEST N/A 06/12/2016    Procedure: Endoscopic, Left Leg Greater Saphenous Vein Harvest;  Surgeon: Bjorn Loser, MD;  Location: [facility] OR;  Service: Cardiothoracic;  Laterality: N/A;  Left Leg Incision (medially, groin to ankle) @ 0742  Vein out of leg @ 0826  Vein prep complete @ 0845  Total time = 63 minutes        VS: T 98.6, p 83, SATS 96%, resp 16, bp 192/87    Abnormal Labs: hgb 7.3, hct 23.6, rbc 3.32, glucose 141, bun 62.2, cr 4.1, co2 20, cal 7.9,     Patient transferred to med surg unit as inpatient on 04/25/17 for ESRD.    Assessment and Plan per MD:   Acute on chronic kidney disease--now end-stage renal disease. Patient has been recently lethargic, with generalized weakness, nausea, vomiting, oliguria with uremic symptoms. Nephrology plan for hemodialysis tomorrow, already from [facility] this morning and right-sided chest, check complete blood count, BMP in am    History of hypertension--blood pressure stable, continue aquatic, clonidine, Bumex, amlodipine, hydralazine   History of hyperlipidemia on Zocor.   Gastroesophageal reflux disease on Protonix.   anemia, most likely  anemia of chronic disease versus iron deficiency anemia --on ferrous sulfate. Recent iron profile showed iron 35, ferritin 139   History of constipation and senna, MiraLAX at home    Orders:  Current Inpatient Medications with Last Dose Taken            Current Facility-Administered Medications   Medication Dose Route Frequency Last Rate Last Dose   . acetaminophen (TYLENOL) tablet 650 mg  650 mg Oral Q4H PRN       . acetaminophen (TYLENOL) tablet 650 mg  650 mg Oral Q4H PRN       . amLODIPine (NORVASC) tablet 10 mg  10 mg Oral Daily   10 mg at 04/25/17 2205   . aspirin EC EC tablet 325 mg  325 mg Oral Daily       . bumetanide (BUMEX) tablet 0.5 mg  0.5 mg Oral Daily       . carvedilol (COREG) tablet 12.5 mg  12.5 mg Oral BID Meals    12.5 mg at 04/25/17 2100   . cloNIDine (CATAPRES) tablet 0.2 mg  0.2 mg Oral BID   0.2 mg at 04/25/17 2100   . ferrous sulfate EC tablet 324 mg  324 mg Oral Daily       . heparin (porcine) injection 5,000 Units  5,000 Units Subcutaneous Q8H [place]   5,000 Units at 04/26/17 0541   . hydrALAZINE (APRESOLINE) tablet 100 mg  100 mg Oral BID   100 mg at 04/25/17 2205   . naloxone [facility]) injection 0.2 mg  0.2 mg Intravenous PRN       . ondansetron (ZOFRAN) injection 4 mg  4 mg Intravenous Q6H PRN       . pantoprazole (PROTONIX) EC tablet 40 mg  40 mg Oral QAM AC       . polyethylene glycol (MIRALAX) packet 17 g  17 g Oral Daily   17 g at 04/25/17 2059   . senna-docusate (PERICOLACE) 8.6-50 MG per tablet 2 tablet  2 tablet Oral BID   2 tablet at 04/25/17 2059   . simvastatin (ZOCOR) tablet 20 mg  20 mg Oral QHS   20 mg at 04/25/17 2206   . traMADol (ULTRAM) tablet 50 mg  50 mg Oral Q6H PRN       . vitamin D (cholecalciferol) tablet 2,000 Units  2 tablet Oral Daily            Renal diet  I/O  O2 5 lpm or less  Tele  VS q 4 hrs    MED NOTES:  Assessment/Plan   End-stage renal disease with uremic symptoms : Hemodialysis to be initiated by nephrology today  Continue supportive and symptomatic treatment  Cough ongoing for many months.  Chest x-ray shows bilateral pleural effusion: Possible bronchitis  Starting azithromycin, Advair, and administer nebulizers as needed.  Start Mucinex  Hopefully hemodialysis will help with the pleural effusion  History of coronary artery disease and recent coronary artery bypass graft on June 12, 2016   Continue aspirin, beta blocker and statin  History of CVA in 2014 with mild left lower extremity weakness :  At baseline. Continue aspirin, statin  Diet-controlled diabetes mellitus : Hemoglobin A1c in June was 6.2  Start low-dose sliding scale and diabetic diet  Essential hypertension: Continue current medications including Norvasc, Bumex, Coreg, clonidine and hydralazine  Anemia of chronic  kidney disease : Continue ferrous sulfate, monitor hemoglobin  Dyslipidemia :Continue statin  Gastroesophageal reflux disease: Continue PPI  DVT Prohylaxis: Heparin     NEPHROLOGY NOTES:  Assessment:   Stage V CKD ---> ESRD   DM   Hypertension   Anemia   Dyslipidemia   S/P permcath 8/22   Bilateral pleural effusions   S/P CABG    Plan:   Initiate dialysis today   Next dialysis in am   ESA   Check Fe stores   OP dialysis placement

## 2017-04-26 NOTE — Progress Notes (Signed)
Assessment done, Pt denies pain or acute distress.POC and safety plan reviewed with pt and daughter, Pt resting in bed, call bell within reach. Will continue to monitor.

## 2017-04-26 NOTE — Progress Notes (Signed)
[facility].  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 albuterol-ipratropium  3 mL Nebulization Q6H SCH   . amLODIPine  10 mg Oral Daily   . aspirin EC  325 mg Oral Daily   . azithromycin  500 mg Oral Daily   . bumetanide  0.5 mg Oral Daily   . carvedilol  12.5 mg Oral BID Meals   . cloNIDine  0.2 mg Oral BID   . darbepoetin alfa  60 mcg Subcutaneous Weekly   . ferrous sulfate  324 mg Oral Daily   . fluticasone-salmeterol  1 puff Inhalation BID   . guaiFENesin  600 mg Oral Q12H [place]   . heparin (porcine)  5,000 Units Subcutaneous Q8H [place]   . hydrALAZINE  100 mg Oral BID   . lactobacillus/streptococcus  1 capsule Oral Daily   . pantoprazole  40 mg Oral QAM AC   . polyethylene glycol  17 g Oral Daily   . senna-docusate  2 tablet Oral BID   . simvastatin  20 mg Oral QHS   . vitamin D  2 tablet Oral Daily     Continuous Infusions:  PRN Meds:acetaminophen, acetaminophen, albumin human, Nursing communication: Adult Hypoglycemia Treatment Algorithm **AND** dextrose **AND** dextrose **AND** glucagon (rDNA), heparin (porcine), insulin lispro, insulin lispro, naloxone, ondansetron, sodium chloride, sodium chloride, traMADol    Objective:  Vital signs in last 24 hours:  Temp:  [97.9 F (36.6 C)-98.8 F (37.1 C)] 98.8 F (37.1 C)  Heart Rate:  [65-87] 65  Resp Rate:  [12-18] 18  BP: (112-192)/(66-87) 149/66    Intake/Output from yesterday (07:01 - 07:00):  08/22 0701 - 08/23 0700  In: 480 [P.O.:480]  Out: 750  [Urine:750]     Physical Exam:   Gen: WD WN NAD   CV: S1 S2 N RRR   Chest: CTAB   Ab: ND NT soft no HSM +BS   Ext: No C/E    Labs:    Recent Labs  Lab 04/26/17  0639 04/25/17  1843   Glucose 123* 141*   BUN 58.8* 62.2*   Creatinine 4.1* 4.1*   Calcium 8.2* 7.9*   Sodium 136 136   Potassium 5.5* 5.1   Chloride 109 109   CO2 22 20*   Phosphorus 4.2  --    Magnesium 2.5  --        Recent Labs  Lab 04/26/17  0639 04/25/17  1843   WBC 8.78 9.34   Hgb 7.5* 7.3*   Hematocrit 23.6* 23.6*   MCV 70.4* 71.1*   MCH 22.4* 22.0*   MCHC 31.8* 30.9*   RDW 16* 16*   MPV 10.2 9.2*   Platelets 250 234

## 2017-04-26 NOTE — Plan of Care (Signed)
Problem: Renal Instability  Goal: Fluid and electrolyte balance are achieved/maintained  Outcome: Progressing   04/26/17 0019   Goal/Interventions addressed this shift   Fluid and electrolyte balance are achieved/maintained  Monitor intake and output every shift;Monitor/assess lab values and report abnormal values;Assess for confusion/personality changes;Monitor daily weight;Provide adequate hydration;Assess and reassess fluid and electrolyte status;Observe for seizure activity and initiate seizure precautions if indicated;Observe for cardiac arrhythmias;Monitor for muscle weakness     Goal: Nutritional intake is adequate  Outcome: Progressing   04/26/17 0019   Goal/Interventions addressed this shift   Nutritional intake is adequate Monitor daily weights;Assist patient with meals/food selection;Allow adequate time for meals;Encourage/perform oral hygiene as appropriate;Include patient/patient care companion in decisions related to nutrition;Assess anorexia, appetite, and amount of meal/food tolerated       Problem: Safety  Goal: Patient will be free from injury during hospitalization  Outcome: Progressing   04/26/17 0019   Goal/Interventions addressed this shift   Patient will be free from injury during hospitalization  Assess patient's risk for falls and implement fall prevention plan of care per policy;Provide and maintain safe environment;Use appropriate transfer methods;Ensure appropriate safety devices are available at the bedside;Include patient/ family/ care giver in decisions related to safety;Hourly rounding;Assess for patients risk for elopement and implement Elopement Risk Plan per policy     Goal: Patient will be free from infection during hospitalization  Outcome: Progressing   04/26/17 0019   Goal/Interventions addressed this shift   Free from Infection during hospitalization Assess and monitor for signs and symptoms of infection;Monitor lab/diagnostic results       Problem: Psychosocial and Spiritual  Needs  Goal: Demonstrates ability to cope with hospitalization/illness  Outcome: Progressing   04/26/17 0019   Goal/Interventions addressed this shift   Demonstrates ability to cope with hospitalizations/illness Encourage verbalization of feelings/concerns/expectations;Provide quiet environment;Encourage patient to set small goals for self;Assist patient to identify own strengths and abilities;Encourage participation in diversional activity;Reinforce positive adaptation of new coping behaviors;Include patient/ patient care companion in decisions

## 2017-04-26 NOTE — Plan of Care (Signed)
Problem: Safety  Goal: Patient will be free from injury during hospitalization  Outcome: Progressing   04/26/17 2347   Goal/Interventions addressed this shift   Patient will be free from injury during hospitalization  Assess patient's risk for falls and implement fall prevention plan of care per policy;Provide and maintain safe environment;Use appropriate transfer methods;Ensure appropriate safety devices are available at the bedside;Include patient/ family/ care giver in decisions related to safety;Hourly rounding;Assess for patients risk for elopement and implement [place] per policy;Provide alternative method of communication if needed (communication boards, writing)       Problem: Pain  Goal: Pain at adequate level as identified by patient  Outcome: Progressing   04/26/17 2347   Goal/Interventions addressed this shift   Pain at adequate level as identified by patient Identify patient comfort function goal;Assess for risk of opioid induced respiratory depression, including snoring/sleep apnea. Alert healthcare team of risk factors identified.;Assess pain on admission, during daily assessment and/or before any "as needed" intervention(s);Reassess pain within 30-60 minutes of any procedure/intervention, per Pain Assessment, Intervention, Reassessment (AIR) Cycle;Evaluate if patient comfort function goal is met;Evaluate patient's satisfaction with pain management progress;Offer non-pharmacological pain management interventions;Include patient/patient care companion in decisions related to pain management as needed       Problem: Psychosocial and Spiritual Needs  Goal: Demonstrates ability to cope with hospitalization/illness  Outcome: Progressing   04/26/17 2347   Goal/Interventions addressed this shift   Demonstrates ability to cope with hospitalizations/illness Encourage verbalization of feelings/concerns/expectations;Provide quiet environment;Encourage patient to set small goals for self;Assist  patient to identify own strengths and abilities;Encourage participation in diversional activity;Include patient/ patient care companion in decisions

## 2017-04-27 LAB — BASIC METABOLIC PANEL
Anion Gap: 6 (ref 5.0–15.0)
BUN: 35.5 mg/dL — ABNORMAL HIGH (ref 7.0–19.0)
CO2: 25 mEq/L (ref 22–29)
Calcium: 8.1 mg/dL — ABNORMAL LOW (ref 8.5–10.5)
Chloride: 104 mEq/L (ref 100–111)
Creatinine: 3.2 mg/dL — ABNORMAL HIGH (ref 0.6–1.0)
Glucose: 116 mg/dL — ABNORMAL HIGH (ref 70–100)
Potassium: 5.2 mEq/L — ABNORMAL HIGH (ref 3.5–5.1)
Sodium: 135 mEq/L — ABNORMAL LOW (ref 136–145)

## 2017-04-27 LAB — CBC
Absolute NRBC: 0 10*3/uL
Hematocrit: 23.9 % — ABNORMAL LOW (ref 37.0–47.0)
Hgb: 7.5 g/dL — ABNORMAL LOW (ref 12.0–16.0)
MCH: 21.9 pg — ABNORMAL LOW (ref 28.0–32.0)
MCHC: 31.4 g/dL — ABNORMAL LOW (ref 32.0–36.0)
MCV: 69.9 fL — ABNORMAL LOW (ref 80.0–100.0)
MPV: 10.5 fL (ref 9.4–12.3)
Nucleated RBC: 0 /100 WBC (ref 0.0–1.0)
Platelets: 230 10*3/uL (ref 140–400)
RBC: 3.42 10*6/uL — ABNORMAL LOW (ref 4.20–5.40)
RDW: 16 % — ABNORMAL HIGH (ref 12–15)
WBC: 6.04 10*3/uL (ref 3.50–10.80)

## 2017-04-27 LAB — GLUCOSE WHOLE BLOOD - POCT
Whole Blood Glucose POCT: 118 mg/dL — ABNORMAL HIGH (ref 70–100)
Whole Blood Glucose POCT: 116 mg/dL — ABNORMAL HIGH (ref 70–100)
Whole Blood Glucose POCT: 186 mg/dL — ABNORMAL HIGH (ref 70–100)
Whole Blood Glucose POCT: 132 mg/dL — ABNORMAL HIGH (ref 70–100)

## 2017-04-27 LAB — GFR: EGFR: 14.6

## 2017-04-27 MED ORDER — HEPARIN SODIUM (PORCINE) 1000 UNIT/ML IJ SOLN
1000.0000 [IU] | INTRAMUSCULAR | Status: DC | PRN
Start: 2017-04-27 — End: 2017-04-27
  Filled 2017-04-27: qty 1

## 2017-04-27 MED ORDER — SODIUM CHLORIDE 0.9 % IV BOLUS
100.0000 mL | INTRAVENOUS | Status: DC | PRN
Start: 2017-04-27 — End: 2017-04-27

## 2017-04-27 MED ORDER — SODIUM CHLORIDE 0.9 % IV MBP
1.0000 g | INTRAVENOUS | Status: DC
Start: 2017-04-27 — End: 2017-05-01
  Administered 2017-04-27 – 2017-05-01 (×5): 1 g via INTRAVENOUS
  Filled 2017-04-27 (×5): qty 1000

## 2017-04-27 MED ORDER — ALBUMIN HUMAN 25 % IV SOLN
100.0000 mL | INTRAVENOUS | Status: DC | PRN
Start: 2017-04-27 — End: 2017-04-27
  Filled 2017-04-27: qty 100

## 2017-04-27 MED ORDER — CLONIDINE HCL 0.1 MG PO TABS
0.1000 mg | ORAL_TABLET | Freq: Two times a day (BID) | ORAL | Status: AC
Start: 2017-04-27 — End: 2017-04-29
  Administered 2017-04-27 – 2017-04-29 (×4): 0.1 mg via ORAL
  Filled 2017-04-27 (×4): qty 1

## 2017-04-27 MED ORDER — SODIUM CHLORIDE 0.9 % IV BOLUS
250.0000 mL | INTRAVENOUS | Status: DC | PRN
Start: 2017-04-27 — End: 2017-04-27

## 2017-04-27 NOTE — Progress Notes (Signed)
HD CARE COORDINATOR NOTE -  Attempt made to reach pt's daughter via bedside phone [phone], line rang continuously with no answer. Text message sent to daughters cell phone asking for a call back.     Freida Busman, HD Care Coordinator   [phone]

## 2017-04-27 NOTE — Progress Notes (Signed)
HD CARE COORDINATOR NOTE - Probation officer spoke with Patrici Ranks at [facility] ([facility]), clinic is unable to offer a confirmed chair time for pt until Monday -  Clinic is close to capacity, will have to do some juggling and clinic administrator is not in today.  Writer rcvd a return call from pt's daughter and updated her on placement delay.  She is OK looking a different location - [facility].  Writer has requested [facility] admissions to [facility] to [facility]. Pt not likely to be placed before Monday.     Freida Busman, HD Care Coordinator  [phone]

## 2017-04-27 NOTE — Progress Notes (Signed)
[facility] Hospitalist Daily Progress Note        Date Time: 04/27/2017  12:40 PM  Patient Name:Jacqueline Weber  ERD:[id number]  PCP: John Giovanni, MD  Attending Physician:Larue Drawdy Sherre Poot M.D.      Chief Complaint:   Kidney failure    Subjective:     Torry Adamczak continues to cough , but reports slight improvement.  Then yesterday.  Denies any chest pain or shortness of breath.  Denies any nuchal  Denies any abdominal pain - No Nausea, vomiting or diarrhea  No new weakness tingling or numbness.  Assessment/Plan   End-stage renal disease with uremic symptoms : Patient has a right permacath in place  Hemodialysis to be initiated by nephrology yesterday  Repeat dialysis today  Case management working arrangement for outpatient hemodialysis  Acute bronchitis: Slowly improving.  Continue nebulizers and Advair.  Continue Mucinex.  Continue azithromycin and Rocephin History of coronary artery disease and recent coronary artery bypass graft on June 12, 2016   Continue aspirin, beta blocker and statin  History of CVA in 2014 with mild left lower extremity weakness :  At baseline. Continue aspirin, statin  Diet-controlled diabetes mellitus : Hemoglobin A1c in June was 6.2  Start low-dose sliding scale and diabetic diet  Essential hypertension: Continue current medications including Norvasc, Bumex, Coreg, clonidine and hydralazine  Anemia of chronic kidney disease : Continue ferrous sulfate, monitor hemoglobin  Dyslipidemia :Continue statin  Gastroesophageal reflux disease: Continue PPI      DVT Prohylaxis: Heparin   Code Status: Full Code   Prognosis: Guarded  Type of Admission:Inpatient  Central Line/Foley Catheter/PICC line status: none  Estimated Length of Stay (including stay in the ER receiving treatment): > 2 midnights  Medical Necessity for stay: End-stage renal disease  Expected Date of Discharge:  2- 3 days   Allergies:     Allergies   Allergen Reactions   .  Mosquito (Culex Pipiens) Allergy Skin Test Shortness Of Breath     And  Trouble  breathing   . Lisinopril    . Shellfish-Derived Products Hives     New  allergy  New  allergy   . Penicillins Rash     Swelling,  numbness       Physical Exam:   Vitals reviewed:   height is 1.524 m (5') and weight is 59.4 kg (131 lb). Her temperature is 98.5 F (36.9 C). Her blood pressure is 165/70 and her pulse is 73. Her respiration is 16 and oxygen saturation is 97%.   Body mass index is 25.58 kg/m.  Vitals:    04/27/17 1145 04/27/17 1200 04/27/17 1215 04/27/17 1230   BP: 172/75 180/79 164/71 165/70   Pulse: 73 75 74 73   Resp:       Temp:       TempSrc:       SpO2:       Weight:       Height:         Intake and Output Summary (Last 24 hours) at Date Time    Intake/Output Summary (Last 24 hours) at 04/27/17 1240  Last data filed at 04/27/17 1000   Gross per 24 hour   Intake             1380 ml   Output             2300 ml   Net             -920 ml  Exam  Awake Alert, Oriented *3, No new F.N deficits, Normal affect  NC.AT,PERRAL  Supple Neck,No JVD, No cervical lymphadenopathy appriciated.   Symmetrical Chest wall movement, Good air movement bilaterally, bibasilar crackles and mild expiratory rhonchi bilaterally  RRR,No Gallops,Rubs or new Murmurs, No Parasternal Heave  +ve B.Sounds, Abd Soft, Non tender, No organomegaly appriciated, No rebound -guarding or rigidity.  Trace bilateral lower extremity edema, No new Rash or bruise    Consult Input/Plan     Nephrology    Medications:     Current Facility-Administered Medications   Medication Dose Route Frequency Last Rate Last Dose   . acetaminophen (TYLENOL) tablet 650 mg  650 mg Oral Q4H PRN       . acetaminophen (TYLENOL) tablet 650 mg  650 mg Oral Q4H PRN       . albumin human 25 % bag 100 mL  100 mL Intravenous PRN       . albuterol-ipratropium (DUO-NEB) 2.5-0.5(3) mg/3 mL nebulizer 3 mL  3 mL Nebulization Q6H [place]   3 mL at 04/27/17 0754   . amLODIPine (NORVASC) tablet 10 mg   10 mg Oral Daily   10 mg at 04/25/17 2205   . aspirin EC EC tablet 325 mg  325 mg Oral Daily   325 mg at 04/26/17 1022   . azithromycin (ZITHROMAX) tablet 500 mg  500 mg Oral Daily   500 mg at 04/26/17 1422   . bumetanide (BUMEX) tablet 0.5 mg  0.5 mg Oral Daily   0.5 mg at 04/26/17 1021   . carvedilol (COREG) tablet 12.5 mg  12.5 mg Oral BID Meals   12.5 mg at 04/27/17 1001   . cefTRIAXone (ROCEPHIN) 1 g in sodium chloride 0.9 % 100 mL IVPB mini-bag plus  1 g Intravenous Q24H SCH       . cloNIDine (CATAPRES) tablet 0.2 mg  0.2 mg Oral BID   0.2 mg at 04/26/17 1805   . darbepoetin alfa (ARANESP) injection 60 mcg  60 mcg Subcutaneous Weekly   60 mcg at 04/27/17 0926   . dextrose (GLUCOSE) 40 % oral gel 15 g of glucose  15 g of glucose Oral PRN        And   . dextrose 50 % bolus 12.5 g  12.5 g Intravenous PRN        And   . glucagon (rDNA) (GLUCAGEN) injection 1 mg  1 mg Intramuscular PRN       . ferrous sulfate EC tablet 324 mg  324 mg Oral Daily   324 mg at 04/26/17 1021   . fluticasone-salmeterol (ADVAIR DISKUS) 100-50 MCG/DOSE 1 puff  1 puff Inhalation BID   1 puff at 04/27/17 0755   . guaiFENesin (MUCINEX) 12 hr tablet 600 mg  600 mg Oral Q12H [place]   600 mg at 04/26/17 2150   . heparin (porcine) injection 1,000 Units  1,000 Units Intracatheter PRN       . heparin (porcine) injection 5,000 Units  5,000 Units Subcutaneous Q8H [place]   5,000 Units at 04/27/17 0548   . hydrALAZINE (APRESOLINE) tablet 100 mg  100 mg Oral BID   100 mg at 04/27/17 1003   . insulin lispro (HumaLOG) injection 1-3 Units  1-3 Units Subcutaneous QHS PRN   1 Units at 04/26/17 2150   . insulin lispro (HumaLOG) injection 1-5 Units  1-5 Units Subcutaneous TID AC PRN       . lactobacillus/streptococcus (RISAQUAD) capsule 1 capsule  1 capsule  Oral Daily   1 capsule at 04/26/17 1422   . naloxone [facility]) injection 0.2 mg  0.2 mg Intravenous PRN       . ondansetron (ZOFRAN) injection 4 mg  4 mg Intravenous Q6H PRN       . pantoprazole (PROTONIX) EC  tablet 40 mg  40 mg Oral QAM AC   40 mg at 04/27/17 0809   . polyethylene glycol (MIRALAX) packet 17 g  17 g Oral Daily   17 g at 04/26/17 1024   . senna-docusate (PERICOLACE) 8.6-50 MG per tablet 2 tablet  2 tablet Oral BID   2 tablet at 04/26/17 1805   . simvastatin (ZOCOR) tablet 20 mg  20 mg Oral QHS   20 mg at 04/26/17 2150   . sodium chloride 0.9 % bolus 100 mL  100 mL Intravenous Q1H PRN       . sodium chloride 0.9 % bolus 250 mL  250 mL Intravenous PRN       . traMADol (ULTRAM) tablet 50 mg  50 mg Oral Q6H PRN   50 mg at 04/26/17 2155   . vitamin D (cholecalciferol) tablet 2,000 Units  2 tablet Oral Daily   2,000 Units at 04/26/17 1021          Labs:reviewed     Results     Procedure Component Value Units Date/Time    Glucose Whole Blood - POCT [[id number]]  (Abnormal) Collected:  04/27/17 0739     Updated:  04/27/17 0753     POCT - Glucose Whole blood 118 (H) mg/dL     Basic Metabolic Panel [[id number]]  (Abnormal) Collected:  04/27/17 0620    Specimen:  Blood Updated:  04/27/17 0731     Glucose 116 (H) mg/dL      BUN 35.5 (H) mg/dL      Creatinine 3.2 (H) mg/dL      Calcium 8.1 (L) mg/dL      Sodium 135 (L) mEq/L      Potassium 5.2 (H) mEq/L      Chloride 104 mEq/L      CO2 25 mEq/L      Anion Gap 6.0    GFR [[id number]] Collected:  04/27/17 0620     Updated:  04/27/17 0731     EGFR 14.6    CBC without differential [[id number]]  (Abnormal) Collected:  04/27/17 0620    Specimen:  Blood from Blood Updated:  04/27/17 0719     WBC 6.04 x10 3/uL      Hgb 7.5 (L) g/dL      Hematocrit 23.9 (L) %      Platelets 230 x10 3/uL      RBC 3.42 (L) x10 6/uL      MCV 69.9 (L) fL      MCH 21.9 (L) pg      MCHC 31.4 (L) g/dL      RDW 16 (H) %      MPV 10.5 fL      Nucleated RBC 0.0 /100 WBC      Absolute NRBC 0.00 x10 3/uL     Glucose Whole Blood - POCT [[id number]]  (Abnormal) Collected:  04/26/17 2130     Updated:  04/26/17 2131     POCT - Glucose Whole blood 189 (H) mg/dL     Hepatitis B core antibody, total [[id number]]  Collected:  04/26/17 0639     Updated:  04/26/17 1929     Hepatitis B Core Total AB Nonreactive  Hepatitis B (HBV) Surface Antigen [[id number]] Collected:  04/26/17 0639    Specimen:  Blood Updated:  04/26/17 1927     Hepatitis B Surface AG Non-Reactive    Hepatitis C (HCV) antibody, Total [[id number]] Collected:  04/26/17 0639    Specimen:  Blood Updated:  04/26/17 1927     Hepatitis C, AB Non-Reactive    Hepatitis B (HBV) Surface Antibody [place] [[id number]] Collected:  04/26/17 0639     Updated:  04/26/17 1927     HEPATITIS B SURFACE ANTIBODY 23.36    Hemolysis index [[id number]] Collected:  04/26/17 0639     Updated:  04/26/17 1857     Hemolysis Index 2    Glucose Whole Blood - POCT [[id number]]  (Abnormal) Collected:  04/26/17 1801     Updated:  04/26/17 1848     POCT - Glucose Whole blood 169 (H) mg/dL           Rads:reviewed   Radiological Procedure reviewed.  Radiology Results (24 Hour)     ** No results found for the last 24 hours. **          Multi-Disciplinary Care Input:   Case management notes: No Patient Care Coordination Note on file.     PT/OT/ST notes:          Signed by: Dirk Dress  04/27/2017 12:40 PM         This note was generated by the Epic EMR system/ Dragon speech recognition and may contain inherent errors or omissions not intended by the user. Grammatical errors, random word insertions, deletions, pronoun errors and incomplete sentences are occasional consequences of this technology due to software limitations. Not all errors are caught or corrected. If there are questions or concerns about the content of this note or information contained within the body of this dictation they should be addressed directly with the author for clarification

## 2017-04-27 NOTE — Progress Notes (Signed)
Pt's BP elevated 191/76. Dialyis nurse notified. MD aware. Pt transported off unit for dialysis.

## 2017-04-27 NOTE — Procedures (Signed)
2ND HD treatment , 3  Hours  . 2.0 K (  per KPP, S-K 5.2 ) , 2.5 CA bath, 35 Bicarb.F160 dialyzer ,No Heparin.300 QB, 600 QD.Hypertensive , received Coreg and hydrALAZINE on HD.   Patient Vitals for the past 4 hrs:   BP Temp Pulse Resp   04/27/17 1305 182/71 98.9 F (37.2 C) 76 18   04/27/17 1300 170/79 - 76 -   04/27/17 1245 167/71 - 72 -   04/27/17 1230 165/70 - 73 -   04/27/17 1215 164/71 - 74 -   04/27/17 1200 180/79 - 75 -   04/27/17 1145 172/75 - 73 -   04/27/17 1130 145/73 - 69 -   04/27/17 1115 153/70 - 70 -   04/27/17 1100 146/67 - 68 -   04/27/17 1045 154/68 - 68 -   04/27/17 1030 150/72 - 68 -   04/27/17 1015 144/68 - 67 -   04/27/17 1001 200/86 - 66 -   04/27/17 0955 192/78 - 65 -   04/27/17 0945 170/77 98.5 F (36.9 C) 69 16

## 2017-04-27 NOTE — Plan of Care (Signed)
Problem: Patient Receiving Advanced Renal Therapies  Goal: Therapy access site remains intact  Outcome: Progressing   04/27/17 0847   Goal/Interventions addressed this shift   Therapy access site remains intact Assess therapy access site       Problem: Pain  Goal: Pain at adequate level as identified by patient  Outcome: Progressing   04/27/17 0847   Goal/Interventions addressed this shift   Pain at adequate level as identified by patient Identify patient comfort function goal;Assess pain on admission, during daily assessment and/or before any "as needed" intervention(s);Reassess pain within 30-60 minutes of any procedure/intervention, per Pain Assessment, Intervention, Reassessment (AIR) Cycle;Evaluate if patient comfort function goal is met;Evaluate patient's satisfaction with pain management progress;Offer non-pharmacological pain management interventions       Problem: Moderate/High Fall Risk Score >5  Goal: Patient will remain free of falls  Outcome: Progressing   04/27/17 0800   OTHER   Moderate Risk (6-13) MOD-Remain with patient during toileting;MOD-Place Fall Risk level on whiteboard in room

## 2017-04-27 NOTE — Progress Notes (Addendum)
HD CARE COORDINATOR NOTE - attempt made to reach pt's daughter by phone at [phone], call dropped to vm, left message.     Addendum: Referral sent to [facility], will need EKG     Freida Busman, HD Care Coordinator  [phone]

## 2017-04-27 NOTE — Progress Notes (Signed)
04/27/17 1148   Case Management Quick Doc   CM Comments 8/24 RNCM: Admitted with ESRD. Direct admit from physician office. HD today. Perm cath placed yesterday in R chest. New set up at [facility]. Referral sent yesterday to [facility]. Hep B labs obtained. EKG to be done today. Pt to return home with daughter. Daughter is main caregiver. CM following.    Physical Discharge Disposition Home, No Needs  (NEW HD SET UP WITH [facility] ST[facility]LING> LAUREL< [facility]ING CHAIR)   Discharge Disposition   Mode of Transportation Car   Pharell Rolfson Allison-[place], Rn, BSN, [employer]  Case Manager x [phone]

## 2017-04-27 NOTE — Progress Notes (Signed)
[facility].  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, HD 8/23, 8/24, next on Monday   DM   Hypertension   Anemia, on aranesp, not iron deficient   Dyslipidemia   S/P permcath 8/22   Bilateral pleural effusions   S/P CABG   Acute bronchitis    Plan:   Next dialysis Monday, labs are [facility] to wait   OP dialysis placement, I called [facility], they have no MWF chair, they have a TTS chair at  3PM, maybe, staffing is to be arranged        Jodene Nam, MD  Office [phone]  Spectra Link - n/a    ++++++++++++++++++++++++++++++++++++++++++++++++++++++++++++++  Subjective:   complaints of cough    Medications:  Scheduled Meds:  Current Facility-Administered Medications   Medication Dose Route Frequency   . albuterol-ipratropium  3 mL Nebulization Q6H SCH   . amLODIPine  10 mg Oral Daily   . aspirin EC  325 mg Oral Daily   . azithromycin  500 mg Oral Daily   . bumetanide  0.5 mg Oral Daily   . carvedilol  12.5 mg Oral BID Meals   . cefTRIAXone  1 g Intravenous Q24H SCH   . cloNIDine  0.2 mg Oral BID   . darbepoetin alfa  60 mcg Subcutaneous Weekly   . ferrous sulfate  324 mg Oral Daily   . fluticasone-salmeterol  1 puff Inhalation BID   . guaiFENesin  600 mg Oral Q12H [place]   . heparin (porcine)  5,000 Units Subcutaneous Q8H [place]   . hydrALAZINE  100 mg Oral BID   . lactobacillus/streptococcus  1 capsule Oral Daily   . pantoprazole  40 mg Oral QAM AC   . polyethylene glycol  17 g Oral Daily   . senna-docusate  2 tablet Oral BID   . simvastatin  20 mg Oral QHS   . vitamin D  2 tablet Oral Daily     Continuous Infusions:  PRN Meds:acetaminophen, acetaminophen, albumin human, Nursing communication: Adult Hypoglycemia Treatment Algorithm **AND** dextrose **AND** dextrose **AND** glucagon (rDNA), heparin (porcine), insulin lispro, insulin lispro, naloxone, ondansetron, sodium chloride, sodium chloride, traMADol    Objective:  Vital signs in last 24 hours:  Temp:   [97.7 F (36.5 C)-99.1 F (37.3 C)] 99.1 F (37.3 C)  Heart Rate:  [65-82] 76  Resp Rate:  [12-18] 14  BP: (127-200)/(65-86) 197/75  Intake/Output last 24 hours:    Intake/Output Summary (Last 24 hours) at 04/27/17 1423  Last data filed at 04/27/17 1305   Gross per 24 hour   Intake             1260 ml   Output             3250 ml   Net            -1990 ml     Intake/Output this shift:  I/O this shift:  In: 0   Out: 1400 [Urine:300; Other:1100]    Physical Exam:   Gen: alert, well developed, well nourished, No distress   Cardiac:  S1 S2,  no rub, no murmer   Chest: coarse rhonchi, no crackles, no wheezes   Abdomen: non distended, soft, non tender, no organomegaly   Ext: No edema              Vascular access, permcath.       Labs:    Recent Labs  Lab 04/27/17  0620 04/26/17  0639 04/25/17  1843   Glucose 116* 123* 141*   BUN 35.5* 58.8* 62.2*   Creatinine 3.2* 4.1* 4.1*   Calcium 8.1* 8.2* 7.9*   Sodium 135* 136 136   Potassium 5.2* 5.5* 5.1   Chloride 104 109 109   CO2 25 22 20*   Phosphorus  --  4.2  --    Magnesium  --  2.5  --        Recent Labs  Lab 04/27/17  0620 04/26/17  0639 04/25/17  1843   WBC 6.04 8.78 9.34   Hgb 7.5* 7.5* 7.3*   Hematocrit 23.9* 23.6* 23.6*   MCV 69.9* 70.4* 71.1*   MCH 21.9* 22.4* 22.0*   MCHC 31.4* 31.8* 30.9*   RDW 16* 16* 16*   MPV 10.5 10.2 9.2*   Platelets 230 250 234

## 2017-04-27 NOTE — Progress Notes (Signed)
8/24 RNCM: HD CHAIR HAS NOT BEEN CONFIRMED FOR PT. [facility] IS FULL AND IS NOT ABLE TO ACCEPT PT. [facility] IS TRYING TO FIT PT IN SCHEDULE. CM WILL NOT KNOW ANYTHING UNTIL Monday. DO NOT DISCHARGE PT OVER THE WEEKEND. PT HAS TO HAVE A CHAIR TIME AND FACILITY TO GO TO.

## 2017-04-27 NOTE — Plan of Care (Signed)
Problem: Safety  Goal: Patient will be free from injury during hospitalization  Outcome: Progressing   04/27/17 2149   Goal/Interventions addressed this shift   Patient will be free from injury during hospitalization  Assess patient's risk for falls and implement fall prevention plan of care per policy;Ensure appropriate safety devices are available at the bedside;Use appropriate transfer methods;Provide and maintain safe environment;Include patient/ family/ care giver in decisions related to safety;Hourly rounding;Assess for patients risk for elopement and implement [place] per policy;Provide alternative method of communication if needed (communication boards, writing)       Problem: Pain  Goal: Pain at adequate level as identified by patient  Outcome: Progressing   04/27/17 2149   Goal/Interventions addressed this shift   Pain at adequate level as identified by patient Identify patient comfort function goal;Assess for risk of opioid induced respiratory depression, including snoring/sleep apnea. Alert healthcare team of risk factors identified.;Assess pain on admission, during daily assessment and/or before any "as needed" intervention(s);Reassess pain within 30-60 minutes of any procedure/intervention, per Pain Assessment, Intervention, Reassessment (AIR) Cycle;Evaluate if patient comfort function goal is met;Evaluate patient's satisfaction with pain management progress;Offer non-pharmacological pain management interventions;Include patient/patient care companion in decisions related to pain management as needed       Problem: Psychosocial and Spiritual Needs  Goal: Demonstrates ability to cope with hospitalization/illness  Outcome: Progressing   04/27/17 2149   Goal/Interventions addressed this shift   Demonstrates ability to cope with hospitalizations/illness Provide quiet environment;Encourage verbalization of feelings/concerns/expectations;Encourage participation in diversional activity;Assist  patient to identify own strengths and abilities;Encourage patient to set small goals for self;Include patient/ patient care companion in decisions

## 2017-04-28 ENCOUNTER — Inpatient Hospital Stay: Payer: Medicare Other

## 2017-04-28 DIAGNOSIS — D509 Iron deficiency anemia, unspecified: Secondary | ICD-10-CM

## 2017-04-28 DIAGNOSIS — D631 Anemia in chronic kidney disease: Secondary | ICD-10-CM | POA: Diagnosis present

## 2017-04-28 HISTORY — DX: Iron deficiency anemia, unspecified: D50.9

## 2017-04-28 LAB — ECG 12-LEAD
Atrial Rate: 76 {beats}/min
Atrial Rate: 63 {beats}/min
P Axis: 34 degrees
P Axis: 11 degrees
P-R Interval: 156 ms
P-R Interval: 160 ms
Q-T Interval: 396 ms
Q-T Interval: 428 ms
QRS Duration: 70 ms
QRS Duration: 80 ms
QTC Calculation (Bezet): 445 ms
QTC Calculation (Bezet): 437 ms
R Axis: 41 degrees
R Axis: 11 degrees
T Axis: 108 degrees
T Axis: 51 degrees
Ventricular Rate: 76 {beats}/min
Ventricular Rate: 63 {beats}/min

## 2017-04-28 LAB — GLUCOSE WHOLE BLOOD - POCT
Whole Blood Glucose POCT: 133 mg/dL — ABNORMAL HIGH (ref 70–100)
Whole Blood Glucose POCT: 132 mg/dL — ABNORMAL HIGH (ref 70–100)
Whole Blood Glucose POCT: 195 mg/dL — ABNORMAL HIGH (ref 70–100)
Whole Blood Glucose POCT: 219 mg/dL — ABNORMAL HIGH (ref 70–100)

## 2017-04-28 LAB — POTASSIUM: Potassium: 4.3 mEq/L (ref 3.5–5.1)

## 2017-04-28 LAB — PHOSPHORUS: Phosphorus: 3.7 mg/dL (ref 2.3–4.7)

## 2017-04-28 MED ORDER — ACETYLCYSTEINE 20 % IN SOLN
1.5000 mL | Freq: Four times a day (QID) | RESPIRATORY_TRACT | Status: DC
Start: 2017-04-28 — End: 2017-04-28
  Filled 2017-04-28 (×7): qty 4

## 2017-04-28 MED ORDER — LOSARTAN POTASSIUM 25 MG PO TABS
50.0000 mg | ORAL_TABLET | Freq: Every day | ORAL | Status: DC
Start: 2017-04-29 — End: 2017-05-01
  Administered 2017-04-29 – 2017-05-01 (×3): 50 mg via ORAL
  Filled 2017-04-28 (×3): qty 2

## 2017-04-28 MED ORDER — METHYLPREDNISOLONE SODIUM SUCC 40 MG IJ SOLR
20.0000 mg | Freq: Three times a day (TID) | INTRAMUSCULAR | Status: DC
Start: 2017-04-28 — End: 2017-05-01
  Administered 2017-04-28 – 2017-05-01 (×8): 20 mg via INTRAVENOUS
  Filled 2017-04-28 (×8): qty 1

## 2017-04-28 MED ORDER — IRON SUCROSE 20 MG/ML IV SOLN
200.0000 mg | Freq: Once | INTRAVENOUS | Status: AC
Start: 2017-04-28 — End: 2017-04-28
  Administered 2017-04-28: 13:00:00 200 mg via INTRAVENOUS
  Filled 2017-04-28: qty 10

## 2017-04-28 MED ORDER — NEPHRO-VITE 0.8 MG PO TABS
1.0000 | ORAL_TABLET | Freq: Every day | ORAL | Status: DC
Start: 2017-04-29 — End: 2017-05-01
  Administered 2017-04-29 – 2017-05-01 (×3): 1 via ORAL
  Filled 2017-04-28 (×4): qty 1

## 2017-04-28 MED ORDER — ACETYLCYSTEINE 20 % IN SOLN
1.5000 mL | Freq: Four times a day (QID) | RESPIRATORY_TRACT | Status: AC
Start: 2017-04-28 — End: 2017-04-30
  Administered 2017-04-28 – 2017-04-30 (×8): 1.5 mL via RESPIRATORY_TRACT
  Filled 2017-04-28 (×8): qty 4

## 2017-04-28 MED ORDER — LOSARTAN POTASSIUM 25 MG PO TABS
25.0000 mg | ORAL_TABLET | Freq: Every day | ORAL | Status: DC
Start: 2017-04-28 — End: 2017-04-28
  Administered 2017-04-28: 13:00:00 25 mg via ORAL
  Filled 2017-04-28: qty 1

## 2017-04-28 NOTE — Consults (Signed)
Pulmonary Consult  Note    Date Time: 04/28/17 1:23 PM  Patient Name: Jacqueline Weber  Attending Physician: Dirk Dress, MD      Subjective:   Patient Seen and Examined. The notes, labs and  X-rays  were reviewed.     66 years old pt non smoker admitted with URI cough / congested +chest SOB   acute on CKD . Pt has been started on IV abx and neb however continues to have severe persistent cough .  According to pt and her sister cough and URI symptoms started about 6 months ago and have been constant . Cough is day and night / yellow moderate sputum /. No hemoptysis / fevers chills . No exposures no new changes .     Assessment:     ESRD / ARF  Chronic / persistent cough x 5-6 month  Acute bronchitis   R/O Bronchiectasis /? Chronic Lt base infiltrate   HTN  CAD   H/o CVA   Anemia   GERD         Plan:n:       Check :  HRCT chest without contrast  Start :  Mucomyst  Flutter valve   Flonase    Continue Duoneb  IV Solumedrol  Advair disc   Protonix     IV Rocephin + Zithromax    DVT prophylaxis ; On SQ Heparin           Review of Systems:   General ROS: negative, no weight loss/gain, no fever, no chills, no rigor   ENT ROS: negative   Endocrine ROS: negative, no fatigue, no polydipsia   Respiratory ROS: ++chronic  cough, shortness of breath, or wheezing   Cardiovascular ROS: no chest pain or+ dyspnea on exertion   Gastrointestinal ROS: no abdominal pain, change in bowel habits, or black or bloody stools   Genito-Urinary ROS: no dysuria, trouble voiding, or hematuria   Musculoskeletal ROS: negative   Neurological ROS: no encephalopathy   Dermatological ROS: negative, no rash, no ulcer  Psych:    Cooperative    Past Medical History:   Diagnosis Date   . Anemia 10/02/2016    H/O ANEMIA - REFLECTED ON CURRENT LABS.   [name] Cataracts, bilateral     H/O CATARACTS - SURGERY DONE - STILL HAS SOME ISSUES.   . Cerebrovascular accident     H/O STROKE - WEAKNESS ON LEFT SIDE. NO NEUROLOGIST.    [name] Constipation    . Coronary  artery disease 06/2016    H/O QUADRUPLE BYPASS SURGERY - FOLLOWED BY [facility].   [name] History of MI (myocardial infarction)    . 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 Hypercholesteremia     CONTROLLED WITH MEDS.   [name] Hypertension     CONTROLLED WITH MEDS.   . Pre-diabetes    . Renal insufficiency     CHRONIC KIDNEY DISEASE - FOLLOWED BY DR. Kate Sable.      History   Alcohol Use No      History   Smoking Status   . Never Smoker   Smokeless Tobacco   . Never Used      History   Drug Use No        Physical Exam:   BP 155/64   Pulse 70   Temp 98.8 F (37.1 C)   Resp 18   Ht 1.524 m (5')   Wt 59.4 kg (131 lb)   SpO2 97%  BMI 25.58 kg/m   General appearance - alert, well appearing, and in no distress moderate cough through out   Mental status - alert, oriented to person, place, and time  Eyes - pupils equal and reactive, extraocular eye movements intact, sclera anicteric  Ears - generally normal looking, no errythema  Nose - normal and patent, no erythema, discharge or polyps  Mouth - mucous membranes moist, pharynx normal without lesions and tongue normal  Neck - supple, no significant adenopathy and carotids upstroke normal bilaterally, no bruits  Lymphatics - no palpable lymphadenopathy  Chest - auscultation, no wheezes,  Bilateral coarse  Sounds / / scattered rales or few rhonchi, symmetric air entry  Heart - normal rate and regular rhythm, S1 and S2 normal, P2 not loud , No RV heave  Abdomen - soft, nontender, nondistended, no masses or organomegaly  bowel sounds normal  Neurological - alert, oriented, normal speech, no focal findings or movement disorder noted, neck supple without rigidity, Detail exam not done  Extremities - peripheral pulses normal, no pedal edema, no clubbing or cyanosis,    Skin - normal coloration and turgor, no rashes, no suspicious skin lesions noted     ALL:  Mosquito (Culex Pipiens) Allergy Skin Test  Shellfish-Derived Products  Lisinopril  Penicillins         Meds:      Scheduled Meds:  PRN Meds:      albuterol-ipratropium 3 mL Nebulization Q6H [place]   amLODIPine 10 mg Oral Daily   aspirin EC 325 mg Oral Daily   azithromycin 500 mg Oral Daily   [START ON 04/29/2017] b complex-vitamin c-folic acid 1 tablet Oral Daily at 1000   carvedilol 12.5 mg Oral BID Meals   cefTRIAXone 1 g Intravenous Q24H [place]   cloNIDine 0.1 mg Oral BID   darbepoetin alfa 60 mcg Subcutaneous Weekly   fluticasone-salmeterol 1 puff Inhalation BID   guaiFENesin 600 mg Oral Q12H [place]   heparin (porcine) 5,000 Units Subcutaneous Q8H [facility]   lactobacillus/streptococcus 1 capsule Oral Daily   losartan 25 mg Oral Daily   pantoprazole 40 mg Oral QAM AC   polyethylene glycol 17 g Oral Daily   senna-docusate 2 tablet Oral BID   simvastatin 20 mg Oral QHS   vitamin D 2 tablet Oral Daily         Continuous Infusions:     acetaminophen 650 mg Q4H PRN   acetaminophen 650 mg Q4H PRN   dextrose 15 g of glucose PRN   And     dextrose 12.5 g PRN   And     glucagon (rDNA) 1 mg PRN   insulin lispro 1-3 Units QHS PRN   insulin lispro 1-5 Units TID AC PRN   naloxone 0.2 mg PRN   ondansetron 4 mg Q6H PRN   traMADol 50 mg Q6H PRN         I personally reviewed all of the medications      Labs:     Recent Labs  Lab 04/28/17  0807 04/27/17  0620 04/26/17  0639   Glucose  --  116* 123*   BUN  --  35.5* 58.8*   Creatinine  --  3.2* 4.1*   Calcium  --  8.1* 8.2*   Sodium  --  135* 136   Potassium  --  5.2* 5.5*   Chloride  --  104 109   CO2  --  25 22   Phosphorus 3.7  --  4.2  Magnesium  --   --  2.5   EGFR  --  14.6 10.9               Recent Labs  Lab 04/27/17  0620   WBC 6.04   Hgb 7.5*   Hematocrit 23.9*   MCV 69.9*   MCH 21.9*   MCHC 31.4*   RDW 16*   MPV 10.5   Platelets 230             Invalid input(s): FREET4          Radiology Results (24 Hour)     ** No results found for the last 24 hours. **             Case discussed with:  Team         Danae Chen, MD     08/25/181:23 PM

## 2017-04-28 NOTE — Progress Notes (Signed)
[facility].  Progress Note    Assessment:   ESRD - HD initiated 8/23   DM 2 CKD   Hypertension CKD   Anemia CKD   CAD s/p CABG   Acute bronchitis    Plan:   Next HD Monday   I will simplify her BP medications. Her allergy to Lisinopril is questionable (?hives by our chart) as she cannot recall any issues with the medication.   Awaiting outpatient HD     Francis Dowse, MD  Office [phone] or TigerText  ++++++++++++++++++++++++++++++++++++++++++++++++++++++++++++++  Subjective:  No new complaints    Medications:  Scheduled Meds:  Current Facility-Administered Medications   Medication Dose Route Frequency   . albuterol-ipratropium  3 mL Nebulization Q6H SCH   . amLODIPine  10 mg Oral Daily   . aspirin EC  325 mg Oral Daily   . azithromycin  500 mg Oral Daily   . carvedilol  12.5 mg Oral BID Meals   . cefTRIAXone  1 g Intravenous Q24H SCH   . cloNIDine  0.1 mg Oral BID   . darbepoetin alfa  60 mcg Subcutaneous Weekly   . fluticasone-salmeterol  1 puff Inhalation BID   . guaiFENesin  600 mg Oral Q12H [place]   . heparin (porcine)  5,000 Units Subcutaneous Q8H [place]   . hydrALAZINE  100 mg Oral BID   . lactobacillus/streptococcus  1 capsule Oral Daily   . pantoprazole  40 mg Oral QAM AC   . polyethylene glycol  17 g Oral Daily   . senna-docusate  2 tablet Oral BID   . simvastatin  20 mg Oral QHS   . vitamin D  2 tablet Oral Daily     Continuous Infusions:  PRN Meds:acetaminophen, acetaminophen, Nursing communication: Adult Hypoglycemia Treatment Algorithm **AND** dextrose **AND** dextrose **AND** glucagon (rDNA), insulin lispro, insulin lispro, naloxone, ondansetron, traMADol    Objective:  Vital signs in last 24 hours:  Temp:  [98.1 F (36.7 C)-99.1 F (37.3 C)] 98.8 F (37.1 C)  Heart Rate:  [65-76] 70  Resp Rate:  [14-18] 18  BP: (127-197)/(65-79) 167/66  Intake/Output last 24 hours:    Intake/Output Summary (Last 24 hours) at 04/28/17 1113  Last data filed at 04/28/17  1000   Gross per 24 hour   Intake             1925 ml   Output             1800 ml   Net              125 ml     Intake/Output this shift:  I/O this shift:  In: 56 [P.O.:990]  Out: 300 [Urine:300]    Physical Exam:   Gen: WD F NAD   CV: S1 S2 N RRR   Chest: +Wheezing   Ab: ND NT soft no HSM +BS   Ext: 1+ edema    Access: R IJ tunneled catheter    Labs:    Recent Labs  Lab 04/28/17  0807 04/27/17  0620 04/26/17  0639 04/25/17  1843   Glucose  --  116* 123* 141*   BUN  --  35.5* 58.8* 62.2*   Creatinine  --  3.2* 4.1* 4.1*   Calcium  --  8.1* 8.2* 7.9*   Sodium  --  135* 136 136   Potassium  --  5.2* 5.5* 5.1   Chloride  --  104 109 109   CO2  --  25 22 20*   Phosphorus 3.7  --  4.2  --    Magnesium  --   --  2.5  --        Recent Labs  Lab 04/27/17  0620 04/26/17  0639 04/25/17  1843   WBC 6.04 8.78 9.34   Hgb 7.5* 7.5* 7.3*   Hematocrit 23.9* 23.6* 23.6*   MCV 69.9* 70.4* 71.1*   MCH 21.9* 22.4* 22.0*   MCHC 31.4* 31.8* 30.9*   RDW 16* 16* 16*   MPV 10.5 10.2 9.2*   Platelets 230 250 234

## 2017-04-28 NOTE — Plan of Care (Signed)
Problem: Renal Instability  Goal: Fluid and electrolyte balance are achieved/maintained  Outcome: Progressing    Goal: Nutritional intake is adequate  Outcome: Progressing    Goal: Perineal skin integrity is maintained or improved  Outcome: Progressing    Goal: Free from infection  Outcome: Progressing      Problem: Safety  Goal: Patient will be free from injury during hospitalization  Outcome: Progressing  Attended w/ needs. Call bell w/ in reach. Continue monitoring.  Goal: Patient will be free from infection during hospitalization  Outcome: Progressing      Problem: Pain  Goal: Pain at adequate level as identified by patient  Outcome: Progressing  No sob, dizziness or chest pain noted.  Keep clean and dry. Repositioned.

## 2017-04-28 NOTE — Plan of Care (Signed)
Problem: Patient Receiving Advanced Renal Therapies  Goal: Therapy access site remains intact  Outcome: Progressing   04/28/17 0741   Goal/Interventions addressed this shift   Therapy access site remains intact Assess therapy access site       Problem: Safety  Goal: Patient will be free from injury during hospitalization  Outcome: Progressing   04/28/17 0741   Goal/Interventions addressed this shift   Patient will be free from injury during hospitalization  Assess patient's risk for falls and implement fall prevention plan of care per policy;Provide and maintain safe environment;Include patient/ family/ care giver in decisions related to safety;Hourly rounding;Assess for patients risk for elopement and implement [place] per policy;Ensure appropriate safety devices are available at the bedside;Use appropriate transfer methods       Problem: Pain  Goal: Pain at adequate level as identified by patient  Outcome: Progressing   04/28/17 0741   Goal/Interventions addressed this shift   Pain at adequate level as identified by patient Identify patient comfort function goal;Reassess pain within 30-60 minutes of any procedure/intervention, per Pain Assessment, Intervention, Reassessment (AIR) Cycle;Evaluate if patient comfort function goal is met;Evaluate patient's satisfaction with pain management progress;Offer non-pharmacological pain management interventions

## 2017-04-28 NOTE — Progress Notes (Signed)
[facility] Hospitalist Daily Progress Note        Date Time: 04/28/2017  1:22 PM  Patient Name:Jacqueline Weber  WIO:[id number]  PCP: John Giovanni, MD  Attending Physician:Vega Stare Sherre Poot M.D.      Chief Complaint:   Kidney failure    Subjective:     Jacqueline Weber continues to cough .  Does not feel any better nebulizers only provide temporary relief  Denies any chest pain, denies any new complaints  Denies any abdominal pain - No Nausea, vomiting or diarrhea  No new weakness tingling or numbness.  Assessment/Plan   End-stage renal disease with uremic symptoms : Patient has a right permacath in place  Hemodialysis  initiated by nephrology  Case management working arrangement for outpatient hemodialysis  Acute bronchitisWith significant reactive airway disease: Continue to wheeze.  Restart on IV Solu-Medrol and monitor .  No history of previous tobacco use or asthma  Consults pulmonology  Continue nebulizers and Advair.  Continue Mucinex.  Continue azithromycin and Rocephin   History of coronary artery disease and recent coronary artery bypass graft on June 12, 2016   Continue aspirin, beta blocker and statin  History of CVA in 2014 with mild left lower extremity weakness :  At baseline. Continue aspirin, statin  Diet-controlled diabetes mellitus : Hemoglobin A1c in June was 6.2  Continue low-dose sliding scale and diabetic diet.  Monitor trends while on steroids  Essential hypertension: Continue current medications including Norvasc, Bumex, Coreg, clonidine and hydralazine  Anemia of chronic kidney disease : Continue ferrous sulfate, monitor hemoglobin  Dyslipidemia :Continue statin  Gastroesophageal reflux disease: Continue PPI      DVT Prohylaxis: Heparin   Code Status: Full Code   Prognosis: Guarded  Type of Admission:Inpatient  Central Line/Foley Catheter/PICC line status: none  Estimated Length of Stay (including stay in the ER receiving treatment): > 2  midnights  Medical Necessity for stay: End-stage renal disease  Expected Date of Discharge:  2- 3 days   Allergies:     Allergies   Allergen Reactions   . Mosquito (Culex Pipiens) Allergy Skin Test Shortness Of Breath     And  Trouble  breathing   . Shellfish-Derived Products Hives     New  allergy  New  allergy   . Lisinopril Hives     Patient cannot remember the exact nature of the allergy   . Penicillins Rash     Swelling,  numbness       Physical Exam:   Vitals reviewed:   height is 1.524 m (5') and weight is 59.4 kg (131 lb). Her temperature is 98.8 F (37.1 C). Her blood pressure is 155/64 and her pulse is 70. Her respiration is 18 and oxygen saturation is 97%.   Body mass index is 25.58 kg/m.  Vitals:    04/28/17 0739 04/28/17 0826 04/28/17 0940 04/28/17 1235   BP:  161/67 167/66 155/64   Pulse:  76 70    Resp:  18     Temp:  98.8 F (37.1 C)     TempSrc:       SpO2: 96% 97%     Weight:       Height:         Intake and Output Summary (Last 24 hours) at Date Time    Intake/Output Summary (Last 24 hours) at 04/28/17 1322  Last data filed at 04/28/17 1000   Gross per 24 hour   Intake  1925 ml   Output              700 ml   Net             1225 ml       Exam  Awake Alert, Oriented *3, No new F.N deficits, Normal affect  NC.AT,PERRAL  Supple Neck,No JVD, No cervical lymphadenopathy appriciated.   Symmetrical Chest wall movement, Good air movement bilaterally, Bilateral crackles and diffuse expiratory rhonchi bilaterally  RRR,No Gallops,Rubs or new Murmurs, No Parasternal Heave  +ve B.Sounds, Abd Soft, Non tender, No organomegaly appriciated, No rebound -guarding or rigidity.  Trace bilateral lower extremity edema, No new Rash or bruise    Consult Input/Plan     Nephrology    Medications:     Current Facility-Administered Medications   Medication Dose Route Frequency Last Rate Last Dose   . acetaminophen (TYLENOL) tablet 650 mg  650 mg Oral Q4H PRN       . acetaminophen (TYLENOL) tablet 650 mg  650 mg  Oral Q4H PRN   650 mg at 04/27/17 1424   . albuterol-ipratropium (DUO-NEB) 2.5-0.5(3) mg/3 mL nebulizer 3 mL  3 mL Nebulization Q6H [place]   3 mL at 04/28/17 0739   . amLODIPine (NORVASC) tablet 10 mg  10 mg Oral Daily   10 mg at 04/28/17 0942   . aspirin EC EC tablet 325 mg  325 mg Oral Daily   325 mg at 04/28/17 0943   . azithromycin (ZITHROMAX) tablet 500 mg  500 mg Oral Daily   500 mg at 04/28/17 0940   . [START ON 04/29/2017] b complex-vitamin c-folic acid (NEPHRO-VITE) tablet 1 tablet  1 tablet Oral Daily at 1000       . carvedilol (COREG) tablet 12.5 mg  12.5 mg Oral BID Meals   12.5 mg at 04/28/17 0523   . cefTRIAXone (ROCEPHIN) 1 g in sodium chloride 0.9 % 100 mL IVPB mini-bag plus  1 g Intravenous Q24H [place] 200 mL/hr at 04/28/17 0943 1 g at 04/28/17 0943   . cloNIDine (CATAPRES) tablet 0.1 mg  0.1 mg Oral BID   0.1 mg at 04/28/17 0941   . darbepoetin alfa (ARANESP) injection 60 mcg  60 mcg Subcutaneous Weekly   60 mcg at 04/27/17 0926   . dextrose (GLUCOSE) 40 % oral gel 15 g of glucose  15 g of glucose Oral PRN        And   . dextrose 50 % bolus 12.5 g  12.5 g Intravenous PRN        And   . glucagon (rDNA) (GLUCAGEN) injection 1 mg  1 mg Intramuscular PRN       . fluticasone-salmeterol (ADVAIR DISKUS) 100-50 MCG/DOSE 1 puff  1 puff Inhalation BID   1 puff at 04/28/17 0739   . guaiFENesin (MUCINEX) 12 hr tablet 600 mg  600 mg Oral Q12H [place]   600 mg at 04/28/17 0941   . heparin (porcine) injection 5,000 Units  5,000 Units Subcutaneous Q8H [place]   5,000 Units at 04/28/17 0513   . insulin lispro (HumaLOG) injection 1-3 Units  1-3 Units Subcutaneous QHS PRN   1 Units at 04/26/17 2150   . insulin lispro (HumaLOG) injection 1-5 Units  1-5 Units Subcutaneous TID AC PRN   1 Units at 04/27/17 1725   . lactobacillus/streptococcus (RISAQUAD) capsule 1 capsule  1 capsule Oral Daily   1 capsule at 04/28/17 0940   . losartan (COZAAR) tablet 25 mg  25 mg Oral Daily   25 mg at 04/28/17 1235   . naloxone [facility]) injection 0.2 mg   0.2 mg Intravenous PRN       . ondansetron (ZOFRAN) injection 4 mg  4 mg Intravenous Q6H PRN   4 mg at 04/27/17 1826   . pantoprazole (PROTONIX) EC tablet 40 mg  40 mg Oral QAM AC   40 mg at 04/28/17 0813   . polyethylene glycol (MIRALAX) packet 17 g  17 g Oral Daily   17 g at 04/28/17 0942   . senna-docusate (PERICOLACE) 8.6-50 MG per tablet 2 tablet  2 tablet Oral BID   2 tablet at 04/28/17 0942   . simvastatin (ZOCOR) tablet 20 mg  20 mg Oral QHS   20 mg at 04/27/17 2114   . traMADol (ULTRAM) tablet 50 mg  50 mg Oral Q6H PRN   50 mg at 04/27/17 2114   . vitamin D (cholecalciferol) tablet 2,000 Units  2 tablet Oral Daily   2,000 Units at 04/28/17 0942          Labs:reviewed     Results     Procedure Component Value Units Date/Time    Glucose Whole Blood - POCT [[id number]]  (Abnormal) Collected:  04/28/17 1125     Updated:  04/28/17 1143     POCT - Glucose Whole blood 132 (H) mg/dL     Phosphorus [[id number]] Collected:  04/28/17 0807    Specimen:  Blood Updated:  04/28/17 0844     Phosphorus 3.7 mg/dL     Glucose Whole Blood - POCT [[id number]]  (Abnormal) Collected:  04/28/17 0732     Updated:  04/28/17 0734     POCT - Glucose Whole blood 133 (H) mg/dL     Glucose Whole Blood - POCT [[id number]]  (Abnormal) Collected:  04/27/17 2103     Updated:  04/27/17 2107     POCT - Glucose Whole blood 132 (H) mg/dL     Glucose Whole Blood - POCT [[id number]]  (Abnormal) Collected:  04/27/17 1646     Updated:  04/27/17 1654     POCT - Glucose Whole blood 186 (H) mg/dL     Glucose Whole Blood - POCT [[id number]]  (Abnormal) Collected:  04/27/17 1335     Updated:  04/27/17 1344     POCT - Glucose Whole blood 116 (H) mg/dL           Rads:reviewed   Radiological Procedure reviewed.  Radiology Results (24 Hour)     ** No results found for the last 24 hours. **          Multi-Disciplinary Care Input:   Case management notes: No Patient Care Coordination Note on file.     PT/OT/ST notes:          Signed by: Dirk Dress  04/28/2017  1:22 PM         This note was generated by the Epic EMR system/ Dragon speech recognition and may contain inherent errors or omissions not intended by the user. Grammatical errors, random word insertions, deletions, pronoun errors and incomplete sentences are occasional consequences of this technology due to software limitations. Not all errors are caught or corrected. If there are questions or concerns about the content of this note or information contained within the body of this dictation they should be addressed directly with the author for clarification

## 2017-04-29 LAB — BASIC METABOLIC PANEL
Anion Gap: 11 (ref 5.0–15.0)
BUN: 32.1 mg/dL — ABNORMAL HIGH (ref 7.0–19.0)
CO2: 23 mEq/L (ref 22–29)
Calcium: 8.6 mg/dL (ref 8.5–10.5)
Chloride: 98 mEq/L — ABNORMAL LOW (ref 100–111)
Creatinine: 3.8 mg/dL — ABNORMAL HIGH (ref 0.6–1.0)
Glucose: 207 mg/dL — ABNORMAL HIGH (ref 70–100)
Potassium: 5.2 mEq/L — ABNORMAL HIGH (ref 3.5–5.1)
Sodium: 132 mEq/L — ABNORMAL LOW (ref 136–145)

## 2017-04-29 LAB — SEDIMENTATION RATE: Sed Rate: 81 mm/Hr — ABNORMAL HIGH (ref 0–20)

## 2017-04-29 LAB — CBC
Absolute NRBC: 0 10*3/uL
Hematocrit: 26.2 % — ABNORMAL LOW (ref 37.0–47.0)
Hgb: 8.1 g/dL — ABNORMAL LOW (ref 12.0–16.0)
MCH: 22.1 pg — ABNORMAL LOW (ref 28.0–32.0)
MCHC: 30.9 g/dL — ABNORMAL LOW (ref 32.0–36.0)
MCV: 71.4 fL — ABNORMAL LOW (ref 80.0–100.0)
MPV: 10.6 fL (ref 9.4–12.3)
Nucleated RBC: 0 /100 WBC (ref 0.0–1.0)
Platelets: 223 10*3/uL (ref 140–400)
RBC: 3.67 10*6/uL — ABNORMAL LOW (ref 4.20–5.40)
RDW: 15 % (ref 12–15)
WBC: 6.59 10*3/uL (ref 3.50–10.80)

## 2017-04-29 LAB — GLUCOSE WHOLE BLOOD - POCT
Whole Blood Glucose POCT: 226 mg/dL — ABNORMAL HIGH (ref 70–100)
Whole Blood Glucose POCT: 206 mg/dL — ABNORMAL HIGH (ref 70–100)
Whole Blood Glucose POCT: 249 mg/dL — ABNORMAL HIGH (ref 70–100)
Whole Blood Glucose POCT: 284 mg/dL — ABNORMAL HIGH (ref 70–100)

## 2017-04-29 LAB — C-REACTIVE PROTEIN: C-Reactive Protein: 0.5 mg/dL (ref 0.0–0.8)

## 2017-04-29 LAB — GFR: EGFR: 11.9

## 2017-04-29 MED ORDER — BISACODYL 5 MG PO TBEC
5.0000 mg | DELAYED_RELEASE_TABLET | Freq: Once | ORAL | Status: AC
Start: 2017-04-29 — End: 2017-04-29
  Administered 2017-04-29: 13:00:00 5 mg via ORAL
  Filled 2017-04-29: qty 1

## 2017-04-29 MED ORDER — ALTEPLASE 2 MG IJ SOLR
2.0000 mg | INTRAMUSCULAR | Status: AC | PRN
Start: 2017-04-29 — End: 2017-04-29

## 2017-04-29 MED ORDER — HYDRALAZINE HCL 20 MG/ML IJ SOLN
10.0000 mg | Freq: Four times a day (QID) | INTRAMUSCULAR | Status: DC | PRN
Start: 2017-04-29 — End: 2017-05-01
  Administered 2017-04-29 – 2017-05-01 (×4): 10 mg via INTRAVENOUS
  Filled 2017-04-29 (×4): qty 1

## 2017-04-29 MED ORDER — SODIUM CHLORIDE 0.9 % IV BOLUS
100.0000 mL | INTRAVENOUS | Status: AC | PRN
Start: 2017-04-29 — End: 2017-04-29

## 2017-04-29 MED ORDER — SODIUM CHLORIDE 0.9 % IV BOLUS
250.0000 mL | INTRAVENOUS | Status: AC | PRN
Start: 2017-04-29 — End: 2017-04-29

## 2017-04-29 MED ORDER — HEPARIN SODIUM (PORCINE) 1000 UNIT/ML IJ SOLN
2000.0000 [IU] | INTRAMUSCULAR | Status: AC | PRN
Start: 2017-04-29 — End: 2017-04-29
  Filled 2017-04-29: qty 2

## 2017-04-29 MED ORDER — ALBUMIN HUMAN 25 % IV SOLN
100.0000 mL | INTRAVENOUS | Status: AC | PRN
Start: 2017-04-29 — End: 2017-04-29
  Filled 2017-04-29: qty 100

## 2017-04-29 NOTE — Progress Notes (Signed)
[facility] Hospitalist Daily Progress Note        Date Time: 04/29/2017  1:25 PM  Patient Name:Jacqueline Weber  VQW:[id number]  PCP: John Giovanni, MD  Attending Physician:Ryken Paschal Sherre Poot M.D.      Chief Complaint:   Kidney failure    Subjective:     Jacqueline Weber feels better.  Shortness of breath and cough significantly improved  Denies any chest pain, denies any new complaints  Denies any abdominal pain - No Nausea, vomiting or diarrhea  No new weakness tingling or numbness.  Assessment/Plan   End-stage renal disease with uremic symptoms : Patient has a right permacath in place  Hemodialysis  initiated by nephrology  Case management working arrangement for outpatient hemodialysis  Acute bronchitisWith significant reactive airway disease: Significantly improved after initiation of steroids   Continue Solu-Medrol ; pulmonology following  Continue nebulizers and Advair.  Continue Mucinex.  Continue azithromycin and Rocephin   Abnormal stomach wall thickening on CT scan of the abdomen:  Continue Protonix briefly discussed and consulted Dr. Caryn Section from gastroenterology who recommended to keep patient nothing by mouth for possible EGD in the morning  I tried to call her daughter was not able to get hold of her anemia.  However, she just called back and mentioned that the patient has had endoscopy and colonoscopy done in January 2018  Report reviewed from October 04, 2016 showing gastritis, hiatal hernia, antral erosions and duodenitis and 2nd portion of duodenum with mild erythema and cecum.  Pathology showed with foveolar hyperplasia and focal intestinal metaplasia. No dysplasia  Await further recommendations by gastroenterology  History of coronary artery disease and recent coronary artery bypass graft on June 12, 2016   Continue aspirin, beta blocker and statin  History of CVA in 2014 with mild left lower extremity weakness :  At baseline. Continue aspirin,  statin  Diet-controlled diabetes mellitus : Hemoglobin A1c in June was 6.2  Continue low-dose sliding scale and diabetic diet.  Monitor trends while on steroids  Essential hypertension: Continue current medications including Norvasc, Bumex, Coreg, clonidine and hydralazine  Anemia of chronic kidney disease : Continue ferrous sulfate, monitor hemoglobin  Dyslipidemia :Continue statin  Gastroesophageal reflux disease: Continue PPI      DVT Prohylaxis: Heparin   Code Status: Full Code   Prognosis: Guarded  Type of Admission:Inpatient  Central Line/Foley Catheter/PICC line status: none  Estimated Length of Stay (including stay in the ER receiving treatment): > 2 midnights  Medical Necessity for stay: End-stage renal disease  Expected Date of Discharge:  2- 3 days   Allergies:     Allergies   Allergen Reactions   . Mosquito (Culex Pipiens) Allergy Skin Test Shortness Of Breath     And  Trouble  breathing   . Shellfish-Derived Products Hives     New  allergy  New  allergy   . Lisinopril Hives     Patient cannot remember the exact nature of the allergy   . Penicillins Rash     Swelling,  numbness       Physical Exam:   Vitals reviewed:   height is 1.524 m (5') and weight is 59.4 kg (131 lb). Her oral temperature is 97.2 F (36.2 C). Her blood pressure is 174/70 and her pulse is 78. Her respiration is 18 and oxygen saturation is 97%.   Body mass index is 25.58 kg/m.  Vitals:    04/29/17 0853 04/29/17 1003 04/29/17 1004 04/29/17 1253   BP: 158/65 162/86  174/70   Pulse: 84  83 78   Resp: 18   18   Temp: 98.4 F (36.9 C)   97.2 F (36.2 C)   TempSrc: Oral   Oral   SpO2: 95%   97%   Weight:       Height:         Intake and Output Summary (Last 24 hours) at Date Time    Intake/Output Summary (Last 24 hours) at 04/29/17 1325  Last data filed at 04/29/17 0853   Gross per 24 hour   Intake             1990 ml   Output              450 ml   Net             1540 ml       Exam  Awake Alert, Oriented *3, No new F.N deficits, Normal  affect  NC.AT,PERRAL  Supple Neck,No JVD, No cervical lymphadenopathy appriciated.   Symmetrical Chest wall movement, Good air movement bilaterally, Bilateral crackles and diffuse expiratory rhonchi bilaterally  RRR,No Gallops,Rubs or new Murmurs, No Parasternal Heave  +ve B.Sounds, Abd Soft, Non tender, No organomegaly appriciated, No rebound -guarding or rigidity.  Trace bilateral lower extremity edema, No new Rash or bruise    Consult Input/Plan     Nephrology    Medications:     Current Facility-Administered Medications   Medication Dose Route Frequency Last Rate Last Dose   . acetaminophen (TYLENOL) tablet 650 mg  650 mg Oral Q4H PRN       . acetaminophen (TYLENOL) tablet 650 mg  650 mg Oral Q4H PRN   650 mg at 04/27/17 1424   . acetylcysteine (MUCOMYST) 20 % nebulizer solution 1.5 mL  1.5 mL Nebulization Q6H [place]   1.5 mL at 04/29/17 0714   . albumin human 25 % bag 100 mL  100 mL Intravenous PRN       . albuterol-ipratropium (DUO-NEB) 2.5-0.5(3) mg/3 mL nebulizer 3 mL  3 mL Nebulization Q6H [place]   3 mL at 04/29/17 0714   . alteplase (CATH-FLO) injection 2 mg  2 mg Intracatheter PRN       . amLODIPine (NORVASC) tablet 10 mg  10 mg Oral Daily   10 mg at 04/29/17 1004   . aspirin EC EC tablet 325 mg  325 mg Oral Daily   325 mg at 04/29/17 1003   . azithromycin (ZITHROMAX) tablet 500 mg  500 mg Oral Daily   500 mg at 04/29/17 1003   . b complex-vitamin c-folic acid (NEPHRO-VITE) tablet 1 tablet  1 tablet Oral Daily at 1000   1 tablet at 04/29/17 1007   . carvedilol (COREG) tablet 12.5 mg  12.5 mg Oral BID Meals   12.5 mg at 04/29/17 0804   . cefTRIAXone (ROCEPHIN) 1 g in sodium chloride 0.9 % 100 mL IVPB mini-bag plus  1 g Intravenous Q24H [place] 200 mL/hr at 04/29/17 1007 1 g at 04/29/17 1007   . darbepoetin alfa (ARANESP) injection 60 mcg  60 mcg Subcutaneous Weekly   60 mcg at 04/27/17 0926   . dextrose (GLUCOSE) 40 % oral gel 15 g of glucose  15 g of glucose Oral PRN        And   . dextrose 50 % bolus 12.5 g  12.5  g Intravenous PRN        And   . glucagon (rDNA) (GLUCAGEN) injection  1 mg  1 mg Intramuscular PRN       . fluticasone-salmeterol (ADVAIR DISKUS) 100-50 MCG/DOSE 1 puff  1 puff Inhalation BID   1 puff at 04/29/17 0715   . guaiFENesin (MUCINEX) 12 hr tablet 600 mg  600 mg Oral Q12H [place]   600 mg at 04/29/17 1005   . heparin (porcine) injection 2,000 Units  2,000 Units Intracatheter PRN       . heparin (porcine) injection 5,000 Units  5,000 Units Subcutaneous Q8H [place]   5,000 Units at 04/29/17 0602   . insulin lispro (HumaLOG) injection 1-3 Units  1-3 Units Subcutaneous QHS PRN   1 Units at 04/28/17 2221   . insulin lispro (HumaLOG) injection 1-5 Units  1-5 Units Subcutaneous TID AC PRN   2 Units at 04/29/17 1234   . lactobacillus/streptococcus (RISAQUAD) capsule 1 capsule  1 capsule Oral Daily   1 capsule at 04/29/17 1004   . losartan (COZAAR) tablet 50 mg  50 mg Oral Daily   50 mg at 04/29/17 1004   . methylPREDNISolone sodium succinate (Solu-MEDROL) injection 20 mg  20 mg Intravenous TID   20 mg at 04/29/17 1006   . naloxone (NARCAN) injection 0.2 mg  0.2 mg Intravenous PRN       . ondansetron (ZOFRAN) injection 4 mg  4 mg Intravenous Q6H PRN   4 mg at 04/27/17 1826   . pantoprazole (PROTONIX) EC tablet 40 mg  40 mg Oral QAM AC   40 mg at 04/29/17 0805   . polyethylene glycol (MIRALAX) packet 17 g  17 g Oral Daily   17 g at 04/29/17 1006   . senna-docusate (PERICOLACE) 8.6-50 MG per tablet 2 tablet  2 tablet Oral BID   2 tablet at 04/29/17 1006   . simvastatin (ZOCOR) tablet 20 mg  20 mg Oral QHS   20 mg at 04/28/17 2224   . sodium chloride 0.9 % bolus 100 mL  100 mL Intravenous Q1H PRN       . sodium chloride 0.9 % bolus 250 mL  250 mL Intravenous PRN       . traMADol (ULTRAM) tablet 50 mg  50 mg Oral Q6H PRN   50 mg at 04/27/17 2114   . vitamin D (cholecalciferol) tablet 2,000 Units  2 tablet Oral Daily   2,000 Units at 04/29/17 1004          Labs:reviewed     Results     Procedure Component Value Units Date/Time     Glucose Whole Blood - POCT [[id number]]  (Abnormal) Collected:  04/29/17 1103     Updated:  04/29/17 1107     POCT - Glucose Whole blood 206 (H) mg/dL     GFR [[id number]] Collected:  04/29/17 0854     Updated:  04/29/17 0942     EGFR 66.2    Basic Metabolic Panel [[id number]]  (Abnormal) Collected:  04/29/17 0854    Specimen:  Blood Updated:  04/29/17 0942     Glucose 207 (H) mg/dL      BUN 32.1 (H) mg/dL      Creatinine 3.8 (H) mg/dL      Calcium 8.6 mg/dL      Sodium 132 (L) mEq/L      Potassium 5.2 (H) mEq/L      Chloride 98 (L) mEq/L      CO2 23 mEq/L      Anion Gap 11.0    CBC without differential [[id number]]  (Abnormal) Collected:  04/29/17 0854    Specimen:  Blood from Blood Updated:  04/29/17 0910     WBC 6.59 x10 3/uL      Hgb 8.1 (L) g/dL      Hematocrit 26.2 (L) %      Platelets 223 x10 3/uL      RBC 3.67 (L) x10 6/uL      MCV 71.4 (L) fL      MCH 22.1 (L) pg      MCHC 30.9 (L) g/dL      RDW 15 %      MPV 10.6 fL      Nucleated RBC 0.0 /100 WBC      Absolute NRBC 0.00 x10 3/uL     Glucose Whole Blood - POCT [[id number]]  (Abnormal) Collected:  04/29/17 0757     Updated:  04/29/17 0800     POCT - Glucose Whole blood 226 (H) mg/dL     Glucose Whole Blood - POCT [[id number]]  (Abnormal) Collected:  04/28/17 2141     Updated:  04/28/17 2143     POCT - Glucose Whole blood 219 (H) mg/dL     Glucose Whole Blood - POCT [[id number]]  (Abnormal) Collected:  04/28/17 1627     Updated:  04/28/17 1630     POCT - Glucose Whole blood 195 (H) mg/dL     Potassium [[id number]] Collected:  04/28/17 1416    Specimen:  Blood Updated:  04/28/17 1447     Potassium 4.3 mEq/L           Rads:reviewed   Radiological Procedure reviewed.  Radiology Results (24 Hour)     Procedure Component Value Units Date/Time    CT Chest High Resolution [[id number]] Collected:  04/28/17 1743    Order Status:  Completed Updated:  04/28/17 1816    Narrative:       CT OF THE CHEST WITH HIGH RESOLUTION AND WITHOUT INTRAVENOUS CONTRAST.     CLINICAL  HISTORY: Shortness of breath. Evaluate for interstitial lung  disease.    COMPARISON: Chest x-ray from 04/26/2017.    TECHNIQUE:  CT of the chest was performed without intravenous contrast.  The following dose reduction techniques were utilized: Automated  exposure control and/or adjustment of the mA and/or kV according to  patient size, and the use of iterative reconstruction technique.    FINDINGS:   The central airways are patent. The heart is enlarged. No pericardial  effusion. Coronary artery and aortic atherosclerotic calcifications are  seen. The aorta is normal in course and caliber. There is a mildly  enlarged precarinal lymph node measuring 1.7 x 1.1 cm which may be  reactive.    No focal consolidation or pneumothorax. There is a small left pleural  effusion. No reticular opacification or bronchiectasis. There is minimal  atelectasis in the periphery of the right middle and lingula. There is  mild body wall edema. No acute bony findings.    For unenhanced technique the visualized liver and spleen are within  normal limits in appearance. There is circumferential wall thickening of  the stomach which is also underdistended.      Impression:         1. No evidence of interstitial lung disease.  2. Pronounced circumferential wall thickening of the stomach which is  not well evaluated on this exam without oral and intravenous contrast.  Gastritis could account for this appearance. Gastric malignancy is also  within the differential diagnosis. This could be further evaluated with  a  CT of  the abdomen and pelvis with oral and intravenous contrast as  clinically indicated.  3. Small left pleural effusion.    Lorenda Hatchet, MD   04/28/2017 6:12 PM          Multi-Disciplinary Care Input:   Case management notes: No Patient Care Coordination Note on file.     PT/OT/ST notes:          Signed by: Dirk Dress  04/29/2017 1:25 PM         This note was generated by the Epic EMR system/ Dragon speech recognition  and may contain inherent errors or omissions not intended by the user. Grammatical errors, random word insertions, deletions, pronoun errors and incomplete sentences are occasional consequences of this technology due to software limitations. Not all errors are caught or corrected. If there are questions or concerns about the content of this note or information contained within the body of this dictation they should be addressed directly with the author for clarification

## 2017-04-29 NOTE — Progress Notes (Signed)
Pulmonary   Note    Date Time: 04/29/17 5:30 PM  Patient Name: Jacqueline Weber  Attending Physician: Dirk Dress, MD      Subjective:   Patient Seen and Examined. The notes, labs and  X-rays  were reviewed.     ++cough  Sob  HRCT chest reviewed  Discussed      HRCT chest reviewed :  Mild bronchiectasis / small effusion Lt side and thick stomach as described    Assessment:     ESRD / ARF  Chronic / persistent cough x 5-6 month  Acute bronchitis   R/O Bronchiectasis /? Chronic Lt base infiltrate   HTN  CAD   H/o CVA   Anemia   GERD         Plan:n:          HRCT chest  - reviewed   Check :  ESR  CRP  Immunoglobulin levels  ACE      Continue :   Mucomyst  Flutter valve   Flonase    Persistent cough   If no better and remains symptomatic -- would consider bronch   Consider CT abdomen with oral contrast -- will be unable to give IV contrast   Vs EGD  For direct evaluation ?       Continue Duoneb  IV Solumedrol  Advair disc   Protonix     IV Rocephin + Zithromax    DVT prophylaxis ; On SQ Heparin           Review of Systems:   General ROS: negative, no weight loss/gain, no fever, no chills, no rigor   ENT ROS: negative   Endocrine ROS: negative, no fatigue, no polydipsia   Respiratory ROS: ++chronic  cough, shortness of breath, or wheezing   Cardiovascular ROS: no chest pain or+ dyspnea on exertion   Gastrointestinal ROS: no abdominal pain, change in bowel habits, or black or bloody stools   Genito-Urinary ROS: no dysuria, trouble voiding, or hematuria   Musculoskeletal ROS: negative   Neurological ROS: no encephalopathy   Dermatological ROS: negative, no rash, no ulcer  Psych:    Cooperative    Past Medical History:   Diagnosis Date   . Anemia 10/02/2016    H/O ANEMIA - REFLECTED ON CURRENT LABS.   [name] Cataracts, bilateral     H/O CATARACTS - SURGERY DONE - STILL HAS SOME ISSUES.   . Cerebrovascular accident     H/O STROKE - WEAKNESS ON LEFT SIDE. NO NEUROLOGIST.    [name] Constipation    . Coronary artery disease  06/2016    H/O QUADRUPLE BYPASS SURGERY - FOLLOWED BY [facility].   [name] History of MI (myocardial infarction)    . 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 Hypercholesteremia     CONTROLLED WITH MEDS.   [name] Hypertension     CONTROLLED WITH MEDS.   . Pre-diabetes    . Renal insufficiency     CHRONIC KIDNEY DISEASE - FOLLOWED BY DR. Kate Sable.      History   Alcohol Use No      History   Smoking Status   . Never Smoker   Smokeless Tobacco   . Never Used      History   Drug Use No        Physical Exam:   BP 166/66   Pulse 87   Temp 98.1 F (36.7 C) (Oral)   Resp 18  Ht 1.524 m (5')   Wt 59.4 kg (131 lb)   SpO2 96%   BMI 25.58 kg/m   General appearance - alert, well appearing, and in no distress moderate cough through out   Mental status - alert, oriented to person, place, and time  Eyes - pupils equal and reactive, extraocular eye movements intact, sclera anicteric  Ears - generally normal looking, no errythema  Nose - normal and patent, no erythema, discharge or polyps  Mouth - mucous membranes moist, pharynx normal without lesions and tongue normal  Neck - supple, no significant adenopathy and carotids upstroke normal bilaterally, no bruits  Lymphatics - no palpable lymphadenopathy  Chest - auscultation, no wheezes,  Bilateral coarse  Sounds / / scattered rales or few rhonchi, symmetric air entry  Heart - normal rate and regular rhythm, S1 and S2 normal, P2 not loud , No RV heave  Abdomen - soft, nontender, nondistended, no masses or organomegaly  bowel sounds normal  Neurological - alert, oriented, normal speech, no focal findings or movement disorder noted, neck supple without rigidity, Detail exam not done  Extremities - peripheral pulses normal, no pedal edema, no clubbing or cyanosis,    Skin - normal coloration and turgor, no rashes, no suspicious skin lesions noted     ALL:  Mosquito (Culex Pipiens) Allergy Skin Test  Shellfish-Derived Products  Lisinopril  Penicillins         Meds:      Scheduled Meds: PRN Meds:         acetylcysteine 1.5 mL Nebulization Q6H [place]   albuterol-ipratropium 3 mL Nebulization Q6H [place]   amLODIPine 10 mg Oral Daily   aspirin EC 325 mg Oral Daily   azithromycin 500 mg Oral Daily   b complex-vitamin c-folic acid 1 tablet Oral Daily at 1000   carvedilol 12.5 mg Oral BID Meals   cefTRIAXone 1 g Intravenous Q24H [facility]   darbepoetin alfa 60 mcg Subcutaneous Weekly   fluticasone-salmeterol 1 puff Inhalation BID   guaiFENesin 600 mg Oral Q12H [place]   heparin (porcine) 5,000 Units Subcutaneous Q8H [facility]   lactobacillus/streptococcus 1 capsule Oral Daily   losartan 50 mg Oral Daily   methylPREDNISolone 20 mg Intravenous TID   pantoprazole 40 mg Oral QAM AC   polyethylene glycol 17 g Oral Daily   senna-docusate 2 tablet Oral BID   simvastatin 20 mg Oral QHS   vitamin D 2 tablet Oral Daily         Continuous Infusions:     acetaminophen 650 mg Q4H PRN   acetaminophen 650 mg Q4H PRN   dextrose 15 g of glucose PRN   And     dextrose 12.5 g PRN   And     glucagon (rDNA) 1 mg PRN   hydrALAZINE 10 mg Q6H PRN   insulin lispro 1-3 Units QHS PRN   insulin lispro 1-5 Units TID AC PRN   naloxone 0.2 mg PRN   ondansetron 4 mg Q6H PRN   traMADol 50 mg Q6H PRN         I personally reviewed all of the medications      Labs:     Recent Labs  Lab 04/29/17  0854  04/28/17  0807  04/26/17  0639   Glucose 207*  --   --  More results in Results Review 123*   BUN 32.1*  --   --  More results in Results Review 58.8*   Creatinine 3.8*  --   --  More results in Results Review 4.1*   Calcium 8.6  --   --  More results in Results Review 8.2*   Sodium 132*  --   --  More results in Results Review 136   Potassium 5.2* More results in Results Review  --  More results in Results Review 5.5*   Chloride 98*  --   --  More results in Results Review 109   CO2 23  --   --  More results in Results Review 22   Phosphorus  --   --  3.7  --  4.2   Magnesium  --   --   --   --  2.5   EGFR 11.9  --   --  More results in Results Review 10.9   More results in  Results Review = values in this interval not displayed.            Recent Labs  Lab 04/29/17  0854   WBC 6.59   Hgb 8.1*   Hematocrit 26.2*   MCV 71.4*   MCH 22.1*   MCHC 30.9*   RDW 15   MPV 10.6   Platelets 223             Invalid input(s): FREET4          Radiology Results (24 Hour)     Procedure Component Value Units Date/Time    CT Chest High Resolution [[id number]] Collected:  04/28/17 1743    Order Status:  Completed Updated:  04/28/17 1816    Narrative:       CT OF THE CHEST WITH HIGH RESOLUTION AND WITHOUT INTRAVENOUS CONTRAST.     CLINICAL HISTORY: Shortness of breath. Evaluate for interstitial lung  disease.    COMPARISON: Chest x-ray from 04/26/2017.    TECHNIQUE:  CT of the chest was performed without intravenous contrast.  The following dose reduction techniques were utilized: Automated  exposure control and/or adjustment of the mA and/or kV according to  patient size, and the use of iterative reconstruction technique.    FINDINGS:   The central airways are patent. The heart is enlarged. No pericardial  effusion. Coronary artery and aortic atherosclerotic calcifications are  seen. The aorta is normal in course and caliber. There is a mildly  enlarged precarinal lymph node measuring 1.7 x 1.1 cm which may be  reactive.    No focal consolidation or pneumothorax. There is a small left pleural  effusion. No reticular opacification or bronchiectasis. There is minimal  atelectasis in the periphery of the right middle and lingula. There is  mild body wall edema. No acute bony findings.    For unenhanced technique the visualized liver and spleen are within  normal limits in appearance. There is circumferential wall thickening of  the stomach which is also underdistended.      Impression:         1. No evidence of interstitial lung disease.  2. Pronounced circumferential wall thickening of the stomach which is  not well evaluated on this exam without oral and intravenous contrast.  Gastritis could account for  this appearance. Gastric malignancy is also  within the differential diagnosis. This could be further evaluated with  a CT of  the abdomen and pelvis with oral and intravenous contrast as  clinically indicated.  3. Small left pleural effusion.    Lorenda Hatchet, MD   04/28/2017 6:12 PM             Case  discussed with:  Team         Danae Chen, MD     08/26/185:30 PM

## 2017-04-29 NOTE — Consults (Deleted)
Pulmonary Consult  Note    Date Time: 04/29/17 12:12 PM  Patient Name: PYKDXIPJA,SNKNLZ  Attending Physician: Dirk Dress, MD      Subjective:   Patient Seen and Examined. The notes, labs and  X-rays  were reviewed.     ++cough  Sob  HRCT chest reviewed  Discussed      HRCT chest reviewed :  Mild bronchiectasis / small effusion Lt side and thick stomach as described    Assessment:     ESRD / ARF  Chronic / persistent cough x 5-6 month  Acute bronchitis   R/O Bronchiectasis /? Chronic Lt base infiltrate   HTN  CAD   H/o CVA   Anemia   GERD         Plan:n:          HRCT chest  - reviewed   Check :  ESR  CRP  Immunoglobulin levels  ACE      Continue :   Mucomyst  Flutter valve   Flonase    Persistent cough   If no better and remains symptomatic -- would consider bronch   Consider CT abdomen with oral contrast -- will be unable to give IV contrast   Vs EGD  For direct evaluation ?       Continue Duoneb  IV Solumedrol  Advair disc   Protonix     IV Rocephin + Zithromax    DVT prophylaxis ; On SQ Heparin           Review of Systems:   General ROS: negative, no weight loss/gain, no fever, no chills, no rigor   ENT ROS: negative   Endocrine ROS: negative, no fatigue, no polydipsia   Respiratory ROS: ++chronic  cough, shortness of breath, or wheezing   Cardiovascular ROS: no chest pain or+ dyspnea on exertion   Gastrointestinal ROS: no abdominal pain, change in bowel habits, or black or bloody stools   Genito-Urinary ROS: no dysuria, trouble voiding, or hematuria   Musculoskeletal ROS: negative   Neurological ROS: no encephalopathy   Dermatological ROS: negative, no rash, no ulcer  Psych:    Cooperative    Past Medical History:   Diagnosis Date   . Anemia 10/02/2016    H/O ANEMIA - REFLECTED ON CURRENT LABS.   [name] Cataracts, bilateral     H/O CATARACTS - SURGERY DONE - STILL HAS SOME ISSUES.   . Cerebrovascular accident     H/O STROKE - WEAKNESS ON LEFT SIDE. NO NEUROLOGIST.    [name] Constipation    . Coronary artery  disease 06/2016    H/O QUADRUPLE BYPASS SURGERY - FOLLOWED BY [facility].   [name] History of MI (myocardial infarction)    . 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 Hypercholesteremia     CONTROLLED WITH MEDS.   [name] Hypertension     CONTROLLED WITH MEDS.   . Pre-diabetes    . Renal insufficiency     CHRONIC KIDNEY DISEASE - FOLLOWED BY DR. Kate Sable.      History   Alcohol Use No      History   Smoking Status   . Never Smoker   Smokeless Tobacco   . Never Used      History   Drug Use No        Physical Exam:   BP 162/86   Pulse 83   Temp 98.4 F (36.9 C) (Oral)   Resp 18  Ht 1.524 m (5')   Wt 59.4 kg (131 lb)   SpO2 95%   BMI 25.58 kg/m   General appearance - alert, well appearing, and in no distress moderate cough through out   Mental status - alert, oriented to person, place, and time  Eyes - pupils equal and reactive, extraocular eye movements intact, sclera anicteric  Ears - generally normal looking, no errythema  Nose - normal and patent, no erythema, discharge or polyps  Mouth - mucous membranes moist, pharynx normal without lesions and tongue normal  Neck - supple, no significant adenopathy and carotids upstroke normal bilaterally, no bruits  Lymphatics - no palpable lymphadenopathy  Chest - auscultation, no wheezes,  Bilateral coarse  Sounds / / scattered rales or few rhonchi, symmetric air entry  Heart - normal rate and regular rhythm, S1 and S2 normal, P2 not loud , No RV heave  Abdomen - soft, nontender, nondistended, no masses or organomegaly  bowel sounds normal  Neurological - alert, oriented, normal speech, no focal findings or movement disorder noted, neck supple without rigidity, Detail exam not done  Extremities - peripheral pulses normal, no pedal edema, no clubbing or cyanosis,    Skin - normal coloration and turgor, no rashes, no suspicious skin lesions noted     ALL:  Mosquito (Culex Pipiens) Allergy Skin Test  Shellfish-Derived Products  Lisinopril  Penicillins         Meds:      Scheduled Meds:  PRN Meds:        acetylcysteine 1.5 mL Nebulization Q6H [place]   albuterol-ipratropium 3 mL Nebulization Q6H [place]   amLODIPine 10 mg Oral Daily   aspirin EC 325 mg Oral Daily   azithromycin 500 mg Oral Daily   b complex-vitamin c-folic acid 1 tablet Oral Daily at 1000   bisacodyl 5 mg Oral Once   carvedilol 12.5 mg Oral BID Meals   cefTRIAXone 1 g Intravenous Q24H [facility]   darbepoetin alfa 60 mcg Subcutaneous Weekly   fluticasone-salmeterol 1 puff Inhalation BID   guaiFENesin 600 mg Oral Q12H [place]   heparin (porcine) 5,000 Units Subcutaneous Q8H [facility]   lactobacillus/streptococcus 1 capsule Oral Daily   losartan 50 mg Oral Daily   methylPREDNISolone 20 mg Intravenous TID   pantoprazole 40 mg Oral QAM AC   polyethylene glycol 17 g Oral Daily   senna-docusate 2 tablet Oral BID   simvastatin 20 mg Oral QHS   vitamin D 2 tablet Oral Daily         Continuous Infusions:     acetaminophen 650 mg Q4H PRN   acetaminophen 650 mg Q4H PRN   albumin human 100 mL PRN   alteplase 2 mg PRN   dextrose 15 g of glucose PRN   And     dextrose 12.5 g PRN   And     glucagon (rDNA) 1 mg PRN   heparin (porcine) 2,000 Units PRN   insulin lispro 1-3 Units QHS PRN   insulin lispro 1-5 Units TID AC PRN   naloxone 0.2 mg PRN   ondansetron 4 mg Q6H PRN   sodium chloride 100 mL Q1H PRN   sodium chloride 250 mL PRN   traMADol 50 mg Q6H PRN         I personally reviewed all of the medications      Labs:     Recent Labs  Lab 04/29/17  0854  04/28/17  0807  04/26/17  0639   Glucose 207*  --   --  More results in Results Review 123*   BUN 32.1*  --   --  More results in Results Review 58.8*   Creatinine 3.8*  --   --  More results in Results Review 4.1*   Calcium 8.6  --   --  More results in Results Review 8.2*   Sodium 132*  --   --  More results in Results Review 136   Potassium 5.2* More results in Results Review  --  More results in Results Review 5.5*   Chloride 98*  --   --  More results in Results Review 109   CO2 23  --   --  More results in Results  Review 22   Phosphorus  --   --  3.7  --  4.2   Magnesium  --   --   --   --  2.5   EGFR 11.9  --   --  More results in Results Review 10.9   More results in Results Review = values in this interval not displayed.            Recent Labs  Lab 04/29/17  0854   WBC 6.59   Hgb 8.1*   Hematocrit 26.2*   MCV 71.4*   MCH 22.1*   MCHC 30.9*   RDW 15   MPV 10.6   Platelets 223             Invalid input(s): FREET4          Radiology Results (24 Hour)     Procedure Component Value Units Date/Time    CT Chest High Resolution [[id number]] Collected:  04/28/17 1743    Order Status:  Completed Updated:  04/28/17 1816    Narrative:       CT OF THE CHEST WITH HIGH RESOLUTION AND WITHOUT INTRAVENOUS CONTRAST.     CLINICAL HISTORY: Shortness of breath. Evaluate for interstitial lung  disease.    COMPARISON: Chest x-ray from 04/26/2017.    TECHNIQUE:  CT of the chest was performed without intravenous contrast.  The following dose reduction techniques were utilized: Automated  exposure control and/or adjustment of the mA and/or kV according to  patient size, and the use of iterative reconstruction technique.    FINDINGS:   The central airways are patent. The heart is enlarged. No pericardial  effusion. Coronary artery and aortic atherosclerotic calcifications are  seen. The aorta is normal in course and caliber. There is a mildly  enlarged precarinal lymph node measuring 1.7 x 1.1 cm which may be  reactive.    No focal consolidation or pneumothorax. There is a small left pleural  effusion. No reticular opacification or bronchiectasis. There is minimal  atelectasis in the periphery of the right middle and lingula. There is  mild body wall edema. No acute bony findings.    For unenhanced technique the visualized liver and spleen are within  normal limits in appearance. There is circumferential wall thickening of  the stomach which is also underdistended.      Impression:         1. No evidence of interstitial lung disease.  2. Pronounced  circumferential wall thickening of the stomach which is  not well evaluated on this exam without oral and intravenous contrast.  Gastritis could account for this appearance. Gastric malignancy is also  within the differential diagnosis. This could be further evaluated with  a CT of  the abdomen and pelvis with oral and intravenous contrast as  clinically  indicated.  3. Small left pleural effusion.    Lorenda Hatchet, MD   04/28/2017 6:12 PM             Case discussed with:  Team         Danae Chen, MD     04/30/1811:12 PM

## 2017-04-29 NOTE — Progress Notes (Signed)
[facility].  Progress Note    Assessment:   ESRD - HD initiated 8/23   DM 2 CKD   Hypertension CKD   Anemia CKD   CAD s/p CABG   Acute bronchitis    Plan:   Next HD tomorrow   Awaiting outpatient HD     Francis Dowse, MD  Office [phone] or [employer]  ++++++++++++++++++++++++++++++++++++++++++++++++++++++++++++++  Subjective:  No new complaints    Medications:  Scheduled Meds:  Current Facility-Administered Medications   Medication Dose Route Frequency   . acetylcysteine  1.5 mL Nebulization Q6H [place]   . albuterol-ipratropium  3 mL Nebulization Q6H [facility]   . amLODIPine  10 mg Oral Daily   . aspirin EC  325 mg Oral Daily   . azithromycin  500 mg Oral Daily   . b complex-vitamin c-folic acid  1 tablet Oral Daily at 1000   . carvedilol  12.5 mg Oral BID Meals   . cefTRIAXone  1 g Intravenous Q24H [facility]   . darbepoetin alfa  60 mcg Subcutaneous Weekly   . fluticasone-salmeterol  1 puff Inhalation BID   . guaiFENesin  600 mg Oral Q12H [place]   . heparin (porcine)  5,000 Units Subcutaneous Q8H [facility]   . lactobacillus/streptococcus  1 capsule Oral Daily   . losartan  50 mg Oral Daily   . methylPREDNISolone  20 mg Intravenous TID   . pantoprazole  40 mg Oral QAM AC   . polyethylene glycol  17 g Oral Daily   . senna-docusate  2 tablet Oral BID   . simvastatin  20 mg Oral QHS   . vitamin D  2 tablet Oral Daily     Continuous Infusions:  PRN Meds:acetaminophen, acetaminophen, Nursing communication: Adult Hypoglycemia Treatment Algorithm **AND** dextrose **AND** dextrose **AND** glucagon (rDNA), insulin lispro, insulin lispro, naloxone, ondansetron, traMADol    Objective:  Vital signs in last 24 hours:  Temp:  [98.1 F (36.7 C)-98.6 F (37 C)] 98.4 F (36.9 C)  Heart Rate:  [70-88] 83  Resp Rate:  [16-20] 18  BP: (151-177)/(64-88) 162/86  Intake/Output last 24 hours:    Intake/Output Summary (Last 24 hours) at 04/29/17 1019  Last data filed at 04/29/17 0853   Gross per 24 hour    Intake             1990 ml   Output              450 ml   Net             1540 ml     Intake/Output this shift:  I/O this shift:  In: 240 [P.O.:240]  Out: -     Physical Exam:   Gen: WD F NAD   CV: S1 S2 N RRR   Chest: +Wheezing   Ab: ND NT soft no HSM +BS   Ext: 1+ edema    Access: R IJ tunneled catheter    Labs:    Recent Labs  Lab 04/29/17  0854 04/28/17  1416 04/28/17  0807 04/27/17  0620 04/26/17  0639   Glucose 207*  --   --  116* 123*   BUN 32.1*  --   --  35.5* 58.8*   Creatinine 3.8*  --   --  3.2* 4.1*   Calcium 8.6  --   --  8.1* 8.2*   Sodium 132*  --   --  135* 136   Potassium 5.2* 4.3  --  5.2* 5.5*   Chloride 98*  --   --  104 109   CO2 23  --   --  25 22   Phosphorus  --   --  3.7  --  4.2   Magnesium  --   --   --   --  2.5       Recent Labs  Lab 04/29/17  0854 04/27/17  0620 04/26/17  0639   WBC 6.59 6.04 8.78   Hgb 8.1* 7.5* 7.5*   Hematocrit 26.2* 23.9* 23.6*   MCV 71.4* 69.9* 70.4*   MCH 22.1* 21.9* 22.4*   MCHC 30.9* 31.4* 31.8*   RDW 15 16* 16*   MPV 10.6 10.5 10.2   Platelets 223 230 250

## 2017-04-29 NOTE — Plan of Care (Signed)
Problem: Renal Instability  Goal: Fluid and electrolyte balance are achieved/maintained  Outcome: Progressing   04/29/17 1304   Goal/Interventions addressed this shift   Fluid and electrolyte balance are achieved/maintained  Monitor intake and output every shift;Provide adequate hydration;Assess and reassess fluid and electrolyte status     Goal: Nutritional intake is adequate  Outcome: Progressing   04/29/17 1304   Goal/Interventions addressed this shift   Nutritional intake is adequate Allow adequate time for meals       Problem: Safety  Goal: Patient will be free from injury during hospitalization  Outcome: Progressing   04/29/17 1304   Goal/Interventions addressed this shift   Patient will be free from injury during hospitalization  Assess patient's risk for falls and implement fall prevention plan of care per policy;Hourly rounding;Provide and maintain safe environment;Assess for patients risk for elopement and implement Elopement Risk Plan per policy       Problem: Pain  Goal: Pain at adequate level as identified by patient  Outcome: Progressing   04/29/17 1304   Goal/Interventions addressed this shift   Pain at adequate level as identified by patient Identify patient comfort function goal;Reassess pain within 30-60 minutes of any procedure/intervention, per Pain Assessment, Intervention, Reassessment (AIR) Cycle;Evaluate if patient comfort function goal is met

## 2017-04-30 LAB — GFR: EGFR: 11.2

## 2017-04-30 LAB — GLUCOSE WHOLE BLOOD - POCT
Whole Blood Glucose POCT: 178 mg/dL — ABNORMAL HIGH (ref 70–100)
Whole Blood Glucose POCT: 134 mg/dL — ABNORMAL HIGH (ref 70–100)
Whole Blood Glucose POCT: 243 mg/dL — ABNORMAL HIGH (ref 70–100)
Whole Blood Glucose POCT: 319 mg/dL — ABNORMAL HIGH (ref 70–100)

## 2017-04-30 LAB — BASIC METABOLIC PANEL
Anion Gap: 6 (ref 5.0–15.0)
BUN: 42.5 mg/dL — ABNORMAL HIGH (ref 7.0–19.0)
CO2: 24 mEq/L (ref 22–29)
Calcium: 8.5 mg/dL (ref 8.5–10.5)
Chloride: 101 mEq/L (ref 100–111)
Creatinine: 4 mg/dL — ABNORMAL HIGH (ref 0.6–1.0)
Glucose: 187 mg/dL — ABNORMAL HIGH (ref 70–100)
Potassium: 4.5 mEq/L (ref 3.5–5.1)
Sodium: 131 mEq/L — ABNORMAL LOW (ref 136–145)

## 2017-04-30 LAB — IGA: Immunoglobulin A: 141 mg/dL (ref 69–517)

## 2017-04-30 LAB — IGM: Immunoglobulin M: 112 mg/dL (ref 22–293)

## 2017-04-30 LAB — IGG: Immunoglobulin G: 1077 mg/dL (ref 540–1822)

## 2017-04-30 NOTE — Progress Notes (Signed)
[facility] Hospitalist Daily Progress Note        Date Time: 04/30/2017  11:40 AM  Patient Name:Jacqueline Weber  MIW:[id number]  PCP: Jacqueline Giovanni, MD  Attending Physician:Jacqueline Weber M.D.      Chief Complaint:   Kidney failure    Subjective:     Jacqueline Weber has no new complaints.  Shortness of breath and cough continues to improve.  However, she still significantly short of breath   Denies any chest pain, denies any new complaints  Denies any abdominal pain - No Nausea, vomiting or diarrhea  No new weakness tingling or numbness.  Assessment/Plan   End-stage renal disease with uremic symptoms : Patient has a right permacath in place  Hemodialysis  initiated by nephrology.Discussed with Dr. Kate Sable, patient will have back-to-back dialysis today and tomorrow for fluid overload;Hopefully this will help with lung congestion as well  Case management has already arranged for outpatient hemodialysis  Acute bronchitisWith significant reactive airway disease: Significantly improved after initiation of steroids   Continue Solu-Medrol ; pulmonology following.  High-resolution CT results reviewed with patient's daughter  Continue nebulizers and Advair.  Continue Mucinex.  Continue azithromycin and Rocephin   Abnormal stomach wall thickening on CT scan of the abdomen:  Continue Protonix   Gastroenterology has been consulted   patient has had endoscopy and colonoscopy done in January 2018 showing gastritis, hiatal hernia, antral erosions and duodenitis and 2nd portion of duodenum with mild erythema and cecum.  Pathology showed with foveolar hyperplasia and focal intestinal metaplasia. No dysplasia  History of coronary artery disease and recent coronary artery bypass graft on June 12, 2016   Continue aspirin, beta blocker and statin  History of CVA in 2014 with mild left lower extremity weakness :  At baseline. Continue aspirin, statin  Diet-controlled diabetes  mellitus : Hemoglobin A1c in June was 6.2  Continue low-dose sliding scale and diabetic diet.  Monitor trends while on steroids  Essential hypertension: Continue current medications including Norvasc, Bumex, Coreg, clonidine and hydralazine  Anemia of chronic kidney disease : Continue ferrous sulfate, monitor hemoglobin  Dyslipidemia :Continue statin  Gastroesophageal reflux disease: Continue PPI  Disposition: Hopefully home tomorrow after hemodialysis if her pulmonary condition improves    DVT Prohylaxis: Heparin   Code Status: Full Code   Prognosis: Guarded  Type of Admission:Inpatient  Central Line/Foley Catheter/PICC line status: none  Estimated Length of Stay (including stay in the ER receiving treatment): > 2 midnights  Medical Necessity for stay: End-stage renal disease  Expected Date of Discharge:  2- 3 days   Allergies:     Allergies   Allergen Reactions   . Mosquito (Culex Pipiens) Allergy Skin Test Shortness Of Breath     And  Trouble  breathing   . Shellfish-Derived Products Hives     New  allergy  New  allergy   . Lisinopril Hives     Patient cannot remember the exact nature of the allergy   . Penicillins Rash     Swelling,  numbness       Physical Exam:   Vitals reviewed:   height is 1.524 m (5') and weight is 59.4 kg (131 lb). Her temperature is 98.7 F (37.1 C). Her blood pressure is 163/85 and her pulse is 91. Her respiration is 17 and oxygen saturation is 99%.   Body mass index is 25.58 kg/m.  Vitals:    04/30/17 1000 04/30/17 1015 04/30/17 1030 04/30/17 1045   BP: (!) 184/91 (!) 167/91  161/76 163/85   Pulse:       Resp:       Temp:       TempSrc:       SpO2:       Weight:       Height:         Intake and Output Summary (Last 24 hours) at Date Time    Intake/Output Summary (Last 24 hours) at 04/30/17 1140  Last data filed at 04/30/17 0455   Gross per 24 hour   Intake             1065 ml   Output              725 ml   Net              340 ml       Exam  Awake Alert, Oriented *3, No new F.N  deficits, Normal affect  NC.AT,PERRAL  Supple Neck,No JVD, No cervical lymphadenopathy appriciated.   Symmetrical Chest wall movement, improved air movement bilaterally, Bilateral crackles and diffuse expiratory rhonchi bilaterally  RRR,No Gallops,Rubs or new Murmurs, No Parasternal Heave  +ve B.Sounds, Abd Soft, Non tender, No organomegaly appriciated, No rebound -guarding or rigidity.  Trace bilateral lower extremity edema, No new Rash or bruise    Consult Input/Plan     Nephrology    Medications:     Current Facility-Administered Medications   Medication Dose Route Frequency Last Rate Last Dose   . acetaminophen (TYLENOL) tablet 650 mg  650 mg Oral Q4H PRN       . acetaminophen (TYLENOL) tablet 650 mg  650 mg Oral Q4H PRN   650 mg at 04/29/17 2125   . acetylcysteine (MUCOMYST) 20 % nebulizer solution 1.5 mL  1.5 mL Nebulization Q6H [place]   1.5 mL at 04/30/17 0743   . albuterol-ipratropium (DUO-NEB) 2.5-0.5(3) mg/3 mL nebulizer 3 mL  3 mL Nebulization Q6H [place]   3 mL at 04/30/17 0743   . amLODIPine (NORVASC) tablet 10 mg  10 mg Oral Daily   10 mg at 04/29/17 1004   . aspirin EC EC tablet 325 mg  325 mg Oral Daily   325 mg at 04/29/17 1003   . azithromycin (ZITHROMAX) tablet 500 mg  500 mg Oral Daily   500 mg at 04/29/17 1003   . b complex-vitamin c-folic acid (NEPHRO-VITE) tablet 1 tablet  1 tablet Oral Daily at 1000   1 tablet at 04/29/17 1007   . carvedilol (COREG) tablet 12.5 mg  12.5 mg Oral BID Meals   12.5 mg at 04/30/17 2248   . cefTRIAXone (ROCEPHIN) 1 g in sodium chloride 0.9 % 100 mL IVPB mini-bag plus  1 g Intravenous Q24H [place] 200 mL/hr at 04/29/17 1007 1 g at 04/29/17 1007   . darbepoetin alfa (ARANESP) injection 60 mcg  60 mcg Subcutaneous Weekly   60 mcg at 04/27/17 0926   . dextrose (GLUCOSE) 40 % oral gel 15 g of glucose  15 g of glucose Oral PRN        And   . dextrose 50 % bolus 12.5 g  12.5 g Intravenous PRN        And   . glucagon (rDNA) (GLUCAGEN) injection 1 mg  1 mg Intramuscular PRN       .  fluticasone-salmeterol (ADVAIR DISKUS) 100-50 MCG/DOSE 1 puff  1 puff Inhalation BID   1 puff at 04/30/17 0743   . guaiFENesin (MUCINEX) 12 hr  tablet 600 mg  600 mg Oral Q12H [place]   600 mg at 04/29/17 2125   . heparin (porcine) injection 5,000 Units  5,000 Units Subcutaneous Q8H [place]   5,000 Units at 04/30/17 0625   . hydrALAZINE (APRESOLINE) injection 10 mg  10 mg Intravenous Q6H PRN   10 mg at 04/30/17 0136   . insulin lispro (HumaLOG) injection 1-3 Units  1-3 Units Subcutaneous QHS PRN   2 Units at 04/29/17 2109   . insulin lispro (HumaLOG) injection 1-5 Units  1-5 Units Subcutaneous TID AC PRN   2 Units at 04/29/17 1643   . lactobacillus/streptococcus (RISAQUAD) capsule 1 capsule  1 capsule Oral Daily   1 capsule at 04/29/17 1004   . losartan (COZAAR) tablet 50 mg  50 mg Oral Daily   50 mg at 04/29/17 1004   . methylPREDNISolone sodium succinate (Solu-MEDROL) injection 20 mg  20 mg Intravenous TID   20 mg at 04/29/17 2126   . naloxone [facility]) injection 0.2 mg  0.2 mg Intravenous PRN       . ondansetron (ZOFRAN) injection 4 mg  4 mg Intravenous Q6H PRN   4 mg at 04/27/17 1826   . pantoprazole (PROTONIX) EC tablet 40 mg  40 mg Oral QAM AC   40 mg at 04/30/17 0820   . polyethylene glycol (MIRALAX) packet 17 g  17 g Oral Daily   17 g at 04/29/17 1006   . senna-docusate (PERICOLACE) 8.6-50 MG per tablet 2 tablet  2 tablet Oral BID   2 tablet at 04/29/17 2125   . simvastatin (ZOCOR) tablet 20 mg  20 mg Oral QHS   20 mg at 04/29/17 2125   . traMADol (ULTRAM) tablet 50 mg  50 mg Oral Q6H PRN   50 mg at 04/27/17 2114   . vitamin D (cholecalciferol) tablet 2,000 Units  2 tablet Oral Daily   2,000 Units at 04/29/17 1004          Labs:reviewed     Results     Procedure Component Value Units Date/Time    GFR [[id number]] Collected:  04/30/17 0732     Updated:  04/30/17 0810     EGFR 08.0    Basic Metabolic Panel [[id number]]  (Abnormal) Collected:  04/30/17 0732    Specimen:  Blood Updated:  04/30/17 0810     Glucose 187 (H)  mg/dL      BUN 42.5 (H) mg/dL      Creatinine 4.0 (H) mg/dL      Calcium 8.5 mg/dL      Sodium 131 (L) mEq/L      Potassium 4.5 mEq/L      Chloride 101 mEq/L      CO2 24 mEq/L      Anion Gap 6.0    Glucose Whole Blood - POCT [[id number]]  (Abnormal) Collected:  04/30/17 0752     Updated:  04/30/17 0806     POCT - Glucose Whole blood 178 (H) mg/dL     IgG, Quantitative [[id number]] Collected:  04/29/17 1826    Specimen:  Blood Updated:  04/30/17 0242     Immunoglobulin G 1,077 mg/dL     IgM, Quantitative [[id number]] Collected:  04/29/17 1826    Specimen:  Blood Updated:  04/30/17 0242     Immunoglobulin M 112 mg/dL     IgA, Quantitative [[id number]] Collected:  04/29/17 1826    Specimen:  Blood Updated:  04/30/17 0242     Immunoglobulin A 141 mg/dL  Glucose Whole Blood - POCT [[id number]]  (Abnormal) Collected:  04/29/17 2052     Updated:  04/29/17 2251     POCT - Glucose Whole blood 284 (H) mg/dL     C Reactive Protein [[id number]] Collected:  04/29/17 1826    Specimen:  Blood Updated:  04/29/17 1855     C-Reactive Protein 0.5 mg/dL     Sedimentation rate (ESR) [[id number]]  (Abnormal) Collected:  04/29/17 1826    Specimen:  Blood Updated:  04/29/17 1852     Sed Rate 81 (H) mm/Hr     Angiotensin converting enzyme [[id number]] Collected:  04/29/17 1826    Specimen:  Blood Updated:  04/29/17 1839    Narrative:       Cent ref within 1 hr    IgG Nelda [place] [[id number]] Collected:  04/29/17 1826     Updated:  04/29/17 1839    Narrative:       Fasting specimen  Fasting specimen    IgE, Quantitative [[id number]] Collected:  04/29/17 1825    Specimen:  Blood Updated:  04/29/17 1839    Glucose Whole Blood - POCT [[id number]]  (Abnormal) Collected:  04/29/17 1627     Updated:  04/29/17 1630     POCT - Glucose Whole blood 249 (H) mg/dL           Rads:reviewed   Radiological Procedure reviewed.  Radiology Results (24 Hour)     ** No results found for the last 24 hours. **          Multi-Disciplinary Care Input:   Case management  notes: No Patient Care Coordination Note on file.     PT/OT/ST notes:          Signed by: Dirk Dress  04/30/2017 11:40 AM         This note was generated by the Epic EMR system/ Dragon speech recognition and may contain inherent errors or omissions not intended by the user. Grammatical errors, random word insertions, deletions, pronoun errors and incomplete sentences are occasional consequences of this technology due to software limitations. Not all errors are caught or corrected. If there are questions or concerns about the content of this note or information contained within the body of this dictation they should be addressed directly with the author for clarification

## 2017-04-30 NOTE — Progress Notes (Signed)
04/30/17 1015   Case Management Quick Doc   CM Comments 8/25 RNCM: Admitted with ESRD. HD today and HD tomorrow. Perm cath placed in chest. New set up for [place] chair time will be MWF at 11:30 am. HD to start on Wed 11/29 at [facility]. Daughter will provide transportation to and from. Pt to receive HD today and tomorrow and hopefully [place] Tuesday afternoon. CM following.    Physical Discharge Disposition Home, No Needs  (NEW HD SET UP WITH [facility], [facility] HAD NO CHAIR TIMES. PT TO START [facility] 8/29 AT 11:30 AT [facility]. )   Discharge Disposition   Mode of Transportation Car  (DAUGHTER WILL PROVIDE TRANSPORT TO/FROM HD)   Mickel Baas [place], Rn, BSN, CM  Case Manager x [phone]

## 2017-04-30 NOTE — Progress Notes (Signed)
Pt has a bp of 190/81. Dr Glory Buff notified. No new orders. Pt has HD today. Will continue to monitor.

## 2017-04-30 NOTE — Progress Notes (Signed)
Pulmonary   Note    Date Time: 04/30/17 7:36 PM  Patient Name: Jacqueline Weber  Attending Physician: Dirk Dress, MD      Subjective:   Patient Seen and Examined. The notes, labs and  X-rays  were reviewed.     ++cough -- pt feels much better  Cough improved  Sob improved  HRCT chest reviewed    ESR 81  CRP normal  Immunoglobulin pending    HRCT chest reviewed :  Mild bronchiectasis / small effusion Lt side and thick stomach as described    Assessment:     ESRD / ARF  Chronic / persistent cough x 5-6 month  Acute bronchitis   R/O Bronchiectasis /? Chronic Lt base infiltrate   HTN  CAD   H/o CVA   Anemia   GERD         Plan:n:     Air entry better  Still very coarse-- continue in pt care next 24 hrs       HRCT chest  - reviewed   ESR 81  CRP  <1  Immunoglobulin levels  ACE  Pending       Continue :   Mucomyst  Flutter valve   Flonase    Persistent cough   If no better and remains symptomatic -- would consider bronch   Consider CT abdomen with oral contrast -- will be unable to give IV contrast   Vs EGD  For direct evaluation ?       Continue Duoneb  IV Solumedrol  Advair disc   Protonix     IV Rocephin + Zithromax    DVT prophylaxis ; On SQ Heparin           Review of Systems:   General ROS: negative, no weight loss/gain, no fever, no chills, no rigor   ENT ROS: negative   Endocrine ROS: negative, no fatigue, no polydipsia   Respiratory ROS: ++chronic  cough, shortness of breath, or wheezing   Cardiovascular ROS: no chest pain or+ dyspnea on exertion   Gastrointestinal ROS: no abdominal pain, change in bowel habits, or black or bloody stools   Genito-Urinary ROS: no dysuria, trouble voiding, or hematuria   Musculoskeletal ROS: negative   Neurological ROS: no encephalopathy   Dermatological ROS: negative, no rash, no ulcer  Psych:    Cooperative    Past Medical History:   Diagnosis Date   . Anemia 10/02/2016    H/O ANEMIA - REFLECTED ON CURRENT LABS.   [name] Cataracts, bilateral     H/O CATARACTS - SURGERY DONE  - STILL HAS SOME ISSUES.   . Cerebrovascular accident     H/O STROKE - WEAKNESS ON LEFT SIDE. NO NEUROLOGIST.    [name] Constipation    . Coronary artery disease 06/2016    H/O QUADRUPLE BYPASS SURGERY - FOLLOWED BY [facility].   [name] History of MI (myocardial infarction)    . 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 Hypercholesteremia     CONTROLLED WITH MEDS.   [name] Hypertension     CONTROLLED WITH MEDS.   . Pre-diabetes    . Renal insufficiency     CHRONIC KIDNEY DISEASE - FOLLOWED BY DR. Kate Sable.      History   Alcohol Use No      History   Smoking Status   . Never Smoker   Smokeless Tobacco   . Never Used      History  Drug Use No        Physical Exam:   BP 172/80   Pulse 92   Temp 98.2 F (36.8 C) (Oral)   Resp 18   Ht 1.524 m (5')   Wt 59.4 kg (131 lb)   SpO2 97%   BMI 25.58 kg/m   General appearance - alert, well appearing, and in no distress moderate cough through out   Mental status - alert, oriented to person, place, and time  Eyes - pupils equal and reactive, extraocular eye movements intact, sclera anicteric  Ears - generally normal looking, no errythema  Nose - normal and patent, no erythema, discharge or polyps  Mouth - mucous membranes moist, pharynx normal without lesions and tongue normal  Neck - supple, no significant adenopathy and carotids upstroke normal bilaterally, no bruits  Lymphatics - no palpable lymphadenopathy  Chest - auscultation, no wheezes,  Bilateral coarse  Sounds / / scattered rales or few rhonchi, symmetric air entry  Heart - normal rate and regular rhythm, S1 and S2 normal, P2 not loud , No RV heave  Abdomen - soft, nontender, nondistended, no masses or organomegaly  bowel sounds normal  Neurological - alert, oriented, normal speech, no focal findings or movement disorder noted, neck supple without rigidity, Detail exam not done  Extremities - peripheral pulses normal, no pedal edema, no clubbing or cyanosis,    Skin - normal coloration and turgor, no rashes, no suspicious skin lesions  noted     ALL:  Mosquito (Culex Pipiens) Allergy Skin Test  Shellfish-Derived Products  Lisinopril  Penicillins         Meds:      Scheduled Meds: PRN Meds:        albuterol-ipratropium 3 mL Nebulization Q6H [place]   amLODIPine 10 mg Oral Daily   aspirin EC 325 mg Oral Daily   b complex-vitamin c-folic acid 1 tablet Oral Daily at 1000   carvedilol 12.5 mg Oral BID Meals   cefTRIAXone 1 g Intravenous Q24H [facility]   darbepoetin alfa 60 mcg Subcutaneous Weekly   fluticasone-salmeterol 1 puff Inhalation BID   guaiFENesin 600 mg Oral Q12H [place]   heparin (porcine) 5,000 Units Subcutaneous Q8H [facility]   lactobacillus/streptococcus 1 capsule Oral Daily   losartan 50 mg Oral Daily   methylPREDNISolone 20 mg Intravenous TID   pantoprazole 40 mg Oral QAM AC   polyethylene glycol 17 g Oral Daily   senna-docusate 2 tablet Oral BID   simvastatin 20 mg Oral QHS   vitamin D 2 tablet Oral Daily         Continuous Infusions:     acetaminophen 650 mg Q4H PRN   acetaminophen 650 mg Q4H PRN   dextrose 15 g of glucose PRN   And     dextrose 12.5 g PRN   And     glucagon (rDNA) 1 mg PRN   hydrALAZINE 10 mg Q6H PRN   insulin lispro 1-3 Units QHS PRN   insulin lispro 1-5 Units TID AC PRN   naloxone 0.2 mg PRN   ondansetron 4 mg Q6H PRN   traMADol 50 mg Q6H PRN         I personally reviewed all of the medications      Labs:     Recent Labs  Lab 04/30/17  0732  04/28/17  0807  04/26/17  0639   Glucose 187* More results in Results Review  --  More results in Results Review 123*   BUN 42.5*  More results in Results Review  --  More results in Results Review 58.8*   Creatinine 4.0* More results in Results Review  --  More results in Results Review 4.1*   Calcium 8.5 More results in Results Review  --  More results in Results Review 8.2*   Sodium 131* More results in Results Review  --  More results in Results Review 136   Potassium 4.5 More results in Results Review  --  More results in Results Review 5.5*   Chloride 101 More results in Results Review  --   More results in Results Review 109   CO2 24 More results in Results Review  --  More results in Results Review 22   Phosphorus  --   --  3.7  --  4.2   Magnesium  --   --   --   --  2.5   EGFR 11.2 More results in Results Review  --  More results in Results Review 10.9   More results in Results Review = values in this interval not displayed.            Recent Labs  Lab 04/29/17  0854   WBC 6.59   Hgb 8.1*   Hematocrit 26.2*   MCV 71.4*   MCH 22.1*   MCHC 30.9*   RDW 15   MPV 10.6   Platelets 223             Invalid input(s): FREET4          Radiology Results (24 Hour)     ** No results found for the last 24 hours. **             Case discussed with:  Team         Danae Chen, MD     08/27/187:36 PM

## 2017-04-30 NOTE — Plan of Care (Signed)
Problem: Renal Instability  Goal: Fluid and electrolyte balance are achieved/maintained  Outcome: Progressing   04/29/17 1304   Goal/Interventions addressed this shift   Fluid and electrolyte balance are achieved/maintained  Monitor intake and output every shift;Provide adequate hydration;Assess and reassess fluid and electrolyte status     Goal: Nutritional intake is adequate  Outcome: Progressing   04/29/17 1304   Goal/Interventions addressed this shift   Nutritional intake is adequate Allow adequate time for meals       Problem: Patient Receiving Advanced Renal Therapies  Goal: Therapy access site remains intact  Outcome: Progressing   04/28/17 0741   Goal/Interventions addressed this shift   Therapy access site remains intact Assess therapy access site       Problem: Safety  Goal: Patient will be free from injury during hospitalization  Outcome: Progressing   04/29/17 1304   Goal/Interventions addressed this shift   Patient will be free from injury during hospitalization  Assess patient's risk for falls and implement fall prevention plan of care per policy;Hourly rounding;Provide and maintain safe environment;Assess for patients risk for elopement and implement Elopement Risk Plan per policy     Goal: Patient will be free from infection during hospitalization  Outcome: Progressing   04/26/17 1326   Goal/Interventions addressed this shift   Free from Infection during hospitalization Assess and monitor for signs and symptoms of infection;Monitor lab/diagnostic results       Problem: Pain  Goal: Pain at adequate level as identified by patient  Outcome: Progressing   04/29/17 1304   Goal/Interventions addressed this shift   Pain at adequate level as identified by patient Identify patient comfort function goal;Reassess pain within 30-60 minutes of any procedure/intervention, per Pain Assessment, Intervention, Reassessment (AIR) Cycle;Evaluate if patient comfort function goal is met       Problem: Discharge  Barriers  Goal: Patient will be discharged home or other facility with appropriate resources  Outcome: Progressing   04/26/17 1326   Goal/Interventions addressed this shift   Discharge to home or other facility with appropriate resources Provide appropriate patient education;Provide information on available health resources;Initiate discharge planning       Problem: Psychosocial and Spiritual Needs  Goal: Demonstrates ability to cope with hospitalization/illness  Outcome: Progressing   04/27/17 2149   Goal/Interventions addressed this shift   Demonstrates ability to cope with hospitalizations/illness Provide quiet environment;Encourage verbalization of feelings/concerns/expectations;Encourage participation in diversional activity;Assist patient to identify own strengths and abilities;Encourage patient to set small goals for self;Include patient/ patient care companion in decisions       Problem: Moderate/High Fall Risk Score >5  Goal: Patient will remain free of falls  Outcome: Progressing   04/28/17 2245   OTHER   Moderate Risk (6-13) MOD-Place Fall Risk level on whiteboard in room;MOD-Initiate Yellow "Fall Risk" magnet communication tool       Comments: Pt alert and oriented and able to make needs known. Complaining of HA and was medicated with Tylenol. Reported relief. Breathing easy on RA. Blood pressure elevated. Hydralazine given as ordered. Please see flowsheet for BP. Up with one assist. All belongings within r each. Pt NPO for possible procedure in am. NAD. Permacath to right  Chest CDI. Will continue to monitor with hourly rounding and treat as needed.

## 2017-04-30 NOTE — Progress Notes (Addendum)
HD CARE COORDINATOR NOTE - [facility] is able to offer the following OP HD schedule to pt - MWF 6:45am or MWF 11:30am. Waiting on call back from pt's daughter to finalize preference.     Addendum: rcvd call back from pt's daughter, she prefers the MWF 11:30am with start date planned for [facility] 05/02/17.  Instructions have been added to [place], HD Care Coordinator  [phone]

## 2017-04-30 NOTE — Procedures (Signed)
Pre Treatment-The patient was brought to the HD room via her bed per the HD nurse.  The patient is awake/alert and denied any discomfort.  HD nurse talked to the patient's daughter via phone.  She is scheduled to be discharged on 8.28.18-Tuesday.  The patient will be run on a 2 K+ bathe per protocol with a blood K+ level of 4.5.      Treatment-Good A/V flow at 350 ml per minute.  However, the bloodlines were reversed due to arterial alarms.  The patient tolerated treatment.    Post Treatment-Removed 4 L of fluid.  New Tegos and Curos Caps applied.  The patient awake/alert and denied no discomfort.     Report given to [place]  Kendra Opitz RN

## 2017-04-30 NOTE — Plan of Care (Signed)
Problem: Renal Instability  Goal: Fluid and electrolyte balance are achieved/maintained  Outcome: Progressing   04/30/17 1413   Goal/Interventions addressed this shift   Fluid and electrolyte balance are achieved/maintained  Monitor intake and output every shift;Provide adequate hydration;Assess for confusion/personality changes;Monitor/assess lab values and report abnormal values;Monitor daily weight     Goal: Nutritional intake is adequate  Outcome: Progressing   04/30/17 1413   Goal/Interventions addressed this shift   Nutritional intake is adequate Monitor daily weights;Assist patient with meals/food selection;Encourage/perform oral hygiene as appropriate;Allow adequate time for meals     Goal: Perineal skin integrity is maintained or improved  Outcome: Progressing   04/30/17 1413   Goal/Interventions addressed this shift   Perineal skin integrity is maintained or improved Keep intact skin clean and dry;Use protective skin barriers to decrease potential skin breakdown;Apply urinary containment device as appropriate and/or per order     Goal: Free from infection  Outcome: Progressing   04/30/17 1413   Goal/Interventions addressed this shift   Free from infection Monitor/assess for signs and symptoms of infection;Assess need for indwelling catheter every shift and discuss with LIP       Problem: Patient Receiving Advanced Renal Therapies  Goal: Therapy access site remains intact  Outcome: Not Progressing   04/30/17 1413   Goal/Interventions addressed this shift   Therapy access site remains intact Assess therapy access site;Change therapy access site dressing as needed       Problem: Safety  Goal: Patient will be free from injury during hospitalization  Outcome: Progressing   04/29/17 1304   Goal/Interventions addressed this shift   Patient will be free from injury during hospitalization  Assess patient's risk for falls and implement fall prevention plan of care per policy;Hourly rounding;Provide and maintain safe  environment;Assess for patients risk for elopement and implement Elopement Risk Plan per policy     Goal: Patient will be free from infection during hospitalization  Outcome: Progressing   04/30/17 1413   Goal/Interventions addressed this shift   Free from Infection during hospitalization Assess and monitor for signs and symptoms of infection;Monitor lab/diagnostic results;Monitor all insertion sites (i.e. indwelling lines, tubes, urinary catheters, and drains)       Problem: Pain  Goal: Pain at adequate level as identified by patient  Outcome: Progressing   04/30/17 1413   Goal/Interventions addressed this shift   Pain at adequate level as identified by patient Identify patient comfort function goal;Assess for risk of opioid induced respiratory depression, including snoring/sleep apnea. Alert healthcare team of risk factors identified.;Assess pain on admission, during daily assessment and/or before any "as needed" intervention(s)       Problem: Side Effects from Pain Analgesia  Goal: Patient will experience minimal side effects of analgesic therapy  Outcome: Progressing   04/30/17 1413   Goal/Interventions addressed this shift   Patient will experience minimal side effects of analgesic therapy Monitor/assess patient's respiratory status (RR depth, effort, breath sounds);Assess for changes in cognitive function       Problem: Discharge Barriers  Goal: Patient will be discharged home or other facility with appropriate resources  Outcome: Progressing   04/30/17 1413   Goal/Interventions addressed this shift   Discharge to home or other facility with appropriate resources Provide appropriate patient education;Initiate discharge planning;Provide information on available health resources       Problem: Psychosocial and Spiritual Needs  Goal: Demonstrates ability to cope with hospitalization/illness  Outcome: Progressing   04/30/17 1413   Goal/Interventions addressed this shift  Demonstrates ability to cope with  hospitalizations/illness Assist patient to identify own strengths and abilities;Encourage patient to set small goals for self;Encourage verbalization of feelings/concerns/expectations;Provide quiet environment       Problem: Moderate/High Fall Risk Score >5  Goal: Patient will remain free of falls  Outcome: Progressing   04/30/17 1413   OTHER   Moderate Risk (6-13) MOD-Initiate Yellow "Fall Risk" magnet communication tool;MOD-Consider activation of bed alarm if appropriate;MOD-Apply bed exit alarm if patient is confused;MOD-Floor mat at bedside (where available) if appropriate;MOD-Use of chair-pad alarm when appropriate;MOD-Consider a move closer to Nurses Station       Comments: Patient A+OX3, VSS. BP elevated at beginning of shift. Coreg scheduled dose given. Patient off unit by 9 am for dialysis. Tele SR. Accuchecks ACHS with no coverage given. Patient NPO since midnight. Patient tolerated lunch around 2 pm after arriving from dialysis. Ambulates with contact guard to bathroom. B MX 2 , refused miralax and senokot. + cough. Will continue to monitor for changes.

## 2017-04-30 NOTE — Progress Notes (Signed)
[facility].  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 acetylcysteine  1.5 mL Nebulization Q6H [place]   . albuterol-ipratropium  3 mL Nebulization Q6H [facility]   . amLODIPine  10 mg Oral Daily   . aspirin EC  325 mg Oral Daily   . azithromycin  500 mg Oral Daily   . b complex-vitamin c-folic acid  1 tablet Oral Daily at 1000   . carvedilol  12.5 mg Oral BID Meals   . cefTRIAXone  1 g Intravenous Q24H SCH   . darbepoetin alfa  60 mcg Subcutaneous Weekly   . fluticasone-salmeterol  1 puff Inhalation BID   . guaiFENesin  600 mg Oral Q12H [place]   . heparin (porcine)  5,000 Units Subcutaneous Q8H [facility]   . lactobacillus/streptococcus  1 capsule Oral Daily   . losartan  50 mg Oral Daily   . methylPREDNISolone  20 mg Intravenous TID   . pantoprazole  40 mg Oral QAM AC   . polyethylene glycol  17 g Oral Daily   . senna-docusate  2 tablet Oral BID   . simvastatin  20 mg Oral QHS   . vitamin D  2 tablet Oral Daily     Continuous Infusions:  PRN Meds:acetaminophen, acetaminophen, Nursing communication: Adult Hypoglycemia Treatment Algorithm **AND** dextrose **AND** dextrose **AND** glucagon (rDNA), hydrALAZINE, insulin lispro, insulin lispro, naloxone, ondansetron, traMADol    Objective:  Vital signs in last 24 hours:  Temp:  [97.2 F (36.2 C)-98.7 F (37.1 C)] 98.7 F (37.1 C)  Heart Rate:  [78-94] 91  Resp Rate:  [17-21] 17  BP: (161-207)/(62-98) 163/85    Intake/Output from yesterday (07:01 - 07:00):  08/26 0701 - 08/27 0700  In: 0177  [P.O.:1305; I.V.:100]  Out: 725 [Urine:725]     Physical Exam:   Gen: WD WN NAD   CV: S1 S2 N RRR   Chest: CTAB   Ab: ND NT soft no HSM +BS   Ext: No C/E    Labs:    Recent Labs  Lab 04/30/17  0732 04/29/17  0854 04/28/17  1416 04/28/17  0807 04/27/17  0620 04/26/17  0639   Glucose 187* 207*  --   --  116* 123*   BUN 42.5* 32.1*  --   --  35.5* 58.8*   Creatinine 4.0* 3.8*  --   --  3.2* 4.1*   Calcium 8.5 8.6  --   --  8.1* 8.2*   Sodium 131* 132*  --   --  135* 136   Potassium 4.5 5.2* 4.3  --  5.2* 5.5*   Chloride 101 98*  --   --  104 109   CO2 24 23  --   --  25 22   Phosphorus  --   --   --  3.7  --  4.2   Magnesium  --   --   --   --   --  2.5       Recent Labs  Lab 04/29/17  0854 04/27/17  0620 04/26/17  0639   WBC 6.59 6.04 8.78   Hgb 8.1* 7.5* 7.5*   Hematocrit 26.2* 23.9* 23.6*   MCV 71.4* 69.9* 70.4*   MCH 22.1* 21.9* 22.4*   MCHC 30.9* 31.4* 31.8*   RDW 15 16* 16*   MPV 10.6 10.5 10.2   Platelets 223 230 250

## 2017-05-01 DIAGNOSIS — E871 Hypo-osmolality and hyponatremia: Secondary | ICD-10-CM

## 2017-05-01 DIAGNOSIS — D509 Iron deficiency anemia, unspecified: Secondary | ICD-10-CM

## 2017-05-01 DIAGNOSIS — I150 Renovascular hypertension: Secondary | ICD-10-CM

## 2017-05-01 DIAGNOSIS — D631 Anemia in chronic kidney disease: Secondary | ICD-10-CM

## 2017-05-01 DIAGNOSIS — R7989 Other specified abnormal findings of blood chemistry: Secondary | ICD-10-CM

## 2017-05-01 LAB — BASIC METABOLIC PANEL
Anion Gap: 7 (ref 5.0–15.0)
BUN: 32.6 mg/dL — ABNORMAL HIGH (ref 7.0–19.0)
CO2: 25 mEq/L (ref 22–29)
Calcium: 8.3 mg/dL — ABNORMAL LOW (ref 8.5–10.5)
Chloride: 101 mEq/L (ref 100–111)
Creatinine: 3.2 mg/dL — ABNORMAL HIGH (ref 0.6–1.0)
Glucose: 166 mg/dL — ABNORMAL HIGH (ref 70–100)
Potassium: 4.5 mEq/L (ref 3.5–5.1)
Sodium: 133 mEq/L — ABNORMAL LOW (ref 136–145)

## 2017-05-01 LAB — GLUCOSE WHOLE BLOOD - POCT
Whole Blood Glucose POCT: 153 mg/dL — ABNORMAL HIGH (ref 70–100)
Whole Blood Glucose POCT: 135 mg/dL — ABNORMAL HIGH (ref 70–100)
Whole Blood Glucose POCT: 201 mg/dL — ABNORMAL HIGH (ref 70–100)

## 2017-05-01 LAB — CBC
Absolute NRBC: 0.05 10*3/uL — ABNORMAL HIGH
Hematocrit: 26.7 % — ABNORMAL LOW (ref 37.0–47.0)
Hgb: 8.3 g/dL — ABNORMAL LOW (ref 12.0–16.0)
MCH: 21.6 pg — ABNORMAL LOW (ref 28.0–32.0)
MCHC: 31.1 g/dL — ABNORMAL LOW (ref 32.0–36.0)
MCV: 69.5 fL — ABNORMAL LOW (ref 80.0–100.0)
MPV: 10.7 fL (ref 9.4–12.3)
Nucleated RBC: 0.4 /100 WBC (ref 0.0–1.0)
Platelets: 295 10*3/uL (ref 140–400)
RBC: 3.84 10*6/uL — ABNORMAL LOW (ref 4.20–5.40)
RDW: 15 % (ref 12–15)
WBC: 13.61 10*3/uL — ABNORMAL HIGH (ref 3.50–10.80)

## 2017-05-01 LAB — GFR: EGFR: 14.6

## 2017-05-01 MED ORDER — FUROSEMIDE 10 MG/ML IJ SOLN
20.0000 mg | Freq: Once | INTRAMUSCULAR | Status: AC
Start: 2017-05-01 — End: 2017-05-01
  Administered 2017-05-01: 08:00:00 20 mg via INTRAVENOUS
  Filled 2017-05-01: qty 2

## 2017-05-01 MED ORDER — CEFUROXIME AXETIL 500 MG PO TABS
250.0000 mg | ORAL_TABLET | Freq: Two times a day (BID) | ORAL | 0 refills | Status: DC
Start: 2017-05-01 — End: 2017-05-15

## 2017-05-01 MED ORDER — RISAQUAD PO CAPS
1.0000 | ORAL_CAPSULE | Freq: Every day | ORAL | Status: DC
Start: 2017-05-02 — End: 2017-06-20

## 2017-05-01 MED ORDER — NEPHRO-VITE 0.8 MG PO TABS
1.0000 | ORAL_TABLET | Freq: Every day | ORAL | Status: DC
Start: 2017-05-02 — End: 2017-06-15

## 2017-05-01 MED ORDER — PREDNISONE 10 MG PO TABS
10.0000 mg | ORAL_TABLET | Freq: Two times a day (BID) | ORAL | 0 refills | Status: DC
Start: 2017-05-01 — End: 2017-05-15

## 2017-05-01 MED ORDER — SODIUM CHLORIDE 0.9 % IV BOLUS
100.0000 mL | INTRAVENOUS | Status: AC | PRN
Start: 2017-05-01 — End: 2017-05-01

## 2017-05-01 MED ORDER — FLUTICASONE-SALMETEROL 100-50 MCG/DOSE IN AEPB
1.0000 | INHALATION_SPRAY | Freq: Two times a day (BID) | RESPIRATORY_TRACT | 0 refills | Status: DC
Start: 2017-05-01 — End: 2018-04-23

## 2017-05-01 MED ORDER — HEPARIN SODIUM (PORCINE) 1000 UNIT/ML IJ SOLN
1000.0000 [IU] | INTRAMUSCULAR | Status: AC | PRN
Start: 2017-05-01 — End: 2017-05-01
  Filled 2017-05-01: qty 1

## 2017-05-01 MED ORDER — ALBUMIN HUMAN 25 % IV SOLN
100.0000 mL | INTRAVENOUS | Status: AC | PRN
Start: 2017-05-01 — End: 2017-05-01
  Filled 2017-05-01: qty 100

## 2017-05-01 MED ORDER — SODIUM CHLORIDE 0.9 % IV BOLUS
250.0000 mL | INTRAVENOUS | Status: AC | PRN
Start: 2017-05-01 — End: 2017-05-01

## 2017-05-01 NOTE — Plan of Care (Signed)
Problem: Renal Instability  Goal: Fluid and electrolyte balance are achieved/maintained  Outcome: Progressing   05/01/17 0238   Goal/Interventions addressed this shift   Fluid and electrolyte balance are achieved/maintained  Monitor intake and output every shift;Monitor/assess lab values and report abnormal values;Assess for confusion/personality changes;Provide adequate hydration       Problem: Safety  Goal: Patient will be free from injury during hospitalization  Outcome: Progressing   05/01/17 0238   Goal/Interventions addressed this shift   Patient will be free from injury during hospitalization  Assess patient's risk for falls and implement fall prevention plan of care per policy;Provide and maintain safe environment;Ensure appropriate safety devices are available at the bedside       Problem: Pain  Goal: Pain at adequate level as identified by patient  Outcome: Progressing   05/01/17 0238   Goal/Interventions addressed this shift   Pain at adequate level as identified by patient Identify patient comfort function goal;Assess pain on admission, during daily assessment and/or before any "as needed" intervention(s);Evaluate if patient comfort function goal is met

## 2017-05-01 NOTE — Progress Notes (Signed)
CM rounded with MD, possible discharge tomorrow. Outpatient HD has been arranged. HD liaison updated with expected d/c date, patient expected to start outpatient HD Friday.       Blima Dessert  Discharge Planner  [phone]

## 2017-05-01 NOTE — Progress Notes (Signed)
Pulmonary   Note    Date Time: 05/01/17 2:17 PM  Patient Name: Jacqueline Weber  Attending Physician: Timoteo Gaul, MD      Subjective:   Patient Seen and Examined. The notes, labs and  X-rays  were reviewed.     cough -- improved   pt feels much better  Sob improved  HRCT chest reviewed    ESR 81  CRP normal  Immunoglobulin pending    HRCT chest reviewed :  Mild bronchiectasis / small effusion Lt side and thick stomach as described    Assessment:     ESRD / ARF  Chronic / persistent cough x 5-6 month  Acute bronchitis   R/O Bronchiectasis /? Chronic Lt base infiltrate   HTN  CAD   H/o CVA   Anemia   GERD         Plan:n:     Air entry better  Still very coarse-- continue in pt care next 24 hrs       HRCT chest  - reviewed   ESR 81  CRP  <1  Immunoglobulin levels  ACE  Pending -- f/u as OP     OK to d/c   OP f/u x 1-2 weeks            Persistent cough   If no better and remains symptomatic -- would consider bronch as OP   Consider CT abdomen with oral contrast -- will be unable to give IV contrast   Vs EGD  For direct evaluation ?       Continue Duoneb  IV Solumedrol  Advair disc   Protonix     IV Rocephin + Zithromax    DVT prophylaxis ; On SQ Heparin           Review of Systems:   General ROS: negative, no weight loss/gain, no fever, no chills, no rigor   ENT ROS: negative   Endocrine ROS: negative, no fatigue, no polydipsia   Respiratory ROS: ++chronic  cough, shortness of breath, or wheezing   Cardiovascular ROS: no chest pain or+ dyspnea on exertion   Gastrointestinal ROS: no abdominal pain, change in bowel habits, or black or bloody stools   Genito-Urinary ROS: no dysuria, trouble voiding, or hematuria   Musculoskeletal ROS: negative   Neurological ROS: no encephalopathy   Dermatological ROS: negative, no rash, no ulcer  Psych:    Cooperative    Past Medical History:   Diagnosis Date   . Anemia 10/02/2016    H/O ANEMIA - REFLECTED ON CURRENT LABS.   [name] Cataracts, bilateral     H/O CATARACTS - SURGERY  DONE - STILL HAS SOME ISSUES.   . Cerebrovascular accident     H/O STROKE - WEAKNESS ON LEFT SIDE. NO NEUROLOGIST.    [name] Constipation    . Coronary artery disease 06/2016    H/O QUADRUPLE BYPASS SURGERY - FOLLOWED BY [facility].   [name] History of MI (myocardial infarction)    . 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 Hypercholesteremia     CONTROLLED WITH MEDS.   [name] Hypertension     CONTROLLED WITH MEDS.   . Pre-diabetes    . Renal insufficiency     CHRONIC KIDNEY DISEASE - FOLLOWED BY DR. Kate Sable.      History   Alcohol Use No      History   Smoking Status   . Never Smoker   Smokeless Tobacco   .  Never Used      History   Drug Use No        Physical Exam:   BP 170/87   Pulse (!) 102   Temp 98.1 F (36.7 C) (Oral)   Resp 16   Ht 1.524 m (5')   Wt 59.4 kg (131 lb)   SpO2 97%   BMI 25.58 kg/m   General appearance - alert, well appearing, and in no distress moderate cough through out   Mental status - alert, oriented to person, place, and time  Eyes - pupils equal and reactive, extraocular eye movements intact, sclera anicteric  Ears - generally normal looking, no errythema  Nose - normal and patent, no erythema, discharge or polyps  Mouth - mucous membranes moist, pharynx normal without lesions and tongue normal  Neck - supple, no significant adenopathy and carotids upstroke normal bilaterally, no bruits  Lymphatics - no palpable lymphadenopathy  Chest - auscultation, no wheezes,  Bilateral coarse  Sounds / / scattered rales or few rhonchi, symmetric air entry  Heart - normal rate and regular rhythm, S1 and S2 normal, P2 not loud , No RV heave  Abdomen - soft, nontender, nondistended, no masses or organomegaly  bowel sounds normal  Neurological - alert, oriented, normal speech, no focal findings or movement disorder noted, neck supple without rigidity, Detail exam not done  Extremities - peripheral pulses normal, no pedal edema, no clubbing or cyanosis,    Skin - normal coloration and turgor, no rashes, no suspicious  skin lesions noted     ALL:  Mosquito (Culex Pipiens) Allergy Skin Test  Shellfish-Derived Products  Lisinopril  Penicillins         Meds:      Scheduled Meds: PRN Meds:        albuterol-ipratropium 3 mL Nebulization Q6H [place]   amLODIPine 10 mg Oral Daily   aspirin EC 325 mg Oral Daily   b complex-vitamin c-folic acid 1 tablet Oral Daily at 1000   carvedilol 12.5 mg Oral BID Meals   cefTRIAXone 1 g Intravenous Q24H [facility]   darbepoetin alfa 60 mcg Subcutaneous Weekly   fluticasone-salmeterol 1 puff Inhalation BID   guaiFENesin 600 mg Oral Q12H [place]   heparin (porcine) 5,000 Units Subcutaneous Q8H [facility]   lactobacillus/streptococcus 1 capsule Oral Daily   losartan 50 mg Oral Daily   methylPREDNISolone 20 mg Intravenous TID   pantoprazole 40 mg Oral QAM AC   polyethylene glycol 17 g Oral Daily   senna-docusate 2 tablet Oral BID   simvastatin 20 mg Oral QHS   vitamin D 2 tablet Oral Daily         Continuous Infusions:     acetaminophen 650 mg Q4H PRN   acetaminophen 650 mg Q4H PRN   dextrose 15 g of glucose PRN   And     dextrose 12.5 g PRN   And     glucagon (rDNA) 1 mg PRN   hydrALAZINE 10 mg Q6H PRN   insulin lispro 1-3 Units QHS PRN   insulin lispro 1-5 Units TID AC PRN   naloxone 0.2 mg PRN   ondansetron 4 mg Q6H PRN   traMADol 50 mg Q6H PRN         I personally reviewed all of the medications      Labs:     Recent Labs  Lab 05/01/17  0525  04/28/17  0807  04/26/17  0639   Glucose 166* More results in Results Review  --  More results in Results Review 123*   BUN 32.6* More results in Results Review  --  More results in Results Review 58.8*   Creatinine 3.2* More results in Results Review  --  More results in Results Review 4.1*   Calcium 8.3* More results in Results Review  --  More results in Results Review 8.2*   Sodium 133* More results in Results Review  --  More results in Results Review 136   Potassium 4.5 More results in Results Review  --  More results in Results Review 5.5*   Chloride 101 More results in Results  Review  --  More results in Results Review 109   CO2 25 More results in Results Review  --  More results in Results Review 22   Phosphorus  --   --  3.7  --  4.2   Magnesium  --   --   --   --  2.5   EGFR 14.6 More results in Results Review  --  More results in Results Review 10.9   More results in Results Review = values in this interval not displayed.            Recent Labs  Lab 05/01/17  0524   WBC 13.61*   Hgb 8.3*   Hematocrit 26.7*   MCV 69.5*   MCH 21.6*   MCHC 31.1*   RDW 15   MPV 10.7   Platelets 295             Invalid input(s): FREET4          Radiology Results (24 Hour)     ** No results found for the last 24 hours. **             Case discussed with:  Team         Danae Chen, MD     08/28/182:17 PM

## 2017-05-01 NOTE — Plan of Care (Signed)
Denies pain. Educated patient and on BP control. Daughter states, "We have a BP cuff at home and we use it." Daughter was able to teach back purpose of each medication.

## 2017-05-01 NOTE — Progress Notes (Signed)
Patient to return home today. Outpatient HD has been arranged with [facility], confirmed with [facility] by phone. Daughter to transport patient. No further needs identified at this time.          05/01/17 1618   Discharge Disposition   Patient preference/choice provided? Yes   Physical Discharge Disposition Home with Needs   Mode of Transportation Car   Patient/Family/POA notified of transfer plan Yes   Patient agreeable to discharge plan/expected d/c date? Yes   Family/POA agreeable to discharge plan/expected d/c date? Yes   Bedside nurse notified of transport plan? Yes   Outpatient Services   Services Outpatient dialysis   CM Interventions   Follow up appointment scheduled? No   Reason no follow up scheduled? Patient declined;Family to schedule   Referral made for home health RN visit? Patient declined   Multidisciplinary rounds/family meeting before d/c? Yes   Medicare Checklist   Is this a Medicare patient? Yes   Patient received 1st IMM Letter? Yes

## 2017-05-01 NOTE — Progress Notes (Addendum)
HD CARE COORDINATOR NOTE - update from hosp CC, Blima Dessert - d/c delayed until Wednesday. Start date at [facility] pushed to Friday 05/04/17 - clinic has been notified     ADDENDUM - d/c update from [place], [place] has cleared pt for d/c today.  [facility] Raelyn Mora has been updated - pt is OK to start at [place] clinic tomorrow morning 11:15am.    Freida Busman, HD Care Coordinator  [phone]

## 2017-05-01 NOTE — Discharge Summary (Signed)
[facility] HOSP[facility]   [place] Summary   Patient Info:   Date/Time: 05/01/2017 / 6:06 PM   Admit Date:04/25/2017  Patient Name:Jacqueline Weber   XHF:[id number]   PCP: John Giovanni, MD  Attending Physician:Sharlon Pfohl, Lorin Glass, MD      Discharge date and time: No discharge date for patient encounter.     DIAGNOSIS :   Patient Active Problem List   Diagnosis   . HTN (hypertension) urgency   . Hyperlipidemia   . History of CVA (cerebrovascular accident)   . Elevated brain natriuretic peptide (BNP) level   . Hyponatremia   . NSTEMI (non-ST elevated myocardial infarction)   . Pre-diabetes   . End stage renal disease   . Acute bronchitis   . Anemia in chronic kidney disease (CODE)   . Iron deficiency anemia, unspecified iron deficiency anemia type          PROCEDURES: Dialysis  8 /24,27,28 /2018     Consults: Nephrology: ASSEFI MD, ALI ;Pulmonology: Danae Chen, MD     Hospital Course:   Medical Necessity for stay:66 year old female with history of chronic kidney disease, referred  as a direct admit to medical floor for hemodialysis after permanent catheter placement in  right upper chest earlier in the day at [facility].  Most of the history was taken from patient's daughter.  Patient speaks [place] language with some English .  She complained of recent  Lethargy,with nausea, vomiting, headache, poor urine output.,generalized weakness  She denies fever, chills, abdominal pain, diarrhea or constipation.According to pt and her daughter the patient has had a cough and URI symptoms for about 6 months , which has been constant . Cough is day and night / yellow moderate sputum     Hospital Course:  Patient was started on dialysis.  She tolerated this well.  She continued to have cough and drainage.  Corticosteroids, and inhalers and nebulizers and antibiotics were started.  Patient was seen by Dr. Yancey Flemings in pulmonary.  He felt that most of his congestion was due to kidney disease.  He was concerned about the  yellow mucous and possibility of chronic bronchitis.  Patient was urged to stay and have further dialysis.  The breathing was improved but he still, she still had significant rhonchi upon discharge.  Denied any fevers.  She tolerated the Advair inhaler, well.  No sinus tenderness.  No significant pain over the sinuses.  Ceftriaxone was transitioned to Ceftin upon discharge.  Patient also had significant anemia and was given Aranesp injections.  She was slowed.  I will bowel movement.  MiraLAX.  7.  Peri-Colace were provided.  Patient ambulated but was quite weak.  Family declined any home therapies.  Daughter felt she could take care of her mother.    Disposition:home    Condition at Discharge and Prognosis: Condition was fair at time of discharge.  Patient still had rhonchi.  She continued to require dialysis and treatment of her chronic bronchitis.  Admission Date:04/25/2017  Discharge Date: 05/01/17  Type of Admission:Inpatient    Code Status: Full Code    Objective:     Vitals:    05/01/17 1247 05/01/17 1414 05/01/17 1458 05/01/17 1656   BP: 170/87  (!) 237/103 196/89   Pulse: 96 (!) 102  99   Resp: 18 16     Temp: 98.9 F (37.2 C) 98.1 F (36.7 C)     TempSrc:  Oral     SpO2: 99% 97%  Weight:       Height:           Discharge Medications:   Discharge Medications:     Discharge Medication List      Taking    acetaminophen 325 MG tablet  Dose:  650 mg  Commonly known as:  TYLENOL  Take 2 tablets (650 mg total) by mouth every 4 (four) hours as needed for Pain or Fever.     amLODIPine 10 MG tablet  Dose:  10 mg  Commonly known as:  NORVASC  Take 1 tablet (10 mg total) by mouth daily.     aspirin EC 325 MG EC tablet  Dose:  325 mg  Take 1 tablet (325 mg total) by mouth daily.     b complex-vitamin c-folic acid 0.8 MG Tabs  Dose:  1 tablet  Take 1 tablet by mouth daily.Use over the counter med     bumetanide 0.5 MG tablet  Commonly known as:  BUMEX  TAKE ONE TABLET BY MOUTH ONCE DAILY     carvedilol 25 MG  tablet  Dose:  12.5 mg  Commonly known as:  COREG  12.5 mg 2 (two) times daily with meals.     cefuroxime 500 MG tablet  Dose:  250 mg  Commonly known as:  CEFTIN  Take 0.5 tablets (250 mg total) by mouth 2 (two) times daily.     cloNIDine 0.2 MG tablet  Dose:  0.2 mg  Commonly known as:  CATAPRES  Take 1 tablet (0.2 mg total) by mouth 2 (two) times daily.     ferrous sulfate 324 (65 FE) MG Tbec  TAKE ONE TABLET BY MOUTH IN THE MORNING WITH BREAKFAST     fluticasone-salmeterol 100-50 MCG/DOSE Aepb  Dose:  1 puff  Commonly known as:  ADVAIR DISKUS  For:  Chronic Bronchitis  Inhale 1 puff into the lungs 2 (two) times daily.     hydrALAZINE 100 MG tablet  Dose:  100 mg  What changed:  when to take this  Commonly known as:  APRESOLINE  Take 1 tablet (100 mg total) by mouth 3 (three) times daily.     lactobacillus/streptococcus Caps  Dose:  1 capsule  Take 1 capsule by mouth daily.Use over the counter probiotic     ondansetron 4 MG disintegrating tablet  Dose:  4 mg  Commonly known as:  ZOFRAN-ODT  Take 1 tablet (4 mg total) by mouth every 6 (six) hours as needed for Nausea.     pantoprazole 40 MG tablet  Commonly known as:  PROTONIX  TAKE ONE TABLET BY MOUTH IN THE MORNING BEFORE BREAKFAST     polyethylene glycol packet  Dose:  17 g  Commonly known as:  MIRALAX  Take 17 g by mouth daily.     predniSONE 10 MG tablet  Dose:  10 mg  Commonly known as:  DELTASONE  Take 1 tablet (10 mg total) by mouth 2 (two) times daily.Take 10mg  twice a day for 4 days then once a day for 4 days     senna-docusate 8.6-50 MG per tablet  Dose:  2 tablet  Commonly known as:  PERICOLACE  Take 2 tablets by mouth 2 (two) times daily.     simvastatin 20 MG tablet  Dose:  20 mg  Commonly known as:  ZOCOR  Take 20 mg by mouth nightly.     traMADol 50 MG tablet  Dose:  50 mg  Commonly known as:  ULTRAM  Take  50 mg by mouth every 6 (six) hours as needed for Pain.     Vitamin D3 2000 units Tabs  Dose:  4000 IU  Take 4,000 IU by mouth daily.             Diet: Supervise For Meals Frequency: All meals  Diet renal 80 GM Protein; 1500 ML FLUID    Results of Labs/imaging:   Labs have been reviewed:   Coagulation Profile:       CBC review:   Recent Labs  Lab 05/01/17  0524 04/29/17  0854 04/27/17  0620 04/26/17  0639 04/25/17  1843   WBC 13.61* 6.59 6.04 8.78 9.34   Hgb 8.3* 8.1* 7.5* 7.5* 7.3*   Hematocrit 26.7* 26.2* 23.9* 23.6* 23.6*   Platelets 295 223 230 250 234   MCV 69.5* 71.4* 69.9* 70.4* 71.1*   RDW 15 15 16* 16* 16*   Neutrophils  --   --   --  66.7  --    Lymphocytes Automated  --   --   --  24.1  --    Eosinophils Automated  --   --   --  3.0  --    Immature Granulocyte  --   --   --  0.2  --    Neutrophils Absolute  --   --   --  5.86  --    Absolute Immature Granulocyte  --   --   --  0.02  --      Chem Review:  Recent Labs  Lab 05/01/17  0525 04/30/17  0732 04/29/17  0854 04/28/17  1416 04/28/17  0807 04/27/17  0620 04/26/17  0639   Sodium 133* 131* 132*  --   --  135* 136   Potassium 4.5 4.5 5.2* 4.3  --  5.2* 5.5*   Chloride 101 101 98*  --   --  104 109   CO2 25 24 23   --   --  25 22   BUN 32.6* 42.5* 32.1*  --   --  35.5* 58.8*   Creatinine 3.2* 4.0* 3.8*  --   --  3.2* 4.1*   Glucose 166* 187* 207*  --   --  116* 123*   Calcium 8.3* 8.5 8.6  --   --  8.1* 8.2*   Magnesium  --   --   --   --   --   --  2.5   Phosphorus  --   --   --   --  3.7  --  4.2     Results     Procedure Component Value Units Date/Time    Glucose Whole Blood - POCT [[id number]]  (Abnormal) Collected:  05/01/17 1630     Updated:  05/01/17 1636     POCT - Glucose Whole blood 201 (H) mg/dL     Glucose Whole Blood - POCT [[id number]]  (Abnormal) Collected:  05/01/17 1315     Updated:  05/01/17 1317     POCT - Glucose Whole blood 135 (H) mg/dL     Glucose Whole Blood - POCT [[id number]]  (Abnormal) Collected:  05/01/17 0721     Updated:  05/01/17 0727     POCT - Glucose Whole blood 153 (H) mg/dL     Basic Metabolic Panel [[id number]]  (Abnormal) Collected:  05/01/17 0525     Specimen:  Blood Updated:  05/01/17 0647     Glucose 166 (H) mg/dL      BUN  32.6 (H) mg/dL      Creatinine 3.2 (H) mg/dL      Calcium 8.3 (L) mg/dL      Sodium 133 (L) mEq/L      Potassium 4.5 mEq/L      Chloride 101 mEq/L      CO2 25 mEq/L      Anion Gap 7.0    GFR [[id number]] Collected:  05/01/17 0525     Updated:  05/01/17 0647     EGFR 14.6    CBC without differential [[id number]]  (Abnormal) Collected:  05/01/17 0524    Specimen:  Blood from Blood Updated:  05/01/17 0629     WBC 13.61 (H) x10 3/uL      Hgb 8.3 (L) g/dL      Hematocrit 26.7 (L) %      Platelets 295 x10 3/uL      RBC 3.84 (L) x10 6/uL      MCV 69.5 (L) fL      MCH 21.6 (L) pg      MCHC 31.1 (L) g/dL      RDW 15 %      MPV 10.7 fL      Nucleated RBC 0.4 /100 WBC      Absolute NRBC 0.05 (H) x10 3/uL     Glucose Whole Blood - POCT [[id number]]  (Abnormal) Collected:  04/30/17 2141     Updated:  04/30/17 2154     POCT - Glucose Whole blood 319 (H) mg/dL         Radiology reports have been reviewed:  Radiology Results (24 Hour)     ** No results found for the last 24 hours. **        Xr Chest 2 Views    Result Date: 04/26/2017  Indication: Cough. Procedure: AP and lateral views of the chest. Comparison: May 2018. Findings: Sternal wires are present. Mild cardiomegaly. There is a right internal jugular central line. Small left pleural effusion. Trace right pleural effusion.      Small effusions. No evidence of pneumonia. Roetta Sessions, MD 04/26/2017 11:02 AM    Ct Chest High Resolution    Result Date: 04/28/2017  CT OF THE CHEST WITH HIGH RESOLUTION AND WITHOUT INTRAVENOUS CONTRAST. CLINICAL HISTORY: Shortness of breath. Evaluate for interstitial lung disease. COMPARISON: Chest x-ray from 04/26/2017. TECHNIQUE:  CT of the chest was performed without intravenous contrast. The following dose reduction techniques were utilized: Automated exposure control and/or adjustment of the mA and/or kV according to patient size, and the use of iterative reconstruction  technique. FINDINGS: The central airways are patent. The heart is enlarged. No pericardial effusion. Coronary artery and aortic atherosclerotic calcifications are seen. The aorta is normal in course and caliber. There is a mildly enlarged precarinal lymph node measuring 1.7 x 1.1 cm which may be reactive. No focal consolidation or pneumothorax. There is a small left pleural effusion. No reticular opacification or bronchiectasis. There is minimal atelectasis in the periphery of the right middle and lingula. There is mild body wall edema. No acute bony findings. For unenhanced technique the visualized liver and spleen are within normal limits in appearance. There is circumferential wall thickening of the stomach which is also underdistended.     1. No evidence of interstitial lung disease. 2. Pronounced circumferential wall thickening of the stomach which is not well evaluated on this exam without oral and intravenous contrast. Gastritis could account for this appearance. Gastric malignancy is also within the differential diagnosis. This could  be further evaluated with a CT of  the abdomen and pelvis with oral and intravenous contrast as clinically indicated. 3. Small left pleural effusion. Lorenda Hatchet, MD 04/28/2017 6:12 PM    Pathology:   Specimens     None            Follow up recommendations:   Patient was instructed to follow up with Primary Care Doctor John Giovanni, MD in 1 week and with pulmonary in 2 weeks patient has also been placed on the Monday, Wednesday, Friday dialysis schedule.  She will go to dialysis tomorrow as an outpatient.  Pending Lab Results:   Labs/Images to be followed at your PCP office: Unresulted Labs     Procedure . . . Date/Time    Angiotensin converting enzyme [[id number]] Collected:  04/29/17 1826    Specimen:  Blood Updated:  04/29/17 1839    Narrative:       Cent ref within 1 hr    IgG Nelda Marseille [[id number]] Collected:  04/29/17 1826     Updated:  04/29/17 1839     Narrative:       Fasting specimen  Fasting specimen    IgE, Quantitative [[id number]] Collected:  04/29/17 1825    Specimen:  Blood Updated:  04/29/17 1839        Hospitalist:   Signed by: Timoteo Gaul  05/01/2017 6:06 PM  Time spent for discharge: 50 minutes    CC: John Giovanni, MD      *This note was generated by the Epic EMR system/ Dragon speech recognition and may contain inherent errors or omissions not intended by the user. Grammatical errors, random word insertions, deletions, pronoun errors and incomplete sentences are occasional consequences of this technology due to software limitations. Not all errors are caught or corrected. If there are questions or concerns about the content of this note or information contained within the body of this dictation they should be addressed directly with the author for clarification

## 2017-05-01 NOTE — Progress Notes (Signed)
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 Other?  CT negative for interstitial disease and pneumonia   Gastric thickening on CT, needing evaluation    Plan:   HD today   OP dialysis placement, as above.        Jodene Nam, MD  Office [phone]  Spectra Link - n/a    ++++++++++++++++++++++++++++++++++++++++++++++++++++++++++++++  Subjective:   complaints of cough    Medications:  Scheduled Meds:  Current Facility-Administered Medications   Medication Dose Route Frequency   . albuterol-ipratropium  3 mL Nebulization Q6H SCH   . amLODIPine  10 mg Oral Daily   . aspirin EC  325 mg Oral Daily   . b complex-vitamin c-folic acid  1 tablet Oral Daily at 1000   . carvedilol  12.5 mg Oral BID Meals   . cefTRIAXone  1 g Intravenous Q24H SCH   . darbepoetin alfa  60 mcg Subcutaneous Weekly   . fluticasone-salmeterol  1 puff Inhalation BID   . guaiFENesin  600 mg Oral Q12H [place]   . heparin (porcine)  5,000 Units Subcutaneous Q8H [facility]   . lactobacillus/streptococcus  1 capsule Oral Daily   . losartan  50 mg Oral Daily   . methylPREDNISolone  20 mg Intravenous TID   . pantoprazole  40 mg Oral QAM AC   . polyethylene glycol  17 g Oral Daily   . senna-docusate  2 tablet Oral BID   . simvastatin  20 mg Oral QHS   . vitamin D  2 tablet Oral Daily     Continuous Infusions:  PRN Meds:acetaminophen, acetaminophen, albumin human, Nursing communication: Adult Hypoglycemia Treatment Algorithm **AND** dextrose **AND** dextrose **AND** glucagon (rDNA), heparin (porcine), hydrALAZINE, insulin lispro, insulin lispro, naloxone, ondansetron, sodium chloride, sodium chloride, traMADol    Objective:  Vital signs in last 24 hours:  Temp:  [98.1  F (36.7 C)-99.7 F (37.6 C)] 99.2 F (37.3 C)  Heart Rate:  [64-104] 91  Resp Rate:  [16-20] 20  BP: (154-196)/(75-99) 167/89  Intake/Output last 24 hours:    Intake/Output Summary (Last 24 hours) at 05/01/17 1127  Last data filed at 05/01/17 0414   Gross per 24 hour   Intake             1190 ml   Output             4300 ml   Net            -3110 ml     Intake/Output this shift:  No intake/output data recorded.    Physical Exam:   Gen: alert, well developed, well nourished, No distress   Cardiac:  S1 S2,  no rub, no murmer   Chest: coarse rhonchi, no crackles, no wheezes   Abdomen: non distended, soft, non tender, no organomegaly   Ext: No edema              Vascular access, permcath.       Labs:    Recent Labs  Lab 05/01/17  0525 04/30/17  0732 04/29/17  0854  04/28/17  0807  04/26/17  0639   Glucose 166* 187* 207*  --   --  More results in Results Review 123*   BUN 32.6* 42.5* 32.1*  --   --  More results in Results Review 58.8*   Creatinine 3.2* 4.0* 3.8*  --   --  More results in Results Review 4.1*   Calcium 8.3* 8.5 8.6  --   --  More results in Results Review 8.2*   Sodium 133* 131* 132*  --   --  More results in Results Review 136   Potassium 4.5 4.5 5.2* More results in Results Review  --  More results in Results Review 5.5*   Chloride 101 101 98*  --   --  More results in Results Review 109   CO2 25 24 23   --   --  More results in Results Review 22   Phosphorus  --   --   --   --  3.7  --  4.2   Magnesium  --   --   --   --   --   --  2.5   More results in Results Review = values in this interval not displayed.    Recent Labs  Lab 05/01/17  0524 04/29/17  0854 04/27/17  0620   WBC 13.61* 6.59 6.04   Hgb 8.3* 8.1* 7.5*   Hematocrit 26.7* 26.2* 23.9*   MCV 69.5* 71.4* 69.9*   MCH 21.6* 22.1* 21.9*   MCHC 31.1* 30.9* 31.4*   RDW 15 15 16*   MPV 10.7 10.6 10.5   Platelets 295 223 230

## 2017-05-01 NOTE — Procedures (Signed)
Dialysis Treatment Note:    3.5 hour tx well tolerated by pt; used 3 K 2.5 Ca acid bath (per order).  Net fluid removal: 2.5 L (2,500 mL). Good blood flow from permacath with lines reversed, arterial line spasms w/ lines in regular position.     Patient:  Jacqueline Weber MRN#:  [id number]  Unit/Room/Bed:  M260/M260-A   Isolation: None       Allergies:   Allergies   Allergen Reactions   . Mosquito (Culex Pipiens) Allergy Skin Test Shortness Of Breath     And  Trouble  breathing   . Shellfish-Derived Products Hives     New  allergy  New  allergy   . Lisinopril Hives     Patient cannot remember the exact nature of the allergy   . Penicillins Rash     Swelling,  numbness     Code Status: Full Code    Problem List:   Patient Active Problem List    Diagnosis Date Noted   . Anemia in chronic kidney disease (CODE) 04/28/2017   . Iron deficiency anemia, unspecified iron deficiency anemia type 04/28/2017   . Acute bronchitis 04/26/2017   . End stage renal disease 04/25/2017   . Pre-diabetes    . Chronic renal impairment, unspecified CKD stage    . Acute hyponatremia 07/06/2016   . NSTEMI (non-ST elevated myocardial infarction) 06/08/2016   . Elevated brain natriuretic peptide (BNP) level    . Hyponatremia    . Renal insufficiency    . History of CVA (cerebrovascular accident)    . Hyperlipidemia 03/11/2013   . HTN (hypertension) urgency 03/09/2013       Dialysis Order:  Active Orders   Dialysis    Hemodialysis inpatient     Frequency: Once     Start Date/Time: 05/01/17 0734     Number of Occurrences:  1 Occurrences     Order Questions:     . Date of Dialysis 05/01/2017     . K+ (Initial) 3 mEq     . KPP (Per Protocol) Yes     . Ca++ 2.5 mEq     . Bicarb 35 mEq     . Na+ 138 mEq     . Dialyzer F160     . Dialysate Temperature (C) 37     . BFR-As tolerated to a maximum of: 350 mL/min     . DFR 700 mL/min     . Duration of Treatment 3.5 Hours     . Fluid Removal (L) and Dry Weight (Kg) 2.5L        Dialysis Treatment Type:    Time  out/Safety Check: Time Out/Safety Check Completed: Yes (05/01/17 0830)  [facility] Consent: Consent for HD signed for this hospitalization: Yes (05/01/17 0830)  Blood Consent (if applicable): Blood Consent Verified: Not Applicable (50/15/86 8257)    Dialysis Access:  Permacath/Temporary Catheter 04/25/17 Permacath Right (Active)   Line necessity reviewed? Dialysis 05/01/2017  9:03 AM   Catheter Lumen Volume Venous 1.7 mL 05/01/2017 12:47 PM   Catheter Lumen Volume Arterial 1.6 mL 05/01/2017 12:47 PM   Dressing Status and Intervention Dressing Intact;Antibacterial Disc Applied;Clean & Dry 05/01/2017 12:47 PM   Tego Caps on Catheter Yes 05/01/2017 12:47 PM   NEW Tego Caps placed (Date) 04/30/17 05/01/2017 12:47 PM   APHERESIS ONLY:  Access  Red 04/27/2017  9:55 AM   APHERESIS ONLY: Return Blue 04/27/2017  9:55 AM   End Caps Free From Blood Yes  05/01/2017  9:03 AM   Line Care Connections checked and tightened 05/01/2017  9:03 AM   Dressing Type Transparent 05/01/2017  9:03 AM   Dressing Status Clean;Dry;Intact 05/01/2017  9:03 AM   Dressing/Line Intervention Caps changed 04/30/2017  9:30 AM   Dressing Change Assistant ([facility] Only) none 04/30/2017  9:30 AM   Biopatch Used? ([facility] Only) Yes 04/30/2017  9:30 AM   Curos Caps Used? ([facility] Only) Yes 04/30/2017  9:30 AM   Line Used For Blood Draw No 04/30/2017  9:30 AM   Dressing Change Due 05/03/17 05/01/2017  9:03 AM           General Assessments:  Pt alert & oriented x 3 pre tx. No distress.     Pain Assessment: Pain Assessment  Charting Type: Assessment (05/01/17 1247)  GWN Pain Level (Read Only): 0 (04/29/17 1052)  Pain Scale Used: Numeric Scale (0-10) (05/01/17 1247)    Labs Values:    HBsAg Result:HBsAg (Antigen) Result: Negative (05/01/17 0830)  HBsAg Date Drawn: HBsAg Date Drawn: 04/26/17 (05/01/17 0830)  HBsAg Next Due Date:HBsAg Repeat Draw Due Date: 05/25/17 (05/01/17 0830)    Lab Results   Component Value Date    BUN 32.6 (H) 05/01/2017    NA 133 (L) 05/01/2017    K 4.5  05/01/2017    CREAT 3.2 (H) 05/01/2017    CO2 25 05/01/2017    CA 8.3 (L) 05/01/2017    PHOS 3.7 04/28/2017    GLU 166 (H) 05/01/2017    ALB 2.2 (L) 04/10/2017    HGB 8.3 (L) 05/01/2017    HCT 26.7 (L) 05/01/2017    WBC 13.61 (H) 05/01/2017    PLT 295 05/01/2017    PTT 40 (H) 06/05/2016    PT 15.6 (H) 06/12/2016    INR 1.2 (H) 06/12/2016         Current Diet Order      Diet renal 80 GM Protein; 1500 ML FLUID     RO/Hemodialysis Machine Safety Checks - Before Each Treatment:  RO/Hemodialysis Archivist Number: 2 (05/01/17 0830)  RO #: 44 (05/01/17 0830)  Water Hardness: 0.3 (05/01/17 0830)  pH: 7.6 (05/01/17 0830)  Pressure Test Verified: Yes (05/01/17 0830)  Alarms Verified: Passed (05/01/17 0830)  Machine Temperature: 98.6 F (37 C) (05/01/17 0830)  Alarms Verified: Yes (05/01/17 0830)  Na+ mEq (Machine): 138 mEq (05/01/17 0830)  Bicarb mEq (Machine): 35 mEq (05/01/17 0830)  Hemodialysis Conductivity (Machine): 13.7 (05/01/17 0830)  Hemodialysis Conductivity (Meter): 13.6 (05/01/17 0830)  Dialyzer Lot Number: [id number] (05/01/17 0830)  Tubing Lot Number: [id number] (05/01/17 0830)  RO Machine Log Completed: Yes (05/01/17 0830)    Chlorine Testing:  RO/Hemodialysis Machine Safety Checks  Is Total Chlorine less than 0.1 ppm?: Yes (05/01/17 0830)  Orignial Total Chlorine Testing Time: 0740 (05/01/17 0830)  At 4 Hour Total Chlorine Testing Time: 5697 (05/01/17 0830)    Vitals and Weight:  Patient Vitals for the past 4 hrs:   BP Temp Pulse Resp   05/01/17 1414 - 98.1 F (36.7 C) (!) 102 16   05/01/17 1247 170/87 98.9 F (37.2 C) 96 18   05/01/17 1230 (!) 169/94 - 94 -   05/01/17 1215 (!) 163/93 - 97 -   05/01/17 1200 (!) 184/95 - 97 -   05/01/17 1145 (!) 162/91 - 90 -   05/01/17 1130 176/88 - 90 -   05/01/17 1115 167/89 - 91 -   05/01/17 1100 162/85 - 84 -  05/01/17 1045 154/79 - 85 -        Dialysis Weight  Pre-Treatment Weight (Kg): 61.9 (04/30/17 0945)  Scale Type: ICU Bed Scale (04/30/17  0945)  Post-Treatment Weight (Kg): 55.1 (05/01/17 1247)    Treatment:       05/01/17 0903 05/01/17 0915 05/01/17 0930   Vitals   Heart Rate 91 88 96   BP 176/90 179/88 169/86   O2 Device None (Room air) None (Room air) None (Room air)   Machine Metrics   $Treatment Started/Capturing Charge Yes --  --    Blood Flow Rate (mL/min) 350 mL/min 350 mL/min 350 mL/min   Arterial Pressure (mmHg) -140 mmHg -180 mmHg -180 mmHg   Venous Pressure (mmHg) 110 90 90   Dialysate Flow Rate (mL/min) 700 mL/min 700 mL/min 700 mL/min   Transmembrane Pressure (mmHg) 50 mmHg 50 mmHg 50 mmHg   Ultrafiltration Rate (mL/Hr) 910 mL/hr 910 mL/hr 910 mL/hr   Fluid Removal (ml) 0 ml 145 ml 303 ml   Fluid Bolus (ml) 250 ml 0 ml 0 ml   Dialysate K (mEq/L) 3 mEq/L 3 mEq/L 3 mEq/L   Dialysate CA (mEq/L) 2.5 mEq/L 2.5 mEq/L 2.5 mEq/L   Hemodialysis Comments   Arteriovenous Lines Secure Yes Yes Yes   Comments pt stable. tx started via R tunneled permcath.  lines reversed d/t arterial line spasming. pt stable.  pt stable. no distress.         05/01/17 0945 05/01/17 1000 05/01/17 1015   Vitals   Heart Rate 87 85 94   BP 170/80 160/81 (!) 178/92   O2 Device None (Room air) None (Room air) None (Room air)   Machine Metrics   Blood Flow Rate (mL/min) 350 mL/min 350 mL/min 350 mL/min   Arterial Pressure (mmHg) -190 mmHg -190 mmHg -200 mmHg   Venous Pressure (mmHg) 90 90 100   Dialysate Flow Rate (mL/min) 700 mL/min 700 mL/min 700 mL/min   Transmembrane Pressure (mmHg) 50 mmHg 50 mmHg 50 mmHg   Ultrafiltration Rate (mL/Hr) 910 mL/hr 910 mL/hr 910 mL/hr   Fluid Removal (ml) 530 ml 769 ml 956 ml   Fluid Bolus (ml) 0 ml 0 ml 0 ml   Dialysate K (mEq/L) 3 mEq/L 3 mEq/L 3 mEq/L   Dialysate CA (mEq/L) 2.5 mEq/L 2.5 mEq/L 2.5 mEq/L   Hemodialysis Comments   Arteriovenous Lines Secure Yes Yes Yes   Comments resting. respirations even & unlabored.  access & lines visible & secure.  pt alert & stable.         05/01/17 1030 05/01/17 1045 05/01/17 1100   Vitals   Heart  Rate 90 85 84   BP 171/87 154/79 162/85   O2 Device None (Room air) None (Room air) None (Room air)   Machine Metrics   Blood Flow Rate (mL/min) 350 mL/min 350 mL/min 350 mL/min   Arterial Pressure (mmHg) -200 mmHg -200 mmHg -190 mmHg   Venous Pressure (mmHg) 90 90 90   Dialysate Flow Rate (mL/min) 700 mL/min 700 mL/min 700 mL/min   Transmembrane Pressure (mmHg) 50 mmHg 50 mmHg 50 mmHg   Ultrafiltration Rate (mL/Hr) 910 mL/hr 910 mL/hr 920 mL/hr   Fluid Removal (ml) 1215 ml 1530 ml 1625 ml   Fluid Bolus (ml) 0 ml 100 ml  (NS flush to prevent HD system clotting) 0 ml   Dialysate K (mEq/L) 3 mEq/L 3 mEq/L 3 mEq/L   Dialysate CA (mEq/L) 2.5 mEq/L 2.5 mEq/L 2.5 mEq/L   Hemodialysis  Comments   Arteriovenous Lines Secure Yes Yes Yes   Comments pt resting, stable.  no distress.  stable.         05/01/17 1115 05/01/17 1130 05/01/17 1145   Vitals   Heart Rate 91 90 90   BP 167/89 176/88 (!) 162/91   O2 Device None (Room air) None (Room air) None (Room air)   Machine Metrics   Blood Flow Rate (mL/min) 350 mL/min 350 mL/min 350 mL/min   Arterial Pressure (mmHg) -200 mmHg -200 mmHg -200 mmHg   Venous Pressure (mmHg) 90 100 100   Dialysate Flow Rate (mL/min) 700 mL/min 700 mL/min 700 mL/min   Transmembrane Pressure (mmHg) 50 mmHg 50 mmHg 50 mmHg   Ultrafiltration Rate (mL/Hr) 920 mL/hr 920 mL/hr 920 mL/hr   Fluid Removal (ml) 1914 ml 2110 ml 2336 ml   Fluid Bolus (ml) 0 ml 0 ml 0 ml   Dialysate K (mEq/L) 3 mEq/L 3 mEq/L 3 mEq/L   Dialysate CA (mEq/L) 2.5 mEq/L 2.5 mEq/L 2.5 mEq/L   Hemodialysis Comments   Arteriovenous Lines Secure Yes Yes Yes   Comments resting. stable.  denies c/o.  stable.         05/01/17 1200 05/01/17 1215 05/01/17 1230   Vitals   Heart Rate 97 97 94   BP (!) 184/95 (!) 163/93 (!) 169/94   O2 Device None (Room air) None (Room air) None (Room air)   Machine Metrics   Blood Flow Rate (mL/min) 350 mL/min 350 mL/min 350 mL/min   Arterial Pressure (mmHg) -210 mmHg -210 mmHg -210 mmHg   Venous Pressure (mmHg) 100  100 100   Dialysate Flow Rate (mL/min) 700 mL/min 700 mL/min 700 mL/min   Transmembrane Pressure (mmHg) 50 mmHg 40 mmHg 50 mmHg   Ultrafiltration Rate (mL/Hr) 920 mL/hr 920 mL/hr 920 mL/hr   Fluid Removal (ml) 2551 ml 2799 ml 3016 ml   Fluid Bolus (ml) 0 ml 0 ml 0 ml   Dialysate K (mEq/L) 3 mEq/L 3 mEq/L 3 mEq/L   Dialysate CA (mEq/L) 2.5 mEq/L 2.5 mEq/L 2.5 mEq/L   Hemodialysis Comments   Arteriovenous Lines Secure Yes Yes Yes   Comments no distress.  resting. stable.  stable.          Intake/Output:  I/O this shift:  In: -   Out: 2500 [Other:2500]      Medications:  Current Facility-Administered Medications   Medication Dose Route Last Rate Last Dose        Education:  Education  Person taught: Patient (05/01/17 1247)  Knowledge basis: None (05/01/17 1247)  Topics taught: Procedure;Other (05/01/17 1247)  Teaching Tools: Explain (05/01/17 1247)  Reponse: Needs Reinforcement (05/01/17 1247)    Primary Nurse Communication:  Bedside Nurse Communication  Name of bedside RN - pre dialysis: Danny Lawless, RN (05/01/17 0830)  Name of bedside RN - post dialysis: Danny Lawless, RN (05/01/17 1247)      [facility] nurse signature/date/time: Lorenza Burton, RN 05/01/2017 14:45

## 2017-05-01 NOTE — Discharge Summary -  Nursing (Addendum)
AVS reviewed with patient and daughter. Denies pain. BP discussed with attending, patient, and daughter at bedside. Denies headache at this time. Wheelchair escort provided.

## 2017-05-02 LAB — ANGIOTENSIN CONVERTING ENZYME: Angiotensin-Converting Enzyme: 57 U/L (ref 9–67)

## 2017-05-02 LAB — IGE: Immunoglobulin E: 255 — ABNORMAL HIGH (ref <=114)

## 2017-05-02 LAB — IGG SUBCLASSES, S
IgG Subclass 1: 619 mg/dL (ref 382–929)
IgG Subclass 2: 349 mg/dL (ref 241–700)
IgG Subclass 3: 122 mg/dL (ref 22–178)
IgG Subclass 4: 69.3 mg/dL (ref 4.0–86.0)
Immunoglobulin G: 1083 mg/dL (ref 694–1618)

## 2017-05-03 ENCOUNTER — Telehealth: Payer: Self-pay

## 2017-05-03 NOTE — Telephone Encounter (Signed)
Post Acute Discharge Call    Hospital /  ED Discharge Date: [facility] / 05/01/17  Primary Discharge Dx: End stage renal disease   Secondary Dx:   Hospital / ED Discharge Appt with PCP: NONE       Placed call to patient to follow up on recent hospital discharge, assess current status, address questions/concerns regarding discharge instructions / medications and encourage patient to schedule Hospital Discharge follow up appt with PCP; No Answer; LVMM with contact information and request for return call.    Left message for Patient's daughter.

## 2017-05-03 NOTE — Telephone Encounter (Signed)
Post Acute Discharge Call    Hospital /  ED Discharge Date: [facility] / 05/01/2017  Primary Discharge Dx: End stage renal disease  Secondary Dx:   Hospital / ED Discharge Appt with PCP: [employer] to leave a message. Interpreter ID # [phone]     Placed call to patient to follow up on recent hospital discharge, assess current status, address questions/concerns regarding discharge instructions / medications and encourage patient to schedule Hospital Discharge follow up appt with PCP; No Answer; LVMM with contact information and request for return call.  Will continue to follow.

## 2017-05-04 MED ORDER — DARBEPOETIN ALFA 60 MCG/0.3ML IJ SOSY
60.0000 ug | PREFILLED_SYRINGE | Freq: Once | INTRAMUSCULAR | Status: AC
Start: 2017-05-08 — End: 2017-05-08
  Administered 2017-05-08: 12:00:00 60 ug via SUBCUTANEOUS

## 2017-05-08 ENCOUNTER — Ambulatory Visit (INDEPENDENT_AMBULATORY_CARE_PROVIDER_SITE_OTHER): Payer: Medicare Other | Admitting: Specialist

## 2017-05-08 ENCOUNTER — Encounter (INDEPENDENT_AMBULATORY_CARE_PROVIDER_SITE_OTHER): Payer: Self-pay | Admitting: Specialist

## 2017-05-08 ENCOUNTER — Ambulatory Visit
Admission: RE | Admit: 2017-05-08 | Discharge: 2017-05-08 | Disposition: A | Discharge status: Home / Self Care | Payer: Medicare Other | Source: Ambulatory Visit | Attending: Nephrology | Admitting: Nephrology

## 2017-05-08 ENCOUNTER — Ambulatory Visit: Payer: Medicare Other

## 2017-05-08 VITALS — BP 195/88 | HR 83 | Temp 98.4°F | Resp 16

## 2017-05-08 DIAGNOSIS — D631 Anemia in chronic kidney disease: Secondary | ICD-10-CM | POA: Insufficient documentation

## 2017-05-08 DIAGNOSIS — N185 Chronic kidney disease, stage 5: Secondary | ICD-10-CM | POA: Insufficient documentation

## 2017-05-08 HISTORY — DX: Chronic kidney disease, stage 5: N18.5

## 2017-05-08 LAB — FERRITIN: Ferritin: 270.13 ng/mL — ABNORMAL HIGH (ref 4.60–204.00)

## 2017-05-08 LAB — CBC
Absolute NRBC: 0.02 10*3/uL — ABNORMAL HIGH
Hematocrit: 27.6 % — ABNORMAL LOW (ref 37.0–47.0)
Hgb: 8.6 g/dL — ABNORMAL LOW (ref 12.0–16.0)
MCH: 23 pg — ABNORMAL LOW (ref 28.0–32.0)
MCHC: 31.2 g/dL — ABNORMAL LOW (ref 32.0–36.0)
MCV: 73.8 fL — ABNORMAL LOW (ref 80.0–100.0)
MPV: 10.7 fL (ref 9.4–12.3)
Nucleated RBC: 0.2 /100 WBC (ref 0.0–1.0)
Platelets: 248 10*3/uL (ref 140–400)
RBC: 3.74 10*6/uL — ABNORMAL LOW (ref 4.20–5.40)
RDW: 19 % — ABNORMAL HIGH (ref 12–15)
WBC: 11.96 10*3/uL — ABNORMAL HIGH (ref 3.50–10.80)

## 2017-05-08 LAB — HEMOLYSIS INDEX: Hemolysis Index: 6 (ref 0–18)

## 2017-05-08 MED ORDER — DARBEPOETIN ALFA 60 MCG/0.3ML IJ SOSY
PREFILLED_SYRINGE | INTRAMUSCULAR | Status: AC
Start: 2017-05-08 — End: ?
  Filled 2017-05-08: qty 0.3

## 2017-05-08 NOTE — Progress Notes (Signed)
[facility]    Chief Complaint   Patient presents with   . Advice Only     consult fistula placement         History of Present Illness     Jacqueline Weber is a 66 y.o. female who presents With stage.  5.  Kidney disease.  Patient has been on hemodialysis for one week and is right-handed.    Past Medical History     Past Medical History:   Diagnosis Date   . Anemia 10/02/2016    H/O ANEMIA - REFLECTED ON CURRENT LABS.   [name] Cataracts, bilateral     H/O CATARACTS - SURGERY DONE - STILL HAS SOME ISSUES.   . Cerebrovascular accident     H/O STROKE - WEAKNESS ON LEFT SIDE. NO NEUROLOGIST.    [name] Chronic renal failure, stage 5 05/08/2017   . Constipation    . Coronary artery disease 06/2016    H/O QUADRUPLE BYPASS SURGERY - FOLLOWED BY [facility].   [name] History of MI (myocardial infarction)    . 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 Hypercholesteremia     CONTROLLED WITH MEDS.   [name] Hypertension     CONTROLLED WITH MEDS.   . Pre-diabetes    . Renal insufficiency     CHRONIC KIDNEY DISEASE - FOLLOWED BY DR. Kate Sable.       Past Surgical History     Past Surgical History:   Procedure Laterality Date   . CORONARY ARTERY BYPASS N/A 06/12/2016    Procedure: Coronary Artery Bypass x 4.;  Surgeon: Bjorn Loser, MD;  Location: [facility] OR;  Service: Cardiothoracic;  Laterality: N/A;  LIMA to LDA  Vein to PDA  Vein to OM  Vein to Diagonal   . EGD, COLONOSCOPY N/A 10/04/2016   . EGD, COLONOSCOPY N/A 10/04/2016    Procedure: EGD w/ bx, COLONOSCOPY w/ bx, polypectomy;  Surgeon: Norton Blizzard, MD;  Location: [facility];  Service: Gastroenterology;  Laterality: N/A;   . ENDOSCOPIC,VEIN HARVEST N/A 06/12/2016    Procedure: Endoscopic, Left Leg Greater Saphenous Vein Harvest;  Surgeon: Bjorn Loser, MD;  Location: [facility] OR;  Service: Cardiothoracic;  Laterality: N/A;  Left Leg Incision (medially, groin to ankle) @ 0742  Vein out of leg @ 0826  Vein prep complete @ 0845  Total time = 63 minutes       Family History      Family History   Problem Relation Age of Onset   . No known problems Mother    . No known problems Father        Social History     Social History     Social History   . Marital status: Widowed     Spouse name: N/A   . Number of children: N/A   . Years of education: N/A     Occupational History   . Not on file.     Social History Main Topics   . Smoking status: Never Smoker   . Smokeless tobacco: Never Used   . Alcohol use No   . Drug use: No   . Sexual activity: Not on file     Other Topics Concern   . Not on file     Social History Narrative   . No narrative on file       Allergies     Allergies   Allergen Reactions   .  Mosquito (Culex Pipiens) Allergy Skin Test Shortness Of Breath     And  Trouble  breathing   . Shellfish-Derived Products Hives     New  allergy  New  allergy   . Lisinopril Hives     Patient cannot remember the exact nature of the allergy   . Penicillins Rash     Swelling,  numbness       Medications     Current Outpatient Prescriptions on File Prior to Visit   Medication Sig Dispense Refill   . acetaminophen (TYLENOL) 325 MG tablet Take 2 tablets (650 mg total) by mouth every 4 (four) hours as needed for Pain or Fever.     [name] amLODIPine (NORVASC) 10 MG tablet Take 1 tablet (10 mg total) by mouth daily. 90 tablet 1   . aspirin EC 325 MG EC tablet Take 1 tablet (325 mg total) by mouth daily. 30 tablet 0   . b complex-vitamin c-folic acid (NEPHRO-VITE) 0.8 MG Tab Take 1 tablet by mouth daily.Use over the counter med     . bumetanide (BUMEX) 0.5 MG tablet TAKE ONE TABLET BY MOUTH ONCE DAILY 90 tablet 1   . carvedilol (COREG) 25 MG tablet 12.5 mg 2 (two) times daily with meals.         . cefuroxime (CEFTIN) 500 MG tablet Take 0.5 tablets (250 mg total) by mouth 2 (two) times daily. 10 tablet 0   . Cholecalciferol (VITAMIN D3) 2000 units Tab Take 4,000 IU by mouth daily.     . cloNIDine (CATAPRES) 0.2 MG tablet Take 1 tablet (0.2 mg total) by mouth 2 (two) times daily. 60 tablet 0   . ferrous  sulfate 324 (65 FE) MG Tablet Delayed Response TAKE ONE TABLET BY MOUTH IN THE MORNING WITH BREAKFAST 90 tablet 1   . fluticasone-salmeterol (ADVAIR DISKUS) 100-50 MCG/DOSE Aerosol Powder, Breath Activtivatede Inhale 1 puff into the lungs 2 (two) times daily. 1 each 0   . hydrALAZINE (APRESOLINE) 100 MG tablet Take 1 tablet (100 mg total) by mouth 3 (three) times daily. (Patient taking differently: Take 100 mg by mouth 2 (two) times daily.    ) 270 tablet 1   . lactobacillus/streptococcus (RISAQUAD) Cap Take 1 capsule by mouth daily.Use over the counter probiotic     . ondansetron (ZOFRAN-ODT) 4 MG disintegrating tablet Take 1 tablet (4 mg total) by mouth every 6 (six) hours as needed for Nausea. 20 tablet 3   . pantoprazole (PROTONIX) 40 MG tablet TAKE ONE TABLET BY MOUTH IN THE MORNING BEFORE BREAKFAST 90 tablet 1   . polyethylene glycol (MIRALAX) packet Take 17 g by mouth daily. 30 each 3   . predniSONE (DELTASONE) 10 MG tablet Take 1 tablet (10 mg total) by mouth 2 (two) times daily.Take 10mg  twice a day for 4 days then once a day for 4 days 12 tablet 0   . senna-docusate (PERICOLACE) 8.6-50 MG per tablet Take 2 tablets by mouth 2 (two) times daily. 30 tablet 0   . simvastatin (ZOCOR) 20 MG tablet Take 20 mg by mouth nightly.         . traMADol (ULTRAM) 50 MG tablet Take 50 mg by mouth every 6 (six) hours as needed for Pain.       Current Facility-Administered Medications on File Prior to Visit   Medication Dose Route Frequency Provider Last Rate Last Dose   . darbepoetin alfa (ARANESP) injection 60 mcg  60 mcg Subcutaneous Once Queen Slough, MD  Review of Systems     Constitutional: Negative for fevers and chills  Skin: No rash or lesions  Respiratory: Negative for cough, wheezing, or hemoptysis  Cardiovascular: as per HPI  Gastrointestinal: Negative for abdominal pain, nausea, vomiting and diarrhea  Musculoskeletal:  No arthritic symptoms  Genitourinary: Negative for dysuria  All other systems were  reviewed and are negative except what is stated in the HPI      Physical Exam     Vitals:    05/08/17 0942   BP: 172/87   Pulse: 82       There is no height or weight on file to calculate BMI.    General: Patient appears their stated age, well-nourished. Alert and in no apparent distress.  Lungs: Respiratory effort unlabored, chest expansion symmetric.  Cardiac: RRR, no carotid bruits, no JVD.   Extremities warm,   Abd: Soft, nondistended, nontender. No guarding or rebound, No mid line pulsatile mass   RJJ:OACZ ROM in all 4 extremities, symmetric right IJ permacath  Skin: Color appropriate for race, Skin warm, dry, no gangrene, no non healing ulcers, no varicose veins , no hyperpigmentation, no lipo-dermatosclerosis  Neuro: Good insight and judgment, oriented to person, place, and time CN II-XII intact, gross motor and sensory intact    Labs     CBC:   WBC   Date/Time Value Ref Range Status   05/01/2017 05:24 AM 13.61 (H) 3.50 - 10.80 x10 3/uL Final     RBC   Date/Time Value Ref Range Status   05/01/2017 05:24 AM 3.84 (L) 4.20 - 5.40 x10 6/uL Final     Hgb   Date/Time Value Ref Range Status   05/01/2017 05:24 AM 8.3 (L) 12.0 - 16.0 g/dL Final     Hematocrit   Date/Time Value Ref Range Status   05/01/2017 05:24 AM 26.7 (L) 37.0 - 47.0 % Final     MCV   Date/Time Value Ref Range Status   05/01/2017 05:24 AM 69.5 (L) 80.0 - 100.0 fL Final     MCHC   Date/Time Value Ref Range Status   05/01/2017 05:24 AM 31.1 (L) 32.0 - 36.0 g/dL Final     RDW   Date/Time Value Ref Range Status   05/01/2017 05:24 AM 15 12 - 15 % Final     Platelets   Date/Time Value Ref Range Status   05/01/2017 05:24 AM 295 140 - 400 x10 3/uL Final       CMP:   Sodium   Date/Time Value Ref Range Status   05/01/2017 05:25 AM 133 (L) 136 - 145 mEq/L Final     Potassium   Date/Time Value Ref Range Status   05/01/2017 05:25 AM 4.5 3.5 - 5.1 mEq/L Final     Chloride   Date/Time Value Ref Range Status   05/01/2017 05:25 AM 101 100 - 111 mEq/L Final      CO2   Date/Time Value Ref Range Status   05/01/2017 05:25 AM 25 22 - 29 mEq/L Final     Glucose   Date/Time Value Ref Range Status   05/01/2017 05:25 AM 166 (H) 70 - 100 mg/dL Final     Comment:     ADA guidelines for diabetes mellitus:  Fasting:  Equal to or greater than 126 mg/dL  Random:   Equal to or greater than 200 mg/dL       BUN   Date/Time Value Ref Range Status   05/01/2017 05:25 AM 32.6 (H) 7.0 - 19.0  mg/dL Final     Protein, Total   Date/Time Value Ref Range Status   03/08/2017 10:05 AM 5.8 (L) 6.0 - 8.3 g/dL Final   06/02/2016 04:21 AM 4.6 (L) 6.0 - 8.3 g/dL Final     Alkaline Phosphatase   Date/Time Value Ref Range Status   03/08/2017 10:05 AM 81 37 - 106 U/L Final     AST (SGOT)   Date/Time Value Ref Range Status   03/08/2017 10:05 AM 23 5 - 34 U/L Final     ALT   Date/Time Value Ref Range Status   03/08/2017 10:05 AM 15 0 - 55 U/L Final     Anion Gap   Date/Time Value Ref Range Status   05/01/2017 05:25 AM 7.0 5.0 - 15.0 Final       Lipid Panel   Cholesterol   Date/Time Value Ref Range Status   10/16/2016 10:34 AM 146 0 - 199 mg/dL Final     Triglycerides   Date/Time Value Ref Range Status   10/16/2016 10:34 AM 65 34 - 149 mg/dL Final     HDL   Date/Time Value Ref Range Status   10/16/2016 10:34 AM 50 40 - 9,999 mg/dL Final     Comment:     An HDL cholesterol <40 mg/dL is low and constitutes a  coronary heart disease risk factor, and HDL-C>59 mg/dL is  a negative risk factor for CHD.  Ref: American Heart Association; Circulation 2004         Coags:   PT   Date/Time Value Ref Range Status   06/12/2016 11:48 AM 15.6 (H) 12.6 - 15.0 sec Final     PT INR   Date/Time Value Ref Range Status   06/12/2016 11:48 AM 1.2 (H) 0.9 - 1.1 Final     Comment:     Recommended Ranges for Protime INR:    2.0-3.0 for most medical and surgical thromboembolic states    1.4-4.8 for artificial heart valves  INR result may not represent exact Warfarin dosing level during  the transition period from Heparin to Warfarin  therapy.  Recommend close clinical monitoring.       PTT   Date/Time Value Ref Range Status   06/05/2016 05:52 AM 40 (H) 23 - 37 sec Final     Comment:     In vivo therapeutic range of heparin (0.3 - 0.7 IU/mL)  correlate with the following APTT times: 64 - 102 seconds.           Diagnostic Imaging     I have reviewed and interpreted the vascular diagnostic imaging with the patient    Assessment and Plan       1. Chronic renal failure, stage 5         Has stage V.  Kidney disease and is right-handed.  The vein mapping was reviewed and may have a reasonable left forearm cephalic vein.  Therefore, I would proceed with either left forearm cephalic vein AV fistula formation versus two-stage left upper arm transposition.  I will make that decision intraoperatively.  This was discussed with the patient's daughter and has agreed.    The risks and benefits of the procedures were explained to the patient and agreed. The risk of infection, bleeding, thrombosis, steal syndrome, nerve injury and other complications were explained in detail and agreed.     Dr. Kate Sable , I would like to thank you for allowing me to participate in the care of this patient  With regards    Shary Key, MD, [place], [facility]  Chief, Section of Vascular [facility]

## 2017-05-08 NOTE — Progress Notes (Signed)
Time: [place] is a 66 y.o. female here for Aranesp injection.  Offers no c/o's.  Hgb is 8.3 and Hct is 26.7 on 05/01/17.  Aranesp 60 mcg given SQ into right arm without incident.  Please see MAR, VS for more details.  [id number], pt discharged stable, NAD.  RTC 06-05-17    Truddie Coco, RN

## 2017-05-13 ENCOUNTER — Encounter (INDEPENDENT_AMBULATORY_CARE_PROVIDER_SITE_OTHER): Payer: Self-pay

## 2017-05-15 ENCOUNTER — Ambulatory Visit: Payer: Medicare Other

## 2017-05-15 ENCOUNTER — Ambulatory Visit (INDEPENDENT_AMBULATORY_CARE_PROVIDER_SITE_OTHER): Payer: Medicare Other | Admitting: Internal Medicine

## 2017-05-15 ENCOUNTER — Encounter (INDEPENDENT_AMBULATORY_CARE_PROVIDER_SITE_OTHER): Payer: Self-pay | Admitting: Internal Medicine

## 2017-05-15 VITALS — BP 131/64 | HR 69 | Temp 97.7°F | Resp 18 | Wt 128.0 lb

## 2017-05-15 DIAGNOSIS — N185 Chronic kidney disease, stage 5: Secondary | ICD-10-CM

## 2017-05-15 DIAGNOSIS — R05 Cough: Secondary | ICD-10-CM

## 2017-05-15 DIAGNOSIS — R110 Nausea: Secondary | ICD-10-CM

## 2017-05-15 DIAGNOSIS — R059 Cough, unspecified: Secondary | ICD-10-CM

## 2017-05-15 DIAGNOSIS — Z992 Dependence on renal dialysis: Secondary | ICD-10-CM

## 2017-05-15 MED ORDER — DARBEPOETIN ALFA 60 MCG/0.3ML IJ SOSY
60.0000 ug | PREFILLED_SYRINGE | Freq: Once | INTRAMUSCULAR | Status: AC
Start: 2017-05-15 — End: 2017-05-15
  Administered 2017-05-15: 13:00:00 60 ug via SUBCUTANEOUS

## 2017-05-15 MED ORDER — ONDANSETRON 4 MG PO TBDP
4.0000 mg | ORAL_TABLET | Freq: Four times a day (QID) | ORAL | 3 refills | Status: DC | PRN
Start: 2017-05-15 — End: 2017-10-04

## 2017-05-15 MED ORDER — DARBEPOETIN ALFA 60 MCG/0.3ML IJ SOSY
PREFILLED_SYRINGE | INTRAMUSCULAR | Status: AC
Start: 2017-05-15 — End: ?
  Filled 2017-05-15: qty 0.3

## 2017-05-15 NOTE — Progress Notes (Signed)
Time: Jacqueline Weber is a 66 y.o. female here for Aranesp injection.  Offers no c/o's.  Hgb is 8.6 and Hct is 27.4 on 05/08/17.  Aranesp 60 mcg given SQ into rua without incident.  Please see MAR, VS for more details.  [id number], pt discharged stable, NAD.  RTC 05/22/17    Shela Commons, RN

## 2017-05-15 NOTE — Progress Notes (Signed)
Subjective:      Date: 05/15/2017 11:40 AM   Patient ID: Jacqueline Weber is a 66 y.o. female.    Chief Complaint:  Chief Complaint   Patient presents with   . Transient Ischemic Attack     Follow  up hospital       Here for follow-up from recent hospitalization.   Inpatient facility: [facility]  Date of admission: August 22  Date of discharge: August 28  Discharge diagnosis: acute renal failure  Hospital course 6 days  Prior hospitalization within past 30 days: no  Prior hospitalization within past 6 months: no  Comorbid conditions: CAD, CHF, CKD and hypertension  Medical procedure(s) performed: hemodialysis  Surgical procedure(s) performed: none  Diagnostic tests performed: chest x-ray and CT chest  Pending tests: none  Home care: needs assistance with ADL's  Specialist follow-up: nephrology  Post-hospital monitoring: daily weights and ambulatory BP monitoring  Mobility status: uses cane and poor mobility     Medication Reconciliation(required):  Hospital discharge medications reviewed and reconciled with current medication list.    Nausea  Needs refill of prn zofan    Anemia  Gets iron infusions every Tuesday    Cough  Has upcoming appt with pulmonary    Cardiologist  Last saw 2 months ago    Problem List:  Patient Active Problem List   Diagnosis   . HTN (hypertension) urgency   . Hyperlipidemia   . History of CVA (cerebrovascular accident)   . Elevated brain natriuretic peptide (BNP) level   . Hyponatremia   . NSTEMI (non-ST elevated myocardial infarction)   . Pre-diabetes   . End stage renal disease   . Acute bronchitis   . Anemia in chronic kidney disease (CODE)   . Iron deficiency anemia, unspecified iron deficiency anemia type   . Chronic renal failure, stage 5       Current Medications:  Current Outpatient Prescriptions   Medication Sig Dispense Refill   . acetaminophen (TYLENOL) 325 MG tablet Take 2 tablets (650 mg total) by mouth every 4 (four) hours as needed for Pain or Fever.     [name]  amLODIPine (NORVASC) 10 MG tablet Take 1 tablet (10 mg total) by mouth daily. 90 tablet 1   . aspirin EC 325 MG EC tablet Take 1 tablet (325 mg total) by mouth daily. 30 tablet 0   . b complex-vitamin c-folic acid (NEPHRO-VITE) 0.8 MG Tab Take 1 tablet by mouth daily.Use over the counter med     . bumetanide (BUMEX) 0.5 MG tablet TAKE ONE TABLET BY MOUTH ONCE DAILY 90 tablet 1   . carvedilol (COREG) 25 MG tablet 12.5 mg 2 (two) times daily with meals.         . Cholecalciferol (VITAMIN D3) 2000 units Tab Take 4,000 IU by mouth daily.     . cloNIDine (CATAPRES) 0.2 MG tablet Take 1 tablet (0.2 mg total) by mouth 2 (two) times daily. 60 tablet 0   . ferrous sulfate 324 (65 FE) MG Tablet Delayed Response TAKE ONE TABLET BY MOUTH IN THE MORNING WITH BREAKFAST 90 tablet 1   . fluticasone-salmeterol (ADVAIR DISKUS) 100-50 MCG/DOSE Aerosol Powder, Breath Activtivatede Inhale 1 puff into the lungs 2 (two) times daily. 1 each 0   . hydrALAZINE (APRESOLINE) 100 MG tablet Take 1 tablet (100 mg total) by mouth 3 (three) times daily. (Patient taking differently: Take 100 mg by mouth 2 (two) times daily as needed.    ) 270 tablet 1   .  lactobacillus/streptococcus (RISAQUAD) Cap Take 1 capsule by mouth daily.Use over the counter probiotic     . ondansetron (ZOFRAN-ODT) 4 MG disintegrating tablet Take 1 tablet (4 mg total) by mouth every 6 (six) hours as needed for Nausea. 20 tablet 3   . pantoprazole (PROTONIX) 40 MG tablet TAKE ONE TABLET BY MOUTH IN THE MORNING BEFORE BREAKFAST 90 tablet 1   . polyethylene glycol (MIRALAX) packet Take 17 g by mouth daily. 30 each 3   . senna-docusate (PERICOLACE) 8.6-50 MG per tablet Take 2 tablets by mouth 2 (two) times daily. 30 tablet 0   . simvastatin (ZOCOR) 20 MG tablet Take 20 mg by mouth nightly.         . traMADol (ULTRAM) 50 MG tablet Take 50 mg by mouth every 6 (six) hours as needed for Pain.       No current facility-administered medications for this visit.         Allergies:  Allergies   Allergen Reactions   . Mosquito (Culex Pipiens) Allergy Skin Test Shortness Of Breath     And  Trouble  breathing   . Shellfish-Derived Products Hives     New  allergy  New  allergy   . Lisinopril Hives     Patient cannot remember the exact nature of the allergy   . Penicillins Rash     Swelling,  numbness       Past Medical History:  Past Medical History:   Diagnosis Date   . Anemia 10/02/2016    H/O ANEMIA - REFLECTED ON CURRENT LABS.   [name] Cataracts, bilateral     H/O CATARACTS - SURGERY DONE - STILL HAS SOME ISSUES.   . Cerebrovascular accident     H/O STROKE - WEAKNESS ON LEFT SIDE. NO NEUROLOGIST.    [name] Chronic renal failure, stage 5 05/08/2017   . Constipation    . Coronary artery disease 06/2016    H/O QUADRUPLE BYPASS SURGERY - FOLLOWED BY [facility].   [name] History of MI (myocardial infarction)    . 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 Hypercholesteremia     CONTROLLED WITH MEDS.   [name] Hypertension     CONTROLLED WITH MEDS.   . Pre-diabetes    . Renal insufficiency     CHRONIC KIDNEY DISEASE - FOLLOWED BY DR. Kate Sable.       Past Surgical History:  Past Surgical History:   Procedure Laterality Date   . CORONARY ARTERY BYPASS N/A 06/12/2016    Procedure: Coronary Artery Bypass x 4.;  Surgeon: Bjorn Loser, MD;  Location: [facility] OR;  Service: Cardiothoracic;  Laterality: N/A;  LIMA to LDA  Vein to PDA  Vein to OM  Vein to Diagonal   . EGD, COLONOSCOPY N/A 10/04/2016   . EGD, COLONOSCOPY N/A 10/04/2016    Procedure: EGD w/ bx, COLONOSCOPY w/ bx, polypectomy;  Surgeon: Norton Blizzard, MD;  Location: [place] [facility] OR;  Service: Gastroenterology;  Laterality: N/A;   . ENDOSCOPIC,VEIN HARVEST N/A 06/12/2016    Procedure: Endoscopic, Left Leg Greater Saphenous Vein Harvest;  Surgeon: Bjorn Loser, MD;  Location: [facility] OR;  Service: Cardiothoracic;  Laterality: N/A;  Left Leg Incision (medially, groin to ankle) @ 0742  Vein out of leg @ 0826  Vein prep complete @ 0845  Total time =  63 minutes       Family History:  Family History   Problem Relation Age of Onset   .  No known problems Mother    . No known problems Father        Social History:  Social History     Social History   . Marital status: Widowed     Spouse name: N/A   . Number of children: N/A   . Years of education: N/A     Occupational History   . Not on file.     Social History Main Topics   . Smoking status: Never Smoker   . Smokeless tobacco: Never Used   . Alcohol use No   . Drug use: No   . Sexual activity: Not on file     Other Topics Concern   . Not on file     Social History Narrative   . No narrative on file       The following sections were reviewed this encounter by the provider:   Tobacco  Allergies  Meds  Problems  Med Hx  Surg Hx  Fam Hx  Soc Hx            Vitals:  BP 131/64 (BP Site: Right arm, Patient Position: Sitting, Cuff Size: Medium)   Pulse 69   Temp 97.7 F (36.5 C) (Oral)   Resp 18   Wt 58.1 kg (128 lb)   SpO2 96%   BMI 25.00 kg/m         ROS:  Review of Systems   Constitutional: Positive for fatigue.   Respiratory: Positive for cough and wheezing. Negative for shortness of breath.    Cardiovascular: Negative for chest pain and palpitations.   Gastrointestinal: Positive for nausea. Negative for abdominal pain, constipation, diarrhea and vomiting.   Musculoskeletal: Negative for arthralgias.   Neurological: Negative for weakness and headaches.         Objective:       Physical Exam:  General Examination:   GENERAL APPEARANCE: alert, in no acute distress, well developed, well nourished, oriented to time, place, and person.   HEAD: normal appearance, atraumatic.   EYES: extraocular movement intact (EOMI), pupils equal, round, reactive to light and accommodation, sclera anicteric, conjunctiva clear.   ORAL CAVITY: normal oropharynx, normal lips, mucosa moist, no lesions.   THROAT: normal appearance, clear, no erythema.   NECK/THYROID: neck supple,  no cervical lymphadenopathy, no neck mass palpated,  no thyromegaly.   LYMPH NODES: no palpable adenopathy.   SKIN: good turgor, no rashes, no suspicious lesions.   HEART: S1, S2 normal, no murmurs, rubs, gallops, regular rate and rhythm.   LUNGS: normal effort / no distress, bilateral coarse rales  MUSCULOSKELETAL: full range of motion, no swelling or deformity.   EXTREMITIES: 2+ edema  NEUROLOGIC: nonfocal, cranial nerves 2-12 grossly intact,  normal strength and tone, sensory exam intact.   PSYCH: cognitive function intact, mood/affect full range, speech clear.      Assessment/Plan:       1. Chronic renal failure, stage 5  Continue with dialysis    2. Nausea  - ondansetron (ZOFRAN-ODT) 4 MG disintegrating tablet; Take 1 tablet (4 mg total) by mouth every 6 (six) hours as needed for Nausea.  Dispense: 20 tablet; Refill: 3    3. Cough  Pt also with rales with hospitalized  Patient's daughter will call today and get an earlier appt  If unable to, will call back here so Illene Regulus can arrange an earlier appt.    Reviewed hospitalization records.    John Giovanni, MD

## 2017-05-17 ENCOUNTER — Encounter (INDEPENDENT_AMBULATORY_CARE_PROVIDER_SITE_OTHER): Payer: Self-pay | Admitting: Specialist

## 2017-05-18 ENCOUNTER — Encounter (INDEPENDENT_AMBULATORY_CARE_PROVIDER_SITE_OTHER): Payer: Self-pay | Admitting: Internal Medicine

## 2017-05-21 MED ORDER — DARBEPOETIN ALFA 60 MCG/0.3ML IJ SOSY
60.0000 ug | PREFILLED_SYRINGE | Freq: Once | INTRAMUSCULAR | Status: AC
Start: 2017-05-22 — End: 2017-05-22
  Administered 2017-05-22: 12:00:00 60 ug via SUBCUTANEOUS

## 2017-05-22 ENCOUNTER — Ambulatory Visit: Payer: Medicare Other

## 2017-05-22 VITALS — BP 147/69 | HR 80 | Temp 98.2°F | Resp 16

## 2017-05-22 DIAGNOSIS — N185 Chronic kidney disease, stage 5: Secondary | ICD-10-CM

## 2017-05-22 MED ORDER — DARBEPOETIN ALFA 60 MCG/0.3ML IJ SOSY
PREFILLED_SYRINGE | INTRAMUSCULAR | Status: AC
Start: 2017-05-22 — End: ?
  Filled 2017-05-22: qty 0.3

## 2017-05-22 NOTE — Progress Notes (Signed)
Jacqueline Weber is a 66 y.o. female here for Aranesp injection.  Offers no c/o's.  Hgb is 8.6 and Hct is 27.6 on 05/08/17.  Aranesp 60 mcg given SQ into right arm without incident.  Please see MAR, VS for more details.  Time 1213, pt discharged stable, NAD.  RTC 05/29/17    Truddie Coco, RN

## 2017-05-28 ENCOUNTER — Encounter (INDEPENDENT_AMBULATORY_CARE_PROVIDER_SITE_OTHER): Payer: Self-pay | Admitting: Internal Medicine

## 2017-05-28 MED ORDER — DARBEPOETIN ALFA 60 MCG/0.3ML IJ SOSY
60.0000 ug | PREFILLED_SYRINGE | Freq: Once | INTRAMUSCULAR | Status: AC
Start: 2017-05-29 — End: 2017-05-29
  Administered 2017-05-29: 12:00:00 60 ug via SUBCUTANEOUS

## 2017-05-29 ENCOUNTER — Ambulatory Visit: Payer: Medicare Other

## 2017-05-29 DIAGNOSIS — N185 Chronic kidney disease, stage 5: Secondary | ICD-10-CM

## 2017-05-29 MED ORDER — DARBEPOETIN ALFA 60 MCG/0.3ML IJ SOSY
PREFILLED_SYRINGE | INTRAMUSCULAR | Status: AC
Start: 2017-05-29 — End: ?
  Filled 2017-05-29: qty 0.3

## 2017-05-29 NOTE — Progress Notes (Signed)
Injection given to right deltoid, tolerated well, Pt to have labs drawn at [facility] next week and report the results via fax to this clinic. RTC one week if injection needed.  If patient continues to have low HGB in 8"s, consider calling doctor for increase in Aranesp dose in reference to [facility].

## 2017-05-31 ENCOUNTER — Ambulatory Visit: Payer: Medicare Other | Admitting: Certified Registered Nurse Anesthetist

## 2017-05-31 ENCOUNTER — Encounter
Admission: RE | Disposition: A | Discharge status: Home / Self Care | Payer: Self-pay | Source: Ambulatory Visit | Attending: Specialist

## 2017-05-31 ENCOUNTER — Ambulatory Visit
Admission: RE | Admit: 2017-05-31 | Discharge: 2017-05-31 | Disposition: A | Discharge status: Home / Self Care | Payer: Medicare Other | Source: Ambulatory Visit | Attending: Specialist | Admitting: Specialist

## 2017-05-31 DIAGNOSIS — N185 Chronic kidney disease, stage 5: Secondary | ICD-10-CM

## 2017-05-31 DIAGNOSIS — N186 End stage renal disease: Secondary | ICD-10-CM | POA: Insufficient documentation

## 2017-05-31 DIAGNOSIS — Z992 Dependence on renal dialysis: Secondary | ICD-10-CM | POA: Insufficient documentation

## 2017-05-31 DIAGNOSIS — I120 Hypertensive chronic kidney disease with stage 5 chronic kidney disease or end stage renal disease: Secondary | ICD-10-CM | POA: Insufficient documentation

## 2017-05-31 DIAGNOSIS — E7800 Pure hypercholesterolemia, unspecified: Secondary | ICD-10-CM | POA: Insufficient documentation

## 2017-05-31 DIAGNOSIS — Z880 Allergy status to penicillin: Secondary | ICD-10-CM | POA: Insufficient documentation

## 2017-05-31 HISTORY — PX: FORMATION, ARTERIOVENOUS FISTULA, UPPER EXTREMITY: SHX4128

## 2017-05-31 LAB — GLUCOSE WHOLE BLOOD - POCT: Whole Blood Glucose POCT: 236 mg/dL — ABNORMAL HIGH (ref 70–100)

## 2017-05-31 SURGERY — FORMATION, ARTERIOVENOUS FISTULA, UPPER EXTREMITY
Anesthesia: Anesthesia General | Site: Arm Lower | Laterality: Left | Wound class: Clean

## 2017-05-31 MED ORDER — FENTANYL CITRATE (PF) 50 MCG/ML IJ SOLN (WRAP)
INTRAMUSCULAR | Status: DC | PRN
Start: 2017-05-31 — End: 2017-05-31
  Administered 2017-05-31 (×2): 25 ug via INTRAVENOUS

## 2017-05-31 MED ORDER — HEPARIN SODIUM (PORCINE) 5000 UNIT/ML IJ SOLN
INTRAMUSCULAR | Status: AC
Start: 2017-05-31 — End: ?
  Filled 2017-05-31: qty 2

## 2017-05-31 MED ORDER — ACETAMINOPHEN-CODEINE 300-30 MG PO TABS
1.0000 | ORAL_TABLET | ORAL | 0 refills | Status: DC | PRN
Start: 2017-05-31 — End: 2017-06-15

## 2017-05-31 MED ORDER — HEPARIN SODIUM (PORCINE) 5000 UNIT/ML IJ SOLN
INTRAMUSCULAR | Status: DC | PRN
Start: 2017-05-31 — End: 2017-05-31
  Administered 2017-05-31: 11:00:00 500 mL

## 2017-05-31 MED ORDER — BUPIVACAINE-EPINEPHRINE (PF) 0.5% -1:200000 IJ SOLN
INTRAMUSCULAR | Status: AC
Start: 2017-05-31 — End: ?
  Filled 2017-05-31: qty 30

## 2017-05-31 MED ORDER — HEPARIN SODIUM (PORCINE) 1000 UNIT/ML IJ SOLN
INTRAMUSCULAR | Status: DC | PRN
Start: 2017-05-31 — End: 2017-05-31
  Administered 2017-05-31: 2500 [IU] via INTRAVENOUS

## 2017-05-31 MED ORDER — LACTATED RINGERS IV SOLN
INTRAVENOUS | Status: DC
Start: 2017-05-31 — End: 2017-05-31

## 2017-05-31 MED ORDER — GELATIN ABSORBABLE 12-7 MM EX MISC
CUTANEOUS | Status: AC
Start: 2017-05-31 — End: ?
  Filled 2017-05-31: qty 1

## 2017-05-31 MED ORDER — LIDOCAINE-EPINEPHRINE 1 %-1:100000 IJ SOLN
INTRAMUSCULAR | Status: DC | PRN
Start: 2017-05-31 — End: 2017-05-31
  Administered 2017-05-31: 2.5 mL

## 2017-05-31 MED ORDER — HEPARIN SODIUM (PORCINE) 5000 UNIT/ML IJ SOLN
INTRAMUSCULAR | Status: AC
Start: 2017-05-31 — End: ?
  Filled 2017-05-31: qty 1

## 2017-05-31 MED ORDER — PROPOFOL 10 MG/ML IV EMUL (WRAP)
INTRAVENOUS | Status: DC | PRN
Start: 2017-05-31 — End: 2017-05-31
  Administered 2017-05-31: 50 ug/kg/min via INTRAVENOUS

## 2017-05-31 MED ORDER — BUPIVACAINE-EPINEPHRINE (PF) 0.5% -1:200000 IJ SOLN
INTRAMUSCULAR | Status: DC | PRN
Start: 2017-05-31 — End: 2017-05-31
  Administered 2017-05-31: 2.5 mL via INTRAMUSCULAR

## 2017-05-31 MED ORDER — LIDOCAINE-EPINEPHRINE 1 %-1:100000 IJ SOLN
INTRAMUSCULAR | Status: AC
Start: 2017-05-31 — End: ?
  Filled 2017-05-31: qty 20

## 2017-05-31 MED ORDER — SODIUM CHLORIDE 0.9 % IV SOLN
INTRAVENOUS | Status: DC
Start: 2017-05-31 — End: 2017-05-31

## 2017-05-31 MED ORDER — MIDAZOLAM HCL 2 MG/2ML IJ SOLN
INTRAMUSCULAR | Status: AC
Start: 2017-05-31 — End: ?
  Filled 2017-05-31: qty 2

## 2017-05-31 MED ORDER — MIDAZOLAM HCL 2 MG/2ML IJ SOLN
INTRAMUSCULAR | Status: DC | PRN
Start: 2017-05-31 — End: 2017-05-31
  Administered 2017-05-31: 1 mg via INTRAVENOUS

## 2017-05-31 MED ORDER — FENTANYL CITRATE (PF) 50 MCG/ML IJ SOLN (WRAP)
INTRAMUSCULAR | Status: AC
Start: 2017-05-31 — End: ?
  Filled 2017-05-31: qty 2

## 2017-05-31 SURGICAL SUPPLY — 23 items
BANDAGE KERLIX MEDIUM GAUZE L3.6 YD X (Dressing) IMPLANT
CANISTER MDIVAC CRD LINERW LI (Suction) ×1
CLIP INTERNAL SMALL CHEVRON 6 CARTRIDGE (Clips) ×1
CLIP INTERNAL SMALL CHEVRON 6 CARTRIDGE LIGATE TRIANGULATE CROSS (Clips) IMPLANT
CLIP TITANIUM LIGATING SMALL (Clips) ×1
COVER FLEXIBLE LIGHT HANDLE PLASTIC GREEN (Procedure Accessories) ×1 IMPLANT
COVER FLEXIBLE MEDLINE LIGHT HANDLE (Procedure Accessories) ×1
CVR LGHTHNDL FLEX SFT 1PK (Procedure Accessories) ×1
ELECTRODE ADULT PATIENT RETURN L9 FT REM POLYHESIVE ACRYLIC FOAM (Procedure Accessories) ×1 IMPLANT
ELECTRODE PATIENT RETURN L9 FT VALLEYLAB (Procedure Accessories) ×1
GAUZE KRLX STRL 3.4INX3.6YRD (Dressing)
GLOVE SURG LTX SZ 8 STRL (Glove) ×2 IMPLANT
KIT INFECTION CONTROL CUSTOM (Kits) ×2
KIT INFECTION CONTROL CUSTOM IFOH03 (Kits) ×1 IMPLANT
LINER SUCTION 1500 CC LOCK LID SHUTOFF (Suction) ×1 IMPLANT
MANIFOLD NEPTUNE II 4PORT SUCT (Filter) ×1
MANIFOLD SUCTION 2 STANDARD 4 PORT (Filter) ×1
MANIFOLD SUCTION 2 STANDARD 4 PORT NEPTUNE 2 WASTE MANAGEMENT SYSTEM (Filter) ×1 IMPLANT
PACK AV ~~LOC~~ (Pack) ×2 IMPLANT
PAD ELECTROSRG GRND REM W CRD (Procedure Accessories) ×1
SUTURE PDS II 7-0 BV-1 L30 IN 2 ARM (Suture) ×1
SUTURE PDS II 7-0 BV-1 L30 IN 2 ARM MONOFILAMENT VIOLET ABSORBABLE (Suture) ×1 IMPLANT
SUTURE PDS II 7-0 BV1 30IN (Suture) ×1

## 2017-05-31 NOTE — Anesthesia Preprocedure Evaluation (Signed)
Anesthesia Evaluation    AIRWAY    Mallampati: III    TM distance: >3 FB  Neck ROM: full  Mouth Opening:full   CARDIOVASCULAR    cardiovascular exam normal       DENTAL    no notable dental hx     PULMONARY    rhonchi, LLF, rales and LLF     OTHER FINDINGS    Pt id and h&P reviewed.          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 She said they were supposed to go check with allergist and she also mentioned that a  a lot is going on now and that she will try to f/u with the pulmonologist. Anesthesia plan explained. Risks and benefits explained.Patient agrees with plan.)      intravenous induction   Detailed anesthesia plan: general IV  Monitors/Adjuncts: other    Post Op: other  Post op pain management: per surgeon    informed consent obtained    Plan discussed with CRNA.                   Signed byOrlie Pollen 05/31/17 10:07 AM

## 2017-05-31 NOTE — Anesthesia Postprocedure Evaluation (Signed)
Anesthesia Post Evaluation    Patient: Jacqueline Weber    Procedures performed: Procedure(s) with comments:  FORMATION, UPPER EXTREMITY, A-V FISTULA - LEFT ARM A-V FISTULA      Anesthesia type: General TIVA    Patient location:Phase II PACU    Last vitals:   Vitals:    05/31/17 1220   BP: 137/66   Pulse: 68   Resp: 15   Temp:    SpO2: 98%       Post pain: Patient not complaining of pain, continue current therapy      Mental Status:awake    Respiratory Function: tolerating room air    Cardiovascular: stable    Nausea/Vomiting: patient not complaining of nausea or vomiting    Hydration Status: adequate    Post assessment: no apparent anesthetic complications    Signed by: Orlie Pollen, 05/31/2017 1:16 PM

## 2017-05-31 NOTE — Op Note (Signed)
FULL OPERATIVE NOTE    Date Time: 05/31/17 11:56 AM  Patient Name: Jacqueline Weber  Attending Physician: Cherene Altes, MD      Date of Operation:   05/31/2017    Providers Performing:   Surgeon(s):  Cherene Altes, MD    Circulator: Horton Finer, RN  Scrub Person: Brayton Caves  First Assistant: Laverle Patter  Preceptor: Drexel Iha, RN    Operative Procedure:   Procedure(s):  FORMATION, UPPER EXTREMITY, A-V FISTULA    Preoperative Diagnosis:   Pre-Op Diagnosis Codes:     * ESRD (end stage renal disease) [N18.6]    Postoperative Diagnosis:   Post-Op Diagnosis Codes:     * ESRD (end stage renal disease) [N18.6]    Indications:   Patient has chronic renal failure on hemodialysis and is in need of long-term dialysis access.  Patient is right-handed.  The intraoperative ultrasound revealed marginal but patent left upper arm cephalic vein.  Therefore, decision was made to proceed with a two-stage cephalic vein transposition.  The risks and benefits of the procedures were explained to the patient and agreed. The risk of infection, bleeding, thrombosis, steal syndrome, nerve injury and other complications were explained in detail and agreed.       Operative Notes:   Patient was brought to the operating suite, was placed in supine position.  The Left arm was ultrasounded marked, prepped and draped in sterile fashion.  A small incision was made in the antecubital area using knife.  After 1 percent lidocaine with Marcaine and epinephrine was injected.  Then sharp dissection carried out and the cubital cephalic vein was dissected out.  The vein measured 2 millimeter.  The vein was dissected out and distally clipped and ligated and was divided.  The vein was dilated.  Then the brachial artery was dissected out through the same incision.  The artery was encircled with a vessel loop.  The artery measured 4 millimeter in diameter.  Vascular clamps were applied and a longitudinal arteriotomy was made.   The length of the arteriotomy was approximately 5 mm.  Then an end to side anastomosis was created between the cubital cephalic vein and the brachial artery using 7-0 PDS in standard fashion.  The flow was stored.  There was reasonable flow present.  There was no evidence of bleeding.  The wound was closed using 3-0 Monocryl in a running fashion and skin was closed using 4-0 Monocryl in subcuticular fashion.  Steri-Strips applied.  Dressings were applied.  Patient tolerated the procedure well, was transferred to the recovery room.    Estimated Blood Loss:   * No values recorded between 05/31/2017 11:21 AM and 05/31/2017 11:56 AM *    Implants:   * No implants in log *    Drains:       Specimens:       Complications:       Signed by: Cherene Altes, MD

## 2017-05-31 NOTE — Discharge Instructions (Signed)
Anesthesia: After Your Surgery  You've just had surgery. During surgery, you received medication called anesthesia to keep you comfortable and pain-free. After surgery, you may experience some pain or nausea. This is normal. Here are some tips for feeling better and recovering after surgery.     Stay on schedule with your medication.   Going Home  Your doctor or nurse will show you how to take care of yourself when you go home. He or she will also answer your questions. Have an adult family member or friend drive you home. For the first 24 hours after your surgery:   Do not drive or use heavy equipment.   Do not make important decisions or sign documents.   Avoid alcohol.   Have someone stay with you, if possible. They can watch for problems and help keep you safe.  Be sure to keep all follow-up doctor's appointments. And rest after your procedure for as long as your doctor tells you to.    Coping with Pain  If you have pain after surgery, pain medication will help you feel better. Take it as directed, before pain becomes severe. Also, ask your doctor or pharmacist about other ways to control pain, such as with heat, ice, and relaxation. And follow any other instructions your surgeon or nurse gives you.    Tips for Taking Pain Medication  To get the best relief possible, remember these points:   Pain medications can upset your stomach. Taking them with a little food may help.   Most pain relievers taken by mouth need at least 20 to 30 minutes to take effect.   Taking medication on a schedule can help you remember to take it. Try to time your medication so that you can take it before beginning an activity, such as dressing, walking, or sitting down for dinner.   Constipation is a common side effect of pain medications. Drink lots of fluids. Eating fruit and vegetables can also help. Don't take laxatives unless your surgeon has prescribed them.   Mixing alcohol and pain medication can cause dizziness and  slow your breathing. It can even be fatal. Don't drink alcohol while taking pain medication.   Pain medication can slow your reflexes. Don't drive or operate machinery while taking pain medication,    Managing Nausea  Some people have an upset stomach after surgery. This is often due to anesthesia, pain, pain medications, or the stress of surgery. The following tips will help you manage nausea and get good nutrition as you recover. If you were on a special diet before surgery, ask your doctor if you should follow it during recovery. These tips may help:   Don't push yourself to eat. Your body will tell you what to eat and when.   Start off with liquids and soup. They are easier to digest.   Progress to semisolids (mashed potatoes, applesauce, and gelatin) as you feel ready.   Slowly move to solid foods. Don't eat fatty, rich, or spicy foods at first.   Don't force yourself to have three large meals a day. Instead, eat smaller amounts more often.   Take pain medications with a small amount of solid food, such as crackers or toast.    Important:   Prevent blood clots by wiggling your toes and tightening your calf muscle 20 times every hour while awake until you resume normal walking activity.   If unable to urinate in 6-8 hours, call your surgeon or go to the Emergency Department.       Call Your Surgeon If.   You still have pain an hour after taking medication (it may not be strong enough).   You feel too sleepy, dizzy, or groggy (medication may be too strong).   You have side effects like nausea, vomiting, or skin changes (rash, itching, or hives).   Signs of infection - fever over 101, chills. Incision site redness, warmth, swelling, foul odor or purulent drainage.   Persistent bleeding at the operative site.    2000-2011 [employer], [address], [place] [address]. All rights reserved. This information is not intended as a substitute for professional medical care. Always follow your  healthcare professional's instructions.

## 2017-05-31 NOTE — H&P (Signed)
History and Physical currently available, in Epic or on paper, has been reviewed, and there are no major changes. Pt. seen and examined by me prior to procedure.     Rielynn Trulson A Emmons Toth, MD

## 2017-05-31 NOTE — Transfer of Care (Signed)
Anesthesia Transfer of Care Note    Patient: Jacqueline Weber    Procedures performed: Procedure(s) with comments:  FORMATION, UPPER EXTREMITY, A-V FISTULA - LEFT ARM A-V FISTULA      Anesthesia type: General TIVA    Patient location:Phase II PACU    Last vitals:   Vitals:    05/31/17 0959   BP: 190/86   Pulse: 70   Resp: 16   Temp: 36.7 C (98.1 F)   SpO2: 96%       Post pain: Patient not complaining of pain, continue current therapy      Mental Status:awake    Respiratory Function: tolerating room air    Cardiovascular: stable    Nausea/Vomiting: patient not complaining of nausea or vomiting    Hydration Status: adequate    Post assessment: no apparent anesthetic complications    Signed by: Allene Dillon  05/31/17

## 2017-05-31 NOTE — Discharge Instr - AVS First Page (Signed)
ACCESS DISCHARGE INSTRUCTIONS    Surgeon: Rebeckah Masih MD    Wound Care:  If there is an ACE please remove in AM.  Remove dressing in 48 hours and wet incision site  Remove steri strips in 5 days        Pain Control:  Some pain and swelling is normal after surgery.  You may take the pain medication as prescribed.        Driving:  When pain is controlled and not taking pain medications      Diet:   You may not feel hungry all the time or you may eat a small amount and feel full.  This is ok.  Some people find eating multiple small meals is easier than 3 large meals.  Continue to drink lots of clear fluids.      Reasons to call the doctor:  Call your doctor or go to emergency room if you have any of the following:   Fever greater than 101.5 F   Pus draining from your wound   Redness around your wound that is spreading   You may not drive or do anything requiring coordination or balance for 24 hours.     Avoid heavy lifting for 1 weeks after any surgery.    Persistent bleeding, swelling or pus at the operative site.   Loss of feeling, severe pain, inability to move fingers or cold hand.    Follow up Care:  Call the office [phone] and make an appointment  To have a duplex US of the fistula in my office in 4 weeks and see me.

## 2017-06-01 ENCOUNTER — Encounter: Payer: Self-pay | Admitting: Specialist

## 2017-06-04 MED ORDER — DARBEPOETIN ALFA 60 MCG/0.3ML IJ SOSY
60.0000 ug | PREFILLED_SYRINGE | Freq: Once | INTRAMUSCULAR | Status: DC
Start: 2017-06-05 — End: 2017-06-05

## 2017-06-05 ENCOUNTER — Ambulatory Visit: Payer: Medicare Other

## 2017-06-05 ENCOUNTER — Ambulatory Visit
Admission: RE | Admit: 2017-06-05 | Discharge: 2017-06-05 | Disposition: A | Discharge status: Home / Self Care | Payer: Medicare Other | Source: Ambulatory Visit | Attending: Nephrology | Admitting: Nephrology

## 2017-06-05 VITALS — BP 141/62 | HR 74 | Temp 97.7°F | Resp 16

## 2017-06-05 DIAGNOSIS — N185 Chronic kidney disease, stage 5: Secondary | ICD-10-CM

## 2017-06-05 DIAGNOSIS — D631 Anemia in chronic kidney disease: Secondary | ICD-10-CM | POA: Insufficient documentation

## 2017-06-05 LAB — RENAL FUNCTION PANEL
Albumin: 2.7 g/dL — ABNORMAL LOW (ref 3.5–5.0)
Anion Gap: 9 (ref 5.0–15.0)
BUN: 49.3 mg/dL — ABNORMAL HIGH (ref 7.0–19.0)
CO2: 23 mEq/L (ref 22–29)
Calcium: 8.7 mg/dL (ref 8.5–10.5)
Chloride: 99 mEq/L — ABNORMAL LOW (ref 100–111)
Creatinine: 5.2 mg/dL — ABNORMAL HIGH (ref 0.6–1.0)
Glucose: 574 mg/dL (ref 70–100)
Phosphorus: 4.7 mg/dL (ref 2.3–4.7)
Potassium: 4.8 mEq/L (ref 3.5–5.1)
Sodium: 131 mEq/L — ABNORMAL LOW (ref 136–145)

## 2017-06-05 LAB — CBC AND DIFFERENTIAL
Absolute NRBC: 0 10*3/uL
Basophils Absolute Automated: 0.05 10*3/uL (ref 0.00–0.20)
Basophils Automated: 0.7 %
Eosinophils Absolute Automated: 0.14 10*3/uL (ref 0.00–0.70)
Eosinophils Automated: 1.9 %
Hematocrit: 36.5 % — ABNORMAL LOW (ref 37.0–47.0)
Hgb: 11.2 g/dL — ABNORMAL LOW (ref 12.0–16.0)
Immature Granulocytes Absolute: 0.02 10*3/uL
Immature Granulocytes: 0.3 %
Lymphocytes Absolute Automated: 1.55 10*3/uL (ref 0.50–4.40)
Lymphocytes Automated: 20.7 %
MCH: 21.7 pg — ABNORMAL LOW (ref 28.0–32.0)
MCHC: 30.7 g/dL — ABNORMAL LOW (ref 32.0–36.0)
MCV: 70.9 fL — ABNORMAL LOW (ref 80.0–100.0)
MPV: 10.7 fL (ref 9.4–12.3)
Monocytes Absolute Automated: 0.51 10*3/uL (ref 0.00–1.20)
Monocytes: 6.8 %
Neutrophils Absolute: 5.23 10*3/uL (ref 1.80–8.10)
Neutrophils: 69.6 %
Nucleated RBC: 0 /100 WBC (ref 0.0–1.0)
Platelets: 315 10*3/uL (ref 140–400)
RBC: 5.15 10*6/uL (ref 4.20–5.40)
RDW: 17 % — ABNORMAL HIGH (ref 12–15)
WBC: 7.5 10*3/uL (ref 3.50–10.80)

## 2017-06-05 LAB — IRON PROFILE
Iron Saturation: 17 % (ref 15–50)
Iron: 36 ug/dL — ABNORMAL LOW (ref 40–145)
TIBC: 206 ug/dL — ABNORMAL LOW (ref 265–497)
UIBC: 170 ug/dL (ref 126–382)

## 2017-06-05 LAB — GFR: EGFR: 8.3

## 2017-06-05 LAB — FERRITIN: Ferritin: 191.03 ng/mL (ref 4.60–204.00)

## 2017-06-05 LAB — HEMOLYSIS INDEX: Hemolysis Index: 20 — ABNORMAL HIGH (ref 0–18)

## 2017-06-05 NOTE — Progress Notes (Signed)
Jacqueline Weber is a 66 y.o. female here for Aranesp injection and was held due to parameters for cbc.  Offers no c/o's.  Hgb is 11.2 and Hct is 36.5 on 06/05/17.  Aranesp 60 mcg given SQ held.  Please see MAR, VS for more details.  Pt discharged stable, NAD.  RTC 06/05/17    Truddie Coco, RN

## 2017-06-05 NOTE — Progress Notes (Signed)
RTC 07/03/17 not 06/05/17.

## 2017-06-12 ENCOUNTER — Encounter (INDEPENDENT_AMBULATORY_CARE_PROVIDER_SITE_OTHER): Payer: Self-pay

## 2017-06-15 ENCOUNTER — Ambulatory Visit (INDEPENDENT_AMBULATORY_CARE_PROVIDER_SITE_OTHER): Payer: Medicare Other | Admitting: Internal Medicine

## 2017-06-15 ENCOUNTER — Encounter (INDEPENDENT_AMBULATORY_CARE_PROVIDER_SITE_OTHER): Payer: Self-pay | Admitting: Internal Medicine

## 2017-06-15 VITALS — BP 145/71 | HR 67 | Temp 98.0°F | Ht <= 58 in | Wt 124.0 lb

## 2017-06-15 DIAGNOSIS — N186 End stage renal disease: Secondary | ICD-10-CM

## 2017-06-15 DIAGNOSIS — E1122 Type 2 diabetes mellitus with diabetic chronic kidney disease: Secondary | ICD-10-CM

## 2017-06-15 DIAGNOSIS — Z992 Dependence on renal dialysis: Secondary | ICD-10-CM

## 2017-06-15 DIAGNOSIS — E119 Type 2 diabetes mellitus without complications: Secondary | ICD-10-CM | POA: Insufficient documentation

## 2017-06-15 DIAGNOSIS — Z23 Encounter for immunization: Secondary | ICD-10-CM

## 2017-06-15 DIAGNOSIS — Z794 Long term (current) use of insulin: Secondary | ICD-10-CM | POA: Insufficient documentation

## 2017-06-15 LAB — POCT HEMOGLOBIN A1C: POCT Hgb A1C: 9 — AB (ref 3.9–5.9)

## 2017-06-15 MED ORDER — SITAGLIPTIN PHOSPHATE 25 MG PO TABS
25.0000 mg | ORAL_TABLET | Freq: Every day | ORAL | 1 refills | Status: DC
Start: 2017-06-15 — End: 2017-08-22

## 2017-06-15 NOTE — Progress Notes (Signed)
Subjective:      Date: 06/15/2017 10:25 AM   Patient ID: Jacqueline Weber is a 66 y.o. female.    Chief Complaint:  Chief Complaint   Patient presents with   . Diabetes       HPI:  Diabetes   She presents for her follow-up diabetic visit. She has type 2 diabetes mellitus. Hypoglycemia symptoms include hunger and nervousness/anxiousness. Pertinent negatives for hypoglycemia include no mood changes, pallor, sleepiness, speech difficulty or tremors. Associated symptoms include fatigue and weight loss. Pertinent negatives for diabetes include no blurred vision, no chest pain, no foot paresthesias, no foot ulcerations, no polydipsia, no polyphagia, no polyuria, no visual change and no weakness. Pertinent negatives for hypoglycemia complications include no blackouts, no hospitalization, no nocturnal hypoglycemia, no required assistance and no required glucagon injection. Symptoms are worsening. Risk factors for coronary artery disease include diabetes mellitus, hypertension, dyslipidemia and family history. Her breakfast blood glucose range is generally >200 mg/dl.   pt had been on [place] but HgbA1c had decreased to 5 earlier this year.  Januvia d/c'ed  Blood sugars have been fluctuating, but now have been running high  Pt has started dialysis  Cough- pt is seeing pulmonologist    Problem List:  Patient Active Problem List   Diagnosis   . HTN (hypertension) urgency   . Hyperlipidemia   . History of CVA (cerebrovascular accident)   . Elevated brain natriuretic peptide (BNP) level   . Hyponatremia   . NSTEMI (non-ST elevated myocardial infarction)   . End stage renal disease   . Acute bronchitis   . Anemia in chronic kidney disease (CODE)   . Iron deficiency anemia, unspecified iron deficiency anemia type   . Chronic renal failure, stage 5   . Diabetes mellitus       Current Medications:  Current Outpatient Prescriptions   Medication Sig Dispense Refill   . acetaminophen (TYLENOL) 325 MG tablet Take 2 tablets (650 mg  total) by mouth every 4 (four) hours as needed for Pain or Fever.     [name] amLODIPine (NORVASC) 10 MG tablet Take 1 tablet (10 mg total) by mouth daily. 90 tablet 1   . aspirin EC 325 MG EC tablet Take 1 tablet (325 mg total) by mouth daily. 30 tablet 0   . bumetanide (BUMEX) 0.5 MG tablet TAKE ONE TABLET BY MOUTH ONCE DAILY 90 tablet 1   . carvedilol (COREG) 25 MG tablet 12.5 mg 2 (two) times daily with meals.         . Cholecalciferol (VITAMIN D3) 2000 units Tab Take 4,000 IU by mouth daily.     . cloNIDine (CATAPRES) 0.2 MG tablet Take 1 tablet (0.2 mg total) by mouth 2 (two) times daily. 60 tablet 0   . ferrous sulfate 324 (65 FE) MG Tablet Delayed Response TAKE ONE TABLET BY MOUTH IN THE MORNING WITH BREAKFAST 90 tablet 1   . fluticasone-salmeterol (ADVAIR DISKUS) 100-50 MCG/DOSE Aerosol Powder, Breath Activtivatede Inhale 1 puff into the lungs 2 (two) times daily. 1 each 0   . lactobacillus/streptococcus (RISAQUAD) Cap Take 1 capsule by mouth daily.Use over the counter probiotic     . ondansetron (ZOFRAN-ODT) 4 MG disintegrating tablet Take 1 tablet (4 mg total) by mouth every 6 (six) hours as needed for Nausea. 20 tablet 3   . pantoprazole (PROTONIX) 40 MG tablet TAKE ONE TABLET BY MOUTH IN THE MORNING BEFORE BREAKFAST 90 tablet 1   . senna-docusate (PERICOLACE) 8.6-50 MG per tablet Take 2  tablets by mouth 2 (two) times daily. 30 tablet 0   . simvastatin (ZOCOR) 20 MG tablet Take 20 mg by mouth nightly.         [name] SITagliptin (JANUVIA) 25 MG tablet Take 1 tablet (25 mg total) by mouth daily. 30 tablet 1   . traMADol (ULTRAM) 50 MG tablet Take 50 mg by mouth every 6 (six) hours as needed for Pain.       No current facility-administered medications for this visit.        Allergies:  Allergies   Allergen Reactions   . Mosquito (Culex Pipiens) Allergy Skin Test Shortness Of Breath     And  Trouble  breathing   . Shellfish-Derived Products Hives     New  allergy  New  allergy   . Lisinopril Hives     Patient cannot  remember the exact nature of the allergy   . Penicillins Rash     Swelling,  numbness       Past Medical History:  Past Medical History:   Diagnosis Date   . Anemia 10/02/2016    H/O ANEMIA - REFLECTED ON CURRENT LABS.   [name] Cataracts, bilateral     H/O CATARACTS - SURGERY DONE - STILL HAS SOME ISSUES.   . Cerebrovascular accident     H/O STROKE - WEAKNESS ON LEFT SIDE. NO NEUROLOGIST.    [name] Chronic renal failure, stage 5 05/08/2017   . Constipation    . Coronary artery disease 06/2016    H/O QUADRUPLE BYPASS SURGERY - FOLLOWED BY [facility].   . Diabetes mellitus    . History of MI (myocardial infarction)    . 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 Hypercholesteremia     CONTROLLED WITH MEDS.   [name] Hypertension     CONTROLLED WITH MEDS.   . Pre-diabetes    . Renal insufficiency     CHRONIC KIDNEY DISEASE - FOLLOWED BY DR. Kate Sable.       Past Surgical History:  Past Surgical History:   Procedure Laterality Date   . CORONARY ARTERY BYPASS N/A 06/12/2016    Procedure: Coronary Artery Bypass x 4.;  Surgeon: Bjorn Loser, MD;  Location: [facility] OR;  Service: Cardiothoracic;  Laterality: N/A;  LIMA to LDA  Vein to PDA  Vein to OM  Vein to Diagonal   . EGD, COLONOSCOPY N/A 10/04/2016   . EGD, COLONOSCOPY N/A 10/04/2016    Procedure: EGD w/ bx, COLONOSCOPY w/ bx, polypectomy;  Surgeon: Norton Blizzard, MD;  Location: [facility] OR;  Service: Gastroenterology;  Laterality: N/A;   . ENDOSCOPIC,VEIN HARVEST N/A 06/12/2016    Procedure: Endoscopic, Left Leg Greater Saphenous Vein Harvest;  Surgeon: Bjorn Loser, MD;  Location: [facility] HEART OR;  Service: Cardiothoracic;  Laterality: N/A;  Left Leg Incision (medially, groin to ankle) @ 0742  Vein out of leg @ 0826  Vein prep complete @ 0845  Total time = 63 minutes   . FORMATION, UPPER EXTREMITY, A-V FISTULA Left 05/31/2017    Procedure: FORMATION, UPPER EXTREMITY, A-V FISTULA;  Surgeon: Cherene Altes, MD;  Location: [facility];  Service: Vascular;  Laterality: Left;   LEFT ARM A-V FISTULA         Family History:  Family History   Problem Relation Age of Onset   . No known problems Mother    . No known problems Father  Social History:  Social History     Social History   . Marital status: Widowed     Spouse name: N/A   . Number of children: N/A   . Years of education: N/A     Occupational History   . Not on file.     Social History Main Topics   . Smoking status: Never Smoker   . Smokeless tobacco: Never Used   . Alcohol use No   . Drug use: No   . Sexual activity: No     Other Topics Concern   . Not on file     Social History Narrative   . No narrative on file       The following sections were reviewed this encounter by the provider:   Allergies  Meds  Problems         Vitals:  BP 145/71 (BP Site: Right arm, Patient Position: Sitting, Cuff Size: Medium)   Pulse 67   Temp 98 F (36.7 C) (Oral)   Ht 1.473 m (4\' 10" )   Wt 56.2 kg (124 lb)   SpO2 97%   BMI 25.92 kg/m          ROS:  Review of Systems   Constitutional: Positive for fatigue and weight loss.   Eyes: Negative for blurred vision.   Respiratory: Positive for cough. Negative for shortness of breath and wheezing.    Cardiovascular: Negative for chest pain and palpitations.   Gastrointestinal: Negative for abdominal pain, diarrhea, nausea and vomiting.   Endocrine: Negative for polydipsia, polyphagia and polyuria.   Skin: Negative for pallor.   Neurological: Negative for tremors, speech difficulty and weakness.   Psychiatric/Behavioral: The patient is nervous/anxious.          Objective:       Physical Exam:  General Examination:   GENERAL APPEARANCE: alert, in no acute distress, well developed, well nourished, oriented to time, place, and person.   HEAD: normal appearance, atraumatic.   EYES: extraocular movement intact (EOMI), pupils equal, round, reactive to light and accommodation, sclera anicteric, conjunctiva clear. .   ORAL CAVITY: normal oropharynx, normal lips, mucosa moist, no lesions.   THROAT:  normal appearance, clear, no erythema.   NECK/THYROID: neck supple,  no cervical lymphadenopathy, no neck mass palpated, no thyromegaly.   LYMPH NODES: no palpable adenopathy.   SKIN: good turgor, no rashes, no suspicious lesions.   HEART: S1, S2 normal, no murmurs, rubs, gallops, regular rate and rhythm.   LUNGS: normal effort / no distress, normal breath sounds, clear to auscultation bilaterally, no wheezes, rales, rhonchi.    MUSCULOSKELETAL: full range of motion, no swelling or deformity.   EXTREMITIES: no edema,  NEUROLOGIC: nonfocal, cranial nerves 2-12 grossly intact, normal strength and tone, sensory exam intact.   PSYCH: cognitive function intact, mood/affect full range, speech clear.      Assessment/Plan:       1. Type 2 diabetes mellitus with chronic kidney disease on chronic dialysis, without long-term current use of insulin  - POCT Hemoglobin A1C  - SITagliptin (JANUVIA) 25 MG tablet; Take 1 tablet (25 mg total) by mouth daily.  Dispense: 30 tablet; Refill: 1    2. Need for vaccination  - Flu Vaccine HIGH DOSE, 65 yrs +  DM not controlled, start [place]  F/u 4 weeks        John Giovanni, MD

## 2017-06-20 ENCOUNTER — Telehealth: Payer: Medicare Other

## 2017-06-20 NOTE — Pre-Procedure Instructions (Signed)
SPECIAL INSTRUCTIONS    TRANSPORTATION     TESTING - DAY OF SURGERY FSBS, BMP, CBC, PT/TPTT   PREGNANCY TEST WAIVERS    ABNORMAL LABS    SPECIAL CONSIDERATIONS-BLOOD PRODUCTS    SPECIAL NEEDS FOR PATIENT - BEHAVIORAL SPEAKS [place]- DAUGHTER WILL TRANLATE   PEDIATRIC OSA QUESTIONS    ANY SPECIAL CONSIDERATIONS-PEDS    CHILD LIFE SPECIALIST YES OR NO        Date of Procedure 06/21/17.    Arrival Time is 2 hours prior to surgical time which is currently scheduled for 9:30 AM- ARRIVE 7:30 AM.    Eating and Drinking Instructions for day of surgery-  If time changes -Nothing By Mouth 8 hours prior to arrival time except clear liquids 4 hours prior to arrival time.  NO FOOD AFTER 11:30 PM 06/20/17, CLEAR LIQUIDS UNTIL 3:30 AM, THEN NOTHING BY MOUTH EXCEPT MEDS WITH SIPS WATER.     CLEAR LIQUIDS INCLUDE:WATER, BLACK COFFEE/TEA (NO MILK, CREAM, NON-DAIRY CREAMER OR SUGAR), CARBONATED BEVERAGES, JUICES WITHOUT PULP (APPLE, CRANBERRY, GRAPE), GATORADE      Special Medication Instructions from Anesthesiologist   STOP [address] TEA UNTIL AFTER SURGERY.  STOP MOTRIN, ADVIL, ALEVE UNTIL AFTER SURGERY.     TAKE AMLODIPINE, CARVEDILOL, CLONIDINE, ADVAIR, PROTONIX, AM OF SURGERY.    TAKE JANUVIA, BUMETANIDE AT HOME AFTER SURGERY,    DO NOT TAKE TYLENOL. ASPIRIN, VITAMIN D , FERROUS SULFATE, PERICOLACE AM OF SURGERY.     TAKE SIMVASTATIN, CARVEDILOL, ADVAIR, CLONIDINE  PM BEFORE SURGERY.    MAY TAKE ONDANSETRON FOR NAUSEA IF NEEDED AM OF SURGERY.    . Follow eating and drinking restrictions as instructed by surgeon and/or pre surgical services nurse.  . If applicable, follow your surgeon's bowel prep instructions.  . Failure to do so may result in cancellation of your procedure.  . Follow surgeons instructions if they gave specific medication instructions.      Hospital Address and Arrival Instructions:   [address], [place], [place] [address]  [facility] is where you will enter.  A  complimentary valet is available at the front [place] entrance of the hospital.  Please call [phone] morning of surgery if you need assistance upon arrival.    Upon arrival in the hospital main lobby via [employer], you will proceed to:  1) Patient Registration, which is all the way down the hall on the left.  2) Once registration is completed, you will be escorted by the registration staff to the Surgical Services Waiting Area where you wait until a PreOp clinical team member escorts you to a room and initiates the PreOp process.  3) You will need to have a valid photo I.D.,  insurance card and co-pay form of payment if required by insurance.    Pre Surgical General Instructions    1. Bathe or shower the morning of the procedure with an anti-bacterial soap before arriving (unless instructed to use hibiclens).  2. Do not apply lotion, perfume, cologne, or hair-care products such as hair spray or gels.  3. Do not shave your surgical site at home.  4. Do not wear makeup, jewelry including body piercing, watches, earrings, or rings.   5. You may brush your teeth and gargle on the morning of surgery but do not swallow any water.  6. Wear casual, loose fitting and comfortable clothing. A gown will be provided.  7. Plan to leave unnecessary valuables, credit cards (except for co-pay payment use) and jewelry at  home or with a companion on day of surgery for safe keeping. The hospital is not responsible for lost/stolen items.  8. If you wear contacts please leave them at home. If you wear glasses, please bring a case.  9. Dentures - we will provide a container  10. Hearing aids, you may bring dos but you will be asked to remove them before surgery.  11. Please arrange for someone to drive you home. For your safety you will not be allowed to drive home   after sedation or anesthesia. A responsible adult must be present to accompany you home when you are ready to leave. We strongly recommend that all patients have  an adult at home with them for the first 24 hours after surgery.  42. Notify your doctor if you develop any sign of illness before the date of your surgery. Report                 symptoms such as: high fever, sore throat, or other infection, breathing difficulties or chest pain.  13. Discontinue herbal supplements and herbal/green tea one week prior to surgery.      Below is a link/web address  to the Preparing for Your Procedure video that walks you through the surgical experience.   [email or web address].[email or web address]    Possible Anesthesia Side Effects  . Nausea and vomiting. This common side effect usually occurs immediately after the procedure, but some people may continue to feel sick for a day or two. Anti-nausea medicines can help.  . Dry mouth. You may feel parched when you wake up. As long as you're not too nauseated, sipping water can help take care of your dry mouth.  . Sore throat or hoarseness. The tube put in your throat to help you breathe during surgery can leave you with a sore throat after it's removed.  . Chills and shivering. It's common for your body temperature to drop during general anesthesia. Your doctors and nurses will make sure your temperature doesn't fall too much during surgery, but you may wake up shivering and feeling cold. Your chills may last for a few minutes to hours.  . Confusion and fuzzy thinking. When first waking from anesthesia, you may feel confused, drowsy, and foggy. This usually lasts for just a few hours, but for some people - especially older adults - confusion can last for days or weeks.  . Muscle aches. The drugs used to relax your muscles during surgery can cause soreness afterward.  . Itching. If narcotic (opioid) medications are used during or after your operation, you may be itchy. This is a common side effect of this class of drugs.  . Bladder problems. You may have difficulty passing urine for a short time after general anesthesia.  . Dizziness. You  may feel dizzy when you first stand up. Drinking plenty of fluids should help you feel better.

## 2017-06-21 ENCOUNTER — Ambulatory Visit: Payer: Self-pay

## 2017-06-21 ENCOUNTER — Ambulatory Visit: Payer: Medicare Other

## 2017-06-21 ENCOUNTER — Ambulatory Visit: Payer: Medicare Other | Admitting: Certified Registered"

## 2017-06-21 ENCOUNTER — Ambulatory Visit
Admission: RE | Admit: 2017-06-21 | Discharge: 2017-06-21 | Disposition: A | Discharge status: Home / Self Care | Payer: Medicare Other | Source: Ambulatory Visit | Attending: Critical Care Medicine | Admitting: Critical Care Medicine

## 2017-06-21 ENCOUNTER — Encounter
Admission: RE | Disposition: A | Discharge status: Home / Self Care | Payer: Self-pay | Source: Ambulatory Visit | Attending: Critical Care Medicine

## 2017-06-21 DIAGNOSIS — I252 Old myocardial infarction: Secondary | ICD-10-CM | POA: Insufficient documentation

## 2017-06-21 DIAGNOSIS — J479 Bronchiectasis, uncomplicated: Secondary | ICD-10-CM

## 2017-06-21 DIAGNOSIS — Z7982 Long term (current) use of aspirin: Secondary | ICD-10-CM | POA: Insufficient documentation

## 2017-06-21 DIAGNOSIS — I120 Hypertensive chronic kidney disease with stage 5 chronic kidney disease or end stage renal disease: Secondary | ICD-10-CM | POA: Insufficient documentation

## 2017-06-21 DIAGNOSIS — Z951 Presence of aortocoronary bypass graft: Secondary | ICD-10-CM | POA: Insufficient documentation

## 2017-06-21 DIAGNOSIS — E1122 Type 2 diabetes mellitus with diabetic chronic kidney disease: Secondary | ICD-10-CM | POA: Insufficient documentation

## 2017-06-21 DIAGNOSIS — T17890A Other foreign object in other parts of respiratory tract causing asphyxiation, initial encounter: Secondary | ICD-10-CM | POA: Insufficient documentation

## 2017-06-21 DIAGNOSIS — I69354 Hemiplegia and hemiparesis following cerebral infarction affecting left non-dominant side: Secondary | ICD-10-CM | POA: Insufficient documentation

## 2017-06-21 DIAGNOSIS — K219 Gastro-esophageal reflux disease without esophagitis: Secondary | ICD-10-CM | POA: Insufficient documentation

## 2017-06-21 DIAGNOSIS — N186 End stage renal disease: Secondary | ICD-10-CM | POA: Insufficient documentation

## 2017-06-21 DIAGNOSIS — X58XXXA Exposure to other specified factors, initial encounter: Secondary | ICD-10-CM | POA: Insufficient documentation

## 2017-06-21 DIAGNOSIS — R05 Cough: Secondary | ICD-10-CM

## 2017-06-21 DIAGNOSIS — Z992 Dependence on renal dialysis: Secondary | ICD-10-CM | POA: Insufficient documentation

## 2017-06-21 DIAGNOSIS — E7800 Pure hypercholesterolemia, unspecified: Secondary | ICD-10-CM | POA: Insufficient documentation

## 2017-06-21 DIAGNOSIS — I2510 Atherosclerotic heart disease of native coronary artery without angina pectoris: Secondary | ICD-10-CM | POA: Insufficient documentation

## 2017-06-21 HISTORY — DX: Gastro-esophageal reflux disease without esophagitis: K21.9

## 2017-06-21 HISTORY — DX: Shortness of breath: R06.02

## 2017-06-21 HISTORY — DX: Bronchiectasis, uncomplicated: J47.9

## 2017-06-21 HISTORY — DX: Difficulty in walking, not elsewhere classified: R26.2

## 2017-06-21 HISTORY — PX: BRONCHOSCOPY, FIBEROPTIC: SHX3329

## 2017-06-21 LAB — PT/INR
PT INR: 1 (ref 0.9–1.1)
PT: 13 s (ref 12.6–15.0)

## 2017-06-21 LAB — CBC
Absolute NRBC: 0 10*3/uL
Hematocrit: 38.7 % (ref 37.0–47.0)
Hgb: 11.6 g/dL — ABNORMAL LOW (ref 12.0–16.0)
MCH: 21.1 pg — ABNORMAL LOW (ref 28.0–32.0)
MCHC: 30 g/dL — ABNORMAL LOW (ref 32.0–36.0)
MCV: 70.5 fL — ABNORMAL LOW (ref 80.0–100.0)
MPV: 10.7 fL (ref 9.4–12.3)
Nucleated RBC: 0 /100 WBC (ref 0.0–1.0)
Platelets: 208 10*3/uL (ref 140–400)
RBC: 5.49 10*6/uL — ABNORMAL HIGH (ref 4.20–5.40)
RDW: 17 % — ABNORMAL HIGH (ref 12–15)
WBC: 8.24 10*3/uL (ref 3.50–10.80)

## 2017-06-21 LAB — BASIC METABOLIC PANEL
Anion Gap: 11 (ref 5.0–15.0)
BUN: 38.2 mg/dL — ABNORMAL HIGH (ref 7.0–19.0)
CO2: 26 mEq/L (ref 22–29)
Calcium: 9.5 mg/dL (ref 8.5–10.5)
Chloride: 100 mEq/L (ref 100–111)
Creatinine: 4.6 mg/dL — ABNORMAL HIGH (ref 0.6–1.0)
Glucose: 201 mg/dL — ABNORMAL HIGH (ref 70–100)
Potassium: 5.4 mEq/L — ABNORMAL HIGH (ref 3.5–5.1)
Sodium: 137 mEq/L (ref 136–145)

## 2017-06-21 LAB — APTT: PTT: 37 s (ref 23–37)

## 2017-06-21 LAB — GFR: EGFR: 9.6

## 2017-06-21 LAB — GLUCOSE WHOLE BLOOD - POCT
Whole Blood Glucose POCT: 200 mg/dL — ABNORMAL HIGH (ref 70–100)
Whole Blood Glucose POCT: 183 mg/dL — ABNORMAL HIGH (ref 70–100)

## 2017-06-21 SURGERY — BRONCHOSCOPY, FIBEROPTIC
Anesthesia: Anesthesia General

## 2017-06-21 MED ORDER — PROPOFOL INFUSION 10 MG/ML
INTRAVENOUS | Status: DC | PRN
Start: 2017-06-21 — End: 2017-06-21
  Administered 2017-06-21: 50 mg via INTRAVENOUS

## 2017-06-21 MED ORDER — PROPOFOL 10 MG/ML IV EMUL (WRAP)
INTRAVENOUS | Status: AC
Start: 2017-06-21 — End: ?
  Filled 2017-06-21: qty 20

## 2017-06-21 MED ORDER — FENTANYL CITRATE (PF) 50 MCG/ML IJ SOLN (WRAP)
INTRAMUSCULAR | Status: DC | PRN
Start: 2017-06-21 — End: 2017-06-21
  Administered 2017-06-21: 50 ug via INTRAVENOUS

## 2017-06-21 MED ORDER — FENTANYL CITRATE (PF) 50 MCG/ML IJ SOLN (WRAP)
INTRAMUSCULAR | Status: AC
Start: 2017-06-21 — End: ?
  Filled 2017-06-21: qty 2

## 2017-06-21 MED ORDER — LACTATED RINGERS IV SOLN
INTRAVENOUS | Status: DC
Start: 2017-06-21 — End: 2017-06-21

## 2017-06-21 MED ORDER — LIDOCAINE HCL 2 % IJ SOLN
INTRAMUSCULAR | Status: AC
Start: 2017-06-21 — End: 2017-06-21
  Administered 2017-06-21: 09:00:00 100 mg
  Filled 2017-06-21: qty 20

## 2017-06-21 MED ORDER — LIDOCAINE 1% BUFFERED - CNR/OUTSOURCED
0.3000 mL | Freq: Once | INTRAMUSCULAR | Status: AC
Start: 2017-06-21 — End: 2017-06-21
  Administered 2017-06-21: 08:00:00 0.3 mL via INTRADERMAL

## 2017-06-21 MED ORDER — ONDANSETRON HCL 4 MG/2ML IJ SOLN
INTRAMUSCULAR | Status: DC | PRN
Start: 2017-06-21 — End: 2017-06-21
  Administered 2017-06-21: 4 mg via INTRAVENOUS

## 2017-06-21 MED ORDER — ALBUTEROL-IPRATROPIUM 2.5-0.5 (3) MG/3ML IN SOLN
3.0000 mL | Freq: Four times a day (QID) | RESPIRATORY_TRACT | Status: DC
Start: 2017-06-21 — End: 2017-06-21
  Administered 2017-06-21: 09:00:00 3 mL via RESPIRATORY_TRACT

## 2017-06-21 MED ORDER — LIDOCAINE HCL (PF) 2 % IJ SOLN
INTRAMUSCULAR | Status: AC
Start: 2017-06-21 — End: ?
  Filled 2017-06-21: qty 5

## 2017-06-21 MED ORDER — LIDOCAINE HCL (PF) 2 % IJ SOLN
INTRAMUSCULAR | Status: DC | PRN
Start: 2017-06-21 — End: 2017-06-21
  Administered 2017-06-21: 8 mL

## 2017-06-21 MED ORDER — ALBUTEROL-IPRATROPIUM 2.5-0.5 (3) MG/3ML IN SOLN
RESPIRATORY_TRACT | Status: DC
Start: 2017-06-21 — End: 2017-06-21
  Filled 2017-06-21: qty 3

## 2017-06-21 MED ORDER — LIDOCAINE HCL (PF) 2 % IJ SOLN
INTRAMUSCULAR | Status: AC
Start: 2017-06-21 — End: 2017-06-21
  Filled 2017-06-21: qty 5

## 2017-06-21 MED ORDER — LIDOCAINE HCL (PF) 2 % IJ SOLN
5.0000 mL | Freq: Once | INTRAMUSCULAR | Status: AC
Start: 2017-06-21 — End: 2017-06-21
  Administered 2017-06-21: 09:00:00 5 mL via INTRADERMAL

## 2017-06-21 MED ORDER — SODIUM CHLORIDE 0.9 % IV SOLN
30.0000 mL/h | INTRAVENOUS | Status: DC
Start: 2017-06-21 — End: 2017-06-21
  Administered 2017-06-21: 08:00:00 30 mL/h via INTRAVENOUS

## 2017-06-21 MED ORDER — EPINEPHRINE PF 1 MG/10ML IJ SOSY
PREFILLED_SYRINGE | INTRAMUSCULAR | Status: DC
Start: 2017-06-21 — End: 2017-06-21
  Filled 2017-06-21: qty 10

## 2017-06-21 MED ORDER — ONDANSETRON HCL 4 MG/2ML IJ SOLN
INTRAMUSCULAR | Status: AC
Start: 2017-06-21 — End: ?
  Filled 2017-06-21: qty 2

## 2017-06-21 MED ORDER — LIDOCAINE HCL (PF) 2 % IJ SOLN
INTRAMUSCULAR | Status: DC | PRN
Start: 2017-06-21 — End: 2017-06-21
  Administered 2017-06-21: 80 mg via INTRAVENOUS

## 2017-06-21 SURGICAL SUPPLY — 44 items
BLOCK BITE 60FR LF STRAP FLXB SH DISP (Endoscopic Supplies) ×1
BLOCK BITE OD60 FR ADULT STRAP FLEXIBLE (Endoscopic Supplies) ×1
BLOCK BITE OD60 FR ADULT STRAP FLEXIBLE SIDEHOLE (Endoscopic Supplies) ×1 IMPLANT
BRUSH CYTO PTFE CLBR 1.5MM 140CM STRL (Brushes) ×1
BRUSH CYTOLOGY L140 CM BULLET TIP (Brushes) ×1
BRUSH CYTOLOGY L140 CM BULLET TIP ERGONOMIC HANDLE AUTOMATIC STOP (Brushes) ×1 IMPLANT
CATHETER URETHRAL OD18 FR 30 CC FOLEY 2 (Catheter Urine)
CATHETER URETHRAL OD18 FR 30 CC FOLEY 2 WAY BALLOON ATRAUMATIC (Catheter Urine) IMPLANT
CATHETER URTH BACTI-GRD NTR RBR DDRGL 30 (Catheter Urine)
CUTTER WIRE L4.75 IN SCISSORS (Instrument) ×1 IMPLANT
CUTTER WRE 4.75IN LF SCSR DISP (Instrument) ×1
GLOVES EXAM NITRILE ETS MED NS (Glove) ×4 IMPLANT
GOWN ISL SMS XL LF HKLP NK KNIT CUF BLU (Patient Supply) ×2
GOWN ISO YELLOW UNIVERSAL (Gown) IMPLANT
GOWN ISOLATION XL HOOK LOOP NECK KNIT (Patient Supply) ×2 IMPLANT
KIT ENDOSCOPIC 2 BUNDLE COMPLIANCE (Endoscopic Supplies) ×1
KIT ENDOSCOPIC 2 BUNDLE COMPLIANCE ENDOKIT 2 OZ (Endoscopic Supplies) ×1 IMPLANT
KIT GI CUSTOM ILH (Endoscopic Supplies) ×1
LINER SCT 1500CC THNWL MDVC FLX ADV LF (Suction) IMPLANT
MANIFOLD NEPTUNE II 4 PORT (Procedure Accessories) ×2 IMPLANT
PACKET JELLY LUBRICATING 3GM (Patient Supply) IMPLANT
SOLUTION IRR 0.9% NACL 500ML LF STRL PLS (Irrigation Solutions) ×1
SOLUTION IRRIGATION 0.9% SDM CHLORIDE 500ML PR BTTL ISOTONIC NONPRGNC (Irrigation Solutions) ×1 IMPLANT
SOLUTION IRRIGATION 0.9% SODIUM CHLORIDE (Irrigation Solutions) ×1
SOLUTION IV LACTATED RINGERS 1000 ML (IV Solutions)
SOLUTION IV LACTATED RINGERS 1000 ML PLASTIC CONTAINER (IV Solutions) IMPLANT
SOLUTION IV LR 1000ML VFLX LF PLS CNTNR (IV Solutions)
SPONGE GAUZE L4 IN X W4 IN 4 PLY HIGH (Sponge)
SPONGE GAUZE L4 IN X W4 IN 4 PLY NONWOVEN LINT FREE CURITY RAYON (Sponge) IMPLANT
SPONGE GZE RYN PLSTR CRTY 4X4IN LF NS 4 (Sponge)
SYRINGE 50 ML GRADUATE NONPYROGENIC DEHP (Syringes, Needles) ×1
SYRINGE 50 ML GRADUATE NONPYROGENIC DEHP FREE PVC FREE BD MEDICAL (Syringes, Needles) ×1 IMPLANT
SYRINGE LEUR LOK TIP 30 ML (Syringes, Needles) IMPLANT
SYRINGE MED 10ML BD LF STRL ST DISP (Syringes, Needles) ×2 IMPLANT
SYRINGE MED 50ML LF STRL GRAD N-PYRG (Syringes, Needles) ×1
TRAP MCS PLS 80CC LF STRL SCR CAP VL (Procedure Accessories) ×1
TRAP MUCUS SCREW CAP VIAL TRANSPORT CUP (Procedure Accessories) ×1
TRAP MUCUS SCREW CAP VIAL TRANSPORT CUP IDENTIFICATION LABEL PLASTIC (Procedure Accessories) ×1 IMPLANT
VALVE BIOPSY SINGLE USE BIOPSY VALVE STERILE ULTRASONIC BRONCHOSCOPE (Disposable Instruments) ×1 IMPLANT
VALVE BIOPSY ULTRASONIC BRONCHOSCOPE (Disposable Instruments) ×1
VALVE BX LF STRL DISP US BSCP (Disposable Instruments) ×1
VALVE SCT LF STRL FLXB ATCH DISP BSCP (Disposable Instruments) ×1
VALVE SUCTION FLEXIBLE ATTACH (Disposable Instruments) ×1
VALVE SUCTION SINGLE USE SUCTION VALVE STERILE FLEXIBLE ATTACH (Disposable Instruments) ×1 IMPLANT

## 2017-06-21 NOTE — Discharge Instructions (Signed)
DO NOT EAT OR DRINK UNTIL 1220pm        Flexible Bronchoscopy  A flexible bronchoscopy is an exam of the airways of your lungs. A thin, flexible tube calleda bronchoscope is used. It has a light and small camera that allow the healthcare provider to view your airways.    Before your test   Follow your healthcare provider's instructions carefully. If you don't, the exam may be canceled. Or you may need to take it again.   If you are taking blood-thinning medicine, ask your healthcare provider if you should stop taking the medicine before this test.   Have no food or drink for at least 8 hours before the test. Also, avoid smoking for 24 hours before the test.   You will need to remove any dentures or removable devices from your mouth.   Right before the test, you will be given sedating medicines to help you relax. The medicine may be given by an IV (intravenously) into one of your veins.In addition, your nose and throat may be numbed with a special spray to help prevent gagging and coughing.   If you are having this test as an outpatient, make sure you have an adult friend or family member to drive you home.  During your test  Bronchoscopy takes 45 to 60 minutes and includes the following steps:   You may be given medicine (anesthesia) so that you are unconscious or asleep during the procedure.   The healthcare provider inserts the tube into your nose or mouth.   If you have not been given anesthesia, you might feel a gagging sensation. To help ease this feeling, you will be told to swallow or take deep breaths. Your airway will remain open even with the tube in place. But you won't be able to talk.   The provider checks your breathing passage. He or she may also remove tiny tissue samples for biopsy.  After your test   You may have a mild sore throat or cough. Your voice may also be hoarse.   Don't eat or drink until the anesthesia wears off.   If you had a biopsy, you might see traces of blood being  coughed up.  When to call your healthcare provider  Call your healthcare provider right away if you have any of the following:   Shortness of breath   Chest pain   Bleeding from your nose or throat   Coughing up a large amount of blood   A fever above 100.51F (38C) for more than 24 hours  Call 911  Call 911 if you have:   Chest pain   Severe shortness of breath   Date Last Reviewed: 08/05/2015   2000-2018 [employer]. [address], [place] [address]. All rights reserved. This information is not intended as a substitute for professional medical care. Always follow your healthcare professional's instructions.      Post Anesthesia Discharge Instructions    Although you may be awake and alert in the recovery room, small amounts of anesthetic remain in your system for about 24 hours.  You may feel tired and sleepy during this time.      You are advised to go directly home from the hospital.    Plan to stay at home and rest for the remainder of the day.    It is advisable to have someone with you at home for 24 hours after surgery.    Do not operate a  motor vehicle, or any mechanical or electrical equipment for the next 24 hours.      Be careful when you are walking around, you may become dizzy.  The effects of anesthesia and/or medications are still present and drowsiness may occur    Do not consume alcohol, tranquilizers, sleeping medications, or any other non prescribed medication for the remainder of the day.    Diet:  begin with liquids, progress your diet as tolerated or as directed by your surgeon.  Nausea and vomiting may occur in the next 24 hours.

## 2017-06-21 NOTE — PACU (Signed)
Pt discharged to home,IV removed, pt daughter educated on next dose due for meds, educated that pt cannot eat or drink until 1220pm, pt daughter educated on post anesthesia instructions, and s/s to watch for at home. Pt has no pain, has stable vs, pt taken to car via wheelchair with belongings. Pt discharged from stage 1 meeting stage 2 criteria, pt daughter states she understands discharge instructions. Pt has no pneumothorax on film stable for discharge.

## 2017-06-21 NOTE — Anesthesia Postprocedure Evaluation (Signed)
Anesthesia Post Evaluation    Patient: Jacqueline Weber    Procedure(s):  BRONCHOSCOPY with brushing and wash    Anesthesia type: General LMA    Last Vitals:   Vitals:    06/21/17 1029   BP: 161/75   Pulse: 61   Resp: 20   Temp: 36.5 C (97.7 F)   SpO2: 100%       Not complete    Anesthesia Qualified Clinical Data Registry 2018    PACU Reintubation  Did the Patient have general anesthesia with intubation: No        PONV Adult  Is the patient aged 64 or older: Yes  Did the patient receive recieve a general anesthestic: Yes  Does the patient have 3 or more risk factors for PONV? Yes  Did the patient receive anti-emetics from at least two classes of medications? Yes      PONV Pediatric  Is the patient aged 48-17? No            PACU Transfer Checklist Protocol  Was the patient transferred to the PACU at the conclusion of surgery? Yes  Was a checklist or transfer protocol used? Yes    ICU Transfer Checklist Protocol  Was the patient transferred to the ICU at the conclusion of surgery? No      Post-op Pain Assessment Prior to Anesthesia Care End  Age >=18 and assessed for pain in PACU: Yes  Pacu pain score <7/10: Yes      Perioperative Mortality  Perioperative mortality prior to Anesthesia end time: No    Perioperative Cardiac Arrest  Did the patient have an unanticipated intraoperative cardiac arrest between anesthesia start time and anesthesia end time? No    Unplanned Admission to ICU  Did the patient have an unplanned admission to the ICU (not initially anticipated at anesthesia start time)? No      Signed by: Norlene Campbell, 06/21/2017 10:30 AM

## 2017-06-21 NOTE — Transfer of Care (Signed)
Anesthesia Transfer of Care Note    Patient: Jacqueline Weber    Procedures performed: Procedure(s):  BRONCHOSCOPY with brushing and wash    Anesthesia type: General LMA    Patient location:Phase I PACU    Last vitals:   Vitals:    06/21/17 1029   BP: 161/75   Pulse: 61   Resp: 20   Temp: 36.5 C (97.7 F)   SpO2: 100%       Post pain: Patient not complaining of pain, continue current therapy      Mental Status:awake and alert     Respiratory Function: tolerating face mask    Cardiovascular: stable    Nausea/Vomiting: patient not complaining of nausea or vomiting    Hydration Status: adequate    Post assessment: no apparent anesthetic complications    Signed by: Norlene Campbell  06/21/17 10:31 AM

## 2017-06-21 NOTE — Progress Notes (Signed)
BRONCHOSCOPY   Indication :  Chronic cough / bronchiectasis     Informed consent obtained  Risks and alternatives discussed   Pt identified  Please see anesthesia and nursing notes    Route:  LMA  Upper AWs : normal VC   Trachea - patent   Carina : sharp     Lower AWs examined in that order -- Rt main / Bronchus intermedius / RML / RLL and RUL - Lt main / LUL / lingula and LLL -    Thick mucous plugging RLL and especially LLL completely occluded with thick secretions ans mucous plugging . After cleaning LLL mucosa moderately and RLL mucosa mildly inflamed .   Bilateral wash  Brush LLL   2 passes   No endobronchial tumour   See pictures       Secretions: thick with mucous plugging as described  Blood Loss : zero    Tolerated well  No immediate complications:  Post Procedure CXR ordered        RECOMMEND:  Follow cultures  And Tx   mucolytics + BD  Flutter

## 2017-06-21 NOTE — Anesthesia Preprocedure Evaluation (Signed)
Anesthesia Evaluation    AIRWAY    Mallampati: I         CARDIOVASCULAR    cardiovascular exam normal       DENTAL    no notable dental hx     PULMONARY    pulmonary exam normal     OTHER FINDINGS              Relevant Problems   (+) Diabetes mellitus   (+) End stage renal disease   (+) HTN (hypertension) urgency               Anesthesia Plan    ASA 3     general                     intravenous induction           Post op pain management: per surgeon    informed consent obtained    Plan discussed with CRNA.    ECG reviewed  pertinent labs reviewed             Signed by: Quentin Mulling 06/21/17 8:27 AM

## 2017-06-21 NOTE — H&P (Signed)
PREOP H&P    Date Time: 06/21/17 10:20 AM  Patient Name: Jacqueline Weber, Jacqueline Weber   [id number]  Attending Physician: Danae Chen, MD    Assessment:   Pre-Op Diagnosis Codes:     * Chronic cough [R05]  Plan:   Procedure(s):  BRONCHOSCOPY with brushing and wash.    Informed consent per documented office discussion and/or consent form.    Problem List:     Patient Active Problem List    Diagnosis Date Noted   . Bronchiectasis 06/21/2017   . Diabetes mellitus    . Chronic renal failure, stage 5 05/08/2017   . Anemia in chronic kidney disease (CODE) 04/28/2017   . Iron deficiency anemia, unspecified iron deficiency anemia type 04/28/2017   . Acute bronchitis 04/26/2017   . End stage renal disease 04/25/2017   . NSTEMI (non-ST elevated myocardial infarction) 06/08/2016   . Elevated brain natriuretic peptide (BNP) level    . Hyponatremia    . History of CVA (cerebrovascular accident)    . Hyperlipidemia 03/11/2013   . HTN (hypertension) urgency 03/09/2013     History of Present Illness:   Jacqueline Weber is a 66 y.o. female.  No change from chronic history recorded in office notes. Symptoms indications/procedure indications as per assessment section above.   Past Medical History:     Past Medical History:   Diagnosis Date   . Anemia 10/02/2016    H/O ANEMIA - REFLECTED ON CURRENT LABS.   [name] Cataracts, bilateral     H/O CATARACTS - SURGERY DONE - STILL HAS SOME ISSUES.   . Cerebrovascular accident 2014    H/O STROKE - WEAKNESS ON LEFT SIDE. NO NEUROLOGIST. USES CANE FOR BALANCE.   [name] Chronic renal failure, stage 5 05/08/2017   . Constipation    . Coronary artery disease 06/2016    H/O QUADRUPLE BYPASS SURGERY - FOLLOWED BY [facility].   . Diabetes mellitus    . Difficulty walking     WALKS WITH CANE. WILL USE WC IN HOSPITAL   . End-stage renal disease 04/2017    DIALYSIS [facility] [place] MON-WED-FRI. HAS PERMACATH RIGHT CHEST   . Gastroesophageal reflux disease     MED EFFECTIVE    . History of MI (myocardial infarction)    . 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 Hypercholesteremia     CONTROLLED WITH MEDS.   [name] Hypertension     CONTROLLED WITH MEDS.   [name] Myocardial infarction 06/2016   . Pre-diabetes    . Renal insufficiency     CHRONIC KIDNEY DISEASE - FOLLOWED BY DR. Kate Sable.   [name] Shortness of breath     MODERATE EXERTION    . Type 2 diabetes mellitus, controlled     AM FSBS 150-200. INSTRUCTED TO CONTROL DIET CLOSELY TONIGHT.  HAD BEEN OFF JANUVIA, RESTARTED 06/15/17. HA1C 9 06/15/17. MANAGED BY PCP.      Past Surgical History:     Past Surgical History:   Procedure Laterality Date   . CORONARY ARTERY BYPASS N/A 06/12/2016    Procedure: Coronary Artery Bypass x 4.;  Surgeon: Bjorn Loser, MD;  Location: [facility] OR;  Service: Cardiothoracic;  Laterality: N/A;  LIMA to LDA  Vein to PDA  Vein to OM  Vein to Diagonal   . EGD, COLONOSCOPY N/A 10/04/2016   . EGD, COLONOSCOPY N/A 10/04/2016    Procedure: EGD w/ bx, COLONOSCOPY w/ bx, polypectomy;  Surgeon: Norton Blizzard, MD;  Location: [place] [facility] OR;  Service: Gastroenterology;  Laterality: N/A;   . ENDOSCOPIC,VEIN HARVEST N/A 06/12/2016    Procedure: Endoscopic, Left Leg Greater Saphenous Vein Harvest;  Surgeon: Bjorn Loser, MD;  Location: [facility] OR;  Service: Cardiothoracic;  Laterality: N/A;  Left Leg Incision (medially, groin to ankle) @ 0742  Vein out of leg @ 0826  Vein prep complete @ 0845  Total time = 63 minutes   . FORMATION, UPPER EXTREMITY, A-V FISTULA Left 05/31/2017    Procedure: FORMATION, UPPER EXTREMITY, A-V FISTULA;  Surgeon: Cherene Altes, MD;  Location: [place] [facility] OR;  Service: Vascular;  Laterality: Left;  LEFT ARM A-V FISTULA       Family History:     Family History   Problem Relation Age of Onset   . No known problems Mother    . No known problems Father      Social History:     Social History     Social History   . Marital status: Widowed     Spouse name: N/A   . Number of children: N/A   . Years of education: N/A     Social History Main Topics    . Smoking status: Never Smoker   . Smokeless tobacco: Never Used   . Alcohol use No   . Drug use: No   . Sexual activity: No     Other Topics Concern   . Not on file     Social History Narrative   . No narrative on file     Allergies:     Allergies   Allergen Reactions   . Mosquito (Culex Pipiens) Allergy Skin Test Shortness Of Breath     And  Trouble  breathing   . Shellfish-Derived Products Hives     New  allergy  New  allergy   . Lisinopril Hives     Patient cannot remember the exact nature of the allergy   . Penicillins Rash     Swelling,  numbness     Medications:     Current Discharge Medication List      CONTINUE these medications which have NOT CHANGED    Details   acetaminophen (TYLENOL) 325 MG tablet Take 2 tablets (650 mg total) by mouth every 4 (four) hours as needed for Pain or Fever.      amLODIPine (NORVASC) 10 MG tablet Take 1 tablet (10 mg total) by mouth daily.  Qty: 90 tablet, Refills: 1      aspirin EC 325 MG EC tablet Take 1 tablet (325 mg total) by mouth daily.  Qty: 30 tablet, Refills: 0      bumetanide (BUMEX) 0.5 MG tablet TAKE ONE TABLET BY MOUTH ONCE DAILY  Qty: 90 tablet, Refills: 1      carvedilol (COREG) 25 MG tablet 12.5 mg 2 (two) times daily with meals.          Cholecalciferol (VITAMIN D3) 2000 units Tab Take 4,000 IU by mouth daily.      cloNIDine (CATAPRES) 0.2 MG tablet Take 1 tablet (0.2 mg total) by mouth 2 (two) times daily.  Qty: 60 tablet, Refills: 0      ferrous sulfate 324 (65 FE) MG Tablet Delayed Response TAKE ONE TABLET BY MOUTH IN THE MORNING WITH BREAKFAST  Qty: 90 tablet, Refills: 1      fluticasone-salmeterol (ADVAIR DISKUS) 100-50 MCG/DOSE Aerosol Powder, Breath Activtivatede Inhale 1 puff into the lungs 2 (two) times daily.  Qty: 1  each, Refills: 0      pantoprazole (PROTONIX) 40 MG tablet TAKE ONE TABLET BY MOUTH IN THE MORNING BEFORE BREAKFAST  Qty: 90 tablet, Refills: 1      simvastatin (ZOCOR) 20 MG tablet Take 20 mg by mouth nightly.          SITagliptin  (JANUVIA) 25 MG tablet Take 1 tablet (25 mg total) by mouth daily.  Qty: 30 tablet, Refills: 1    Associated Diagnoses: Type 2 diabetes mellitus with chronic kidney disease on chronic dialysis, without long-term current use of insulin      ondansetron (ZOFRAN-ODT) 4 MG disintegrating tablet Take 1 tablet (4 mg total) by mouth every 6 (six) hours as needed for Nausea.  Qty: 20 tablet, Refills: 3    Associated Diagnoses: Nausea      senna-docusate (PERICOLACE) 8.6-50 MG per tablet Take 2 tablets by mouth 2 (two) times daily.  Qty: 30 tablet, Refills: 0      traMADol (ULTRAM) 50 MG tablet Take 50 mg by mouth every 6 (six) hours as needed for Pain.           Review of Systems:   No change from previous documentation  Physical Exam:     Vitals:    06/21/17 0813   BP: 147/67   Pulse: (!) 59   Resp: 18   Temp: 97.3 F (36.3 C)   SpO2: 99%     Chest:  Clear with no abnormal breath sounds  Heart:  Regular rhythm, no significant murmurs.  Abdomen:  Soft, nontender, no mass or hepatosplenomegaly.  Neurologic: Alert and oriented, no obvious focal motor defects.      Signed by: Danae Chen

## 2017-06-22 LAB — LAB USE ONLY - HISTORICAL NON-GYN MEDICAL CYTOLOGY

## 2017-06-25 ENCOUNTER — Encounter: Payer: Self-pay | Admitting: Critical Care Medicine

## 2017-07-02 ENCOUNTER — Ambulatory Visit (INDEPENDENT_AMBULATORY_CARE_PROVIDER_SITE_OTHER): Payer: Medicare Other

## 2017-07-02 DIAGNOSIS — N186 End stage renal disease: Secondary | ICD-10-CM

## 2017-07-02 DIAGNOSIS — N185 Chronic kidney disease, stage 5: Secondary | ICD-10-CM

## 2017-07-02 NOTE — Procedures (Signed)
Full report will follow into chart

## 2017-07-03 ENCOUNTER — Ambulatory Visit (INDEPENDENT_AMBULATORY_CARE_PROVIDER_SITE_OTHER): Payer: Medicare Other | Admitting: Specialist

## 2017-07-03 ENCOUNTER — Encounter (INDEPENDENT_AMBULATORY_CARE_PROVIDER_SITE_OTHER): Payer: Self-pay | Admitting: Specialist

## 2017-07-03 ENCOUNTER — Ambulatory Visit: Payer: Medicare Other

## 2017-07-03 VITALS — BP 119/51 | HR 79 | Ht <= 58 in | Wt 125.0 lb

## 2017-07-03 DIAGNOSIS — N185 Chronic kidney disease, stage 5: Secondary | ICD-10-CM

## 2017-07-03 NOTE — Progress Notes (Signed)
[facility]    Chief Complaint   Patient presents with   . Follow-up     post op  to left arm AVF          History of Present Illness     Jacqueline Weber is a 66 y.o. female who presents Status post left brachiocephalic AV fistula.  Patient has no complaints    Past Medical History     Past Medical History:   Diagnosis Date   . Anemia 10/02/2016    H/O ANEMIA - REFLECTED ON CURRENT LABS.   [name] Cataracts, bilateral     H/O CATARACTS - SURGERY DONE - STILL HAS SOME ISSUES.   . Cerebrovascular accident 2014    H/O STROKE - WEAKNESS ON LEFT SIDE. NO NEUROLOGIST. USES CANE FOR BALANCE.   [name] Chronic renal failure, stage 5 05/08/2017   . Constipation    . Coronary artery disease 06/2016    H/O QUADRUPLE BYPASS SURGERY - FOLLOWED BY [facility].   . Diabetes mellitus    . Difficulty walking     WALKS WITH CANE. WILL USE WC IN HOSPITAL   . End-stage renal disease 04/2017    DIALYSIS [facility] [place] MON-WED-FRI. HAS PERMACATH RIGHT CHEST   . Gastroesophageal reflux disease     MED EFFECTIVE    . History of MI (myocardial infarction)    . 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 Hypercholesteremia     CONTROLLED WITH MEDS.   [name] Hypertension     CONTROLLED WITH MEDS.   [name] Myocardial infarction 06/2016   . Pre-diabetes    . Renal insufficiency     CHRONIC KIDNEY DISEASE - FOLLOWED BY DR. Kate Sable.   [name] Shortness of breath     MODERATE EXERTION    . Type 2 diabetes mellitus, controlled     AM FSBS 150-200. INSTRUCTED TO CONTROL DIET CLOSELY TONIGHT.  HAD BEEN OFF JANUVIA, RESTARTED 06/15/17. HA1C 9 06/15/17. MANAGED BY PCP.        Allergies     Allergies   Allergen Reactions   . Mosquito (Culex Pipiens) Allergy Skin Test Shortness Of Breath     And  Trouble  breathing   . Shellfish-Derived Products Hives     New  allergy  New  allergy   . Lisinopril Hives     Patient cannot remember the exact nature of the allergy   . Penicillins Rash     Swelling,  numbness       Medications     Current Outpatient Prescriptions on File Prior to Visit    Medication Sig Dispense Refill   . acetaminophen (TYLENOL) 325 MG tablet Take 2 tablets (650 mg total) by mouth every 4 (four) hours as needed for Pain or Fever.     [name] amLODIPine (NORVASC) 10 MG tablet Take 1 tablet (10 mg total) by mouth daily. (Patient taking differently: Take 10 mg by mouth every morning.    ) 90 tablet 1   . aspirin EC 325 MG EC tablet Take 1 tablet (325 mg total) by mouth daily. 30 tablet 0   . bumetanide (BUMEX) 0.5 MG tablet TAKE ONE TABLET BY MOUTH ONCE DAILY (Patient taking differently: TAKE ONE TABLET BY MOUTH ONCE DAILY-AM) 90 tablet 1   . carvedilol (COREG) 25 MG tablet 12.5 mg 2 (two) times daily with meals.         . Cholecalciferol (VITAMIN D3) 2000 units Tab Take 4,000 IU  by mouth daily.     . cloNIDine (CATAPRES) 0.2 MG tablet Take 1 tablet (0.2 mg total) by mouth 2 (two) times daily. 60 tablet 0   . ferrous sulfate 324 (65 FE) MG Tablet Delayed Response TAKE ONE TABLET BY MOUTH IN THE MORNING WITH BREAKFAST 90 tablet 1   . fluticasone-salmeterol (ADVAIR DISKUS) 100-50 MCG/DOSE Aerosol Powder, Breath Activtivatede Inhale 1 puff into the lungs 2 (two) times daily. 1 each 0   . ondansetron (ZOFRAN-ODT) 4 MG disintegrating tablet Take 1 tablet (4 mg total) by mouth every 6 (six) hours as needed for Nausea. 20 tablet 3   . pantoprazole (PROTONIX) 40 MG tablet TAKE ONE TABLET BY MOUTH IN THE MORNING BEFORE BREAKFAST 90 tablet 1   . senna-docusate (PERICOLACE) 8.6-50 MG per tablet Take 2 tablets by mouth 2 (two) times daily. 30 tablet 0   . simvastatin (ZOCOR) 20 MG tablet Take 20 mg by mouth nightly.         [name] SITagliptin (JANUVIA) 25 MG tablet Take 1 tablet (25 mg total) by mouth daily. (Patient taking differently: Take 25 mg by mouth Daily after lunch.    ) 30 tablet 1   . traMADol (ULTRAM) 50 MG tablet Take 50 mg by mouth every 6 (six) hours as needed for Pain.       No current facility-administered medications on file prior to visit.        Review of Systems     Constitutional:  Negative for fevers and chills  Skin: No rash or lesions  Respiratory: Negative for cough, wheezing, or hemoptysis  Cardiovascular: as per HPI  Gastrointestinal: Negative for abdominal pain, nausea, vomiting and diarrhea  Musculoskeletal:  No arthritic symptoms  Genitourinary: Negative for dysuria  All other systems were reviewed and are negative except what is stated in the HPI    Physical Exam     Vitals:    07/03/17 0907   BP: 119/51   Pulse: 79       Body mass index is 26.13 kg/m.    General: Patient appears their stated age, well-nourished. Alert and in no apparent distress.  Lungs: Respiratory effort unlabored, chest expansion symmetric.  Cardiac: RRR, no carotid bruits, no JVD.   Extremities warm, Right Femoral pulses 2+, Left Femoral 2+, Right popliteal 2+, Left popliteal 2+, Right DP 2+, Left DP 2+, Right PT 2+, Left PT 2+,   Abd: Soft, nondistended, nontender. No guarding or rebound, No mid line pulsatile mass   YOV:ZCHY ROM in all 4 extremities, symmetric .  Good thrill with maturation left arm AV fistula with pulsation near the arterial anastomosis.  This right IJ permacath  Skin: Color appropriate for race, Skin warm, dry, no gangrene, no non healing ulcers, no varicose veins , no hyperpigmentation, no lipo-dermatosclerosis  Neuro: Good insight and judgment, oriented to person, place, and time CN II-XII intact, gross motor and sensory intact      Labs     CBC:   WBC   Date/Time Value Ref Range Status   06/21/2017 08:14 AM 8.24 3.50 - 10.80 x10 3/uL Final     RBC   Date/Time Value Ref Range Status   06/21/2017 08:14 AM 5.49 (H) 4.20 - 5.40 x10 6/uL Final     Hgb   Date/Time Value Ref Range Status   06/21/2017 08:14 AM 11.6 (L) 12.0 - 16.0 g/dL Final     Hematocrit   Date/Time Value Ref Range Status   06/21/2017 08:14 AM 38.7  37.0 - 47.0 % Final     MCV   Date/Time Value Ref Range Status   06/21/2017 08:14 AM 70.5 (L) 80.0 - 100.0 fL Final     MCHC   Date/Time Value Ref Range Status   06/21/2017  08:14 AM 30.0 (L) 32.0 - 36.0 g/dL Final     RDW   Date/Time Value Ref Range Status   06/21/2017 08:14 AM 17 (H) 12 - 15 % Final     Platelets   Date/Time Value Ref Range Status   06/21/2017 08:14 AM 208 140 - 400 x10 3/uL Final       CMP:   Sodium   Date/Time Value Ref Range Status   06/21/2017 08:15 AM 137 136 - 145 mEq/L Final     Potassium   Date/Time Value Ref Range Status   06/21/2017 08:15 AM 5.4 (H) 3.5 - 5.1 mEq/L Final     Chloride   Date/Time Value Ref Range Status   06/21/2017 08:15 AM 100 100 - 111 mEq/L Final     CO2   Date/Time Value Ref Range Status   06/21/2017 08:15 AM 26 22 - 29 mEq/L Final     Glucose   Date/Time Value Ref Range Status   06/21/2017 08:15 AM 201 (H) 70 - 100 mg/dL Final     Comment:     ADA guidelines for diabetes mellitus:  Fasting:  Equal to or greater than 126 mg/dL  Random:   Equal to or greater than 200 mg/dL       BUN   Date/Time Value Ref Range Status   06/21/2017 08:15 AM 38.2 (H) 7.0 - 19.0 mg/dL Final     Protein, Total   Date/Time Value Ref Range Status   03/08/2017 10:05 AM 5.8 (L) 6.0 - 8.3 g/dL Final   06/02/2016 04:21 AM 4.6 (L) 6.0 - 8.3 g/dL Final     Alkaline Phosphatase   Date/Time Value Ref Range Status   03/08/2017 10:05 AM 81 37 - 106 U/L Final     AST (SGOT)   Date/Time Value Ref Range Status   03/08/2017 10:05 AM 23 5 - 34 U/L Final     ALT   Date/Time Value Ref Range Status   03/08/2017 10:05 AM 15 0 - 55 U/L Final     Anion Gap   Date/Time Value Ref Range Status   06/21/2017 08:15 AM 11.0 5.0 - 15.0 Final       Lipid Panel   Cholesterol   Date/Time Value Ref Range Status   10/16/2016 10:34 AM 146 0 - 199 mg/dL Final     Triglycerides   Date/Time Value Ref Range Status   10/16/2016 10:34 AM 65 34 - 149 mg/dL Final     HDL   Date/Time Value Ref Range Status   10/16/2016 10:34 AM 50 40 - 9,999 mg/dL Final     Comment:     An HDL cholesterol <40 mg/dL is low and constitutes a  coronary heart disease risk factor, and HDL-C>59 mg/dL is  a negative risk factor  for CHD.  Ref: American Heart Association; Circulation 2004         Coags:   PT   Date/Time Value Ref Range Status   06/21/2017 08:14 AM 13.0 12.6 - 15.0 sec Final     PT INR   Date/Time Value Ref Range Status   06/21/2017 08:14 AM 1.0 0.9 - 1.1 Final     Comment:     Recommended Ranges for  Protime INR:    2.0-3.0 for most medical and surgical thromboembolic states    2.2-4.8 for artificial heart valves  INR result may not represent exact Warfarin dosing level during  the transition period from Heparin to Warfarin therapy.  Recommend close clinical monitoring.       PTT   Date/Time Value Ref Range Status   06/21/2017 08:14 AM 37 23 - 37 sec Final     Comment:     In vivo therapeutic range of heparin (0.3 - 0.7 IU/mL)  correlate with the following APTT times: 64 - 102 seconds.         Assessment and Plan       1. Chronic renal failure, stage 5         The left brachiocephalic AV fistula is maturing nicely.  There is area of focal stenosis approximately a centimeter from the arterial anastomosis, which would be best treated with patch angioplasties since it is a fresh fistula and I am hopeful that she will not require to undergo a transposition since the cephalic vein appears to be rather superficial.  This was explained to the patient's daughter and she should be able to use her fistula in about 2-4 weeks.  The risks and benefits of the procedures were explained to the patient and agreed. The risk of infection, bleeding, thrombosis, steal syndrome, nerve injury and other complications were explained in detail and agreed.     Dr. Kate Sable I would like to thank you for allowing me to participate in the care of this patient.    With regards    Shary Key, MD, [place], [facility]  Chief, Section of V[facility]

## 2017-07-05 ENCOUNTER — Telehealth (INDEPENDENT_AMBULATORY_CARE_PROVIDER_SITE_OTHER): Payer: Self-pay | Admitting: Specialist

## 2017-07-05 NOTE — Telephone Encounter (Signed)
Pre-op testing to be determined by [facility].  Office will call patient 1 week prior to discuss medication instructions.

## 2017-07-05 NOTE — Telephone Encounter (Signed)
Revision of left arm AV fistula     Patient tentatively scheduled for surgery with Dr. Lutricia Feil 07/16/17

## 2017-07-13 ENCOUNTER — Encounter (INDEPENDENT_AMBULATORY_CARE_PROVIDER_SITE_OTHER): Payer: Self-pay

## 2017-07-16 ENCOUNTER — Ambulatory Visit
Admission: RE | Admit: 2017-07-16 | Discharge: 2017-07-16 | Disposition: A | Discharge status: Home / Self Care | Payer: Medicare Other | Source: Ambulatory Visit | Attending: Specialist | Admitting: Specialist

## 2017-07-16 ENCOUNTER — Other Ambulatory Visit: Payer: Self-pay

## 2017-07-16 ENCOUNTER — Ambulatory Visit: Payer: Medicare Other | Admitting: Adult Health

## 2017-07-16 ENCOUNTER — Encounter
Admission: RE | Disposition: A | Discharge status: Home / Self Care | Payer: Self-pay | Source: Ambulatory Visit | Attending: Specialist

## 2017-07-16 DIAGNOSIS — K219 Gastro-esophageal reflux disease without esophagitis: Secondary | ICD-10-CM | POA: Insufficient documentation

## 2017-07-16 DIAGNOSIS — I252 Old myocardial infarction: Secondary | ICD-10-CM | POA: Insufficient documentation

## 2017-07-16 DIAGNOSIS — Z951 Presence of aortocoronary bypass graft: Secondary | ICD-10-CM | POA: Insufficient documentation

## 2017-07-16 DIAGNOSIS — E1122 Type 2 diabetes mellitus with diabetic chronic kidney disease: Secondary | ICD-10-CM | POA: Insufficient documentation

## 2017-07-16 DIAGNOSIS — I120 Hypertensive chronic kidney disease with stage 5 chronic kidney disease or end stage renal disease: Secondary | ICD-10-CM | POA: Insufficient documentation

## 2017-07-16 DIAGNOSIS — E7800 Pure hypercholesterolemia, unspecified: Secondary | ICD-10-CM | POA: Insufficient documentation

## 2017-07-16 DIAGNOSIS — N185 Chronic kidney disease, stage 5: Secondary | ICD-10-CM

## 2017-07-16 DIAGNOSIS — N186 End stage renal disease: Secondary | ICD-10-CM | POA: Insufficient documentation

## 2017-07-16 DIAGNOSIS — Z992 Dependence on renal dialysis: Secondary | ICD-10-CM | POA: Insufficient documentation

## 2017-07-16 DIAGNOSIS — N184 Chronic kidney disease, stage 4 (severe): Secondary | ICD-10-CM

## 2017-07-16 DIAGNOSIS — I2510 Atherosclerotic heart disease of native coronary artery without angina pectoris: Secondary | ICD-10-CM | POA: Insufficient documentation

## 2017-07-16 DIAGNOSIS — I69354 Hemiplegia and hemiparesis following cerebral infarction affecting left non-dominant side: Secondary | ICD-10-CM | POA: Insufficient documentation

## 2017-07-16 DIAGNOSIS — K5900 Constipation, unspecified: Secondary | ICD-10-CM | POA: Insufficient documentation

## 2017-07-16 DIAGNOSIS — T82858A Stenosis of vascular prosthetic devices, implants and grafts, initial encounter: Secondary | ICD-10-CM | POA: Insufficient documentation

## 2017-07-16 HISTORY — PX: REVISION, A-V FISTULA UPPER EXTREMITY: SHX5416

## 2017-07-16 LAB — POTASSIUM WHOLE BLOOD: Whole Blood Potassium: 5.8 mEq/L — ABNORMAL HIGH (ref 3.6–5.0)

## 2017-07-16 LAB — GLUCOSE WHOLE BLOOD - POCT: Whole Blood Glucose POCT: 169 mg/dL — ABNORMAL HIGH (ref 70–100)

## 2017-07-16 SURGERY — REVISION, A-V FISTULA, UPPER EXTREMITY
Anesthesia: Anesthesia General | Site: Arm Lower | Laterality: Left | Wound class: Clean

## 2017-07-16 MED ORDER — ONDANSETRON HCL 4 MG/2ML IJ SOLN
INTRAMUSCULAR | Status: DC | PRN
Start: 2017-07-16 — End: 2017-07-16
  Administered 2017-07-16: 4 mg via INTRAVENOUS

## 2017-07-16 MED ORDER — SODIUM CHLORIDE 0.9 % IV SOLN
INTRAVENOUS | Status: DC
Start: 2017-07-16 — End: 2017-07-16

## 2017-07-16 MED ORDER — FENTANYL CITRATE (PF) 50 MCG/ML IJ SOLN (WRAP)
INTRAMUSCULAR | Status: AC
Start: 2017-07-16 — End: ?
  Filled 2017-07-16: qty 2

## 2017-07-16 MED ORDER — PROPOFOL 10 MG/ML IV EMUL (WRAP)
INTRAVENOUS | Status: AC
Start: 2017-07-16 — End: ?
  Filled 2017-07-16: qty 40

## 2017-07-16 MED ORDER — BUPIVACAINE-EPINEPHRINE (PF) 0.5% -1:200000 IJ SOLN
INTRAMUSCULAR | Status: AC
Start: 2017-07-16 — End: ?
  Filled 2017-07-16: qty 30

## 2017-07-16 MED ORDER — LIDOCAINE HCL 2 % IJ SOLN
INTRAMUSCULAR | Status: DC | PRN
Start: 2017-07-16 — End: 2017-07-16
  Administered 2017-07-16: 30 mg via INTRAVENOUS

## 2017-07-16 MED ORDER — PROPOFOL INFUSION 10 MG/ML
INTRAVENOUS | Status: DC | PRN
Start: 2017-07-16 — End: 2017-07-16
  Administered 2017-07-16: 20 mg via INTRAVENOUS

## 2017-07-16 MED ORDER — ONDANSETRON HCL 4 MG/2ML IJ SOLN
4.0000 mg | Freq: Once | INTRAMUSCULAR | Status: DC | PRN
Start: 2017-07-16 — End: 2017-07-16

## 2017-07-16 MED ORDER — HEPARIN SODIUM (PORCINE) 5000 UNIT/ML IJ SOLN
INTRAMUSCULAR | Status: AC
Start: 2017-07-16 — End: ?
  Filled 2017-07-16: qty 1

## 2017-07-16 MED ORDER — FENTANYL CITRATE (PF) 50 MCG/ML IJ SOLN (WRAP)
25.0000 ug | INTRAMUSCULAR | Status: DC | PRN
Start: 2017-07-16 — End: 2017-07-16

## 2017-07-16 MED ORDER — PROPOFOL INFUSION 10 MG/ML
INTRAVENOUS | Status: DC | PRN
Start: 2017-07-16 — End: 2017-07-16
  Administered 2017-07-16: 75 ug/kg/min via INTRAVENOUS

## 2017-07-16 MED ORDER — GELATIN ABSORBABLE 12-7 MM EX MISC
CUTANEOUS | Status: AC
Start: 2017-07-16 — End: ?
  Filled 2017-07-16: qty 1

## 2017-07-16 MED ORDER — LIDOCAINE-EPINEPHRINE 1 %-1:100000 IJ SOLN
INTRAMUSCULAR | Status: AC
Start: 2017-07-16 — End: ?
  Filled 2017-07-16: qty 20

## 2017-07-16 MED ORDER — BUPIVACAINE-EPINEPHRINE (PF) 0.5% -1:200000 IJ SOLN
INTRAMUSCULAR | Status: DC | PRN
Start: 2017-07-16 — End: 2017-07-16
  Administered 2017-07-16: 3.5 mL

## 2017-07-16 MED ORDER — OXYCODONE-ACETAMINOPHEN 5-325 MG PO TABS
1.0000 | ORAL_TABLET | Freq: Once | ORAL | Status: DC | PRN
Start: 2017-07-16 — End: 2017-07-16

## 2017-07-16 MED ORDER — CEFAZOLIN SODIUM 1 G IJ SOLR
INTRAMUSCULAR | Status: AC
Start: 2017-07-16 — End: ?
  Filled 2017-07-16: qty 2000

## 2017-07-16 MED ORDER — LACTATED RINGERS IV SOLN
INTRAVENOUS | Status: DC
Start: 2017-07-16 — End: 2017-07-16

## 2017-07-16 MED ORDER — FENTANYL CITRATE (PF) 50 MCG/ML IJ SOLN (WRAP)
INTRAMUSCULAR | Status: DC | PRN
Start: 2017-07-16 — End: 2017-07-16
  Administered 2017-07-16: 50 ug via INTRAVENOUS

## 2017-07-16 MED ORDER — CEFAZOLIN SODIUM 1 G IJ SOLR
INTRAMUSCULAR | Status: DC | PRN
Start: 2017-07-16 — End: 2017-07-16
  Administered 2017-07-16: 2 g via INTRAVENOUS

## 2017-07-16 MED ORDER — LIDOCAINE-EPINEPHRINE 1 %-1:100000 IJ SOLN
INTRAMUSCULAR | Status: DC | PRN
Start: 2017-07-16 — End: 2017-07-16
  Administered 2017-07-16: 3.5 mL

## 2017-07-16 MED ORDER — ONDANSETRON HCL 4 MG/2ML IJ SOLN
INTRAMUSCULAR | Status: AC
Start: 2017-07-16 — End: ?
  Filled 2017-07-16: qty 2

## 2017-07-16 MED ORDER — ACETAMINOPHEN-CODEINE 300-30 MG PO TABS
1.0000 | ORAL_TABLET | ORAL | 0 refills | Status: DC | PRN
Start: 2017-07-16 — End: 2017-10-04
  Filled 2017-07-16: qty 7, 2d supply, fill #0

## 2017-07-16 MED ORDER — HEPARIN SODIUM (PORCINE) 5000 UNIT/ML IJ SOLN
INTRAMUSCULAR | Status: DC | PRN
Start: 2017-07-16 — End: 2017-07-16
  Administered 2017-07-16: 15:00:00 500 mL

## 2017-07-16 MED ORDER — LACTATED RINGERS IV SOLN
75.0000 mL/h | INTRAVENOUS | Status: DC
Start: 2017-07-16 — End: 2017-07-16

## 2017-07-16 MED ORDER — HYDROMORPHONE HCL 0.5 MG/0.5 ML IJ SOLN
0.2000 mg | INTRAMUSCULAR | Status: DC | PRN
Start: 2017-07-16 — End: 2017-07-16

## 2017-07-16 SURGICAL SUPPLY — 63 items
BANDAGE ESMARK STRL 6INX9FT (Procedure Accessories) IMPLANT
BANDAGE GZE CTTN MED KRLX 3.6YDX6.4IN LF (Dressing)
BANDAGE KERLIX MEDIUM GAUZE L3.6 YD X (Dressing) IMPLANT
CONTAINER SPECIMEN 4 OZ STRL (Procedure Accessories) IMPLANT
COVER FLEXIBLE LIGHT HANDLE PLASTIC GREEN (Procedure Accessories) ×1 IMPLANT
COVER FLEXIBLE MEDLINE LIGHT HANDLE (Procedure Accessories) ×1
COVER LGHT HNDL PLS LF STRL FLXB DISP (Procedure Accessories) ×1
CUFF TOURNIQUET CYLINDRICAL L18 IN X W4 IN 2 PORT BLADDER QUICK (Cuff) IMPLANT
CVR LGHTHNDL FLEX SFT 1PK (Procedure Accessories) ×1
DEVICE INFL 25 ATM INDFLTR 35.5CM DGT (Procedure Accessories)
DEVICE INFLATION L35.5 CM INDEFLATOR 25 (Procedure Accessories)
DEVICE INFLATION L35.5 CM INDEFLATOR 25 ATM DIGITAL ANGIOPLASTY (Procedure Accessories) IMPLANT
DRAPE 74X41IN UNIVERSAL POLY XRAY C ARM CLOSURE STRAP (Drape) IMPLANT
DRAPE EQP VLCR POLY UNV STRDRP 74X41IN (Drape)
DRAPE XRAY C-ARM 27X17X70 (Drape)
ELECTRODE ADULT PATIENT RETURN L9 FT REM POLYHESIVE ACRYLIC FOAM (Procedure Accessories) ×1 IMPLANT
ELECTRODE PATIENT RETURN L9 FT VALLEYLAB (Procedure Accessories) ×1
ELECTRODE PT RTN RM PHSV ACRL FM C30- LB (Procedure Accessories) ×1
FILTER NEPTUNE HEPA FLUID SUCT (Filter) ×1
FILTER SMKEVC NPTN 2 HEPA FLD SCT ACT C (Filter) ×1
FILTER SMOKE EVACUATOR HEPA FLUID (Filter) ×1
FILTER SMOKE EVACUATOR HEPA FLUID SUCTION ACTIVATE CARBON NEPTUNE 2 (Filter) ×1 IMPLANT
GAUZE KRLX STRL 3.4INX3.6YRD (Dressing)
GLOVE SURG BIOGEL LF SZ8 (Glove) ×3 IMPLANT
GUIDEWIRE VASC ANG STD STORQ .035IN 300 (Guidwire)
GUIDEWIRE VASCULAR OD.035 IN L300 CM (Guidwire)
GUIDEWIRE VASCULAR OD.035 IN L300 CM STORQ ANGLE STANDARD STEERABLE (Guidwire) IMPLANT
GW STRBL STD 300CM 0.035IN (Guidwire)
INFLTN DEVICE MONRCH+ANGIO PK (Procedure Accessories)
INTRODUCER MICRPNCT 4F 10CM (Introducer)
INTRODUCER SHEATH OD7 FR L11 CM MINI (Sheaths)
INTRODUCER SHEATH OD7 FR L11 CM MINI GUIDEWIRE AVANTI+ STAINLESS STEEL (Sheaths) IMPLANT
INTRODUCER SHTH SS .038IN STDLN AVNT+ 7 (Sheaths)
KIT INFECTION CONTROL CUSTOM (Kits) ×3
KIT INFECTION CONTROL CUSTOM IFOH03 (Kits) ×1 IMPLANT
PACK AV ~~LOC~~ (Pack) ×3 IMPLANT
PAD ELECTROSRG GRND REM W CRD (Procedure Accessories) ×1
PATCH VASCULAR L9 CM X W2 CM (Graft) ×1 IMPLANT
PATCH VASCULAR L9 CM X W2 CM NONPYROGENIC XENOSURE BOVINE PERICARDIAL (Graft) IMPLANT
PATCH XENOSURE BIOLOGIC 2X9CM (Graft) ×2 IMPLANT
SET ACC NTNL PLTN .018IN MPNCH 4FR 21GA (Introducer)
SET ACCESS L40 CM .018 IN STIFFEN CANNULA COAXIAL CATHETER GUIDEWIRE (Introducer) IMPLANT
SET XTN 3.9ML MEDEX 34IN LF STRL M LL (IV Supply) IMPLANT
SHEATH AVANTI+ W/MW 7F 11CM (Sheaths)
SOL NACL .9% 500ML LATEX (IV Solutions) ×1
SOLUTION IV 0.9% NACL 500ML VFLX LF PLS (IV Solutions) ×1
SOLUTION IV 0.9% SODIUM CHLORIDE 500 ML (IV Solutions) ×1
SOLUTION IV 0.9% SODIUM CHLORIDE 500 ML PLASTIC CONTAINER (IV Solutions) ×1 IMPLANT
STOPCOCK 4-WAY LARGE BORE (IV Supply)
STOPCOCK IV 4 WAY LARGE BORE ROTATE MALE (IV Supply)
STOPCOCK IV 4 WAY LARGE BORE ROTATE MALE LUER LOCK ADAPTER LIPID (IV Supply) IMPLANT
STOPCOCK IV LF STRL 4W LG BORE ROT M LL (IV Supply)
SUTURE NABSB 5-0 C-1 PRLN 36IN 2 ARM MFL (Suture) IMPLANT
SUTURE PROLENE 5-0 C1 C1 36IN (Suture)
SYRINGE 1CC LL DISP (Syringes, Needles) IMPLANT
SYRINGE 20 ML BD LUER-LOK MEDICAL (Syringes, Needles) IMPLANT
SYRINGE LUER LOCK SAFETY 10CC (Syringes, Needles) IMPLANT
SYRINGE LUER LOK W/O NDL 3ML (Syringes, Needles) IMPLANT
SYRINGE LUER-LOK STERILE 20CC (Syringes, Needles)
SYRINGE MED 20ML LL LF STRL (Syringes, Needles)
TOURNIQUET 18IN STRL (Procedure Accessories) ×3 IMPLANT
TOURNIQUET REPRC 18X4IN RED (Cuff)
TUBE IV EXTEN W/ STOPCOCK (IV Supply)

## 2017-07-16 NOTE — Anesthesia Preprocedure Evaluation (Addendum)
Anesthesia Evaluation    AIRWAY    Mallampati: III    TM distance: >3 FB  Neck ROM: full  Mouth Opening:full   CARDIOVASCULAR    regular and normal       DENTAL    no notable dental hx     PULMONARY    clear to auscultation     OTHER FINDINGS              Relevant Problems   No relevant active problems       PSS Anesthesia Comments: MWF HD; Pending K; 2017 ECHO: moderate Pulm hypertension PA pressure 38mmhg; EF 75%; mild-mod AR; CVA , DM, weakness on left side;   Pt is taking aspirin daily including today         Anesthesia Plan    ASA 4     general               (Risks discussed including but not limited to neurological complications such as stroke, cardiovascular complications such as heart attack, pulmonary complications such as asthmatic attack, intra-operative awareness, death, dental trauma, and allergic reaction. Questions answered. Pt understands and wishes to proceed.    Diem Phuc Thuc Banh, DO  07/16/2017)      Detailed anesthesia plan: general IV  Monitors/Adjuncts: other    Post Op: other  Post op pain management: per surgeon    informed consent obtained    Plan discussed with CRNA.                   Signed by: Caffie Pinto 07/16/17 2:31 PM

## 2017-07-16 NOTE — Transfer of Care (Signed)
Anesthesia Transfer of Care Note    Patient: Jacqueline Weber    Procedures performed: Procedure(s):  REVISION, A-V FISTULA UPPER EXTREMITY WITH PATCH ANGIOPLASTY    Anesthesia type: General TIVA    Patient location:Phase II PACU    Last vitals:   Vitals:    07/16/17 1436   BP: 145/61   Pulse: 68   Resp: 18   Temp: (!) 36.1 C (96.9 F)   SpO2: 100%       Post pain: Patient not complaining of pain, continue current therapy      Mental Status:awake    Respiratory Function: tolerating room air    Cardiovascular: stable    Nausea/Vomiting: patient not complaining of nausea or vomiting    Hydration Status: adequate    Post assessment: no apparent anesthetic complications   P/t VSS, spont breathing, taken to phase 2 pacu on RA, attached to monitors, report given to pacu RN, VSS.    Signed by: Renee Beale Elwin Sleight Michaila Kenney  07/16/17 4:13 PM

## 2017-07-16 NOTE — Op Note (Signed)
FULL OPERATIVE NOTE    Date Time: 07/16/17 4:02 PM  Patient Name: Jacqueline Weber  Attending Physician: Cherene Altes, MD      Date of Operation:   07/16/2017    Providers Performing:   Surgeon(s):  Cherene Altes, MD  Herma Ard, MD    Circulator: Eugene Garnet, RN  Relief Circulator: Jeral Pinch, RN  Scrub Person: Geoffery Spruce A  Second Scrub Person: Alesia Richards, RN    Operative Procedure:   Procedure(s):  REVISION, A-V FISTULA UPPER EXTREMITY WITH PATCH ANGIOPLASTY    Preoperative Diagnosis:   Pre-Op Diagnosis Codes:     * Chronic kidney disease, stage V [N18.5]    Postoperative Diagnosis:   Post-Op Diagnosis Codes:     * Chronic kidney disease, stage V [N18.5]    Indications:   Patient has chronic renal failure, status post left proximal radial arteriovenous fistula with area of focal stenosis in the distal cephalic vein.  Therefore, decision was made to proceed with patch angioplasty.  The risks and benefits of the procedures were explained to the patient and agreed. The risk of infection, bleeding, thrombosis, steal syndrome, nerve injury and other complications were explained in detail and agreed.       Operative Notes:   Patient was brought to the operating suite and was placed in supine position.  2 g of Ancef was given intravenously.  Left arm was ultrasounded and there was a high-grade stenosis about 1 cm from the arterial anastomosis.  The left arm was prepped and draped in sterile fashion.  A 2 cm longitudinal incision was made over the cephalic vein at the level of antecubital area.  The tourniquet was applied proximally.  Arm is exsanguinated.  Tourniquet was inflated.  A venotomy was made using an 11 blade and Potts scissors.  Severe high-grade stenosis of 99 percent was encountered.  This was extended proximally and distally into the cephalic vein where it appeared to be a very good size.  This study was extended all the way to the anastomosis.  2 cm pericardial  patch was used with 5-0 Prolene in running fashion.  Patch angioplasty was performed.  Flow was so there was excellent thrill present.  There was a large collateral which was clipped.  Then wound was closed using 3-0 Monocryl in running fashion.  Skin was closed using 4-0 Monocryl in subcutaneous fashion.  Steri-Strips were applied.  Dressings were applied.  Arm was wrapped with Ace bandages.  Patient tolerated the procedure well, was transferred to the recovery room.    Estimated Blood Loss:   10 mL    Implants:     Implant Name Type Inv. Item Serial No. Manufacturer Lot No. LRB No. Used Action   PATCH [place] [employer] [id number] - [id number] Graft PATCH [employer] [id number]   LEMAITRE VASCULAR [id number] Left 1 Implanted       Drains:       Specimens:       Complications:       Signed by: Cherene Altes, MD

## 2017-07-16 NOTE — Discharge Instr - AVS First Page (Addendum)
ACCESS DISCHARGE INSTRUCTIONS    Surgeon: Shary Key MD    Wound Care:  If there is an ACE please remove in AM.  Remove dressing in 48 hours and wet incision site  Remove steri strips in 5 days        Pain Control:  Some pain and swelling is normal after surgery.  You may take the pain medication as prescribed.        Driving:  When pain is controlled and not taking pain medications      Diet:   You may not feel hungry all the time or you may eat a small amount and feel full.  This is ok.  Some people find eating multiple small meals is easier than 3 large meals.  Continue to drink lots of clear fluids.      Reasons to call the doctor:  Call your doctor or go to emergency room if you have any of the following:   Fever greater than 101.5 F   Pus draining from your wound   Redness around your wound that is spreading   You may not drive or do anything requiring coordination or balance for 24 hours.     Avoid heavy lifting for 1 weeks after any surgery.    Persistent bleeding, swelling or pus at the operative site.   Loss of feeling, severe pain, inability to move fingers or cold hand.    Follow up Care:  Call the office [phone] and make an appointment  To have a duplex US of the fistula in my office in 4 weeks and see me.    **Do not use fistula on right arm for dialysis until after follow up with Dr Lutricia Feil and he says it's ok.  Continue to use permacath in chest for dialysis.

## 2017-07-16 NOTE — Anesthesia Postprocedure Evaluation (Signed)
Anesthesia Post Evaluation    Patient: Jacqueline Weber    Procedures performed: Procedure(s):  REVISION, A-V FISTULA UPPER EXTREMITY WITH PATCH ANGIOPLASTY    Anesthesia type: General TIVA    Patient location:Phase II PACU    Last vitals:   Vitals:    07/16/17 1630   BP: 177/80   Pulse: 63   Resp: 17   Temp:    SpO2: 98%       Post pain: Patient not complaining of pain, continue current therapy     Mental Status:awake    Respiratory Function: tolerating face mask    Cardiovascular: stable    Nausea/Vomiting: patient not complaining of nausea or vomiting    Hydration Status: adequate    Post assessment: no apparent anesthetic complications, no reportable events

## 2017-07-16 NOTE — Discharge Instructions (Signed)
Post Anesthesia Discharge Instructions    Although you may be awake and alert in the recovery room, small amounts of anesthetic remain in your system for about 24 hours.  You may feel tired and sleepy during this time.      You are advised to go directly home from the hospital.    Plan to stay at home and rest for the remainder of the day.    It is advisable to have someone with you at home for 24 hours after surgery.    Do not operate a motor vehicle, or any mechanical or electrical equipment for the next 24 hours.      Be careful when you are walking around, you may become dizzy.  The effects of anesthesia and/or medications are still present and drowsiness may occur    Do not consume alcohol, tranquilizers, sleeping medications, or any other non prescribed medication for the remainder of the day.    Diet:  begin with liquids, progress your diet as tolerated or as directed by your surgeon.  Nausea and vomiting may occur in the next 24 hours.    Voiding:  You need to urinate within 6-8 hours of your procedure. If you are unable to pass urine and are uncomfortable go to the nearest emergency department and notify your surgeon.

## 2017-07-16 NOTE — H&P (Signed)
History and Physical currently available, in Epic or on paper, has been reviewed, and there are no major changes. Pt. seen and examined by me prior to procedure.     Renada Cronin A Abhijay Morriss, MD

## 2017-07-17 ENCOUNTER — Encounter: Payer: Self-pay | Admitting: Specialist

## 2017-07-17 ENCOUNTER — Ambulatory Visit (INDEPENDENT_AMBULATORY_CARE_PROVIDER_SITE_OTHER): Payer: Medicare Other | Admitting: Internal Medicine

## 2017-07-24 ENCOUNTER — Ambulatory Visit (FREE_STANDING_LABORATORY_FACILITY): Payer: Medicare Other | Admitting: Internal Medicine

## 2017-07-24 ENCOUNTER — Encounter (INDEPENDENT_AMBULATORY_CARE_PROVIDER_SITE_OTHER): Payer: Self-pay | Admitting: Internal Medicine

## 2017-07-24 VITALS — BP 145/75 | HR 88 | Temp 98.1°F | Ht 58.5 in | Wt 128.0 lb

## 2017-07-24 DIAGNOSIS — N186 End stage renal disease: Secondary | ICD-10-CM

## 2017-07-24 DIAGNOSIS — E1122 Type 2 diabetes mellitus with diabetic chronic kidney disease: Secondary | ICD-10-CM

## 2017-07-24 DIAGNOSIS — Z992 Dependence on renal dialysis: Secondary | ICD-10-CM

## 2017-07-24 NOTE — Progress Notes (Signed)
Subjective:      Date: 07/24/2017 3:36 PM   Patient ID: Tanesia Butner is a 66 y.o. female.    Chief Complaint:  Chief Complaint   Patient presents with   . Diabetes     Follw up     Have you seen any specialists/other providers since your last visit with [place]?    No    Arm preference verified?   Yes    The patient is due for shingles vaccine  HPI:  Diabetes    Follow up Diabetes is classified as type II. Diabetes was diagnosed years ago. Diabetic complications include ESRD on hemodialysis. Comorbid illnesses include hyperlipidemia and hypertension. Patient denies hypoglycemia, blurred vision, chest pain, fatigue, foot paresthesias, foot ulceration, polydipsia, polyphagia, polyuria, weakness, weight loss, dyspnea, lower extremity edema or palpitations. Current therapy includes DPP-4 inhibitor and aspirin. Celesta Gentile Patient is compliant with the current regimen. She monitors blood glucose at home 1-2 x per week. No change in lifestyle has been reported. The patient's exercise routine consists of walking. The patient's last retinal exam was within 1 year. The patient's last microalbumin testing was within 1 year.   BS at home 180.    Problem List:  Patient Active Problem List   Diagnosis   . HTN (hypertension) urgency   . Hyperlipidemia   . History of CVA (cerebrovascular accident)   . Elevated brain natriuretic peptide (BNP) level   . Hyponatremia   . NSTEMI (non-ST elevated myocardial infarction)   . End stage renal disease   . Acute bronchitis   . Anemia in chronic kidney disease (CODE)   . Iron deficiency anemia, unspecified iron deficiency anemia type   . Chronic renal failure, stage 5   . Diabetes mellitus   . Bronchiectasis       Current Medications:  Current Outpatient Prescriptions   Medication Sig Dispense Refill   . acetaminophen (TYLENOL) 325 MG tablet Take 2 tablets (650 mg total) by mouth every 4 (four) hours as needed for Pain or Fever.     . Acetaminophen-Codeine (TYLENOL/CODEINE #3) 300-30 MG per tablet  Take 1 tablet by mouth every 4 (four) hours as needed for Pain. 7 tablet 0   . amLODIPine (NORVASC) 10 MG tablet Take 1 tablet (10 mg total) by mouth daily. (Patient taking differently: Take 10 mg by mouth every morning.    ) 90 tablet 1   . aspirin EC 325 MG EC tablet Take 1 tablet (325 mg total) by mouth daily. 30 tablet 0   . bumetanide (BUMEX) 0.5 MG tablet TAKE ONE TABLET BY MOUTH ONCE DAILY (Patient taking differently: TAKE ONE TABLET BY MOUTH ONCE DAILY-AM) 90 tablet 1   . carvedilol (COREG) 25 MG tablet 12.5 mg 2 (two) times daily with meals.         . Cholecalciferol (VITAMIN D3) 2000 units Tab Take 4,000 IU by mouth daily.     . cloNIDine (CATAPRES) 0.1 MG tablet Take 0.1 mg by mouth.     . ferrous sulfate 324 (65 FE) MG Tablet Delayed Response TAKE ONE TABLET BY MOUTH IN THE MORNING WITH BREAKFAST 90 tablet 1   . fluticasone-salmeterol (ADVAIR DISKUS) 100-50 MCG/DOSE Aerosol Powder, Breath Activtivatede Inhale 1 puff into the lungs 2 (two) times daily. 1 each 0   . hydrALAZINE (APRESOLINE) 100 MG tablet Take 100 mg by mouth 3 (three) times daily.         . ondansetron (ZOFRAN-ODT) 4 MG disintegrating tablet Take 1 tablet (4 mg  total) by mouth every 6 (six) hours as needed for Nausea. 20 tablet 3   . pantoprazole (PROTONIX) 40 MG tablet TAKE ONE TABLET BY MOUTH IN THE MORNING BEFORE BREAKFAST 90 tablet 1   . senna-docusate (PERICOLACE) 8.6-50 MG per tablet Take 2 tablets by mouth 2 (two) times daily. 30 tablet 0   . simvastatin (ZOCOR) 20 MG tablet Take 20 mg by mouth nightly.         [name] SITagliptin (JANUVIA) 25 MG tablet Take 1 tablet (25 mg total) by mouth daily. (Patient taking differently: Take 25 mg by mouth Daily after lunch.    ) 30 tablet 1   . traMADol (ULTRAM) 50 MG tablet Take 50 mg by mouth every 6 (six) hours as needed for Pain.       No current facility-administered medications for this visit.        Allergies:  Allergies   Allergen Reactions   . Mosquito (Culex Pipiens) Allergy Skin Test  Shortness Of Breath     And  Trouble  breathing   . Shellfish Allergy Hives   . Shellfish-Derived Products Hives     New  allergy  New  allergy   . Lisinopril Hives and Rash     Patient cannot remember the exact nature of the allergy   . Penicillins Rash and Swelling     numbness  Swelling,  numbness       Past Medical History:  Past Medical History:   Diagnosis Date   . Anemia 10/02/2016    H/O ANEMIA - REFLECTED ON CURRENT LABS.   [name] Cataracts, bilateral     H/O CATARACTS - SURGERY DONE - STILL HAS SOME ISSUES.   . Cerebrovascular accident 2014    H/O STROKE - WEAKNESS ON LEFT SIDE. NO NEUROLOGIST. USES CANE FOR BALANCE.   [name] Chronic renal failure, stage 5 05/08/2017   . Constipation    . Coronary artery disease 06/2016    H/O QUADRUPLE BYPASS SURGERY - FOLLOWED BY [facility].   . Diabetes mellitus    . Difficulty walking     WALKS WITH CANE. WILL USE WC IN HOSPITAL   . End-stage renal disease 04/2017    DIALYSIS [facility] [place] [facility]. HAS PERMACATH RIGHT CHEST   . Gastroesophageal reflux disease     MED EFFECTIVE    . History of MI (myocardial infarction)    . 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 Hypercholesteremia     CONTROLLED WITH MEDS.   [name] Hypertension     CONTROLLED WITH MEDS.   [name] Myocardial infarction 06/2016   . Pre-diabetes    . Renal insufficiency     CHRONIC KIDNEY DISEASE - FOLLOWED BY DR. Kate Sable.   [name] Shortness of breath     MODERATE EXERTION    . Type 2 diabetes mellitus, controlled     AM FSBS 150-200. INSTRUCTED TO CONTROL DIET CLOSELY TONIGHT.  HAD BEEN OFF JANUVIA, RESTARTED 06/15/17. HA1C 9 06/15/17. MANAGED BY PCP.        Past Surgical History:  Past Surgical History:   Procedure Laterality Date   . BRONCHOSCOPY, FIBEROPTIC N/A 06/21/2017    Procedure: BRONCHOSCOPY with brushing and wash;  Surgeon: Danae Chen, MD;  Location: [facility] OR;  Service: Pulmonary;  Laterality: N/A;   . CORONARY ARTERY BYPASS N/A 06/12/2016    Procedure: Coronary Artery Bypass x 4.;  Surgeon: Bjorn Loser, MD;  [facility];  Service: Cardiothoracic;  Laterality: N/A;  LIMA to LDA  Vein to PDA  Vein to OM  Vein to Diagonal   . EGD, COLONOSCOPY N/A 10/04/2016   . EGD, COLONOSCOPY N/A 10/04/2016    Procedure: EGD w/ bx, COLONOSCOPY w/ bx, polypectomy;  Surgeon: Norton Blizzard, MD;  Location: [facility];  Service: Gastroenterology;  Laterality: N/A;   . ENDOSCOPIC,VEIN HARVEST N/A 06/12/2016    Procedure: Endoscopic, Left Leg Greater Saphenous Vein Harvest;  Surgeon: Bjorn Loser, MD;  Location: [facility] HEART OR;  Service: Cardiothoracic;  Laterality: N/A;  Left Leg Incision (medially, groin to ankle) @ 0742  Vein out of leg @ 0826  Vein prep complete @ 0845  Total time = 63 minutes   . FORMATION, UPPER EXTREMITY, A-V FISTULA Left 05/31/2017    Procedure: FORMATION, UPPER EXTREMITY, A-V FISTULA;  Surgeon: Cherene Altes, MD;  Location: [facility] OR;  Service: Vascular;  Laterality: Left;  LEFT ARM A-V FISTULA     . REVISION, A-V FISTULA UPPER EXTREMITY Left 07/16/2017    Procedure: REVISION, A-V FISTULA UPPER EXTREMITY WITH PATCH ANGIOPLASTY;  Surgeon: Cherene Altes, MD;  Location: [facility];  Service: Vascular;  Laterality: Left;       Family History:  Family History   Problem Relation Age of Onset   . No known problems Mother    . No known problems Father        Social History:  Social History     Social History   . Marital status: Widowed     Spouse name: N/A   . Number of children: N/A   . Years of education: N/A     Occupational History   . Not on file.     Social History Main Topics   . Smoking status: Never Smoker   . Smokeless tobacco: Never Used   . Alcohol use No   . Drug use: No   . Sexual activity: No     Other Topics Concern   . Not on file     Social History Narrative   . No narrative on file       The following sections were reviewed this encounter by the provider:   Tobacco  Allergies  Meds  Problems  Med Hx  Surg Hx  Fam Hx  Soc Hx           Vitals:  BP 145/75 (BP Site: Right arm, Patient Position: Sitting, Cuff Size: Medium)   Pulse 88   Temp 98.1 F (36.7 C) (Oral)   Ht 1.486 m (4' 10.5")   Wt 58.1 kg (128 lb)   SpO2 97%   BMI 26.30 kg/m          ROS:  Review of Systems   Constitutional: Negative for fever.   Respiratory: Negative for cough, shortness of breath and wheezing.    Cardiovascular: Negative for chest pain and palpitations.   Gastrointestinal: Negative for abdominal pain.   Neurological: Negative for weakness.         Objective:       Physical Exam:  General Examination:   GENERAL APPEARANCE: alert, in no acute distress, well developed, well nourished, oriented to time, place, and person.   HEAD: normal appearance, atraumatic.   EYES: extraocular movement intact (EOMI), pupils equal, round, reactive to light and accommodation, sclera anicteric, conjunctiva clear.   ORAL CAVITY: normal oropharynx, normal lips, mucosa moist, no lesions.   THROAT: normal appearance, clear,  no erythema.   NECK/THYROID: neck supple,  no cervical lymphadenopathy, no neck mass palpated, no thyromegaly.   LYMPH NODES: no palpable adenopathy.   SKIN: good turgor, no rashes, no suspicious lesions.   HEART: S1, S2 normal, no murmurs, rubs, gallops, regular rate and rhythm.   LUNGS: normal effort / no distress, normal breath sounds, clear to auscultation bilaterally, no wheezes, rales, rhonchi.   MUSCULOSKELETAL: full range of motion, no swelling or deformity.   EXTREMITIES: no edema,   NEUROLOGIC: nonfocal, cranial nerves 2-12 grossly intact,.   PSYCH: cognitive function intact, mood/affect full range, speech clear.      Assessment/Plan:       1. Type 2 diabetes mellitus with ESRD (end-stage renal disease)  - Hemoglobin A1C  - Endocrinology Referral: Andrez Grime, MD Drue Flirt)  not at goal  Continue [place]  Discussed with endocrine.  As pt is now on dialysis, will need to start insulin.  However, can continue on januvia  Pt will see endocrine  12/13        John Giovanni, MD    `

## 2017-07-25 ENCOUNTER — Encounter (INDEPENDENT_AMBULATORY_CARE_PROVIDER_SITE_OTHER): Payer: Self-pay | Admitting: Internal Medicine

## 2017-07-25 LAB — HEMOGLOBIN A1C
Average Estimated Glucose: 194.4 mg/dL
Hemoglobin A1C: 8.4 % — ABNORMAL HIGH (ref 4.6–5.9)

## 2017-07-31 ENCOUNTER — Ambulatory Visit: Payer: Medicare Other

## 2017-08-07 ENCOUNTER — Other Ambulatory Visit (INDEPENDENT_AMBULATORY_CARE_PROVIDER_SITE_OTHER): Payer: Self-pay | Admitting: Internal Medicine

## 2017-08-09 ENCOUNTER — Ambulatory Visit (INDEPENDENT_AMBULATORY_CARE_PROVIDER_SITE_OTHER): Payer: Medicare Other

## 2017-08-09 DIAGNOSIS — N185 Chronic kidney disease, stage 5: Secondary | ICD-10-CM

## 2017-08-10 NOTE — Procedures (Signed)
Full report will follow into chart

## 2017-08-14 ENCOUNTER — Encounter (INDEPENDENT_AMBULATORY_CARE_PROVIDER_SITE_OTHER): Payer: Self-pay | Admitting: Specialist

## 2017-08-14 ENCOUNTER — Ambulatory Visit (INDEPENDENT_AMBULATORY_CARE_PROVIDER_SITE_OTHER): Payer: Medicare Other | Admitting: Specialist

## 2017-08-14 ENCOUNTER — Ambulatory Visit (INDEPENDENT_AMBULATORY_CARE_PROVIDER_SITE_OTHER): Payer: Medicare Other | Admitting: Internal Medicine

## 2017-08-14 VITALS — BP 96/59 | HR 78 | Ht <= 58 in | Wt 123.5 lb

## 2017-08-14 DIAGNOSIS — N185 Chronic kidney disease, stage 5: Secondary | ICD-10-CM

## 2017-08-14 NOTE — Progress Notes (Signed)
[facility]    Chief Complaint   Patient presents with   . Post-op     dialysis access u/s         History of Present Illness     Jacqueline Weber is a 66 y.o. female who presents Status post left brachiocephalic AV fistula.  She has no complaints    Past Medical History     Past Medical History:   Diagnosis Date   . Anemia 10/02/2016    H/O ANEMIA - REFLECTED ON CURRENT LABS.   [name] Cataracts, bilateral     H/O CATARACTS - SURGERY DONE - STILL HAS SOME ISSUES.   . Cerebrovascular accident 2014    H/O STROKE - WEAKNESS ON LEFT SIDE. NO NEUROLOGIST. USES CANE FOR BALANCE.   [name] Chronic renal failure, stage 5 05/08/2017   . Constipation    . Coronary artery disease 06/2016    H/O QUADRUPLE BYPASS SURGERY - FOLLOWED BY [facility].   . Diabetes mellitus    . Difficulty walking     WALKS WITH CANE. WILL USE WC IN HOSPITAL   . End-stage renal disease 04/2017    DIALYSIS [facility]  MON-WED-FRI. HAS PERMACATH RIGHT CHEST   . Gastroesophageal reflux disease     MED EFFECTIVE    . History of MI (myocardial infarction)    . 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 Hypercholesteremia     CONTROLLED WITH MEDS.   [name] Hypertension     CONTROLLED WITH MEDS.   [name] Myocardial infarction 06/2016   . Pre-diabetes    . Renal insufficiency     CHRONIC KIDNEY DISEASE - FOLLOWED BY DR. Kate Sable.   [name] Shortness of breath     MODERATE EXERTION    . Type 2 diabetes mellitus, controlled     AM FSBS 150-200. INSTRUCTED TO CONTROL DIET CLOSELY TONIGHT.  HAD BEEN OFF JANUVIA, RESTARTED 06/15/17. HA1C 9 06/15/17. MANAGED BY PCP.        Allergies     Allergies   Allergen Reactions   . Mosquito (Culex Pipiens) Allergy Skin Test Shortness Of Breath     And  Trouble  breathing   . Shellfish Allergy Hives   . Shellfish-Derived Products Hives     New  allergy  New  allergy   . Lisinopril Hives and Rash     Patient cannot remember the exact nature of the allergy   . Penicillins Rash and Swelling     numbness  Swelling,  numbness       Medications     Current  Outpatient Prescriptions on File Prior to Visit   Medication Sig Dispense Refill   . acetaminophen (TYLENOL) 325 MG tablet Take 2 tablets (650 mg total) by mouth every 4 (four) hours as needed for Pain or Fever.     . Acetaminophen-Codeine (TYLENOL/CODEINE #3) 300-30 MG per tablet Take 1 tablet by mouth every 4 (four) hours as needed for Pain. 7 tablet 0   . amLODIPine (NORVASC) 10 MG tablet Take 1 tablet (10 mg total) by mouth daily. (Patient taking differently: Take 10 mg by mouth every morning.    ) 90 tablet 1   . aspirin EC 325 MG EC tablet Take 1 tablet (325 mg total) by mouth daily. 30 tablet 0   . bumetanide (BUMEX) 0.5 MG tablet TAKE ONE TABLET BY MOUTH ONCE DAILY (Patient taking differently: TAKE ONE TABLET BY MOUTH ONCE DAILY-AM) 90 tablet 1   .  carvedilol (COREG) 25 MG tablet 12.5 mg 2 (two) times daily with meals.         . Cholecalciferol (VITAMIN D3) 2000 units Tab Take 4,000 IU by mouth daily.     . cloNIDine (CATAPRES) 0.1 MG tablet Take 0.1 mg by mouth.     . ferrous sulfate 324 (65 FE) MG Tablet Delayed Response TAKE 1 TABLET BY MOUTH IN THE MORNING WITH BREAKFAST 90 tablet 1   . fluticasone-salmeterol (ADVAIR DISKUS) 100-50 MCG/DOSE Aerosol Powder, Breath Activtivatede Inhale 1 puff into the lungs 2 (two) times daily. 1 each 0   . hydrALAZINE (APRESOLINE) 100 MG tablet Take 100 mg by mouth 3 (three) times daily.         . ondansetron (ZOFRAN-ODT) 4 MG disintegrating tablet Take 1 tablet (4 mg total) by mouth every 6 (six) hours as needed for Nausea. 20 tablet 3   . pantoprazole (PROTONIX) 40 MG tablet TAKE ONE TABLET BY MOUTH IN THE MORNING BEFORE BREAKFAST 90 tablet 1   . senna-docusate (PERICOLACE) 8.6-50 MG per tablet Take 2 tablets by mouth 2 (two) times daily. 30 tablet 0   . simvastatin (ZOCOR) 20 MG tablet Take 20 mg by mouth nightly.         [name] SITagliptin (JANUVIA) 25 MG tablet Take 1 tablet (25 mg total) by mouth daily. (Patient taking differently: Take 25 mg by mouth Daily after  lunch.    ) 30 tablet 1   . traMADol (ULTRAM) 50 MG tablet Take 50 mg by mouth every 6 (six) hours as needed for Pain.       No current facility-administered medications on file prior to visit.        Review of Systems     Constitutional: Negative for fevers and chills  Skin: No rash or lesions  Respiratory: Negative for cough, wheezing, or hemoptysis  Cardiovascular: as per HPI  Gastrointestinal: Negative for abdominal pain, nausea, vomiting and diarrhea  Musculoskeletal:  No arthritic symptoms  Genitourinary: Negative for dysuria  All other systems were reviewed and are negative except what is stated in the HPI    Physical Exam     Vitals:    08/14/17 0948   BP: 96/59   Pulse: 78       Body mass index is 25.8 kg/m.    General: Patient appears their stated age, well-nourished. Alert and in no apparent distress.  Lungs: Respiratory effort unlabored, chest expansion symmetric.  Cardiac: RRR, no carotid bruits, no JVD.   Extremities warm,   Abd: Soft, nondistended, nontender. No guarding or rebound, No mid line pulsatile mass   ITG:PQDI ROM in all 4 extremities, symmetric , Excellent thrill with well matured left upper arm cephalic vein  Skin: Color appropriate for race, Skin warm, dry, no gangrene, no non healing ulcers, no varicose veins , no hyperpigmentation, no lipo-dermatosclerosis  Neuro: Good insight and judgment, oriented to person, place, and time CN II-XII intact, gross motor and sensory intact      Labs     CBC:   WBC   Date/Time Value Ref Range Status   06/21/2017 08:14 AM 8.24 3.50 - 10.80 x10 3/uL Final     RBC   Date/Time Value Ref Range Status   06/21/2017 08:14 AM 5.49 (H) 4.20 - 5.40 x10 6/uL Final     Hgb   Date/Time Value Ref Range Status   06/21/2017 08:14 AM 11.6 (L) 12.0 - 16.0 g/dL Final     Hematocrit  Date/Time Value Ref Range Status   06/21/2017 08:14 AM 38.7 37.0 - 47.0 % Final     MCV   Date/Time Value Ref Range Status   06/21/2017 08:14 AM 70.5 (L) 80.0 - 100.0 fL Final     MCHC    Date/Time Value Ref Range Status   06/21/2017 08:14 AM 30.0 (L) 32.0 - 36.0 g/dL Final     RDW   Date/Time Value Ref Range Status   06/21/2017 08:14 AM 17 (H) 12 - 15 % Final     Platelets   Date/Time Value Ref Range Status   06/21/2017 08:14 AM 208 140 - 400 x10 3/uL Final       CMP:   Sodium   Date/Time Value Ref Range Status   06/21/2017 08:15 AM 137 136 - 145 mEq/L Final     Potassium   Date/Time Value Ref Range Status   06/21/2017 08:15 AM 5.4 (H) 3.5 - 5.1 mEq/L Final     Chloride   Date/Time Value Ref Range Status   06/21/2017 08:15 AM 100 100 - 111 mEq/L Final     CO2   Date/Time Value Ref Range Status   06/21/2017 08:15 AM 26 22 - 29 mEq/L Final     Glucose   Date/Time Value Ref Range Status   06/21/2017 08:15 AM 201 (H) 70 - 100 mg/dL Final     Comment:     ADA guidelines for diabetes mellitus:  Fasting:  Equal to or greater than 126 mg/dL  Random:   Equal to or greater than 200 mg/dL       BUN   Date/Time Value Ref Range Status   06/21/2017 08:15 AM 38.2 (H) 7.0 - 19.0 mg/dL Final     Protein, Total   Date/Time Value Ref Range Status   03/08/2017 10:05 AM 5.8 (L) 6.0 - 8.3 g/dL Final   06/02/2016 04:21 AM 4.6 (L) 6.0 - 8.3 g/dL Final     Alkaline Phosphatase   Date/Time Value Ref Range Status   03/08/2017 10:05 AM 81 37 - 106 U/L Final     AST (SGOT)   Date/Time Value Ref Range Status   03/08/2017 10:05 AM 23 5 - 34 U/L Final     ALT   Date/Time Value Ref Range Status   03/08/2017 10:05 AM 15 0 - 55 U/L Final     Anion Gap   Date/Time Value Ref Range Status   06/21/2017 08:15 AM 11.0 5.0 - 15.0 Final       Lipid Panel   Cholesterol   Date/Time Value Ref Range Status   10/16/2016 10:34 AM 146 0 - 199 mg/dL Final     Triglycerides   Date/Time Value Ref Range Status   10/16/2016 10:34 AM 65 34 - 149 mg/dL Final     HDL   Date/Time Value Ref Range Status   10/16/2016 10:34 AM 50 40 - 9,999 mg/dL Final     Comment:     An HDL cholesterol <40 mg/dL is low and constitutes a  coronary heart disease risk factor,  and HDL-C>59 mg/dL is  a negative risk factor for CHD.  Ref: American Heart Association; Circulation 2004         Coags:   PT   Date/Time Value Ref Range Status   06/21/2017 08:14 AM 13.0 12.6 - 15.0 sec Final     PT INR   Date/Time Value Ref Range Status   06/21/2017 08:14 AM 1.0 0.9 - 1.1 Final  Comment:     Recommended Ranges for Protime INR:    2.0-3.0 for most medical and surgical thromboembolic states    2.4-4.0 for artificial heart valves  INR result may not represent exact Warfarin dosing level during  the transition period from Heparin to Warfarin therapy.  Recommend close clinical monitoring.       PTT   Date/Time Value Ref Range Status   06/21/2017 08:14 AM 37 23 - 37 sec Final     Comment:     In vivo therapeutic range of heparin (0.3 - 0.7 IU/mL)  correlate with the following APTT times: 64 - 102 seconds.         Assessment and Plan       1. Chronic renal failure, stage 5  [place] Hemodialysis Access Duplex Dopp       Left upper arm brachiocephalic AV fistula has matured very well and it can be used in 2 weeks.  I have given her permission to proceed with the use of it.  I will be seeing her for routine follow-up back in the office in 3 months.    Dr. Kate Sable, I will elect to thank you for allowing me to participate in the care of this patient    With regards    Shary Key, MD, [place], [facility]  Chief, Section of Vascular [facility]

## 2017-08-16 ENCOUNTER — Ambulatory Visit (INDEPENDENT_AMBULATORY_CARE_PROVIDER_SITE_OTHER): Payer: Medicare Other | Admitting: "Endocrinology

## 2017-08-16 ENCOUNTER — Encounter (INDEPENDENT_AMBULATORY_CARE_PROVIDER_SITE_OTHER): Payer: Self-pay | Admitting: "Endocrinology

## 2017-08-16 VITALS — BP 93/59 | HR 80 | Resp 16 | Ht <= 58 in | Wt 131.6 lb

## 2017-08-16 DIAGNOSIS — I10 Essential (primary) hypertension: Secondary | ICD-10-CM

## 2017-08-16 DIAGNOSIS — N186 End stage renal disease: Secondary | ICD-10-CM

## 2017-08-16 DIAGNOSIS — E1122 Type 2 diabetes mellitus with diabetic chronic kidney disease: Secondary | ICD-10-CM

## 2017-08-16 DIAGNOSIS — E785 Hyperlipidemia, unspecified: Secondary | ICD-10-CM

## 2017-08-16 DIAGNOSIS — Z992 Dependence on renal dialysis: Secondary | ICD-10-CM

## 2017-08-16 DIAGNOSIS — I159 Secondary hypertension, unspecified: Secondary | ICD-10-CM

## 2017-08-16 DIAGNOSIS — N184 Chronic kidney disease, stage 4 (severe): Secondary | ICD-10-CM

## 2017-08-16 NOTE — Patient Instructions (Signed)
Check BS twice daily: fasting and 2 hours after dinner     Your blood sugar target goals are     A1c* < 7.0%   Fasting Glucose Level 80 -130 mg/dl    2 hours After Meal Glucose Level 180 - 190 mg/dl     Increase Januvia 25 mg to 2 tablets daily.

## 2017-08-16 NOTE — Progress Notes (Signed)
Subjective:      Date: 08/16/2017 1:31 PM   Patient ID: Jacqueline Weber is a 66 y.o. female.    Chief Complaint:  Chief Complaint   Patient presents with   . Diabetes       HPI    Visit type: initial eval - existing diagnosis  Type: Type II and non-insulin requiring  Evaluation: diagnosed years ago  Last follow-up: weeks ago with PCP, Dr. Camila Li     Diabetes Medication Regimen: Januvia 25 mg daily   Medication effectiveness/adherence: partially effective and general adherence with medications  Medication side effects: none  Presenting symptoms at time of diagnosis: random glucose >200 mg/dL  Prior medications: Metformin   Current symptoms: none  Patient denies: no other symptoms including chest pain, shortness of breath, hypoglycemic episodes, polydipsia, polyuria, headaches, dizziness, or abdominal pain  Exercise: walking  Diet: inconsistent compliance with and recommended diet  Complications: ESRD on hemodialysis - started hemodialysis 2 months ago   Pertinent medical history: CAD, hyperlipidemia and stroke, ESRD on HD   Home glucose readings: Recall fasting 180s  A1c result: 8.4 (07/25/17)   A1c trend is increasing  Microalbumin trend: No results found for: MICROALBUMIN - n/a due to ESRD on HD   Microalbumin trend is UTD   LDL trend:   Lab Results   Component Value Date    LDL 83 10/16/2016    LDL 122 (H) 08/07/2016    LDL 108 (H) 05/16/2016     LDL trend is utd  Additional concerns: In addition, pt has hyperlipidemia and is compliant with lipid therapy.  Pt denies side effects of lipid therapy - specifically denies myalgia. Has HTN, no evidence of CHF and is compliant with medications; hx of stroke 4-5 years ago with no residual neuro deficits; Had quadruple bypass 1 year ago, currently no anginal symptoms; developed ESRD and started hemodialysis 2 months ago, dialysis days MWF.     Stopped Januvia for a while and restarted 2 months ago     Last eye exam was May 29, 2017 - no known retinopathy.     Hx  of stroke 4 to 5 years ago, no neuro deficit.     Problem List:  Patient Active Problem List   Diagnosis   . HTN (hypertension) urgency   . Hyperlipidemia   . History of CVA (cerebrovascular accident)   . Elevated brain natriuretic peptide (BNP) level   . Hyponatremia   . NSTEMI (non-ST elevated myocardial infarction)   . End stage renal disease   . Acute bronchitis   . Anemia in chronic kidney disease (CODE)   . Iron deficiency anemia, unspecified iron deficiency anemia type   . Chronic renal failure, stage 5   . Diabetes mellitus   . Bronchiectasis       Current Medications:  Current Outpatient Prescriptions   Medication Sig Dispense Refill   . acetaminophen (TYLENOL) 325 MG tablet Take 2 tablets (650 mg total) by mouth every 4 (four) hours as needed for Pain or Fever.     . Acetaminophen-Codeine (TYLENOL/CODEINE #3) 300-30 MG per tablet Take 1 tablet by mouth every 4 (four) hours as needed for Pain. 7 tablet 0   . amLODIPine (NORVASC) 10 MG tablet Take 1 tablet (10 mg total) by mouth daily. (Patient taking differently: Take 10 mg by mouth every morning.    ) 90 tablet 1   . aspirin EC 325 MG EC tablet Take 1 tablet (325 mg total) by mouth daily.  30 tablet 0   . bumetanide (BUMEX) 0.5 MG tablet TAKE ONE TABLET BY MOUTH ONCE DAILY (Patient taking differently: TAKE ONE TABLET BY MOUTH ONCE DAILY-AM) 90 tablet 1   . carvedilol (COREG) 25 MG tablet 12.5 mg 2 (two) times daily with meals.         . Cholecalciferol (VITAMIN D3) 2000 units Tab Take 4,000 IU by mouth daily.     . cloNIDine (CATAPRES) 0.1 MG tablet Take 0.1 mg by mouth.     . ferrous sulfate 324 (65 FE) MG Tablet Delayed Response TAKE 1 TABLET BY MOUTH IN THE MORNING WITH BREAKFAST 90 tablet 1   . fluticasone-salmeterol (ADVAIR DISKUS) 100-50 MCG/DOSE Aerosol Powder, Breath Activtivatede Inhale 1 puff into the lungs 2 (two) times daily. 1 each 0   . hydrALAZINE (APRESOLINE) 100 MG tablet Take 100 mg by mouth 3 (three) times daily.         . ondansetron  (ZOFRAN-ODT) 4 MG disintegrating tablet Take 1 tablet (4 mg total) by mouth every 6 (six) hours as needed for Nausea. 20 tablet 3   . pantoprazole (PROTONIX) 40 MG tablet TAKE ONE TABLET BY MOUTH IN THE MORNING BEFORE BREAKFAST 90 tablet 1   . senna-docusate (PERICOLACE) 8.6-50 MG per tablet Take 2 tablets by mouth 2 (two) times daily. 30 tablet 0   . simvastatin (ZOCOR) 20 MG tablet Take 20 mg by mouth nightly.         [name] SITagliptin (JANUVIA) 25 MG tablet Take 1 tablet (25 mg total) by mouth daily. (Patient taking differently: Take 25 mg by mouth Daily after lunch.    ) 30 tablet 1   . traMADol (ULTRAM) 50 MG tablet Take 50 mg by mouth every 6 (six) hours as needed for Pain.     [name] levoFLOXacin (LEVAQUIN) 500 MG tablet daily.     . metroNIDAZOLE (FLAGYL) 500 MG tablet daily.     . montelukast (SINGULAIR) 10 MG tablet as needed.     [name] RENVELA 800 MG tablet daily.       No current facility-administered medications for this visit.        Allergies:  Allergies   Allergen Reactions   . Mosquito (Culex Pipiens) Allergy Skin Test Shortness Of Breath     And  Trouble  breathing   . Shellfish Allergy Hives   . Shellfish-Derived Products Hives     New  allergy  New  allergy   . Lisinopril Hives and Rash     Patient cannot remember the exact nature of the allergy   . Penicillins Rash and Swelling     numbness  Swelling,  numbness       Past Medical History:  Past Medical History:   Diagnosis Date   . Anemia 10/02/2016    H/O ANEMIA - REFLECTED ON CURRENT LABS.   [name] Cataracts, bilateral     H/O CATARACTS - SURGERY DONE - STILL HAS SOME ISSUES.   . Cerebrovascular accident 2014    H/O STROKE - WEAKNESS ON LEFT SIDE. NO NEUROLOGIST. USES CANE FOR BALANCE.   [name] Chronic renal failure, stage 5 05/08/2017   . Constipation    . Coronary artery disease 06/2016    H/O QUADRUPLE BYPASS SURGERY - FOLLOWED BY [facility].   . Diabetes mellitus    . Difficulty walking     WALKS WITH CANE. WILL USE WC IN HOSPITAL   . End-stage renal disease 04/2017     DIALYSIS [facility] [place] MON-WED-FRI.  HAS PERMACATH RIGHT CHEST   . Gastroesophageal reflux disease     MED EFFECTIVE    . History of MI (myocardial infarction)    . 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 Hypercholesteremia     CONTROLLED WITH MEDS.   [name] Hypertension     CONTROLLED WITH MEDS.   [name] Myocardial infarction 06/2016   . Pre-diabetes    . Renal insufficiency     CHRONIC KIDNEY DISEASE - FOLLOWED BY DR. Kate Sable.   [name] Shortness of breath     MODERATE EXERTION    . Type 2 diabetes mellitus, controlled     AM FSBS 150-200. INSTRUCTED TO CONTROL DIET CLOSELY TONIGHT.  HAD BEEN OFF JANUVIA, RESTARTED 06/15/17. HA1C 9 06/15/17. MANAGED BY PCP.        Past Surgical History:  Past Surgical History:   Procedure Laterality Date   . BRONCHOSCOPY, FIBEROPTIC N/A 06/21/2017    Procedure: BRONCHOSCOPY with brushing and wash;  Surgeon: Danae Chen, MD;  Location: [place] [facility] OR;  Service: Pulmonary;  Laterality: N/A;   . CORONARY ARTERY BYPASS N/A 06/12/2016    Procedure: Coronary Artery Bypass x 4.;  Surgeon: Bjorn Loser, MD;  Location: [facility] HE[facility] OR;  Service: Cardiothoracic;  Laterality: N/A;  LIMA to LDA  Vein to PDA  Vein to OM  Vein to Diagonal   . EGD, COLONOSCOPY N/A 10/04/2016   . EGD, COLONOSCOPY N/A 10/04/2016    Procedure: EGD w/ bx, COLONOSCOPY w/ bx, polypectomy;  Surgeon: Norton Blizzard, MD;  Location: [place] [facility] OR;  Service: Gastroenterology;  Laterality: N/A;   . ENDOSCOPIC,VEIN HARVEST N/A 06/12/2016    Procedure: Endoscopic, Left Leg Greater Saphenous Vein Harvest;  Surgeon: Bjorn Loser, MD;  Location[facility];  Service: Cardiothoracic;  Laterality: N/A;  Left Leg Incision (medially, groin to ankle) @ 0742  Vein out of leg @ 0826  Vein prep complete @ 0845  Total time = 63 minutes   . FORMATION, UPPER EXTREMITY, A-V FISTULA Left 05/31/2017    Procedure: FORMATION, UPPER EXTREMITY, A-V FISTULA;  Surgeon: Cherene Altes, MD;  Location: [place] MAIN OR;  Service:  Vascular;  Laterality: Left;  LEFT ARM A-V FISTULA     . REVISION, A-V FISTULA UPPER EXTREMITY Left 07/16/2017    Procedure: REVISION, A-V FISTULA UPPER EXTREMITY WITH PATCH ANGIOPLASTY;  Surgeon: Cherene Altes, MD;  Location:  MAIN OR;  Service: Vascular;  Laterality: Left;       Family History:  Family History   Problem Relation Age of Onset   . No known problems Mother    . No known problems Father        Social History:  Social History     Social History   . Marital status: Widowed     Spouse name: N/A   . Number of children: N/A   . Years of education: N/A     Occupational History   . Not on file.     Social History Main Topics   . Smoking status: Never Smoker   . Smokeless tobacco: Never Used   . Alcohol use No   . Drug use: No   . Sexual activity: No     Other Topics Concern   . Not on file     Social History Narrative   . No narrative on file       The following sections were reviewed this encounter by  the provider:   Tobacco  Allergies  Meds  Problems  Med Hx  Surg Hx  Fam Hx  Soc Hx          Vitals:  BP 93/59 (BP Site: Left arm, Patient Position: Sitting, Cuff Size: Medium)   Pulse 80   Resp 16   Ht 1.473 m (4\' 10" )   Wt 59.7 kg (131 lb 9.6 oz)   BMI 27.50 kg/m      ROS: A complete 12 point ROS was obtained. Pertinent positives and negatives as noted in HPI. All other systems are negative.    Physical Examination     General appearance: alert, appears stated age, cooperative and no distress  Eyes: conjunctivae/corneas clear. PERRL, EOM's intact.   Neck: no cervical lymphadenopathy, no carotid bruit,  supple, symmetrical, trachea midline   Thyroid:normal sized on inspection; non tender to palpation and no discrete nodules palpated  Lungs: clear to auscultation bilaterally  Heart: regular rate and rhythm, S1, S2 normal, no murmur, click, rub or gallop  Abdomen: soft, non-tender; bowel sounds normal; no masses,  no organomegaly  Extremities: extremities normal, atraumatic, no  cyanosis or edema  Foot Exam: monofilament testing of plantar aspect of first toe and metatarsal joints (10g monofilament) intact bilaterally, no fissures, maceration, ulceration or lesions bilaterally, normal hair growth bilaterally, normal pigmentation bilaterally and vibratory perception (128 Hz tuning fork) applied to first toe intact bilaterally; Pedal pulses 2+ bilaterally  Skin: Skin color, texture, turgor normal. No rashes or lesions  Neurologic: Alert and oriented X 3, normal strength and tone. Normal symmetric reflexes. No tremor    Assessment and Plan     1. Diabetes type 2 with kidney complications     -   Lab Results   Component Value Date    HGBA1C 8.4 (H) 07/24/2017    HGBA1C 9.0 (A) 06/15/2017    HGBA1C 6.2 (H) 02/13/2017       Current A1C is elevated above goal.  Recommend increasing the dose of DPP4-Inhibitor therapy. Side effects of DPP4-inhibitors (URI, UTI, headache, abdominal pain, hypoglycemia, pancreatitis) discussed with patient. Recommend home glucometer testing twice a day (pre-breakfast or 2 hrs post-prandial).  Recommend therapeutic lifestyle changes which include optimizing low carbohydrate diet and aerobic exercise efforts.    - increase Januvia to 50 mg daily     2. Hyperlipidemia  - reviewed most recent lipid panel result, LDL at goal < 100    - on statin and well tolerated    3. Hypertension  - well controlled   - on Amlodipine 10 mg daily, Carvedilol 25 mg BID, Clonidine 0.1 mg daily, Hydralazine 100 mg TID    4. Stroke   - no residual neuro deficits  - on Aspirin and statin for secondary stroke prevention    5. ESRD on HD  - currently has temporary access site, permacath to chest which was intact and signs of bleeding  - tolerating HD without complications    RTC in 6 to 8 weeks     Ernesto Rutherford MSN FNP-BC  [facility]

## 2017-08-22 ENCOUNTER — Other Ambulatory Visit (INDEPENDENT_AMBULATORY_CARE_PROVIDER_SITE_OTHER): Payer: Self-pay | Admitting: Internal Medicine

## 2017-08-22 DIAGNOSIS — E1122 Type 2 diabetes mellitus with diabetic chronic kidney disease: Secondary | ICD-10-CM

## 2017-08-23 ENCOUNTER — Telehealth (INDEPENDENT_AMBULATORY_CARE_PROVIDER_SITE_OTHER): Payer: Self-pay

## 2017-08-23 NOTE — Telephone Encounter (Signed)
PA initiated on covermymeds for Januvia. Awaiting response from insurance company.

## 2017-08-23 NOTE — Telephone Encounter (Signed)
PA for Januvia was approved. Coverage approved from 05/25/17-08/23/18

## 2017-08-24 ENCOUNTER — Encounter (INDEPENDENT_AMBULATORY_CARE_PROVIDER_SITE_OTHER): Payer: Self-pay | Admitting: "Endocrinology

## 2017-09-12 ENCOUNTER — Encounter (INDEPENDENT_AMBULATORY_CARE_PROVIDER_SITE_OTHER): Payer: Self-pay

## 2017-09-18 ENCOUNTER — Encounter (INDEPENDENT_AMBULATORY_CARE_PROVIDER_SITE_OTHER): Payer: Self-pay

## 2017-10-02 ENCOUNTER — Ambulatory Visit (INDEPENDENT_AMBULATORY_CARE_PROVIDER_SITE_OTHER): Payer: Medicare Other | Admitting: "Endocrinology

## 2017-10-04 ENCOUNTER — Encounter (INDEPENDENT_AMBULATORY_CARE_PROVIDER_SITE_OTHER): Payer: Self-pay | Admitting: "Endocrinology

## 2017-10-04 ENCOUNTER — Ambulatory Visit (INDEPENDENT_AMBULATORY_CARE_PROVIDER_SITE_OTHER): Payer: Medicare Other | Admitting: "Endocrinology

## 2017-10-04 VITALS — BP 99/62 | HR 75 | Resp 16 | Ht <= 58 in | Wt 130.2 lb

## 2017-10-04 DIAGNOSIS — Z992 Dependence on renal dialysis: Secondary | ICD-10-CM

## 2017-10-04 DIAGNOSIS — I159 Secondary hypertension, unspecified: Secondary | ICD-10-CM

## 2017-10-04 DIAGNOSIS — N186 End stage renal disease: Secondary | ICD-10-CM

## 2017-10-04 DIAGNOSIS — E1122 Type 2 diabetes mellitus with diabetic chronic kidney disease: Secondary | ICD-10-CM

## 2017-10-04 DIAGNOSIS — E785 Hyperlipidemia, unspecified: Secondary | ICD-10-CM

## 2017-10-04 DIAGNOSIS — I120 Hypertensive chronic kidney disease with stage 5 chronic kidney disease or end stage renal disease: Secondary | ICD-10-CM

## 2017-10-04 MED ORDER — ONETOUCH ULTRA BLUE VI STRP
ORAL_STRIP | 3 refills | Status: DC
Start: 2017-10-04 — End: 2018-08-26

## 2017-10-04 MED ORDER — LINAGLIPTIN 5 MG PO TABS
5.0000 mg | ORAL_TABLET | Freq: Every day | ORAL | 1 refills | Status: DC
Start: 2017-10-04 — End: 2018-01-23

## 2017-10-04 NOTE — Patient Instructions (Addendum)
Stop Januvia    Start Tradjenta 5 mg daily.       Check BS twice daily: fasting and 2 hours after dinner     Your blood sugar target goals are     A1c* < 7.0%   Fasting Glucose Level 80 -130 mg/dl    2 hours After Meal Glucose Level 180 - 190 mg/dl     Increase Januvia 25 mg to 2 tablets daily.

## 2017-10-04 NOTE — Progress Notes (Signed)
Subjective:      Date: 10/04/2017 10:34 AM   Patient ID: Jacqueline Weber is a 67 y.o. female.    Chief Complaint:  Chief Complaint   Patient presents with   . Diabetes       HPI    Visit type: follow up  Type: Type II and non-insulin requiring  Evaluation: diagnosed years ago  Last follow-up: a month ago     Diabetes Medication Regimen: Januvia 25 mg daily   Medication effectiveness/adherence: partially effective and general adherence with medications  Medication side effects: none  Presenting symptoms at time of diagnosis: random glucose >200 mg/dL  Prior medications: Metformin   Current symptoms: none  Patient denies: no other symptoms including chest pain, shortness of breath, hypoglycemic episodes, polydipsia, polyuria, headaches, dizziness, or abdominal pain  Exercise: walking  Diet: inconsistent compliance with and recommended diet  Complications: ESRD on hemodialysis - started hemodialysis October 2018   Pertinent medical history: CAD, hyperlipidemia and stroke, ESRD on HD   Home glucose readings: Recall 180s  A1c result: 8.4 (07/25/17)   A1c trend is increasing  Microalbumin trend: No results found for: MICROALBUMIN - n/a due to ESRD on HD   Microalbumin trend is UTD   LDL trend:   Lab Results   Component Value Date    LDL 83 10/16/2016    LDL 122 (H) 08/07/2016    LDL 108 (H) 05/16/2016     LDL trend is decreasing  Additional concerns: In addition, pt has hyperlipidemia and is compliant with lipid therapy.  Pt denies side effects of lipid therapy - specifically denies myalgia. Has HTN, no evidence of CHF and is compliant with medications; hx of stroke 4-5 years ago with no residual neuro deficits; Had quadruple bypass 1 year ago, currently no anginal symptoms; developed ESRD and started hemodialysis October 2018, dialysis days MWF.     Last eye exam was May 29, 2017 - no known retinopathy.     Hx of stroke 4 to 5 years ago, no neuro deficit.     Problem List:  Patient Active Problem List   Diagnosis    . HTN (hypertension) urgency   . Hyperlipidemia   . History of CVA (cerebrovascular accident)   . Elevated brain natriuretic peptide (BNP) level   . Hyponatremia   . NSTEMI (non-ST elevated myocardial infarction)   . End stage renal disease   . Acute bronchitis   . Anemia in chronic kidney disease (CODE)   . Iron deficiency anemia, unspecified iron deficiency anemia type   . Chronic renal failure, stage 5   . Diabetes mellitus   . Bronchiectasis       Current Medications:  Current Outpatient Prescriptions   Medication Sig Dispense Refill   . amLODIPine (NORVASC) 10 MG tablet Take 1 tablet (10 mg total) by mouth daily. (Patient taking differently: Take 10 mg by mouth every morning.    ) 90 tablet 1   . aspirin EC 325 MG EC tablet Take 1 tablet (325 mg total) by mouth daily. 30 tablet 0   . bumetanide (BUMEX) 0.5 MG tablet TAKE ONE TABLET BY MOUTH ONCE DAILY (Patient taking differently: TAKE ONE TABLET BY MOUTH ONCE DAILY-AM) 90 tablet 1   . carvedilol (COREG) 25 MG tablet 12.5 mg 2 (two) times daily with meals.         . Cholecalciferol (VITAMIN D3) 2000 units Tab Take 4,000 IU by mouth daily.     . cloNIDine (CATAPRES) 0.1 MG tablet  Take 0.1 mg by mouth.     . ferrous sulfate 324 (65 FE) MG Tablet Delayed Response TAKE 1 TABLET BY MOUTH IN THE MORNING WITH BREAKFAST 90 tablet 1   . fluticasone-salmeterol (ADVAIR DISKUS) 100-50 MCG/DOSE Aerosol Powder, Breath Activtivatede Inhale 1 puff into the lungs 2 (two) times daily. 1 each 0   . montelukast (SINGULAIR) 10 MG tablet as needed.     . pantoprazole (PROTONIX) 40 MG tablet TAKE ONE TABLET BY MOUTH IN THE MORNING BEFORE BREAKFAST 90 tablet 1   . RENVELA 800 MG tablet daily.     . simvastatin (ZOCOR) 20 MG tablet Take 20 mg by mouth nightly.         [name] SITagliptin (JANUVIA) 25 MG tablet Take 2 tablets (50 mg total) by mouth daily. 180 tablet 1   . traMADol (ULTRAM) 50 MG tablet Take 50 mg by mouth every 6 (six) hours as needed for Pain.       No current  facility-administered medications for this visit.        Allergies:  Allergies   Allergen Reactions   . Mosquito (Culex Pipiens) Allergy Skin Test Shortness Of Breath     And  Trouble  breathing   . Shellfish Allergy Hives   . Shellfish-Derived Products Hives     New  allergy  New  allergy   . Lisinopril Hives and Rash     Patient cannot remember the exact nature of the allergy   . Penicillins Rash and Swelling     numbness  Swelling,  numbness       Past Medical History:  Past Medical History:   Diagnosis Date   . Anemia 10/02/2016    H/O ANEMIA - REFLECTED ON CURRENT LABS.   [name] Cataracts, bilateral     H/O CATARACTS - SURGERY DONE - STILL HAS SOME ISSUES.   . Cerebrovascular accident 2014    H/O STROKE - WEAKNESS ON LEFT SIDE. NO NEUROLOGIST. USES CANE FOR BALANCE.   [name] Chronic renal failure, stage 5 05/08/2017   . Constipation    . Coronary artery disease 06/2016    H/O QUADRUPLE BYPASS SURGERY - FOLLOWED BY [facility].   . Diabetes mellitus    . Difficulty walking     WALKS WITH CANE. WILL USE WC IN HOSPITAL   . End-stage renal disease 04/2017    DIALYSIS [facility] [place] MON-WED-FRI. HAS PERMACATH RIGHT CHEST   . Gastroesophageal reflux disease     MED EFFECTIVE    . History of MI (myocardial infarction)    . 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 Hypercholesteremia     CONTROLLED WITH MEDS.   [name] Hypertension     CONTROLLED WITH MEDS.   [name] Myocardial infarction 06/2016   . Pre-diabetes    . Renal insufficiency     CHRONIC KIDNEY DISEASE - FOLLOWED BY DR. Kate Sable.   [name] Shortness of breath     MODERATE EXERTION    . Type 2 diabetes mellitus, controlled     AM FSBS 150-200. INSTRUCTED TO CONTROL DIET CLOSELY TONIGHT.  HAD BEEN OFF JANUVIA, RESTARTED 06/15/17. HA1C 9 06/15/17. MANAGED BY PCP.        Past Surgical History:  Past Surgical History:   Procedure Laterality Date   . BRONCHOSCOPY, FIBEROPTIC N/A 06/21/2017    Procedure: BRONCHOSCOPY with brushing and wash;  Surgeon: Danae Chen, MD;  Location: [place] [facility]  OR;  Service: Pulmonary;  Laterality:  N/A;   . CORONARY ARTERY BYPASS N/A 06/12/2016    Procedure: Coronary Artery Bypass x 4.;  Surgeon: Bjorn Loser, MD;  Location: [facility] HEART OR;  Service: Cardiothoracic;  Laterality: N/A;  LIMA to LDA  Vein to PDA  Vein to OM  Vein to Diagonal   . EGD, COLONOSCOPY N/A 10/04/2016   . EGD, COLONOSCOPY N/A 10/04/2016    Procedure: EGD w/ bx, COLONOSCOPY w/ bx, polypectomy;  Surgeon: Norton Blizzard, MD;  Location: [place] [facility] OR;  Service: Gastroenterology;  Laterality: N/A;   . ENDOSCOPIC,VEIN HARVEST N/A 06/12/2016    Procedure: Endoscopic, Left Leg Greater Saphenous Vein Harvest;  Surgeon: Bjorn Loser, MD;  Location: [facility] OR;  Service: Cardiothoracic;  Laterality: N/A;  Left Leg Incision (medially, groin to ankle) @ 0742  Vein out of leg @ 0826  Vein prep complete @ 0845  Total time = 63 minutes   . FORMATION, UPPER EXTREMITY, A-V FISTULA Left 05/31/2017    Procedure: FORMATION, UPPER EXTREMITY, A-V FISTULA;  Surgeon: Cherene Altes, MD;  Location: [place] MAIN OR;  Service: Vascular;  Laterality: Left;  LEFT ARM A-V FISTULA     . REVISION, A-V FISTULA UPPER EXTREMITY Left 07/16/2017    Procedure: REVISION, A-V FISTULA UPPER EXTREMITY WITH PATCH ANGIOPLASTY;  Surgeon: Cherene Altes, MD;  Location: [place] MAIN OR;  Service: Vascular;  Laterality: Left;       Family History:  Family History   Problem Relation Age of Onset   . No known problems Mother    . No known problems Father        Social History:  Social History     Social History   . Marital status: Widowed     Spouse name: N/A   . Number of children: N/A   . Years of education: N/A     Occupational History   . Not on file.     Social History Main Topics   . Smoking status: Never Smoker   . Smokeless tobacco: Never Used   . Alcohol use No   . Drug use: No   . Sexual activity: No     Other Topics Concern   . Not on file     Social History Narrative   . No narrative on file       The following  sections were reviewed this encounter by the provider:   Tobacco  Allergies  Meds  Med Hx  Surg Hx  Fam Hx  Soc Hx        Vitals:  BP 99/62 (BP Site: Left arm, Patient Position: Sitting, Cuff Size: Medium)   Pulse 75   Resp 16   Ht 1.473 m (4\' 10" )   Wt 59.1 kg (130 lb 3.2 oz)   BMI 27.21 kg/m      ROS: A complete 12 point ROS was obtained. Pertinent positives and negatives as noted in HPI. All other systems are negative.    Physical Examination     General appearance: alert, appears stated age, cooperative and no distress  Eyes: conjunctivae/corneas clear. PERRL, EOM's intact.   Neck: no cervical lymphadenopathy, no carotid bruit,  supple, symmetrical, trachea midline   Thyroid:normal sized on inspection; non tender to palpation and no discrete nodules palpated  Lungs: clear to auscultation bilaterally  Heart: regular rate and rhythm, S1, S2 normal, no murmur, click, rub or gallop  Abdomen: soft, non-tender; bowel sounds normal; no masses,  no organomegaly  Extremities:  extremities normal, atraumatic, no cyanosis or edema  Foot Exam: monofilament testing of plantar aspect of first toe and metatarsal joints (10g monofilament) intact bilaterally, no fissures, maceration, ulceration or lesions bilaterally, normal hair growth bilaterally, normal pigmentation bilaterally and vibratory perception (128 Hz tuning fork) applied to first toe intact bilaterally; Pedal pulses 2+ bilaterally  Skin: Skin color, texture, turgor normal. No rashes or lesions  Neurologic: Alert and oriented X 3, normal strength and tone. Normal symmetric reflexes. No tremor    Assessment and Plan     1. Diabetes type 2 with kidney complications     -   Lab Results   Component Value Date    HGBA1C 8.4 (H) 07/24/2017    HGBA1C 9.0 (A) 06/15/2017    HGBA1C 6.2 (H) 02/13/2017       Current A1C is elevated above goal. Verbally reports BS in the 180s.  POCT fasting BS 144.  due to ESRD, switch to Tradjenta 5 mg daily Recommend home glucometer  testing twice a day (pre-breakfast or 2 hrs post-prandial). check BS 2 hours after dinner.  Recommend therapeutic lifestyle changes which include optimizing low carbohydrate diet and aerobic exercise efforts.    - stop Januvia  - Start Tradjenta 5 mg daily  - will add to regimen if BS are above target goal     2. Hyperlipidemia  -  LDL at goal < 100    - on statin and well tolerated    3. Hypertension  - well controlled   - on Amlodipine 10 mg daily, Carvedilol 25 mg BID, Clonidine 0.1 mg daily, Hydralazine 100 mg TID    4. Stroke   - no residual neuro deficits  - on Aspirin and statin for secondary stroke prevention    5. ESRD on HD  - currently has temporary access site, permacath to chest which was intact and signs of bleeding  - tolerating HD without complications  - dialysis days MWF     RTC in 6 weeks     Ernesto Rutherford MSN FNP-BC  [facility]

## 2017-10-13 ENCOUNTER — Encounter (INDEPENDENT_AMBULATORY_CARE_PROVIDER_SITE_OTHER): Payer: Self-pay

## 2017-10-16 ENCOUNTER — Encounter (INDEPENDENT_AMBULATORY_CARE_PROVIDER_SITE_OTHER): Payer: Self-pay | Admitting: Internal Medicine

## 2017-10-16 ENCOUNTER — Ambulatory Visit (INDEPENDENT_AMBULATORY_CARE_PROVIDER_SITE_OTHER): Payer: Medicare Other | Admitting: Internal Medicine

## 2017-10-16 VITALS — BP 114/69 | HR 102 | Temp 98.6°F | Resp 89 | Wt 141.0 lb

## 2017-10-16 DIAGNOSIS — E118 Type 2 diabetes mellitus with unspecified complications: Secondary | ICD-10-CM

## 2017-10-16 DIAGNOSIS — I10 Essential (primary) hypertension: Secondary | ICD-10-CM

## 2017-10-16 DIAGNOSIS — I252 Old myocardial infarction: Secondary | ICD-10-CM

## 2017-10-16 DIAGNOSIS — Z23 Encounter for immunization: Secondary | ICD-10-CM

## 2017-10-16 NOTE — Progress Notes (Signed)
Patient presented to the office for PCV13 administration.  Received injection in the Right arm.  No reaction was noted and patient left in good condition.

## 2017-10-16 NOTE — Progress Notes (Signed)
Subjective:      Date: 10/16/2017 2:11 PM   Patient ID: Jacqueline Weber is a 67 y.o. female.    Chief Complaint:  Chief Complaint   Patient presents with   . Follow-up       HPI:  Hypertension   The patient is being seen for a routine follow-up of hypertension. Hypertension is classified as essential. Hypertensive end-organ manifestations include CKD-Stage 5. Comorbid illnesses include hyperlipidemia, diabetes, end-stage renal disease and prior CVA. There are no recent interval events.  There is no interval history of chest pain, dyspnea, fatigue and palpitations.  Current therapy includes beta blockers, calcium channel blockers and diuretics. Clonidine   Patient is compliant with the current regimen.   The patient's blood pressure response to medications is overcorrected. Average ambulatory systolic blood pressure is generally in the 90's.  ESRD  Pt goes to dialysis 3 times a week  Diabetes  Pt now seeing endocrinologist  BS in 140's  H/o NSTEMI  Pt has not seen cardiologist in a year    Problem List:  Patient Active Problem List   Diagnosis   . HTN (hypertension) urgency   . Hyperlipidemia   . History of CVA (cerebrovascular accident)   . Elevated brain natriuretic peptide (BNP) level   . Hyponatremia   . NSTEMI (non-ST elevated myocardial infarction)   . End stage renal disease   . Acute bronchitis   . Anemia in chronic kidney disease (CODE)   . Iron deficiency anemia, unspecified iron deficiency anemia type   . Chronic renal failure, stage 5   . Diabetes mellitus   . Bronchiectasis       Current Medications:  Current Outpatient Prescriptions   Medication Sig Dispense Refill   . amLODIPine (NORVASC) 10 MG tablet Take 1 tablet (10 mg total) by mouth daily. (Patient taking differently: Take 10 mg by mouth every morning.    ) 90 tablet 1   . aspirin EC 325 MG EC tablet Take 1 tablet (325 mg total) by mouth daily. 30 tablet 0   . bumetanide (BUMEX) 0.5 MG tablet TAKE ONE TABLET BY MOUTH ONCE DAILY (Patient taking  differently: TAKE ONE TABLET BY MOUTH ONCE DAILY-AM) 90 tablet 1   . carvedilol (COREG) 25 MG tablet 12.5 mg 2 (two) times daily with meals.         . Cholecalciferol (VITAMIN D3) 2000 units Tab Take 4,000 IU by mouth daily.     . cloNIDine (CATAPRES) 0.1 MG tablet Take 0.1 mg by mouth daily.         . ferrous sulfate 324 (65 FE) MG Tablet Delayed Response TAKE 1 TABLET BY MOUTH IN THE MORNING WITH BREAKFAST 90 tablet 1   . fluticasone-salmeterol (ADVAIR DISKUS) 100-50 MCG/DOSE Aerosol Powder, Breath Activtivatede Inhale 1 puff into the lungs 2 (two) times daily. 1 each 0   . montelukast (SINGULAIR) 10 MG tablet as needed.     . ONE TOUCH ULTRA TEST test strip Check blood sugar 2 times daily 200 each 3   . pantoprazole (PROTONIX) 40 MG tablet TAKE ONE TABLET BY MOUTH IN THE MORNING BEFORE BREAKFAST 90 tablet 1   . RENVELA 800 MG tablet 1,600 mg 3 (three) times daily with meals.         . simvastatin (ZOCOR) 20 MG tablet Take 20 mg by mouth nightly.         . traMADol (ULTRAM) 50 MG tablet Take 50 mg by mouth every 6 (six) hours as needed  for Pain.     . Linagliptin (TRADJENTA) 5 MG Tab Take 1 tablet (5 mg total) by mouth daily. 90 tablet 1     No current facility-administered medications for this visit.        Allergies:  Allergies   Allergen Reactions   . Mosquito (Culex Pipiens) Allergy Skin Test Shortness Of Breath     And  Trouble  breathing   . Shellfish Allergy Hives   . Shellfish-Derived Products Hives     New  allergy  New  allergy   . Lisinopril Hives and Rash     Patient cannot remember the exact nature of the allergy   . Penicillins Rash and Swelling     numbness  Swelling,  numbness       Past Medical History:  Past Medical History:   Diagnosis Date   . Anemia 10/02/2016    H/O ANEMIA - REFLECTED ON CURRENT LABS.   [name] Cataracts, bilateral     H/O CATARACTS - SURGERY DONE - STILL HAS SOME ISSUES.   . Cerebrovascular accident 2014    H/O STROKE - WEAKNESS ON LEFT SIDE. NO NEUROLOGIST. USES CANE FOR BALANCE.    [name] Chronic renal failure, stage 5 05/08/2017   . Constipation    . Coronary artery disease 06/2016    H/O QUADRUPLE BYPASS SURGERY - FOLLOWED BY [facility].   . Diabetes mellitus    . Difficulty walking     WALKS WITH CANE. WILL USE WC IN HOSPITAL   . End-stage renal disease 04/2017    DIALYSIS [facility] [place] MON-WED[facility]. HAS PERMACATH RIGHT CHEST   . Gastroesophageal reflux disease     MED EFFECTIVE    . History of MI (myocardial infarction)    . 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 Hypercholesteremia     CONTROLLED WITH MEDS.   [name] Hypertension     CONTROLLED WITH MEDS.   [name] Myocardial infarction 06/2016   . Pre-diabetes    . Renal insufficiency     CHRONIC KIDNEY DISEASE - FOLLOWED BY DR. Kate Sable.   [name] Shortness of breath     MODERATE EXERTION    . Type 2 diabetes mellitus, controlled     AM FSBS 150-200. INSTRUCTED TO CONTROL DIET CLOSELY TONIGHT.  HAD BEEN OFF JANUVIA, RESTARTED 06/15/17. HA1C 9 06/15/17. MANAGED BY PCP.        Past Surgical History:  Past Surgical History:   Procedure Laterality Date   . BRONCHOSCOPY, FIBEROPTIC N/A 06/21/2017    Procedure: BRONCHOSCOPY with brushing and wash;  Surgeon: Danae Chen, MD;  Location: [facility];  Service: Pulmonary;  Laterality: N/A;   . CORONARY ARTERY BYPASS N/A 06/12/2016    Procedure: Coronary Artery Bypass x 4.;  Surgeon: Bjorn Loser, MD;  Location: [facility] OR;  Service: Cardiothoracic;  Laterality: N/A;  LIMA to LDA  Vein to PDA  Vein to OM  Vein to Diagonal   . EGD, COLONOSCOPY N/A 10/04/2016   . EGD, COLONOSCOPY N/A 10/04/2016    Procedure: EGD w/ bx, COLONOSCOPY w/ bx, polypectomy;  Surgeon: Norton Blizzard, MD;  Location: [facility];  Service: Gastroenterology;  Laterality: N/A;   . ENDOSCOPIC,VEIN HARVEST N/A 06/12/2016    Procedure: Endoscopic, Left Leg Greater Saphenous Vein Harvest;  Surgeon: Bjorn Loser, MD;  Location: [facility] OR;  Service: Cardiothoracic;  Laterality: N/A;  Left Leg Incision (medially, groin to ankle)  @ [phone]  Vein out of leg @ 0826  Vein prep complete @ 0845  Total time = 63 minutes   . FORMATION, UPPER EXTREMITY, A-V FISTULA Left 05/31/2017    Procedure: FORMATION, UPPER EXTREMITY, A-V FISTULA;  Surgeon: Cherene Altes, MD;  Location: [place] [facility] OR;  Service: Vascular;  Laterality: Left;  LEFT ARM A-V FISTULA     . REVISION, A-V FISTULA UPPER EXTREMITY Left 07/16/2017    Procedure: REVISION, A-V FISTULA UPPER EXTREMITY WITH PATCH ANGIOPLASTY;  Surgeon: Cherene Altes, MD;  Location: [place] [facility] OR;  Service: Vascular;  Laterality: Left;       Family History:  Family History   Problem Relation Age of Onset   . No known problems Mother    . No known problems Father        Social History:  Social History     Social History   . Marital status: Widowed     Spouse name: N/A   . Number of children: N/A   . Years of education: N/A     Occupational History   . Not on file.     Social History Main Topics   . Smoking status: Never Smoker   . Smokeless tobacco: Never Used   . Alcohol use No   . Drug use: No   . Sexual activity: No     Other Topics Concern   . Not on file     Social History Narrative   . No narrative on file       The following sections were reviewed this encounter by the provider:   Tobacco  Allergies  Meds  Problems  Med Hx  Surg Hx  Fam Hx  Soc Hx          Vitals:  BP 114/69 (BP Site: Left arm, Patient Position: Sitting, Cuff Size: Medium)   Pulse (!) 102   Temp 98.6 F (37 C) (Oral)   Resp (!) 89   Wt 64 kg (141 lb)   BMI 29.47 kg/m          ROS:  Review of Systems   Constitutional: Negative for fatigue.   Respiratory: Negative for cough, shortness of breath and wheezing.    Cardiovascular: Negative for chest pain, palpitations and leg swelling.   Gastrointestinal: Negative for abdominal pain, constipation, diarrhea, nausea and vomiting.   Endocrine: Negative for polydipsia and polyuria.   Neurological: Negative for dizziness.         Objective:       Physical Exam:  General  Examination:   GENERAL APPEARANCE: alert, in no acute distress, well developed, well nourished, oriented to time, place, and person.   HEAD: normal appearance, atraumatic.   EYES: extraocular movement intact (EOMI), pupils equal, round, reactive to light and accommodation, sclera anicteric, conjunctiva clear.   ORAL CAVITY: normal oropharynx, normal lips, mucosa moist, no lesions.   THROAT: normal appearance, clear, no erythema.   NECK/THYROID: neck supple,  no cervical lymphadenopathy, no neck mass palpated, no thyromegaly.   LYMPH NODES: no palpable adenopathy.   SKIN: good turgor, no rashes, no suspicious lesions.   HEART: S1, S2 normal, no murmurs, rubs, gallops, regular rate and rhythm.   LUNGS: normal effort / no distress, normal breath sounds, clear to auscultation bilaterally, no wheezes, rales, rhonchi.   MUSCULOSKELETAL: full range of motion, no swelling or deformity.   EXTREMITIES: no edema,  NEUROLOGIC: nonfocal, cranial nerves 2-12 grossly intact,  normal strength and tone, sensory exam intact.  PSYCH: cognitive function intact, mood/affect full range, speech clear.      Assessment/Plan:       1. Essential hypertension  BP going to low  D/c clonidine, advised patient's daughter to contact nephrologist    2. Type 2 diabetes mellitus with complication, without long-term current use of insulin  FBS still too high, f/u with endocrine    3. Need for vaccination  - Pneumococcal conjugate vaccine 13-valent less than 5yo IM    4. History of non-ST elevation myocardial infarction (NSTEMI)  Advised patient's daughter to make an appointment with cardiologist, needs regular cardiac f/u s/p NSTEMI    F/u 6 months.        John Giovanni, MD

## 2017-10-16 NOTE — Progress Notes (Signed)
Have you seen any specialists/other providers since your last visit with [place]?    Yes. Endocrinologist.    Arm preference verified?   Yes    The patient is due for foot exam, shingles vaccine, pneumonia vaccine and [facility] Care Plan letter, DXA Scan

## 2017-11-07 LAB — VAHRT HISTORIC LVEF: Ejection Fraction: 75 %

## 2017-11-08 ENCOUNTER — Ambulatory Visit (INDEPENDENT_AMBULATORY_CARE_PROVIDER_SITE_OTHER): Payer: Self-pay | Admitting: Cardiovascular Disease

## 2017-11-10 ENCOUNTER — Encounter (INDEPENDENT_AMBULATORY_CARE_PROVIDER_SITE_OTHER): Payer: Self-pay

## 2017-11-13 ENCOUNTER — Ambulatory Visit (HOSPITAL_BASED_OUTPATIENT_CLINIC_OR_DEPARTMENT_OTHER): Payer: Medicare Other | Admitting: Specialist

## 2017-11-13 ENCOUNTER — Other Ambulatory Visit (HOSPITAL_BASED_OUTPATIENT_CLINIC_OR_DEPARTMENT_OTHER): Payer: Medicare Other

## 2017-11-15 ENCOUNTER — Ambulatory Visit (FREE_STANDING_LABORATORY_FACILITY): Payer: Medicare Other | Admitting: "Endocrinology

## 2017-11-15 ENCOUNTER — Encounter (INDEPENDENT_AMBULATORY_CARE_PROVIDER_SITE_OTHER): Payer: Self-pay | Admitting: "Endocrinology

## 2017-11-15 VITALS — BP 118/69 | HR 102 | Resp 16 | Ht <= 58 in | Wt 135.0 lb

## 2017-11-15 DIAGNOSIS — Z992 Dependence on renal dialysis: Secondary | ICD-10-CM

## 2017-11-15 DIAGNOSIS — N186 End stage renal disease: Secondary | ICD-10-CM

## 2017-11-15 DIAGNOSIS — Z8673 Personal history of transient ischemic attack (TIA), and cerebral infarction without residual deficits: Secondary | ICD-10-CM

## 2017-11-15 DIAGNOSIS — I120 Hypertensive chronic kidney disease with stage 5 chronic kidney disease or end stage renal disease: Secondary | ICD-10-CM

## 2017-11-15 DIAGNOSIS — E875 Hyperkalemia: Secondary | ICD-10-CM

## 2017-11-15 DIAGNOSIS — E1122 Type 2 diabetes mellitus with diabetic chronic kidney disease: Secondary | ICD-10-CM

## 2017-11-15 LAB — CBC AND DIFFERENTIAL
Absolute NRBC: 0 10*3/uL (ref 0.00–0.00)
Basophils Absolute Automated: 0.03 10*3/uL (ref 0.00–0.08)
Basophils Automated: 0.4 %
Eosinophils Absolute Automated: 0.18 10*3/uL (ref 0.00–0.44)
Eosinophils Automated: 2.5 %
Hematocrit: 36.8 % (ref 34.7–43.7)
Hgb: 10.6 g/dL — ABNORMAL LOW (ref 11.4–14.8)
Immature Granulocytes Absolute: 0.02 10*3/uL (ref 0.00–0.07)
Immature Granulocytes: 0.3 %
Lymphocytes Absolute Automated: 2.08 10*3/uL (ref 0.42–3.22)
Lymphocytes Automated: 28.6 %
MCH: 22.9 pg — ABNORMAL LOW (ref 25.1–33.5)
MCHC: 28.8 g/dL — ABNORMAL LOW (ref 31.5–35.8)
MCV: 79.5 fL (ref 78.0–96.0)
MPV: 12.6 fL — ABNORMAL HIGH (ref 8.9–12.5)
Monocytes Absolute Automated: 0.62 10*3/uL (ref 0.21–0.85)
Monocytes: 8.5 %
Neutrophils Absolute: 4.35 10*3/uL (ref 1.10–6.33)
Neutrophils: 59.7 %
Nucleated RBC: 0 /100 WBC (ref 0.0–0.0)
Platelets: 152 10*3/uL (ref 142–346)
RBC: 4.63 10*6/uL (ref 3.90–5.10)
RDW: 15 % (ref 11–15)
WBC: 7.28 10*3/uL (ref 3.10–9.50)

## 2017-11-15 LAB — COMPREHENSIVE METABOLIC PANEL
ALT: 12 U/L (ref 0–55)
AST (SGOT): 15 U/L (ref 5–34)
Albumin/Globulin Ratio: 1 (ref 0.9–2.2)
Albumin: 3.7 g/dL (ref 3.5–5.0)
Alkaline Phosphatase: 146 U/L — ABNORMAL HIGH (ref 37–106)
BUN: 46 mg/dL — ABNORMAL HIGH (ref 7.0–19.0)
Bilirubin, Total: 0.3 mg/dL (ref 0.2–1.2)
CO2: 31 mEq/L — ABNORMAL HIGH (ref 21–29)
Calcium: 9.7 mg/dL (ref 8.5–10.5)
Chloride: 95 mEq/L — ABNORMAL LOW (ref 100–111)
Creatinine: 5.1 mg/dL — ABNORMAL HIGH (ref 0.4–1.5)
Globulin: 3.8 g/dL — ABNORMAL HIGH (ref 2.0–3.7)
Glucose: 124 mg/dL — ABNORMAL HIGH (ref 70–100)
Potassium: 5.1 mEq/L (ref 3.5–5.1)
Protein, Total: 7.5 g/dL (ref 6.0–8.3)
Sodium: 139 mEq/L (ref 136–145)

## 2017-11-15 LAB — LIPID PANEL
Cholesterol / HDL Ratio: 2.1
Cholesterol: 153 mg/dL (ref 0–199)
HDL: 72 mg/dL (ref 40–9999)
LDL Calculated: 57 mg/dL (ref 0–99)
Triglycerides: 120 mg/dL (ref 34–149)
VLDL Calculated: 24 mg/dL (ref 10–40)

## 2017-11-15 LAB — GFR: EGFR: 8.5

## 2017-11-15 LAB — TSH: TSH: 1.42 u[IU]/mL (ref 0.35–4.94)

## 2017-11-15 LAB — HEMOGLOBIN A1C
Average Estimated Glucose: 134.1 mg/dL
Hemoglobin A1C: 6.3 % — ABNORMAL HIGH (ref 4.6–5.9)

## 2017-11-15 LAB — HEMOLYSIS INDEX: Hemolysis Index: 1 (ref 0–18)

## 2017-11-15 NOTE — Progress Notes (Signed)
Subjective:      Date: 11/15/2017 10:01 AM   Patient ID: Jacqueline Weber is a 67 y.o. female.    Chief Complaint:  Chief Complaint   Patient presents with   . Diabetes     type 2 non ins       HPI    Visit type: follow up  Type: Type II and non-insulin requiring  Evaluation: diagnosed years ago  Last follow-up: a month ago     Diabetes Medication Regimen: Tradjenta 5 mg daily   Medication effectiveness/adherence: overall improved BS  and general adherence with medications  Medication side effects: none  Presenting symptoms at time of diagnosis: random glucose >200 mg/dL  Prior medications: Metformin d/c'd due to ESRD   Current symptoms: none  Patient denies: no other symptoms including chest pain, shortness of breath, hypoglycemic episodes, polydipsia, polyuria, headaches, dizziness, or abdominal pain  Exercise: walking  Diet: inconsistent compliance with and recommended diet  Complications: ESRD on hemodialysis - started hemodialysis October 2018   Pertinent medical history: CAD, hyperlipidemia and stroke, ESRD on HD   Home glucose readings: Download fasting 126 - 166;  2 hours after meals mid 100s to high 100s; had 1 BS in the low 300s when she ate a lot of cantaloupe   A1c result: 8.4 (07/25/17)   A1c trend is variable  Microalbumin trend: No results found for: MICROALBUMIN - n/a due to ESRD on HD   Microalbumin trend is UTD   LDL trend:   Lab Results   Component Value Date    LDL 83 10/16/2016    LDL 122 (H) 08/07/2016    LDL 108 (H) 05/16/2016     LDL trend is decreasing  Additional concerns: In addition, pt has hyperlipidemia and is compliant with lipid therapy.  Pt denies side effects of lipid therapy - specifically denies myalgia. Has HTN, no evidence of CHF and is compliant with medications; hx of stroke 4-5 years ago with no residual neuro deficits; Had quadruple bypass 1 year ago, currently no anginal symptoms; developed ESRD and started hemodialysis October 2018, dialysis days MWF.     Last eye exam was  May 29, 2017 - no known retinopathy.     Hx of stroke 4 to 5 years ago, no neuro deficit.     Problem List:  Patient Active Problem List   Diagnosis   . HTN (hypertension) urgency   . Hyperlipidemia   . History of CVA (cerebrovascular accident)   . Elevated brain natriuretic peptide (BNP) level   . Hyponatremia   . NSTEMI (non-ST elevated myocardial infarction)   . End stage renal disease   . Acute bronchitis   . Anemia in chronic kidney disease (CODE)   . Iron deficiency anemia, unspecified iron deficiency anemia type   . Chronic renal failure, stage 5   . Diabetes mellitus   . Bronchiectasis       Current Medications:  Current Outpatient Prescriptions   Medication Sig Dispense Refill   . aspirin EC 325 MG EC tablet Take 1 tablet (325 mg total) by mouth daily. 30 tablet 0   . carvedilol (COREG) 3.125 MG tablet Take 3.125 mg by mouth daily.         . Cholecalciferol (VITAMIN D3) 2000 units Tab Take 4,000 IU by mouth daily.     . ferrous sulfate 324 (65 FE) MG Tablet Delayed Response TAKE 1 TABLET BY MOUTH IN THE MORNING WITH BREAKFAST 90 tablet 1   . fluticasone-salmeterol (ADVAIR  DISKUS) 100-50 MCG/DOSE Aerosol Powder, Breath Activtivatede Inhale 1 puff into the lungs 2 (two) times daily. 1 each 0   . Linagliptin (TRADJENTA) 5 MG Tab Take 1 tablet (5 mg total) by mouth daily. 90 tablet 1   . montelukast (SINGULAIR) 10 MG tablet as needed.     . ONE TOUCH ULTRA TEST test strip Check blood sugar 2 times daily 200 each 3   . pantoprazole (PROTONIX) 40 MG tablet TAKE ONE TABLET BY MOUTH IN THE MORNING BEFORE BREAKFAST 90 tablet 1   . RENVELA 800 MG tablet 1,600 mg 3 (three) times daily with meals.         . simvastatin (ZOCOR) 20 MG tablet Take 20 mg by mouth nightly.         . traMADol (ULTRAM) 50 MG tablet Take 50 mg by mouth every 6 (six) hours as needed for Pain.     [name] Umeclidinium Bromide (INCRUSE ELLIPTA IN) Inhale into the lungs.       No current facility-administered medications for this visit.         Allergies:  Allergies   Allergen Reactions   . Mosquito (Culex Pipiens) Allergy Skin Test Shortness Of Breath     And  Trouble  breathing   . Shellfish Allergy Hives   . Shellfish-Derived Products Hives     New  allergy  New  allergy   . Lisinopril Hives and Rash     Patient cannot remember the exact nature of the allergy   . Penicillins Rash and Swelling     numbness  Swelling,  numbness       Past Medical History:  Past Medical History:   Diagnosis Date   . Anemia 10/02/2016    H/O ANEMIA - REFLECTED ON CURRENT LABS.   [name] Cataracts, bilateral     H/O CATARACTS - SURGERY DONE - STILL HAS SOME ISSUES.   . Cerebrovascular accident 2014    H/O STROKE - WEAKNESS ON LEFT SIDE. NO NEUROLOGIST. USES CANE FOR BALANCE.   [name] Chronic renal failure, stage 5 05/08/2017   . Constipation    . Coronary artery disease 06/2016    H/O QUADRUPLE BYPASS SURGERY - FOLLOWED BY [facility].   . Diabetes mellitus    . Difficulty walking     WALKS WITH CANE. WILL USE WC IN HOSPITAL   . End-stage renal disease 04/2017    DIALYSIS [facility] [place] MON-WED-FRI. HAS PERMACATH RIGHT CHEST   . Gastroesophageal reflux disease     MED EFFECTIVE    . History of MI (myocardial infarction)    . 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 Hypercholesteremia     CONTROLLED WITH MEDS.   [name] Hypertension     CONTROLLED WITH MEDS.   [name] Myocardial infarction 06/2016   . Pre-diabetes    . Renal insufficiency     CHRONIC KIDNEY DISEASE - FOLLOWED BY DR. Kate Sable.   [name] Shortness of breath     MODERATE EXERTION    . Type 2 diabetes mellitus, controlled     AM FSBS 150-200. INSTRUCTED TO CONTROL DIET CLOSELY TONIGHT.  HAD BEEN OFF JANUVIA, RESTARTED 06/15/17. HA1C 9 06/15/17. MANAGED BY PCP.        Past Surgical History:  Past Surgical History:   Procedure Laterality Date   . BRONCHOSCOPY, FIBEROPTIC N/A 06/21/2017    Procedure: BRONCHOSCOPY with brushing and wash;  Surgeon: Danae Chen, MD;  Location: [facility];  Service: Pulmonary;  Laterality: N/A;   .  CORONARY ARTERY BYPASS N/A 06/12/2016    Procedure: Coronary Artery Bypass x 4.;  Surgeon: Bjorn Loser, MD;  [facility] OR;  Service: Cardiothoracic;  Laterality: N/A;  LIMA to LDA  Vein to PDA  Vein to OM  Vein to Diagonal   . EGD, COLONOSCOPY N/A 10/04/2016   . EGD, COLONOSCOPY N/A 10/04/2016    Procedure: EGD w/ bx, COLONOSCOPY w/ bx, polypectomy;  Surgeon: Norton Blizzard, MD;  Location: [place];  Service: Gastroenterology;  Laterality: N/A;   . ENDOSCOPIC,VEIN HARVEST N/A 06/12/2016    Procedure: Endoscopic, Left Leg Greater Saphenous Vein Harvest;  Surgeon: Bjorn Loser, MD;  Location: [facility] OR;  Service: Cardiothoracic;  Laterality: N/A;  Left Leg Incision (medially, groin to ankle) @ 0742  Vein out of leg @ 0826  Vein prep complete @ 0845  Total time = 63 minutes   . FORMATION, UPPER EXTREMITY, A-V FISTULA Left 05/31/2017    Procedure: FORMATION, UPPER EXTREMITY, A-V FISTULA;  Surgeon: Cherene Altes, MD;  Location: [place] [facility] OR;  Service: Vascular;  Laterality: Left;  LEFT ARM A-V FISTULA     . REVISION, A-V FISTULA UPPER EXTREMITY Left 07/16/2017    Procedure: REVISION, A-V FISTULA UPPER EXTREMITY WITH PATCH ANGIOPLASTY;  Surgeon: Cherene Altes, MD;  Location: [place] [facility] OR;  Service: Vascular;  Laterality: Left;       Family History:  Family History   Problem Relation Age of Onset   . No known problems Mother    . No known problems Father        Social History:  Social History     Social History   . Marital status: Widowed     Spouse name: N/A   . Number of children: N/A   . Years of education: N/A     Occupational History   . Not on file.     Social History Main Topics   . Smoking status: Never Smoker   . Smokeless tobacco: Never Used   . Alcohol use No   . Drug use: No   . Sexual activity: No     Other Topics Concern   . Not on file     Social History Narrative   . No narrative on file       The following sections were reviewed this encounter by the  provider:   Tobacco  Allergies  Meds  Problems  Med Hx  Surg Hx  Fam Hx  Soc Hx          Vitals:  BP 118/69 (BP Site: Left arm, Patient Position: Sitting, Cuff Size: Medium)   Pulse (!) 102   Resp 16   Ht 1.473 m (4\' 10" )   Wt 61.2 kg (135 lb)   BMI 28.22 kg/m      ROS: A complete 12 point ROS was obtained. Pertinent positives and negatives as noted in HPI. All other systems are negative.    Physical Examination     General appearance: alert, appears stated age, cooperative and no distress  Eyes: conjunctivae/corneas clear. PERRL, EOM's intact.   Neck: no cervical lymphadenopathy, no carotid bruit,  supple, symmetrical, trachea midline   Thyroid:normal sized on inspection; non tender to palpation and no discrete nodules palpated  Lungs: clear to auscultation bilaterally  Heart: regular rate and rhythm, S1, S2 normal, no murmur, click, rub or gallop  Abdomen: soft, non-tender; bowel sounds  normal; no masses,  no organomegaly  Extremities: extremities normal, atraumatic, no cyanosis or edema  Skin: Skin color, texture, turgor normal. No rashes or lesions  Neurologic: Alert and oriented X 3, normal strength and tone. Normal symmetric reflexes. No tremor    Assessment and Plan     1. Type 2 diabetes mellitus with chronic kidney disease on chronic dialysis, without long-term current use of insulin  -   Lab Results   Component Value Date    HGBA1C 8.4 (H) 07/24/2017    HGBA1C 9.0 (A) 06/15/2017    HGBA1C 6.2 (H) 02/13/2017       Current A1C is elevated above goal and trend has been improving compared to prior values. BS trends have improved.  Continue current DM medication regimen. Recommend home glucometer testing twice a day (pre-breakfast or 2 hrs post-prandial).  Recommend therapeutic lifestyle changes which include optimizing low carbohydrate diet and aerobic exercise efforts.     - Lipid panel  - Hemoglobin A1c  - Comprehensive metabolic panel  - TSH  - CBC and differential    2. Hyperlipidemia  -  LDL  at goal < 100    - on statin and well tolerated    3. Hypertension  - well controlled     - on Amlodipine 10 mg daily, Carvedilol 25 mg BID, Clonidine 0.1 mg daily, Hydralazine 100 mg TID    4. Stroke   - no residual neuro deficits  - on Aspirin and statin for secondary stroke prevention    5. ESRD on HD  -   - tolerating HD without complications  - dialysis days MWF     RTC in 3 weeks     Ernesto Rutherford MSN FNP-BC  [facility]

## 2017-11-27 ENCOUNTER — Encounter (INDEPENDENT_AMBULATORY_CARE_PROVIDER_SITE_OTHER): Payer: Self-pay | Admitting: Internal Medicine

## 2017-11-27 ENCOUNTER — Ambulatory Visit (INDEPENDENT_AMBULATORY_CARE_PROVIDER_SITE_OTHER): Payer: Medicare Other | Admitting: Internal Medicine

## 2017-11-27 VITALS — BP 100/64 | HR 83 | Temp 97.6°F | Wt 136.4 lb

## 2017-11-27 DIAGNOSIS — R0982 Postnasal drip: Secondary | ICD-10-CM

## 2017-11-27 DIAGNOSIS — R6889 Other general symptoms and signs: Secondary | ICD-10-CM

## 2017-11-27 DIAGNOSIS — J302 Other seasonal allergic rhinitis: Secondary | ICD-10-CM

## 2017-11-27 DIAGNOSIS — L608 Other nail disorders: Secondary | ICD-10-CM

## 2017-11-27 MED ORDER — OLOPATADINE HCL 0.2 % OP SOLN
1.0000 [drp] | Freq: Every day | OPHTHALMIC | 3 refills | Status: DC
Start: 2017-11-27 — End: 2018-06-06

## 2017-11-27 MED ORDER — FLUTICASONE PROPIONATE 50 MCG/ACT NA SUSP
2.0000 | Freq: Every day | NASAL | 0 refills | Status: DC
Start: 2017-11-27 — End: 2018-04-09

## 2017-11-27 NOTE — Progress Notes (Signed)
Subjective:      Date: 11/27/2017 4:40 PM   Patient ID: Jacqueline Weber is a 67 y.o. female.    Chief Complaint:  Chief Complaint   Patient presents with   . URI       HPI:  HPI     C/o  congestion, coughing,  Sneezing +  Itchy watery eyes +  Seasonal allergies +  PND +  . There has been no fever.     Pertinent negatives include no chills, diaphoresis, ear pain, hoarse voice, neck pain, shortness of breath or sneezing. Past treatments include acetaminophen and saline sprays. The treatment provided mild relief.     - c/o thick discolored nail   White thick nail ned +  Pt accompanied by daughter who helped in translation     Problem List:  Patient Active Problem List   Diagnosis   . HTN (hypertension) urgency   . Hyperlipidemia   . History of CVA (cerebrovascular accident)   . Elevated brain natriuretic peptide (BNP) level   . Hyponatremia   . NSTEMI (non-ST elevated myocardial infarction)   . End stage renal disease   . Acute bronchitis   . Anemia in chronic kidney disease (CODE)   . Iron deficiency anemia, unspecified iron deficiency anemia type   . Chronic renal failure, stage 5   . Diabetes mellitus   . Bronchiectasis       Current Medications:  Outpatient Prescriptions Marked as Taking for the 11/27/17 encounter (Office Visit) with Marrian Salvage, MD   Medication Sig Dispense Refill   . aspirin EC 325 MG EC tablet Take 1 tablet (325 mg total) by mouth daily. 30 tablet 0   . carvedilol (COREG) 3.125 MG tablet Take 3.125 mg by mouth daily.         . Cholecalciferol (VITAMIN D3) 2000 units Tab Take 4,000 IU by mouth daily.     . ferrous sulfate 324 (65 FE) MG Tablet Delayed Response TAKE 1 TABLET BY MOUTH IN THE MORNING WITH BREAKFAST 90 tablet 1   . fluticasone-salmeterol (ADVAIR DISKUS) 100-50 MCG/DOSE Aerosol Powder, Breath Activtivatede Inhale 1 puff into the lungs 2 (two) times daily. 1 each 0   . Linagliptin (TRADJENTA) 5 MG Tab Take 1 tablet (5 mg total) by mouth daily. 90 tablet 1   . montelukast (SINGULAIR) 10  MG tablet as needed.     . ONE TOUCH ULTRA TEST test strip Check blood sugar 2 times daily 200 each 3   . pantoprazole (PROTONIX) 40 MG tablet TAKE ONE TABLET BY MOUTH IN THE MORNING BEFORE BREAKFAST 90 tablet 1   . RENVELA 800 MG tablet 1,600 mg 3 (three) times daily with meals.         . simvastatin (ZOCOR) 20 MG tablet Take 20 mg by mouth nightly.         [name] Umeclidinium Bromide (INCRUSE ELLIPTA IN) Inhale into the lungs.         Allergies:  Allergies   Allergen Reactions   . Mosquito (Culex Pipiens) Allergy Skin Test Shortness Of Breath     And  Trouble  breathing   . Shellfish Allergy Hives   . Shellfish-Derived Products Hives     New  allergy  New  allergy   . Lisinopril Hives and Rash     Patient cannot remember the exact nature of the allergy   . Penicillins Rash and Swelling     numbness  Swelling,  numbness  Past Medical History:  Past Medical History:   Diagnosis Date   . Anemia 10/02/2016    H/O ANEMIA - REFLECTED ON CURRENT LABS.   [name] Cataracts, bilateral     H/O CATARACTS - SURGERY DONE - STILL HAS SOME ISSUES.   . Cerebrovascular accident 2014    H/O STROKE - WEAKNESS ON LEFT SIDE. NO NEUROLOGIST. USES CANE FOR BALANCE.   [name] Chronic renal failure, stage 5 05/08/2017   . Constipation    . Coronary artery disease 06/2016    H/O QUADRUPLE BYPASS SURGERY - FOLLOWED BY [facility].   . Diabetes mellitus    . Difficulty walking     WALKS WITH CANE. WILL USE WC IN HOSPITAL   . End-stage renal disease 04/2017    DIALYSIS [facility]. HAS PERMACATH RIGHT CHEST   . Gastroesophageal reflux disease     MED EFFECTIVE    . History of MI (myocardial infarction)    . 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 Hypercholesteremia     CONTROLLED WITH MEDS.   [name] Hypertension     CONTROLLED WITH MEDS.   [name] Myocardial infarction 06/2016   . Pre-diabetes    . Renal insufficiency     CHRONIC KIDNEY DISEASE - FOLLOWED BY DR. Kate Sable.   [name] Shortness of breath     MODERATE EXERTION    . Type 2 diabetes mellitus, controlled      AM FSBS 150-200. INSTRUCTED TO CONTROL DIET CLOSELY TONIGHT.  HAD BEEN OFF JANUVIA, RESTARTED 06/15/17. HA1C 9 06/15/17. MANAGED BY PCP.        Past Surgical History:  Past Surgical History:   Procedure Laterality Date   . BRONCHOSCOPY, FIBEROPTIC N/A 06/21/2017    Procedure: BRONCHOSCOPY with brushing and wash;  Surgeon: Danae Chen, MD;  Location: [facility] OR;  Service: Pulmonary;  Laterality: N/A;   . CORONARY ARTERY BYPASS N/A 06/12/2016    Procedure: Coronary Artery Bypass x 4.;  Surgeon: Bjorn Loser, MD;  Location: [facility];  Service: Cardiothoracic;  Laterality: N/A;  LIMA to LDA  Vein to PDA  Vein to OM  Vein to Diagonal   . EGD, COLONOSCOPY N/A 10/04/2016   . EGD, COLONOSCOPY N/A 10/04/2016    Procedure: EGD w/ bx, COLONOSCOPY w/ bx, polypectomy;  Surgeon: Norton Blizzard, MD;  Location: [place] [facility] OR;  Service: Gastroenterology;  Laterality: N/A;   . ENDOSCOPIC,VEIN HARVEST N/A 06/12/2016    Procedure: Endoscopic, Left Leg Greater Saphenous Vein Harvest;  Surgeon: Bjorn Loser, MD;  Location: [facility] HE[facility];  Service: Cardiothoracic;  Laterality: N/A;  Left Leg Incision (medially, groin to ankle) @ 0742  Vein out of leg @ 0826  Vein prep complete @ 0845  Total time = 63 minutes   . FORMATION, UPPER EXTREMITY, A-V FISTULA Left 05/31/2017    Procedure: FORMATION, UPPER EXTREMITY, A-V FISTULA;  Surgeon: Cherene Altes, MD;  Location: [facility] OR;  Service: Vascular;  Laterality: Left;  LEFT ARM A-V FISTULA     . REVISION, A-V FISTULA UPPER EXTREMITY Left 07/16/2017    Procedure: REVISION, A-V FISTULA UPPER EXTREMITY WITH PATCH ANGIOPLASTY;  Surgeon: Cherene Altes, MD;  Location: [place] [facility] OR;  Service: Vascular;  Laterality: Left;       Family History:  Family History   Problem Relation Age of Onset   . No known problems Mother    . No known problems Father  Social History:  Social History     Social History   . Marital status: Widowed     Spouse  name: N/A   . Number of children: N/A   . Years of education: N/A     Occupational History   . Not on file.     Social History Main Topics   . Smoking status: Never Smoker   . Smokeless tobacco: Never Used   . Alcohol use No   . Drug use: No   . Sexual activity: No     Other Topics Concern   . Not on file     Social History Narrative   . No narrative on file       The following sections were reviewed this encounter by the provider:   Tobacco  Allergies  Meds  Problems  Med Hx  Surg Hx  Fam Hx  Soc Hx          Vitals:  BP 100/64 (BP Site: Right arm, Patient Position: Sitting, Cuff Size: Medium)   Pulse 83   Temp 97.6 F (36.4 C) (Oral)   Wt 61.9 kg (136 lb 6.4 oz)   SpO2 97%   BMI 28.51 kg/m     ROS:   General/Constitutional:   Denies Chills. Admits Fatigue. Denies Fever.   Ophthalmologic:   Denies Blurred vision. Denies Eye Discharge. Denies Eye Redness.   ENT:   Denies Ear pain. Denies Nosebleed. Denies Sneezing.   Respiratory:   Denies Cough. Denies Shortness of breath. Denies Wheezing.   Cardiovascular:   Denies Chest pain. Denies Palpitations.   Gastrointestinal:   Denies Abdominal pain. Denies Diarrhea. Denies Nausea. Denies Vomiting.   Skin:   Denies Rash.   Neurologic:   Denies Dizziness. Denies Headache.           Objective:     Examination:   General Examination:   GENERAL APPEARANCE: alert, well developed, well nourished.   HEAD: no maxillary sinus tenderness, no scalp lesions.   EYES: conjunctiva clear, no discharge.   EARS: tympanic membrane intact, clear B/L, EAC's clear B/L.   NOSE: edematous nasal mucosa.   ORAL CAVITY: good dentition, no lesions, mucosa moist.   THROAT: no erythema, no exudate, purulent post-nasal drip.   NECK/THYROID: neck supple.   LYMPH NODES: no cervical adenopathy.   SKIN: no rashes.   HEART: S1, S2 normal, regular rate and rhythm, no murmurs, rubs, gallops.   LUNGS: clear to auscultation bilaterally, no wheezes, rales, rhonchi.   NEUROLOGIC: alert and oriented.             Assessment/Plan:       1. Post-nasal drip  - fluticasone (FLONASE) 50 MCG/ACT nasal spray; 2 sprays by Nasal route daily.2 sprays each nostril daily  Dispense: 1 Bottle; Refill: 0    2. Seasonal allergies  - fluticasone (FLONASE) 50 MCG/ACT nasal spray; 2 sprays by Nasal route daily.2 sprays each nostril daily  Dispense: 1 Bottle; Refill: 0    3. Itchy eyes  - Olopatadine HCl (PATADAY) 0.2 % Solution; Apply 1 drop to eye daily.  Dispense: 2.5 mL; Refill: 3    4. Nail discoloration  - Ambulatory referral to Dermatology                Marrian Salvage, MD

## 2017-11-27 NOTE — Progress Notes (Signed)
Have you seen any specialists/other providers since your last visit with [place]?    Yes. Endocrinologists.    Arm preference verified?   Yes    The patient is due for foot exam, shingles vaccine and DXA Scan, Hepatitis C Screening and [facility] Care Plan Letter.

## 2017-12-11 ENCOUNTER — Encounter (INDEPENDENT_AMBULATORY_CARE_PROVIDER_SITE_OTHER): Payer: Self-pay

## 2017-12-18 ENCOUNTER — Ambulatory Visit (HOSPITAL_BASED_OUTPATIENT_CLINIC_OR_DEPARTMENT_OTHER): Payer: Medicare Other | Admitting: Specialist

## 2017-12-18 ENCOUNTER — Other Ambulatory Visit (HOSPITAL_BASED_OUTPATIENT_CLINIC_OR_DEPARTMENT_OTHER): Payer: Medicare Other

## 2017-12-25 ENCOUNTER — Encounter (HOSPITAL_BASED_OUTPATIENT_CLINIC_OR_DEPARTMENT_OTHER): Payer: Self-pay | Admitting: Specialist

## 2017-12-25 ENCOUNTER — Ambulatory Visit (INDEPENDENT_AMBULATORY_CARE_PROVIDER_SITE_OTHER): Payer: Medicare Other

## 2017-12-25 ENCOUNTER — Ambulatory Visit (INDEPENDENT_AMBULATORY_CARE_PROVIDER_SITE_OTHER): Payer: Medicare Other | Admitting: Specialist

## 2017-12-25 VITALS — BP 104/62 | HR 86 | Ht 60.0 in | Wt 134.4 lb

## 2017-12-25 DIAGNOSIS — N185 Chronic kidney disease, stage 5: Secondary | ICD-10-CM

## 2017-12-25 NOTE — Procedures (Signed)
Full report to be scanned into EPIC

## 2017-12-25 NOTE — Progress Notes (Signed)
[facility]    Chief Complaint   Patient presents with   . Follow-up     Follow up for fistula on the left arm          History of Present Illness     Jacqueline Weber is a 67 y.o. female who presents Status post left brachiocephalic AV fistula.  She has no complaints    Past Medical History     Past Medical History:   Diagnosis Date   . Anemia 10/02/2016    H/O ANEMIA - REFLECTED ON CURRENT LABS.   [name] Cataracts, bilateral     H/O CATARACTS - SURGERY DONE - STILL HAS SOME ISSUES.   . Cerebrovascular accident 2014    H/O STROKE - WEAKNESS ON LEFT SIDE. NO NEUROLOGIST. USES CANE FOR BALANCE.   [name] Chronic renal failure, stage 5 05/08/2017   . Constipation    . Coronary artery disease 06/2016    H/O QUADRUPLE BYPASS SURGERY - FOLLOWED BY [facility].   . Diabetes mellitus    . Difficulty walking     WALKS WITH CANE. WILL USE WC IN HOSPITAL   . End-stage renal disease 04/2017    DIALYSIS [facility] [place] [facility]WED[facility]. HAS PERMACATH RIGHT CHEST   . Gastroesophageal reflux disease     MED EFFECTIVE    . History of MI (myocardial infarction)    . 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 Hypercholesteremia     CONTROLLED WITH MEDS.   [name] Hypertension     CONTROLLED WITH MEDS.   [name] Myocardial infarction 06/2016   . Pre-diabetes    . Renal insufficiency     CHRONIC KIDNEY DISEASE - FOLLOWED BY DR. Kate Sable.   [name] Shortness of breath     MODERATE EXERTION    . Type 2 diabetes mellitus, controlled     AM FSBS 150-200. INSTRUCTED TO CONTROL DIET CLOSELY TONIGHT.  HAD BEEN OFF JANUVIA, RESTARTED 06/15/17. HA1C 9 06/15/17. MANAGED BY PCP.        Allergies     Allergies   Allergen Reactions   . Mosquito (Culex Pipiens) Allergy Skin Test Shortness Of Breath     And  Trouble  breathing   . Shellfish Allergy Hives   . Shellfish-Derived Products Hives     New  allergy  New  allergy   . Lisinopril Hives and Rash     Patient cannot remember the exact nature of the allergy   . Penicillins Rash and Swelling     numbness  Swelling,  numbness        Medications     Current Outpatient Prescriptions on File Prior to Visit   Medication Sig Dispense Refill   . aspirin EC 325 MG EC tablet Take 1 tablet (325 mg total) by mouth daily. 30 tablet 0   . carvedilol (COREG) 3.125 MG tablet Take 3.125 mg by mouth daily.         . Cholecalciferol (VITAMIN D3) 2000 units Tab Take 4,000 IU by mouth daily.     . ferrous sulfate 324 (65 FE) MG Tablet Delayed Response TAKE 1 TABLET BY MOUTH IN THE MORNING WITH BREAKFAST 90 tablet 1   . fluticasone (FLONASE) 50 MCG/ACT nasal spray 2 sprays by Nasal route daily.2 sprays each nostril daily 1 Bottle 0   . fluticasone-salmeterol (ADVAIR DISKUS) 100-50 MCG/DOSE Aerosol Powder, Breath Activtivatede Inhale 1 puff into the lungs 2 (two) times daily. 1 each 0   .  Linagliptin (TRADJENTA) 5 MG Tab Take 1 tablet (5 mg total) by mouth daily. 90 tablet 1   . montelukast (SINGULAIR) 10 MG tablet as needed.     . Olopatadine HCl (PATADAY) 0.2 % Solution Apply 1 drop to eye daily. 2.5 mL 3   . ONE TOUCH ULTRA TEST test strip Check blood sugar 2 times daily 200 each 3   . pantoprazole (PROTONIX) 40 MG tablet TAKE ONE TABLET BY MOUTH IN THE MORNING BEFORE BREAKFAST 90 tablet 1   . RENVELA 800 MG tablet 1,600 mg 3 (three) times daily with meals.         . simvastatin (ZOCOR) 20 MG tablet Take 20 mg by mouth nightly.         . traMADol (ULTRAM) 50 MG tablet Take 50 mg by mouth every 6 (six) hours as needed for Pain.     [name] Umeclidinium Bromide (INCRUSE ELLIPTA IN) Inhale into the lungs.       No current facility-administered medications on file prior to visit.        Review of Systems     Constitutional: Negative for fevers and chills  Skin: No rash or lesions  Respiratory: Negative for cough, wheezing, or hemoptysis  Cardiovascular: as per HPI  Gastrointestinal: Negative for abdominal pain, nausea, vomiting and diarrhea  Musculoskeletal:  No arthritic symptoms  Genitourinary: Negative for dysuria  All other systems were reviewed and are negative  except what is stated in the HPI    Physical Exam     Vitals:    12/25/17 1128   BP: 104/62   Pulse: 86       Body mass index is 26.25 kg/m.    General: Patient appears their stated age, well-nourished. Alert and in no apparent distress.  Lungs: Respiratory effort unlabored, chest expansion symmetric.  Cardiac: RRR, no carotid bruits, no JVD.   Extremities warm,   Abd: Soft, nondistended, nontender. No guarding or rebound, No mid line pulsatile mass   LEX:NTZG ROM in all 4 extremities, symmetric , Excellent thrill with well matured left upper arm cephalic vein  Skin: Color appropriate for race, Skin warm, dry, no gangrene, no non healing ulcers, no varicose veins , no hyperpigmentation, no lipo-dermatosclerosis  Neuro: Good insight and judgment, oriented to person, place, and time CN II-XII intact, gross motor and sensory intact      Labs     CBC:   WBC   Date/Time Value Ref Range Status   11/15/2017 10:23 AM 7.28 3.10 - 9.50 x10 3/uL Final     RBC   Date/Time Value Ref Range Status   11/15/2017 10:23 AM 4.63 3.90 - 5.10 x10 6/uL Final     Hgb   Date/Time Value Ref Range Status   11/15/2017 10:23 AM 10.6 (L) 11.4 - 14.8 g/dL Final     Hematocrit   Date/Time Value Ref Range Status   11/15/2017 10:23 AM 36.8 34.7 - 43.7 % Final     MCV   Date/Time Value Ref Range Status   11/15/2017 10:23 AM 79.5 78.0 - 96.0 fL Final     MCHC   Date/Time Value Ref Range Status   11/15/2017 10:23 AM 28.8 (L) 31.5 - 35.8 g/dL Final     RDW   Date/Time Value Ref Range Status   11/15/2017 10:23 AM 15 11 - 15 % Final     Platelets   Date/Time Value Ref Range Status   11/15/2017 10:23 AM 152 142 - 346 x10 3/uL  Final       CMP:   Sodium   Date/Time Value Ref Range Status   11/15/2017 10:23 AM 139 136 - 145 mEq/L Final     Potassium   Date/Time Value Ref Range Status   11/15/2017 10:23 AM 5.1 3.5 - 5.1 mEq/L Final     Chloride   Date/Time Value Ref Range Status   11/15/2017 10:23 AM 95 (L) 100 - 111 mEq/L Final     CO2   Date/Time Value  Ref Range Status   11/15/2017 10:23 AM 31 (H) 21 - 29 mEq/L Final     Glucose   Date/Time Value Ref Range Status   11/15/2017 10:23 AM 124 (H) 70 - 100 mg/dL Final     Comment:     ADA guidelines for diabetes mellitus:  Fasting:  Equal to or greater than 126 mg/dL  Random:   Equal to or greater than 200 mg/dL       BUN   Date/Time Value Ref Range Status   11/15/2017 10:23 AM 46.0 (H) 7.0 - 19.0 mg/dL Final     Protein, Total   Date/Time Value Ref Range Status   11/15/2017 10:23 AM 7.5 6.0 - 8.3 g/dL Final   06/02/2016 04:21 AM 4.6 (L) 6.0 - 8.3 g/dL Final     Alkaline Phosphatase   Date/Time Value Ref Range Status   11/15/2017 10:23 AM 146 (H) 37 - 106 U/L Final     AST (SGOT)   Date/Time Value Ref Range Status   11/15/2017 10:23 AM 15 5 - 34 U/L Final     ALT   Date/Time Value Ref Range Status   11/15/2017 10:23 AM 12 0 - 55 U/L Final     Anion Gap   Date/Time Value Ref Range Status   06/21/2017 08:15 AM 11.0 5.0 - 15.0 Final       Lipid Panel   Cholesterol   Date/Time Value Ref Range Status   11/15/2017 10:23 AM 153 0 - 199 mg/dL Final     Triglycerides   Date/Time Value Ref Range Status   11/15/2017 10:23 AM 120 34 - 149 mg/dL Final     HDL   Date/Time Value Ref Range Status   11/15/2017 10:23 AM 72 40 - 9,999 mg/dL Final     Comment:     An HDL cholesterol <40 mg/dL is low and constitutes a  coronary heart disease risk factor, and HDL-C>59 mg/dL is  a negative risk factor for CHD.  Ref: American Heart Association; Circulation 2004         Coags:   PT   Date/Time Value Ref Range Status   06/21/2017 08:14 AM 13.0 12.6 - 15.0 sec Final     PT INR   Date/Time Value Ref Range Status   06/21/2017 08:14 AM 1.0 0.9 - 1.1 Final     Comment:     Recommended Ranges for Protime INR:    2.0-3.0 for most medical and surgical thromboembolic states    3.7-0.9 for artificial heart valves  INR result may not represent exact Warfarin dosing level during  the transition period from Heparin to Warfarin therapy.  Recommend close  clinical monitoring.       PTT   Date/Time Value Ref Range Status   06/21/2017 08:14 AM 37 23 - 37 sec Final     Comment:     In vivo therapeutic range of heparin (0.3 - 0.7 IU/mL)  correlate with the following APTT times: 64 -  102 seconds.         Assessment and Plan       1. Chronic renal failure, stage 5  [place] [facility]odialysis Access Duplex Dopp       Left brachiocephalic AV fistula has been used for past 3 months.  She has no complaints.  The ultrasound revealed moderate degree of stenosis at the cephalic arch, which can be observed at the present time.  I will be seeing her for routine follow-up back in the office in 3 months.    Dr. Kate Sable, I will elect to thank you for allowing me to participate in the care of this patient    With regards    Shary Key, MD, [place], [facility]  Chief, Section of Vascular [facility]

## 2018-01-01 ENCOUNTER — Telehealth (INDEPENDENT_AMBULATORY_CARE_PROVIDER_SITE_OTHER): Payer: Self-pay | Admitting: Internal Medicine

## 2018-01-02 ENCOUNTER — Other Ambulatory Visit (INDEPENDENT_AMBULATORY_CARE_PROVIDER_SITE_OTHER): Payer: Self-pay | Admitting: Specialist

## 2018-01-02 DIAGNOSIS — N185 Chronic kidney disease, stage 5: Secondary | ICD-10-CM

## 2018-01-03 ENCOUNTER — Other Ambulatory Visit (INDEPENDENT_AMBULATORY_CARE_PROVIDER_SITE_OTHER): Payer: Self-pay | Admitting: Internal Medicine

## 2018-01-10 ENCOUNTER — Encounter (INDEPENDENT_AMBULATORY_CARE_PROVIDER_SITE_OTHER): Payer: Self-pay

## 2018-01-23 ENCOUNTER — Other Ambulatory Visit (INDEPENDENT_AMBULATORY_CARE_PROVIDER_SITE_OTHER): Payer: Self-pay | Admitting: "Endocrinology

## 2018-01-23 MED ORDER — LINAGLIPTIN 5 MG PO TABS
5.0000 mg | ORAL_TABLET | Freq: Every day | ORAL | 1 refills | Status: DC
Start: 2018-01-23 — End: 2018-08-26

## 2018-01-23 NOTE — Telephone Encounter (Signed)
LOV 11/15/17  APPT 02/19/2018  Requested Prescriptions     Pending Prescriptions Disp Refills   . Linagliptin (TRADJENTA) 5 MG Tab 90 tablet 1     Sig: Take 1 tablet (5 mg total) by mouth daily       [facility] [address] - [place], [place] - [address]  [address]  [place] [address]  Phone: [phone] Fax: [phone]

## 2018-02-05 ENCOUNTER — Telehealth (INDEPENDENT_AMBULATORY_CARE_PROVIDER_SITE_OTHER): Payer: Self-pay | Admitting: Internal Medicine

## 2018-02-05 NOTE — Telephone Encounter (Signed)
Attempted to schedule AWV. When patient calls back please schedule with AWV RN. Thank you.

## 2018-02-10 ENCOUNTER — Encounter (INDEPENDENT_AMBULATORY_CARE_PROVIDER_SITE_OTHER): Payer: Self-pay

## 2018-02-19 ENCOUNTER — Ambulatory Visit (FREE_STANDING_LABORATORY_FACILITY): Payer: Medicare Other | Admitting: "Endocrinology

## 2018-02-19 ENCOUNTER — Encounter (INDEPENDENT_AMBULATORY_CARE_PROVIDER_SITE_OTHER): Payer: Self-pay | Admitting: "Endocrinology

## 2018-02-19 VITALS — BP 134/72 | HR 92 | Resp 16 | Ht 60.0 in | Wt 144.8 lb

## 2018-02-19 DIAGNOSIS — Z7984 Long term (current) use of oral hypoglycemic drugs: Secondary | ICD-10-CM

## 2018-02-19 DIAGNOSIS — E1122 Type 2 diabetes mellitus with diabetic chronic kidney disease: Secondary | ICD-10-CM

## 2018-02-19 DIAGNOSIS — I120 Hypertensive chronic kidney disease with stage 5 chronic kidney disease or end stage renal disease: Secondary | ICD-10-CM

## 2018-02-19 DIAGNOSIS — Z992 Dependence on renal dialysis: Secondary | ICD-10-CM

## 2018-02-19 DIAGNOSIS — N186 End stage renal disease: Secondary | ICD-10-CM

## 2018-02-19 LAB — CBC AND DIFFERENTIAL
Absolute NRBC: 0 10*3/uL (ref 0.00–0.00)
Basophils Absolute Automated: 0.01 10*3/uL (ref 0.00–0.08)
Basophils Automated: 0.2 %
Eosinophils Absolute Automated: 0.17 10*3/uL (ref 0.00–0.44)
Eosinophils Automated: 2.7 %
Hematocrit: 38.2 % (ref 34.7–43.7)
Hgb: 11.1 g/dL — ABNORMAL LOW (ref 11.4–14.8)
Immature Granulocytes Absolute: 0.02 10*3/uL (ref 0.00–0.07)
Immature Granulocytes: 0.3 %
Lymphocytes Absolute Automated: 1.95 10*3/uL (ref 0.42–3.22)
Lymphocytes Automated: 30.9 %
MCH: 23.7 pg — ABNORMAL LOW (ref 25.1–33.5)
MCHC: 29.1 g/dL — ABNORMAL LOW (ref 31.5–35.8)
MCV: 81.6 fL (ref 78.0–96.0)
MPV: 11.7 fL (ref 8.9–12.5)
Monocytes Absolute Automated: 0.63 10*3/uL (ref 0.21–0.85)
Monocytes: 10 %
Neutrophils Absolute: 3.53 10*3/uL (ref 1.10–6.33)
Neutrophils: 55.9 %
Nucleated RBC: 0 /100 WBC (ref 0.0–0.0)
Platelets: 252 10*3/uL (ref 142–346)
RBC: 4.68 10*6/uL (ref 3.90–5.10)
RDW: 15 % (ref 11–15)
WBC: 6.31 10*3/uL (ref 3.10–9.50)

## 2018-02-19 LAB — HEMOGLOBIN A1C
Average Estimated Glucose: 108.3 mg/dL
Hemoglobin A1C: 5.4 % (ref 4.6–5.9)

## 2018-02-19 NOTE — Progress Notes (Signed)
Subjective:      Date: 02/19/2018 11:33 AM   Patient ID: Jacqueline Weber is a 67 y.o. female.    Chief Complaint:  Chief Complaint   Patient presents with   . Diabetes     type 2 w/ ckd w/o ins       HPI    Visit type: follow up  Type: Type II and non-insulin requiring  Evaluation: diagnosed years ago  Last follow-up: 3 months ago     Diabetes Medication Regimen: Tradjenta 5 mg daily   Medication effectiveness/adherence: BS in the past 4 weeks have been above goal  and general adherence with medications  Medication side effects: none  Presenting symptoms at time of diagnosis: random glucose >200 mg/dL  Prior medications: Metformin d/c'd due to ESRD   Current symptoms: none  Patient denies: no other symptoms including chest pain, shortness of breath, hypoglycemic episodes, polydipsia, polyuria, headaches, dizziness, or abdominal pain  Exercise: walking  Diet: inconsistent compliance with and recommended diet; per patient's daughter, recently patient has been eating more carbs and has not been consistent with monitoring BS   Complications: ESRD on hemodialysis - started hemodialysis October 2018   Pertinent medical history: CAD, hyperlipidemia and stroke, ESRD on HD   Home glucose readings: Download 30 day period average 235; highest BS 339; lowest BS 155 ;   This has increased significantly from her previous visit 3 months ago     A1c result: 8.4 (07/25/17);  6.3 (March 2019)   A1c trend is variable  Microalbumin trend: No results found for: MICROALBUMIN - n/a due to ESRD on HD   Microalbumin trend is n/a  LDL trend:   Lab Results   Component Value Date    LDL 57 11/15/2017    LDL 83 10/16/2016    LDL 122 (H) 08/07/2016     LDL trend is decreasing  Additional concerns: In addition, pt has hyperlipidemia and is compliant with lipid therapy.  Pt denies side effects of lipid therapy - specifically denies myalgia. Has HTN, no evidence of CHF and is compliant with medications; hx of stroke 4-5 years ago with no residual  neuro deficits; Had quadruple bypass 1 year ago, currently no anginal symptoms; developed ESRD and started hemodialysis October 2018, dialysis days MWF.     Last eye exam was May 29, 2017 - no known retinopathy.     Hx of stroke 4 to 5 years ago, no neuro deficit.     Problem List:  Patient Active Problem List   Diagnosis   . HTN (hypertension) urgency   . Hyperlipidemia   . History of CVA (cerebrovascular accident)   . Elevated brain natriuretic peptide (BNP) level   . Hyponatremia   . NSTEMI (non-ST elevated myocardial infarction)   . End stage renal disease   . Acute bronchitis   . Anemia in chronic kidney disease (CODE)   . Iron deficiency anemia, unspecified iron deficiency anemia type   . Chronic renal failure, stage 5   . Diabetes mellitus   . Bronchiectasis       Current Medications:  Current Outpatient Prescriptions   Medication Sig Dispense Refill   . aspirin EC 325 MG EC tablet Take 1 tablet (325 mg total) by mouth daily. 30 tablet 0   . carvedilol (COREG) 3.125 MG tablet Take 3.125 mg by mouth daily.         . Cholecalciferol (VITAMIN D3) 2000 units Tab Take 4,000 IU by mouth daily.     [name]  ferrous sulfate 324 (65 FE) MG Tablet Delayed Response TAKE 1 TABLET BY MOUTH IN THE MORNING WITH BREAKFAST 90 tablet 1   . fluticasone (FLONASE) 50 MCG/ACT nasal spray 2 sprays by Nasal route daily.2 sprays each nostril daily 1 Bottle 0   . fluticasone-salmeterol (ADVAIR DISKUS) 100-50 MCG/DOSE Aerosol Powder, Breath Activtivatede Inhale 1 puff into the lungs 2 (two) times daily. 1 each 0   . Linagliptin (TRADJENTA) 5 MG Tab Take 1 tablet (5 mg total) by mouth daily 90 tablet 1   . montelukast (SINGULAIR) 10 MG tablet as needed.     . Olopatadine HCl (PATADAY) 0.2 % Solution Apply 1 drop to eye daily. 2.5 mL 3   . ONE TOUCH ULTRA TEST test strip Check blood sugar 2 times daily 200 each 3   . pantoprazole (PROTONIX) 40 MG tablet TAKE 1 TABLET BY MOUTH IN THE MORNING BEFORE BREAKFAST 90 tablet 1   . RENVELA 800 MG  tablet 1,600 mg 3 (three) times daily with meals.         . simvastatin (ZOCOR) 20 MG tablet Take 20 mg by mouth nightly.         . traMADol (ULTRAM) 50 MG tablet Take 50 mg by mouth every 6 (six) hours as needed for Pain.     [name] Umeclidinium Bromide (INCRUSE ELLIPTA IN) Inhale into the lungs.       No current facility-administered medications for this visit.        Allergies:  Allergies   Allergen Reactions   . Mosquito (Culex Pipiens) Allergy Skin Test Shortness Of Breath     And  Trouble  breathing   . Shellfish Allergy Hives   . Shellfish-Derived Products Hives     New  allergy  New  allergy   . Lisinopril Hives and Rash     Patient cannot remember the exact nature of the allergy   . Penicillins Rash and Swelling     numbness  Swelling,  numbness       Past Medical History:  Past Medical History:   Diagnosis Date   . Anemia 10/02/2016    H/O ANEMIA - REFLECTED ON CURRENT LABS.   [name] Cataracts, bilateral     H/O CATARACTS - SURGERY DONE - STILL HAS SOME ISSUES.   . Cerebrovascular accident 2014    H/O STROKE - WEAKNESS ON LEFT SIDE. NO NEUROLOGIST. USES CANE FOR BALANCE.   [name] Chronic renal failure, stage 5 05/08/2017   . Constipation    . Coronary artery disease 06/2016    H/O QUADRUPLE BYPASS SURGERY - FOLLOWED BY [facility].   . Diabetes mellitus    . Difficulty walking     WALKS WITH CANE. WILL USE WC IN HOSPITAL   . End-stage renal disease 04/2017    DIALYSIS [facility] MON-WED-FRI. HAS PERMACATH RIGHT CHEST   . Gastroesophageal reflux disease     MED EFFECTIVE    . History of MI (myocardial infarction)    . 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 Hypercholesteremia     CONTROLLED WITH MEDS.   [name] Hypertension     CONTROLLED WITH MEDS.   [name] Myocardial infarction 06/2016   . Pre-diabetes    . Renal insufficiency     CHRONIC KIDNEY DISEASE - FOLLOWED BY DR. Kate Sable.   [name] Shortness of breath     MODERATE EXERTION    . Type 2 diabetes mellitus, controlled     AM  FSBS 150-200. INSTRUCTED TO CONTROL DIET CLOSELY TONIGHT.  HAD  BEEN OFF JANUVIA, RESTARTED 06/15/17. HA1C 9 06/15/17. MANAGED BY PCP.        Past Surgical History:  Past Surgical History:   Procedure Laterality Date   . BRONCHOSCOPY, FIBEROPTIC N/A 06/21/2017    Procedure: BRONCHOSCOPY with brushing and wash;  Surgeon: Danae Chen, MD;  Location: [place] [facility] OR;  Service: Pulmonary;  Laterality: N/A;   . CORONARY ARTERY BYPASS N/A 06/12/2016    Procedure: Coronary Artery Bypass x 4.;  Surgeon: Bjorn Loser, MD;  Location: [facility] OR;  Service: Cardiothoracic;  Laterality: N/A;  LIMA to LDA  Vein to PDA  Vein to OM  Vein to Diagonal   . EGD, COLONOSCOPY N/A 10/04/2016   . EGD, COLONOSCOPY N/A 10/04/2016    Procedure: EGD w/ bx, COLONOSCOPY w/ bx, polypectomy;  Surgeon: Norton Blizzard, MD;  Location: [place] [facility] OR;  Service: Gastroenterology;  Laterality: N/A;   . ENDOSCOPIC,VEIN HARVEST N/A 06/12/2016    Procedure: Endoscopic, Left Leg Greater Saphenous Vein Harvest;  Surgeon: Bjorn Loser, MD;  Location: [facility] HEART OR;  Service: Cardiothoracic;  Laterality: N/A;  Left Leg Incision (medially, groin to ankle) @ 0742  Vein out of leg @ 0826  Vein prep complete @ 0845  Total time = 63 minutes   . FORMATION, UPPER EXTREMITY, A-V FISTULA Left 05/31/2017    Procedure: FORMATION, UPPER EXTREMITY, A-V FISTULA;  Surgeon: Cherene Altes, MD;  Location: [place] MAIN OR;  Service: Vascular;  Laterality: Left;  LEFT ARM A-V FISTULA     . REVISION, A-V FISTULA UPPER EXTREMITY Left 07/16/2017    Procedure: REVISION, A-V FISTULA UPPER EXTREMITY WITH PATCH ANGIOPLASTY;  Surgeon: Cherene Altes, MD;  Location: [place] MA[facility] OR;  Service: Vascular;  Laterality: Left;       Family History:  Family History   Problem Relation Age of Onset   . No known problems Mother    . No known problems Father        Social History:  Social History     Social History   . Marital status: Widowed     Spouse name: N/A   . Number of children: N/A   . Years of education: N/A      Occupational History   . Not on file.     Social History Main Topics   . Smoking status: Never Smoker   . Smokeless tobacco: Never Used   . Alcohol use No   . Drug use: No   . Sexual activity: No     Other Topics Concern   . Not on file     Social History Narrative   . No narrative on file       The following sections were reviewed this encounter by the provider:   Tobacco  Allergies  Meds  Med Hx  Surg Hx  Fam Hx  Soc Hx        Vitals:  BP 134/72 (BP Site: Left arm, Patient Position: Sitting, Cuff Size: Medium)   Pulse 92   Resp 16   Ht 1.524 m (5')   Wt 65.7 kg (144 lb 12.8 oz)   BMI 28.28 kg/m      ROS: A complete 12 point ROS was obtained. Pertinent positives and negatives as noted in HPI. All other systems are negative.    Physical Examination     General appearance: alert, appears stated age, cooperative and no  distress  Eyes: conjunctivae/corneas clear. PERRL, EOM's intact.   Neck: no cervical lymphadenopathy, no carotid bruit,  supple, symmetrical, trachea midline   Thyroid:normal sized on inspection; non tender to palpation and no discrete nodules palpated  Lungs: clear to auscultation bilaterally  Heart: regular rate and rhythm, S1, S2 normal, no murmur, click, rub or gallop  Abdomen: soft, non-tender; bowel sounds normal; no masses,  no organomegaly  Extremities: extremities normal, atraumatic, no cyanosis or edema  Skin: Skin color, texture, turgor normal. No rashes or lesions  Neurologic: Alert and oriented X 3, normal strength and tone. Normal symmetric reflexes. No tremor    Assessment and Plan     1. Type 2 diabetes mellitus with chronic kidney disease on chronic dialysis, without long-term current use of insulin  -   Lab Results   Component Value Date    HGBA1C 6.3 (H) 11/15/2017    HGBA1C 8.4 (H) 07/24/2017    HGBA1C 9.0 (A) 06/15/2017       Current A1C is at goal (goal A1C<7.0%). Recently sugars have been elevated due to non compliance with carb controlled diet.   Continue current  DM medication regimen. will add to regimen upon review of A1C.   Recommend home glucometer testing twice a day (pre-breakfast or 2 hrs post-prandial).  Recommend therapeutic lifestyle changes which include optimizing low carbohydrate diet and aerobic exercise efforts.     - Hemoglobin A1c  - Comprehensive metabolic panel  - CBC and differential      2. Hyperlipidemia  -  LDL at goal < 100    - on statin and well tolerated    3. Hypertension  - well controlled     - on Amlodipine 10 mg daily, Carvedilol 25 mg BID, Clonidine 0.1 mg daily, Hydralazine 100 mg TID    4. Stroke   - no residual neuro deficits  - on Aspirin and statin for secondary stroke prevention    5. ESRD on HD  -   - tolerating HD without complications  - dialysis days MWF     RTC in 3 months      Ernesto Rutherford MSN FNP-BC  [facility]

## 2018-02-20 LAB — COMPREHENSIVE METABOLIC PANEL
ALT: 10 U/L (ref 0–55)
AST (SGOT): 16 U/L (ref 5–34)
Albumin/Globulin Ratio: 0.9 (ref 0.9–2.2)
Albumin: 3.7 g/dL (ref 3.5–5.0)
Alkaline Phosphatase: 161 U/L — ABNORMAL HIGH (ref 37–106)
BUN: 60 mg/dL — ABNORMAL HIGH (ref 7.0–19.0)
Bilirubin, Total: 0.4 mg/dL (ref 0.2–1.2)
CO2: 30 mEq/L — ABNORMAL HIGH (ref 21–29)
Calcium: 9.6 mg/dL (ref 8.5–10.5)
Chloride: 91 mEq/L — ABNORMAL LOW (ref 100–111)
Creatinine: 5.6 mg/dL — ABNORMAL HIGH (ref 0.4–1.5)
Globulin: 4 g/dL — ABNORMAL HIGH (ref 2.0–3.7)
Glucose: 167 mg/dL — ABNORMAL HIGH (ref 70–100)
Potassium: 5.3 mEq/L — ABNORMAL HIGH (ref 3.5–5.1)
Protein, Total: 7.7 g/dL (ref 6.0–8.3)
Sodium: 139 mEq/L (ref 136–145)

## 2018-02-20 LAB — GFR: EGFR: 7.6

## 2018-02-20 LAB — HEMOLYSIS INDEX: Hemolysis Index: 9 (ref 0–18)

## 2018-02-22 ENCOUNTER — Telehealth (INDEPENDENT_AMBULATORY_CARE_PROVIDER_SITE_OTHER): Payer: Self-pay

## 2018-02-22 NOTE — Progress Notes (Signed)
I have been unable to reach this patient by phone.  A letter is being sent to the last known home address.

## 2018-02-22 NOTE — Telephone Encounter (Signed)
-----   Message from Stacie Glaze, [place] sent at 02/20/2018 10:44 AM EDT -----  1) non fasting sugar 167 which is at target goal  2) A1C is 5.4 % estimated 3 month average sugars 108.3, please continue Tradjenta 5 mg daily.    3) Hemoglobin is slightly low, anemia due to CKD.

## 2018-02-22 NOTE — Telephone Encounter (Signed)
I have been unable to reach this patient by phone.  A letter is being sent to the last known home address.

## 2018-03-12 ENCOUNTER — Encounter (INDEPENDENT_AMBULATORY_CARE_PROVIDER_SITE_OTHER): Payer: Self-pay

## 2018-03-19 ENCOUNTER — Ambulatory Visit (INDEPENDENT_AMBULATORY_CARE_PROVIDER_SITE_OTHER): Payer: Self-pay | Admitting: Cardiovascular Disease

## 2018-04-09 ENCOUNTER — Ambulatory Visit (HOSPITAL_BASED_OUTPATIENT_CLINIC_OR_DEPARTMENT_OTHER): Payer: Medicare Other | Admitting: Specialist

## 2018-04-09 ENCOUNTER — Other Ambulatory Visit (HOSPITAL_BASED_OUTPATIENT_CLINIC_OR_DEPARTMENT_OTHER): Payer: Medicare Other

## 2018-04-09 ENCOUNTER — Other Ambulatory Visit (INDEPENDENT_AMBULATORY_CARE_PROVIDER_SITE_OTHER): Payer: Self-pay | Admitting: Internal Medicine

## 2018-04-09 DIAGNOSIS — R0982 Postnasal drip: Secondary | ICD-10-CM

## 2018-04-09 DIAGNOSIS — J302 Other seasonal allergic rhinitis: Secondary | ICD-10-CM

## 2018-04-12 ENCOUNTER — Encounter (INDEPENDENT_AMBULATORY_CARE_PROVIDER_SITE_OTHER): Payer: Self-pay

## 2018-04-16 ENCOUNTER — Ambulatory Visit (INDEPENDENT_AMBULATORY_CARE_PROVIDER_SITE_OTHER): Payer: Medicare Other | Admitting: Internal Medicine

## 2018-04-23 ENCOUNTER — Encounter (INDEPENDENT_AMBULATORY_CARE_PROVIDER_SITE_OTHER): Payer: Self-pay | Admitting: Internal Medicine

## 2018-04-23 ENCOUNTER — Ambulatory Visit (INDEPENDENT_AMBULATORY_CARE_PROVIDER_SITE_OTHER): Payer: Medicare Other | Admitting: Internal Medicine

## 2018-04-23 VITALS — BP 150/68 | HR 88 | Temp 98.3°F | Wt 147.0 lb

## 2018-04-23 DIAGNOSIS — N186 End stage renal disease: Secondary | ICD-10-CM

## 2018-04-23 DIAGNOSIS — E7800 Pure hypercholesterolemia, unspecified: Secondary | ICD-10-CM

## 2018-04-23 DIAGNOSIS — E1122 Type 2 diabetes mellitus with diabetic chronic kidney disease: Secondary | ICD-10-CM

## 2018-04-23 DIAGNOSIS — I120 Hypertensive chronic kidney disease with stage 5 chronic kidney disease or end stage renal disease: Secondary | ICD-10-CM

## 2018-04-23 DIAGNOSIS — I10 Essential (primary) hypertension: Secondary | ICD-10-CM

## 2018-04-23 DIAGNOSIS — R05 Cough: Secondary | ICD-10-CM

## 2018-04-23 DIAGNOSIS — R059 Cough, unspecified: Secondary | ICD-10-CM

## 2018-04-23 NOTE — Progress Notes (Signed)
Have you seen any specialists/other providers since your last visit with [place]?    No    Arm preference verified?   Yes    The patient is due for shingles vaccine and Physical

## 2018-04-23 NOTE — Progress Notes (Signed)
Subjective:      Date: 04/23/2018 11:53 AM   Patient ID: Jacqueline Weber is a 67 y.o. female.    Chief Complaint:  Chief Complaint   Patient presents with   . Hypertension      6 months follow up       HPI:  Hypertension   The patient is being seen for a routine follow-up of hypertension. Hypertension is classified as essential. Comorbid illnesses include hyperlipidemia, diabetes and end-stage renal disease. There is no interval history of chest pain, dyspnea and palpitations.  Current therapy includes beta blockers. Patient is compliant with the current regimen.   The patient's blood pressure response to medications is above goal. Daughter reports normally at goal    Hyperlipidemia   The patient is being seen for a routine follow-up of hyperlipidemia. Hyperlipidemia is classified as pure hypercholesterolemia. Comorbid illnesses include hypertension, diabetes and end-stage renal disease.  There is no interval history of palpitations, myalgias, chest pain and dyspnea. Current therapy includes a statin.  Patient is compliant with the current regimen. The LDL is at goal.      Diabetes   Diabetes is classified as type II and non-insulin requiring. Diabetes was diagnosed years ago. Diabetic complications include ESRD on hemodialysis. Comorbid illnesses include hyperlipidemia and hypertension. tradjenta Patient is compliant with the current regimen. The patient's prior A1c result is at goal < 6.5.     Cough  Saw pulmonologist yesterday  Will be getting cxr  Will be starting prednisone and zpak today    Problem List:  Patient Active Problem List   Diagnosis   . HTN (hypertension) urgency   . Hyperlipidemia   . History of CVA (cerebrovascular accident)   . Elevated brain natriuretic peptide (BNP) level   . Hyponatremia   . NSTEMI (non-ST elevated myocardial infarction)   . End stage renal disease   . Acute bronchitis   . Anemia in chronic kidney disease (CODE)   . Iron deficiency anemia, unspecified iron deficiency anemia  type   . Chronic renal failure, stage 5   . Diabetes mellitus   . Bronchiectasis       Current Medications:  Outpatient Prescriptions Marked as Taking for the 04/23/18 encounter (Office Visit) with John Giovanni, MD   Medication Sig Dispense Refill   . ADVAIR DISKUS 250-50 MCG/DOSE Aerosol Powder, Breath Activtivatede INHALE 1 DOSE BY MOUTH TWICE DAILY  5   . aspirin EC 325 MG EC tablet Take 1 tablet (325 mg total) by mouth daily. 30 tablet 0   . carvedilol (COREG) 3.125 MG tablet Take 3.125 mg by mouth daily.         . Cholecalciferol (VITAMIN D3) 2000 units Tab Take 4,000 IU by mouth daily.     . ferrous sulfate 324 (65 FE) MG Tablet Delayed Response TAKE 1 TABLET BY MOUTH IN THE MORNING WITH BREAKFAST 90 tablet 1   . fluticasone (FLONASE) 50 MCG/ACT nasal spray USE 2 SPRAY(S) IN EACH NOSTRIL ONCE DAILY 1 Bottle 3   . Linagliptin (TRADJENTA) 5 MG Tab Take 1 tablet (5 mg total) by mouth daily 90 tablet 1   . montelukast (SINGULAIR) 10 MG tablet as needed.     . Olopatadine HCl (PATADAY) 0.2 % Solution Apply 1 drop to eye daily. 2.5 mL 3   . ONE TOUCH ULTRA TEST test strip Check blood sugar 2 times daily 200 each 3   . pantoprazole (PROTONIX) 40 MG tablet TAKE 1 TABLET BY MOUTH IN THE  MORNING BEFORE BREAKFAST 90 tablet 1   . RENVELA 800 MG tablet 1,600 mg 3 (three) times daily with meals.         . simvastatin (ZOCOR) 20 MG tablet Take 20 mg by mouth nightly.         [name] Umeclidinium Bromide (INCRUSE ELLIPTA IN) Inhale into the lungs.         Allergies:  Allergies   Allergen Reactions   . Mosquito (Culex Pipiens) Allergy Skin Test Shortness Of Breath     And  Trouble  breathing   . Shellfish Allergy Hives   . Shellfish-Derived Products Hives     New  allergy  New  allergy   . Lisinopril Hives and Rash     Patient cannot remember the exact nature of the allergy   . Penicillins Rash and Swelling     numbness  Swelling,  numbness       Past Medical History:  Past Medical History:   Diagnosis Date   . Anemia 10/02/2016     H/O ANEMIA - REFLECTED ON CURRENT LABS.   [name] Cataracts, bilateral     H/O CATARACTS - SURGERY DONE - STILL HAS SOME ISSUES.   . Cerebrovascular accident 2014    H/O STROKE - WEAKNESS ON LEFT SIDE. NO NEUROLOGIST. USES CANE FOR BALANCE.   [name] Chronic renal failure, stage 5 05/08/2017   . Constipation    . Coronary artery disease 06/2016    H/O QUADRUPLE BYPASS SURGERY - FOLLOWED BY [facility].   . Diabetes mellitus    . Difficulty walking     WALKS WITH CANE. WILL USE WC IN HOSPITAL   . End-stage renal disease 04/2017    DIALYSIS [facility] MON-WED-FRI. HAS PERMACATH RIGHT CHEST   . Gastroesophageal reflux disease     MED EFFECTIVE    . History of MI (myocardial infarction)    . 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 Hypercholesteremia     CONTROLLED WITH MEDS.   [name] Hypertension     CONTROLLED WITH MEDS.   [name] Myocardial infarction 06/2016   . Pre-diabetes    . Renal insufficiency     CHRONIC KIDNEY DISEASE - FOLLOWED BY DR. Kate Sable.   [name] Shortness of breath     MODERATE EXERTION    . Type 2 diabetes mellitus, controlled     AM FSBS 150-200. INSTRUCTED TO CONTROL DIET CLOSELY TONIGHT.  HAD BEEN OFF JANUVIA, RESTARTED 06/15/17. HA1C 9 06/15/17. MANAGED BY PCP.        Past Surgical History:  Past Surgical History:   Procedure Laterality Date   . BRONCHOSCOPY, FIBEROPTIC N/A 06/21/2017    Procedure: BRONCHOSCOPY with brushing and wash;  Surgeon: Danae Chen, MD;  Location: [place] [facility] OR;  Service: Pulmonary;  Laterality: N/A;   . CORONARY ARTERY BYPASS N/A 06/12/2016    Procedure: Coronary Artery Bypass x 4.;  Surgeon: Bjorn Loser, MD;  Location: [facility] HEART OR;  Service: Cardiothoracic;  Laterality: N/A;  LIMA to LDA  Vein to PDA  Vein to OM  Vein to Diagonal   . EGD, COLONOSCOPY N/A 10/04/2016   . EGD, COLONOSCOPY N/A 10/04/2016    Procedure: EGD w/ bx, COLONOSCOPY w/ bx, polypectomy;  Surgeon: Norton Blizzard, MD;  Location: [place] [facility]COPY OR;  Service: Gastroenterology;  Laterality: N/A;   .  ENDOSCOPIC,VEIN HARVEST N/A 06/12/2016    Procedure: Endoscopic, Left Leg Greater Saphenous Vein Harvest;  Surgeon: Conchita Paris,  Carolann Littler, MD;  Location: [facility] HEART OR;  Service: Cardiothoracic;  Laterality: N/A;  Left Leg Incision (medially, groin to ankle) @ 0742  Vein out of leg @ 0826  Vein prep complete @ 0845  Total time = 63 minutes   . FORMATION, UPPER EXTREMITY, A-V FISTULA Left 05/31/2017    Procedure: FORMATION, UPPER EXTREMITY, A-V FISTULA;  Surgeon: Cherene Altes, MD;  Location: [place] MAIN OR;  Service: Vascular;  Laterality: Left;  LEFT ARM A-V FISTULA     . REVISION, A-V FISTULA UPPER EXTREMITY Left 07/16/2017    Procedure: REVISION, A-V FISTULA UPPER EXTREMITY WITH PATCH ANGIOPLASTY;  Surgeon: Cherene Altes, MD;  Location: [place] MAIN OR;  Service: Vascular;  Laterality: Left;       Family History:  Family History   Problem Relation Age of Onset   . No known problems Mother    . No known problems Father        Social History:  Social History     Social History   . Marital status: Widowed     Spouse name: N/A   . Number of children: N/A   . Years of education: N/A     Occupational History   . Not on file.     Social History Main Topics   . Smoking status: Never Smoker   . Smokeless tobacco: Never Used   . Alcohol use No   . Drug use: No   . Sexual activity: No     Other Topics Concern   . Not on file     Social History Narrative   . No narrative on file       The following sections were reviewed this encounter by the provider:   Allergies  Meds  Problems         Vitals:  BP 150/68 (BP Site: Right arm, Patient Position: Sitting, Cuff Size: Medium)   Pulse 88   Temp 98.3 F (36.8 C) (Oral)   Wt 66.7 kg (147 lb)   SpO2 94%   BMI 28.71 kg/m          ROS:  Review of Systems   Constitutional: Positive for fatigue (after dialysis).   Respiratory: Positive for cough and shortness of breath (just saw pulmonologist). Negative for wheezing.    Cardiovascular: Negative for chest pain,  palpitations and leg swelling.   Gastrointestinal: Negative for abdominal pain, diarrhea, nausea and vomiting.   Neurological: Negative for weakness, numbness and headaches.         Objective:       Physical Exam:  General Examination:   GENERAL APPEARANCE: alert, in no acute distress, well developed, well nourished, oriented to time, place, and person.   HEAD: normal appearance, atraumatic.   EYES: extraocular movement intact (EOMI), pupils equal, round, reactive to light and accommodation, sclera anicteric, conjunctiva clear.    ORAL CAVITY: normal oropharynx, normal lips, mucosa moist, no lesions.   THROAT: normal appearance, clear, no erythema.   NECK/THYROID: neck supple,  no cervical lymphadenopathy, no neck mass palpated, no thyromegaly.   LYMPH NODES: no palpable adenopathy.   SKIN: good turgor, no rashes, no suspicious lesions.   HEART: S1, S2 normal, no murmurs, rubs, gallops, regular rate and rhythm.   LUNGS: normal effort / no distress, expiratory rales  MUSCULOSKELETAL: full range of motion, no swelling or deformity.   EXTREMITIES: no edema  NEUROLOGIC: nonfocal, cranial nerves 2-12 grossly intact, normal strength and tone, sensory exam intact.  PSYCH: cognitive function intact, mood/affect full range, speech clear.      Assessment/Plan:       1. Essential hypertension  Slightly above goal but was normotensive at cardiologists office  Will continue current meds  Pt will f/u with cardiology    2. Pure hypercholesterolemia  At goal, continue current meds    3. Diabetes mellitus with end stage renal disease  Controlled, continue current meds  F/u with endocrine    4. Cough  Start zpak and prednisone  F/u with pulmonary    Spent 25 minutes face to face time with patient, more than 50% of time spent in counseling and care coordination      John Giovanni, MD

## 2018-05-13 ENCOUNTER — Encounter (INDEPENDENT_AMBULATORY_CARE_PROVIDER_SITE_OTHER): Payer: Self-pay

## 2018-05-13 NOTE — Progress Notes (Signed)
:  Room        VASCULAR SURGERY PATIENT FORM       Appt Date/Time: 05/14/2018   PCP: John Giovanni, MD  Referring Physician:    New Patient Follow-Up Post-op   [place] [facility] Other Imaging     Chief Complaint/HPI:  Hx left brachiocephalic AV fistula. Date of Last Visit: 04/23   Allergies   Allergen Reactions   . Mosquito (Culex Pipiens) Allergy Skin Test Shortness Of Breath     And  Trouble  breathing   . Shellfish Allergy Hives   . Shellfish-Derived Products Hives     New  allergy  New  allergy   . Lisinopril Hives and Rash     Patient cannot remember the exact nature of the allergy   . Penicillins Rash and Swelling     numbness  Swelling,  numbness        Selected Medications:    Coumadin Plavix Eliquis Xarelto Aspirin Fish Oil Lovenox   Insulin Metformin Other Diabetes Atorvastatin  Other Cholesterol    MHx:  Stroke/TIA/Seizures Thyroid  Diabetes   Myocardial Infarction Arrhythmia  COPD/Asthma (other lung)   Kidney Disease Liver Disease DVT   Wounds Musculoskeletal (spine) Cancer             Smoker           Former Smoker      x    Never Smoker    Vital Signs this Visit Pulses  HT WT HR   Rad Ulnar Brach Fem Pop DP PT       L          BP L BP R SpO2  R                        Imaging results:           Plan of Care:

## 2018-05-14 ENCOUNTER — Encounter (HOSPITAL_BASED_OUTPATIENT_CLINIC_OR_DEPARTMENT_OTHER): Payer: Self-pay | Admitting: Specialist

## 2018-05-14 ENCOUNTER — Ambulatory Visit (INDEPENDENT_AMBULATORY_CARE_PROVIDER_SITE_OTHER): Payer: Medicare Other | Admitting: Specialist

## 2018-05-14 ENCOUNTER — Ambulatory Visit (INDEPENDENT_AMBULATORY_CARE_PROVIDER_SITE_OTHER): Payer: Medicare Other

## 2018-05-14 VITALS — BP 123/69 | HR 78 | Temp 98.2°F | Ht 60.0 in | Wt 147.7 lb

## 2018-05-14 DIAGNOSIS — N185 Chronic kidney disease, stage 5: Secondary | ICD-10-CM

## 2018-05-14 NOTE — Progress Notes (Signed)
[facility]    Chief Complaint   Patient presents with   . Follow-up     L REVISION, A-V FISTULA UPPER EXTREMITY  07/2017         History of Present Illness     Jacqueline Weber is a 67 y.o. female who presents for routine follow-up post left brachiobasilic AV fistula approximately 7 months ago.  Patient complains of some excess bleeding    Past Medical History     Past Medical History:   Diagnosis Date   . Anemia 10/02/2016    H/O ANEMIA - REFLECTED ON CURRENT LABS.   [name] Cataracts, bilateral     H/O CATARACTS - SURGERY DONE - STILL HAS SOME ISSUES.   . Cerebrovascular accident 2014    H/O STROKE - WEAKNESS ON LEFT SIDE. NO NEUROLOGIST. USES CANE FOR BALANCE.   [name] Chronic renal failure, stage 5 05/08/2017   . Constipation    . Coronary artery disease 06/2016    H/O QUADRUPLE BYPASS SURGERY - FOLLOWED BY [facility].   . Diabetes mellitus    . Difficulty walking     WALKS WITH CANE. WILL USE WC IN HOSPITAL   . End-stage renal disease 04/2017    DIALYSIS [facility] [place] MON-WED-FRI. HAS PERMACATH RIGHT CHEST   . Gastroesophageal reflux disease     MED EFFECTIVE    . History of MI (myocardial infarction)    . 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 Hypercholesteremia     CONTROLLED WITH MEDS.   [name] Hypertension     CONTROLLED WITH MEDS.   [name] Myocardial infarction 06/2016   . Pre-diabetes    . Renal insufficiency     CHRONIC KIDNEY DISEASE - FOLLOWED BY DR. Kate Weber.   [name] Shortness of breath     MODERATE EXERTION    . Type 2 diabetes mellitus, controlled     AM FSBS 150-200. INSTRUCTED TO CONTROL DIET CLOSELY TONIGHT.  HAD BEEN OFF JANUVIA, RESTARTED 06/15/17. HA1C 9 06/15/17. MANAGED BY PCP.        Allergies     Allergies   Allergen Reactions   . Mosquito (Culex Pipiens) Allergy Skin Test Shortness Of Breath     And  Trouble  breathing   . Shellfish Allergy Hives   . Shellfish-Derived Products Hives     New  allergy  New  allergy   . Lisinopril Hives and Rash     Patient cannot remember the exact nature of the allergy   .  Penicillins Rash and Swelling     numbness  Swelling,  numbness       Medications     Current Outpatient Prescriptions on File Prior to Visit   Medication Sig Dispense Refill   . ADVAIR DISKUS 250-50 MCG/DOSE Aerosol Powder, Breath Activtivatede INHALE 1 DOSE BY MOUTH TWICE DAILY  5   . aspirin EC 325 MG EC tablet Take 1 tablet (325 mg total) by mouth daily. 30 tablet 0   . carvedilol (COREG) 3.125 MG tablet Take 3.125 mg by mouth daily.         . Cholecalciferol (VITAMIN D3) 2000 units Tab Take 4,000 IU by mouth daily.     . ferrous sulfate 324 (65 FE) MG Tablet Delayed Response TAKE 1 TABLET BY MOUTH IN THE MORNING WITH BREAKFAST 90 tablet 1   . fluticasone (FLONASE) 50 MCG/ACT nasal spray USE 2 SPRAY(S) IN EACH NOSTRIL ONCE DAILY 1 Bottle 3   .  Linagliptin (TRADJENTA) 5 MG Tab Take 1 tablet (5 mg total) by mouth daily 90 tablet 1   . montelukast (SINGULAIR) 10 MG tablet as needed.     . Olopatadine HCl (PATADAY) 0.2 % Solution Apply 1 drop to eye daily. 2.5 mL 3   . ONE TOUCH ULTRA TEST test strip Check blood sugar 2 times daily 200 each 3   . pantoprazole (PROTONIX) 40 MG tablet TAKE 1 TABLET BY MOUTH IN THE MORNING BEFORE BREAKFAST 90 tablet 1   . RENVELA 800 MG tablet 1,600 mg 3 (three) times daily with meals.         . simvastatin (ZOCOR) 20 MG tablet Take 20 mg by mouth nightly.         [name] Umeclidinium Bromide (INCRUSE ELLIPTA IN) Inhale into the lungs daily           No current facility-administered medications on file prior to visit.        Review of Systems     Constitutional: Negative for fevers and chills  Skin: No rash or lesions  Respiratory: Negative for cough, wheezing, or hemoptysis  Cardiovascular: as per HPI  Gastrointestinal: Negative for abdominal pain, nausea, vomiting and diarrhea  Musculoskeletal:  No arthritic symptoms  Genitourinary: Negative for dysuria  All other systems were reviewed and are negative except what is stated in the HPI    Physical Exam     Vitals:    05/14/18 1104   BP:  123/69   Pulse: 78   Temp:    SpO2:        Body mass index is 28.85 kg/m.    General: Patient appears their stated age, well-nourished. Alert and in no apparent distress.  Lungs: Respiratory effort unlabored, chest expansion symmetric.  Cardiac: RRR, no carotid bruits, no JVD.   Extremities warm,   Abd: Soft, nondistended, nontender. No guarding or rebound, No mid line pulsatile mass   UUV:OZDG ROM in all 4 extremities, symmetric , the fistula is somewhat pulsatile  Skin: Color appropriate for race, Skin warm, dry, no gangrene, no non healing ulcers, no varicose veins , no hyperpigmentation, no lipo-dermatosclerosis  Neuro: Good insight and judgment, oriented to person, place, and time CN II-XII intact, gross motor and sensory intact      Labs     CBC:   WBC   Date/Time Value Ref Range Status   02/19/2018 11:52 AM 6.31 3.10 - 9.50 x10 3/uL Final     RBC   Date/Time Value Ref Range Status   02/19/2018 11:52 AM 4.68 3.90 - 5.10 x10 6/uL Final     Hgb   Date/Time Value Ref Range Status   02/19/2018 11:52 AM 11.1 (L) 11.4 - 14.8 g/dL Final     Hematocrit   Date/Time Value Ref Range Status   02/19/2018 11:52 AM 38.2 34.7 - 43.7 % Final     MCV   Date/Time Value Ref Range Status   02/19/2018 11:52 AM 81.6 78.0 - 96.0 fL Final     MCHC   Date/Time Value Ref Range Status   02/19/2018 11:52 AM 29.1 (L) 31.5 - 35.8 g/dL Final     RDW   Date/Time Value Ref Range Status   02/19/2018 11:52 AM 15 11 - 15 % Final     Platelets   Date/Time Value Ref Range Status   02/19/2018 11:52 AM 252 142 - 346 x10 3/uL Final       CMP:   Sodium   Date/Time Value  Ref Range Status   02/19/2018 11:52 AM 139 136 - 145 mEq/L Final     Potassium   Date/Time Value Ref Range Status   02/19/2018 11:52 AM 5.3 (H) 3.5 - 5.1 mEq/L Final     Chloride   Date/Time Value Ref Range Status   02/19/2018 11:52 AM 91 (L) 100 - 111 mEq/L Final     CO2   Date/Time Value Ref Range Status   02/19/2018 11:52 AM 30 (H) 21 - 29 mEq/L Final     Glucose   Date/Time  Value Ref Range Status   02/19/2018 11:52 AM 167 (H) 70 - 100 mg/dL Final     Comment:     ADA guidelines for diabetes mellitus:  Fasting:  Equal to or greater than 126 mg/dL  Random:   Equal to or greater than 200 mg/dL       BUN   Date/Time Value Ref Range Status   02/19/2018 11:52 AM 60.0 (H) 7.0 - 19.0 mg/dL Final     Protein, Total   Date/Time Value Ref Range Status   02/19/2018 11:52 AM 7.7 6.0 - 8.3 g/dL Final   06/02/2016 04:21 AM 4.6 (L) 6.0 - 8.3 g/dL Final     Alkaline Phosphatase   Date/Time Value Ref Range Status   02/19/2018 11:52 AM 161 (H) 37 - 106 U/L Final     AST (SGOT)   Date/Time Value Ref Range Status   02/19/2018 11:52 AM 16 5 - 34 U/L Final     ALT   Date/Time Value Ref Range Status   02/19/2018 11:52 AM 10 0 - 55 U/L Final     Anion Gap   Date/Time Value Ref Range Status   06/21/2017 08:15 AM 11.0 5.0 - 15.0 Final       Lipid Panel   Cholesterol   Date/Time Value Ref Range Status   11/15/2017 10:23 AM 153 0 - 199 mg/dL Final     Triglycerides   Date/Time Value Ref Range Status   11/15/2017 10:23 AM 120 34 - 149 mg/dL Final     HDL   Date/Time Value Ref Range Status   11/15/2017 10:23 AM 72 40 - 9,999 mg/dL Final     Comment:     An HDL cholesterol <40 mg/dL is low and constitutes a  coronary heart disease risk factor, and HDL-C>59 mg/dL is  a negative risk factor for CHD.  Ref: American Heart Association; Circulation 2004         Coags:   PT   Date/Time Value Ref Range Status   06/21/2017 08:14 AM 13.0 12.6 - 15.0 sec Final     PT INR   Date/Time Value Ref Range Status   06/21/2017 08:14 AM 1.0 0.9 - 1.1 Final     Comment:     Recommended Ranges for Protime INR:    2.0-3.0 for most medical and surgical thromboembolic states    4.4-6.2 for artificial heart valves  INR result may not represent exact Warfarin dosing level during  the transition period from Heparin to Warfarin therapy.  Recommend close clinical monitoring.       PTT   Date/Time Value Ref Range Status   06/21/2017 08:14 AM 37 23  - 37 sec Final     Comment:     In vivo therapeutic range of heparin (0.3 - 0.7 IU/mL)  correlate with the following APTT times: 64 - 102 seconds.         Assessment and Plan  1. Chronic renal failure, stage 5  [place] [facility] Access Duplex Dopp       Left brachiocephalic AV fistula has been used for past 7 months.  Patient has developed excess bleeding with significant cephalic arch stenosis which will require stent angioplasty.  I have referred her to the access center for the above procedure and  I will be seeing her for routine follow-up back in the office in follow-up.  This was explained to the patient's family members and have agreed    Dr. Kate Weber, I will elect to thank you for allowing me to participate in the care of this patient    With regards    Jacqueline Key, MD, [facility], [facility]  Chief, Section of Vascular Surgery  [facility]

## 2018-05-21 ENCOUNTER — Encounter (INDEPENDENT_AMBULATORY_CARE_PROVIDER_SITE_OTHER): Payer: Self-pay | Admitting: "Endocrinology

## 2018-05-21 ENCOUNTER — Ambulatory Visit (FREE_STANDING_LABORATORY_FACILITY): Payer: Medicare Other | Admitting: "Endocrinology

## 2018-05-21 VITALS — BP 105/65 | HR 96 | Resp 16 | Ht 60.0 in | Wt 149.8 lb

## 2018-05-21 DIAGNOSIS — Z992 Dependence on renal dialysis: Secondary | ICD-10-CM

## 2018-05-21 DIAGNOSIS — Z7984 Long term (current) use of oral hypoglycemic drugs: Secondary | ICD-10-CM

## 2018-05-21 DIAGNOSIS — Z8673 Personal history of transient ischemic attack (TIA), and cerebral infarction without residual deficits: Secondary | ICD-10-CM

## 2018-05-21 DIAGNOSIS — I120 Hypertensive chronic kidney disease with stage 5 chronic kidney disease or end stage renal disease: Secondary | ICD-10-CM

## 2018-05-21 DIAGNOSIS — E785 Hyperlipidemia, unspecified: Secondary | ICD-10-CM

## 2018-05-21 DIAGNOSIS — N186 End stage renal disease: Secondary | ICD-10-CM

## 2018-05-21 DIAGNOSIS — E1122 Type 2 diabetes mellitus with diabetic chronic kidney disease: Secondary | ICD-10-CM

## 2018-05-21 DIAGNOSIS — E1165 Type 2 diabetes mellitus with hyperglycemia: Secondary | ICD-10-CM

## 2018-05-21 LAB — COMPREHENSIVE METABOLIC PANEL
ALT: 13 U/L (ref 0–55)
AST (SGOT): 17 U/L (ref 5–34)
Albumin/Globulin Ratio: 0.9 (ref 0.9–2.2)
Albumin: 3.5 g/dL (ref 3.5–5.0)
Alkaline Phosphatase: 138 U/L — ABNORMAL HIGH (ref 37–106)
BUN: 40 mg/dL — ABNORMAL HIGH (ref 7.0–19.0)
Bilirubin, Total: 0.3 mg/dL (ref 0.2–1.2)
CO2: 31 mEq/L — ABNORMAL HIGH (ref 21–29)
Calcium: 9.3 mg/dL (ref 8.5–10.5)
Chloride: 88 mEq/L — ABNORMAL LOW (ref 100–111)
Creatinine: 5.4 mg/dL — ABNORMAL HIGH (ref 0.4–1.5)
Globulin: 3.7 g/dL (ref 2.0–3.7)
Glucose: 224 mg/dL — ABNORMAL HIGH (ref 70–100)
Potassium: 4.2 mEq/L (ref 3.5–5.1)
Protein, Total: 7.2 g/dL (ref 6.0–8.3)
Sodium: 135 mEq/L — ABNORMAL LOW (ref 136–145)

## 2018-05-21 LAB — LIPID PANEL
Cholesterol / HDL Ratio: 2.8
Cholesterol: 168 mg/dL (ref 0–199)
HDL: 59 mg/dL (ref 40–9999)
LDL Calculated: 68 mg/dL (ref 0–99)
Triglycerides: 204 mg/dL — ABNORMAL HIGH (ref 34–149)
VLDL Calculated: 41 mg/dL — ABNORMAL HIGH (ref 10–40)

## 2018-05-21 LAB — CBC AND DIFFERENTIAL
Absolute NRBC: 0 10*3/uL (ref 0.00–0.00)
Basophils Absolute Automated: 0.01 10*3/uL (ref 0.00–0.08)
Basophils Automated: 0.2 %
Eosinophils Absolute Automated: 0.28 10*3/uL (ref 0.00–0.44)
Eosinophils Automated: 5 %
Hematocrit: 36.2 % (ref 34.7–43.7)
Hgb: 10.6 g/dL — ABNORMAL LOW (ref 11.4–14.8)
Immature Granulocytes Absolute: 0.01 10*3/uL (ref 0.00–0.07)
Immature Granulocytes: 0.2 %
Lymphocytes Absolute Automated: 1.3 10*3/uL (ref 0.42–3.22)
Lymphocytes Automated: 23.3 %
MCH: 23.2 pg — ABNORMAL LOW (ref 25.1–33.5)
MCHC: 29.3 g/dL — ABNORMAL LOW (ref 31.5–35.8)
MCV: 79.4 fL (ref 78.0–96.0)
MPV: 11 fL (ref 8.9–12.5)
Monocytes Absolute Automated: 0.65 10*3/uL (ref 0.21–0.85)
Monocytes: 11.6 %
Neutrophils Absolute: 3.34 10*3/uL (ref 1.10–6.33)
Neutrophils: 59.7 %
Nucleated RBC: 0 /100 WBC (ref 0.0–0.0)
Platelets: 225 10*3/uL (ref 142–346)
RBC: 4.56 10*6/uL (ref 3.90–5.10)
RDW: 15 % (ref 11–15)
WBC: 5.59 10*3/uL (ref 3.10–9.50)

## 2018-05-21 LAB — HEMOLYSIS INDEX: Hemolysis Index: 1 (ref 0–18)

## 2018-05-21 LAB — GFR: EGFR: 7.9

## 2018-05-21 LAB — HEMOGLOBIN A1C
Average Estimated Glucose: 159.9 mg/dL
Hemoglobin A1C: 7.2 % — ABNORMAL HIGH (ref 4.6–5.9)

## 2018-05-21 LAB — TSH: TSH: 0.64 u[IU]/mL (ref 0.35–4.94)

## 2018-05-21 MED ORDER — GLIPIZIDE 5 MG PO TABS
2.50 mg | ORAL_TABLET | Freq: Two times a day (BID) | ORAL | 1 refills | Status: DC
Start: 2018-05-21 — End: 2018-08-15

## 2018-05-21 NOTE — Progress Notes (Signed)
Subjective:      Date: 05/21/2018 11:33 AM   Patient ID: Rahma Meller is a 67 y.o. female.    Chief Complaint:  Chief Complaint   Patient presents with   . Diabetes     type 2 w/o ins       HPI    Visit type: follow up  Type: Type II and non-insulin requiring  Evaluation: diagnosed years ago  Last follow-up: 3 months ago     Diabetes Medication Regimen: Tradjenta 5 mg daily   Medication effectiveness/adherence: partially effective and general adherence with medications  Medication side effects: none  Presenting symptoms at time of diagnosis: random glucose >200 mg/dL  Prior medications: Metformin d/c'd due to ESRD   Current symptoms: none  Patient denies: no other symptoms including chest pain, shortness of breath, hypoglycemic episodes, polydipsia, polyuria, headaches, dizziness, or abdominal pain  Exercise: walking  Diet: inconsistent compliance with and recommended diet; per patient's daughter, recently patient has been eating more carbs and has not been consistent with monitoring BS  Complications: ESRD on hemodialysis - started hemodialysis October 2018   Pertinent medical history: CAD, hyperlipidemia and stroke, ESRD on HD     Home glucose readings: Download 30 day period average 219; highest BS 445; lowest BS 148; patient recalls eating dessert and BS was in the 400s after eating the dessert       This has increased significantly from her previous visit 3 months ago     A1c result: 5.4 (June 2019)     A1c trend is variable  Microalbumin trend: No results found for: MICROALBUMIN - n/a due to ESRD on HD   Microalbumin trend is n/a  LDL trend:   Lab Results   Component Value Date    LDL 57 11/15/2017    LDL 83 10/16/2016    LDL 122 (H) 08/07/2016     LDL trend is at goal  Additional concerns: In addition, pt has hyperlipidemia and is compliant with lipid therapy.  Pt denies side effects of lipid therapy - specifically denies myalgia. Has HTN, no evidence of CHF and is compliant with medications; hx of stroke  4-5 years ago with no residual neuro deficits; Had quadruple bypass > 1 year ago, currently no anginal symptoms; developed ESRD and started hemodialysis October 2018, dialysis days MWF.     Last eye exam was earlier this year.      Hx of stroke 4 to 5 years ago, no neuro deficit.     Problem List:  Patient Active Problem List   Diagnosis   . HTN (hypertension) urgency   . Hyperlipidemia   . History of CVA (cerebrovascular accident)   . Elevated brain natriuretic peptide (BNP) level   . Hyponatremia   . NSTEMI (non-ST elevated myocardial infarction)   . End stage renal disease   . Acute bronchitis   . Anemia in chronic kidney disease (CODE)   . Iron deficiency anemia, unspecified iron deficiency anemia type   . Chronic renal failure, stage 5   . Diabetes mellitus   . Bronchiectasis       Current Medications:  Current Outpatient Prescriptions   Medication Sig Dispense Refill   . ADVAIR DISKUS 250-50 MCG/DOSE Aerosol Powder, Breath Activtivatede INHALE 1 DOSE BY MOUTH TWICE DAILY  5   . aspirin EC 325 MG EC tablet Take 1 tablet (325 mg total) by mouth daily. 30 tablet 0   . carvedilol (COREG) 3.125 MG tablet Take 3.125 mg by mouth  daily.         . Cholecalciferol (VITAMIN D3) 2000 units Tab Take 4,000 IU by mouth daily.     . ferrous sulfate 324 (65 FE) MG Tablet Delayed Response TAKE 1 TABLET BY MOUTH IN THE MORNING WITH BREAKFAST 90 tablet 1   . fluticasone (FLONASE) 50 MCG/ACT nasal spray USE 2 SPRAY(S) IN EACH NOSTRIL ONCE DAILY 1 Bottle 3   . Linagliptin (TRADJENTA) 5 MG Tab Take 1 tablet (5 mg total) by mouth daily 90 tablet 1   . montelukast (SINGULAIR) 10 MG tablet as needed.     . Olopatadine HCl (PATADAY) 0.2 % Solution Apply 1 drop to eye daily. 2.5 mL 3   . ONE TOUCH ULTRA TEST test strip Check blood sugar 2 times daily 200 each 3   . pantoprazole (PROTONIX) 40 MG tablet TAKE 1 TABLET BY MOUTH IN THE MORNING BEFORE BREAKFAST 90 tablet 1   . RENVELA 800 MG tablet 1,600 mg 3 (three) times daily with  meals.         . simvastatin (ZOCOR) 20 MG tablet Take 20 mg by mouth nightly.         [name] Umeclidinium Bromide (INCRUSE ELLIPTA IN) Inhale into the lungs daily           No current facility-administered medications for this visit.        Allergies:  Allergies   Allergen Reactions   . Mosquito (Culex Pipiens) Allergy Skin Test Shortness Of Breath     And  Trouble  breathing   . Shellfish Allergy Hives   . Shellfish-Derived Products Hives     New  allergy  New  allergy   . Lisinopril Hives and Rash     Patient cannot remember the exact nature of the allergy   . Penicillins Rash and Swelling     numbness  Swelling,  numbness       Past Medical History:  Past Medical History:   Diagnosis Date   . Anemia 10/02/2016    H/O ANEMIA - REFLECTED ON CURRENT LABS.   [name] Cataracts, bilateral     H/O CATARACTS - SURGERY DONE - STILL HAS SOME ISSUES.   . Cerebrovascular accident 2014    H/O STROKE - WEAKNESS ON LEFT SIDE. NO NEUROLOGIST. USES CANE FOR BALANCE.   [name] Chronic renal failure, stage 5 05/08/2017   . Constipation    . Coronary artery disease 06/2016    H/O QUADRUPLE BYPASS SURGERY - FOLLOWED BY [facility].   . Diabetes mellitus    . Difficulty walking     WALKS WITH CANE. WILL USE WC IN HOSPITAL   . End-stage renal disease 04/2017    DIALYSIS [facility] [place] MON-WED-FRI. HAS PERMACATH RIGHT CHEST   . Gastroesophageal reflux disease     MED EFFECTIVE    . History of MI (myocardial infarction)    . 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 Hypercholesteremia     CONTROLLED WITH MEDS.   [name] Hypertension     CONTROLLED WITH MEDS.   [name] Myocardial infarction 06/2016   . Pre-diabetes    . Renal insufficiency     CHRONIC KIDNEY DISEASE - FOLLOWED BY DR. Kate Sable.   [name] Shortness of breath     MODERATE EXERTION    . Type 2 diabetes mellitus, controlled     AM FSBS 150-200. INSTRUCTED TO CONTROL DIET CLOSELY TONIGHT.  HAD BEEN OFF JANUVIA, RESTARTED 06/15/17. HA1C 9 06/15/17.  MANAGED BY PCP.        Past Surgical History:  Past Surgical History:    Procedure Laterality Date   . BRONCHOSCOPY, FIBEROPTIC N/A 06/21/2017    Procedure: BRONCHOSCOPY with brushing and wash;  Surgeon: Danae Chen, MD;  Location: [facility];  Service: Pulmonary;  Laterality: N/A;   . CORONARY ARTERY BYPASS N/A 06/12/2016    Procedure: Coronary Artery Bypass x 4.;  Surgeon: Bjorn Loser, MD;  Location[facility];  Service: Cardiothoracic;  Laterality: N/A;  LIMA to LDA  Vein to PDA  Vein to OM  Vein to Diagonal   . EGD, COLONOSCOPY N/A 10/04/2016   . EGD, COLONOSCOPY N/A 10/04/2016    Procedure: EGD w/ bx, COLONOSCOPY w/ bx, polypectomy;  Surgeon: Norton Blizzard, MD;  Location: [facility];  Service: Gastroenterology;  Laterality: N/A;   . ENDOSCOPIC,VEIN HARVEST N/A 06/12/2016    Procedure: Endoscopic, Left Leg Greater Saphenous Vein Harvest;  Surgeon: Bjorn Loser, MD;  Location: [facility] OR;  Service: Cardiothoracic;  Laterality: N/A;  Left Leg Incision (medially, groin to ankle) @ 0742  Vein out of leg @ 0826  Vein prep complete @ 0845  Total time = 63 minutes   . FORMATION, UPPER EXTREMITY, A-V FISTULA Left 05/31/2017    Procedure: FORMATION, UPPER EXTREMITY, A-V FISTULA;  Surgeon: Cherene Altes, MD;  Location: [facility] OR;  Service: Vascular;  Laterality: Left;  LEFT ARM A-V FISTULA     . REVISION, A-V FISTULA UPPER EXTREMITY Left 07/16/2017    Procedure: REVISION, A-V FISTULA UPPER EXTREMITY WITH PATCH ANGIOPLASTY;  Surgeon: Cherene Altes, MD;  Location: [facility] OR;  Service: Vascular;  Laterality: Left;       Family History:  Family History   Problem Relation Age of Onset   . No known problems Mother    . No known problems Father        Social History:  Social History     Social History   . Marital status: Widowed     Spouse name: N/A   . Number of children: N/A   . Years of education: N/A     Occupational History   . Not on file.     Social History Main Topics   . Smoking status: Never Smoker   . Smokeless tobacco:  Never Used   . Alcohol use No   . Drug use: No   . Sexual activity: No     Other Topics Concern   . Not on file     Social History Narrative   . No narrative on file       The following sections were reviewed this encounter by the provider:   Tobacco  Allergies  Meds  Med Hx  Surg Hx  Fam Hx  Soc Hx        Vitals:  BP 105/65 (BP Site: Left arm, Patient Position: Sitting, Cuff Size: Medium)   Pulse 96   Resp 16   Ht 1.524 m (5')   Wt 67.9 kg (149 lb 12.8 oz)   BMI 29.26 kg/m      ROS: A complete 12 point ROS was obtained. Pertinent positives and negatives as noted in HPI. All other systems are negative.    Physical Examination     General appearance: alert, appears stated age, cooperative and no distress  Eyes: conjunctivae/corneas clear. PERRL, EOM's intact.   Neck: no cervical lymphadenopathy, no carotid bruit,  supple, symmetrical, trachea midline   Thyroid:normal sized on inspection; non tender to palpation and no discrete nodules palpated  Lungs: clear to auscultation bilaterally  Heart: regular rate and rhythm, S1, S2 normal, no murmur, click, rub or gallop  Abdomen: soft, non-tender; bowel sounds normal; no masses,  no organomegaly  Extremities: extremities normal, atraumatic, no cyanosis or edema  Skin: Skin color, texture, turgor normal. No rashes or lesions  Neurologic: Alert and oriented X 3, normal strength and tone. Normal symmetric reflexes. No tremor    Assessment and Plan     1. Uncontrolled type 2 diabetes mellitus with hyperglycemia  -   Lab Results   Component Value Date    HGBA1C 5.4 02/19/2018    HGBA1C 6.3 (H) 11/15/2017    HGBA1C 8.4 (H) 07/24/2017       Current A1C is at goal (goal A1C <6.5%).   BS have been elevated in the past 3 months due to non compliance with carb controlled diet.  BS as high as 400s after eating dessert.   Recommend adding the following DM medication class to the current regimen - Sulfonylurea. Side effects of Sulfonylurea (hypoglycemia, nausea, dizziness,  headache, weight gain) discussed with patient. Recommend home glucometer testing twice a day (pre-breakfast or 2 hrs post-prandial).  Recommend therapeutic lifestyle changes which include optimizing low carbohydrate diet and aerobic exercise efforts.     - start Glipizide 5 mg. 1/2 tablet before breakfast, will continue to monitor BS and increase if needed      - Hemoglobin A1c  - Comprehensive metabolic panel  - TSH  - Lipid panel  - CBC and differential    2. Hyperlipidemia  -  LDL at goal < 100    - on statin and well tolerated    3. Hypertension  - well controlled     - on Amlodipine 10 mg daily, Carvedilol 25 mg BID, Clonidine 0.1 mg daily, Hydralazine 100 mg TID    4. Stroke   - no residual neuro deficits  - on Aspirin and statin for secondary stroke prevention    5. ESRD on HD  -   - tolerating HD without complications  - dialysis days MWF     RTC in 3 months      Ernesto Rutherford MSN FNP-BC  [facility]

## 2018-05-21 NOTE — Patient Instructions (Signed)
Start Glipizide 5 mg. 1/2 tablet by mouth once daily 15 minutes before breakfast.     Goals of your blood sugars are:  Fasting - 70 to 130;  2 hours after a meal 180 or less          Glipizide Extended-release tablets  Brand Name: Glucotrol XL  What is this medicine?  GLIPIZIDE (GLIP i zide) helps to treat type 2 diabetes. It is combined with diet and exercise. The medicine helps your body to use insulin better.  How should I use this medicine?  Take this medicine by mouth. Follow the directions on the prescription label. Swallow the tablets with a drink of water and take with your breakfast. Take your medicine at the same time each day. Do not take more often than directed.  Talk to your pediatrician regarding the use of this medicine in children. Special care may be needed.  Elderly patients over 73 years old may have a stronger reaction and need a smaller dose.  What side effects may I notice from receiving this medicine?  Side effects that you should report to your doctor or health care professional as soon as possible:   allergic reactions like skin rash, itching or hives, swelling of the face, lips, or tongue   breathing problems   dark urine   fever, chills, sore throat   signs and symptoms of low blood sugar such as feeling anxious, confusion, dizziness, increased hunger, unusually weak or tired, sweating, shakiness, cold, irritable, headache, blurred vision, fast heartbeat, loss of consciousness   unusual bleeding or bruising   yellowing of the eyes or skin  Side effects that usually do not require medical attention (report to your doctor or health care professional if they continue or are bothersome):   diarrhea   dizziness   headache   heartburn   nausea   stomach gas  What may interact with this medicine?   bosentan   chloramphenicol   cisapride   medicines for fungal or yeast infections   metoclopramide   probenecid   warfarin  Many medications may cause an increase or decrease in  blood sugar, these include:   alcohol containing beverages   aspirin and aspirin-like drugs   chloramphenicol   chromium   clarithromycin   female hormones, like estrogens or progestins and birth control pills   heart medicines   isoniazid   female hormones or anabolic steroids   medicines for weight loss   medicines for allergies, asthma, cold, or cough   medicines for mental problems   medicines called MAO Inhibitors like Nardil, Parnate, Marplan, Eldepryl   niacin   NSAIDs, medicines for pain and inflammation, like ibuprofen or naproxen   pentamidine   phenytoin   probenecid   quinolone antibiotics like ciprofloxacin, levofloxacin, ofloxacin   some herbal dietary supplements   steroid medicines like prednisone or cortisone   thyroid medicine   water pills or diuretics  What if I miss a dose?  If you miss a dose, take it as soon as you can. If it is almost time for your next dose, take only that dose. Do not take double or extra doses.  Where should I keep my medicine?  Keep out of the reach of children.  Store at room temperature between 15 to 30 degrees C (59 to 86 degrees F). Protect from moisture and humidity. Throw away any unused medicine after the expiration date.  What should I tell my health care provider before I  take this medicine?  They need to know if you have any of these conditions:   diabetic ketoacidosis   glucose-6-phosphate dehydrogenase deficiency   heart disease   kidney disease   liver disease   porphyria   severe infection or injury   thyroid disease   an unusual or allergic reaction to glipizide, sulfa drugs, other medicines, foods, dyes, or preservatives   pregnant or trying to get pregnant   breast-feeding  What should I watch for while using this medicine?  Visit your doctor or health care professional for regular checks on your progress.  A test called the HbA1C (A1C) will be monitored. This is a simple blood test. It measures your blood sugar control  over the last 2 to 3 months. You will receive this test every 3 to 6 months.  Learn how to check your blood sugar. Learn the symptoms of low and high blood sugar and how to manage them.  Always carry a quick-source of sugar with you in case you have symptoms of low blood sugar. Examples include hard sugar candy or glucose tablets. Make sure others know that you can choke if you eat or drink when you develop serious symptoms of low blood sugar, such as seizures or unconsciousness. They must get medical help at once.  Tell your doctor or health care professional if you have high blood sugar. You might need to change the dose of your medicine. If you are sick or exercising more than usual, you might need to change the dose of your medicine.  Do not skip meals. Ask your doctor or health care professional if you should avoid alcohol. Many nonprescription cough and cold products contain sugar or alcohol. These can affect blood sugar.  This medicine can make you more sensitive to the sun. Keep out of the sun. If you cannot avoid being in the sun, wear protective clothing and use sunscreen. Do not use sun lamps or tanning beds/booths.  Wear a medical ID bracelet or chain, and carry a card that describes your disease and details of your medicine and dosage times.  NOTE:This sheet is a summary. It may not cover all possible information. If you have questions about this medicine, talk to your doctor, pharmacist, or health care provider. Copyright 2019 [employer]

## 2018-06-06 ENCOUNTER — Other Ambulatory Visit (INDEPENDENT_AMBULATORY_CARE_PROVIDER_SITE_OTHER): Payer: Self-pay | Admitting: Internal Medicine

## 2018-06-06 DIAGNOSIS — R6889 Other general symptoms and signs: Secondary | ICD-10-CM

## 2018-06-13 ENCOUNTER — Encounter (INDEPENDENT_AMBULATORY_CARE_PROVIDER_SITE_OTHER): Payer: Self-pay

## 2018-06-13 ENCOUNTER — Other Ambulatory Visit (INDEPENDENT_AMBULATORY_CARE_PROVIDER_SITE_OTHER): Payer: Self-pay | Admitting: Internal Medicine

## 2018-06-13 NOTE — Progress Notes (Signed)
:  Room        VASCULAR SURGERY PATIENT FORM       Appt Date/Time: 06/18/18 11:10a   PCP: John Giovanni, MD  Referring Physician:    New Patient Follow-Up (X) Post-op   [place] Today (X) [place] - [facility] Other Imaging     Chief Complaint/HPI: 4 week f/u to reported increased bleeding of L AVF, Pt is s/p L AVF Revision on 07/16/17    Date of Last Visit: Last OV 05/14/18   Allergies   Allergen Reactions   . Mosquito (Culex Pipiens) Allergy Skin Test Shortness Of Breath     And  Trouble  breathing   . Shellfish Allergy Hives   . Shellfish-Derived Products Hives     New  allergy  New  allergy   . Lisinopril Hives and Rash     Patient cannot remember the exact nature of the allergy   . Penicillins Rash and Swelling     numbness  Swelling,  numbness        Selected Medications:    Coumadin Plavix Eliquis Xarelto Aspirin (X) Fish Oil Lovenox   Insulin Metformin Other Diabetes (X) Atorvastatin  Other Cholesterol (X)    MHx:  Stroke/TIA/Seizures (X) Thyroid  Diabetes (X)   Myocardial Infarction (X) Arrhythmia  COPD/Asthma (other lung)   Kidney Disease (X) Liver Disease DVT   Wounds Musculoskeletal (spine) Cancer             Smoker           Former Smoker     (X)     Never Smoker    Vital Signs this Visit Pulses  HT WT HR   Rad Ulnar Brach Fem Pop DP PT       L          BP L BP R SpO2  R                        Imaging results:           Plan of Care:

## 2018-06-18 ENCOUNTER — Ambulatory Visit (HOSPITAL_BASED_OUTPATIENT_CLINIC_OR_DEPARTMENT_OTHER): Payer: Medicare Other | Admitting: Specialist

## 2018-06-18 ENCOUNTER — Other Ambulatory Visit (HOSPITAL_BASED_OUTPATIENT_CLINIC_OR_DEPARTMENT_OTHER): Payer: Medicare Other

## 2018-06-20 ENCOUNTER — Other Ambulatory Visit: Payer: Self-pay

## 2018-06-25 ENCOUNTER — Encounter (INDEPENDENT_AMBULATORY_CARE_PROVIDER_SITE_OTHER): Payer: Self-pay

## 2018-07-16 ENCOUNTER — Other Ambulatory Visit (INDEPENDENT_AMBULATORY_CARE_PROVIDER_SITE_OTHER): Payer: Medicare Other

## 2018-07-16 ENCOUNTER — Other Ambulatory Visit (HOSPITAL_BASED_OUTPATIENT_CLINIC_OR_DEPARTMENT_OTHER): Payer: Medicare Other

## 2018-07-16 DIAGNOSIS — Z992 Dependence on renal dialysis: Secondary | ICD-10-CM

## 2018-07-16 DIAGNOSIS — N186 End stage renal disease: Secondary | ICD-10-CM

## 2018-07-17 ENCOUNTER — Encounter (INDEPENDENT_AMBULATORY_CARE_PROVIDER_SITE_OTHER): Payer: Self-pay

## 2018-07-24 ENCOUNTER — Encounter (INDEPENDENT_AMBULATORY_CARE_PROVIDER_SITE_OTHER): Payer: Self-pay

## 2018-07-25 ENCOUNTER — Ambulatory Visit (INDEPENDENT_AMBULATORY_CARE_PROVIDER_SITE_OTHER): Payer: Medicare Other | Admitting: Internal Medicine

## 2018-07-25 ENCOUNTER — Encounter (INDEPENDENT_AMBULATORY_CARE_PROVIDER_SITE_OTHER): Payer: Self-pay | Admitting: Internal Medicine

## 2018-07-25 VITALS — BP 117/79 | HR 90 | Temp 98.1°F | Ht 58.5 in | Wt 150.0 lb

## 2018-07-25 DIAGNOSIS — Z0000 Encounter for general adult medical examination without abnormal findings: Secondary | ICD-10-CM

## 2018-07-25 DIAGNOSIS — Z13820 Encounter for screening for osteoporosis: Secondary | ICD-10-CM

## 2018-07-25 DIAGNOSIS — Z1239 Encounter for other screening for malignant neoplasm of breast: Secondary | ICD-10-CM

## 2018-07-25 NOTE — Progress Notes (Signed)
Jacqueline Weber is a 67 y.o. female who presents today for a Medicare Annual Wellness Visit.     Health Risk Assessment     During the past month, how would you rate your general health?:  Poor  Which of the following tasks can you do without assistance - drive or take the bus alone; shop for groceries or clothes; prepare your own meals; do your own housework/laundry; handle your own finances/pay bills; eat, bathe or get around your home?:  None of these  Which of the following problems have you been bothered by in the past month - dizzy when standing up; problems using the phone; feeling tired or fatigued; moderate or severe body pain?: None of these  Do you exercise for about 20 minutes 3 or more days per week?:  No  During the past month was someone available to help if you needed and wanted help?  For example, if you felt nervous, lonely, got sick and had to stay in bed, needed someone to talk to, needed help with daily chores or needed help just taking care of yourself.: Yes  Do you always wear a seat belt?: Yes  Do you have any trouble taking medications the way you have been told to take them?: No  Have you been given any information that can help you with keeping track of your medications?: Yes  Do you have trouble paying for your medications?: No  Have you been given any information that can help you with hazards in your house, such as scatter rugs, furniture, etc?: No  Do you feel unsteady when standing or walking?: No  Do you worry about falling?: Yes  Have you fallen two or more times in the past year?: No  Did you suffer any injuries from your falls in the past year?: No    Additional Concerns    Patient Care Team:  John Giovanni, MD as PCP - General (Internal Medicine)  Karl Bales as Technician    Past Medical History:   Diagnosis Date   . Anemia 10/02/2016    H/O ANEMIA - REFLECTED ON CURRENT LABS.   [name] Cataracts, bilateral     H/O CATARACTS - SURGERY DONE - STILL HAS SOME ISSUES.   .  Cerebrovascular accident 2014    H/O STROKE - WEAKNESS ON LEFT SIDE. NO NEUROLOGIST. USES CANE FOR BALANCE.   [name] Chronic renal failure, stage 5 05/08/2017   . Constipation    . Coronary artery disease 06/2016    H/O QUADRUPLE BYPASS SURGERY - FOLLOWED BY [facility].   . Diabetes mellitus    . Difficulty walking     WALKS WITH CANE. WILL USE WC IN HOSPITAL   . End-stage renal disease 04/2017    DIALYSIS DAVITA  MON-WED-FRI. HAS PERMACATH RIGHT CHEST   . Gastroesophageal reflux disease     MED EFFECTIVE    . History of MI (myocardial infarction)    . 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 Hypercholesteremia     CONTROLLED WITH MEDS.   [name] Hypertension     CONTROLLED WITH MEDS.   [name] Myocardial infarction 06/2016   . Pre-diabetes    . Renal insufficiency     CHRONIC KIDNEY DISEASE - FOLLOWED BY DR. Kate Sable.   [name] Shortness of breath     MODERATE EXERTION    . Type 2 diabetes mellitus, controlled     AM FSBS 150-200. INSTRUCTED TO CONTROL DIET CLOSELY TONIGHT.  HAD BEEN OFF JANUVIA, RESTARTED 06/15/17. HA1C 9 06/15/17. MANAGED BY PCP.      Past Surgical History:   Procedure Laterality Date   . BRONCHOSCOPY, FIBEROPTIC N/A 06/21/2017    Procedure: BRONCHOSCOPY with brushing and wash;  Surgeon: Danae Chen, MD;  Location: [facility] OR;  Service: Pulmonary;  Laterality: N/A;   . CORONARY ARTERY BYPASS N/A 06/12/2016    Procedure: Coronary Artery Bypass x 4.;  Surgeon: Bjorn Loser, MD;  Location: [facility] OR;  Service: Cardiothoracic;  Laterality: N/A;  LIMA to LDA  Vein to PDA  Vein to OM  Vein to Diagonal   . EGD, COLONOSCOPY N/A 10/04/2016   . EGD, COLONOSCOPY N/A 10/04/2016    Procedure: EGD w/ bx, COLONOSCOPY w/ bx, polypectomy;  Surgeon: Norton Blizzard, MD;  Location: [facility] OR;  Service: Gastroenterology;  Laterality: N/A;   . ENDOSCOPIC,VEIN HARVEST N/A 06/12/2016    Procedure: Endoscopic, Left Leg Greater Saphenous Vein Harvest;  Surgeon: Bjorn Loser, MD;  [facility];   Service: Cardiothoracic;  Laterality: N/A;  Left Leg Incision (medially, groin to ankle) @ 0742  Vein out of leg @ 0826  Vein prep complete @ 0845  Total time = 63 minutes   . FORMATION, UPPER EXTREMITY, A-V FISTULA Left 05/31/2017    Procedure: FORMATION, UPPER EXTREMITY, A-V FISTULA;  Surgeon: Cherene Altes, MD;  Location: [place] MAIN OR;  Service: Vascular;  Laterality: Left;  LEFT ARM A-V FISTULA     . REVISION, A-V FISTULA UPPER EXTREMITY Left 07/16/2017    Procedure: REVISION, A-V FISTULA UPPER EXTREMITY WITH PATCH ANGIOPLASTY;  Surgeon: Cherene Altes, MD;  Location:  MAIN OR;  Service: Vascular;  Laterality: Left;     Allergies   Allergen Reactions   . Mosquito (Culex Pipiens) Allergy Skin Test Shortness Of Breath     And  Trouble  breathing   . Shellfish Allergy Hives   . Shellfish-Derived Products Hives     New  allergy  New  allergy   . Lisinopril Hives and Rash     Patient cannot remember the exact nature of the allergy   . Penicillins Rash and Swelling     numbness  Swelling,  numbness      Current Outpatient Medications   Medication Sig Dispense Refill   . ADVAIR DISKUS 250-50 MCG/DOSE Aerosol Powder, Breath Activtivatede INHALE 1 DOSE BY MOUTH TWICE DAILY  5   . aspirin EC 325 MG EC tablet Take 1 tablet (325 mg total) by mouth daily. 30 tablet 0   . carvedilol (COREG) 3.125 MG tablet Take 3.125 mg by mouth daily.         . Cholecalciferol (VITAMIN D3) 2000 units Tab Take 4,000 IU by mouth daily.     . ferrous sulfate 324 (65 FE) MG Tablet Delayed Response TAKE 1 TABLET BY MOUTH IN THE MORNING WITH BREAKFAST 90 tablet 1   . fluticasone (FLONASE) 50 MCG/ACT nasal spray USE 2 SPRAY(S) IN EACH NOSTRIL ONCE DAILY 1 Bottle 3   . glipiZIDE (GLUCOTROL) 5 MG tablet Take 0.5 tablets (2.5 mg total) by mouth 2 (two) times daily before meals 45 tablet 1   . levoFLOXacin (LEVAQUIN) 500 MG tablet Take 500 mg by mouth daily     . Linagliptin (TRADJENTA) 5 MG Tab Take 1 tablet (5 mg total) by  mouth daily 90 tablet 1   . montelukast (SINGULAIR) 10 MG tablet as needed.     [name]  Olopatadine HCl 0.2 % Solution INSTILL 1 DROP INTO AFFECTED EYE ONCE DAILY 3 Bottle 3   . ONE TOUCH ULTRA TEST test strip Check blood sugar 2 times daily 200 each 3   . pantoprazole (PROTONIX) 40 MG tablet TAKE 1 TABLET BY MOUTH IN THE MORNING BEFORE BREAKFAST 90 tablet 1   . predniSONE 5 MG (21) Tablet Therapy Pack Take by mouth     . RENVELA 800 MG tablet 1,600 mg 3 (three) times daily with meals.         . simvastatin (ZOCOR) 20 MG tablet Take 20 mg by mouth nightly.         [name] Umeclidinium Bromide (INCRUSE ELLIPTA IN) Inhale into the lungs daily           No current facility-administered medications for this visit.       Social History     Tobacco Use   . Smoking status: Never Smoker   . Smokeless tobacco: Never Used   Substance Use Topics   . Alcohol use: No   . Drug use: No      Family History   Problem Relation Age of Onset   . No known problems Mother    . No known problems Father         The following sections were reviewed this encounter by the provider:   Tobacco  Allergies  Meds  Problems  Med Hx  Surg Hx  Fam Hx         Hospitalizations  no hospitalizations within past 6 months    Depression Screening    See related Activity or Flowsheet    Functional Ability    Falls Risk:  home has throw rugs, poor lighting, or slippery bath tub/shower  Hearing:  hearing within normal limits  Exercise:  no exercise  ADL's:   Bathing - independent   Dressing - minimal assistance   Mobility - minimal assistance   Transfer - independent   Eating - independent}   Toileting - independent   ADL assistance provided by daughter    Discussion of Advance Directives: Has no Advanced Directive. Form provided.     Assessment    BP 117/79 (BP Site: Right arm, Patient Position: Sitting, Cuff Size: Medium)   Pulse 90   Temp 98.1 F (36.7 C) (Oral)   Ht 1.486 m (4' 10.5")   Wt 68 kg (150 lb)   SpO2 97%   BMI 30.82 kg/m      Vision Screening  (required for IPPE only): Patient states eye exam performed elsewhere within past 12 months          Evaluation of Cognitive Function    Mood/affect: Appropriate  Appearance: neatly groomed, appropriately and adequately nourished  Family member/caregiver input: No concerns        AWV Mini-Cog Result:  Pt does not speak English    Doing well except for cough  Pulmonologist has started pt on levaquin and medrol dose pak  Pt will f/u with pulm in 2 weeks    Pt will be moving to [place] in a couple of months    11 point ROS negative except as above    General Examination:   GENERAL APPEARANCE: alert, in no acute distress, well developed, well nourished, oriented to time, place, and person.   HEAD: normal appearance, atraumatic.   EYES: extraocular movement intact (EOMI), pupils equal, round, reactive to light and accommodation, sclera anicteric, conjunctiva clear.   EARS: tympanic membranes normal bilaterally,  external canals normal .   NOSE: normal nasal mucosa, no lesions.   ORAL CAVITY: normal oropharynx, normal lips, mucosa moist, no lesions.   THROAT: normal appearance, clear, no erythema.   NECK/THYROID: neck supple,  no cervical lymphadenopathy, no neck mass palpated, no thyromegaly.   LYMPH NODES: no palpable adenopathy.   SKIN: good turgor, no rashes, no suspicious lesions.   HEART: S1, S2 normal, no murmurs, rubs, gallops, regular rate and rhythm.   LUNGS: normal effort / no distress, rales left side  ABDOMEN: bowel sounds present, no hepatosplenomegaly, soft, nontender, nondistended.   MUSCULOSKELETAL: full range of motion, no swelling or deformity.   EXTREMITIES: no edema, no clubbing, cyanosis, or edema.   PERIPHERAL PULSES: 2+ dorsalis pedis, 2+ posterior tibial.   NEUROLOGIC: nonfocal, cranial nerves 2-12 grossly intact, deep tendon reflexes 2+ symmetrical, normal strength and tone, sensory exam intact. Foot Exam: monofilament testing of plantar aspect of first toe and metatarsal joints (10g monofilament)  intact bilaterally  PSYCH: cognitive function intact, mood/affect full range, speech clear.    1. Routine general medical examination at a health care facility    2. Breast cancer screening  - Mammo Digital Screening Bilateral W Cad; Future    3. Osteoporosis screening  - Dxa Bone Density Axial Skeleton; Future    Advised pt to f/u with an academic center in [place] given history of multiple medical morbidities.  John Giovanni, MD

## 2018-07-25 NOTE — Patient Instructions (Signed)
Women's Preventive Wellness Plan  Today's Date: July 25, 2018    Patient Jacqueline Weber    Date of Birth: 07/14/1951     As part of your wellness benefit, Medicare makes many screening tests available to you at no charge.  A complete list of these tests can be found at their website, [email or web address]. However, many of these tests or recommendations are out of date, or may not apply to you. After careful consideration of your own personal health needs, the following testing is recommended for you:      Preventive Service    Up-to-date (UTD)/Due/Not Applicable (N/A)   Last Done   Medicare Frequency   Body Mass Index   Up-to-date July 25, 2018  (BMI):Body mass index is 30.82 kg/m.   Height:Height: 148.6 cm (4' 10.5")  Weight:Weight: 68 kg (150 lb)  Annually   Blood Pressure: Up-to-date July 25, 2018    BP: 117/79    Every 2 yrs, if BP </= 120/80 mm hg   Annually, if BP >120-139/80-89 mm hg   Cholesterol Testing Up-to-date Lab Results   Component Value Date    LDL 68 05/21/2018      Regularly beginning at age 69 with risk factors   Diabetes Screening Up-to-date Lab Results   Component Value Date    GLU 224 (H) 05/21/2018       If prediabetes, one screening every 6 months   Otherwise, one screening every 12 months with certain risk factors for diabetes   Osteoporosis Screening   (Bone Density Measurement)  Due   Routinely, for women aged 65+   Routinely, for women aged 60-64 with risk factors   Breast Cancer Screening   (Mammogram) Due   Every 2 yrs, aged 64-74 yrs   Cervical Cancer Screening   (Pap Smear)  Not medically indicated   Annually if at high risk for developing cervical or vaginal cancer or childbearing age with abnormal Pap test within past 3 years; Every 2 years for women at normal risk   HPV: Once every 5 years; All asymptomatic female Medicare beneficiaries aged 13 to 73 years   Colorectal Cancer Screening  Up-to-date   Annually, Fecal Occult Blood Stool (FOBS)   Every 5 yrs, Sigmoidoscopy with FOBS   Every 10 yrs, Colonoscopy   Every 3 yrs, Cologuard   Depression Screening Up-to-date July 25, 2018   As necessary for those with risk factors   Sexually Transmitted Diseases (STDs) & HIV Screening Not medically indicated   As necessary for those with risk factors   Alcohol Misuse Screening Up-to-date   As necessary for those with risk factors   Immunizations:   Up-to-date Immunization History   Administered Date(s) Administered   . INFLUENZA HIGH DOSE 10YRS+ 06/15/2017   . Influenza quadrivalent (IM) PF 3 Yrs & greater 05/25/2016   . Pneumococcal 23 valent 03/12/2013   . Pneumococcal Conjugate 13-Valent 10/16/2017   . Zoster (ZOSTAVAX) Vaccine 05/25/2016     Prevnar 13: 1 dose after age 32   Pneumovax 54: 1 dose 1 year after Prevnar   Influenza: Annually   Advance Directive 5 wishes form given to patient   Once; update as needed   Medical Nutrition Therapy Not applicable   As necessary for diabetes or renal disease   Smoking Cessation Counseling Not applicable Counseling given: Not Answered    Frequency: two cessation attempts per year.   Glaucoma Screening Up-to-date  . Annually for covered high risk Medicare beneficiaries (one of the following:  DM, FHx Glaucoma, African-Americans aged 36+, 109 aged 65+)   Hepatitis C Virus (HCV) Screening Up-to-date   Annually only for high risk behavior   Once if born between [place] and are not considered high risk   Lung Cancer Screening Not applicable   Annually if asymptomatic, tobacco smoking history of at least 30 pack-years (one pack-year = smoking one pack per day for one year; 1 pack = 20 cigarettes), and current smoker or one who has quit smoking within the last 15 years     Your major risk factors:       Diabetes and Hypertension     Recommendations for improvement:    Low cholesterol diet, Exercise and Low carb diet     Referrals:     See After Visit Summary orders

## 2018-07-26 ENCOUNTER — Encounter (INDEPENDENT_AMBULATORY_CARE_PROVIDER_SITE_OTHER): Payer: Self-pay

## 2018-07-26 NOTE — Progress Notes (Addendum)
:  Room       VASCULAR SURGERY PATIENT FORM       Appt Date/Time: 07/30/18 @ 10:10am   PCP: John Giovanni, MD  Referring Physician:    New Patient Follow-Up Post-op   [place] Today [place] - [facility] Other Imaging     Chief Complaint/HPI:  21m f/u for CRF #5, LUE A-V Fistula. Discuss U/S results.  Date of Last Visit: 05/14/18    Surgeries/Procedures:  LUE A-V FISTULA REVISION W/ PATCH ANGIOPLASTY on 07/16/17 Dr. Lutricia Feil  LUE A-V FISTULA FORMATION on 05/31/17 Dr. Lutricia Feil    Allergies   Allergen Reactions   . Mosquito (Culex Pipiens) Allergy Skin Test Shortness Of Breath     And  Trouble  breathing   . Shellfish Allergy Hives   . Shellfish-Derived Products Hives     New  allergy  New  allergy   . Lisinopril Hives and Rash     Patient cannot remember the exact nature of the allergy   . Penicillins Rash and Swelling     numbness  Swelling,  numbness        Selected Medications:    Coumadin Plavix Eliquis Xarelto Aspirin   X Fish Oil Lovenox   Insulin Metformin Other Diabetes   X Atorvastatin  Other Cholesterol    X   MHx:  Stroke/TIA/Seizures   X Thyroid  Diabetes   X   Myocardial Infarction   X Arrhythmia  COPD/Asthma (other lung)   X   Kidney Disease   X Liver Disease DVT   Wounds Musculoskeletal (spine) Cancer             Smoker           Former Smoker     x    Never Smoker    Vital Signs this Visit Pulses  HT WT HR   Rad Ulnar Brach Fem Pop DP PT       L          BP L BP R SpO2  R                        Imaging results:           Plan of Care:

## 2018-07-30 ENCOUNTER — Ambulatory Visit (HOSPITAL_BASED_OUTPATIENT_CLINIC_OR_DEPARTMENT_OTHER): Payer: Medicare Other | Admitting: Specialist

## 2018-08-12 ENCOUNTER — Encounter (INDEPENDENT_AMBULATORY_CARE_PROVIDER_SITE_OTHER): Payer: Self-pay

## 2018-08-14 ENCOUNTER — Other Ambulatory Visit (INDEPENDENT_AMBULATORY_CARE_PROVIDER_SITE_OTHER): Payer: Self-pay | Admitting: Internal Medicine

## 2018-08-15 ENCOUNTER — Other Ambulatory Visit (INDEPENDENT_AMBULATORY_CARE_PROVIDER_SITE_OTHER): Payer: Self-pay

## 2018-08-15 MED ORDER — GLIPIZIDE 5 MG PO TABS
2.5000 mg | ORAL_TABLET | Freq: Two times a day (BID) | ORAL | 1 refills | Status: DC
Start: 2018-08-15 — End: 2018-08-26

## 2018-08-15 NOTE — Telephone Encounter (Signed)
LV 05/21/18  Upcoming visit 08/22/18

## 2018-08-21 ENCOUNTER — Encounter (INDEPENDENT_AMBULATORY_CARE_PROVIDER_SITE_OTHER): Payer: Self-pay

## 2018-08-22 ENCOUNTER — Ambulatory Visit (INDEPENDENT_AMBULATORY_CARE_PROVIDER_SITE_OTHER): Payer: Medicare Other | Admitting: "Endocrinology

## 2018-08-26 ENCOUNTER — Encounter (INDEPENDENT_AMBULATORY_CARE_PROVIDER_SITE_OTHER): Payer: Self-pay | Admitting: "Endocrinology

## 2018-08-26 ENCOUNTER — Ambulatory Visit (INDEPENDENT_AMBULATORY_CARE_PROVIDER_SITE_OTHER): Payer: Medicare Other | Admitting: "Endocrinology

## 2018-08-26 VITALS — BP 163/75 | HR 93 | Resp 16 | Ht 58.5 in | Wt 149.0 lb

## 2018-08-26 DIAGNOSIS — E1165 Type 2 diabetes mellitus with hyperglycemia: Secondary | ICD-10-CM

## 2018-08-26 LAB — COMPREHENSIVE METABOLIC PANEL
ALT: 11 U/L (ref 0–55)
AST (SGOT): 12 U/L (ref 5–34)
Albumin/Globulin Ratio: 1.1 (ref 0.9–2.2)
Albumin: 3.7 g/dL (ref 3.5–5.0)
Alkaline Phosphatase: 139 U/L — ABNORMAL HIGH (ref 37–106)
BUN: 36 mg/dL — ABNORMAL HIGH (ref 7.0–19.0)
Bilirubin, Total: 0.5 mg/dL (ref 0.2–1.2)
CO2: 31 mEq/L — ABNORMAL HIGH (ref 21–29)
Calcium: 9.5 mg/dL (ref 8.5–10.5)
Chloride: 97 mEq/L — ABNORMAL LOW (ref 100–111)
Creatinine: 5.2 mg/dL — ABNORMAL HIGH (ref 0.4–1.5)
Globulin: 3.3 g/dL (ref 2.0–3.7)
Glucose: 132 mg/dL — ABNORMAL HIGH (ref 70–100)
Potassium: 5 mEq/L (ref 3.5–5.1)
Protein, Total: 7 g/dL (ref 6.0–8.3)
Sodium: 141 mEq/L (ref 136–145)

## 2018-08-26 LAB — LIPID PANEL
Cholesterol / HDL Ratio: 2.2
Cholesterol: 157 mg/dL (ref 0–199)
HDL: 71 mg/dL (ref 40–9999)
LDL Calculated: 66 mg/dL (ref 0–99)
Triglycerides: 100 mg/dL (ref 34–149)
VLDL Calculated: 20 mg/dL (ref 10–40)

## 2018-08-26 LAB — GFR: EGFR: 8.3

## 2018-08-26 LAB — HEMOLYSIS INDEX: Hemolysis Index: 5 (ref 0–18)

## 2018-08-26 MED ORDER — ONETOUCH DELICA LANCETS 33G MISC: 5 refills | Status: AC

## 2018-08-26 MED ORDER — GLIPIZIDE 5 MG PO TABS
5.0000 mg | ORAL_TABLET | Freq: Two times a day (BID) | ORAL | 1 refills | Status: DC
Start: 2018-08-26 — End: 2019-01-30

## 2018-08-26 MED ORDER — ONETOUCH ULTRA BLUE VI STRP: ORAL_STRIP | 3 refills | Status: AC

## 2018-08-26 MED ORDER — LINAGLIPTIN 5 MG PO TABS
5.0000 mg | ORAL_TABLET | Freq: Every day | ORAL | 1 refills | Status: DC
Start: 2018-08-26 — End: 2019-01-30

## 2018-08-26 NOTE — Progress Notes (Signed)
Subjective:      Date: 08/26/2018 1:55 PM   Patient ID: Jacqueline Weber is a 67 y.o. female.    Chief Complaint:  Chief Complaint   Patient presents with   . Uncontrolled type 2 diabetes mellitus with hyperglycemia       HPI    Visit type: follow up  Type: Type II and non-insulin requiring  Evaluation: diagnosed years ago  Last follow-up: 3 months ago     Diabetes Medication Regimen: Tradjenta 5 mg daily , glipizide 2.5 mg BID   Medication effectiveness/adherence: working well and general adherence with medications  Medication side effects: none  Presenting symptoms at time of diagnosis: random glucose >200 mg/dL  Prior medications: Metformin d/c'd due to ESRD     Current symptoms: has been having cough for couple of months and was prescribed abt, completed abt.  Following Pulmonologist.     Patient denies: no other symptoms including chest pain, shortness of breath, hypoglycemic episodes, polydipsia, polyuria, headaches, dizziness, or abdominal pain  Exercise: walking  Diet: inconsistent compliance with and recommended diet;   Complications: ESRD on hemodialysis - started hemodialysis October 2018   Pertinent medical history: CAD, hyperlipidemia and stroke, ESRD on HD     Home glucose readings: Download 30 day period average 187; highest BS 405; lowest BS 79      A1c result: 7.2 (September  2019) (A1C not as accurate due anemia from esrd)   A1c trend is variable  Microalbumin trend: No results found for: MICROALBUMIN - n/a due to ESRD on HD   Microalbumin trend is n/a  LDL trend:   Lab Results   Component Value Date    LDL 68 05/21/2018    LDL 57 11/15/2017    LDL 83 10/16/2016     LDL trend is at goal  Additional concerns: In addition, pt has hyperlipidemia and is compliant with lipid therapy.  Pt denies side effects of lipid therapy - specifically denies myalgia. Has HTN, no evidence of CHF and is compliant with medications; hx of stroke 4-5 years ago with no residual neuro deficits; Had quadruple bypass > 1  year ago, currently no anginal symptoms; developed ESRD and started hemodialysis October 2018, dialysis days MWF.     Last eye exam was earlier this year.      Hx of stroke 4 to 5 years ago, no neuro deficit.     Problem List:  Patient Active Problem List   Diagnosis   . HTN (hypertension) urgency   . Hyperlipidemia   . History of CVA (cerebrovascular accident)   . Elevated brain natriuretic peptide (BNP) level   . Hyponatremia   . NSTEMI (non-ST elevated myocardial infarction)   . End stage renal disease   . Acute bronchitis   . Anemia in chronic kidney disease (CODE)   . Iron deficiency anemia, unspecified iron deficiency anemia type   . Chronic renal failure, stage 5   . Diabetes mellitus   . Bronchiectasis       Current Medications:  Current Outpatient Medications   Medication Sig Dispense Refill   . ADVAIR DISKUS 250-50 MCG/DOSE Aerosol Powder, Breath Activtivatede INHALE 1 DOSE BY MOUTH TWICE DAILY  5   . aspirin EC 325 MG EC tablet Take 1 tablet (325 mg total) by mouth daily. 30 tablet 0   . carvedilol (COREG) 3.125 MG tablet Take 3.125 mg by mouth daily.         . Cholecalciferol (VITAMIN D3) 2000 units Tab Take  4,000 IU by mouth daily.     . ferrous sulfate 324 (65 FE) MG Tablet Delayed Response TAKE 1 TABLET BY MOUTH IN THE MORNING WITH BREAKFAST 90 tablet 1   . fluticasone (FLONASE) 50 MCG/ACT nasal spray USE 2 SPRAY(S) IN EACH NOSTRIL ONCE DAILY 1 Bottle 3   . glipiZIDE (GLUCOTROL) 5 MG tablet Take 0.5 tablets (2.5 mg total) by mouth 2 (two) times daily before meals 45 tablet 1   . levoFLOXacin (LEVAQUIN) 500 MG tablet Take 500 mg by mouth daily     . Linagliptin (TRADJENTA) 5 MG Tab Take 1 tablet (5 mg total) by mouth daily 90 tablet 1   . montelukast (SINGULAIR) 10 MG tablet as needed.     . Olopatadine HCl 0.2 % Solution INSTILL 1 DROP INTO AFFECTED EYE ONCE DAILY 3 Bottle 3   . ONE TOUCH ULTRA TEST test strip Check blood sugar 2 times daily 200 each 3   . pantoprazole (PROTONIX) 40 MG tablet TAKE 1  TABLET BY MOUTH IN THE MORNING BEFORE BREAKFAST 90 tablet 4   . RENVELA 800 MG tablet 1,600 mg 3 (three) times daily with meals.         . simvastatin (ZOCOR) 20 MG tablet Take 20 mg by mouth nightly.         [name] Umeclidinium Bromide (INCRUSE ELLIPTA IN) Inhale into the lungs daily         . predniSONE 5 MG (21) Tablet Therapy Pack Take by mouth       No current facility-administered medications for this visit.        Allergies:  Allergies   Allergen Reactions   . Mosquito (Culex Pipiens) Allergy Skin Test Shortness Of Breath     And  Trouble  breathing   . Shellfish Allergy Hives   . Shellfish-Derived Products Hives     New  allergy  New  allergy   . Lisinopril Hives and Rash     Patient cannot remember the exact nature of the allergy   . Penicillins Rash and Swelling     numbness  Swelling,  numbness       Past Medical History:  Past Medical History:   Diagnosis Date   . Anemia 10/02/2016    H/O ANEMIA - REFLECTED ON CURRENT LABS.   [name] Cataracts, bilateral     H/O CATARACTS - SURGERY DONE - STILL HAS SOME ISSUES.   . Cerebrovascular accident 2014    H/O STROKE - WEAKNESS ON LEFT SIDE. NO NEUROLOGIST. USES CANE FOR BALANCE.   [name] Chronic renal failure, stage 5 05/08/2017   . Constipation    . Coronary artery disease 06/2016    H/O QUADRUPLE BYPASS SURGERY - FOLLOWED BY [facility].   . Diabetes mellitus    . Difficulty walking     WALKS WITH CANE. WILL USE WC IN HOSPITAL   . End-stage renal disease 04/2017    DIALYSIS [facility]. HAS PERMACATH RIGHT CHEST   . Gastroesophageal reflux disease     MED EFFECTIVE    . History of MI (myocardial infarction)    . 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 Hypercholesteremia     CONTROLLED WITH MEDS.   [name] Hypertension     CONTROLLED WITH MEDS.   [name] Myocardial infarction 06/2016   . Pre-diabetes    . Renal insufficiency     CHRONIC KIDNEY DISEASE - FOLLOWED BY DR. Kate Sable.   [name] Shortness  of breath     MODERATE EXERTION    . Type 2 diabetes mellitus, controlled     AM FSBS  150-200. INSTRUCTED TO CONTROL DIET CLOSELY TONIGHT.  HAD BEEN OFF JANUVIA, RESTARTED 06/15/17. HA1C 9 06/15/17. MANAGED BY PCP.        Past Surgical History:  Past Surgical History:   Procedure Laterality Date   . BRONCHOSCOPY, FIBEROPTIC N/A 06/21/2017    Procedure: BRONCHOSCOPY with brushing and wash;  Surgeon: Danae Chen, MD;  Location: [facility];  Service: Pulmonary;  Laterality: N/A;   . CORONARY ARTERY BYPASS N/A 06/12/2016    Procedure: Coronary Artery Bypass x 4.;  Surgeon: Bjorn Loser, MD;  Location[facility];  Service: Cardiothoracic;  Laterality: N/A;  LIMA to LDA  Vein to PDA  Vein to OM  Vein to Diagonal   . EGD, COLONOSCOPY N/A 10/04/2016   . EGD, COLONOSCOPY N/A 10/04/2016    Procedure: EGD w/ bx, COLONOSCOPY w/ bx, polypectomy;  Surgeon: Norton Blizzard, MD;  Location: [place] [facility];  Service: Gastroenterology;  Laterality: N/A;   . ENDOSCOPIC,VEIN HARVEST N/A 06/12/2016    Procedure: Endoscopic, Left Leg Greater Saphenous Vein Harvest;  Surgeon: Bjorn Loser, MD;  Location: [facility];  Service: Cardiothoracic;  Laterality: N/A;  Left Leg Incision (medially, groin to ankle) @ 0742  Vein out of leg @ 0826  Vein prep complete @ 0845  Total time = 63 minutes   . FORMATION, UPPER EXTREMITY, A-V FISTULA Left 05/31/2017    Procedure: FORMATION, UPPER EXTREMITY, A-V FISTULA;  Surgeon: Cherene Altes, MD;  Location: [place] [facility];  Service: Vascular;  Laterality: Left;  LEFT ARM A-V FISTULA     . REVISION, A-V FISTULA UPPER EXTREMITY Left 07/16/2017    Procedure: REVISION, A-V FISTULA UPPER EXTREMITY WITH PATCH ANGIOPLASTY;  Surgeon: Cherene Altes, MD;  Location: [place] [facility];  Service: Vascular;  Laterality: Left;       Family History:  Family History   Problem Relation Age of Onset   . No known problems Mother    . No known problems Father        Social History:  Social History     Socioeconomic History   . Marital status: Widowed     Spouse name:  Not on file   . Number of children: Not on file   . Years of education: Not on file   . Highest education level: Not on file   Occupational History   . Not on file   Social Needs   . Financial resource strain: Not on file   . Food insecurity:     Worry: Not on file     Inability: Not on file   . Transportation needs:     Medical: Not on file     Non-medical: Not on file   Tobacco Use   . Smoking status: Never Smoker   . Smokeless tobacco: Never Used   Substance and Sexual Activity   . Alcohol use: No   . Drug use: No   . Sexual activity: Never   Lifestyle   . Physical activity:     Days per week: Not on file     Minutes per session: Not on file   . Stress: Not on file   Relationships   . Social connections:     Talks on phone: Not on file     Gets together: Not on file  Attends religious service: Not on file     Active member of club or organization: Not on file     Attends meetings of clubs or organizations: Not on file     Relationship status: Not on file   . Intimate partner violence:     Fear of current or ex partner: Not on file     Emotionally abused: Not on file     Physically abused: Not on file     Forced sexual activity: Not on file   Other Topics Concern   . Not on file   Social History Narrative   . Not on file       The following sections were reviewed this encounter by the provider:   Tobacco  Allergies  Meds  Med Hx  Surg Hx  Fam Hx  Soc Hx        Vitals:  BP 163/75 (BP Site: Right arm, Patient Position: Sitting, Cuff Size: Medium)   Pulse 93   Resp 16   Ht 1.486 m (4' 10.5")   Wt 67.6 kg (149 lb)   SpO2 95%   BMI 30.61 kg/m      ROS: A complete 12 point ROS was obtained. Pertinent positives and negatives as noted in HPI. All other systems are negative.    Physical Examination     General appearance: alert, appears stated age, cooperative and no distress  Eyes: conjunctivae/corneas clear. PERRL, EOM's intact.   Neck: no cervical lymphadenopathy, no carotid bruit,  supple, symmetrical,  trachea midline   Thyroid:normal sized on inspection; non tender to palpation and no discrete nodules palpated  Lungs: clear to auscultation bilaterally  Heart: regular rate and rhythm, S1, S2 normal, no murmur, click, rub or gallop  Abdomen: soft, non-tender; bowel sounds normal; no masses,  no organomegaly  Extremities: extremities normal, atraumatic, no cyanosis or edema  Skin: Skin color, texture, turgor normal. No rashes or lesions  Neurologic: Alert and oriented X 3, normal strength and tone. Normal symmetric reflexes. No tremor    Assessment and Plan     1. Uncontrolled type 2 diabetes mellitus with hyperglycemia  -   Lab Results   Component Value Date    HGBA1C 7.2 (H) 05/21/2018    HGBA1C 5.4 02/19/2018    HGBA1C 6.3 (H) 11/15/2017       Current A1C is at an appropriate goal range for this patient (goal A1C 7.0-7.5%). A1C not as accurate due to anemia from ESRD.  BS readings estimated A1C 8.1-8.2  Recommend increasing the dose of Sulfonylurea therapy. Side effects of Sulfonylurea (hypoglycemia, nausea, dizziness, headache, weight gain) discussed with patient. Recommend home glucometer testing twice a day (pre-breakfast or 2 hrs post-prandial).  Retinopathy surveillance is UTD.       - notes elevated post prandial BS - increase Glipizide to 5 mg AC BID    - Urine Microalbumin Random  - Comprehensive metabolic panel  - Lipid panel  - TSH  - Fructosamine    2. Hyperlipidemia  -  LDL at goal < 70     - on statin and well tolerated    3. Hypertension  - normally  controlled but elevated at this visit due to ongoing cough   - recommend ambulatory BP monitoring     - on Amlodipine 10 mg daily, Carvedilol 25 mg BID, Clonidine 0.1 mg daily, Hydralazine 100 mg TID    4. Stroke   - no residual neuro deficits  - on Aspirin and  statin for secondary stroke prevention    5. ESRD on HD  -   - tolerating HD without complications  - dialysis days MWF     RTC in 3 months      Ernesto Rutherford MSN FNP-BC  [facility]

## 2018-08-27 ENCOUNTER — Encounter (INDEPENDENT_AMBULATORY_CARE_PROVIDER_SITE_OTHER): Payer: Self-pay | Admitting: "Endocrinology

## 2018-08-27 LAB — TSH: TSH: 1.08 u[IU]/mL (ref 0.35–4.94)

## 2018-08-27 LAB — MICROALBUMIN, RANDOM URINE
Urine Creatinine, Random: 159.3 mg/dL
Urine Microalbumin, Random: 819 — ABNORMAL HIGH (ref 0.0–30.0)
Urine Microalbumin/Creatinine Ratio: 514 ug/mg — ABNORMAL HIGH (ref 0–30)

## 2018-08-29 LAB — FRUCTOSAMINE: Fructosamine: 363 — ABNORMAL HIGH (ref 200–285)

## 2018-09-11 ENCOUNTER — Encounter (INDEPENDENT_AMBULATORY_CARE_PROVIDER_SITE_OTHER): Payer: Self-pay

## 2018-09-12 ENCOUNTER — Encounter (INDEPENDENT_AMBULATORY_CARE_PROVIDER_SITE_OTHER): Payer: Self-pay

## 2018-09-13 ENCOUNTER — Encounter (INDEPENDENT_AMBULATORY_CARE_PROVIDER_SITE_OTHER): Payer: Self-pay | Admitting: "Endocrinology

## 2018-10-03 ENCOUNTER — Encounter (INDEPENDENT_AMBULATORY_CARE_PROVIDER_SITE_OTHER): Payer: Self-pay | Admitting: "Endocrinology

## 2018-10-16 ENCOUNTER — Encounter (INDEPENDENT_AMBULATORY_CARE_PROVIDER_SITE_OTHER): Payer: Self-pay

## 2018-10-21 ENCOUNTER — Other Ambulatory Visit: Payer: Self-pay | Admitting: Critical Care Medicine

## 2018-10-22 ENCOUNTER — Encounter (INDEPENDENT_AMBULATORY_CARE_PROVIDER_SITE_OTHER): Payer: Self-pay | Admitting: Internal Medicine

## 2018-10-28 ENCOUNTER — Ambulatory Visit (INDEPENDENT_AMBULATORY_CARE_PROVIDER_SITE_OTHER): Payer: Self-pay | Admitting: Cardiovascular Disease

## 2018-10-28 ENCOUNTER — Encounter (INDEPENDENT_AMBULATORY_CARE_PROVIDER_SITE_OTHER): Payer: Self-pay | Admitting: Internal Medicine

## 2018-10-29 ENCOUNTER — Other Ambulatory Visit: Payer: Self-pay | Admitting: Internal Medicine

## 2018-10-29 ENCOUNTER — Other Ambulatory Visit: Payer: Self-pay | Admitting: Critical Care Medicine

## 2018-10-30 ENCOUNTER — Encounter (INDEPENDENT_AMBULATORY_CARE_PROVIDER_SITE_OTHER): Payer: Self-pay | Admitting: Internal Medicine

## 2018-10-30 ENCOUNTER — Other Ambulatory Visit (INDEPENDENT_AMBULATORY_CARE_PROVIDER_SITE_OTHER): Payer: Self-pay | Admitting: Internal Medicine

## 2018-10-30 DIAGNOSIS — M8580 Other specified disorders of bone density and structure, unspecified site: Secondary | ICD-10-CM

## 2018-10-30 DIAGNOSIS — Z1239 Encounter for other screening for malignant neoplasm of breast: Secondary | ICD-10-CM

## 2018-10-30 DIAGNOSIS — M818 Other osteoporosis without current pathological fracture: Secondary | ICD-10-CM | POA: Insufficient documentation

## 2018-10-30 DIAGNOSIS — M8589 Other specified disorders of bone density and structure, multiple sites: Secondary | ICD-10-CM | POA: Insufficient documentation

## 2018-10-30 HISTORY — DX: Other specified disorders of bone density and structure, multiple sites: M85.89

## 2018-11-02 ENCOUNTER — Telehealth (INDEPENDENT_AMBULATORY_CARE_PROVIDER_SITE_OTHER): Payer: Self-pay | Admitting: Internal Medicine

## 2018-11-02 NOTE — Telephone Encounter (Signed)
Please send pt letter with my email message as the pt did not read the email.    Your dexa scan shows osteopenia.   Please return for a lab visit appointment so I can check a Vitamin D level   Your diet should include 1200 mg of calcium a day.   You should also participate in weight bearing exercise.   The dexa scan should be repeated in 2 years.

## 2018-11-04 ENCOUNTER — Encounter (INDEPENDENT_AMBULATORY_CARE_PROVIDER_SITE_OTHER): Payer: Self-pay | Admitting: Internal Medicine

## 2018-11-04 NOTE — Telephone Encounter (Signed)
Letter mailed out to patient address on file.

## 2018-11-05 ENCOUNTER — Encounter (INDEPENDENT_AMBULATORY_CARE_PROVIDER_SITE_OTHER): Payer: Self-pay

## 2018-11-10 ENCOUNTER — Encounter (INDEPENDENT_AMBULATORY_CARE_PROVIDER_SITE_OTHER): Payer: Self-pay

## 2018-11-11 ENCOUNTER — Ambulatory Visit (INDEPENDENT_AMBULATORY_CARE_PROVIDER_SITE_OTHER): Payer: Self-pay

## 2018-11-11 ENCOUNTER — Other Ambulatory Visit (INDEPENDENT_AMBULATORY_CARE_PROVIDER_SITE_OTHER): Payer: Self-pay

## 2018-11-11 ENCOUNTER — Encounter (INDEPENDENT_AMBULATORY_CARE_PROVIDER_SITE_OTHER): Payer: Self-pay

## 2018-11-12 LAB — VAHRT HISTORIC LVEF: Ejection Fraction: 75 %

## 2018-11-14 ENCOUNTER — Encounter (INDEPENDENT_AMBULATORY_CARE_PROVIDER_SITE_OTHER): Payer: Self-pay | Admitting: Internal Medicine

## 2018-11-21 ENCOUNTER — Other Ambulatory Visit: Payer: Self-pay | Admitting: Critical Care Medicine

## 2018-11-21 DIAGNOSIS — J479 Bronchiectasis, uncomplicated: Secondary | ICD-10-CM

## 2018-11-21 DIAGNOSIS — J45909 Unspecified asthma, uncomplicated: Secondary | ICD-10-CM

## 2018-11-25 ENCOUNTER — Ambulatory Visit
Admission: RE | Admit: 2018-11-25 | Discharge: 2018-11-25 | Disposition: A | Discharge status: Home / Self Care | Payer: Medicare Other | Source: Ambulatory Visit | Attending: Critical Care Medicine | Admitting: Critical Care Medicine

## 2018-11-25 DIAGNOSIS — I2510 Atherosclerotic heart disease of native coronary artery without angina pectoris: Secondary | ICD-10-CM | POA: Insufficient documentation

## 2018-11-25 DIAGNOSIS — J479 Bronchiectasis, uncomplicated: Secondary | ICD-10-CM | POA: Insufficient documentation

## 2018-11-25 DIAGNOSIS — J8410 Pulmonary fibrosis, unspecified: Secondary | ICD-10-CM | POA: Insufficient documentation

## 2018-11-25 DIAGNOSIS — M479 Spondylosis, unspecified: Secondary | ICD-10-CM | POA: Insufficient documentation

## 2018-11-25 DIAGNOSIS — Z951 Presence of aortocoronary bypass graft: Secondary | ICD-10-CM | POA: Insufficient documentation

## 2018-11-25 DIAGNOSIS — Z95828 Presence of other vascular implants and grafts: Secondary | ICD-10-CM | POA: Insufficient documentation

## 2018-11-25 DIAGNOSIS — K3189 Other diseases of stomach and duodenum: Secondary | ICD-10-CM | POA: Insufficient documentation

## 2018-11-25 DIAGNOSIS — J984 Other disorders of lung: Secondary | ICD-10-CM | POA: Insufficient documentation

## 2018-11-25 DIAGNOSIS — I517 Cardiomegaly: Secondary | ICD-10-CM | POA: Insufficient documentation

## 2018-11-25 DIAGNOSIS — J45909 Unspecified asthma, uncomplicated: Secondary | ICD-10-CM | POA: Insufficient documentation

## 2018-11-26 ENCOUNTER — Encounter (INDEPENDENT_AMBULATORY_CARE_PROVIDER_SITE_OTHER): Payer: Self-pay

## 2018-11-28 ENCOUNTER — Telehealth (INDEPENDENT_AMBULATORY_CARE_PROVIDER_SITE_OTHER): Payer: Medicare Other | Admitting: "Endocrinology

## 2018-11-28 NOTE — Telephone Encounter (Signed)
TELEPHONE VISIT              CLINICAL SUMMARY        Verbal Consent      Verbal consent has been obtained from the patient to conduct a telephone visit encounter to minimize exposure to COVID-19: yes       Chief Complaint:      Diabetes       Problem List, Medications, and Allergies reviewed: yes       HPI:    Follow-up: type 2 diabetes   Last office visit:  Dec 2019   Medication regimen:  Tradjenta 5 mg daily;  Glipizide 5 mg ac bid   Medication compliance: patient is not taking the Glipizide consistently, reports she is not taking when her BS is "low."      Patient is not checking BS on a regular basis, she does not check her BS on dialysis days (mwf)     Home glucose readings:   Mar 11 - at 8 am - 167  Mar 14 - 9:39 am - 173  Mar 17 - 10 am - 180  Mar 19 - 4:30 pm - 109  Mar 24 - 11:30 am - 92  Mar 25 - 8 am - 163  Mar 26 - 11:40 am - 143     Diet: has been better since her last office visit, not eating as much sugary foods as before   Exercise: none     Interval events/sx:  Patient is staying stable and no sx since her last office visit.  She continues hemodialysis without any issues.        Assessment/Plan:      1) Diabetes   - reviewed recent BS readings which are generally at target goal   - continue current regimen  - recommend monitoring BS BID and we will evaluate BS on dialysis vs non dialysis days to determine dose of Glipizide.     - continue carb controlled diet and recommend staying physically active as tolerated     RTC in 8 weeks     Ernesto Rutherford MSN FNP-BC  [facility]          Time spent in medical discussion: 11 to 20 minutes

## 2018-12-11 ENCOUNTER — Encounter (INDEPENDENT_AMBULATORY_CARE_PROVIDER_SITE_OTHER): Payer: Self-pay

## 2018-12-12 ENCOUNTER — Encounter (INDEPENDENT_AMBULATORY_CARE_PROVIDER_SITE_OTHER): Payer: Self-pay

## 2019-01-10 ENCOUNTER — Encounter (INDEPENDENT_AMBULATORY_CARE_PROVIDER_SITE_OTHER): Payer: Self-pay

## 2019-01-13 ENCOUNTER — Encounter (INDEPENDENT_AMBULATORY_CARE_PROVIDER_SITE_OTHER): Payer: Self-pay

## 2019-01-29 ENCOUNTER — Encounter (INDEPENDENT_AMBULATORY_CARE_PROVIDER_SITE_OTHER): Payer: Self-pay

## 2019-01-30 ENCOUNTER — Telehealth (INDEPENDENT_AMBULATORY_CARE_PROVIDER_SITE_OTHER): Payer: Medicare Other | Admitting: "Endocrinology

## 2019-01-30 ENCOUNTER — Encounter (INDEPENDENT_AMBULATORY_CARE_PROVIDER_SITE_OTHER): Payer: Self-pay | Admitting: "Endocrinology

## 2019-01-30 ENCOUNTER — Encounter (INDEPENDENT_AMBULATORY_CARE_PROVIDER_SITE_OTHER): Payer: Self-pay

## 2019-01-30 VITALS — Ht 58.5 in | Wt 158.7 lb

## 2019-01-30 DIAGNOSIS — E1165 Type 2 diabetes mellitus with hyperglycemia: Secondary | ICD-10-CM

## 2019-01-30 MED ORDER — LINAGLIPTIN 5 MG PO TABS
5.0000 mg | ORAL_TABLET | Freq: Every day | ORAL | 1 refills | Status: DC
Start: 2019-01-30 — End: 2019-06-12

## 2019-01-30 MED ORDER — GLIPIZIDE 10 MG PO TABS
10.0000 mg | ORAL_TABLET | Freq: Two times a day (BID) | ORAL | 1 refills | Status: DC
Start: 2019-01-30 — End: 2019-07-18

## 2019-01-30 NOTE — Patient Instructions (Signed)
1) Increase Glipizide to 10 mg twice daily.  Take 15 minutes before first meal of the day and 15 minutes before dinner.     2) continue current dose Tradjenta     3) check blood sugar twice daily:    - before first meal of the day - blood sugar goals are 90-140     - 2 hours after dinner - blood sugar goals are 150-180

## 2019-01-30 NOTE — Progress Notes (Signed)
Subjective:      Date: 01/30/2019 3:02 PM   Patient ID: Jacqueline Weber is a 68 y.o. female.    Chief Complaint:  Chief Complaint   Patient presents with    Diabetes     type 2      Verbal consent has been obtained from the patient to conduct a video and telephone visit to minimize exposure to COVID-19: yes    HPI    Visit type: follow up  Type: Type II and non-insulin requiring  Evaluation: diagnosed years ago  Last follow-up: December 2019      Diabetes Medication Regimen: Tradjenta 5 mg daily , glipizide 5  mg BID   Medication effectiveness/adherence: partially effective and general adherence with medications  Medication side effects: none  Presenting symptoms at time of diagnosis: random glucose >200 mg/dL     Prior medications: Metformin d/c'd due to ESRD     Current symptoms: none     Patient denies: no other symptoms including chest pain, shortness of breath, hypoglycemic episodes, polydipsia, polyuria, headaches, dizziness, or abdominal pain  Exercise: walking and household chores   Diet: poor compliance with recommended diet;   Complications: ESRD on hemodialysis - started hemodialysis October 2018   Pertinent medical history: CAD, hyperlipidemia and stroke, ESRD on HD     Home glucose readings: BS readings:  Random BS:  176  204  187  347  261  188  226  222  183  168  241  306  183  203  250  253  200    - BS in the 300s are after eating cookies      A1c result: 9.0 (estimated from fructosamine - 12/19)      A1c trend is above goal  Microalbumin trend: No results found for: MICROALBUMIN - n/a due to ESRD on HD   Microalbumin trend is n/a  LDL trend:   Lab Results   Component Value Date    LDL 66 08/26/2018    LDL 68 05/21/2018    LDL 57 11/15/2017     LDL trend is at goal  Additional concerns: In addition, pt has hyperlipidemia and is compliant with lipid therapy.  Pt denies side effects of lipid therapy - specifically denies myalgia. Has HTN, no evidence of CHF and is compliant with medications; hx of  stroke 4-5 years ago with no residual neuro deficits; Had quadruple bypass > 1 year ago, currently no anginal symptoms; developed ESRD and started hemodialysis October 2018, dialysis days MWF.     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 30 tablet 0    Cholecalciferol (VITAMIN D3) 2000 units Tab Take 4,000 IU by mouth  daily.      ferrous sulfate 324 (65 FE) MG Tablet Delayed Response TAKE 1 TABLET BY MOUTH IN THE MORNING WITH BREAKFAST 90 tablet 1    fluticasone (FLONASE) 50 MCG/ACT nasal spray USE 2 SPRAY(S) IN EACH NOSTRIL ONCE DAILY 1 Bottle 3    glipiZIDE (GLUCOTROL) 5 MG tablet Take 1 tablet (5 mg total) by mouth 2 (two) times daily before meals 180 tablet 1    linaGLIPtin (TRADJENTA) 5 MG Tab Take 1 tablet (5 mg total) by mouth daily 90 tablet 1    montelukast (SINGULAIR) 10 MG tablet as needed.      Olopatadine HCl 0.2 % Solution INSTILL 1 DROP INTO AFFECTED EYE ONCE DAILY 3 Bottle 3    ONE TOUCH ULTRA TEST test strip Check blood sugar 2 times daily 200 each 3    ONETOUCH DELICA LANCETS 36K Misc Check BS BID 200 each 5    pantoprazole (PROTONIX) 40 MG tablet TAKE 1 TABLET BY MOUTH IN THE MORNING BEFORE BREAKFAST 90 tablet 4    predniSONE 5 MG (21) Tablet Therapy Pack Take by mouth      RENVELA 800 MG tablet 1,600 mg 3  (three) times daily with meals.          simvastatin (ZOCOR) 20 MG tablet Take 20 mg by mouth nightly.          Umeclidinium Bromide (INCRUSE ELLIPTA IN) Inhale into the lungs daily          bisoprolol (ZEBETA) 5 MG tablet Take 5 mg by mouth daily      carvedilol (COREG) 3.125 MG tablet Take 3.125 mg by mouth daily.           No current facility-administered medications for this visit.        Allergies:  Allergies   Allergen Reactions    Mosquito (Culex Pipiens) Allergy Skin Test Shortness Of Breath     And  Trouble  breathing    Shellfish Allergy Hives    Shellfish-Derived Products Hives     New  allergy  New  allergy    Lisinopril Hives and Rash     Patient cannot remember the exact nature of the allergy    Penicillins Rash and Swelling     numbness  Swelling,  numbness       Past Medical History:  Past Medical History:   Diagnosis Date    Anemia 10/02/2016    H/O ANEMIA - REFLECTED ON CURRENT LABS.    Cataracts, bilateral     H/O CATARACTS - SURGERY DONE - STILL HAS SOME ISSUES.    Cerebrovascular accident 2014    H/O STROKE - WEAKNESS ON LEFT SIDE. NO NEUROLOGIST. USES CANE FOR BALANCE.    Chronic renal failure, stage 5 05/08/2017    Constipation     Coronary artery disease 06/2016    H/O QUADRUPLE BYPASS SURGERY - FOLLOWED BY [facility].    Diabetes mellitus     Difficulty walking     WALKS WITH CANE. WILL USE WC IN HOSPITAL    End-stage renal disease 04/2017    DIALYSIS [facility] MON-WED-FRI. 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    Hypertension     CONTROLLED WITH MEDS.  Myocardial infarction 06/2016    Osteopenia of multiple sites 10/30/2018    Pre-diabetes     Renal insufficiency     CHRONIC KIDNEY DISEASE - FOLLOWED BY DR. Kate Sable.    Shortness of breath     MODERATE EXERTION     Type 2 diabetes mellitus,  controlled     AM FSBS 150-200. INSTRUCTED TO CONTROL DIET CLOSELY TONIGHT.  HAD BEEN OFF JANUVIA, RESTARTED 06/15/17. HA1C 9 06/15/17. MANAGED BY PCP.        Past Surgical History:  Past Surgical History:   Procedure Laterality Date    BRONCHOSCOPY, FIBEROPTIC N/A 06/21/2017    Procedure: BRONCHOSCOPY with brushing and wash;  Surgeon: Danae Chen, MD;  Location: [facility];  Service: Pulmonary;  Laterality: N/A;    CORONARY ARTERY BYPASS N/A 06/12/2016    Procedure: Coronary Artery Bypass x 4.;  Surgeon: Bjorn Loser, MD;  Location: [facility] HEART OR;  Service: Cardiothoracic;  Laterality: N/A;  LIMA to LDA  Vein to PDA  Vein to OM  Vein to Diagonal    EGD, COLONOSCOPY N/A 10/04/2016    EGD, COLONOSCOPY N/A 10/04/2016    Procedure: EGD w/ bx, COLONOSCOPY w/ bx, polypectomy;  Surgeon: Norton Blizzard, MD;  Location: [facility];  Service: Gastroenterology;  Laterality: N/A;    ENDOSCOPIC,VEIN HARVEST N/A 06/12/2016    Procedure: Endoscopic, Left Leg Greater Saphenous Vein Harvest;  Surgeon: Bjorn Loser, MD;  Location: [facility] HEART OR;  Service: Cardiothoracic;  Laterality: N/A;  Left Leg Incision (medially, groin to ankle) @ 0742  Vein out of leg @ 0826  Vein prep complete @ 0845  Total time = 63 minutes    FORMATION, UPPER EXTREMITY, A-V FISTULA Left 05/31/2017    Procedure: FORMATION, UPPER EXTREMITY, A-V FISTULA;  Surgeon: Cherene Altes, MD;  Location: [place] [facility] OR;  Service: Vascular;  Laterality: Left;  LEFT ARM A-V FISTULA      REVISION, A-V FISTULA UPPER EXTREMITY Left 07/16/2017    Procedure: REVISION, A-V FISTULA UPPER EXTREMITY WITH PATCH ANGIOPLASTY;  Surgeon: Cherene Altes, MD;  [facility];  Service: Vascular;  Laterality: Left;       Family History:  Family History   Problem Relation Age of Onset    No known problems Mother     No known problems Father        Social History:  Social History     Socioeconomic History    Marital status:  Widowed     Spouse name: Not on file    Number of children: Not on file    Years of education: Not on file    Highest education level: Not on file   Occupational History    Not on file   Social Needs    Financial resource strain: Not on file    Food insecurity:     Worry: Not on file     Inability: Not on file    Transportation needs:     Medical: Not on file     Non-medical: Not on file   Tobacco Use    Smoking status: Never Smoker    Smokeless tobacco: Never Used   Substance and Sexual Activity    Alcohol use: No    Drug use: No    Sexual activity: Never   Lifestyle    Physical activity:     Days per week: Not on file     Minutes per session: Not  on file    Stress: Not on file   Relationships    Social connections:     Talks on phone: Not on file     Gets together: Not on file     Attends religious service: Not on file     Active member of club or organization: Not on file     Attends meetings of clubs or organizations: Not on file     Relationship status: Not on file    Intimate partner violence:     Fear of current or ex partner: Not on file     Emotionally abused: Not on file     Physically abused: Not on file     Forced sexual activity: Not on file   Other Topics Concern    Not on file   Social History Narrative    Not on file       The following sections were reviewed this encounter by the provider:   Tobacco   Allergies   Meds   Problems          Vitals:  Ht 1.486 m (4' 10.5")    Wt 72 kg (158 lb 11.7 oz)    BMI 32.61 kg/m      ROS: A complete 12 point ROS was obtained. Pertinent positives and negatives as noted in HPI. All other systems are negative.    Physical Examination     General appearance: alert, appears stated age, cooperative and no distress      Assessment and Plan     1. Uncontrolled type 2 diabetes mellitus with hyperglycemia with ESRD on hemodialysis     - fructosamine 363 - estimated A1C of 9 %       Lab Results   Component Value Date    HGBA1C 7.2 (H) 05/21/2018    HGBA1C  5.4 02/19/2018    HGBA1C 6.3 (H) 11/15/2017       Current A1C is elevated above goal and trend has increased compared to prior values..  Recommend adding the following DM medication class to the current regimen - Sulfonylurea. Side effects of Sulfonylurea (hypoglycemia, nausea, dizziness, headache, weight gain) discussed with patient. Recommend home glucometer testing twice a day (pre-breakfast or 2 hrs post-prandial).  Recommend therapeutic lifestyle changes which include optimizing low carbohydrate diet and aerobic exercise efforts.     - BS are elevated due to dietary indiscretion   - counseled at length importance of keeping BS controlled    - increase Glipizide to 10 mg AC BID       - Hemoglobin A1C; Future  - Comprehensive metabolic panel; Future  - Lipid panel; Future  - Fructosamine; Future  - CBC and differential; Future       2. Hyperlipidemia  -  LDL at goal < 70     - on statin and well tolerated    3. Hypertension  - normally  controlled     - recommend ambulatory BP monitoring     - on Amlodipine 10 mg daily, Carvedilol 25 mg BID, Clonidine 0.1 mg daily, Hydralazine 100 mg TID    4. Stroke   - no residual neuro deficits  - on Aspirin and statin for secondary stroke prevention    5. ESRD on HD  -   - tolerating HD without complications  - dialysis days MWF     RTC in 6 weeks     Ernesto Rutherford MSN FNP-BC  [facility]

## 2019-02-03 ENCOUNTER — Telehealth (INDEPENDENT_AMBULATORY_CARE_PROVIDER_SITE_OTHER): Payer: Self-pay

## 2019-02-03 ENCOUNTER — Ambulatory Visit (INDEPENDENT_AMBULATORY_CARE_PROVIDER_SITE_OTHER): Payer: Self-pay | Admitting: Cardiovascular Disease

## 2019-02-03 ENCOUNTER — Other Ambulatory Visit (INDEPENDENT_AMBULATORY_CARE_PROVIDER_SITE_OTHER): Payer: Self-pay

## 2019-02-03 ENCOUNTER — Other Ambulatory Visit (INDEPENDENT_AMBULATORY_CARE_PROVIDER_SITE_OTHER): Payer: Self-pay | Admitting: Internal Medicine

## 2019-02-03 DIAGNOSIS — E1165 Type 2 diabetes mellitus with hyperglycemia: Secondary | ICD-10-CM

## 2019-02-03 NOTE — Telephone Encounter (Signed)
-----   Message from Stacie Glaze, [place] sent at 01/30/2019  3:48 PM EDT -----  Regarding: lab order  Please mail lab order to patient's address on chart .  Thanks!

## 2019-02-03 NOTE — Telephone Encounter (Signed)
Mailed

## 2019-02-10 ENCOUNTER — Encounter (INDEPENDENT_AMBULATORY_CARE_PROVIDER_SITE_OTHER): Payer: Self-pay | Admitting: Internal Medicine

## 2019-02-10 ENCOUNTER — Encounter (INDEPENDENT_AMBULATORY_CARE_PROVIDER_SITE_OTHER): Payer: Self-pay

## 2019-02-11 ENCOUNTER — Encounter (INDEPENDENT_AMBULATORY_CARE_PROVIDER_SITE_OTHER): Payer: Self-pay

## 2019-02-18 ENCOUNTER — Ambulatory Visit (INDEPENDENT_AMBULATORY_CARE_PROVIDER_SITE_OTHER): Payer: Self-pay | Admitting: Cardiovascular Disease

## 2019-02-25 ENCOUNTER — Encounter (INDEPENDENT_AMBULATORY_CARE_PROVIDER_SITE_OTHER): Payer: Self-pay | Admitting: Internal Medicine

## 2019-03-31 ENCOUNTER — Telehealth (INDEPENDENT_AMBULATORY_CARE_PROVIDER_SITE_OTHER): Payer: Medicare Other | Admitting: "Endocrinology

## 2019-04-01 ENCOUNTER — Encounter (INDEPENDENT_AMBULATORY_CARE_PROVIDER_SITE_OTHER): Payer: Self-pay | Admitting: "Endocrinology

## 2019-04-01 ENCOUNTER — Telehealth (INDEPENDENT_AMBULATORY_CARE_PROVIDER_SITE_OTHER): Payer: Medicare Other | Admitting: "Endocrinology

## 2019-04-01 DIAGNOSIS — Z992 Dependence on renal dialysis: Secondary | ICD-10-CM

## 2019-04-01 DIAGNOSIS — I639 Cerebral infarction, unspecified: Secondary | ICD-10-CM

## 2019-04-01 DIAGNOSIS — E1122 Type 2 diabetes mellitus with diabetic chronic kidney disease: Secondary | ICD-10-CM

## 2019-04-01 DIAGNOSIS — E1165 Type 2 diabetes mellitus with hyperglycemia: Secondary | ICD-10-CM

## 2019-04-01 DIAGNOSIS — Z7984 Long term (current) use of oral hypoglycemic drugs: Secondary | ICD-10-CM

## 2019-04-01 DIAGNOSIS — E785 Hyperlipidemia, unspecified: Secondary | ICD-10-CM

## 2019-04-01 DIAGNOSIS — N186 End stage renal disease: Secondary | ICD-10-CM

## 2019-04-01 DIAGNOSIS — I120 Hypertensive chronic kidney disease with stage 5 chronic kidney disease or end stage renal disease: Secondary | ICD-10-CM

## 2019-04-01 NOTE — Progress Notes (Signed)
Subjective:      Date: 04/01/2019 11:38 AM   Patient ID: Jacqueline Weber is a 68 y.o. female.    Chief Complaint:  Chief Complaint   Patient presents with    Uncontrolled type 2 diabetes mellitus with hyperglycemia     Verbal consent has been obtained from the patient to conduct a video and telephone visit to minimize exposure to COVID-19: yes    HPI    Visit type: follow up  Type: Type II and non-insulin requiring  Evaluation: diagnosed years ago  Last follow-up: 3 months ago     Diabetes Medication Regimen: Tradjenta 5 mg daily , glipizide 5  mg BID   Medication effectiveness/adherence: partially effective and general adherence with medications  Medication side effects: none  Presenting symptoms at time of diagnosis: random glucose >200 mg/dL     Prior medications: Metformin d/c'd due to ESRD     Current symptoms: none     Patient denies: no other symptoms including chest pain, shortness of breath, hypoglycemic episodes, polydipsia, polyuria, headaches, dizziness, or abdominal pain  Exercise: walking and household chores     Diet: poor compliance with recommended diet; since visit notes diet has been a little better but she has been snacking late at night   Complications: ESRD on hemodialysis - started hemodialysis October 2018   Pertinent medical history: CAD, hyperlipidemia and stroke, ESRD on HD     Home glucose readings: BS readings:      Fasting:  158  168  180  166  187  191      After dinner:  207 234  241  182  310  229    A1c result: 9.0 (estimated from fructosamine - 12/19)      A1c trend is above goal  Microalbumin trend: No results found for: MICROALBUMIN - n/a due to ESRD on HD   Microalbumin trend is n/a  LDL trend:   Lab Results   Component Value Date    LDL 66 08/26/2018    LDL 68 05/21/2018    LDL 57 11/15/2017     LDL trend is at goal  Additional concerns: In addition, pt has hyperlipidemia and is compliant with lipid therapy.  Pt denies side effects of lipid therapy - specifically denies  myalgia. Has HTN, no evidence of CHF and is compliant with medications; hx of stroke 4-5 years ago with no residual neuro deficits; Had quadruple bypass > 1 year ago, currently no anginal symptoms; developed ESRD and started hemodialysis October 2018, dialysis days MWF.     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 30 tablet 0    bisoprolol (ZEBETA) 5 MG tablet Take 5 mg  by mouth daily      carvedilol (COREG) 3.125 MG tablet Take 3.125 mg by mouth daily.          ferrous sulfate 324 (65 FE) MG Tablet Delayed Response TAKE 1 TABLET BY MOUTH IN THE MORNING WITH BREAKFAST 90 tablet 3    fluticasone (FLONASE) 50 MCG/ACT nasal spray USE 2 SPRAY(S) IN EACH NOSTRIL ONCE DAILY 1 Bottle 3    glipiZIDE (GLUCOTROL) 10 MG tablet Take 1 tablet (10 mg total) by mouth 2 (two) times daily before meals 180 tablet 1    linaGLIPtin (TRADJENTA) 5 MG Tab Take 1 tablet (5 mg total) by mouth daily 90 tablet 1    montelukast (SINGULAIR) 10 MG tablet as needed.      ONETOUCH DELICA LANCETS 83M Misc Check BS BID 200 each 5    pantoprazole (PROTONIX) 40 MG tablet TAKE 1 TABLET BY MOUTH IN THE MORNING BEFORE BREAKFAST 90 tablet 4    RENVELA 800 MG tablet 1,600 mg 3 (three) times daily with meals.          simvastatin (ZOCOR) 20 MG tablet Take 20 mg by mouth  nightly.          Umeclidinium Bromide (INCRUSE ELLIPTA IN) Inhale into the lungs daily          Cholecalciferol (VITAMIN D3) 2000 units Tab Take 4,000 IU by mouth daily.      Olopatadine HCl 0.2 % Solution INSTILL 1 DROP INTO AFFECTED EYE ONCE DAILY 3 Bottle 3    ONE TOUCH ULTRA TEST test strip Check blood sugar 2 times daily 200 each 3    predniSONE 5 MG (21) Tablet Therapy Pack Take by mouth       No current facility-administered medications for this visit.        Allergies:  Allergies   Allergen Reactions    Mosquito (Culex Pipiens) Allergy Skin Test Shortness Of Breath     And  Trouble  breathing    Shellfish Allergy Hives    Shellfish-Derived Products Hives     New  allergy  New  allergy    Lisinopril Hives and Rash     Patient cannot remember the exact nature of the allergy    Penicillins Rash and Swelling     numbness  Swelling,  numbness       Past Medical History:  Past Medical History:   Diagnosis Date    Anemia 10/02/2016    H/O ANEMIA - REFLECTED ON CURRENT LABS.    Cataracts, bilateral     H/O CATARACTS - SURGERY DONE - STILL HAS SOME ISSUES.    Cerebrovascular accident 2014    H/O STROKE - WEAKNESS ON LEFT SIDE. NO NEUROLOGIST. USES CANE FOR BALANCE.    Chronic renal failure, stage 5 05/08/2017    Constipation     Coronary artery disease 06/2016    H/O QUADRUPLE BYPASS SURGERY - FOLLOWED BY [facility].    Diabetes mellitus     Difficulty walking     WALKS WITH CANE. WILL USE WC IN HOSPITAL    End-stage renal disease 04/2017    DIALYSIS [facility] MON-WED-FRI. 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    Hypertension     CONTROLLED WITH  MEDS.    Myocardial infarction 06/2016    Osteopenia of multiple sites 10/30/2018    Pre-diabetes     Renal insufficiency     CHRONIC KIDNEY DISEASE - FOLLOWED BY DR. Kate Sable.     Shortness of breath     MODERATE EXERTION     Type 2 diabetes mellitus, controlled     AM FSBS 150-200. INSTRUCTED TO CONTROL DIET CLOSELY TONIGHT.  HAD BEEN OFF JANUVIA, RESTARTED 06/15/17. HA1C 9 06/15/17. MANAGED BY PCP.        Past Surgical History:  Past Surgical History:   Procedure Laterality Date    BRONCHOSCOPY, FIBEROPTIC N/A 06/21/2017    Procedure: BRONCHOSCOPY with brushing and wash;  Surgeon: Danae Chen, MD;  Location: [facility] OR;  Service: Pulmonary;  Laterality: N/A;    CORONARY ARTERY BYPASS N/A 06/12/2016    Procedure: Coronary Artery Bypass x 4.;  Surgeon: Bjorn Loser, MD;  Location: [facility] OR;  Service: Cardiothoracic;  Laterality: N/A;  LIMA to LDA  Vein to PDA  Vein to OM  Vein to Diagonal    EGD, COLONOSCOPY N/A 10/04/2016    EGD, COLONOSCOPY N/A 10/04/2016    Procedure: EGD w/ bx, COLONOSCOPY w/ bx, polypectomy;  Surgeon: Norton Blizzard, MD;  Location:  ENDOSCOPY OR;  Service: Gastroenterology;  Laterality: N/A;    ENDOSCOPIC,VEIN HARVEST N/A 06/12/2016    Procedure: Endoscopic, Left Leg Greater Saphenous Vein Harvest;  Surgeon: Bjorn Loser, MD;  Location: [facility] HEART OR;  Service: Cardiothoracic;  Laterality: N/A;  Left Leg Incision (medially, groin to ankle) @ 0742  Vein out of leg @ 0826  Vein prep complete @ 0845  Total time = 63 minutes    FORMATION, UPPER EXTREMITY, A-V FISTULA Left 05/31/2017    Procedure: FORMATION, UPPER EXTREMITY, A-V FISTULA;  Surgeon: Cherene Altes, MD;  Location: [place] MA[facility] OR;  Service: Vascular;  Laterality: Left;  LEFT ARM A-V FISTULA      REVISION, A-V FISTULA UPPER EXTREMITY Left 07/16/2017    Procedure: REVISION, A-V FISTULA UPPER EXTREMITY WITH PATCH ANGIOPLASTY;  Surgeon: Cherene Altes, MD;  [facility]: [facility];  Service: Vascular;  Laterality: Left;       Family History:  Family History   Problem Relation Age of Onset    No known problems Mother     No known problems Father        Social  History:  Social History     Socioeconomic History    Marital status: Widowed     Spouse name: Not on file    Number of children: Not on file    Years of education: Not on file    Highest education level: Not on file   Occupational History    Not on file   Social Needs    Financial resource strain: Not on file    Food insecurity     Worry: Not on file     Inability: Not on file    Transportation needs     Medical: Not on file     Non-medical: Not on file   Tobacco Use    Smoking status: Never Smoker    Smokeless tobacco: Never Used   Substance and Sexual Activity    Alcohol use: No    Drug use: No    Sexual activity: Never   Lifestyle    Physical activity     Days per week: Not on file  Minutes per session: Not on file    Stress: Not on file   Relationships    Social connections     Talks on phone: Not on file     Gets together: Not on file     Attends religious service: Not on file     Active member of club or organization: Not on file     Attends meetings of clubs or organizations: Not on file     Relationship status: Not on file    Intimate partner violence     Fear of current or ex partner: Not on file     Emotionally abused: Not on file     Physically abused: Not on file     Forced sexual activity: Not on file   Other Topics Concern    Not on file   Social History Narrative    Not on file       The following sections were reviewed this encounter by the provider:   Tobacco   Allergies   Meds   Problems          Vitals:  BP 140/82    Pulse 72    Wt 71.7 kg (158 lb)    BMI 32.46 kg/m      ROS: A complete 12 point ROS was obtained. Pertinent positives and negatives as noted in HPI. All other systems are negative.    Physical Examination     General appearance: alert, appears stated age, cooperative and no distress      Assessment and Plan     1. Uncontrolled type 2 diabetes mellitus with hyperglycemia with ESRD on hemodialysis     - fructosamine 363 - estimated A1C of 9 %       Lab Results    Component Value Date    HGBA1C 7.2 (H) 05/21/2018    HGBA1C 5.4 02/19/2018    HGBA1C 6.3 (H) 11/15/2017       Current A1C is elevated above goal and trend has increased compared to prior values..  Recommend adding the following DM medication class to the current regimen - Sulfonylurea. Side effects of Sulfonylurea (hypoglycemia, nausea, dizziness, headache, weight gain) discussed with patient. Recommend home glucometer testing twice a day (pre-breakfast or 2 hrs post-prandial).  Recommend therapeutic lifestyle changes which include optimizing low carbohydrate diet and aerobic exercise efforts.     - BS are better due to improved diet  - discussed initiation of insulin to control BS if not able to control on current regimen and diet modification   - will f/u in the office in 6 weeks, will have labs done prior to the visit and will initiate insulin therapy if indicated upon review of labs and home glucose readings    - counseled at length importance of keeping BS controlled      2. Hyperlipidemia  -  LDL at goal < 70     - on statin and well tolerated    3. Hypertension  - normally  controlled     - recommend ambulatory BP monitoring     - on Amlodipine 10 mg daily, Carvedilol 25 mg BID, Clonidine 0.1 mg daily, Hydralazine 100 mg TID    4. Stroke   - no residual neuro deficits  - on Aspirin and statin for secondary stroke prevention    5. ESRD on HD  -   - tolerating HD without complications  - dialysis days MWF     RTC in 6 weeks  Ernesto Rutherford MSN FNP-BC  [facility]

## 2019-04-09 ENCOUNTER — Telehealth (INDEPENDENT_AMBULATORY_CARE_PROVIDER_SITE_OTHER): Payer: Self-pay

## 2019-04-09 NOTE — Telephone Encounter (Signed)
Pt's daughter called to request copy of lab requisition to be picked up. Copy of order put at front desk to pick up tomorrow.

## 2019-04-10 ENCOUNTER — Other Ambulatory Visit (INDEPENDENT_AMBULATORY_CARE_PROVIDER_SITE_OTHER): Payer: Self-pay | Admitting: "Endocrinology

## 2019-04-15 LAB — HEMOGLOBIN A1C: Hemoglobin A1C: 6.1 % of total Hgb — ABNORMAL HIGH (ref <5.7)

## 2019-04-15 LAB — COMPREHENSIVE METABOLIC PANEL
ALT: 9 U/L (ref 6–29)
AST (SGOT): 13 U/L (ref 10–35)
Albumin/Globulin Ratio: 1.3 (calc) (ref 1.0–2.5)
Albumin: 4 g/dL (ref 3.6–5.1)
Alkaline Phosphatase: 100 U/L (ref 37–153)
BUN / Creatinine Ratio: 9 (calc) (ref 6–22)
BUN: 41 mg/dL — ABNORMAL HIGH (ref 7–25)
Bilirubin, Total: 0.4 mg/dL (ref 0.2–1.2)
CO2: 31 mmol/L (ref 20–32)
Calcium: 9.5 mg/dL (ref 8.6–10.4)
Chloride: 96 mmol/L — ABNORMAL LOW (ref 98–110)
Creatinine: 4.65 mg/dL — ABNORMAL HIGH (ref 0.50–0.99)
EGFR African American: 11 mL/min/{1.73_m2} — ABNORMAL LOW (ref >=60)
Globulin: 3.2 g/dL (calc) (ref 1.9–3.7)
Glucose: 142 mg/dL — ABNORMAL HIGH (ref 65–99)
NON-AFRICAN AMERICA EGFR: 9 mL/min/{1.73_m2} — ABNORMAL LOW (ref >=60)
Potassium: 5.4 mmol/L — ABNORMAL HIGH (ref 3.5–5.3)
Protein, Total: 7.2 g/dL (ref 6.1–8.1)
Sodium: 138 mmol/L (ref 135–146)

## 2019-04-15 LAB — CBC AND DIFFERENTIAL
Baso(Absolute): 37 cells/uL (ref 0–200)
Basophils: 0.5 %
Eosinophils Absolute: 237 cells/uL (ref 15–500)
Eosinophils: 3.2 %
Hematocrit: 32.9 % — ABNORMAL LOW (ref 35.0–45.0)
Hemoglobin: 10.2 g/dL — ABNORMAL LOW (ref 11.7–15.5)
Lymphocytes Absolute: 2028 cells/uL (ref 850–3900)
Lymphocytes: 27.4 %
MCH: 24.3 pg — ABNORMAL LOW (ref 27.0–33.0)
MCHC: 31 g/dL — ABNORMAL LOW (ref 32.0–36.0)
MCV: 78.3 fL — ABNORMAL LOW (ref 80.0–100.0)
MPV: 11.8 fL (ref 7.5–12.5)
Monocytes Absolute: 629 cells/uL (ref 200–950)
Monocytes: 8.5 %
Neutrophils Absolute: 4470 cells/uL (ref 1500–7800)
Neutrophils: 60.4 %
Platelets: 279 10*3/uL (ref 140–400)
RBC: 4.2 10*6/uL (ref 3.80–5.10)
RDW: 15.5 % — ABNORMAL HIGH (ref 11.0–15.0)
WBC: 7.4 10*3/uL (ref 3.8–10.8)

## 2019-04-15 LAB — LIPID PANEL
Cholesterol / HDL Ratio: 3 (calc) (ref <5.0)
Cholesterol: 148 mg/dL (ref <200)
HDL: 50 mg/dL (ref >=50)
LDL Calculated: 77 mg/dL (calc)
NON HDL CHOLESTEROL: 98 mg/dL (calc) (ref <130)
Triglycerides: 119 mg/dL (ref <150)

## 2019-04-15 LAB — FRUCTOSAMINE: Fructosamine: 395 umol/L — ABNORMAL HIGH (ref 205–285)

## 2019-04-22 ENCOUNTER — Other Ambulatory Visit (INDEPENDENT_AMBULATORY_CARE_PROVIDER_SITE_OTHER): Payer: Self-pay | Admitting: Internal Medicine

## 2019-04-22 DIAGNOSIS — J302 Other seasonal allergic rhinitis: Secondary | ICD-10-CM

## 2019-04-22 DIAGNOSIS — R0982 Postnasal drip: Secondary | ICD-10-CM

## 2019-05-16 ENCOUNTER — Encounter (INDEPENDENT_AMBULATORY_CARE_PROVIDER_SITE_OTHER): Payer: Self-pay

## 2019-05-16 ENCOUNTER — Ambulatory Visit (INDEPENDENT_AMBULATORY_CARE_PROVIDER_SITE_OTHER): Payer: Medicare Other | Admitting: "Endocrinology

## 2019-05-19 ENCOUNTER — Ambulatory Visit
Admission: RE | Admit: 2019-05-19 | Discharge: 2019-05-19 | Disposition: A | Discharge status: Home / Self Care | Payer: Medicare Other | Source: Ambulatory Visit | Attending: Critical Care Medicine | Admitting: Critical Care Medicine

## 2019-05-19 DIAGNOSIS — J479 Bronchiectasis, uncomplicated: Secondary | ICD-10-CM | POA: Insufficient documentation

## 2019-05-19 DIAGNOSIS — J45909 Unspecified asthma, uncomplicated: Secondary | ICD-10-CM | POA: Insufficient documentation

## 2019-05-19 LAB — BASIC METABOLIC PANEL
Anion Gap: 15 (ref 5.0–15.0)
BUN: 14 mg/dL (ref 7.0–19.0)
CO2: 28 mEq/L (ref 22–29)
Calcium: 9.6 mg/dL (ref 8.5–10.5)
Chloride: 98 mEq/L — ABNORMAL LOW (ref 100–111)
Creatinine: 2.5 mg/dL — ABNORMAL HIGH (ref 0.6–1.0)
Glucose: 125 mg/dL — ABNORMAL HIGH (ref 70–100)
Potassium: 3.7 mEq/L (ref 3.5–5.1)
Sodium: 141 mEq/L (ref 136–145)

## 2019-05-19 LAB — CBC AND DIFFERENTIAL
Absolute NRBC: 0 10*3/uL (ref 0.00–0.00)
Basophils Absolute Automated: 0.02 10*3/uL (ref 0.00–0.08)
Basophils Automated: 0.3 %
Eosinophils Absolute Automated: 0.22 10*3/uL (ref 0.00–0.44)
Eosinophils Automated: 3.6 %
Hematocrit: 35.1 % (ref 34.7–43.7)
Hgb: 10.9 g/dL — ABNORMAL LOW (ref 11.4–14.8)
Immature Granulocytes Absolute: 0.02 10*3/uL (ref 0.00–0.07)
Immature Granulocytes: 0.3 %
Lymphocytes Absolute Automated: 1.32 10*3/uL (ref 0.42–3.22)
Lymphocytes Automated: 21.5 %
MCH: 24.2 pg — ABNORMAL LOW (ref 25.1–33.5)
MCHC: 31.1 g/dL — ABNORMAL LOW (ref 31.5–35.8)
MCV: 77.8 fL — ABNORMAL LOW (ref 78.0–96.0)
MPV: 10.6 fL (ref 8.9–12.5)
Monocytes Absolute Automated: 0.42 10*3/uL (ref 0.21–0.85)
Monocytes: 6.9 %
Neutrophils Absolute: 4.13 10*3/uL (ref 1.10–6.33)
Neutrophils: 67.4 %
Nucleated RBC: 0 /100 WBC (ref 0.0–0.0)
Platelets: 243 10*3/uL (ref 142–346)
RBC: 4.51 10*6/uL (ref 3.90–5.10)
RDW: 15 % (ref 11–15)
WBC: 6.13 10*3/uL (ref 3.10–9.50)

## 2019-05-19 LAB — PT AND APTT
PT INR: 0.9 (ref 0.9–1.1)
PT: 12.1 s — ABNORMAL LOW (ref 12.6–15.0)
PTT: 31 s (ref 23–37)

## 2019-05-19 LAB — GFR: EGFR: 19.2

## 2019-05-20 ENCOUNTER — Telehealth (INDEPENDENT_AMBULATORY_CARE_PROVIDER_SITE_OTHER): Payer: Medicare Other | Admitting: "Endocrinology

## 2019-05-20 ENCOUNTER — Encounter (INDEPENDENT_AMBULATORY_CARE_PROVIDER_SITE_OTHER): Payer: Self-pay | Admitting: Internal Medicine

## 2019-05-20 ENCOUNTER — Ambulatory Visit (INDEPENDENT_AMBULATORY_CARE_PROVIDER_SITE_OTHER): Payer: Self-pay | Admitting: Cardiovascular Disease

## 2019-05-20 ENCOUNTER — Ambulatory Visit (FREE_STANDING_LABORATORY_FACILITY): Payer: Medicare Other

## 2019-05-20 ENCOUNTER — Encounter (INDEPENDENT_AMBULATORY_CARE_PROVIDER_SITE_OTHER): Payer: Self-pay | Admitting: "Endocrinology

## 2019-05-20 ENCOUNTER — Encounter: Payer: Self-pay | Admitting: Critical Care Medicine

## 2019-05-20 ENCOUNTER — Telehealth: Payer: Self-pay

## 2019-05-20 DIAGNOSIS — Z1159 Encounter for screening for other viral diseases: Secondary | ICD-10-CM

## 2019-05-20 DIAGNOSIS — I120 Hypertensive chronic kidney disease with stage 5 chronic kidney disease or end stage renal disease: Secondary | ICD-10-CM

## 2019-05-20 DIAGNOSIS — N186 End stage renal disease: Secondary | ICD-10-CM

## 2019-05-20 DIAGNOSIS — Z794 Long term (current) use of insulin: Secondary | ICD-10-CM

## 2019-05-20 DIAGNOSIS — E1122 Type 2 diabetes mellitus with diabetic chronic kidney disease: Secondary | ICD-10-CM

## 2019-05-20 DIAGNOSIS — E1165 Type 2 diabetes mellitus with hyperglycemia: Secondary | ICD-10-CM

## 2019-05-20 DIAGNOSIS — Z01818 Encounter for other preprocedural examination: Secondary | ICD-10-CM

## 2019-05-20 DIAGNOSIS — Z992 Dependence on renal dialysis: Secondary | ICD-10-CM

## 2019-05-20 NOTE — Pre-Procedure Instructions (Addendum)
Patient Access Screening Script For Potential COVID-19 Persons Under Investigation (PUI)  QUESTIONS RESPONSE    (TYPE YES OR NO)   Are you currently experiencing fever, and/or symptoms of acute respiratory illness (such as cough, difficulty breathing)?  no   Do you have a history of travel from affected geographic areas [email or web address]  within 14 days of symptom onset?    no   Did you have close contact with a laboratory-confirmed COVID-19 person?    no   Do you reside in a nursing home or long-term care facility or assisted living facility? no     If yes to any of the questions,   Assure the caller that just because they answered yes does not mean they have COVID-19, and they should meet with their primary care provider to assess their signs and symptoms and to notify their surgeon's office.     Please advise patient,  "If your current situation changes between now and the night before surgery, please call your surgeon."  "Thank you for adapting to the ever changing scenario!"   SPECIAL INSTRUCTIONS FOR THE PSS NAVIGATORS:  CONTACT ISOLATION:     TRANSLATOR REQUESTED:  declined  SPECIAL INSTRUCTIONS:  Interview with daughter, Arna Snipe, she will be present day of surgery.  hemedialysis M,W,F @ [facility], [place]  TRANSPORTATION:        TESTING - DAY OF SURGERY:  Fbs,bmp,cbc  PREGNANCY TEST WAIVERS:    ABNORMAL LABS:        SPECIAL CONSIDERATIONS-BLOOD PRODUCTS:    SPECIAL NEEDS FOR PATIENT - BEHAVIORAL /SENSORY:       PLEASE DO NOT REPLY TO THIS EMAIL. IF YOU HAVE ANY QUESTIONS, PLEASE CONTACT THE PREOP INTERVIEW OFFICE AT [phone] OR [phone]) [facility].      Date of Procedure: 9/17    Arrival Time: 730am    Procedure Time: 9am     Someone from the hospital will call you after 4pm the business day prior to your scheduled procedure to verify the arrival and procedure times listed above. The purpose of this call is to advise you of any scheduling  changes that may have occurred since the time of your telephone interview with the pre surgical services nurse.    NOTE FROM THE ANESTHESIA DEPARTMENT:  Please note that your procedure may be cancelled or rescheduled if you do not complete the required testing, preop clearances and/or follow the medication instructions that are required by your surgeon and /or the anesthesia department.    Requirements marked with an "X" below (i.e.: testing, etc.) Must be completed prior to your procedure :    Please call Dr Camila Li to make appointment for medical clearance for surgery.  You will need an ekg as well as a cardiac clearance for surgery from Dr Vonzella Nipple.         ( x ) Eating: You should not have anything to eat after  11pm.    ( x ) Drinking: Drink clear liquids until  300am .    CLEAR LIQUIDS INCLUDE: WATER, BLACK COFFEE/TEA (NO MILK, CREAM, NON-DAIRY CREAMER OR SUGAR), CARBONATED BEVERAGES, JUICES WITHOUT PULP (APPLE, CRANBERRY, GRAPE) AND GATORADE.        Medication Instructions:    Hold Tradjenta day of surgery  Hold glipizide day of surgery  Hold iron day of surgery  Hold vitamin d3 day of surgery  Take carvidilol day of surgery  Take bisoprolol day of surgery  Use Flonase day of surgery  Take Singulair if needed  Hold Renvela day of surgery  Take simvastatin the night prior  Aspirin instructions from Dr Vonzella Nipple, [facility] cardioilogist  Use Ellipta inhaler day of surgery  Take prednisone day of surgery  Use Advair inhaler day of surgery  Take hydralazine day of surgery  Take olopadine eye drops          YOU SHOULD NOT TAKE IBUPROFEN, MOTRIN, ADVIL, ALEVE, NSAIDS AS OF 9/10 .     YOU MAY TAKE TYLENOL AS NEEDED.    Hospital Address and Arrival Instructions:   [address] [place], [place] [address]  The [facility] is where you will enter.  A complimentary valet is available at the front [place] entrance of the hospital.  Please call [phone] morning of surgery if you need assistance upon  arrival.    Upon arrival in the hospital main lobby via [employer], you will proceed to:  - Patient Registration, which is all the way down the hall on the left.  - Once registration is completed, you will be escorted by the registration staff to the Surgical Services Waiting Area, where you wait until a PreOp clinical team member escorts you to a room and initiates the PreOp process.  - You will need to have a valid photo I.D., insurance card and co-pay form of payment if required by insurance.    Pre Surgical General Instructions:  1. Bathe or shower the morning of the procedure with an anti-bacterial soap before arriving (unless instructed to use Chlorhexidine (CHG) or Hibiclens).   2. Do not apply lotion, perfume, cologne, or hair-care products such as hair spray or gels.  3. Do not shave your surgical site at home.  4. Do not wear makeup, jewelry including body piercing, watches, earrings, or rings.   5. You may brush your teeth and gargle on the morning of surgery but do not swallow any water.  6. Wear casual, loose fitting and comfortable clothing. A gown will be provided.  7. Plan to leave unnecessary valuables, credit cards (except for co-pay payment use) and jewelry at home or with a companion on day of surgery for safe keeping. The hospital is not responsible for lost/stolen items.  8. If you wear contacts please leave them at home. If you wear glasses, please bring a case.  9. Dentures - we will provide a container  10. Hearing aids, you may bring dos but you will be asked to remove them before surgery.  11. Please arrange for someone to drive you home. For your safety you will not be allowed to drive home after sedation or anesthesia. A responsible adult must be present to accompany you home when you are ready to leave. We strongly recommend that all patients have an adult at home with them for the first 24 hours after surgery.  42. Notify your doctor if you develop any sign of illness before the  date of your surgery. Report symptoms such as: high fever, sore throat, or other infection, breathing difficulties or chest pain.  13. Discontinue herbal supplements and herbal/green tea one week prior to surgery.    Below is a link/web address to the Preparing for Your Procedure video that walks you through the surgical experience.   [email or web address]    Possible Anesthesia Side Effects:   Nausea and vomiting. This common side effect usually occurs immediately after the procedure, but some people may continue to feel sick for a day or two. Anti-nausea medicines can help.  Dry mouth. You may feel parched when you wake up. As long as you're not too nauseated, sipping water can help take care of your dry mouth.   Sore throat or hoarseness. The tube put in your throat to help you breathe during surgery can leave you with a sore throat after it's removed.   Chills and shivering. It's common for your body temperature to drop during general anesthesia. Your doctors and nurses will make sure your temperature doesn't fall too much during surgery, but you may wake up shivering and feeling cold. Your chills may last for a few minutes to hours.   Confusion and fuzzy thinking. When first waking from anesthesia, you may feel confused, drowsy, and foggy. This usually lasts for just a few hours, but for some people -- especially older adults -- confusion can last for days or weeks.   Muscle aches. The drugs used to relax your muscles during surgery can cause soreness afterward.   Itching. If narcotic (opioid) medications are used during or after your operation, you may be itchy. This is a common side effect of this class of drugs.   Bladder problems. You may have difficulty passing urine for a short time after general anesthesia.   Dizziness. You may feel dizzy when you first stand up. Drinking plenty of fluids should help you feel better.

## 2019-05-20 NOTE — Progress Notes (Signed)
Subjective:      Date: 05/20/2019 2:26 PM   Patient ID: Jacqueline Weber is a 68 y.o. female.    Chief Complaint:  Chief Complaint   Patient presents with    Diabetes     Verbal consent has been obtained from the patient to conduct a video and telephone visit to minimize exposure to COVID-19: yes    HPI    Visit type: follow up  Type: Type II and non-insulin requiring  Evaluation: diagnosed years ago  Last follow-up: 04/01/19      Diabetes Medication Regimen: Tradjenta 5 mg daily , glipizide 10  mg BID     Ran out of Tradjenta few weeks ago, was not able to refill due to cost.  Discussed with patient's daughter Jacqueline Weber that we will resend Rx and will work on completing prior auth if needed if Jacqueline Weber is not the preferred DPP-4      Medication effectiveness/adherence: partially effective and general adherence with medications  Medication side effects: none  Presenting symptoms at time of diagnosis: random glucose >200 mg/dL     Prior medications: Metformin d/c'd due to ESRD     Current symptoms: none     Patient denies: no other symptoms including chest pain, shortness of breath, hypoglycemic episodes, polydipsia, polyuria, headaches, dizziness, or abdominal pain     Exercise: walking and household chores     Diet: poor compliance with recommended diet; since visit notes diet has been a little better but she has been snacking late at night     Complications: ESRD on hemodialysis - started hemodialysis October 2018   Pertinent medical history: CAD, hyperlipidemia and stroke, ESRD on HD     Home glucose readings: BS readings:      Fasting:  195 159  168  193  190 196     After dinner: 259 211  203      A1c result: 9.5 (estimated from fructosamine - 04/15/19)     A1c trend is above goal  Microalbumin trend: No results found for: MICROALBUMIN - n/a due to ESRD on HD   Microalbumin trend is n/a  LDL trend:   Lab Results   Component Value Date    LDL 77 04/10/2019    LDL 66 08/26/2018    LDL 68 05/21/2018     LDL trend is  at goal  Additional concerns: In addition, pt has hyperlipidemia and is compliant with lipid therapy.  Pt denies side effects of lipid therapy - specifically denies myalgia. Has HTN, no evidence of CHF and is compliant with medications; hx of stroke 4-5 years ago with no residual neuro deficits; Had quadruple bypass > 1 year ago, currently no anginal symptoms; developed ESRD and started hemodialysis October 2018, dialysis days MWF.     Overdue for eye exam     Patient was recommended in person f/u visit because the plan was to start insulin given persistently elevated BS. Patient missed previous appt due to conflict with other medical appt.  Discussed initiation of basal insulin to improve BS control, recommend nurse visit for insulin educ administration.        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 30 tablet 0    bisoprolol (ZEBETA) 5 MG tablet Take 5 mg by mouth daily      carvedilol (COREG) 3.125 MG tablet Take 3.125 mg by mouth daily.          Cholecalciferol (VITAMIN D3) 2000 units Tab Take 4,000 IU by mouth daily.      ferrous sulfate 324 (65 FE) MG Tablet Delayed Response TAKE 1 TABLET BY MOUTH IN THE MORNING WITH BREAKFAST 90 tablet 3    fluticasone (FLONASE) 50 MCG/ACT nasal spray Use 2 spray(s) in each nostril once daily 16 g 5    glipiZIDE  (GLUCOTROL) 10 MG tablet Take 1 tablet (10 mg total) by mouth 2 (two) times daily before meals 180 tablet 1    hydrALAZINE (APRESOLINE) 25 MG tablet Take 25 mg by mouth 2 (two) times daily      linaGLIPtin (TRADJENTA) 5 MG Tab Take 1 tablet (5 mg total) by mouth daily 90 tablet 1    montelukast (SINGULAIR) 10 MG tablet as needed.      Olopatadine HCl 0.2 % Solution INSTILL 1 DROP INTO AFFECTED EYE ONCE DAILY 3 Bottle 3    ONE TOUCH ULTRA TEST test strip Check blood sugar 2 times daily 200 each 3    ONETOUCH DELICA LANCETS 54T Misc Check BS BID 200 each 5    pantoprazole (PROTONIX) 40 MG tablet TAKE 1 TABLET BY MOUTH IN THE MORNING BEFORE BREAKFAST 90 tablet 4    predniSONE 5 MG (21) Tablet Therapy Pack Take by mouth      RENVELA 800 MG tablet 1,600 mg 3 (three) times daily with meals.          simvastatin (ZOCOR) 20 MG tablet Take 20 mg by mouth nightly.          Umeclidinium Bromide (INCRUSE ELLIPTA IN) Inhale into the lungs daily           No current facility-administered medications for this visit.        Allergies:  Allergies   Allergen Reactions    Mosquito (Culex Pipiens) Allergy Skin Test Shortness Of Breath     And  Trouble  breathing    Shellfish Allergy Hives    Shellfish-Derived Products Hives     New  allergy  New  allergy    Lisinopril Hives and Rash     Patient cannot remember the exact nature of the allergy    Penicillins Rash and Swelling     numbness  Swelling,  numbness       Past Medical History:  Past Medical History:   Diagnosis Date    Anemia 10/02/2016    H/O ANEMIA - REFLECTED ON CURRENT LABS.    Asthma NA    Cataracts, bilateral     H/O CATARACTS - SURGERY DONE - STILL HAS SOME ISSUES.    Cerebrovascular accident 2014    H/O STROKE - WEAKNESS ON LEFT SIDE. NO NEUROLOGIST. USES CANE FOR BALANCE.    Chronic renal failure, stage 5 05/08/2017    Constipation  Coronary artery disease 06/2016    H/O QUADRUPLE BYPASS SURGERY - FOLLOWED BY [facility].    Diabetes mellitus      Difficulty walking     WALKS WITH CANE. WILL USE WC IN HOSPITAL    End-stage renal disease 04/2017    DIALYSIS [facility] MON-WED-FRI. 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    Hypertension     CONTROLLED WITH MEDS.    Myocardial infarction 06/2016    Osteopenia of multiple sites 10/30/2018    Pre-diabetes     Renal insufficiency     CHRONIC KIDNEY DISEASE - FOLLOWED BY DR. Kate Sable.    Shortness of breath     MODERATE EXERTION     Type 2 diabetes mellitus, controlled     AM FSBS 160-170. INSTRUCTED TO CONTROL DIET CLOSELY TONIGHT.  HAD BEEN OFF JANUVIA, RESTARTED 06/15/17. HA1C 9 06/15/17. MANAGED BY PCP.        Past Surgical History:  Past Surgical History:   Procedure Laterality Date    BRONCHOSCOPY, FIBEROPTIC N/A 06/21/2017    Procedure: BRONCHOSCOPY with brushing and wash;  Surgeon: Danae Chen, MD;  Location: [facility];  Service: Pulmonary;  Laterality: N/A;    CORONARY ARTERY BYPASS N/A 06/12/2016    Procedure: Coronary Artery Bypass x 4.;  Surgeon: Bjorn Loser, MD;  Location: [facility] OR;  Service: Cardiothoracic;  Laterality: N/A;  LIMA to LDA  Vein to PDA  Vein to OM  Vein to Diagonal    EGD, COLONOSCOPY N/A 10/04/2016    EGD, COLONOSCOPY N/A 10/04/2016    Procedure: EGD w/ bx, COLONOSCOPY w/ bx, polypectomy;  Surgeon: Norton Blizzard, MD;  Location: [facility];  Service: Gastroenterology;  Laterality: N/A;    ENDOSCOPIC,VEIN HARVEST N/A 06/12/2016    Procedure: Endoscopic, Left Leg Greater Saphenous Vein Harvest;  Surgeon: Bjorn Loser, MD;  Location: [facility];  Service: Cardiothoracic;  Laterality: N/A;  Left Leg Incision (medially, groin to ankle) @ 0742  Vein out of leg @ 0826  Vein prep complete @ 0845  Total time = 63 minutes    FORMATION, UPPER EXTREMITY, A-V FISTULA Left  05/31/2017    Procedure: FORMATION, UPPER EXTREMITY, A-V FISTULA;  Surgeon: Cherene Altes, MD;  Location: [facility];  Service: Vascular;  Laterality: Left;  LEFT ARM A-V FISTULA      REVISION, A-V FISTULA UPPER EXTREMITY Left 07/16/2017    Procedure: REVISION, A-V FISTULA UPPER EXTREMITY WITH PATCH ANGIOPLASTY;  Surgeon: Cherene Altes, MD;  [facility];  Service: Vascular;  Laterality: Left;       Family History:  Family History   Problem Relation Age of Onset    No known problems Mother     No known problems Father        Social History:  Social History     Socioeconomic History    Marital status: Widowed     Spouse name: Not on file    Number of children: Not on file    Years of education: Not on file    Highest education level: Not on file   Occupational History    Not on file   Social Needs  Financial resource strain: Not on file    Food insecurity     Worry: Not on file     Inability: Not on file    Transportation needs     Medical: Not on file     Non-medical: Not on file   Tobacco Use    Smoking status: Never Smoker    Smokeless tobacco: Never Used   Substance and Sexual Activity    Alcohol use: No    Drug use: No    Sexual activity: Never   Lifestyle    Physical activity     Days per week: Not on file     Minutes per session: Not on file    Stress: Not on file   Relationships    Social connections     Talks on phone: Not on file     Gets together: Not on file     Attends religious service: Not on file     Active member of club or organization: Not on file     Attends meetings of clubs or organizations: Not on file     Relationship status: Not on file    Intimate partner violence     Fear of current or ex partner: Not on file     Emotionally abused: Not on file     Physically abused: Not on file     Forced sexual activity: Not on file   Other Topics Concern    Not on file   Social History Narrative    Not on file       The following sections were  reviewed this encounter by the provider:   Tobacco   Allergies   Meds          Vitals:  There were no vitals taken for this visit.     ROS: A complete 12 point ROS was obtained. Pertinent positives and negatives as noted in HPI. All other systems are negative.    Physical Examination     General appearance: alert, appears stated age, cooperative and no distress      Assessment and Plan     1. Uncontrolled type 2 diabetes mellitus with hyperglycemia with ESRD on hemodialysis     - fructosamine 395  - estimated A1C of 9.5  % - increasing from previous       - discussed initiation of basal insulin after patient and daughter completes insulin administration training with the nurse  - restart Tradjenta ASAP - will complete prior auth if needed   - Tradjenta is the only safe DPP-4 for patient given ESRD     - reiterated importance of following carb controlled diet     RTC in 6 weeks after starting basal insulin     Ernesto Rutherford MSN FNP-BC  [facility]

## 2019-05-21 LAB — COVID-19 (SARS-COV-2): SARS CoV 2 Overall Result: NOT DETECTED

## 2019-05-21 NOTE — Anesthesia Preprocedure Evaluation (Addendum)
Anesthesia Evaluation    AIRWAY    Mallampati: I    TM distance: >3 FB  Neck ROM: full  Mouth Opening:full   CARDIOVASCULAR    cardiovascular exam normal       DENTAL         PULMONARY    pulmonary exam normal     OTHER FINDINGS                  Relevant Problems   NEURO/PSYCH   (+) History of CVA (cerebrovascular accident)      CARDIO   (+) HTN (hypertension) urgency   (+) NSTEMI (non-ST elevated myocardial infarction)      GU/RENAL   (+) Chronic renal failure, stage 5      ENDO   (+) Diabetes mellitus               Anesthesia Plan    ASA 3     general               (PSS Anesthesia Comments: ESRD--check K+ in AM     S/p MI, 3-vessel disease, CABG 2017  2020 ECHO: EF 75%; mild-mod AR  EKG 05/2019: NSR    Cardiac Clearance obtained 05/20/2019     CVA , DM, weakness on left side  Pt is taking aspirin daily)                  informed consent obtained                   Signed by: Alda Berthold 05/21/19 2:47 PM

## 2019-05-22 ENCOUNTER — Ambulatory Visit: Payer: Self-pay

## 2019-05-22 ENCOUNTER — Ambulatory Visit: Payer: Medicare Other | Admitting: Certified Registered"

## 2019-05-22 ENCOUNTER — Ambulatory Visit
Admission: RE | Admit: 2019-05-22 | Discharge: 2019-05-22 | Disposition: A | Discharge status: Home / Self Care | Payer: Medicare Other | Source: Ambulatory Visit | Attending: Critical Care Medicine | Admitting: Critical Care Medicine

## 2019-05-22 ENCOUNTER — Ambulatory Visit: Payer: Medicare Other

## 2019-05-22 ENCOUNTER — Encounter
Admission: RE | Disposition: A | Discharge status: Home / Self Care | Payer: Self-pay | Source: Ambulatory Visit | Attending: Critical Care Medicine

## 2019-05-22 DIAGNOSIS — I120 Hypertensive chronic kidney disease with stage 5 chronic kidney disease or end stage renal disease: Secondary | ICD-10-CM | POA: Insufficient documentation

## 2019-05-22 DIAGNOSIS — E1122 Type 2 diabetes mellitus with diabetic chronic kidney disease: Secondary | ICD-10-CM | POA: Insufficient documentation

## 2019-05-22 DIAGNOSIS — J45909 Unspecified asthma, uncomplicated: Secondary | ICD-10-CM | POA: Insufficient documentation

## 2019-05-22 DIAGNOSIS — Z7984 Long term (current) use of oral hypoglycemic drugs: Secondary | ICD-10-CM | POA: Insufficient documentation

## 2019-05-22 DIAGNOSIS — Z8673 Personal history of transient ischemic attack (TIA), and cerebral infarction without residual deficits: Secondary | ICD-10-CM | POA: Insufficient documentation

## 2019-05-22 DIAGNOSIS — I252 Old myocardial infarction: Secondary | ICD-10-CM | POA: Insufficient documentation

## 2019-05-22 DIAGNOSIS — Z992 Dependence on renal dialysis: Secondary | ICD-10-CM | POA: Insufficient documentation

## 2019-05-22 DIAGNOSIS — E1136 Type 2 diabetes mellitus with diabetic cataract: Secondary | ICD-10-CM | POA: Insufficient documentation

## 2019-05-22 DIAGNOSIS — J471 Bronchiectasis with (acute) exacerbation: Secondary | ICD-10-CM | POA: Insufficient documentation

## 2019-05-22 DIAGNOSIS — K219 Gastro-esophageal reflux disease without esophagitis: Secondary | ICD-10-CM | POA: Insufficient documentation

## 2019-05-22 DIAGNOSIS — I2510 Atherosclerotic heart disease of native coronary artery without angina pectoris: Secondary | ICD-10-CM | POA: Insufficient documentation

## 2019-05-22 DIAGNOSIS — N186 End stage renal disease: Secondary | ICD-10-CM | POA: Insufficient documentation

## 2019-05-22 DIAGNOSIS — Z7982 Long term (current) use of aspirin: Secondary | ICD-10-CM | POA: Insufficient documentation

## 2019-05-22 DIAGNOSIS — E7800 Pure hypercholesterolemia, unspecified: Secondary | ICD-10-CM | POA: Insufficient documentation

## 2019-05-22 HISTORY — PX: BRONCHOSCOPY, FIBEROPTIC: SHX3329

## 2019-05-22 LAB — CBC WITH MANUAL DIFFERENTIAL
Absolute NRBC: 0 10*3/uL (ref 0.00–0.00)
Band Neutrophils Absolute: 0.07 10*3/uL (ref 0.00–1.00)
Band Neutrophils: 1 %
Basophils Absolute Manual: 0 10*3/uL (ref 0.00–0.08)
Basophils Manual: 0 %
Cell Morphology: ABNORMAL — AB
Eosinophils Absolute Manual: 0.07 10*3/uL (ref 0.00–0.44)
Eosinophils Manual: 1 %
Hematocrit: 34.5 % — ABNORMAL LOW (ref 34.7–43.7)
Hgb: 10.4 g/dL — ABNORMAL LOW (ref 11.4–14.8)
Lymphocytes Absolute Manual: 1.56 10*3/uL (ref 0.42–3.22)
Lymphocytes Manual: 21 %
MCH: 24.5 pg — ABNORMAL LOW (ref 25.1–33.5)
MCHC: 30.1 g/dL — ABNORMAL LOW (ref 31.5–35.8)
MCV: 81.2 fL (ref 78.0–96.0)
MPV: 11.6 fL (ref 8.9–12.5)
Monocytes Absolute: 0.15 10*3/uL — ABNORMAL LOW (ref 0.21–0.85)
Monocytes Manual: 2 %
Neutrophils Absolute Manual: 5.57 10*3/uL (ref 1.10–6.33)
Nucleated RBC: 0 /100 WBC (ref 0.0–0.0)
Platelet Estimate: NORMAL
Platelets: 240 10*3/uL (ref 142–346)
RBC: 4.25 10*6/uL (ref 3.90–5.10)
RDW: 15 % (ref 11–15)
Segmented Neutrophils: 75 %
WBC: 7.43 10*3/uL (ref 3.10–9.50)

## 2019-05-22 LAB — APTT: PTT: 33 s (ref 23–37)

## 2019-05-22 LAB — BASIC METABOLIC PANEL
Anion Gap: 13 (ref 5.0–15.0)
BUN: 18.3 mg/dL (ref 7.0–19.0)
CO2: 30 mEq/L — ABNORMAL HIGH (ref 22–29)
Calcium: 9.2 mg/dL (ref 8.5–10.5)
Chloride: 97 mEq/L — ABNORMAL LOW (ref 100–111)
Creatinine: 4 mg/dL — ABNORMAL HIGH (ref 0.6–1.0)
Glucose: 153 mg/dL — ABNORMAL HIGH (ref 70–100)
Potassium: 4.5 mEq/L (ref 3.5–5.1)
Sodium: 140 mEq/L (ref 136–145)

## 2019-05-22 LAB — PT/INR
PT INR: 1 (ref 0.9–1.1)
PT: 12.7 s (ref 12.6–15.0)

## 2019-05-22 LAB — GFR: EGFR: 11.2

## 2019-05-22 SURGERY — BRONCHOSCOPY, FIBEROPTIC
Anesthesia: Anesthesia General | Wound class: Clean Contaminated

## 2019-05-22 SURGERY — Surgical Case
Anesthesia: *Unknown

## 2019-05-22 MED ORDER — LIDOCAINE HCL (PF) 2 % IJ SOLN
INTRAMUSCULAR | Status: AC
Start: 2019-05-22 — End: ?
  Filled 2019-05-22: qty 5

## 2019-05-22 MED ORDER — PROMETHAZINE HCL 25 MG/ML IJ SOLN
6.2500 mg | Freq: Once | INTRAMUSCULAR | Status: DC | PRN
Start: 2019-05-22 — End: 2019-05-22

## 2019-05-22 MED ORDER — LIDOCAINE 1% BUFFERED - CNR/OUTSOURCED
0.3000 mL | Freq: Once | INTRAMUSCULAR | Status: AC
Start: 2019-05-22 — End: 2019-05-22
  Administered 2019-05-22: 08:00:00 0.3 mL via INTRADERMAL

## 2019-05-22 MED ORDER — HYDROMORPHONE HCL 0.5 MG/0.5 ML IJ SOLN
0.5000 mg | INTRAMUSCULAR | Status: DC | PRN
Start: 2019-05-22 — End: 2019-05-22

## 2019-05-22 MED ORDER — ONDANSETRON HCL 4 MG/2ML IJ SOLN
INTRAMUSCULAR | Status: DC | PRN
Start: 2019-05-22 — End: 2019-05-22
  Administered 2019-05-22: 4 mg via INTRAVENOUS

## 2019-05-22 MED ORDER — LACTATED RINGERS IV SOLN
INTRAVENOUS | Status: DC
Start: 2019-05-22 — End: 2019-05-22

## 2019-05-22 MED ORDER — PROPOFOL 10 MG/ML IV EMUL (WRAP)
INTRAVENOUS | Status: AC
Start: 2019-05-22 — End: ?
  Filled 2019-05-22: qty 20

## 2019-05-22 MED ORDER — HYDRALAZINE HCL 20 MG/ML IJ SOLN
10.0000 mg | Freq: Once | INTRAMUSCULAR | Status: DC
Start: 2019-05-22 — End: 2019-05-22

## 2019-05-22 MED ORDER — PROPOFOL 10 MG/ML IV EMUL (WRAP)
INTRAVENOUS | Status: DC | PRN
Start: 2019-05-22 — End: 2019-05-22
  Administered 2019-05-22: 130 mg via INTRAVENOUS

## 2019-05-22 MED ORDER — PROPOFOL INFUSION 10 MG/ML
INTRAVENOUS | Status: DC | PRN
Start: 2019-05-22 — End: 2019-05-22
  Administered 2019-05-22: 50 ug/kg/min via INTRAVENOUS

## 2019-05-22 MED ORDER — LIDOCAINE HCL (PF) 2 % IJ SOLN
INTRAMUSCULAR | Status: DC | PRN
Start: 2019-05-22 — End: 2019-05-22
  Administered 2019-05-22: 6 mL

## 2019-05-22 MED ORDER — SODIUM CHLORIDE 0.9 % IV SOLN
INTRAVENOUS | Status: DC
Start: 2019-05-22 — End: 2019-05-22
  Administered 2019-05-22: 08:00:00 500 mL via INTRAVENOUS

## 2019-05-22 MED ORDER — FENTANYL CITRATE (PF) 50 MCG/ML IJ SOLN (WRAP)
INTRAMUSCULAR | Status: DC | PRN
Start: 2019-05-22 — End: 2019-05-22
  Administered 2019-05-22: 50 ug via INTRAVENOUS

## 2019-05-22 MED ORDER — ONDANSETRON HCL 4 MG/2ML IJ SOLN
4.0000 mg | Freq: Once | INTRAMUSCULAR | Status: DC | PRN
Start: 2019-05-22 — End: 2019-05-22

## 2019-05-22 MED ORDER — LABETALOL HCL 5 MG/ML IV SOLN (WRAP)
20.0000 mg | Freq: Once | INTRAVENOUS | Status: DC
Start: 2019-05-22 — End: 2019-05-22

## 2019-05-22 MED ORDER — HYDROMORPHONE HCL 2 MG PO TABS
2.0000 mg | ORAL_TABLET | Freq: Once | ORAL | Status: DC | PRN
Start: 2019-05-22 — End: 2019-05-22

## 2019-05-22 MED ORDER — HYDROCODONE-ACETAMINOPHEN 5-325 MG PO TABS
1.0000 | ORAL_TABLET | Freq: Once | ORAL | Status: DC | PRN
Start: 2019-05-22 — End: 2019-05-22

## 2019-05-22 MED ORDER — FENTANYL CITRATE (PF) 50 MCG/ML IJ SOLN (WRAP)
INTRAMUSCULAR | Status: AC
Start: 2019-05-22 — End: ?
  Filled 2019-05-22: qty 2

## 2019-05-22 MED ORDER — LIDOCAINE HCL (PF) 4 % NEBULIZER
5.0000 mL | Freq: Once | INTRAMUSCULAR | Status: AC
Start: 2019-05-22 — End: 2019-05-22
  Administered 2019-05-22: 09:00:00 5 mL via RESPIRATORY_TRACT
  Filled 2019-05-22: qty 5

## 2019-05-22 MED ORDER — MEPERIDINE HCL 25 MG/ML IJ SOLN
12.5000 mg | INTRAMUSCULAR | Status: DC | PRN
Start: 2019-05-22 — End: 2019-05-22

## 2019-05-22 MED ORDER — AMMONIA AROMATIC IN INHA
1.0000 | Freq: Once | RESPIRATORY_TRACT | Status: DC | PRN
Start: 2019-05-22 — End: 2019-05-22

## 2019-05-22 MED ORDER — LIDOCAINE HCL 2 % IJ SOLN
INTRAMUSCULAR | Status: DC | PRN
Start: 2019-05-22 — End: 2019-05-22
  Administered 2019-05-22: 40 mg via INTRAVENOUS

## 2019-05-22 MED ORDER — ACETAMINOPHEN 500 MG PO TABS
500.0000 mg | ORAL_TABLET | Freq: Once | ORAL | Status: DC | PRN
Start: 2019-05-22 — End: 2019-05-22

## 2019-05-22 MED ORDER — ALBUTEROL-IPRATROPIUM 2.5-0.5 (3) MG/3ML IN SOLN
3.0000 mL | Freq: Four times a day (QID) | RESPIRATORY_TRACT | Status: DC
Start: 2019-05-22 — End: 2019-05-22
  Administered 2019-05-22: 09:00:00 3 mL via RESPIRATORY_TRACT
  Filled 2019-05-22: qty 3

## 2019-05-22 SURGICAL SUPPLY — 50 items
BLOCK BITE 60FR LF STRAP FLXB SH DISP (Endoscopic Supplies) ×1
BLOCK BITE OD60 FR ADULT STRAP FLEXIBLE (Endoscopic Supplies) ×1
BLOCK BITE OD60 FR ADULT STRAP FLEXIBLE SIDEHOLE (Endoscopic Supplies) ×1 IMPLANT
BRUSH CYTO PTFE CLBR 1.5MM 140CM STRL (Brushes) ×1
BRUSH CYTOLOGY L140 CM BULLET TIP (Brushes) ×1
BRUSH CYTOLOGY L140 CM BULLET TIP ERGONOMIC HANDLE AUTOMATIC STOP (Brushes) ×1 IMPLANT
CATHETER URETHRAL OD18 FR 30 CC FOLEY 2 (Catheter Urine)
CATHETER URETHRAL OD18 FR 30 CC FOLEY 2 WAY BALLOON ATRAUMATIC (Catheter Urine) IMPLANT
CATHETER URTH BACTI-GRD NTR RBR DDRGL 30 (Catheter Urine)
GLOVE EXAM MEDIUM L9.5 IN CHEMOTHERAPY (Glove) ×2
GLOVE EXAM MEDIUM L9.5 IN CHEMOTHERAPY POWDER FREE TEXTURE BEAD CUFF (Glove) ×2 IMPLANT
GLOVE EXM MED K-C PUR NTR LF CHMO TXTR (Glove) ×2
GOWN ISL SMS REG LG QUICKCOMPLY LF LVL 2 (Gown)
GOWN ISL SMS XL LF HKLP NK KNIT CUF BLU (Patient Supply) ×2
GOWN ISOLATION REGULAR LARGE LEVEL 2 (Gown)
GOWN ISOLATION RGLR LG LVL 2 FLL BCK OVERHEAD THMB HK MEDLN SMS YELLOW (Gown) IMPLANT
GOWN ISOLATION XL HOOK LOOP NECK KNIT (Patient Supply) ×2 IMPLANT
JELLY LUB LF STRL H2O SOL NGRS TRNLU FLM (Patient Supply) IMPLANT
KIT ENDO W/ FOUR PACK BUTTONS (Kits) ×1
KIT ENDOSCOPIC COMPLIANCE ENDOKIT (Kits) ×1
KIT ENDOSCOPIC COMPLIANCE ENDOKIT ORCAPOD 4 1.1 OZ (Kits) ×1 IMPLANT
LINER SCT 1500CC THNWL MDVC FLX ADV LF (Suction) IMPLANT
MANIFOLD SCT 2 STD NPTN 2 LF NS 4 PORT (Filter) ×1
MANIFOLD SUCTION 2 STANDARD 4 PORT (Filter) ×1
MANIFOLD SUCTION 2 STANDARD 4 PORT NEPTUNE 2 WASTE MANAGEMENT SYSTEM (Filter) ×1 IMPLANT
SOL IRR 0.9% NACL 500ML PLS PR BTL ISTNC (Irrigation Solutions) ×1
SOLUTION IRRIGATION 0.9% SDM CHLORIDE 500ML PR BTTL ISOTONIC NONPRGNC (Irrigation Solutions) ×1 IMPLANT
SOLUTION IRRIGATION 0.9% SODIUM CHLORIDE (Irrigation Solutions) ×1
SOLUTION IV LACTATED RINGERS 1000 ML (IV Solutions)
SOLUTION IV LACTATED RINGERS 1000 ML PLASTIC CONTAINER (IV Solutions) IMPLANT
SOLUTION IV LR 1000ML VFLX LF PLS CNTNR (IV Solutions)
SPONGE GAUZE L4 IN X W4 IN 4 PLY HIGH (Sponge)
SPONGE GAUZE L4 IN X W4 IN 4 PLY NONWOVEN LINT FREE CURITY RAYON (Sponge) IMPLANT
SPONGE GZE RYN PLSTR CRTY 4X4IN LF NS 4 (Sponge)
SYRINGE 30 ML CONCENTRIC TIP GRADUATE (Syringes, Needles)
SYRINGE 30 ML CONCENTRIC TIP GRADUATE NONPYROGENIC DEHP FREE LOK (Syringes, Needles) IMPLANT
SYRINGE 50 ML GRADUATE NONPYROGENIC DEHP (Syringes, Needles) ×1
SYRINGE 50 ML GRADUATE NONPYROGENIC DEHP FREE PVC FREE BD MEDICAL (Syringes, Needles) ×1 IMPLANT
SYRINGE MED 10ML BD LF STRL ST DISP (Syringes, Needles) ×2 IMPLANT
SYRINGE MED 30ML LL LF STRL CONC TIP (Syringes, Needles)
SYRINGE MED 50ML LF STRL GRAD N-PYRG (Syringes, Needles) ×1
TRAP MCS PLS 80CC LF STRL SCR CAP VL (Procedure Accessories) ×1
TRAP MUCUS SCREW CAP VIAL TRANSPORT CUP (Procedure Accessories) ×1
TRAP MUCUS SCREW CAP VIAL TRANSPORT CUP IDENTIFICATION LABEL PLASTIC (Procedure Accessories) ×1 IMPLANT
VALVE BIOPSY SINGLE USE BIOPSY VALVE STERILE ULTRASONIC BRONCHOSCOPE (Disposable Instruments) ×1 IMPLANT
VALVE BIOPSY ULTRASONIC BRONCHOSCOPE (Disposable Instruments) ×1
VALVE BX LF STRL DISP US BSCP (Disposable Instruments) ×1
VALVE SCT LF STRL FLXB ATCH DISP BSCP (Disposable Instruments) ×1
VALVE SUCTION FLEXIBLE ATTACH (Disposable Instruments) ×1
VALVE SUCTION SINGLE USE SUCTION VALVE STERILE FLEXIBLE ATTACH (Disposable Instruments) ×1 IMPLANT

## 2019-05-22 NOTE — Discharge Instr - AVS First Page (Signed)
Reason for your Hospital Admission:  ***      Instructions for after your discharge:  ***    NOTHING TO EAT OR DRINK UNTIL 11:30 AM TODAY    Endoscopy Discharge Instructions  General Instructions:  1. Following sedation, your judgement, perception, and coordination are considered impaired. Even though you may feel awake and alert, you are considered legally intoxicated. Therefore, for the next 24 hours   Do not Drive    Do not operate appliances or equipment that requires reaction time (e.g. stove, electrical tools, machinery)   Do not sign legal documents or be involved in important decisions.   Do not smoke if alone   Do not drink alcoholic beverages   Go directly home and rest for several hours before resuming your routine activities.   It is highly recommended to have a responsible adult stay with you for the next 24 hours    2. Tenderness, swelling or pain may occur at the IV site where you received sedation. If you experience this, apply warm soaks to the area. Notify your physician if this persists.    Instructions Specific To Procedures - Report To Physician Any Of The Following:    Bronchoscopy   Do not eat or drink until 11:30 am today   1. At this time, try sips of water first. If you are able to do so without gagging or   coughing, you may eat and drink. If not, try again after 15 minutes.   2. Some blood streaked sputum is common in the first day or so after   bronchoscopy, but your MD should be called if greater than 2-3 tablespoons is   produced.   3. Call your MD if there is new or increased shortness of breath or new or   unusual chest discomfort or T> 101 deg F.        If you have questions or problems contact your MD immediately. If you need immediate attention, call your MD, 911 and/or go to nearest emergency room.

## 2019-05-22 NOTE — PACU (Addendum)
Pt arrives to stage 2  Family brought to room  Pain is under control, no resp. Distress.  Dr in to discuss today's findings.  Pt still NPO until 1130AM.  1040:  Patient states readiness to be discharged to home.  Patient states pain and nausea managed at this time.  Patient/family/Responsible adult verbalize understanding of written discharge instructions as reviewed with nursing staff, medications, plan of care for pain/nausea management, and activity at home and awareness of available resources to contact from home as necessary.    Plan to discharge to home with all belongings and printed paper instructions in discharge folder.

## 2019-05-22 NOTE — Transfer of Care (Signed)
Anesthesia Transfer of Care Note    Patient: Jacqueline Weber    Procedures performed: Procedure(s):  BRONCHOSCOPY w/ wash & brush    Anesthesia type: General LMA    Patient location:PACU    Last vitals:   Vitals:    05/22/19 0757   BP: (!) 214/92   Resp: 18   Temp: 36.3 C (97.4 F)   SpO2: 98%       Post pain: Patient not complaining of pain, continue current therapy      Mental Status:awake    Respiratory Function: tolerating nasal cannula    Cardiovascular: stable    Nausea/Vomiting: patient not complaining of nausea or vomiting    Hydration Status: adequate    Post assessment: no apparent anesthetic complications    Signed by: Marin Shutter [name]  05/22/19 9:41 AM

## 2019-05-22 NOTE — H&P (Signed)
PREOP H&P    Date Time: 05/22/19 9:09 AM  Patient Name: Jacqueline Weber, Jacqueline Weber   [id number]  Attending Physician: Lerry Liner, MD    Assessment:   Pre-Op Diagnosis Codes:     * Bronchiectasis with acute exacerbation [J47.1]  Plan:   Procedure(s):  BRONCHOSCOPY.    Informed consent per documented office discussion and/or consent form.    Problem List:     Patient Active Problem List    Diagnosis Date Noted    Osteopenia of multiple sites 10/30/2018    Bronchiectasis 06/21/2017    Diabetes mellitus     Chronic renal failure, stage 5 05/08/2017    Anemia in chronic kidney disease (CODE) 04/28/2017    Iron deficiency anemia, unspecified iron deficiency anemia type 04/28/2017    NSTEMI (non-ST elevated myocardial infarction) 06/08/2016    History of CVA (cerebrovascular accident)     Hyperlipidemia 03/11/2013    HTN (hypertension) urgency 03/09/2013     History of Present Illness:   Jacqueline Weber is a 68 y.o. female.  No change from chronic history recorded in office notes. Symptoms indications/procedure indications as per assessment section above.   Past Medical History:     Past Medical History:   Diagnosis Date    Anemia 10/02/2016    H/O ANEMIA - REFLECTED ON CURRENT LABS.    Asthma NA    Cataracts, bilateral     H/O CATARACTS - SURGERY DONE - STILL HAS SOME ISSUES.    Cerebrovascular accident 2014    H/O STROKE - WEAKNESS ON LEFT SIDE. NO NEUROLOGIST. USES CANE FOR BALANCE.    Chronic renal failure, stage 5 05/08/2017    Constipation     Coronary artery disease 06/2016    H/O QUADRUPLE BYPASS SURGERY - FOLLOWED BY  HEART.    Diabetes mellitus     Difficulty walking     WALKS WITH CANE. WILL USE WC IN HOSPITAL    End-stage renal disease 04/2017    DIALYSIS [facility] [place] MON-WED-FRI. 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     Hypertension     CONTROLLED WITH MEDS.    Myocardial infarction 06/2016    Osteopenia of multiple sites 10/30/2018    Pre-diabetes     Renal insufficiency     CHRONIC KIDNEY DISEASE - FOLLOWED BY DR. Kate Sable.    Shortness of breath     MODERATE EXERTION     Type 2 diabetes mellitus, controlled     AM FSBS 160-170. INSTRUCTED TO CONTROL DIET CLOSELY TONIGHT.  HAD BEEN OFF JANUVIA, RESTARTED 06/15/17. HA1C 9 06/15/17. MANAGED BY PCP.      Past Surgical History:     Past Surgical History:   Procedure Laterality Date    BRONCHOSCOPY, FIBEROPTIC N/A 06/21/2017    Procedure: BRONCHOSCOPY with brushing and wash;  Surgeon: Danae Chen, MD;  Location: [facility] ENDOSCOPY OR;  Service: Pulmonary;  Laterality: N/A;    CORONARY ARTERY BYPASS N/A 06/12/2016    Procedure: Coronary Artery Bypass x 4.;  Surgeon: Bjorn Loser, MD;  Location: [facility]ART [facility];  Service: Cardiothoracic;  Laterality: N/A;  LIMA to LDA  Vein to PDA  Vein to OM  Vein to Diagonal    EGD, COLONOSCOPY N/A 10/04/2016  EGD, COLONOSCOPY N/A 10/04/2016    Procedure: EGD w/ bx, COLONOSCOPY w/ bx, polypectomy;  Surgeon: Norton Blizzard, MD;  Location: [facility] ENDOSCOPY OR;  Service: Gastroenterology;  Laterality: N/A;    ENDOSCOPIC,VEIN HARVEST N/A 06/12/2016    Procedure: Endoscopic, Left Leg Greater Saphenous Vein Harvest;  Surgeon: Bjorn Loser, MD;  Location: [facility] HEART OR;  Service: Cardiothoracic;  Laterality: N/A;  Left Leg Incision (medially, groin to ankle) @ 0742  Vein out of leg @ 0826  Vein prep complete @ 0845  Total time = 63 minutes    FORMATION, UPPER EXTREMITY, A-V FISTULA Left 05/31/2017    Procedure: FORMATION, UPPER EXTREMITY, A-V FISTULA;  Surgeon: Cherene Altes, MD;  Location: [facility] MAIN OR;  Service: Vascular;  Laterality: Left;  LEFT ARM A-V FISTULA      REVISION, A-V FISTULA UPPER EXTREMITY Left 07/16/2017    Procedure: REVISION, A-V FISTULA UPPER EXTREMITY WITH PATCH ANGIOPLASTY;  Surgeon: Cherene Altes,  MD;  [facility];  Service: Vascular;  Laterality: Left;     Family History:     Family History   Problem Relation Age of Onset    No known problems Mother     No known problems Father      Social History:     Social History     Socioeconomic History    Marital status: Widowed     Spouse name: Not on file    Number of children: Not on file    Years of education: Not on file    Highest education level: Not on file   Occupational History    Not on file   Social Needs    Financial resource strain: Not on file    Food insecurity     Worry: Not on file     Inability: Not on file    Transportation needs     Medical: Not on file     Non-medical: Not on file   Tobacco Use    Smoking status: Never Smoker    Smokeless tobacco: Never Used   Substance and Sexual Activity    Alcohol use: No    Drug use: No    Sexual activity: Never   Lifestyle    Physical activity     Days per week: Not on file     Minutes per session: Not on file    Stress: Not on file   Relationships    Social connections     Talks on phone: Not on file     Gets together: Not on file     Attends religious service: Not on file     Active member of club or organization: Not on file     Attends meetings of clubs or organizations: Not on file     Relationship status: Not on file    Intimate partner violence     Fear of current or ex partner: Not on file     Emotionally abused: Not on file     Physically abused: Not on file     Forced sexual activity: Not on file   Other Topics Concern    Not on file   Social History Narrative    Not on file     Allergies:     Allergies   Allergen Reactions    Mosquito (Culex Pipiens) Allergy Skin Test Shortness Of Breath     And  Trouble  breathing    Shellfish Allergy Hives  Shellfish-Derived Products Hives     New  allergy  New  allergy    Lisinopril Hives and Rash     Patient cannot remember the exact nature of the allergy    Penicillins Rash and Swelling     numbness  Swelling,   numbness     Medications:     Current Discharge Medication List      CONTINUE these medications which have NOT CHANGED    Details   ADVAIR DISKUS 250-50 MCG/DOSE Aerosol Powder, Breath Activtivatede INHALE 1 DOSE BY MOUTH TWICE DAILY  Refills: 5      aspirin EC 325 MG EC tablet Take 1 tablet (325 mg total) by mouth daily.  Qty: 30 tablet, Refills: 0      bisoprolol (ZEBETA) 5 MG tablet Take 5 mg by mouth daily      carvedilol (COREG) 3.125 MG tablet Take 3.125 mg by mouth daily.          Cholecalciferol (VITAMIN D3) 2000 units Tab Take 4,000 IU by mouth daily.      ferrous sulfate 324 (65 FE) MG Tablet Delayed Response TAKE 1 TABLET BY MOUTH IN THE MORNING WITH BREAKFAST  Qty: 90 tablet, Refills: 3      fluticasone (FLONASE) 50 MCG/ACT nasal spray Use 2 spray(s) in each nostril once daily  Qty: 16 g, Refills: 5    Associated Diagnoses: Post-nasal drip; Seasonal allergies      glipiZIDE (GLUCOTROL) 10 MG tablet Take 1 tablet (10 mg total) by mouth 2 (two) times daily before meals  Qty: 180 tablet, Refills: 1      hydrALAZINE (APRESOLINE) 25 MG tablet Take 25 mg by mouth 2 (two) times daily      linaGLIPtin (TRADJENTA) 5 MG Tab Take 1 tablet (5 mg total) by mouth daily  Qty: 90 tablet, Refills: 1      montelukast (SINGULAIR) 10 MG tablet as needed.      Olopatadine HCl 0.2 % Solution INSTILL 1 DROP INTO AFFECTED EYE ONCE DAILY  Qty: 3 Bottle, Refills: 3    Comments: Please consider 90 day supplies to promote better adherence  Associated Diagnoses: Itchy eyes      ONE TOUCH ULTRA TEST test strip Check blood sugar 2 times daily  Qty: 200 each, Refills: 3      ONETOUCH DELICA LANCETS 34V Misc Check BS BID  Qty: 200 each, Refills: 5      pantoprazole (PROTONIX) 40 MG tablet TAKE 1 TABLET BY MOUTH IN THE MORNING BEFORE BREAKFAST  Qty: 90 tablet, Refills: 4      RENVELA 800 MG tablet 1,600 mg 3 (three) times daily with meals.          simvastatin (ZOCOR) 20 MG tablet Take 20 mg by mouth nightly.          Umeclidinium  Bromide (INCRUSE ELLIPTA IN) Inhale into the lungs daily          predniSONE 5 MG (21) Tablet Therapy Pack Take by mouth           Review of Systems:   No change from previous documentation  Physical Exam:     Vitals:    05/22/19 0757   BP: (!) 214/92   Resp: 18   Temp: 97.4 F (36.3 C)   SpO2: 98%     Chest:  Slightly coarse sounds ( after neb tx )  Heart:  Regular rhythm, no significant murmurs.  Abdomen:  Soft, nontender, no mass or hepatosplenomegaly.  Neurologic: Alert and oriented, no obvious focal motor defects.      Signed by: Lerry Liner

## 2019-05-22 NOTE — Anesthesia Postprocedure Evaluation (Addendum)
Anesthesia Post Evaluation    Patient: Jacqueline Weber    Procedure(s):  BRONCHOSCOPY w/ wash & brush    Anesthesia type: general    Last Vitals:   Vitals Value Taken Time   BP 144/66 05/22/19 0937   Temp 97 05/22/19 0941   Pulse 96 05/22/19 0940   Resp 19 05/22/19 0941   SpO2 97 % 05/22/19 0940   Vitals shown include unvalidated device data.    Anesthesia Post Evaluation:     Patient Evaluated: PACU  Patient Participation: complete - patient participated  Level of Consciousness: awake  Pain Score: 0  Pain Management: adequate    Airway Patency: patent    Anesthetic complications: No      PONV Status: none    Cardiovascular status: acceptable  Respiratory status: acceptable  Hydration status: acceptable        Anesthesia Qualified Clinical Data Registry 2018    PACU Reintubation  Did the Patient have general anesthesia with intubation: No        PONV Adult  Is the patient aged 63 or older: Yes  Did the patient receive recieve a general anesthestic: Yes  Does the patient have 3 or more risk factors for PONV? Yes  Did the patient receive anti-emetics from at least two classes of medications? No  Was there a medical reason for not administering anti-emetics? Yes    PONV Pediatric  Is the patient aged 45-17? No            PACU Transfer Checklist Protocol  Was the patient transferred to the PACU at the conclusion of surgery? Yes  Was a checklist or transfer protocol used? Yes    ICU Transfer Checklist Protocol  Was the patient transferred to the ICU at the conclusion of surgery? No      Post-op Pain Assessment Prior to Anesthesia Care End  Age >=18 and assessed for pain in PACU: Yes  Pacu pain score <7/10: Yes      Perioperative Mortality  Perioperative mortality prior to Anesthesia end time: No    Perioperative Cardiac Arrest  Did the patient have an unanticipated intraoperative cardiac arrest between anesthesia start time and anesthesia end time? No    Unplanned Admission to ICU  Did the patient have an unplanned  admission to the ICU (not initially anticipated at anesthesia start time)? No      Signed by: Marin Shutter So, 05/22/2019 9:41 AM

## 2019-05-23 ENCOUNTER — Encounter: Payer: Self-pay | Admitting: Critical Care Medicine

## 2019-05-24 LAB — PNEUMOCYSTIS JIROVECI, MOLECULAR DETECTION, PCR

## 2019-05-24 LAB — PNEUMOCYSTIS JIROVECII DNA, QUALITATIVE REAL-TIME PCR: Pneumocystis jirovecii DNA, Qualitative PCR: NEGATIVE

## 2019-05-26 ENCOUNTER — Other Ambulatory Visit (INDEPENDENT_AMBULATORY_CARE_PROVIDER_SITE_OTHER): Payer: Self-pay

## 2019-05-26 LAB — LAB USE ONLY - HISTORICAL NON-GYN MEDICAL CYTOLOGY

## 2019-05-26 NOTE — Progress Notes (Signed)
NN contacted patient and her daughter about insulin instruction.  Patient's daughter answered and reported they needed education about how to administer insulin.  NN, the patient and her daughter agreed to meet on Thursday, 05/29/19 @ 1:00 pm.  Encouraged them to call office/ NN for assistance with questions or concerns.    Theresa Mulligan, MSN, RN-BC   Nurse [facility]  [address], [address]  [place], [place] [address]  T [phone]

## 2019-05-28 ENCOUNTER — Other Ambulatory Visit (INDEPENDENT_AMBULATORY_CARE_PROVIDER_SITE_OTHER): Payer: Self-pay

## 2019-05-28 NOTE — Progress Notes (Signed)
NN contacted patient and daughter to conduct covid screening questions before teaching session scheduled 05/29/2019.  Daughter [name] answered.  NN asked the following questions to the patient and daughter [name].       COVID-19 Questionnaire:  Last updated: Jan 10, 2019    Fever: NO   Cough: NO   Dyspnea: NO  Fatigue: NO  Muscle or body aches: NO  Headache: NO  New loss of taste or smell: NO    Nausea or vomiting: NO  Diarrhea:  NO  Close contact with a COVID-19 person?: NO  Recently been tested for COVID-19?: Yes-pre-op test which was negative   Ever been diagnosed with COVID-19?: NO  Live in a group living residence (such as an assisted-living facility, nursing home, shelter, or dormitory)?: NO  Work in a healthcare facility?: NO        Patient and daughter have been pre- screened for Covid 19 and will meet with NN on 05/29/2019 @  1:00 pm for insulin instruction.      Theresa Mulligan, MSN, RN-BC   Nurse [facility]  [address], [address]  [place], [place] [address]  T [phone]

## 2019-05-29 ENCOUNTER — Other Ambulatory Visit (INDEPENDENT_AMBULATORY_CARE_PROVIDER_SITE_OTHER): Payer: Self-pay

## 2019-05-29 ENCOUNTER — Other Ambulatory Visit (INDEPENDENT_AMBULATORY_CARE_PROVIDER_SITE_OTHER): Payer: Self-pay | Admitting: "Endocrinology

## 2019-05-29 ENCOUNTER — Telehealth (INDEPENDENT_AMBULATORY_CARE_PROVIDER_SITE_OTHER): Payer: Self-pay

## 2019-05-29 MED ORDER — BD PEN NEEDLE MINI U/F 31G X 5 MM MISC: 2 refills | Status: AC

## 2019-05-29 MED ORDER — LANTUS SOLOSTAR 100 UNIT/ML SC SOPN
PEN_INJECTOR | SUBCUTANEOUS | 1 refills | Status: DC
Start: 2019-05-29 — End: 2019-05-30

## 2019-05-29 NOTE — Telephone Encounter (Signed)
Met with patient and daughter for insulin administration education. Daughter able to do a return demonstration with both insulin pen and vial.      Daughter asked about Tradjenta prescription . Spoke to [employer] is covered by insurance but will cost $462.00 for 30 days-thinks patient is in the donut hole with Medicare part D insurance.      Please advise.

## 2019-05-29 NOTE — Progress Notes (Signed)
NN met with the patient and daughter,[name] who provided translation to review insulin administration education.  Patient signed the waiver to use daughter as the interpreter.      NN reviewed the following instructions for insulin administration:    First pick the site to inject:  Sites to use: back of upper arms, stomach (around the navel), front and side of thighs and buttocks.  Stay 1 inch away from the last few injections.  Stay 2 inches away from the belly button and any scars.  Do not use sites that are bruised, tender, swollen or hard to touch.    1. Check insulin vial for type; expiration date; and appearance  2. Pull plunger back to pull air into syringe until tip of plunger is at the correct dosage amount  3. Push needle through rubber stopper on type of vial. Inject air. Then turn vial and syringe upside down and fill syringe with insulin to correct dosage.  4. Insert the needle straight into injection site.   5. Inject the dose. Hold for 10 seconds to make sure there's no leaking around the site.   6. Safely dispose syringe in disposal container.     In addition, NN reviewed instructions for use of an insulin pen.    1. Clean the skin with an alcohol pad.  Let the alcohol air dry.    2. Take the cover off the pen.  Check insulin vial for type; expiration date; and appearance.  3. Use alcohol to clean the end of the pen where the needle twists off.    4. Peel back the cover on the needle.  Screw the needle onto the pen. The needle should be snug but not too tight.  5. To clear air out of the pen: Remove cap from the needle. Turn the dose dial to 2 units. Hold the pen so the needle is up in the air. Push the end of the pen in the air to clear the air.  Watch the tip of the needle for a drop of insulin.  You may need to do this more than once to see the drop of insulin on the needle.   6. To set your dose, turn the dial clockwise until you see the number for the insulin dose.   7. Pick a fatty skin area to  inject the insulin.  8. Push the needle into the skin in a straight, quick motion.  Be sure the needle is all the way into the skin before you use the pen.  9. Using your thumb, push the end of the pen (called the button) down slowly until the dial reads zero.   10. Keep your thumb on the button.  Wait 10 seconds before removing the needle.    11. While still holding your thumb down, pull the needle out.   12. Remove the needle from the pen and put it in a needle disposal container.   13.  Put the cover back on the insulin pen.     Patient and daughter verbalized understanding.  Daughter reported that she will be the one who would be administering the insulin to the patient.  The daughter was able to teach back both the vial insulin and insulin pen to the NN. They had no further questions or concerns.    NN informed them that she would route a note to NP [facility] about the insulin prescription.

## 2019-05-29 NOTE — Telephone Encounter (Signed)
Thanks so much for your help.  Please start Lantus 10 units nightly.  Please continue to check blood sugar fasting and 2 hours after dinner.  Fasting target goal for patient 90-140;  2 hours after dinner 180s. Lantus rx and pen needle rx sent to pharmacy on file. Please follow-up in 6 weeks.  I will send request to the clinical staff to complete prior auth for the Tradjenta (due to ESRD tradjendta is the only safe DPP-4)

## 2019-05-30 ENCOUNTER — Other Ambulatory Visit (INDEPENDENT_AMBULATORY_CARE_PROVIDER_SITE_OTHER): Payer: Self-pay | Admitting: "Endocrinology

## 2019-05-30 ENCOUNTER — Telehealth (INDEPENDENT_AMBULATORY_CARE_PROVIDER_SITE_OTHER): Payer: Self-pay

## 2019-05-30 ENCOUNTER — Other Ambulatory Visit (INDEPENDENT_AMBULATORY_CARE_PROVIDER_SITE_OTHER): Payer: Self-pay

## 2019-05-30 MED ORDER — BASAGLAR KWIKPEN 100 UNIT/ML SC SOPN
10.00 [IU] | PEN_INJECTOR | Freq: Every evening | SUBCUTANEOUS | 1 refills | Status: DC
Start: 2019-05-30 — End: 2019-06-12

## 2019-05-30 NOTE — Progress Notes (Addendum)
NN called patient about a new prescription for Lantus.  There was no answer.  Left message for patient to return call for instructions with office contact information.  Pending call back.      Theresa Mulligan, MSN, RN-BC   Nurse [facility]  [address]  [place], [place] [address]  T [phone]    NN called daughter about medication change.  She answered.  NN provided daughter with instructions for Basaglar 10 units at bedtime, blood sugar goals and to schedule a 6 week follow up appointment.  She verbalized instructions.  She had no further concerns.  Encouraged to call office/NN for assistance with questions or concerns.     Theresa Mulligan, MSN, Futures trader

## 2019-05-30 NOTE — Telephone Encounter (Signed)
Basaglar rx sent.  Thanks!

## 2019-05-30 NOTE — Telephone Encounter (Signed)
Notified Nurse navigator, Pamala Hurry. She will notify patient of medication change.

## 2019-05-30 NOTE — Telephone Encounter (Signed)
NN spoke to daughter. NN provided instructions for  Basaglar 10 units @ bedtime,  blood sugar goals and to schedule a follow up appointment in 6 weeks. Daughter verbalized understanding.

## 2019-05-30 NOTE — Telephone Encounter (Signed)
Prescription for Lantus Solostar was submitted yesterday. According to covermymeds, "Lantus SoloStar 100UNIT/ML pen-injectors has been rejected by insurance."     Preferred alternatives that do not require prior auth are:  Engineer, agricultural KwikPen U-100 Insulin tier-2  Levemir FlexTouch U-100 Insuln tier-3  Tresiba FlexTouch U-200 tier-4  Levemir U-100 Insulin tier-5    Do you want to prescribe an alternative to Lantus Solostar? Or do you want to move forward with a prior auth?

## 2019-06-12 ENCOUNTER — Other Ambulatory Visit (INDEPENDENT_AMBULATORY_CARE_PROVIDER_SITE_OTHER): Payer: Self-pay

## 2019-06-12 NOTE — Telephone Encounter (Signed)
Spoke with patient's daughter, she stated that she went to pharmacy and the basaglar and tradjenta were not there. Please send to pharmacy again.

## 2019-06-13 MED ORDER — BASAGLAR KWIKPEN 100 UNIT/ML SC SOPN: 10.0000 [IU] | PEN_INJECTOR | Freq: Every evening | SUBCUTANEOUS | 1 refills | Status: AC

## 2019-06-13 MED ORDER — TRADJENTA 5 MG PO TABS
5.00 mg | ORAL_TABLET | Freq: Every day | ORAL | 1 refills | Status: AC
Start: 2019-06-13 — End: 2020-06-12

## 2019-06-24 NOTE — Progress Notes (Signed)
Subjective:      Date: 06/25/2019 5:09 PM   Patient ID: Jacqueline Weber is a 68 y.o. female.    Chief Complaint:  Chief Complaint   Patient presents with    Immunizations       HPI:  Hypertension  The patient is being seen for a routine follow-up of hypertension. Hypertension is classified as essential. Hypertensive end-organ manifestations include CKD-Stage 5 and CAD/MI. Comorbid illnesses include hyperlipidemia, diabetes, myocardial infarction and prior CVA. There is no interval history of chest pain.  Current therapy includes beta blockers and diuretics. Patient is compliant with the current regimen.   The patient's blood pressure response to medications is at goal. Elevated here but normal at dialysis    Hyperlipidemia  The patient is being seen for a routine follow-up of hyperlipidemia. Hyperlipidemia is classified as pure hypercholesterolemia. Comorbid illnesses include hypertension, diabetes, CAD and chronic kidney disease.  There is no interval history of myalgias and chest pain. Current therapy includes a statin.  Patient is compliant with the current regimen. The LDL is at goal.      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 30 tablet 0     bisoprolol (ZEBETA) 5 MG tablet Take 5 mg by mouth daily      carvedilol (COREG) 3.125 MG tablet Take 3.125 mg by mouth daily.          Cholecalciferol (VITAMIN D3) 2000 units Tab Take 4,000 IU by mouth daily.      ferrous sulfate 324 (65 FE) MG Tablet Delayed Response TAKE 1 TABLET BY MOUTH IN THE MORNING WITH BREAKFAST 90 tablet 3    fluticasone (FLONASE) 50 MCG/ACT nasal spray Use 2 spray(s) in each nostril once daily 16 g 5    glipiZIDE (GLUCOTROL) 10 MG tablet Take 1 tablet (10 mg total) by mouth 2 (two) times daily before meals 180 tablet 1    hydrALAZINE (APRESOLINE) 25 MG tablet Take 25 mg by mouth 2 (two) times daily      insulin glargine (BASAGLAR KWIKPEN) 100 UNIT/ML injection pen Inject 10 Units into the skin nightly 15 mL 1    Insulin Pen Needle (B-D UF III MINI PEN NEEDLES) 31G X 5 MM Misc Inject insulin nightly as instructed 100 each 2    linaGLIPtin (Tradjenta) 5 MG Tab Take 1 tablet (5 mg total) by mouth daily 90 tablet 1    montelukast (SINGULAIR) 10 MG tablet as needed.      Olopatadine HCl 0.2 % Solution INSTILL 1 DROP INTO AFFECTED EYE ONCE DAILY 3 Bottle 3    ONE TOUCH ULTRA TEST test  strip Check blood sugar 2 times daily 200 each 3    ONETOUCH DELICA LANCETS 54O Misc Check BS BID 200 each 5    pantoprazole (PROTONIX) 40 MG tablet TAKE 1 TABLET BY MOUTH IN THE MORNING BEFORE BREAKFAST 90 tablet 4    RENVELA 800 MG tablet 1,600 mg 3 (three) times daily with meals.          simvastatin (ZOCOR) 20 MG tablet Take 20 mg by mouth nightly.          Umeclidinium Bromide (INCRUSE ELLIPTA IN) Inhale into the lungs daily             Allergies:  Allergies   Allergen Reactions    Mosquito (Culex Pipiens) Allergy Skin Test Shortness Of Breath     And  Trouble  breathing    Shellfish Allergy Hives    Shellfish-Derived Products Hives     New  allergy  New  allergy    Lisinopril Hives and Rash     Patient cannot remember the exact nature of the allergy    Penicillins Rash and Swelling      numbness  Swelling,  numbness       Past Medical History:  Past Medical History:   Diagnosis Date    Anemia 10/02/2016    H/O ANEMIA - REFLECTED ON CURRENT LABS.    Asthma NA    Cataracts, bilateral     H/O CATARACTS - SURGERY DONE - STILL HAS SOME ISSUES.    Cerebrovascular accident 2014    H/O STROKE - WEAKNESS ON LEFT SIDE. NO NEUROLOGIST. USES CANE FOR BALANCE.    Chronic renal failure, stage 5 05/08/2017    Constipation     Coronary artery disease 06/2016    H/O QUADRUPLE BYPASS SURGERY - FOLLOWED BY  HEART.    Diabetes mellitus     Difficulty walking     WALKS WITH CANE. 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    Hypertension     CONTROLLED WITH MEDS.    Myocardial infarction 06/2016    Osteopenia of multiple sites 10/30/2018    Pre-diabetes     Renal insufficiency     CHRONIC KIDNEY DISEASE - FOLLOWED BY DR. Kate Sable.    Shortness of breath     MODERATE EXERTION     Type 2 diabetes mellitus, controlled     AM FSBS 160-170. INSTRUCTED TO CONTROL DIET CLOSELY TONIGHT.  HAD BEEN OFF JANUVIA, RESTARTED 06/15/17. HA1C 9 06/15/17. MANAGED BY PCP.        Past Surgical History:  Past Surgical History:   Procedure Laterality Date    BRONCHOSCOPY, FIBEROPTIC N/A 06/21/2017    Procedure: BRONCHOSCOPY with brushing and wash;  Surgeon: Danae Chen, MD;  Location: [facility];  Service: Pulmonary;  Laterality: N/A;    BRONCHOSCOPY, FIBEROPTIC N/A 05/22/2019    Procedure: BRONCHOSCOPY w/ wash & brush;  Surgeon: Lerry Liner, MD;  Location: [facility];  Service: Pulmonary;  Laterality: N/A;    CORONARY ARTERY BYPASS N/A 06/12/2016    Procedure: Coronary Artery Bypass x 4.;  Surgeon: Bjorn Loser, MD;  Location:  [facility];  Service: Cardiothoracic;  Laterality: N/A;  LIMA to LDA  Vein to PDA  Vein to OM  Vein to Diagonal    EGD, COLONOSCOPY N/A 10/04/2016    EGD, COLONOSCOPY N/A 10/04/2016    Procedure: EGD w/ bx, COLONOSCOPY w/ bx, polypectomy;  Surgeon: Norton Blizzard, MD;  Location: [facility];  Service: Gastroenterology;  Laterality: N/A;    ENDOSCOPIC,VEIN HARVEST N/A 06/12/2016    Procedure: Endoscopic, Left Leg Greater Saphenous Vein Harvest;  Surgeon: Bjorn Loser, MD;  Location: [facility];  Service: Cardiothoracic;  Laterality: N/A;  Left Leg Incision (medially, groin to ankle) @ 0742  Vein out of leg @ 0826  Vein prep complete @ 0845  Total time = 63 minutes    FORMATION, UPPER EXTREMITY, A-V FISTULA Left 05/31/2017    Procedure: FORMATION, UPPER EXTREMITY, A-V FISTULA;  Surgeon: Cherene Altes, MD;  Location: [facility];  Service: Vascular;  Laterality: Left;  LEFT ARM A-V FISTULA      REVISION, A-V FISTULA UPPER EXTREMITY Left 07/16/2017    Procedure: REVISION, A-V FISTULA UPPER EXTREMITY WITH PATCH ANGIOPLASTY;  Surgeon: Cherene Altes, MD;  Location: [facility];  Service: Vascular;  Laterality: Left;       Family History:  Family History   Problem Relation Age of Onset    No known problems Mother     No known problems Father        Social History:  Social History     Socioeconomic History    Marital status: Widowed     Spouse name: Not on file    Number of children: Not on file    Years of education: Not on file    Highest education level: Not on file   Occupational History    Not on file   Social Needs    Financial resource strain: Not on file    Food insecurity     Worry: Not on file     Inability: Not on file    Transportation needs     Medical: Not on file     Non-medical: Not on file   Tobacco Use    Smoking status: Never Smoker    Smokeless tobacco: Never Used   Substance and Sexual Activity    Alcohol use: No    Drug use: No    Sexual  activity: Never   Lifestyle    Physical activity     Days per week: Not on file     Minutes per session: Not on file    Stress: Not on file   Relationships    Social connections     Talks on phone: Not on file     Gets together: Not on file     Attends religious service: Not on file     Active member of club or organization: Not on file     Attends meetings of clubs or organizations: Not on file     Relationship status: Not on file    Intimate partner violence     Fear of current or ex partner: Not on file     Emotionally abused: Not on file     Physically abused: Not on file     Forced sexual activity: Not on file   Other Topics Concern    Not on file   Social History Narrative    Not on file  The following sections were reviewed this encounter by the provider:   Tobacco   Allergies   Meds   Problems   Med Hx   Surg Hx   Fam Hx          Vitals:  BP 177/67 (BP Site: Right arm, Patient Position: Sitting, Cuff Size: Medium)    Pulse 65    Temp 97.8 F (36.6 C) (Temporal)    Resp 14    Wt 70.8 kg (156 lb)    SpO2 95%    BMI 30.47 kg/m          ROS:  Review of Systems   Constitutional: Negative for chills, fatigue and fever.   Respiratory: Positive for cough (chronic). Negative for shortness of breath and wheezing.    Cardiovascular: Negative for chest pain and palpitations.   Gastrointestinal: Negative for abdominal pain, diarrhea, nausea and vomiting.   Genitourinary: Decreased urine volume:   pt is moving to [place] next week.   Musculoskeletal: Negative for myalgias.   Neurological: Negative for weakness, numbness and headaches.         Objective:       Physical Exam:  General Examination:   GENERAL APPEARANCE: alert, in no acute distress, well developed, well nourished, oriented to time, place, and person.   HEAD: normal appearance, atraumatic.   EYES: extraocular movement intact (EOMI), pupils equal, round, reactive to light and accommodation, sclera anicteric, conjunctiva clear. [name]   NECK/THYROID: neck  supple,  no cervical lymphadenopathy, no neck mass palpated, no thyromegaly.   LYMPH NODES: no palpable adenopathy.   SKIN: good turgor, no rashes, no suspicious lesions.   HEART: S1, S2 normal, no murmurs, rubs, gallops, regular rate and rhythm.   LUNGS: normal effort / no distress, scattered rales  MUSCULOSKELETAL: full range of motion, no swelling or deformity.   EXTREMITIES: no edem  NEUROLOGIC: nonfocal, cranial nerves 2-12 grossly intact   PSYCH: cognitive function intact, mood/affect full range, speech clear.      Assessment/Plan:       1. Essential hypertension  Above goal here but normal after dialysis, will not changes meds today    2. Pure hypercholesterolemia  At goal, continue current meds    3. Need for vaccination  - Flu Vaccine Recombinant Quadrivalent 18 yrs & up PRESERVATIVE FREE (Flublok)  - Pneumococcal polysaccharide vaccine 23-valent greater than or equal to 2yo subcutaneous/IM          Nilda Calamity, MD

## 2019-06-25 ENCOUNTER — Encounter (INDEPENDENT_AMBULATORY_CARE_PROVIDER_SITE_OTHER): Payer: Self-pay | Admitting: Internal Medicine

## 2019-06-25 ENCOUNTER — Ambulatory Visit (INDEPENDENT_AMBULATORY_CARE_PROVIDER_SITE_OTHER): Payer: Medicare Other | Admitting: Internal Medicine

## 2019-06-25 VITALS — BP 177/67 | HR 65 | Temp 97.8°F | Resp 14 | Wt 156.0 lb

## 2019-06-25 DIAGNOSIS — I10 Essential (primary) hypertension: Secondary | ICD-10-CM

## 2019-06-25 DIAGNOSIS — E7800 Pure hypercholesterolemia, unspecified: Secondary | ICD-10-CM

## 2019-06-25 DIAGNOSIS — Z23 Encounter for immunization: Secondary | ICD-10-CM

## 2019-06-25 NOTE — Progress Notes (Signed)
Have you seen any specialists/other providers since your last visit with us?    Yes    Arm preference verified?   Yes    The patient is due for influenza vaccine

## 2019-06-27 ENCOUNTER — Telehealth (INDEPENDENT_AMBULATORY_CARE_PROVIDER_SITE_OTHER): Payer: Self-pay | Admitting: Internal Medicine

## 2019-06-27 DIAGNOSIS — E1122 Type 2 diabetes mellitus with diabetic chronic kidney disease: Secondary | ICD-10-CM

## 2019-06-27 NOTE — Telephone Encounter (Signed)
Pt is requesting referral for provider in [place] in regards to a dialysis she will be having there. Provider in [place] is in need of referral for a chest x-ray for her dialysis.     If order is approved please fax to: [phone]    If any further questions or details please call pt preferred number at: [phone]    Location name of dialysis given by pt daughter:  [facility] in [facility]  Phone number: [phone]

## 2019-07-01 NOTE — Telephone Encounter (Signed)
Pt's daughter called in regards to the previous message stating she needs this done ASAP. She would like a call back at [phone] once this has been resolved Please advise, thank you!

## 2019-07-02 NOTE — Telephone Encounter (Signed)
Please advise 

## 2019-07-02 NOTE — Telephone Encounter (Signed)
Please fax referral for xray

## 2019-07-02 NOTE — Telephone Encounter (Signed)
Order faxed to number provided

## 2019-07-03 ENCOUNTER — Other Ambulatory Visit: Payer: Self-pay | Admitting: Internal Medicine

## 2019-07-18 ENCOUNTER — Other Ambulatory Visit (INDEPENDENT_AMBULATORY_CARE_PROVIDER_SITE_OTHER): Payer: Self-pay | Admitting: "Endocrinology

## 2019-07-22 LAB — FUNGAL IDENTIFICATION

## 2019-08-15 ENCOUNTER — Encounter (INDEPENDENT_AMBULATORY_CARE_PROVIDER_SITE_OTHER): Payer: Self-pay | Admitting: "Endocrinology

## 2019-08-21 DIAGNOSIS — Z951 Presence of aortocoronary bypass graft: Secondary | ICD-10-CM | POA: Insufficient documentation

## 2019-08-21 DIAGNOSIS — K219 Gastro-esophageal reflux disease without esophagitis: Secondary | ICD-10-CM | POA: Diagnosis present

## 2019-08-21 DIAGNOSIS — N186 End stage renal disease: Secondary | ICD-10-CM | POA: Insufficient documentation

## 2019-08-22 ENCOUNTER — Encounter (INDEPENDENT_AMBULATORY_CARE_PROVIDER_SITE_OTHER): Payer: Self-pay | Admitting: Internal Medicine

## 2019-09-03 ENCOUNTER — Encounter (INDEPENDENT_AMBULATORY_CARE_PROVIDER_SITE_OTHER): Payer: Self-pay | Admitting: Internal Medicine

## 2019-11-07 ENCOUNTER — Other Ambulatory Visit (INDEPENDENT_AMBULATORY_CARE_PROVIDER_SITE_OTHER): Payer: Self-pay | Admitting: Internal Medicine

## 2020-10-21 ENCOUNTER — Other Ambulatory Visit: Payer: Self-pay

## 2020-10-21 ENCOUNTER — Ambulatory Visit: Payer: Medicare HMO | Admitting: Physical Therapy

## 2020-11-04 ENCOUNTER — Other Ambulatory Visit: Payer: Self-pay

## 2020-11-04 ENCOUNTER — Ambulatory Visit: Payer: Medicare HMO | Attending: Nephrology | Admitting: Physical Therapy

## 2020-11-04 ENCOUNTER — Encounter: Payer: Self-pay | Admitting: Physical Therapy

## 2020-11-04 DIAGNOSIS — R2689 Other abnormalities of gait and mobility: Secondary | ICD-10-CM | POA: Insufficient documentation

## 2020-11-04 DIAGNOSIS — M6281 Muscle weakness (generalized): Secondary | ICD-10-CM | POA: Diagnosis present

## 2020-11-04 DIAGNOSIS — R262 Difficulty in walking, not elsewhere classified: Secondary | ICD-10-CM | POA: Diagnosis present

## 2020-11-04 DIAGNOSIS — R296 Repeated falls: Secondary | ICD-10-CM | POA: Diagnosis not present

## 2020-11-04 NOTE — Therapy (Signed)
[address] [place], [place], [address] Phone: [phone]   Fax:  [phone]  Physical Therapy Evaluation  Patient Details  Name: Shawna Lowe MRN: [id number] Date of Birth: [birth year] Referring Provider (PT): Moshe Cipro   Encounter Date: 11/04/2020   PT End of Session - 11/04/20 0928    Visit Number 1    Date for PT Re-Evaluation 01/04/21    PT Start Time 0846    PT Stop Time 0924    PT Time Calculation (min) 38 min    Activity Tolerance Patient tolerated treatment well;Patient limited by fatigue    Behavior During Therapy [facility] for tasks assessed/performed           History reviewed. No pertinent past medical history.  History reviewed. No pertinent surgical history.  There were no vitals filed for this visit.    Subjective Assessment - 11/04/20 0853    Subjective Pt reports to clinic for strengthening, conditioning, and balance training to prepare for kidney transplant. Pt attends dialysis Mon/Wed/Fri. Daughter transports pt to all appts. Pt daughter reports hx of stroke affecting L side in 2016 and states she has some residual weakness along with occasional blurred vision. Pt did not pass testing for kidney transplant; was not told specific parameters but was told she needs to have increased strength and improved gait/balance. Pt daughter reports she has had one fall in the past six months which occurred when she was trying to sit down on the edge of the bed; missed and fell to the floor. Pt would like to be able to garden, cook, and clean again; daughter states she has not been able to do much around the house d/t weakness/endurance deficits.    Patient is accompained by: Interpreter;Family member    Pertinent History hx of CVA with L hemiparesis (2016), DM, HTN, asthma, ESRD    Limitations Standing;Walking;House hold activities    Patient Stated Goals get stronger to be able to do more around the house  and to be eligible for kidney transplant    Currently in Pain? No/denies              [facility] PT Assessment - 11/04/20 0001      Assessment   Medical Diagnosis ESRD    Referring Provider (PT) Moshe Cipro    Next MD Visit --   will call back for follow up     Balance Screen   Has the patient fallen in the past 6 months Yes    How many times? 1    Has the patient had a decrease in activity level because of a fear of falling?  No    Is the patient reluctant to leave their home because of a fear of falling?  No      Home Environment   Additional Comments 3 steps to enter; stairsinside house to 2nd level; reports no trouble with stairs      Prior Function   Level of Independence Independent    Vocation Retired    Leisure gardening, cooking, Artist Intact      Functional Tests   Functional tests Sit to Stand      Sit to Stand   Comments difficulty with eccentric control; pushing legs against table to stand; cues for decreased use of UEs      ROM / Strength   AROM / PROM / Strength Strength  Strength   Overall Strength Comments LLE weakness    Strength Assessment Site Hip;Knee    Right/Left Hip Right;Left    Right Hip Flexion 4+/5    Right Hip Extension 4/5    Right Hip ABduction 4/5    Left Hip Flexion 4/5    Left Hip Extension 4-/5    Left Hip ABduction 3+/5    Right/Left Knee Right;Left    Right Knee Flexion 5/5    Right Knee Extension 5/5    Left Knee Flexion 4+/5    Left Knee Extension 4+/5      Flexibility   Soft Tissue Assessment /Muscle Length yes    Hamstrings tight      Transfers   Five time sit to stand comments  17.24 difficulty with eccentric control      Ambulation/Gait   Gait Comments gait with SPC and no AD around clinic; weaving and widened BOS, occasional LOB with turns. Decreased hip/knee flexion with gait      Standardized Balance Assessment   Standardized Balance Assessment Berg Balance  Test;Timed Up and Go Test      Berg Balance Test   Sit to Stand Able to stand without using hands and stabilize independently    Standing Unsupported Able to stand 2 minutes with supervision    Sitting with Back Unsupported but Feet Supported on Floor or Stool Able to sit safely and securely 2 minutes    Stand to Sit Sits safely with minimal use of hands    Transfers Able to transfer safely, definite need of hands    Standing Unsupported with Eyes Closed Able to stand 10 seconds safely    Standing Unsupported with Feet Together Able to place feet together independently and stand for 1 minute with supervision    From Standing, Reach Forward with Outstretched Arm Can reach forward >12 cm safely (5")    From Standing Position, Pick up Object from Floor Able to pick up shoe, needs supervision    From Standing Position, Turn to Look Behind Over each Shoulder Turn sideways only but maintains balance    Turn 360 Degrees Able to turn 360 degrees safely but slowly    Standing Unsupported, Alternately Place Feet on Step/Stool Able to stand independently and complete 8 steps >20 seconds    Standing Unsupported, One Foot in Front Able to take small step independently and hold 30 seconds    Standing on One Leg Tries to lift leg/unable to hold 3 seconds but remains standing independently    Total Score 41      Timed Up and Go Test   Normal TUG (seconds) 19.08    TUG Comments some swerving; difficulty maintaining balance with turns                      Objective measurements completed on examination: See above findings.               PT Education - 11/04/20 [phone]    Education Details Pt educated on POC and HEP    Person(s) Educated Patient    Methods Explanation;Demonstration;Handout    Comprehension Verbalized understanding;Returned demonstration            PT Short Term Goals - 11/04/20 1119      PT SHORT TERM GOAL #1   Title Pt will be I with initial HEP    Time 2     Period Weeks    Status New    Target Date  11/18/20             PT Long Term Goals - 11/04/20 1121      PT LONG TERM GOAL #1   Title Pt will demo TUG <15 sec with LRAD and gait WFL    Baseline 19.08 sec with leaning on wall when turning d/t LOB with turn    Time 8    Period Weeks    Status New    Target Date 12/30/20      PT LONG TERM GOAL #2   Title Pt will demo gait x100 ft around clinic with LRAD and gait WFL    Baseline weaving and occasional LOB with gait    Time 8    Period Weeks    Status New    Target Date 12/30/20      PT LONG TERM GOAL #3   Title Pt will report no additional falls    Time 8    Period Weeks    Status New    Target Date 12/30/20      PT LONG TERM GOAL #4   Title Pt will demo Berg Balance score improved to 52/56    Time 8    Period Weeks    Status New    Target Date 12/30/20                  Plan - 11/04/20 0932    Clinical Impression Statement Pt presents to clinic with deconditioning, LE weakness, and balance deficits secondary to ESRD. Pt demos some residual deficits with strength LLE d/t prior CVA (2016). Ambulates into clinic with SPC, weaving with gait, and occasional LOB with turns. Berg Balance testing indicated pt at signficiant risk for falls. Pt daughter reports that pt has occasional blurred vision since CVA. Pt has eccentric weakness with 5x STS, decreased safety awareness with TUG. Pt O2 levels remained consistent >98% throughout eval. Pt requires strengthening, balance ex's, and conditioning to qualify to receive kidney transplant. Be aware with scheduling pt receives dialysis Mon/Wed/Fri.    Personal Factors and Comorbidities Comorbidity 3+    Comorbidities HTN, DM, asthma, ESRD    Examination-Participation Restrictions Community Activity;Interpersonal Relationship;Meal Prep;Cleaning    Stability/Clinical Decision Making Evolving/Moderate complexity    Clinical Decision Making Moderate    Rehab Potential Good    PT  Frequency 2x / week    PT Duration 8 weeks    PT Treatment/Interventions ADLs/Self Care Home Management;Neuromuscular re-education;Balance training;Therapeutic exercise;Therapeutic activities;Functional mobility training;Gait training;Stair training;Patient/family education    PT Next Visit Plan balance ex's, endurance, gait training    PT Home Exercise Plan see pt instructions    Consulted and Agree with Plan of Care Patient           Patient will benefit from skilled therapeutic intervention in order to improve the following deficits and impairments:  Abnormal gait,Decreased range of motion,Difficulty walking,Decreased endurance,Decreased safety awareness,Decreased activity tolerance,Decreased balance,Decreased mobility,Decreased strength,Postural dysfunction  Visit Diagnosis: Repeated falls  Other abnormalities of gait and mobility  Muscle weakness (generalized)  Difficulty in walking, not elsewhere classified     Problem List There are no problems to display for this patient.  Amador Cunas, PT, DPT Donald Prose Ellayna Hilligoss 11/04/2020, 11:29 AM  [address] [place], [place], [address] Phone: [phone]   Fax:  [phone]  Name: Shawna Lowe MRN: [id number] Date of Birth: October 07, 1950

## 2020-11-04 NOTE — Patient Instructions (Signed)
Access Code: [email or web address] URL: [email or web address] Date: 11/04/2020 Prepared by: Amador Cunas  Exercises Sit to Stand with Counter Support - 1 x daily - 7 x weekly - 3 sets - 10 reps Seated Long Arc Quad - 1 x daily - 7 x weekly - 3 sets - 10 reps Seated March - 1 x daily - 7 x weekly - 3 sets - 10 reps Seated Heel Raise - 1 x daily - 7 x weekly - 3 sets - 10 reps

## 2020-11-11 ENCOUNTER — Ambulatory Visit: Payer: Medicare HMO

## 2020-11-11 ENCOUNTER — Other Ambulatory Visit: Payer: Self-pay

## 2020-11-11 DIAGNOSIS — R296 Repeated falls: Secondary | ICD-10-CM

## 2020-11-11 DIAGNOSIS — R262 Difficulty in walking, not elsewhere classified: Secondary | ICD-10-CM

## 2020-11-11 DIAGNOSIS — R2689 Other abnormalities of gait and mobility: Secondary | ICD-10-CM

## 2020-11-11 DIAGNOSIS — M6281 Muscle weakness (generalized): Secondary | ICD-10-CM

## 2020-11-11 NOTE — Therapy (Signed)
[place]. [place], [place], [address] Phone: [phone]   Fax:  [phone]  Physical Therapy Treatment  Patient Details  Name: Shawna Lowe MRN: [id number] Date of Birth: 1951/06/29 Referring Provider (PT): Moshe Cipro   Encounter Date: 11/11/2020   PT End of Session - 11/11/20 0937    Visit Number 2    Date for PT Re-Evaluation 01/04/21    PT Start Time 0935    PT Stop Time 1015    PT Time Calculation (min) 40 min    Equipment Utilized During Treatment Gait belt    Activity Tolerance Patient tolerated treatment well;Patient limited by fatigue    Behavior During Therapy [facility] for tasks assessed/performed           No past medical history on file.  No past surgical history on file.  There were no vitals filed for this visit.   Subjective Assessment - 11/11/20 0938    Subjective Pt's daughter says pt is doing her HEP. 68 year old granddaughter helps her with it.    Patient is accompained by: Interpreter;Family member    Pertinent History hx of CVA with L hemiparesis (2016), DM, HTN, asthma, ESRD    Limitations Standing;Walking;House hold activities    Patient Stated Goals get stronger to be able to do more around the house and to be eligible for kidney transplant    Currently in Pain? No/denies                             [facility] Adult PT Treatment/Exercise - 11/11/20 0001      Neuro Re-ed    Neuro Re-ed Details  Lateral stepping on airex beam x4 laps with MIN A   standing rest break taken     Exercises   Exercises Knee/Hip      Knee/Hip Exercises: Aerobic   Recumbent Bike attempted, but foot kept coming out of pedal despite tightening    Nustep L5 UE/LE 5 min   arms #8     Knee/Hip Exercises: Seated   Long Arc Quad Both;2 sets;10 reps;Weights    Long Arc Quad Weight 3 lbs.    Marching 2 sets;10 reps    Marching Weights 3 lbs.    Sit to Sand 2 sets;10 reps;without UE  support   added 3# blue ball to 2nd set, slow eccentric                   PT Short Term Goals - 11/04/20 1119      PT SHORT TERM GOAL #1   Title Pt will be I with initial HEP    Time 2    Period Weeks    Status New    Target Date 11/18/20             PT Long Term Goals - 11/04/20 1121      PT LONG TERM GOAL #1   Title Pt will demo TUG <15 sec with LRAD and gait WFL    Baseline 19.08 sec with leaning on wall when turning d/t LOB with turn    Time 8    Period Weeks    Status New    Target Date 12/30/20      PT LONG TERM GOAL #2   Title Pt will demo gait x100 ft around clinic with LRAD and gait WFL    Baseline weaving and occasional LOB with gait  Time 8    Period Weeks    Status New    Target Date 12/30/20      PT LONG TERM GOAL #3   Title Pt will report no additional falls    Time 8    Period Weeks    Status New    Target Date 12/30/20      PT LONG TERM GOAL #4   Title Pt will demo Berg Balance score improved to 52/56    Time 8    Period Weeks    Status New    Target Date 12/30/20                 Plan - 11/11/20 1246    Clinical Impression Statement Pt presents with interpreter Simmaly and daughter. Pt tolerated tx well, focused on LE strengthening and balance. She carries SPC with her, but does not use it. MIN A required for lateral stepping on airex beam, with interpreter providing light HHA anterior to pt. Will continue strength and balance progressions.    Personal Factors and Comorbidities Comorbidity 3+    Comorbidities HTN, DM, asthma, ESRD    Examination-Participation Restrictions Community Activity;Interpersonal Relationship;Meal Prep;Cleaning    Stability/Clinical Decision Making Evolving/Moderate complexity    Rehab Potential Good    PT Frequency 2x / week    PT Duration 8 weeks    PT Treatment/Interventions ADLs/Self Care Home Management;Neuromuscular re-education;Balance training;Therapeutic exercise;Therapeutic  activities;Functional mobility training;Gait training;Stair training;Patient/family education    PT Next Visit Plan balance ex's, endurance, gait training    PT Home Exercise Plan [id number]    Consulted and Agree with Plan of Care Patient           Patient will benefit from skilled therapeutic intervention in order to improve the following deficits and impairments:  Abnormal gait,Decreased range of motion,Difficulty walking,Decreased endurance,Decreased safety awareness,Decreased activity tolerance,Decreased balance,Decreased mobility,Decreased strength,Postural dysfunction  Visit Diagnosis: Repeated falls  Other abnormalities of gait and mobility  Muscle weakness (generalized)  Difficulty in walking, not elsewhere classified     Problem List There are no problems to display for this patient.   Izell [place], PT, DPT 11/11/2020, 12:50 PM  [place]. [place], [place], [address] Phone: [phone]   Fax:  [phone]  Name: Shawna Lowe MRN: [id number] Date of Birth: 05-08-1951

## 2020-11-16 ENCOUNTER — Ambulatory Visit: Payer: Medicare HMO | Admitting: Physical Therapy

## 2020-11-16 ENCOUNTER — Other Ambulatory Visit: Payer: Self-pay

## 2020-11-16 ENCOUNTER — Encounter: Payer: Self-pay | Admitting: Physical Therapy

## 2020-11-16 DIAGNOSIS — R2689 Other abnormalities of gait and mobility: Secondary | ICD-10-CM

## 2020-11-16 DIAGNOSIS — R296 Repeated falls: Secondary | ICD-10-CM | POA: Diagnosis not present

## 2020-11-16 DIAGNOSIS — R262 Difficulty in walking, not elsewhere classified: Secondary | ICD-10-CM

## 2020-11-16 DIAGNOSIS — M6281 Muscle weakness (generalized): Secondary | ICD-10-CM

## 2020-11-16 NOTE — Therapy (Signed)
[place]. [place], [place], [address] Phone: [phone]   Fax:  [phone]  Physical Therapy Treatment  Patient Details  Name: Shawna Lowe MRN: [id number] Date of Birth: 08/19/1951 Referring Provider (PT): Moshe Cipro   Encounter Date: 11/16/2020   PT End of Session - 11/16/20 1010    Visit Number 3    Date for PT Re-Evaluation 01/04/21    PT Start Time 0930    PT Stop Time 1010    PT Time Calculation (min) 40 min    Activity Tolerance Patient tolerated treatment well;Patient limited by fatigue    Behavior During Therapy [facility] for tasks assessed/performed           History reviewed. No pertinent past medical history.  History reviewed. No pertinent surgical history.  There were no vitals filed for this visit.   Subjective Assessment - 11/16/20 0926    Subjective Pt reports no new changes since last rx    Currently in Pain? No/denies                             [facility] Adult PT Treatment/Exercise - 11/16/20 0001      High Level Balance   High Level Balance Activities Side stepping;Backward walking;Marching forwards;Marching backwards    High Level Balance Comments close supervision. CGA with backwards walking and backwards marching      Knee/Hip Exercises: Aerobic   Nustep L5 LE/UE x 6 min      Knee/Hip Exercises: Standing   Heel Raises Both;1 set;10 reps    Heel Raises Limitations 3#    Hip Abduction Both;1 set;10 reps    Abduction Limitations 3#    Hip Extension Both;1 set;10 reps    Extension Limitations 3#    Lateral Step Up Both;1 set;10 reps;Step Height: 6";Hand Hold: 2    Other Standing Knee Exercises alt step taps 6" stair no HR x10 B close sup-CGA      Knee/Hip Exercises: Seated   Long Arc Quad Both;2 sets;10 reps;Weights    Long Arc Quad Weight 3 lbs.    Marching 2 sets;10 reps    Marching Weights 3 lbs.    Hamstring Curl Both;2 sets;10 reps    Hamstring  Limitations red tb    Sit to Sand 2 sets;10 reps;without UE support   2nd set on airex; both sets with 3# dumbbell                   PT Short Term Goals - 11/04/20 1119      PT SHORT TERM GOAL #1   Title Pt will be I with initial HEP    Time 2    Period Weeks    Status New    Target Date 11/18/20             PT Long Term Goals - 11/04/20 1121      PT LONG TERM GOAL #1   Title Pt will demo TUG <15 sec with LRAD and gait WFL    Baseline 19.08 sec with leaning on wall when turning d/t LOB with turn    Time 8    Period Weeks    Status New    Target Date 12/30/20      PT LONG TERM GOAL #2   Title Pt will demo gait x100 ft around clinic with LRAD and gait WFL    Baseline weaving and occasional LOB with  gait    Time 8    Period Weeks    Status New    Target Date 12/30/20      PT LONG TERM GOAL #3   Title Pt will report no additional falls    Time 8    Period Weeks    Status New    Target Date 12/30/20      PT LONG TERM GOAL #4   Title Pt will demo Berg Balance score improved to 52/56    Time 8    Period Weeks    Status New    Target Date 12/30/20                 Plan - 11/16/20 1011    Clinical Impression Statement Pt tolerated progression of TE well. Able to tolerate more standing ex's this rx without needing rest break. Close supervision for most balance ex's. CGA for backwards walking and backwards marching. Cues to avoid compensations with standing hip ex's. CGA for alt step taps with no HR. Continue strength and balance progression.    PT Treatment/Interventions ADLs/Self Care Home Management;Neuromuscular re-education;Balance training;Therapeutic exercise;Therapeutic activities;Functional mobility training;Gait training;Stair training;Patient/family education    PT Next Visit Plan balance ex's, endurance, gait training    Consulted and Agree with Plan of Care Patient           Patient will benefit from skilled therapeutic intervention in  order to improve the following deficits and impairments:  Abnormal gait,Decreased range of motion,Difficulty walking,Decreased endurance,Decreased safety awareness,Decreased activity tolerance,Decreased balance,Decreased mobility,Decreased strength,Postural dysfunction  Visit Diagnosis: Repeated falls  Other abnormalities of gait and mobility  Muscle weakness (generalized)  Difficulty in walking, not elsewhere classified     Problem List There are no problems to display for this patient.  Amador Cunas, PT, DPT Donald Prose Naheem Mosco 11/16/2020, 10:13 AM  [place]. [place], [place], [address] Phone: [phone]   Fax:  [phone]  Name: Shawna Lowe MRN: [id number] Date of Birth: 1950-09-30

## 2020-11-18 ENCOUNTER — Other Ambulatory Visit: Payer: Self-pay

## 2020-11-18 ENCOUNTER — Ambulatory Visit: Payer: Medicare HMO

## 2020-11-18 DIAGNOSIS — M6281 Muscle weakness (generalized): Secondary | ICD-10-CM

## 2020-11-18 DIAGNOSIS — R2689 Other abnormalities of gait and mobility: Secondary | ICD-10-CM

## 2020-11-18 DIAGNOSIS — R262 Difficulty in walking, not elsewhere classified: Secondary | ICD-10-CM

## 2020-11-18 DIAGNOSIS — R296 Repeated falls: Secondary | ICD-10-CM | POA: Diagnosis not present

## 2020-11-18 NOTE — Therapy (Signed)
[place]. [place], [place], [address] Phone: [phone]   Fax:  [phone]  Physical Therapy Treatment  Patient Details  Name: Shawna Lowe MRN: [id number] Date of Birth: [birth year] Referring Provider (PT): Moshe Cipro   Encounter Date: 11/18/2020   PT End of Session - 11/18/20 0936    Visit Number 4    Date for PT Re-Evaluation 01/04/21    PT Start Time 0930    PT Stop Time 1012    PT Time Calculation (min) 42 min    Activity Tolerance Patient tolerated treatment well;Patient limited by fatigue    Behavior During Therapy [facility] for tasks assessed/performed           History reviewed. No pertinent past medical history.  History reviewed. No pertinent surgical history.  There were no vitals filed for this visit.   Subjective Assessment - 11/18/20 0935    Subjective Pt's daughter's says she is feeling better with exercise.    Patient is accompained by: Interpreter;Family member   [name], daughter   Currently in Pain? No/denies                             [facility] Adult PT Treatment/Exercise - 11/18/20 0001      Knee/Hip Exercises: Aerobic   Nustep L5 LE/UE x 6 min      Knee/Hip Exercises: Standing   Heel Raises Both;2 sets   12 reps   Heel Raises Limitations 3#    Knee Flexion Both;1 set   12 reps   Knee Flexion Limitations 3#    Hip Abduction Both;1 set;10 reps    Abduction Limitations 3#    Hip Extension Both;1 set;10 reps    Extension Limitations 3#    Lateral Step Up Both;1 set;10 reps;Step Height: 6";Hand Hold: 2    Other Standing Knee Exercises alt step taps 4" stair no HR x10 B close sup-CGA   3#     Knee/Hip Exercises: Seated   Long Arc Quad Both;2 sets;10 reps;Weights    Long Arc Quad Weight 3 lbs.    Sit to Sand 2 sets;10 reps;without UE support   3# dumbbell                   PT Short Term Goals - 11/04/20 1119      PT SHORT TERM GOAL #1   Title Pt  will be I with initial HEP    Time 2    Period Weeks    Status New    Target Date 11/18/20             PT Long Term Goals - 11/04/20 1121      PT LONG TERM GOAL #1   Title Pt will demo TUG <15 sec with LRAD and gait WFL    Baseline 19.08 sec with leaning on wall when turning d/t LOB with turn    Time 8    Period Weeks    Status New    Target Date 12/30/20      PT LONG TERM GOAL #2   Title Pt will demo gait x100 ft around clinic with LRAD and gait WFL    Baseline weaving and occasional LOB with gait    Time 8    Period Weeks    Status New    Target Date 12/30/20      PT LONG TERM GOAL #3   Title Pt will  report no additional falls    Time 8    Period Weeks    Status New    Target Date 12/30/20      PT LONG TERM GOAL #4   Title Pt will demo Berg Balance score improved to 52/56    Time 8    Period Weeks    Status New    Target Date 12/30/20                 Plan - 11/18/20 1115    Clinical Impression Statement Pt is making excellent progress with TE progressions of strength, balance, and endurance. She tolerated tx well with infrequent rest breaks, mainly standing throughout session. L LE continues to be weaker than R, but pt completes all exercises well with good form, increased cuing manually/verbally on L LE.    PT Treatment/Interventions ADLs/Self Care Home Management;Neuromuscular re-education;Balance training;Therapeutic exercise;Therapeutic activities;Functional mobility training;Gait training;Stair training;Patient/family education    PT Next Visit Plan balance ex's, endurance, gait training    Consulted and Agree with Plan of Care Patient           Patient will benefit from skilled therapeutic intervention in order to improve the following deficits and impairments:  Abnormal gait,Decreased range of motion,Difficulty walking,Decreased endurance,Decreased safety awareness,Decreased activity tolerance,Decreased balance,Decreased mobility,Decreased  strength,Postural dysfunction  Visit Diagnosis: Repeated falls  Other abnormalities of gait and mobility  Muscle weakness (generalized)  Difficulty in walking, not elsewhere classified     Problem List There are no problems to display for this patient.   Izell Williford, PT, DPT 11/18/2020, 11:47 AM  [place]. [place], [place], [address] Phone: [phone]   Fax:  [phone]  Name: Shawna Lowe MRN: [id number] Date of Birth: 1951/08/06

## 2020-11-23 ENCOUNTER — Other Ambulatory Visit: Payer: Self-pay

## 2020-11-23 ENCOUNTER — Ambulatory Visit: Payer: Medicare HMO | Admitting: Physical Therapy

## 2020-11-23 ENCOUNTER — Encounter: Payer: Self-pay | Admitting: Physical Therapy

## 2020-11-23 DIAGNOSIS — R262 Difficulty in walking, not elsewhere classified: Secondary | ICD-10-CM

## 2020-11-23 DIAGNOSIS — R296 Repeated falls: Secondary | ICD-10-CM

## 2020-11-23 DIAGNOSIS — R2689 Other abnormalities of gait and mobility: Secondary | ICD-10-CM

## 2020-11-23 DIAGNOSIS — M6281 Muscle weakness (generalized): Secondary | ICD-10-CM

## 2020-11-23 NOTE — Therapy (Signed)
[place]. [place], [place], [address] Phone: [phone]   Fax:  [phone]  Physical Therapy Treatment  Patient Details  Name: Shawna Lowe MRN: [id number] Date of Birth: 1950-10-22 Referring Provider (PT): Moshe Cipro   Encounter Date: 11/23/2020   PT End of Session - 11/23/20 1008    Visit Number 5    Date for PT Re-Evaluation 01/04/21    PT Start Time 0926    PT Stop Time 1009    PT Time Calculation (min) 43 min    Activity Tolerance Patient tolerated treatment well;Patient limited by fatigue    Behavior During Therapy [facility] for tasks assessed/performed           History reviewed. No pertinent past medical history.  History reviewed. No pertinent surgical history.  There were no vitals filed for this visit.   Subjective Assessment - 11/23/20 0923    Subjective Pt reports mild B foot pain this rx but states no changes otherwise    Currently in Pain? No/denies              [facility] PT Assessment - 11/23/20 0001      Timed Up and Go Test   Normal TUG (seconds) 15.12    TUG Comments with SPC; some weaving with gait, no LOB                         [facility] Adult PT Treatment/Exercise - 11/23/20 0001      Knee/Hip Exercises: Aerobic   Nustep L5 LE/UE x 6 min      Knee/Hip Exercises: Standing   Heel Raises Both;10 reps;1 set    Heel Raises Limitations 3#    Knee Flexion Both;1 set;10 reps    Knee Flexion Limitations 3#    Hip Flexion Both;1 set;10 reps    Hip Flexion Limitations 3#    Hip Abduction Both;1 set;10 reps    Abduction Limitations 3#    Hip Extension Both;1 set;10 reps    Extension Limitations 3#    Forward Step Up Both;1 set;10 reps;Hand Hold: 1;Step Height: 6"    Forward Step Up Limitations some catching of LLE on step    Other Standing Knee Exercises alt step taps no HR 6" stair x10 B CGA      Knee/Hip Exercises: Seated   Long Arc Quad Both;2 sets;10  reps;Weights    Long Arc Quad Weight 3 lbs.    Marching 2 sets;10 reps    Marching Weights 3 lbs.    Hamstring Curl Both;2 sets;10 reps    Hamstring Limitations red tb    Sit to Sand 2 sets;10 reps;without UE support   3# dumbbell chest press                   PT Short Term Goals - 11/23/20 0933      PT SHORT TERM GOAL #1   Title Pt will be I with initial HEP    Baseline reports intermittently performing HEP    Time 2    Period Weeks    Status On-going    Target Date 11/18/20             PT Long Term Goals - 11/23/20 0935      PT LONG TERM GOAL #1   Title Pt will demo TUG <15 sec with LRAD and gait WFL    Baseline 15.76 sec with SPC    Time 8  Period Weeks    Status Partially Met      PT LONG TERM GOAL #2   Title Pt will demo gait x100 ft around clinic with LRAD and gait WFL    Baseline gait x200 ft with SPC no LOB but weaving with gait    Time 8    Period Weeks    Status Partially Met      PT LONG TERM GOAL #3   Title Pt will report no additional falls    Baseline reports no additional falls    Time 8    Period Weeks    Status Partially Met      PT LONG TERM GOAL #4   Title Pt will demo Berg Balance score improved to 52/56    Time 8    Period Weeks    Status On-going                 Plan - 11/23/20 1009    Clinical Impression Statement Pt demos progress toward LTGs. Demos improved TUG time from score at eval. Kasandra Knudsen was too tall; adjusted SPC to appropriate height and gait training around clinic with education on AD/LE sequencing. Demos improved sequencing, still has tendency to carry cane but demos LOB occasionall without. Able to tolerate increased number of standing ex's this rx. Continue to progress functional strength/balance/gait.    PT Treatment/Interventions ADLs/Self Care Home Management;Neuromuscular re-education;Balance training;Therapeutic exercise;Therapeutic activities;Functional mobility training;Gait training;Stair  training;Patient/family education    PT Next Visit Plan balance ex's, endurance, gait training    Consulted and Agree with Plan of Care Patient           Patient will benefit from skilled therapeutic intervention in order to improve the following deficits and impairments:  Abnormal gait,Decreased range of motion,Difficulty walking,Decreased endurance,Decreased safety awareness,Decreased activity tolerance,Decreased balance,Decreased mobility,Decreased strength,Postural dysfunction  Visit Diagnosis: Repeated falls  Other abnormalities of gait and mobility  Muscle weakness (generalized)  Difficulty in walking, not elsewhere classified     Problem List There are no problems to display for this patient.  Amador Cunas, PT, DPT Donald Prose Suzy Kugel 11/23/2020, 10:11 AM  [place]. [place], [place], [address] Phone: [phone]   Fax:  [phone]  Name: Naysha Sholl MRN: [id number] Date of Birth: 1950-11-22

## 2020-11-25 ENCOUNTER — Ambulatory Visit: Payer: Medicare HMO | Admitting: Physical Therapy

## 2020-11-25 ENCOUNTER — Encounter: Payer: Self-pay | Admitting: Physical Therapy

## 2020-11-25 ENCOUNTER — Other Ambulatory Visit: Payer: Self-pay

## 2020-11-25 DIAGNOSIS — R296 Repeated falls: Secondary | ICD-10-CM

## 2020-11-25 DIAGNOSIS — R262 Difficulty in walking, not elsewhere classified: Secondary | ICD-10-CM

## 2020-11-25 DIAGNOSIS — R2689 Other abnormalities of gait and mobility: Secondary | ICD-10-CM

## 2020-11-25 DIAGNOSIS — M6281 Muscle weakness (generalized): Secondary | ICD-10-CM

## 2020-11-25 NOTE — Therapy (Signed)
[place]. [place], [place], [address] Phone: [phone]   Fax:  [phone]  Physical Therapy Treatment  Patient Details  Name: Shawna Lowe MRN: [id number] Date of Birth: 09-14-1950 Referring Provider (PT): Moshe Cipro   Encounter Date: 11/25/2020   PT End of Session - 11/25/20 1026    Visit Number 6    Date for PT Re-Evaluation 01/04/21    PT Start Time 0927    PT Stop Time 1011    PT Time Calculation (min) 44 min    Activity Tolerance Patient tolerated treatment well;Patient limited by fatigue    Behavior During Therapy [facility] for tasks assessed/performed           History reviewed. No pertinent past medical history.  History reviewed. No pertinent surgical history.  There were no vitals filed for this visit.   Subjective Assessment - 11/25/20 0937    Subjective Pt reports no changes since last rx    Currently in Pain? No/denies                             [facility] Adult PT Treatment/Exercise - 11/25/20 0001      Knee/Hip Exercises: Aerobic   Nustep L5 LE/UE x 6 min    Other Aerobic UBE L1.5 x3 min each      Knee/Hip Exercises: Machines for Strengthening   Cybex Knee Extension 5# 2x10 BLE    Cybex Knee Flexion 15# 2x10 BLE    Cybex Leg Press 20# 2x10 BLE      Knee/Hip Exercises: Standing   Heel Raises Both;10 reps;1 set    Heel Raises Limitations 3#    Knee Flexion Both;1 set;10 reps    Knee Flexion Limitations 3#    Hip Flexion Both;1 set;10 reps    Hip Flexion Limitations 3#    Hip Abduction Both;1 set;10 reps    Abduction Limitations 3#    Hip Extension Both;1 set;10 reps    Extension Limitations 3#                    PT Short Term Goals - 11/23/20 0933      PT SHORT TERM GOAL #1   Title Pt will be I with initial HEP    Baseline reports intermittently performing HEP    Time 2    Period Weeks    Status On-going    Target Date 11/18/20              PT Long Term Goals - 11/23/20 0935      PT LONG TERM GOAL #1   Title Pt will demo TUG <15 sec with LRAD and gait WFL    Baseline 15.76 sec with SPC    Time 8    Period Weeks    Status Partially Met      PT LONG TERM GOAL #2   Title Pt will demo gait x100 ft around clinic with LRAD and gait WFL    Baseline gait x200 ft with SPC no LOB but weaving with gait    Time 8    Period Weeks    Status Partially Met      PT LONG TERM GOAL #3   Title Pt will report no additional falls    Baseline reports no additional falls    Time 8    Period Weeks    Status Partially Met      PT  LONG TERM GOAL #4   Title Pt will demo Berg Balance score improved to 52/56    Time 8    Period Weeks    Status On-going                 Plan - 11/25/20 1027    Clinical Impression Statement Pt able to tolerate increased number of standing ex's this rx before requiring a break. Increased SOB and coughing after cardio warmup. Progressed to machine interventions with no c/o of increased pain. Cues for full ROM with knee flexion/extension. Encouraged pt daughter to call MD regarding transplant to see when they need to come in for a follow up with VU and agreement. Follow up on this next rx. Continue to progress functional strength, balance, and gait.    PT Treatment/Interventions ADLs/Self Care Home Management;Neuromuscular re-education;Balance training;Therapeutic exercise;Therapeutic activities;Functional mobility training;Gait training;Stair training;Patient/family education    PT Next Visit Plan balance ex's, endurance, gait training    Consulted and Agree with Plan of Care Patient           Patient will benefit from skilled therapeutic intervention in order to improve the following deficits and impairments:  Abnormal gait,Decreased range of motion,Difficulty walking,Decreased endurance,Decreased safety awareness,Decreased activity tolerance,Decreased balance,Decreased mobility,Decreased  strength,Postural dysfunction  Visit Diagnosis: Repeated falls  Other abnormalities of gait and mobility  Muscle weakness (generalized)  Difficulty in walking, not elsewhere classified     Problem List There are no problems to display for this patient.  Amador Cunas, PT, DPT [name] 11/25/2020, 10:30 AM  [place]. [place], [place], [address] Phone: [phone]   Fax:  [phone]  Name: Shawna Lowe MRN: [id number] Date of Birth: 18-Aug-1951

## 2020-11-30 ENCOUNTER — Encounter: Payer: Self-pay | Admitting: Internal Medicine

## 2020-11-30 ENCOUNTER — Other Ambulatory Visit: Payer: Self-pay

## 2020-11-30 ENCOUNTER — Ambulatory Visit: Payer: Medicare HMO | Admitting: Internal Medicine

## 2020-11-30 DIAGNOSIS — R053 Chronic cough: Secondary | ICD-10-CM | POA: Diagnosis not present

## 2020-11-30 DIAGNOSIS — R059 Cough, unspecified: Secondary | ICD-10-CM

## 2020-11-30 DIAGNOSIS — J4489 Other specified chronic obstructive pulmonary disease: Secondary | ICD-10-CM

## 2020-11-30 DIAGNOSIS — J449 Chronic obstructive pulmonary disease, unspecified: Secondary | ICD-10-CM

## 2020-11-30 MED ORDER — PREDNISONE 10 MG PO TABS
ORAL_TABLET | ORAL | 0 refills | Status: DC
Start: 2020-11-30 — End: 2020-12-21

## 2020-11-30 MED ORDER — BUDESONIDE 0.25 MG/2ML IN SUSP: RESPIRATORY_TRACT | 12 refills | Status: DC

## 2020-11-30 NOTE — Progress Notes (Signed)
Shawna Lowe, female    DOB: [birth year]     MRN: [id number]   Brief patient profile:  70 yo [place] female  Never smoker with environmental smoke  exp moved to [place]  Initially [place] in 1986 and denies resp problems at time she moved but apparently developed rhinitis seasonally ? What Year then around 2018  started with more cough/ wheeze/ sob while still in [place] prior to moving to [place] in present home around fall 2020 and eval by Pulmonary at [facility] with dx of AB/ bronchiectasis with no relief other than temporarily p pred rx so referred to pulmonary clinic 11/30/2020 by Shawna Lowe.     History of Present Illness  11/30/2020  Pulmonary/ 1st office eval/Shawna Lowe bronchiectassis/ ? COPD?AB/  Supposed to be on qvar / off acei x fall of 2021  Chief Complaint  Patient presents with  . Pulmonary Consult    Referred by Shawna Corliss Lowe.  Pt c/o cough x 3 years- occ prod with yellow sputum. Cough is esp worse at night and when exposed to strong smells and cold weather.  Dyspnea:  Able to walk at grocery / walks across parking lot, up steps  Cough: p lying down / cold weather worse prod scant yellow mucus, no better with abx Sleep: bed is flat/ props up on 2 pillows  SABA use: neb at hs  Overt hb because tried ppi and made her too sleepy per daughter, not taking it  as rec  No obvious day to day or daytime variability or assoc   mucus plugs or hemoptysis or cp or chest tightness, subjective wheeze    Also denies any obvious fluctuation of symptoms with weather or environmental changes or other aggravating or alleviating factors except as outlined above   No unusual exposure hx or h/o childhood pna/ asthma or knowledge of premature birth.  Current Allergies, Complete Past Medical History, Past Surgical History, Family History, and Social History were reviewed in [employer] record.  ROS  The following are not active complaints unless bolded Hoarseness, sore throat,  dysphagia, dental problems, itching, sneezing,  nasal congestion or discharge of excess mucus or purulent secretions, ear ache,   fever, chills, sweats, unintended wt loss or wt gain, classically pleuritic or exertional cp,  orthopnea pnd or arm/hand swelling  or leg swelling, presyncope, palpitations, abdominal pain, anorexia, nausea, vomiting, diarrhea  or change in bowel habits or change in bladder habits, change in stools or change in urine, dysuria, hematuria,  rash, arthralgias, visual complaints, headache, numbness, weakness or ataxia or problems with walking or coordination,  change in mood or  memory.           No past medical history on file.  Outpatient Medications Prior to Visit  Medication Sig Dispense Refill  . acetylcysteine (MUCOMYST) 10 % nebulizer solution Take 2 mLs by nebulization every 4 (four) hours.    [name] albuterol (PROVENTIL) (2.5 MG/3ML) 0.083% nebulizer solution Take 2.5 mg by nebulization every 6 (six) hours as needed for wheezing or shortness of breath.    [name] aspirin 325 MG tablet Take 325 mg by mouth daily.    [name] atorvastatin (LIPITOR) 80 MG tablet Take 80 mg by mouth daily.    . benzonatate (TESSALON) 100 MG capsule Take 100 mg by mouth 2 (two) times daily as needed for cough.    . fluticasone (FLONASE) 50 MCG/ACT nasal spray Place into both nostrils daily.    . fluticasone (FLOVENT HFA) 110  MCG/ACT inhaler Inhale 2 puffs into the lungs 2 (two) times daily.    [name] glipiZIDE (GLUCOTROL) 10 MG tablet Take 10 mg by mouth 2 (two) times daily before a meal.    . Insulin Glargine (BASAGLAR KWIKPEN) 100 UNIT/ML Inject 15 Units into the skin daily.    . insulin glargine (LANTUS) 100 UNIT/ML injection Inject 30 Units into the skin daily.    [name] losartan (COZAAR) 100 MG tablet Take 100 mg by mouth daily.    . metoprolol succinate (TOPROL-XL) 50 MG 24 hr tablet Take 50 mg by mouth daily. Take with or immediately following a meal.    . montelukast (SINGULAIR) 10 MG tablet Take 10 mg by  mouth at bedtime.    . Olopatadine HCl 0.2 % SOLN Apply to eye daily.    . pantoprazole (PROTONIX) 40 MG tablet Take 40 mg by mouth daily.    . sevelamer carbonate (RENVELA) 800 MG tablet Take 800 mg by mouth 3 (three) times daily with meals.     No facility-administered medications prior to visit.     Objective:     BP 138/60 (BP Location: Left Arm, Cuff Size: Normal)   Pulse 85   Temp 97.9 F (36.6 C) (Temporal)   Ht 5' (1.524 m)   Wt 165 lb (74.8 kg)   SpO2 97% Comment: on RA  BMI 32.22 kg/m   SpO2: 97 % (on RA)  Elderly asian female rattling congested sounding cough    HEENT : pt wearing mask not removed for exam due to covid -19 concerns.    NECK :  without JVD/Nodes/TM/ nl carotid upstrokes bilaterally   LUNGS: no acc muscle use,  Nl contour chest  With coarse mild  insp exp rhonchi bilaterally without cough on insp or exp maneuvers   CV:  RRR  no s3 or murmur or increase in P2, and no edema   ABD:  soft and nontender with nl inspiratory excursion in the supine position. No bruits or organomegaly appreciated, bowel sounds nl  MS:  Nl gait/ ext warm without deformities, calf tenderness, cyanosis or clubbing No obvious joint restrictions   SKIN: warm and dry without lesions    NEURO:  alert, approp, nl sensorium with  no motor or cerebellar deficits apparent.      I personally reviewed images and agree with radiology impression as follows:  CXR:   PA AND LATERAL  06/28/21  No radiographic evidence of acute cardiopulmonary disease.      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   These conditions, singly or in combination, have accounted for up to 94% of the causes of chronic cough in prospective studies.   Other conditions have constituted no >6% of the causes in prospective studies These have included bronchogenic carcinoma, chronic interstitial pneumonia, sarcoidosis, left ventricular failure, ACEI-induced cough, and aspiration from a condition associated with pharyngeal dysfunction.    Chronic cough is often simultaneously caused by more than one condition. A single cause has been found  from 38 to 82% of the time, multiple causes from 18 to 62%. Multiply caused cough has been the result of three diseases up to 42% of the time.   I strongly suspect this is multifactorial cough but note the hx of a good response to prednisone does not mean this is all asthma related.  More than likely has an element of  Upper airway cough syndrome (previously labeled PNDS),  is so named because it's frequently impossible to sort out how much is  CR/sinusitis with freq throat clearing (which can be related to primary GERD)   vs  causing  secondary (" extra esophageal")  GERD from wide swings in gastric pressure that occur with throat clearing, often  promoting self use of mint and menthol lozenges that reduce the lower esophageal sphincter tone and exacerbate the problem further in a cyclical fashion.   These are the same pts (now being labeled as having "irritable larynx syndrome" by some cough centers) who not infrequently have a history of having failed to tolerate ace inhibitors,  dry powder inhalers (and sometimes hfa fluticasone) or biphosphonates or report having atypical/extraesophageal reflux symptoms that don't respond to standard doses of PPI  and are easily confused as having aecopd or asthma flares by even experienced  allergists/ pulmonologists (myself included).    rec Sinus ct  rx AB  (see below) Max rx for gerd  F/u q 2 weeks to assure adherence;  Explained to pt/ daughter: The standardized cough guidelines published in Chest by Lissa Morales in 2006 are still the best available and consist of a multiple step process (up to 12!) , not a single office visit,  and are intended  to address this problem logically,  with an alogrithm dependent on response to empiric treatment at  each progressive step  to determine a specific diagnosis with  minimal addtional testing needed. Therefore if adherence is an issue or can't be accurately verified,  it's very unlikely the standard evaluation and treatment will be successful here.    Furthermore, response to therapy (other than acute cough suppression, which should only be used short term with avoidance of narcotic containing cough syrups if possible), can be a gradual process for which the patient is not likely to  perceive immediate benefit.  Unlike going to an eye doctor where the best perscription is almost always the first one and is immediately effective, this is almost never the case in the management of chronic cough syndromes. Therefore the patient needs to commit up front to consistently adhere to recommendations  for up to 6 weeks of therapy directed at the likely underlying problem(s) before the response can be reasonably evaluated.         Asthmatic bronchitis , chronic ([place]) Never smoker but exp to wood burning indoor fires in [place]  - 11/30/2020  After extensive coaching inhaler device,  effectiveness =    0% so changed to neb bud/ performist trial basis - if can't afford latter but responds would substitute albuterol bid  And then use it also prn all in neb form form now         Each maintenance medication was reviewed in detail including emphasizing most importantly the difference between maintenance and prns and under what circumstances the prns are  to be triggered using an action plan format where appropriate.  Total time for H and P, chart review, counseling, reviewing hfa/neb device(s) and generating customized AVS unique to this new pt office visit / same  day charting = 46 min         Christinia Gully, MD 11/30/2020

## 2020-11-30 NOTE — Patient Instructions (Addendum)
Stop flovent ,  Stop mucomyst (acetyl cysteine)   Prednisone 10 mg take  4 each am x 2 days,   2 each am x 2 days,  1 each am x 2 days and stop    Budesonide (prednisone) 0.25 mg twice daily with Permormist  first thing in am and 12 hours later  Only use your albuterol as a rescue medication to be used if you can't catch your breath by resting or doing a relaxed purse lip breathing pattern.  - The less you use it, the better it will work when you need it. - Ok to use up to every 4 hours if you must but call for immediate appointment if use goes up over your usual need - Don't leave home without it !!  (think of it like the spare tire for your car)   Protonix 40 mg Take 30- 60 min before your first and last meals of the day   GERD (REFLUX)  is an extremely common cause of respiratory symptoms just like yours , many times with no obvious heartburn at all.    It can be treated with medication, but also with lifestyle changes including elevation of the head of your bed (ideally with 6-8inch blocks under the headboard of your bed),  Smoking cessation, avoidance of late meals, excessive alcohol, and avoid fatty foods, chocolate, peppermint, colas, red wine, and acidic juices such as orange juice.  NO MINT OR MENTHOL PRODUCTS SO NO COUGH DROPS  USE SUGARLESS CANDY INSTEAD (Jolley ranchers or Stover's or Life Savers) or even ice chips will also do - the key is to swallow to prevent all throat clearing. NO OIL BASED VITAMINS - use powdered substitutes.  Avoid fish oil when coughing.  We will call to arrange sinus CT   Please schedule a follow up office visit in 2  weeks, sooner if needed  with all medications /inhalers/ solutions in hand so we can verify exactly what you are taking. This includes all medications from all doctors and over the counters - needs to See Derl Barrow NP

## 2020-12-01 ENCOUNTER — Encounter: Payer: Self-pay | Admitting: Internal Medicine

## 2020-12-01 DIAGNOSIS — J449 Chronic obstructive pulmonary disease, unspecified: Secondary | ICD-10-CM | POA: Insufficient documentation

## 2020-12-01 DIAGNOSIS — J4489 Other specified chronic obstructive pulmonary disease: Secondary | ICD-10-CM | POA: Insufficient documentation

## 2020-12-01 NOTE — Assessment & Plan Note (Addendum)
Never smoker but exp to wood burning indoor fires in [place]  - 11/30/2020  After extensive coaching inhaler device,  effectiveness =    0% so changed to neb bud/ performist trial basis - if can't afford latter but responds would substitute albuterol bid  And then use it also prn all in neb form form now          Each maintenance medication was reviewed in detail including emphasizing most importantly the difference between maintenance and prns and under what circumstances the prns are to be triggered using an action plan format where appropriate.  Total time for H and P, chart review, counseling, reviewing hfa/neb device(s) and generating customized AVS unique to this new pt office visit / same day charting = 46 min

## 2020-12-01 NOTE — Assessment & Plan Note (Signed)
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   These conditions, singly or in combination, have accounted for up to 94% of the causes of chronic cough in prospective studies.   Other conditions have constituted no >6% of the causes in prospective studies These have included bronchogenic carcinoma, chronic interstitial pneumonia, sarcoidosis, left ventricular failure, ACEI-induced cough, and aspiration from a condition associated with pharyngeal dysfunction.    Chronic cough is often simultaneously caused by more than one condition. A single cause has been found from 38 to 82% of the time, multiple causes from 18 to 62%. Multiply caused cough has been the result of three diseases up to 42% of the time.   I strongly suspect this is multifactorial cough but note the hx of a good response to prednisone does not mean this is all asthma related.  More than likely has an element of  Upper airway cough syndrome (previously labeled PNDS),  is so named because it's frequently impossible to sort out how much is  CR/sinusitis with freq throat clearing (which can be related to primary GERD)   vs  causing  secondary (" extra esophageal")  GERD from wide swings in gastric pressure that occur with throat clearing, often  promoting self use of mint and menthol lozenges that reduce the lower esophageal sphincter tone and exacerbate the problem further in a cyclical fashion.   These are the same pts (now being  labeled as having "irritable larynx syndrome" by some cough centers) who not infrequently have a history of having failed to tolerate ace inhibitors,  dry powder inhalers (and sometimes hfa fluticasone) or biphosphonates or report having atypical/extraesophageal reflux symptoms that don't respond to standard doses of PPI  and are easily confused as having aecopd or asthma flares by even experienced allergists/ pulmonologists (myself included).    rec Sinus ct  rx AB  (see below) Max rx for gerd  F/u q 2 weeks to assure adherence;  Explained to pt/ daughter: The standardized cough guidelines published in Chest by Lissa Morales in 2006 are still the best available and consist of a multiple step process (up to 12!) , not a single office visit,  and are intended  to address this problem logically,  with an alogrithm dependent on response to empiric treatment at  each progressive step  to determine a specific diagnosis with  minimal addtional testing needed. Therefore if adherence is an issue or can't be accurately verified,  it's very unlikely the standard evaluation and treatment will be successful here.    Furthermore, response to therapy (other than acute cough suppression, which should only be used short term with avoidance of narcotic containing cough syrups if possible), can be a gradual process for which the patient is not likely to  perceive immediate benefit.  Unlike going to an eye doctor where the best perscription is almost always the first one and is immediately effective, this is almost never the case in the management of chronic cough syndromes. Therefore the patient needs to commit up front to consistently  adhere to recommendations  for up to 6 weeks of therapy directed at the likely underlying problem(s) before the response can be reasonably evaluated.

## 2020-12-02 ENCOUNTER — Other Ambulatory Visit: Payer: Self-pay

## 2020-12-02 ENCOUNTER — Ambulatory Visit: Payer: Medicare HMO

## 2020-12-02 DIAGNOSIS — R2689 Other abnormalities of gait and mobility: Secondary | ICD-10-CM

## 2020-12-02 DIAGNOSIS — R262 Difficulty in walking, not elsewhere classified: Secondary | ICD-10-CM

## 2020-12-02 DIAGNOSIS — M6281 Muscle weakness (generalized): Secondary | ICD-10-CM

## 2020-12-02 DIAGNOSIS — R296 Repeated falls: Secondary | ICD-10-CM | POA: Diagnosis not present

## 2020-12-02 NOTE — Therapy (Signed)
[place] [place], [place], [address] Phone: [phone]   Fax:  [phone]  Physical Therapy Treatment  Patient Details  Name: Shawna Lowe MRN: [id number] Date of Birth: 1951/06/04 Referring Provider (PT): Moshe Cipro   Encounter Date: 12/02/2020   PT End of Session - 12/02/20 0908    Visit Number 7    Date for PT Re-Evaluation 01/04/21    Authorization Type [employer]    PT Start Time 0848    PT Stop Time 0930    PT Time Calculation (min) 42 min    Equipment Utilized During Treatment Gait belt    Activity Tolerance Patient tolerated treatment well;Patient limited by fatigue    Behavior During Therapy [facility] for tasks assessed/performed           History reviewed. No pertinent past medical history.  History reviewed. No pertinent surgical history.  There were no vitals filed for this visit.   Subjective Assessment - 12/02/20 0852    Subjective Per daughter Waiting to hear back about how much longer needs PT before transplant. Otherwise no new complaints or falls    Patient is accompained by: Family member   Shawna Lowe, daughter   Pertinent History hx of CVA with L hemiparesis (2016), DM, HTN, asthma, ESRD    Limitations Standing;Walking;House hold activities    Patient Stated Goals get stronger to be able to do more around the house and to be eligible for kidney transplant    Currently in Pain? No/denies              [facility] PT Assessment - 12/02/20 0001      Berg Balance Test   Sit to Stand Able to stand without using hands and stabilize independently    Standing Unsupported Able to stand 2 minutes with supervision    Sitting with Back Unsupported but Feet Supported on Floor or Stool Able to sit safely and securely 2 minutes    Stand to Sit Sits safely with minimal use of hands    Transfers Able to transfer safely, definite need of hands    Standing Unsupported with Eyes Closed Able to stand 10 seconds  safely    Standing Unsupported with Feet Together Able to place feet together independently and stand for 1 minute with supervision    From Standing, Reach Forward with Outstretched Arm Can reach forward >12 cm safely (5")    From Standing Position, Pick up Object from Floor Able to pick up shoe, needs supervision    From Standing Position, Turn to Look Behind Over each Shoulder Turn sideways only but maintains balance    Turn 360 Degrees Able to turn 360 degrees safely but slowly   5 seocnds each   Standing Unsupported, Alternately Place Feet on Step/Stool Able to stand independently and complete 8 steps >20 seconds    Standing Unsupported, One Foot in Front Able to take small step independently and hold 30 seconds   easier with LLE leading   Standing on One Leg Tries to lift leg/unable to hold 3 seconds but remains standing independently    Total Score 41      Timed Up and Go Test   Normal TUG (seconds) 15.12    TUG Comments with SPC; some weaving with gait, no LOB                         [facility] Adult PT Treatment/Exercise - 12/02/20 0001  High Level Balance   High Level Balance Activities Side stepping;Backward walking;Marching forwards;Marching backwards    High Level Balance Comments close supervision. CGA with sidestepping backwards walking and backwards marching      Knee/Hip Exercises: Aerobic   Nustep L5 LE/UE x 97mn    Other Aerobic UBE L1 x3 min each      Knee/Hip Exercises: Standing   Heel Raises Both;10 reps;1 set    Heel Raises Limitations 3#    Knee Flexion Both;1 set;10 reps    Knee Flexion Limitations 3#    Hip Flexion Both;1 set;10 reps    Hip Flexion Limitations 3#    Hip Abduction Both;1 set;10 reps    Abduction Limitations 3#    Hip Extension Both;1 set;10 reps    Extension Limitations 3#    Other Standing Knee Exercises alt step taps no HR 6" stair x10 B CGA    Other Standing Knee Exercises toe raises x 10 standing                     PT Short Term Goals - 12/02/20 0910      PT SHORT TERM GOAL #1   Title Pt will be I with initial HEP    Baseline reports intermittently performing HEP    Time 2    Period Weeks    Status On-going             PT Long Term Goals - 12/02/20 01660     PT LONG TERM GOAL #1   Title Pt will demo TUG <15 sec with LRAD and gait WFL    Baseline 15.76 sec with SPC    Time 8    Period Weeks    Status Partially Met      PT LONG TERM GOAL #2   Title Pt will demo gait x100 ft around clinic with LRAD and gait WFL    Baseline gait x200 ft with SPC no LOB but weaving with gait    Time 8    Period Weeks    Status Partially Met      PT LONG TERM GOAL #3   Title Pt will report no additional falls    Baseline reports no additional falls    Time 8    Period Weeks    Status Partially Met      PT LONG TERM GOAL #4   Title Pt will demo Berg Balance score improved to 52/56    Time 8    Period Weeks    Status On-going                 Plan - 12/02/20 0908    Clinical Impression Statement Pt is making gradual prgress toward goals. Pt able to tolerate increased number of standing ex's this session before requiring a break. Increased SOB and coughing after cardio warmup. She continues to demonstrate diminished balance, especially with backwards wlaking and sidestepping.  Encouraged pt daughter to follow up with MD regarding transplant to see when they need to come in for a follow up with VU and agreement. Jerah will benefit from continued skilled physical therapy to progress functional strength, balance, and gait.    Personal Factors and Comorbidities Comorbidity 3+    Comorbidities HTN, DM, asthma, ESRD    Examination-Participation Restrictions Community Activity;Interpersonal Relationship;Meal Prep;Cleaning    Rehab Potential Good    PT Frequency 2x / week    PT Duration 8 weeks    PT Treatment/Interventions  ADLs/Self Care Home Management;Neuromuscular  re-education;Balance training;Therapeutic exercise;Therapeutic activities;Functional mobility training;Gait training;Stair training;Patient/family education    PT Next Visit Plan balance ex's, endurance, gait training. Follow up regarding any new information regarding transplant    Consulted and Agree with Plan of Care Patient           Patient will benefit from skilled therapeutic intervention in order to improve the following deficits and impairments:  Abnormal gait,Decreased range of motion,Difficulty walking,Decreased endurance,Decreased safety awareness,Decreased activity tolerance,Decreased balance,Decreased mobility,Decreased strength,Postural dysfunction  Visit Diagnosis: Repeated falls  Other abnormalities of gait and mobility  Muscle weakness (generalized)  Difficulty in walking, not elsewhere classified     Problem List Patient Active Problem List   Diagnosis Date Noted  . Asthmatic bronchitis , chronic ([place]) 12/01/2020  . Chronic cough 11/30/2020    Hall Busing, PT, DPT 12/02/2020, 12:25 PM  [address]. [place], [place], [address] Phone: [phone]   Fax:  [phone]  Name: Darletta Noblett MRN: [id number] Date of Birth: 1951-06-14

## 2020-12-07 NOTE — Progress Notes (Signed)
[facility] OFFICE  [id number] [facility]. [place], [place] [address]     Jacqueline Weber    Date of Visit:  12/31/2015  Date of Birth: 11/02/1950  Age: 70 yrs.   Medical Record Number: [id number]  __  CURRENT DIAGNOSES     1. Shortness Of Breath, R06.02   2. Chest Pain, Unspecified, R07.9  3. Carotid bruit, R09.89  4. Abnormal test-abnormal pharmacologic stress nuclear study, R94.30  __  ALLERGIES     Penicillins, Hives and/or rash  Penicillins, Shortness of breath  __  MEDICATIONS     1.  aspirin 325 mg tablet, 1 po qd  2. carvedilol 25 mg tablet, 1 po bid  3. famotidine 20 mg tablet, 1 po qd  4. Allergy Relief (fluticasone) 50 mcg/actuation nasal spray,suspension, as directed  5. glimepiride 4 mg tablet, 1 po qd  6.  meclizine 25 mg tablet, 1 po tid  7. metformin 1,000 mg tablet, 1 po bid  8. simvastatin 10 mg tablet, 1 po qhs  9. Isosorbide Mononitrate Er 30 Mg Tablet,extended Release 24 Hr, 1 po qd  10. Amlodipine 10 Mg Tablet, 1 po qd  __   CHIEF COMPLAINT/REASON FOR VISIT  Followup of Chest Pain, Unspecified  __  HISTORY OF PRESENT ILLNESS   A pleasant 70 year old female accompanied by her daughter seen at [facility] for an episode of chest pain. She was scheduled to have an outpatient ischemic evaluation with echocardiogram and stress test. She did note a small area of ischemic  of the inferior wall. We are attempting to treat this medically. Overall she does get chest pain with exercise but states that it is improving.  _____  PAST HISTORY      Past Medical Illnesses: Diabetes mellitus-adult onset, Hyperlipidemia, Hypertension;  Past Cardiac Illnesses : No previous history of cardiac disease.; Infectious Diseases: No previous history of significant infectious diseases.;  Surgical Procedures: No previous surgical procedures.; Trauma History: No previous history of significant  trauma.; Cardiology Procedures-Invasive: No previous interventional or invasive cardiology procedures.;  Cardiology  Procedures-Noninvasive: MPI Single Isotope Lexiscan February 2017, Echocardiogram February 2017; Left Ventricular Ejection Fraction : LVEF of 70% documented via echocardiogram on 10/20/2015    __  CARDIAC RISK FACTORS      Tobacco Abuse: has never used tobacco; Family History of Heart Disease: no family history of cardiovascular  disease; Hyperlipidemia: positive; Hypertension: positive;   Diabetes Mellitus: positive; Prior History of Heart Disease : negative; Obesity: positive; Sedentary Life Style :negative; [id number]; Menopausal:biological menopause   __  SOCIAL HISTORY    Alcohol Use: Does not use  alcohol; Smoking: Does not smoke; Never smoker ([id number]); Diet : Regular diet and Caffeine use-1-2 per day; Exercise: No regular exercise;   __  PHYSICAL EXAMINATION     Vital Signs:  Blood Pressure:  130/66 Sitting, Left arm, regular cuff  120/60 Sitting, Right arm,  regular cuff    Weight: 149.00 lbs.  Height: 60"   BMI: 29   Pulse: 80/min. Apical Regular   Respirations:  16/min. regular and relaxed      Constitutional: Cooperative, alert and oriented,well developed,  well nourished, in no acute distress. Skin: Warm and dry to touch, no apparent skin lesions, or masses noted.  Head: Normocephalic, normal hair pattern, no masses or tenderness Eyes : EOMS Intact, PERRL, conjunctivae and lids normal. Funduscopic exam and visual fields not performed. ENT:  Ears, Nose and throat reveal no gross abnormalities. No pallor or cyanosis. Dentition good. Neck:  no  JVD, + bruit Chest: Normal symmetry, no tenderness to palpation, normal respiratory excursion, no intercostal retraction, no use of accessory muscles,  normal diaphragmatic excursion, clear to auscultation and percussion. Cardiac: Regular rhythm, S1 normal, S2 normal, no murmurs  Abdomen: Abdomen soft, bowel sounds normoactive, no masses, no hepatosplenomegaly, non-tender, no bruits Peripheral Pulses : The femoral, popliteal, dorsalis pedis, and posterior tibial  pulses are full and equal bilaterally with no bruits auscultated. Extremities/Back : no edema present Neurological: No gross motor or sensory deficits noted, affect appropriate, oriented  to time, person and place.   __    Medications added today by the physician:    IMPRESSIONS:   1. Chest pain. Recent hospitalization at [facility] 09/2015.  2. Shortness of breath and dyspnea on exertion, chest pain with activity.  a. Improving on nitrate therapy.  3. Nuclear stress test 10/28/2015: Small size, mild intensity  inferior apical ischemia, ejection   fraction 72%.  4. Echocardiogram 10/20/2015: Ejection fraction 78%, concentric left ventricular hypertrophy,   grade 1 diastolic dysfunction and mild aortic insufficiency.  5. Hypertension. Blood pressure  is stable.  6. Hyperlipidemia.  a. From 09/2015, total cholesterol 148, triglycerides 74, HDL 49, LDL 84 (all at goal).    RECOMMENDATIONS:   1. The patient will continue taking aspirin 81, carvedilol 3.125 b.i.d., simvastatin 10, Imdur will   increase from 30 mg to 60 mg, and amlodipine will stay at 10. She will see me back in two   weeks' time. I will attempt to continue treating  her small area of ischemia with medical therapy.   If she states that her blood pressure is resolved, would then continue the current course of action.   If she still has chest pain on the next visit, would then consider sending her for cardiac  catheterization   for primarily symptomatic relief.   2. The patient has a carotid bruit as well and will schedule her for a carotid ultrasound the next visit.   We will address this once her chest pain issues are under control.     Jacqueline Weber E. Vonzella Nipple, MD    AEC/tutjm     cc: Jacqueline Spurling PA    SJ  ____________________________   Cleda Clarks  [id number] Education ICD-10: R09.89 MedlinePlus Connect results for ICD-10 R09.89  Return Visit 15 MIN 2 weeks

## 2020-12-07 NOTE — Progress Notes (Signed)
[facility]  [address]. [place], [place] [address]     Jacqueline Weber    Date of Visit:  09/19/2016  Date of Birth: May 19, 1951  Age: 70 yrs.   Medical Record Number: [id number]  __  CURRENT DIAGNOSES     1. Hypertension (essential or benign  or malignant), I10  2. Acute NSTEMI, I21.4  3. Chest Pain, Unspecified, R07.9  4. Abnormal test-abnormal pharmacologic stress nuclear study, R94.30  5. Pre-op cardiovascular examination, Z01.810  6. Presence Of Aortocoronary Bypass  Graft, Z95.1  7. Carotid bruit, R09.89  8. CAD with unspecified angina pectoris, I25.119  9. Shortness Of Breath, R06.02  __  ALLERGIES     Penicillins, Hives and/or rash  Penicillins, Shortness of breath  __  MEDICATIONS     1.  Flonase Allergy Relief 50 mcg/actuation nasal spray,suspension, Take as Directed  2. Januvia 25 mg tablet, 1 po  3. aspirin 325 mg tablet, 1 po qd  4. clonidine HCl 0.2 mg tablet, 1 po bid  5. Senna Lax 8.6 mg tablet, 2 po bid  6.  tramadol 50 mg tablet, 1 po qpm PRN  7. Vitamin D3 2,000 unit tablet, 1 po bid  8. hydralazine 100 mg tablet, 1 po bid  9. ferrous sulfate 325 mg (65 mg iron) tablet, 1 po qd  10. pantoprazole 40 mg tablet,delayed release, 1 po qd   11. carvedilol 12.5 mg tablet, 1 po bid  12. bumetanide 1 mg tablet, 0.5 tablets po bid  13. amlodipine 10 mg tablet, 1 po qd  14. simvastatin 20 mg tablet, 1 po qd  __  CHIEF COMPLAINT/REASON  FOR VISIT  Followup of Pre-op cardiovascular examination  __  HISTORY OF PRESENT ILLNESS  [name] is a very pleasant 70 year old female who is here for preoperative evaluation prior to colonoscopy with Dr. Janalyn Rouse. The patient states she has not had any chest pain apart from some pain around her incision. She denies any shortness of breath,  dizziness, lightheadedness, or palpitations. In the past, she had an episode of chest pain, prompting a visit to [facility]. Stress test at that time revealed mild inferior apical ischemia.  She is on medical therapy. Despite maximum medical  therapy, she continued to have chest pain. She underwent catheterization. She ended up having a non-ST-elevation MI with peak troponin of 8 prior to receiving charity care. At that time catheterization revealed she had three-vessel disease and she underwent  bypass surgery with Dr. Theo Dills. She has done well afterwards.  __  PAST HISTORY     Past  Medical Illnesses: Diabetes mellitus-adult onset, Hyperlipidemia, Hypertension;  Past Cardiac Illnesses : No previous history of cardiac disease.; Infectious Diseases: No previous history of significant infectious diseases.;  Surgical Procedures: No previous surgical procedures.; Trauma History: No previous history of significant  trauma.; Cardiology Procedures-Invasive: CABG 06/2016; Cardiology Procedures-Noninvasive : MPI Single Isotope Lexiscan February 2017, Echocardiogram February 2017, Carotid artery duplex 03/2016, Echocardiogram September 2017; Left Ventricular Ejection Fraction : LVEF of 65% documented via echocardiogram on 05/16/2016; Peripheral Vascular Procedures: Carotid NIVA July 2017     __  CARDIAC RISK FACTORS     Tobacco Abuse: has never used tobacco;  Family History of Heart Disease: no family history of cardiovascular disease; Hyperlipidemia: positive;  Hypertension: positive;  Diabetes Mellitus: positive;  Prior History of Heart Disease: negative; Obesity: positive;  Sedentary Life Style:negative; [id number]; Menopausal :biological menopause  __  SOCIAL HISTORY  Alcohol Use : Does not use alcohol; Smoking: Does not smoke; Never smoker ([id number]); Diet : Regular diet and Caffeine use-1-2 per day; Exercise: No regular exercise;   __  PHYSICAL EXAMINATION     Vital Signs:  Blood Pressure:  126/62 Sitting, Left arm, regular cuff  134/62 Sitting, Right arm,  regular cuff    Weight: 128.60 lbs.  Height: 60"   BMI: 25   Pulse: 71/min. Apical        Constitutional: Cooperative, alert and oriented,well  developed, well nourished, in no acute distress. Head : Normocephalic, normal hair pattern, no masses or tenderness Eyes: conjunctivae and lids normal  ENT: No pallor or cyanosis Neck:  no JVD, + bruit Chest: clear to auscultation bilaterally Cardiac : Regular rhythm, S1 normal, S2 normal, no murmurs Abdomen: abdomen normal, abdomen soft, non-tender  Peripheral Pulses: right radial +2 Extremities/Back:  +1 edema Neurological: No gross motor or sensory deficits noted, affect appropriate, oriented to time,  person and place.   __    Medications added today by the physician:      IMPRESSIONS:   1. Preoperative evaluation prior to colonoscopy with Dr. Janalyn Rouse.  2. CABG, SVG to PDA, SVG to circumflex, SVG to D1, LIMA to LAD, by Dr. Theo Dills, June 12, 2016.   a. Three-vessel disease by catheterization.  3. Non-ST-elevation  myocardial infarction, peak troponin 8, prior to bypass.  a. Echocardiogram, 05/2016, ejection fraction 65%, left atrium moderately dilated,   mild-to-moderate aortic insufficiency.   b. Nuclear stress test, 10/2015, small mild intensity inferior  apical ischemia.  4. Chronic kidney disease, creatinine around 2, August 2017.   a. Renal ultrasound, 05/2016, no cortical thinning noted. No significant stenosis.   Creatinine, July 08, 2016, 2.0.   5. 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 Odette Fraction.   6. Hypertension, stable.   7. Dyslipidemia.   a. 09/2015, TC 148, TG 74, HDL 49, LDL 84 (at goal  on statin therapy).     RECOMMENDATIONS:  1. The patient is at intermediate but acceptable cardiac risk for colonoscopy, revised cardiac index    score of 2.  2. The patient will continue her current medicines, including aspirin 81 mg daily, hydralazine 100 mg   twice a day, Coreg 12.5 mg b.i.d., Bumex 0.5 mg daily, amlodipine 10 mg, and simvastatin 20 mg.   She can hold aspirin  five  days prior to her colonoscopy in preparation for any potential biopsies   which would be needed.     Thank you for allowing me to participate in the care of your patient.     Janecia Palau E. Vonzella Nipple, MD    AEC/tumam    cc: Norton Blizzard MD  PATRICIA A NICHOLSON PA  AMELIA Marcine Matar MD     eca  ____________________________  Cleda Clarks  [id number] Education ICD-10: I10 MedlinePlus Connect results for ICD-10 I10  Return Visit  15 MIN 6 months  12 Lead ECG Today

## 2020-12-07 NOTE — Progress Notes (Signed)
[facility]  [address]. [place], [place] [address]     Jacqueline Weber    Date of Visit:  11/08/2017  Date of Birth: 09-13-1950  Age: 70 yrs.   Medical Record Number: [id number]  __  CURRENT DIAGNOSES     1. Hypertension (essential or benign  or malignant), I10  2. Acute NSTEMI, I21.4  3. CAD with unspecified angina pectoris, I25.119  4. Aortic valve insufficiency, I35.1  5. Dyspnea unspecified, R06.00  6. Carotid bruit, R09.89  7. Abnormal test-abnormal pharmacologic  stress nuclear study, R94.30  8. Presence Of Aortocoronary Bypass Graft, Z95.1  __  ALLERGIES     Penicillins, Hives and/or rash  Penicillins, Shortness of breath  __  MEDICATIONS     1.  aspirin 325 mg tablet, 1 po qd  2. bumetanide 1 mg tablet, 0.5 tablets po QD  3. Coreg 3.125 mg tablet, 1 po BID  4. ferrous sulfate 325 mg (65 mg iron) tablet, 1 po qd  5. hydralazine 100 mg tablet, 1 po bid  6. Incruse Ellipta 62.5  mcg/actuation powder for inhalation, Take as directed  7. montelukast 10 mg tablet, 1 po qhs  8. pantoprazole 40 mg tablet,delayed release, 1 po qd  9. Renvela 800 mg tablet, 2 tabs TID  10. Senna Lax 8.6 mg tablet, 2 po bid  11. simvastatin  20 mg tablet, 1 po qd  12. Tradjenta 5 mg tablet, 1 po qhs  13. tramadol 50 mg tablet, 1 po qpm PRN  14. Vitamin D3 2,000 unit tablet, 1 po bid  __  CHIEF COMPLAINT/REASON FOR VISIT   Followup of CAD with unspecified angina pectoris  __  HISTORY OF PRESENT ILLNESS  Jacqueline Weber is a 70 year old female who is here with her daughter  for follow up. She has a history of CAD status post non-STEMI in 2017 with subsequent bypass. She had three vessel disease at time of catheterization. She has now recently started dialysis since her last visit. She sees Dr. Ellery Plunk. After starting  dialysis her blood pressures have been a lot lower. She has actually discontinued clonidine, Norvasc and hydralazine and only takes Coreg as-needed.  __  PAST HISTORY      Past Medical  Illnesses: Diabetes mellitus-adult onset, Hyperlipidemia, Hypertension, ESRD;  Past Cardiac  Illnesses: cad s/p cabg, mild to mod AI; Infectious Diseases: No previous history of significant infectious  diseases.; Surgical Procedures: No previous surgical procedures.; Trauma History : No previous history of significant trauma.; Cardiology Procedures-Invasive: CABG 06/2016; Cardiology  Procedures-Noninvasive: MPI Single Isotope Lexiscan February 2017, Echocardiogram February 2017, Carotid artery duplex 03/2016, Echocardiogram September 2017, Echocardiogram July 2018;  Left Ventricular Ejection Fraction: LVEF of 75% documented via echocardiogram on 03/26/2017; Peripheral Vascular Procedures : Carotid NIVA July 2017    __  CARDIAC RISK FACTORS     Tobacco Abuse : has never used tobacco; Family History of Heart Disease: no family history of cardiovascular disease;  Hyperlipidemia: positive; Hypertension: positive;   Diabetes Mellitus: positive; Prior History of Heart Disease: negative;  Obesity: positive; Sedentary Life Style:negative;  [id number]; Menopausal:biological menopause  __   SOCIAL HISTORY    Alcohol Use: Does not use alcohol;  Smoking: Does not smoke; Never smoker ([id number]); Diet: Regular diet and Caffeine use-1-2 per day;  Exercise: No regular exercise;   __  PHYSICAL EXAMINATION    Vital Signs:   Blood Pressure:  150/68 Supine, Left arm, regular cuff    Weight: 130.00 lbs.  Height: 60.00"  BMI: 25   Pulse:  103/min. EKG       Constitutional: Cooperative, alert and oriented,well developed, well nourished, in no acute distress.   Head: normocephalic Eyes: conjunctivae and lids  normal ENT: No pallor or cyanosis Neck: no JVD  Chest: clear to auscultation bilaterally Cardiac: Regular rhythm, S1 normal, S2 normal, no murmurs  Abdomen: abdomen normal, abdomen soft, non-tender Peripheral Pulses: right radial +2  Extremities/Back: no edema present Neurological: oriented to time, person and place, affect  appropriate    __    Medications added today by the physician:  Coreg 3.125 mg tablet, 1 po BID, 180    IMPRESSIONS:  1. Coronary artery disease,  stable.  a. Status post non-ST-elevation myocardial infarction 05/2016.  b. Three vessel disease found at catheterization, status post coronary artery   bypass graft on October 9th, 2017. Saphenous vein graft to posterior   descending  artery, saphenous vein graft to circumflex, saphenous vein graft   to D1, left internal mammary artery to left anterior descending artery.  2. Newly started dialysis, followed by Dr. Ellery Plunk. Still takes Bumex though minimal urine output.   3. Echocardiogram 03/2017: Ejection fraction 75%, moderate aortic insufficiency, mild to moderate   mitral regurgitation.  4. Hypertension, recently has come off clonidine, Norvasc and Coreg after starting dialysis.  5. Dyslipidemia.   a. 09/2015: TC 148, TG 74, HDL 49, LDL 84 on simvastatin 20.    RECOMMENDATIONS:  1. Start Coreg 3.25 twice a day on a daily basis. She certainly should be on some form of beta   blocker with her history of a myocardial infarction.    2. She can take hydralazine as-needed.  3. Follow up with me in 4-6 months for a routine visit. At her next visit, we will then set her up for   repeat lipid testing. We will probably repeat an echocardiogram in 1-2 years to monitor her    moderate aortic regurgitation/mitral regurgitation.  4. I will defer to Nephrology about ongoing use of Bumex. From what it sounds like she has very   low urine output.    Thank you for allowing me to participate in the care of your patient.     Pj Zehner E. Vonzella Nipple, M.D.    AEC/tuld    cc: PATRICIA A NICHOLSON PA   AMELIA Darnell Level LEE MD    el   ____________________________  Cleda Clarks  [id number] Education ICD-10: R06.00 MedlinePlus Connect results  for ICD-10 R06.00  Diet mgmt edu, guidance and counseling TODAY  12 Lead ECG Today  Return Visit 15 MIN 4 months

## 2020-12-07 NOTE — Progress Notes (Signed)
[facility]  [employer]. [place], [place] [address]     Jacqueline Weber    Date of Visit:  03/19/2018  Date of Birth: 1951-02-14  Age: 70 yrs.   Medical Record Number: [id number]  __  CURRENT DIAGNOSES     1. Hypertension (essential or benign  or malignant), I10  2. Acute NSTEMI, I21.4  3. CAD with unspecified angina pectoris, I25.119  4. Aortic valve insufficiency, I35.1  5. Dyspnea unspecified, R06.00  6. Carotid bruit, R09.89  7. Abnormal test-abnormal pharmacologic  stress nuclear study, R94.30  8. Presence Of Aortocoronary Bypass Graft, Z95.1  __  ALLERGIES     Penicillins, Hives and/or rash  Penicillins, Shortness of breath  __  MEDICATIONS     1.  aspirin 325 mg tablet, 1 po qd  2. tramadol 50 mg tablet, 1 po qpm PRN  3. Vitamin D3 2,000 unit tablet, 1 po bid  4. ferrous sulfate 325 mg (65 mg iron) tablet, 1 po qd  5. pantoprazole 40 mg tablet,delayed release, 1 po qd  6. simvastatin  20 mg tablet, 1 po qd  7. Renvela 800 mg tablet, 2 tabs TID  8. Tradjenta 5 mg tablet, 1 po qhs  9. Incruse Ellipta 62.5 mcg/actuation powder for inhalation, Take as directed  10. Coreg 3.125 mg tablet, 1 po BID  11. Advair Diskus  100 mcg-50 mcg/dose powder for inhalation, as directed  12. albuterol sulfate 2.5 mg/3 mL (0.083 %) solution for nebulization, prn  __  CHIEF COMPLAINT/REASON FOR VISIT   Followup of CAD with unspecified angina pectoris  __  HISTORY OF PRESENT ILLNESS  Jacqueline Weber is a 70 year old female with a history of CAD  and now end-stage renal disease, on dialysis. She generally feels well without chest pain, pressure, palpitations, dizziness, lightheadedness, or syncope.   __  PAST HISTORY      Past Medical Illnesses: Diabetes mellitus-adult onset, Hyperlipidemia, Hypertension, ESRD;  Past Cardiac  Illnesses: cad s/p cabg, mild to mod AI; Infectious Diseases: No previous history of significant infectious  diseases.; Surgical Procedures: No previous surgical procedures.;  Trauma History : No previous history of significant trauma.; Cardiology Procedures-Invasive: CABG 06/2016; Cardiology  Procedures-Noninvasive: MPI Single Isotope Lexiscan February 2017, Echocardiogram February 2017, Carotid artery duplex 03/2016, Echocardiogram September 2017, Echocardiogram July 2018;  Left Ventricular Ejection Fraction: LVEF of 75% documented via echocardiogram on 03/26/2017; Peripheral Vascular Procedures : Carotid NIVA July 2017    __  CARDIAC RISK FACTORS     Tobacco Abuse : has never used tobacco; Family History of Heart Disease: no family history of cardiovascular disease;  Hyperlipidemia: positive; Hypertension: positive;   Diabetes Mellitus: positive; Prior History of Heart Disease: negative;  Obesity: positive; Sedentary Life Style:negative;  [id number]; Menopausal:biological menopause  __   SOCIAL HISTORY    Alcohol Use: Does not use alcohol;  Smoking: Does not smoke; Never smoker ([id number]); Diet: Regular diet and Caffeine use-1-2 per day;  Exercise: No regular exercise;   __  PHYSICAL EXAMINATION    Vital Signs:   Blood Pressure:  134/62 Sitting, Right arm, regular cuff    Weight: 130.00 lbs.   Height: 60.00"  BMI: 25.39   Pulse:  84/min. Apical Regular   Respirations: 18/min. regular and relaxed      Constitutional:  Cooperative, alert and oriented,well developed, well nourished, in no acute distress.  Head: normocephalic  Eyes: conjunctivae and lids normal ENT: No pallor or cyanosis  Neck: no JVD Chest: clear to  auscultation bilaterally  Cardiac: Regular rhythm, S1 normal, S2 normal, no murmurs Abdomen: abdomen normal, abdomen soft, non-tender  Peripheral Pulses: right radial +2 Extremities/Back: no edema present  Neurological: oriented to time, person and place, affect appropriate   __    Medications added today by the physician:     IMPRESSIONS:   1. CAD, stable.   a. Status post NSTEMI in September 2017.   b. Three-vessel disease at cath. Status post CABG June 12, 2016, SVG to    PDA, SVG to circumflex, SVG to D1, LIMA to LAD.   2. End-stage renal disease, on  dialysis. The patient saw Dr. Kate Sable.   3. Echo July 2018 revealing EF 75%, moderate AI, mild to moderate MR.  4. Hypertension, stable.   5. Dyslipidemia.   a. January 2017 LDL 84, on simvastatin 20 mg.     RECOMMENDATION:    Continue current cardiac medications. Lipids, LFTs, and followup in six to eight months. Copy dictation to PCP.    Coriann Brouhard E. Vonzella Nipple, MD     AEC/tutlc     cc: ALI ASSEFI MD   PATRICIA  A NICHOLSON PA  AMELIA Darnell Level LEE MD   ____________________________  Cleda Clarks  [id number] Education ICD-10: R06.00 MedlinePlus Connect  results for ICD-10 R06.00  Return Visit 15 MIN 8 months

## 2020-12-07 NOTE — Progress Notes (Signed)
[facility]  [address]queline [address]. [place], [place] [address]     Jacqueline Weber    Date of Visit:  07/25/2016  Date of Birth: May 29, 1951  Age: 70 yrs.   Medical Record Number: [id number]  __  CURRENT DIAGNOSES     1. Acute NSTEMI, I21.4  2. CAD  with unspecified angina pectoris, I25.119  3. Shortness Of Breath, R06.02  4. Chest Pain, Unspecified, R07.9  5. Carotid bruit, R09.89  6. Abnormal test-abnormal pharmacologic stress nuclear study, R94.30  7. 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 simvastatin 10 mg tablet, 1 po qhs  2. Flonase Allergy  Relief 50 mcg/actuation nasal spray,suspension, Take as Directed  3. amlodipine 10 mg tablet, 1 po qd  4. Januvia 25 mg tablet, 1 po  5. aspirin 325 mg tablet, 1 po qd  6. bumetanide 1 mg tablet, 0.5 tablets po bid  7. clonidine HCl  0.2 mg tablet, 1 po bid  8. Senna Lax 8.6 mg tablet, 2 po bid  9. tramadol 50 mg tablet, 1 po qpm PRN  10. Vitamin D3 2,000 unit tablet, 1 po bid  11. hydralazine 100 mg tablet, 1 po bid  12. ferrous sulfate 325 mg (65 mg iron) tablet,  1 po qd  13. pantoprazole 40 mg tablet,delayed release, 1 po qd  14. carvedilol 12.5 mg tablet, 1 po bid  __  CHIEF COMPLAINT/REASON FOR VISIT   Followup of Presence Of Aortocoronary Bypass Graft  __  HISTORY OF PRESENT ILLNESS  Jacqueline Weber is a pleasant 70 year old female who initially  presented with chest pain. She had a stress test after being seen at [facility] which revealed small area, mild intensity ischemia of the inferoapical wall. She is on maximum medical therapy despite discontinuation of chest pain. She underwent  a catheterization. Due to insurance issues catheterization was delayed. She ended up having a non-ST elevation MI with a troponin around 8 with anterolateral T wave inversions. She underwent a catheterization at which time revealed  three-vessel disease  and underwent three-vessel bypass surgery by Dr. Theo Dills. Recently she complained of a cough, fatigue and dizziness, was noted to have hyponatremia, sodium in the 120s. She was seen at [facility] by Dr. Odette Fraction from Nephrology who  felt this was either SIADH versus dehydration, nausea, vomiting and concomitant diuretic use. She states overall since leaving the hospital she complains of some mild fatigue, overall feels a little bit better in terms of energy level. She denies any  shortness of breath or chest pains, but she does get dizzy when going from sitting to standing.  __  PAST HISTORY      Past Medical Illnesses: Diabetes mellitus-adult onset, Hyperlipidemia, Hypertension;  Past Cardiac Illnesses : No previous history of cardiac disease.; Infectious Diseases: No previous history of significant infectious diseases.;  Surgical Procedures: No previous surgical procedures.; Trauma History: No previous history of significant  trauma.; Cardiology Procedures-Invasive: CABG 06/2016; Cardiology Procedures-Noninvasive : MPI Single Isotope Lexiscan February 2017, Echocardiogram February 2017, Carotid artery duplex 03/2016, Echocardiogram September 2017; Left Ventricular Ejection Fraction : LVEF of 65% documented via echocardiogram on 05/16/2016; Peripheral Vascular Procedures: Carotid NIVA July 2017     __  CARDIAC RISK FACTORS     Tobacco Abuse: has never used tobacco;  Family History of Heart Disease:  no family history of cardiovascular disease; Hyperlipidemia: positive;  Hypertension: positive;  Diabetes Mellitus: positive;  Prior History of Heart Disease: negative; Obesity: positive;  Sedentary Life Style:negative; [id number]; Menopausal :biological menopause  __  SOCIAL HISTORY    Alcohol Use : Does not use alcohol; Smoking: Does not smoke; Never smoker ([id number]); Diet : Regular diet and Caffeine use-1-2 per day; Exercise: No regular exercise;   __  PHYSICAL  EXAMINATION     Vital Signs:  Blood Pressure:  108/56 Sitting, Left arm, regular cuff  112/52 Sitting, Right arm,  regular cuff    Weight: 147.40 lbs.  Height: 60"   BMI: 29   Pulse: 64/min. Apical Regular        Constitutional: Cooperative, alert and oriented,well developed, well nourished, in no acute distress. Skin:  left leg + erythema Head: Normocephalic, normal hair pattern, no masses or tenderness  Eyes: conjunctivae and lids normal ENT: No pallor or  cyanosis Neck: no JVD, + bruit Chest : clear to auscultation bilaterally Cardiac: Regular rhythm, S1 normal, S2 normal, no murmurs Abdomen : abdomen normal, abdomen soft, non-tender Peripheral Pulses: right radial +2  Extremities/Back: +1 edema Neurological: No gross motor  or sensory deficits noted, affect appropriate, oriented to time, person and place.   __    Medications added today by the physician:  carvedilol  12.5 mg tablet, 1 po bid, 1        IMPRESSIONS:  1. CABG, SVG to PDA, SVG to CIRC, SVG to D1, LIMA to LAD, by Dr. Theo Dills, June 12, 2016.   2. NSTEMI, peak troponin 8 prior to bypass.  a. Echo, September 2017, EF 65%, left atrium moderately dilated, LA moderately  dilated, mild to moderate AI.  b. Nuclear stress test, February 2017, small-sized, mild intensity inferoapical   ischemia.  3. Chronic kidney disease, creatinine 2.2, baseline 1.9, August 2017.  a. Renal ultrasound, September 2017, no cortical thinning noted, no significant  stenosis. Creatinine, July 08, 2016, 2.0.  4. Hyponatremia, felt to be  secondary to SIADH versus nausea, vomiting, diuretic use, followed by  Dr. Odette Fraction.  5. Hypertension, now hypotensive with dizziness when going from sitting to standing.  6. Hyperlipidemia.  a. January 2017: TC 148, TG 74, HDL 49,  LDL 84.    RECOMMENDATIONS:   1. The patient will decrease carvedilol from 25 mg twice a day to 12.5 mg b.i.d. This may help  with her dizziness  when going from sitting to standing and her hypotension. I  will have her  come back in three weeks time to re-evaluate her blood pressure. If still running low and still  symptomatic, blood pressure less than 120/80 most of the time, we can decrease  hydralazine  from 100 b.i.d. to 75 b.i.d.  2. In terms of diuretic use, she is on Bumex 0.5 mg once a day. She has some lower extremity  edema. I will defer this to Dr. Ellery Plunk and Dr. Odette Fraction from Nephrology given her  underlying  hyponatremia. She will continue other cardiac medications including aspirin 325,  clonidine 0.2 b.i.d., hydralazine 100 b.i.d., simvastatin 10, and amlodipine 10.  3. I will see her back in four months time.    Thank you very much for allowing  me to participate in the care of your patient.     Calleigh Lafontant E. Vonzella Nipple, MD    AEC/tubbh    cc: Odette Fraction, MD  ALI ASSEFI, MD   Georgena Spurling PA  AMELIA Marcine Matar MD    eca   ____________________________  Cleda Clarks   [id number] Education ICD-10: I61.119 MedlinePlus Connect results for ICD-10 I25.119  B/P Check w/Nurse 3 weeks  Return Visit 15 MIN 4 months

## 2020-12-07 NOTE — Progress Notes (Signed)
[facility]   [address]. [address], [place] [address]     TELEMEDICINE VISIT       Jacqueline, Weber    Date of Visit:  02/03/2019  Date of Birth: 03-04-1951  Age: 70 yrs.   Medical Record Number: [id number]  __  CURRENT DIAGNOSES     1. Hypertension (essential or benign  or malignant), I10  2. Acute NSTEMI, I21.4  3. CAD with unspecified angina pectoris, I25.119  4. Aortic valve insufficiency, I35.1  5. Dyspnea unspecified, R06.00  6. Carotid bruit, R09.89  7. Abnormal test-abnormal pharmacologic  stress nuclear study, R94.30  8. Presence Of Aortocoronary Bypass Graft, Z95.1  __  ALLERGIES     Penicillins, Hives and/or rash  Penicillins, Shortness of breath  __  MEDICATIONS     1.  aspirin 325 mg tablet, 1 po qd  2. Vitamin D3 2,000 unit tablet, 1 po bid  3. ferrous sulfate 325 mg (65 mg iron) tablet, 1 po qd  4. pantoprazole 40 mg tablet,delayed release, 1 po qd  5. simvastatin 20 mg tablet, 1 po qd  6. Renvela  800 mg tablet, 2 tabs TID  7. Tradjenta 5 mg tablet, 1 po qhs  8. Incruse Ellipta 62.5 mcg/actuation powder for inhalation, Take as directed  9. Advair Diskus 100 mcg-50 mcg/dose powder for inhalation, as directed  10. albuterol sulfate  2.5 mg/3 mL (0.083 %) solution for nebulization, prn  11. Singulair 10 mg tablet, 1 po QD  12. Bisoprolol Fumarate 5 Mg Tablet, 1 po qd  13. glipizide 10 mg tablet, 1 po BID  14. olopatadine 0.1 % eye drops, 1 drops into affected eyes  qd  __  CHIEF COMPLAINT/REASON FOR VISIT  Followup of Acute NSTEMI, Followup of Dyspnea unspecified and Followup of Presence Of Aortocoronary Bypass  Graft  __  HISTORY OF PRESENT ILLNESS  The visit today was conducted via telemedicine due to COVID-19 precautions. The patient was in their home  and was informed and gave verbal consent to proceed.    Jacqueline Weber is a 70 year old female with a history of CAD, status post MI, and end-stage renal disease with three-vessel CAD, status post CABG. She generally feels  well since her last visit  without any chest pain, pressure, or palpitations. She does complain of a cough. She states the cough got slightly better after stopping her carvedilol. After starting bisoprolol she states the cough is still there.    __   PAST HISTORY     Past Medical Illnesses: Diabetes mellitus-adult onset, Hyperlipidemia, Hypertension,  ESRD;  Past Cardiac Illnesses: cad s/p cabg, mild to mod AI;  Infectious Diseases: No previous history of significant infectious diseases.; Surgical Procedures: No previous  surgical procedures.; Trauma History: No previous history of significant trauma.; Cardiology Procedures-Invasive : CABG 06/2016; Cardiology Procedures-Noninvasive: MPI Single Isotope Lexiscan February 2017, Echocardiogram February 2017, Carotid artery duplex 03/2016,  Echocardiogram September 2017, Echocardiogram July 2018, Echocardiogram March 2020; Left Ventricular Ejection Fraction: LVEF of 75% documented via echocardiogram  on 11/11/2018; Peripheral Vascular Procedures: Carotid NIVA July 2017     __  CARDIAC RISK FACTORS     Tobacco Abuse: has never used tobacco; Family History of Heart  Disease: no family history of cardiovascular disease; Hyperlipidemia: positive;  Hypertension: positive;  Diabetes Mellitus: positive;  Prior History of Heart Disease: negative; Obesity: positive;  Sedentary Life Style:negative; [id number]; Menopausal :biological menopause  __  SOCIAL HISTORY     Alcohol Use: Does not  use alcohol; Smoking: Does not smoke; Never smoker ([id number]);  Diet: Regular diet and Caffeine use-1-2 per day; Exercise: No regular exercise;   __   PHYSICAL EXAMINATION    Vital Signs:  Blood Pressure:  204/83 home cuff     Weight: 158.00 lbs.  Height: 60.00"  BMI:  30.85   Pulse: 70/min.       Constitutional:  Cooperative, alert and oriented,well developed, well nourished, in no acute distress.  Head: normocephalic  Eyes: conjunctivae and lids normal ENT: No pallor or cyanosis  Chest: no use  of accessory muscles Extremities/Back: no edema present  Neurological: oriented to time, person and place, affect appropriate   __    Medications added today by the physician:    IMPRESSIONS:   1. CAD, stable.   a. Status post NSTEMI  in September 2017.   b. Three-vessel disease at cath. Status post CABG June 12, 2016, SVG to   PDA, SVG to circumflex, SVG to D1, LIMA to LAD.   2. End-stage renal disease, on dialysis. The patient saw Dr. Kate Sable.   3. Echo 11/2018 revealing  EF 75%, moderate AI  4. Hypertension, BP elevated today [id number] SYStolic (pre HD), usually ok post HD.  5. Dyslipidemia.   a. December 2019 LDL 66, under good control.  6. Cough- slighlty improved after stoping Coreg, now back on Bisporolol     RECOMMENDATIONS:   1. stop Bisoprolol to see of coughing improves  2. Start Hydaralazine 25 bid, can increase to 50 bid in 1 week if needed  3. f/u in 2 weeks to reasses.    Of note, we discussed common causes of cough including reflux,  postnasal drip and allergic rhinitis. She is already on reflux medication addition she does not seem to [place] volume overloaded I wonder if this is a side effect of her beta-blocker.    Sheriece Jefcoat E. Vonzella Nipple, MD         AEC/tutlc   cc: Georgena Spurling PA  Nilda Calamity MD  Dr. Dahlia Bailiff

## 2020-12-07 NOTE — Progress Notes (Signed)
[facility]   [place]. [place], [place] [address]     TELEMEDICINE VISIT       Jacqueline Weber, Jacqueline Weber    Date of Visit:  02/18/2019  Date of Birth: 1950-12-27  Age: 70 yrs.   Medical Record Number: [id number]  __  CURRENT DIAGNOSES     1. Hypertension (essential or benign  or malignant), I10  2. Acute NSTEMI, I21.4  3. CAD with unspecified angina pectoris, I25.119  4. Aortic valve insufficiency, I35.1  5. Dyspnea unspecified, R06.00  6. Carotid bruit, R09.89  7. Abnormal test-abnormal pharmacologic  stress nuclear study, R94.30  8. Presence Of Aortocoronary Bypass Graft, Z95.1  __  ALLERGIES     Penicillins, Hives and/or rash  Penicillins, Shortness of breath  __  MEDICATIONS     1.  aspirin 325 mg tablet, 1 po qd  2. Vitamin D3 2,000 unit tablet, 1 po bid  3. ferrous sulfate 325 mg (65 mg iron) tablet, 1 po qd  4. pantoprazole 40 mg tablet,delayed release, 1 po qd  5. simvastatin 20 mg tablet, 1 po qd  6. Renvela  800 mg tablet, 2 tabs TID  7. Tradjenta 5 mg tablet, 1 po qhs  8. Incruse Ellipta 62.5 mcg/actuation powder for inhalation, Take as directed  9. Advair Diskus 100 mcg-50 mcg/dose powder for inhalation, as directed  10. albuterol sulfate  2.5 mg/3 mL (0.083 %) solution for nebulization, prn  11. Singulair 10 mg tablet, 1 po QD  12. glipizide 10 mg tablet, 1 po BID  13. olopatadine 0.1 % eye drops, 1 drops into affected eyes qd  14. hydralazine 25 mg tablet, 1 po bid   __  CHIEF COMPLAINT/REASON FOR VISIT  Followup of Acute NSTEMI and Followup of Presence Of Aortocoronary Bypass Graft  __   HISTORY OF PRESENT ILLNESS  The visit today was conducted via telemedicine due to COVID-19 precautions. The patient was in their home and was informed and gave verbal consent to proceed.       Jacqueline Weber is a 70 year old female with a history of CAD, status post MI, and end-stage renal disease with three-vessel CAD, status post CABG. She generally feels well since her last visit without any  chest pain, pressure, or palpitations. She does  complain of a cough. She states the cough got slightly better after stopping her carvedilol. After starting bisoprolol she states the cough is still there.    __  PAST HISTORY      Past Medical Illnesses: Diabetes mellitus-adult onset, Hyperlipidemia, Hypertension, ESRD;  Past Cardiac  Illnesses: cad s/p cabg, mild to mod AI; Infectious Diseases: No previous history of significant infectious  diseases.; Surgical Procedures: No previous surgical procedures.; Trauma History : No previous history of significant trauma.; Cardiology Procedures-Invasive: CABG 06/2016; Cardiology  Procedures-Noninvasive: MPI Single Isotope Lexiscan February 2017, Echocardiogram February 2017, Carotid artery duplex 03/2016, Echocardiogram September 2017, Echocardiogram July 2018, Echocardiogram March  2020; Left Ventricular Ejection Fraction: LVEF of 75% documented via echocardiogram on 11/11/2018;  Peripheral Vascular Procedures: Carotid NIVA July 2017    __  CARDIAC RISK FACTORS      Tobacco Abuse: has never used tobacco; Family History of Heart Disease: no family history of cardiovascular  disease; Hyperlipidemia: positive; Hypertension : positive;  Diabetes Mellitus: positive; Prior History of Heart Disease : negative; Obesity: positive; Sedentary Life Style :negative; [id number]; Menopausal:biological  menopause  __  SOCIAL HISTORY    Alcohol  Use: Does not use alcohol; Smoking:  Does not smoke; Never smoker ([id number]);  Diet: Regular diet and Caffeine use-1-2 per day; Exercise: No regular exercise;   __   PHYSICAL EXAMINATION    Vital Signs:  Blood Pressure:         Constitutional: Cooperative, alert and oriented,well developed, well nourished, in no acute distress.   Head: normocephalic Eyes: conjunctivae and lids normal  ENT: No pallor or cyanosis Chest: no use of accessory muscles  Extremities/Back: no edema present Neurological: oriented to time, person and place, affect  appropriate    __    Medications added today by the physician:        IMPRESSIONS:   1. CAD, stable.   a. Status post NSTEMI in September 2017.   b. Three-vessel disease at cath. Status post CABG June 12, 2016, SVG to   PDA, SVG  to circumflex, SVG to D1, LIMA to LAD.   2. ESRD, on HD. -Dr. Kate Sable.   3. Echo 11/2018 EF 75%, moderate AI  4. HTN  5. HLD   a. December 2019 LDL 66, under good control.  6. Cough- does not appear to be related to BB (no improvment  w/ trial of holding meds X 2 weeks)    RECOMMENDATIONS:   1. Restart Bisoprolol 5 daily, (coughing did not improve when we held BB for 2 weeks)  2. Stop Hydaralazine  3. F/u in 2 weeks to reasses.    Of note, we discussed common  causes of cough including reflux, postnasal drip and allergic rhinitis. She is already on reflux medication addition she does not seem to be volume overloaded.    Tayley Mudrick E. Vonzella Nipple, MD       cc: PATRICIA A NICHOLSON PA  Nilda Calamity  MD

## 2020-12-07 NOTE — H&P (Signed)
[facility]  [id number] [place], [place] [address]     [name]    Date of Visit:  11/18/2015  Date of Birth: 11/02/1950  Age: 70 yrs.   Medical Record Number: [id number]  Referring Physician: Lorel Monaco PA, PATRICIA A  __   CURRENT DIAGNOSES     1. Shortness Of Breath, R06.02  2. Chest Pain, Unspecified, R07.9  3. Carotid bruit, R09.89  4. Abnormal test-abnormal pharmacologic stress nuclear study, R94.30   __  ALLERGIES    Penicillins, Hives and/or rash  Penicillins, Shortness of breath   __  MEDICATIONS     1. aspirin 325 mg tablet, 1 po qd  2. carvedilol 25 mg tablet, 1 po bid  3. famotidine 20 mg tablet, 1 po qd  4.  Allergy Relief (fluticasone) 50 mcg/actuation nasal spray,suspension, as directed  5. glimepiride 4 mg tablet, 1 po qd  6. meclizine 25 mg tablet, 1 po tid  7. metformin 1,000 mg tablet, 1 po bid  8. simvastatin 10 mg tablet, 1 po qhs   9. isosorbide mononitrate ER 30 mg tablet,extended release 24 hr, 1 po qd  10. amlodipine 10 mg tablet, 1 po qd  __  CHIEF COMPLAINT/REASON FOR VISIT   Followup of Shortness Of Breath  __  HISTORY OF PRESENT ILLNESS  This is a pleasant 31 -year-old female who is accompanied by her daughter who  was recently seen at [facility] for an episode of chest pain. At that time she was scheduled to have outpatient ischemic evaluation with echocardiogram and stress test. Her stress test shows a small area of inferior apical ischemia and inferior  diaphragmatic attenuation. The patient is here for follow-up,. She does complain of shortness of breath, especially going up one flight of stairs and also after walking one block. The patient has had chest pains in the past but no recent episodes of pain  since her discharge from the hospital.   __  PAST HISTORY     Past Medical Illnesses : Diabetes mellitus-adult onset, Hyperlipidemia, Hypertension;  Past Cardiac Illnesses: No previous history  of cardiac disease.; Infectious Diseases: No  previous history of significant infectious diseases.;  Surgical Procedures: No previous surgical procedures.; Trauma History: No previous history of significant  trauma.; Cardiology Procedures-Invasive: No previous interventional or invasive cardiology procedures.;  Cardiology Procedures-Noninvasive: MPI Single Isotope Lexiscan February 2017, Echocardiogram February 2017; Left Ventricular Ejection Fraction : LVEF of 70% documented via echocardiogram on 10/20/2015    __  CARDIAC RISK FACTORS      Tobacco Abuse: has never used tobacco; Family History of Heart Disease: no family history of cardiovascular  disease; Hyperlipidemia: positive; Hypertension: positive;   Diabetes Mellitus: positive; Prior History of Heart Disease : negative; Obesity: positive; Sedentary Life Style :negative; [id number]; Menopausal:biological menopause   __  SOCIAL HISTORY    Alcohol Use: Does not use  alcohol; Smoking: Does not smoke; Never smoker ([id number]); Diet : Regular diet and Caffeine use-1-2 per day; Exercise: No regular exercise;   __  PHYSICAL EXAMINATION     Vital Signs:  Blood Pressure:  152/82 Sitting, Left arm, regular cuff  152/82 Sitting, Right arm,  regular cuff    Weight: 151.00 lbs.  Height: 60"   BMI: 29   Pulse: 72/min. Apical Regular   Respirations:  18/min. regular and relaxed      Constitutional: Cooperative, alert and oriented,well developed,  well nourished, in no acute distress. Skin: Warm and dry to touch, no  apparent skin lesions, or masses noted.  Head: Normocephalic, normal hair pattern, no masses or tenderness Eyes : EOMS Intact, PERRL, conjunctivae and lids normal. Funduscopic exam and visual fields not performed. ENT:  Ears, Nose and throat reveal no gross abnormalities. No pallor or cyanosis. Dentition good. Neck: no  JVD, + bruit Chest: Normal symmetry, no tenderness to palpation, normal respiratory excursion, no intercostal retraction, no use of accessory muscles,  normal diaphragmatic excursion, clear  to auscultation and percussion. Cardiac: Regular rhythm, S1 normal, S2 normal, no murmurs  Abdomen: Abdomen soft, bowel sounds normoactive, no masses, no hepatosplenomegaly, non-tender, no bruits Peripheral Pulses : The femoral, popliteal, dorsalis pedis, and posterior tibial pulses are full and equal bilaterally with no bruits auscultated. Extremities/Back : no edema present Neurological: No gross motor or sensory deficits noted, affect appropriate, oriented  to time, person and place.   __    Medications added today by the physician:  amlodipine 10 mg tablet, 1 po qd, 30  isosorbide mononitrate  ER 30 mg tablet,extended release 24 hr, 1 po qd, 30    PREVIOUS ECG:  ECG done 10/01/2015 shows normal sinus rhythm, normal intervals, no significant  ST or T wave changes. This has been signed and submitted for scanning into the patient's chart.    IMPRESSIONS:  1. Chest pain, recent hospitalization  at [facility].  2. Shortness of breath, dyspnea on exertion.   3. Nuclear stress test 10/28/2015: Small sized, mild intensity inferior apical ischemia (also had   prominent subdiaphragmatic attenuation artifact of the inferior wall).  EF 72%.   4. Echo 10/20/2015: EF 78%, concentric LVH, grade I diastolic dysfunction, mild AI.  5. HTN, blood pressure elevated at 152/82 today.  6. Hyperlipidemia.  a. 09/2015: TC 148, TRIG 74, HDL 49, LDL 84 (all at goal).      RECOMMENDATIONS:  1. The patient will increase amlodipine from 5 mg once a day to 10 mg once a day for blood   pressure control.  2. We will add Imdur 30 mg once a day given her dyspnea on exertion  which may be her anginal   equivalent and small area of ischemia in the inferior wall. Stress test was also complicated by   diaphragmatic attenuation.  3. I will see the patient back in 2-3 weeks and we will attempt to treat her dyspnea on  exertion   with nitrate therapy. If she improves and does well will continue medical therapy. If she does   not do well on  medical therapy can consider catheterization for symptomatic relief.  4. Patient has a carotid bruit. Will schedule  her for a carotid ultrasound.    Jessika Rothery E. Vonzella Nipple, MD    AEC/ds    cc: PATRICIA A NICHOLSON  PA    EL   ____________________________  TODAYS ORDERS  Diet mgmt edu, guidance and counseling  TODAY  Return Visit 15 MIN 3 weeks  Carotid Duplex - [facility] First Available

## 2020-12-07 NOTE — Progress Notes (Signed)
[facility]   [address], [place] [address]     TELEMEDICINE VISIT       FALLAN, VERVILLE    Date of Visit:  05/20/2019  Date of Birth: Aug 15, 1951  Age: 70 yrs.   Medical Record Number: [id number]  __  CURRENT DIAGNOSES     1. Hypertension (essential or benign  or malignant), I10  2. Acute NSTEMI, I21.4  3. CAD with unspecified angina pectoris, I25.119  4. Aortic valve insufficiency, I35.1  5. Dyspnea unspecified, R06.00  6. Carotid bruit, R09.89  7. Abnormal test-abnormal pharmacologic  stress nuclear study, R94.30  8. Presence Of Aortocoronary Bypass Graft, Z95.1  __  ALLERGIES     Penicillins, Hives and/or rash  Penicillins, Shortness of breath  __  MEDICATIONS     1.  aspirin 325 mg tablet, 1 po qd  2. Vitamin D3 2,000 unit tablet, 1 po bid  3. ferrous sulfate 325 mg (65 mg iron) tablet, 1 po qd  4. pantoprazole 40 mg tablet,delayed release, 1 po qd  5. simvastatin 20 mg tablet, 1 po qd  6. Renvela  800 mg tablet, 2 tabs TID  7. Tradjenta 5 mg tablet, 1 po qhs  8. Incruse Ellipta 62.5 mcg/actuation powder for inhalation, Take as directed  9. Advair Diskus 100 mcg-50 mcg/dose powder for inhalation, as directed  10. albuterol sulfate  2.5 mg/3 mL (0.083 %) solution for nebulization, prn  11. Singulair 10 mg tablet, 1 po QD  12. glipizide 10 mg tablet, 1 po BID  13. olopatadine 0.1 % eye drops, 1 drops into affected eyes qd  14. bisoprolol fumarate 5 mg tablet, 1 po  q am  __  CHIEF COMPLAINT/REASON FOR VISIT  Followup of Aortic valve insufficiency and Followup of Hypertension (essential or benign or malignant)   __  HISTORY OF PRESENT ILLNESS  The visit today was conducted via telemedicine due to COVID-19 precautions. The patient was in their home and was informed  and gave verbal consent to proceed.    70 year old female with a history of CAD, ESRD, status post MI, three-vessel CAD status post CABG in 2017. She generally feels well since her last visit without any chest  pain, pressure, or palpitations. She  unfortunately still continues to complain of a cough. The patient denies any lower extreme edema, orthopnea or dyspnea. She states that she has an upcoming bronchoscopy with Dr. Humphrey Rolls this week. Of note we did trial holding her beta-blocker which did not  help improve her cough.   __  PAST HISTORY     Past Medical Illnesses : Diabetes mellitus-adult onset, Hyperlipidemia, Hypertension, ESRD;  Past Cardiac Illnesses: cad s/p  cabg, mild to mod AI; Infectious Diseases: No previous history of significant infectious diseases.;  Surgical Procedures: No previous surgical procedures.; Trauma History: No previous history of significant  trauma.; Cardiology Procedures-Invasive: CABG 06/2016; Cardiology Procedures-Noninvasive : MPI Single Isotope Lexiscan February 2017, Echocardiogram February 2017, Carotid artery duplex 03/2016, Echocardiogram September 2017, Echocardiogram July 2018, Echocardiogram March 2020; Left Ventricular  Ejection Fraction: LVEF of 75% documented via echocardiogram on 11/11/2018; Peripheral Vascular Procedures : Carotid NIVA July 2017    __  CARDIAC RISK FACTORS     Tobacco Abuse : has never used tobacco; Family History of Heart Disease: no family history of cardiovascular disease;  Hyperlipidemia: positive; Hypertension: positive;   Diabetes Mellitus: positive; Prior History of Heart Disease: negative;  Obesity: positive; Sedentary Life Style:negative;  [id number]; Menopausal:biological menopause  __   SOCIAL HISTORY    Alcohol Use: Does not use alcohol;  Smoking: Does not smoke; Never smoker ([id number]); Diet: Regular diet and Caffeine use-1-2 per day;  Exercise: No regular exercise;   __  PHYSICAL EXAMINATION    Vital Signs:   Blood Pressure:  169/84 Sitting, Right arm, regular cuff    Weight: 153.00 lbs.   Height: 60.00"  BMI: 29.88   Pulse:  108/min.       Constitutional: Cooperative, alert and oriented,well developed, well nourished, in no acute distress.    Head: normocephalic Eyes: conjunctivae and lids  normal ENT: No pallor or cyanosis Chest: no use  of accessory muscles Extremities/Back: no edema present via video chat Neurological : oriented to time, person and place, affect appropriate   __    Medications added today by the physician:  bisoprolol fumarate 5 mg tablet, 1 po q am, 90      IMPRESSIONS:   1. Pre op prior to bronchoscopy.  2. CAD, stable.    a. NSTEMI in September 2017.   b. 3v disease at cath. s/p CABG June 12, 2016, SVG to   PDA, SVG to circumflex, SVG to D1, LIMA to LAD.   3. ESRD, on HD. -Dr. Kate Sable.   4. Echo 11/2018 EF 75%, moderate AI  5. HTN  6. HLD    a. 08/2018 LDL 66, under good control.  7. Cough- does not appear to be related to BB (no improvment w/ trial of holding BB X 2 weeks)  a. Discussed common causes of cough including reflux, postnasal drip and allergic rhinitis. She is already  on reflux medication addition she does not seem to be volume overloaded. She has an upcoming bronchoscopy    RECOMMENDATIONS:   1. Cont Bisoprolol 5 daily  2. Will take an extra tablet of bisoprolol q 24 hrs for blood pressures greater  than [id number] systolic.  3. EKG as part of her pre op w/u  4. Patient is considered at intermediate but acceptable cardiac risk to proceed with bronchoscopy.  5. F/u in 4 months    Laiba Fuerte E. Vonzella Nipple, MD       cc: Georgena Spurling  PA  Nilda Calamity MD  Gerhard Perches MD- Pulmonology  [facility] presurgical services fax number [phone]

## 2020-12-07 NOTE — Progress Notes (Signed)
[facility]   [address] [place], [place] [address]     [name]    Date of Visit:  10/28/2018  Date of Birth: 09/23/1950  Age: 70 yrs.   Medical Record Number: [id number]  __  CURRENT DIAGNOSES     1. Hypertension (essential or benign  or malignant), I10  2. Acute NSTEMI, I21.4  3. CAD with unspecified angina pectoris, I25.119  4. Aortic valve insufficiency, I35.1  5. Dyspnea unspecified, R06.00  6. Carotid bruit, R09.89  7. Abnormal test-abnormal pharmacologic  stress nuclear study, R94.30  8. Presence Of Aortocoronary Bypass Graft, Z95.1  __  ALLERGIES     Penicillins, Hives and/or rash  Penicillins, Shortness of breath  __  MEDICATIONS     1.  aspirin 325 mg tablet, 1 po qd  2. Vitamin D3 2,000 unit tablet, 1 po bid  3. ferrous sulfate 325 mg (65 mg iron) tablet, 1 po qd  4. pantoprazole 40 mg tablet,delayed release, 1 po qd  5. simvastatin 20 mg tablet, 1 po qd  6. Renvela  800 mg tablet, 2 tabs TID  7. Tradjenta 5 mg tablet, 1 po qhs  8. Incruse Ellipta 62.5 mcg/actuation powder for inhalation, Take as directed  9. Coreg 3.125 mg tablet, 1 po BID  10. Advair Diskus 100 mcg-50 mcg/dose powder for inhalation,  as directed  11. albuterol sulfate 2.5 mg/3 mL (0.083 %) solution for nebulization, prn  12. Singulair 10 mg tablet, 1 po QD  13. glipizide 5 mg tablet, 1 po BID  __  CHIEF COMPLAINT/REASON  FOR VISIT  Followup of CAD with unspecified angina pectoris and Followup of Presence Of Aortocoronary Bypass Graft  __  HISTORY OF PRESENT ILLNESS   Jacqueline Weber is a 70 year old female with a history of CAD, status post MI, and end-stage renal disease with three-vessel CAD, status post CABG. She generally feels well since her last visit without any chest pain, pressure, or palpitations.   __   PAST HISTORY     Past Medical Illnesses: Diabetes mellitus-adult onset, Hyperlipidemia, Hypertension,  ESRD;  Past Cardiac Illnesses: cad s/p cabg, mild to mod AI;  Infectious Diseases: No  previous history of significant infectious diseases.; Surgical Procedures: No previous  surgical procedures.; Trauma History: No previous history of significant trauma.; Cardiology Procedures-Invasive : CABG 06/2016; Cardiology Procedures-Noninvasive: MPI Single Isotope Lexiscan February 2017, Echocardiogram February 2017, Carotid artery duplex 03/2016,  Echocardiogram September 2017, Echocardiogram July 2018; Left Ventricular Ejection Fraction: LVEF of 75% documented via echocardiogram on 03/26/2017;  Peripheral Vascular Procedures: Carotid NIVA July 2017    __  CARDIAC RISK FACTORS      Tobacco Abuse: has never used tobacco; Family History of Heart Disease: no family history of cardiovascular  disease; Hyperlipidemia: positive; Hypertension : positive;  Diabetes Mellitus: positive; Prior History of Heart Disease : negative; Obesity: positive; Sedentary Life Style :negative; [id number]; Menopausal:biological  menopause  __  SOCIAL HISTORY    Alcohol  Use: Does not use alcohol; Smoking: Does not smoke; Never smoker ([id number]);  Diet: Regular diet and Caffeine use-1-2 per day; Exercise: No regular exercise;   __   PHYSICAL EXAMINATION    Vital Signs:  Blood Pressure:  142/70 Sitting, Right arm, large  cuff    Weight: 153.00 lbs.  Height: 60.00"   BMI: 29.88   Pulse: 82/min. Apical        Constitutional: Cooperative, alert and oriented,well developed, well nourished, in no acute distress.   Head:  normocephalic Eyes: conjunctivae and lids normal  ENT: No pallor or cyanosis Neck: no JVD Chest : clear to auscultation bilaterally Cardiac: Regular rhythm, S1 normal, S2 normal, no murmurs Abdomen : abdomen normal, abdomen soft, non-tender Peripheral Pulses: right radial +2, left arm fistula Extremities/Back : no edema present Neurological: oriented to time, person and place, affect appropriate   __    Medications added today by the physician:     IMPRESSIONS:   1. CAD, stable.   a. Status post NSTEMI in September 2017.    b. Three-vessel disease at cath. Status post CABG June 12, 2016, SVG to   PDA, SVG to circumflex, SVG to D1, LIMA to LAD.   2. End-stage renal disease, on  dialysis. The patient saw Dr. Kate Sable.   3. Echo July 2018 revealing EF 75%, moderate AI, mild to moderate MR.  4. Hypertension, stable.   5. Dyslipidemia.   a. December 2019 LDL 66, under good control.    RECOMMENDATIONS:    1. The patient is complaining of a chronic cough. She has a bronchoscopy scheduled with Dr. Humphrey Rolls later this month. I did ask her to hold Coreg for one week to see if this helps improve her cough. It may potentially be an exacerbating factor. I will see  her back around the summertime. She is planning on moving to [place] after that.   2. Echocardiogram in the interim to monitor her aortic insufficiency, which was moderate back in 2018. Copy dictation to PCP.    Sapir Lavey E. Dresean Beckel, MD      addednum 11/11/2018- cough improved after holding coreg. will trial bisoprolol 5 (switch from nonn selective to selective BB)  AEC/tutlc     cc: ALI ASSEFI MD   Gerhard Perches MD   PATRICIA A NICHOLSON PA  AMELIA Darnell Level LEE MD  ____________________________   TODAYS ORDERS  2D, color flow, doppler [facility]  [id number] Education ICD-10: I10 MedlinePlus Connect results for ICD-10 I10  Return Visit 15 MIN 5-6 months

## 2020-12-07 NOTE — Progress Notes (Signed)
[facility]  [address]. [place], [place] [address]     [name]    Date of Visit:  05/29/2016  Date of Birth: 08-31-1951  Age: 70 yrs.   Medical Record Number: [id number]  __  CURRENT DIAGNOSES     1. Acute NSTEMI, I21.4  2. CAD  with unspecified angina pectoris, I25.119  3. Shortness Of Breath, R06.02  4. Chest Pain, Unspecified, R07.9  5. Carotid bruit, R09.89  6. Abnormal test-abnormal pharmacologic stress nuclear study, R94.30  __   ALLERGIES    Penicillins, Hives and/or rash  Penicillins, Shortness of breath  __   MEDICATIONS     1. simvastatin 10 mg tablet, 1 po qhs  2. Vitamin D3 1,000 unit tablet, 1 po qd  3. Flonase Allergy Relief 50 mcg/actuation nasal spray,suspension, Take as Directed  4.  carvedilol 3.125 mg tablet, 1 po bid  5. Calcium 600 600 mg calcium (1,500 mg) tablet, 1 po qd  6. Aspirin Low Dose 81 mg tablet,delayed release, 1 po qd  7. isosorbide mononitrate ER 120 mg tablet,extended release 24 hr, 1 po qd  8. clonidine  HCl 0.1 mg tablet, 1 po qd  9. hydralazine 100 mg tablet, 1 po BID  10. amlodipine 10 mg tablet, 1 po qd  11. losartan 25 mg tablet, 1 po qd  12. Januvia 25 mg tablet, 1 po  13. Plavix 75 mg tablet, 1 po qd  __   CHIEF COMPLAINT/REASON FOR VISIT  Followup of Acute NSTEMI  __  HISTORY OF PRESENT ILLNESS   This is a 70 year old female who has complaints of chest pains, underwent a stress test after being admitted to [facility] initially. The stress test revealed a small area of mild intensity ischemia of the inferoapical wall. She has been on  maximal medical therapy despite this complaint of chest pain. She actually was scheduled for a catheterization, which was held off because of insurance issues. In the interim, she ended up going back to [facility] with chest pain, noted to  have a non-ST-elevation myocardial infarction, troponin of 8 with anterolateral T-wave inversions. She is now chest-pain free and has been  discharged from the hospital.   __  PAST HISTORY      Past Medical Illnesses: Diabetes mellitus-adult onset, Hyperlipidemia, Hypertension;  Past Cardiac Illnesses : No previous history of cardiac disease.; Infectious Diseases: No previous history of significant infectious diseases.;  Surgical Procedures: No previous surgical procedures.; Trauma History: No previous history of significant  trauma.; Cardiology Procedures-Invasive: No previous interventional or invasive cardiology procedures.;  Cardiology Procedures-Noninvasive: MPI Single Isotope Lexiscan February 2017, Echocardiogram February 2017, Carotid artery duplex 03/2016, Echocardiogram September 2017;  Left Ventricular Ejection Fraction: LVEF of 65% documented via echocardiogram on 05/16/2016; Peripheral Vascular Procedures : Carotid NIVA July 2017    __  CARDIAC RISK FACTORS      Tobacco Abuse: has never used tobacco; Family History of Heart Disease: no family history of cardiovascular  disease; Hyperlipidemia: positive; Hypertension: positive;   Diabetes Mellitus: positive; Prior History of Heart Disease : negative; Obesity: positive; Sedentary Life Style :negative; [id number]; Menopausal:biological menopause   __  SOCIAL HISTORY    Alcohol Use: Does not use  alcohol; Smoking: Does not smoke; Never smoker ([id number]); Diet : Regular diet and Caffeine use-1-2 per day; Exercise: No regular exercise;   __  PHYSICAL EXAMINATION     Vital Signs:  Blood Pressure:  122/64 Sitting, Left arm, regular cuff  130/60 Sitting, Right arm,  regular cuff    Weight: 141.80 lbs.  Height: 60"   BMI: 27   Pulse: 75/min. Apical        Constitutional: Cooperative, alert and oriented,well developed, well nourished, in no acute distress. Skin:  Warm and dry to touch, no apparent skin lesions, or masses noted. Head: Normocephalic, normal hair pattern,  no masses or tenderness Eyes: conjunctivae and lids normal ENT : No pallor or cyanosis Neck: no JVD, + bruit  Chest: clear to  auscultation bilaterally Cardiac: Regular rhythm, S1 normal, S2 normal, no murmurs  Abdomen: Abdomen soft, bowel sounds normoactive, no masses, no hepatosplenomegaly, non-tender, no bruits Peripheral Pulses : right radial +2 Extremities/Back: trace edema Neurological : No gross motor or sensory deficits noted, affect appropriate, oriented to time, person and place.   __    Medications added today by the physician:   Plavix 75 mg tablet, 1 po qd, 30        ECG:   ECG shows normal sinus rhythm, anterolateral T-wave inversions, which are new compared to  her previous ECG.    IMPRESSIONS:   1. Non-ST-elevation myocardial infarction, peak troponin 8.  a. New anterolateral T-wave inversions.   b. Echo September 0000000, EF 123456, diastolic dysfunction, mild LVH, LA  moderately dilated, mild-to-moderate AI, mild MR.   c. Nuclear stress test February 2017, small size, mild intensity inferoapical  ischemia, EF 72%.  2. Elevated creatinine.   a. August 2017, 1.9, January 2017, 0.9 (creatinine in the hospital was ranging  between 2-2.2, fairly stable).  b. Renal artery ultrasound September 2017. Overall, no significant cortical  thinning noted. Renal artery ultrasound revealed no  significant stenosis.  3. Mild CHF in the setting of non-ST-elevation myocardial infarction.  4. Hypertension, stable.  5. Hyperlipidemia.  a. January 2017, TC 148, TG 74, HDL 49, LDL 84.      RECOMMENDATIONS:   The patient will start Plavix 75 in addition to her current medications, aspirin 81, Imdur 120, carvedilol 25 b.i.d., clonidine 0.1 b.i.d., hydralazine 100 mg b.i.d., simvastatin 10,  losartan 25. She will see me back in two weeks' time after she sees Dr. Ellery Plunk. If creatinine is fairly stable, then we will set her up for cardiac catheterization, also have her check her creatinine on Wednesday of this week, in a couple of days.     Freddie Apley, MD     [id number]    cc: Georgena Spurling PA     eca    ____________________________  Cleda Clarks  Return Visit 15 MIN 2 weeks  12 Lead ECG Today

## 2020-12-07 NOTE — Progress Notes (Signed)
[facility]  [address]. [place], [place] [address]     [name]    Date of Visit:  05/03/2016  Date of Birth: 11/02/1950  Age: 70 yrs.   Medical Record Number: [id number]  __  CURRENT DIAGNOSES     1. CAD with unspecified angina pectoris,  I25.119  2. Shortness Of Breath, R06.02  3. Chest Pain, Unspecified, R07.9  4. Carotid bruit, R09.89  5. Abnormal test-abnormal pharmacologic stress nuclear study, R94.30  __  ALLERGIES     Penicillins, Hives and/or rash  Penicillins, Shortness of breath  __  MEDICATIONS     1.  metformin 1,000 mg tablet, 1 po bid  2. simvastatin 10 mg tablet, 1 po qhs  3. Vitamin D3 1,000 unit tablet, 1 po qd  4. Flonase Allergy Relief 50 mcg/actuation nasal spray,suspension, Take as Directed  5. carvedilol 3.125 mg tablet, 1  po bid  6. Calcium 600 600 mg calcium (1,500 mg) tablet, 1 po qd  7. iron ER 325 mg (65 mg iron) capsule,extended release, 1 po qd  8. Aspirin Low Dose 81 mg tablet,delayed release, 1 po qd  9. isosorbide mononitrate ER 120 mg tablet,extended  release 24 hr, 1 po qd  10. hydralazine 50 mg tablet, 1 po BID  11. clonidine HCl 0.2 mg tablet, PRN if SBP is over 180  __  CHIEF COMPLAINT/REASON FOR VISIT   Followup of CAD with unspecified angina pectoris  __  HISTORY OF PRESENT ILLNESS  A 70 year old female accompanied by her daughter comes in with  complaints of chest pain. She recently underwent a stress test after being admitted to [facility] for chest pain. She has a small area of mild intensity ischemia of the inferior wall. She is on maximum medical therapy and despite this is complaining  of ongoing chest pain and shortness of breath. She also has had severely elevated blood pressures in our office, systolics over 123456, at home in the 160s-170s.  __  PAST HISTORY      Past Medical Illnesses: Diabetes mellitus-adult onset, Hyperlipidemia, Hypertension;  Past Cardiac Illnesses : No previous history of cardiac disease.;  Infectious Diseases: No previous history of significant infectious diseases.;  Surgical Procedures: No previous surgical procedures.; Trauma History: No previous history of significant  trauma.; Cardiology Procedures-Invasive: No previous interventional or invasive cardiology procedures.;  Cardiology Procedures-Noninvasive: MPI Single Isotope Lexiscan February 2017, Echocardiogram February 2017, Carotid artery duplex 03/2016; Left Ventricular  Ejection Fraction: LVEF of 70% documented via echocardiogram on 10/20/2015; Peripheral Vascular Procedures : Carotid NIVA July 2017    __  CARDIAC RISK FACTORS      Tobacco Abuse: has never used tobacco; Family History of Heart Disease: no family history of cardiovascular  disease; Hyperlipidemia: positive; Hypertension: positive;   Diabetes Mellitus: positive; Prior History of Heart Disease : negative; Obesity: positive; Sedentary Life Style :negative; [id number]; Menopausal:biological menopause   __  SOCIAL HISTORY    Alcohol Use: Does not use  alcohol; Smoking: Does not smoke; Never smoker ([id number]); Diet : Regular diet and Caffeine use-1-2 per day; Exercise: No regular exercise;   __  PHYSICAL EXAMINATION     Vital Signs:  Blood Pressure:  182/72 Sitting, Left arm, large cuff  200/70 Sitting, Right arm,  large cuff    Weight: 149.60 lbs.  Height: 60.0"   BMI: 29   Pulse: 78/min. Apical Regular        Constitutional: Cooperative, alert and oriented,well developed, well nourished,  in no acute distress. Skin:  Warm and dry to touch, no apparent skin lesions, or masses noted. Head: Normocephalic, normal hair pattern,  no masses or tenderness Eyes: conjunctivae and lids normal ENT : Ears, Nose and throat reveal no gross abnormalities. No pallor or cyanosis. Dentition good. Neck:  no JVD, + bruit Chest: clear to auscultation bilaterally Cardiac : Regular rhythm, S1 normal, S2 normal, no murmurs Abdomen: Abdomen soft, bowel sounds normoactive, no masses, no hepatosplenomegaly,  non-tender, no bruits  Peripheral Pulses: right radial +2 Extremities/Back:  trace edema Neurological: No gross motor or sensory deficits noted, affect appropriate, oriented to time,  person and place.   __    Medications added today by the physician:    IMPRESSIONS:   1. Chest pain with abnormal nuclear stress test findings.  a. February 2017, nuclear stress test, small-sized, mild intensity inferoapical   ischemia, EF 72%.  2. Shortness of breath, somewhat improved on nitrate therapy.  a. February  2017, echo, EF 70%, concentric LVH, grade I diastolic dysfunction,   mild AI.  3. Hypertension, severely elevated.  4. Hyperlipidemia.  a. January 2017, TC 148, TRIG 74, HDL 49, LDL 84 (all at goal).     RECOMMENDATION:   The patient will continue maximum medical therapy. I will increase her hydralazine from 50 b.i.d. to 100 b.i.d. and place her on clonidine 0.1 mg once a day. We will schedule her for  a cardiac cath at [facility]. She is currently an [facility] patient. In terms of medical therapy, she will also continue aspirin 81, Imdur 120, carvedilol 3.125 b.i.d. and simvastatin 10. I will have her follow-up with me after catheterization  done.    Amillia Biffle E. Vonzella Nipple, MD    AEC/tubbh    cc: PATRICIA A NICHOLSON PA     EL   ____________________________  Cleda Clarks  [id number] Education ICD-10: R06.02 MedlinePlus Connect results for ICD-10 R06.02  Cath/Possible  [facility]

## 2020-12-07 NOTE — Progress Notes (Signed)
[facility]  [address] [place], [place] [address]     [name]    Date of Visit:  03/21/2017  Date of Birth: [birth year]  Age: 70 yrs.   Medical Record Number: [id number]  __  CURRENT DIAGNOSES     1. Hypertension (essential or benign  or malignant), I10  2. Acute NSTEMI, I21.4  3. CAD with unspecified angina pectoris, I25.119  4. Dyspnea unspecified, R06.00  5. Carotid bruit, R09.89  6. Abnormal test-abnormal pharmacologic stress nuclear study, R94.30  7. Presence  Of Aortocoronary Bypass Graft, Z95.1  8. Aortic valve insufficiency, I35.1  __  ALLERGIES     Penicillins, Hives and/or rash  Penicillins, Shortness of breath  __  MEDICATIONS     1.  aspirin 325 mg tablet, 1 po qd  2. Senna Lax 8.6 mg tablet, 2 po bid  3. tramadol 50 mg tablet, 1 po qpm PRN  4. Vitamin D3 2,000 unit tablet, 1 po bid  5. hydralazine 100 mg tablet, 1 po bid  6. ferrous sulfate 325 mg (65 mg iron)  tablet, 1 po qd  7. pantoprazole 40 mg tablet,delayed release, 1 po qd  8. amlodipine 10 mg tablet, 1 po qd  9. simvastatin 20 mg tablet, 1 po qd  10. clonidine HCl 0.2 mg tablet, 1 po bid  11. carvedilol 12.5 mg tablet, 1 po bid   12. bumetanide 1 mg tablet, 0.5 tablets po QD  __  CHIEF COMPLAINT/REASON FOR VISIT  Followup of Dyspnea unspecified  __   HISTORY OF PRESENT ILLNESS  The patient is a 70 year old female who is here with her daughter for an office follow up. She has a history of coronary artery disease status post NSTEMI in 2017 and subsequent  bypass. The patient had three-vessel disease by catheterization. From a cardiac standpoint she has not had any recent complaints of chest pain. She does have chronic kidney disease and follows with Dr. Ellery Plunk. Her daughter states that she is close  to starting dialysis. She states that over the last three to four months she has been complaining of a cough and some dyspnea when lying flat and also with exertion. She does not complain of any worsening  lower extremity edema. She has some mild lower  extremity edema from before previously noted but not anything that has worsened at this point   __  PAST HISTORY      Past Medical Illnesses: Diabetes mellitus-adult onset, Hyperlipidemia, Hypertension;  Past Cardiac Illnesses : cad s/p cabg, mild to mod AI; Infectious Diseases: No previous history of significant infectious diseases.;  Surgical Procedures: No previous surgical procedures.; Trauma History: No previous history of significant  trauma.; Cardiology Procedures-Invasive: CABG 06/2016; Cardiology Procedures-Noninvasive : MPI Single Isotope Lexiscan February 2017, Echocardiogram February 2017, Carotid artery duplex 03/2016, Echocardiogram September 2017; Left Ventricular Ejection Fraction : LVEF of 65% documented via echocardiogram on 05/16/2016; Peripheral Vascular Procedures: Carotid NIVA July 2017     __  CARDIAC RISK FACTORS     Tobacco Abuse: has never used tobacco;  Family History of Heart Disease: no family history of cardiovascular disease; Hyperlipidemia: positive;  Hypertension: positive;  Diabetes Mellitus: positive;  Prior History of Heart Disease: negative; Obesity: positive;  Sedentary Life Style:negative; [id number]; Menopausal :biological menopause  __  SOCIAL HISTORY    Alcohol Use : Does not use alcohol; Smoking: Does not smoke; Never smoker ([id number]); Diet : Regular diet and Caffeine use-1-2 per day; Exercise: No regular  exercise;   __  PHYSICAL EXAMINATION     Vital Signs:  Blood Pressure:  162/70 Sitting, Right arm, regular cuff     Weight: 128.00 lbs.  Height: 60.00"  BMI:  25   Pulse: 65/min. Apical       Constitutional:  Cooperative, alert and oriented,well developed, well nourished, in no acute distress. Head: Normocephalic,  normal hair pattern, no masses or tenderness Eyes: conjunctivae and lids normal ENT : No pallor or cyanosis Neck: 5 -7 cm H20 at 45 degrees,  JVD not elevated Chest: clear to auscultation bilaterally Cardiac :  Regular rhythm, S1 normal, S2 normal, no murmurs Abdomen: abdomen normal, abdomen soft, non-tender  Peripheral Pulses: right radial +2 Extremities/Back:  +1 edema Neurological: No gross motor or sensory deficits noted, affect appropriate, oriented to time,  person and place.   __    Medications added today by the physician:    IMPRESSIONS:    1. Dyspnea and orthopnea.   a. On examination today, no evidence of jugular venous distention 5-8 cm of   water at 45 degrees. Lungs appear to be clear, although she does feel more   dyspneic when lying flat. She has some very mild lower extremity  edema,   which is chronic in nature and not recently worsened.   2. Chronic kidney disease - creatinine around 2 in August of 2017. The daughter says, close to   starting dialysis by Dr. Ellery Plunk.  3. Coronary artery disease - stable.   a. Coronary artery bypass grafting on June 12, 2016, with saphenous vein   graft to posterior descending, saphenous vein graft to circumflex, saphenous   vein graft to diagonal 1, left internal mammary artery to left anterior   descending.    b. NSTEMI in September of 2017. Peak troponin of 8.   c. Echocardiogram in September of 2017, ejection fraction 65%, mild to   moderate aortic insufficiency.   4. Hyponatremia.   5. Hypertension.  6. Dyslipidemia - In January of 2017,  total cholesterol 148, triglycerides 74, HDL 49, LDL 84 on   simvastatin 20 mg.     RECOMMENDATIONS:  1. Her symptoms of dyspnea and orthopnea  do not clearly seem to be related to volume overload   based on my examination today. Her daughter also states that she had a chest x-ray report   from May of 2018 presumably around the same time she had similar symptoms. She states   that  she was having similar symptoms at that time as well. Atelectasis without any other   evidence of volume overload (pulmonary vasculature without normal limits).   2. At this point we will leave her on the same dose of Bumex and I will request   records from   Dr. Ellery Plunk. Perhaps her symptoms may be related to worsening renal dysfunction. She   also has mild to moderate aortic insufficiency on an echocardiogram from last year and I would   like to repeat an echocardiogram now  and make sure that her valve issues have not worsened,   perhaps contributing to her symptoms. I will see her back in a few weeks.     Thank you very much for allowing [place] to participate in the care of this patient.     Daishawn Lauf E. Vonzella Nipple,  M.D.     AEC/tubks    cc: PATRICIA A NICHOLSON PA   AMELIA Darnell Level LEE MD    shj  ____________________________  Cleda Clarks  2D, color flow, doppler First Available  [id number] Education ICD-10: I10 MedlinePlus Connect results for ICD-10 [place] from PCP  Return Visit 15 MIN 2-3 weeks

## 2020-12-07 NOTE — Progress Notes (Signed)
[facility]  [address] [place], [place] [address]     [name]    Date of Visit:  07/04/2016  Date of Birth: May 21, 1951  Age: 70 yrs.   Medical Record Number: [id number]  __  CURRENT DIAGNOSES     1. Acute NSTEMI, I21.4  2. CAD  with unspecified angina pectoris, I25.119  3. Shortness Of Breath, R06.02  4. Chest Pain, Unspecified, R07.9  5. Carotid bruit, R09.89  6. Abnormal test-abnormal pharmacologic stress nuclear study, R94.30  7. 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 simvastatin 10 mg tablet, 1 po qhs  2. Flonase Allergy  Relief 50 mcg/actuation nasal spray,suspension, Take as Directed  3. amlodipine 10 mg tablet, 1 po qd  4. Januvia 25 mg tablet, 1 po  5. aspirin 325 mg tablet, 1 po qd  6. bumetanide 1 mg tablet, 0.5 tablets po bid  7. carvedilol 25  mg tablet, 1 po bid  8. clonidine HCl 0.2 mg tablet, 1 po bid  9. hydralazine 100 mg tablet, 1 po tid  10. Senna Lax 8.6 mg tablet, 2 po bid  11. tramadol 50 mg tablet, 1 po qpm PRN  12. Vitamin D3 2,000 unit tablet, 1 po bid   __  CHIEF COMPLAINT/REASON FOR VISIT  Followup of CAD with unspecified angina pectoris  __   HISTORY OF PRESENT ILLNESS  The patient is a very pleasant 70 year old female accompanied by her daughter with history of chest pains. She underwent a stress test after being seen at [facility],  which revealed a small area of mild-intensity ischemia of the inferoapical wall. She was on maximal medical therapy and despite this had complaints of chest pain. She underwent a catheterization. Due to insurance issues catheterization was delayed. She  did end up with a non ST-segment elevation myocardial infarction with a troponin around 8.0 and anterolateral T-wave inversions. She then underwent catheterization which revealed three-vessel disease and underwent three-vessel bypass surgery  with Dr.  Theo Dills, and is here for followup. She does complain of fatigue, chills, runny nose, and cough.   __  PAST HISTORY      Past Medical Illnesses: Diabetes mellitus-adult onset, Hyperlipidemia, Hypertension;  Past Cardiac Illnesses : No previous history of cardiac disease.; Infectious Diseases: No previous history of significant infectious diseases.;  Surgical Procedures: No previous surgical procedures.; Trauma History: No previous history of significant  trauma.; Cardiology Procedures-Invasive: CABG 06/2016; Cardiology Procedures-Noninvasive : MPI Single Isotope Lexiscan February 2017, Echocardiogram February 2017, Carotid artery duplex 03/2016, Echocardiogram September 2017; Left Ventricular Ejection Fraction : LVEF of 65% documented via echocardiogram on 05/16/2016; Peripheral Vascular Procedures: Carotid NIVA July 2017     __  CARDIAC RISK FACTORS     Tobacco Abuse: has never used tobacco;  Family History of Heart Disease: no family history of cardiovascular disease; Hyperlipidemia: positive;  Hypertension: positive;  Diabetes Mellitus: positive;  Prior History of Heart Disease: negative; Obesity: positive;  Sedentary Life Style:negative; [id number]; Menopausal :biological menopause  __  SOCIAL HISTORY    Alcohol Use : Does not use alcohol; Smoking: Does not smoke; Never smoker ([id number]); Diet : Regular diet and Caffeine use-1-2 per day; Exercise: No regular exercise;   __  PHYSICAL EXAMINATION     Vital Signs:  Blood Pressure:  180/80 Sitting, Left arm, regular cuff  180/80 Sitting, Right arm,  regular cuff    Weight: 140.00 lbs.  Height: 60"   BMI: 27   Pulse: 73/min. Apical        Constitutional: Cooperative, alert and oriented,well developed, well nourished, in no acute distress. Skin:  left leg + erythema Head: Normocephalic, normal hair pattern, no masses or tenderness  Eyes: conjunctivae and lids normal ENT: No pallor or  cyanosis Neck: no JVD, + bruit Chest : clear to auscultation  bilaterally Cardiac: Regular rhythm, S1 normal, S2 normal, no murmurs Abdomen : Abdomen soft, bowel sounds normoactive, no masses, no hepatosplenomegaly, non-tender, no bruits Peripheral Pulses:  right radial +2 Extremities/Back: trace edema Neurological : No gross motor or sensory deficits noted, affect appropriate, oriented to time, person and place.   __    Medications added today by the physician:     ECG:  ECG shows sinus rhythm, baseline artifact, no atrial fibrillation.    IMPRESSIONS:   1.  Coronary artery disease, status post coronary artery bypass graft by Dr. Theo Dills on   June 12, 2016.  a. Saphenous vein graft to posterior descending artery, saphenous vein graft to   circumflex, saphenous vein graft to D1, LIMA to left  anterior descending.  2. NSTEMI. Peak troponin 8.0 prior to bypass.  a. Echocardiogram September of 2017: EF 65%, LA moderately dilated, mild   to moderate AI.  b. Nuclear stress test February of 2017: Small-sized, mild-intensity, inferoapical    ischemia.   3. Elevated creatinine 2.0-2.2 at baseline, 1.9 in August of 2017.  a. Renal artery ultrasound September of 2017: No cortical thinning noted, no   significant stenosis; creatinine on discharge 2.3 in October of 2017.  4. Hypertension,  elevated.  5. Hyperlipidemia.  a. January of 2017: TC 148; TG 74; HDL 49; LDL 84.  6. Recent symptoms of feeling cold, chills, and runny nose.    RECOMMENDATION:   The patient is recommended to follow up with Dr. Camila Li in regards to her symptoms. There is no obvious large effusion on my exam or abnormal breath sounds. She does have some mild erythema of the left leg, but she states that this is improving. She  complains of upper respiratory symptoms; perhaps this is a URI. I think it would be prudent at this point to rule out bacteremia, get blood cultures and a UA. I will have the patient follow up with Dr. Truman Hayward at this point and see me back in three weeks'  time. At that point if she is  feeling better we will readdress her blood pressure and send her to cardiac rehab. Current medications include aspirin 325, amlodipine 10, Bumex 0.5 b.i.d., Coreg 25 b.i.d., clonidine 0.2 b.i.d., hydralazine 100 three times  a day, and Zocor 10.    Jannifer Fischler E. Vonzella Nipple, MD    AEC/tutbh    cc: PATRICIA A NICHOLSON  PA  AMELIA Marcine Matar MD    EL   ____________________________  Cleda Clarks  [id number]  Education ICD-10: I21.4 MedlinePlus Connect results for ICD-10 I21.4  12 Lead ECG Today  Return Visit 15 MIN 3 weeks  Diet_mgmt_edu,_guidance_and_counseling TODAY

## 2020-12-07 NOTE — Progress Notes (Signed)
[facility]  [address] [place], [place] [address]     [name]    Date of Visit:  04/24/2016  Date of Birth: 11/02/1950  Age: 70 yrs.   Medical Record Number: [id number]  __  CURRENT DIAGNOSES     1. CAD with unspecified angina pectoris,  I25.119  2. Shortness Of Breath, R06.02  3. Chest Pain, Unspecified, R07.9  4. Carotid bruit, R09.89  5. Abnormal test-abnormal pharmacologic stress nuclear study, R94.30  __  ALLERGIES     Penicillins, Hives and/or rash  Penicillins, Shortness of breath  __  MEDICATIONS     1.  metformin 1,000 mg tablet, 1 po bid  2. simvastatin 10 mg tablet, 1 po qhs  3. Vitamin D3 1,000 unit tablet, 1 po qd  4. Flonase Allergy Relief 50 mcg/actuation nasal spray,suspension, Take as Directed  5. carvedilol 3.125 mg tablet, 1  po bid  6. Calcium 600 600 mg calcium (1,500 mg) tablet, 1 po qd  7. iron ER 325 mg (65 mg iron) capsule,extended release, 1 po qd  8. Aspirin Low Dose 81 mg tablet,delayed release, 1 po qd  9. clonidine HCl 0.1 mg tablet, 1 po qd   10. isosorbide mononitrate ER 120 mg tablet,extended release 24 hr, 1 po qd  11. hydralazine 25 mg tablet, 1 po BID  __  CHIEF COMPLAINT/REASON FOR VISIT   Followup of Chest Pain, Unspecified  __  HISTORY OF PRESENT ILLNESS  This is a very pleasant 70 year old female accompanied here by her daughter.  The patient underwent a stress test with [place] in the office after being admitted to [facility] for chest pains. It showed a small area of mild intensity ischemia of the inferior wall, which is being treated medically. The patient is still complaining  of chest pain with activity and with rest. She has been on higher doses of nitrates and beta-blocker since her last visit.  ____  PAST HISTORY      Past Medical Illnesses: Diabetes mellitus-adult onset, Hyperlipidemia, Hypertension;  Past Cardiac Illnesses : No previous history of cardiac disease.; Infectious Diseases: No previous history of  significant infectious diseases.;  Surgical Procedures: No previous surgical procedures.; Trauma History: No previous history of significant  trauma.; Cardiology Procedures-Invasive: No previous interventional or invasive cardiology procedures.;  Cardiology Procedures-Noninvasive: MPI Single Isotope Lexiscan February 2017, Echocardiogram February 2017, Carotid artery duplex 03/2016; Left Ventricular  Ejection Fraction: LVEF of 70% documented via echocardiogram on 10/20/2015; Peripheral Vascular Procedures : Carotid NIVA July 2017    __  CARDIAC RISK FACTORS      Tobacco Abuse: has never used tobacco; Family History of Heart Disease: no family history of cardiovascular  disease; Hyperlipidemia: positive; Hypertension: positive;   Diabetes Mellitus: positive; Prior History of Heart Disease : negative; Obesity: positive; Sedentary Life Style :negative; [id number]; Menopausal:biological menopause   __  SOCIAL HISTORY    Alcohol Use: Does not use  alcohol; Smoking: Does not smoke; Never smoker ([id number]); Diet : Regular diet and Caffeine use-1-2 per day; Exercise: No regular exercise;   __  PHYSICAL EXAMINATION     Vital Signs:  Blood Pressure:  208/82 Sitting, Left arm, regular cuff  198/82 Sitting, Right arm,  regular cuff    Weight: 149.20 lbs.  Height: 60"   BMI: 29   Pulse: 64/min. Apical Regular        Constitutional: Cooperative, alert and oriented,well developed, well nourished, in no acute distress. Skin:  Warm and  dry to touch, no apparent skin lesions, or masses noted. Head: Normocephalic, normal hair pattern,  no masses or tenderness Eyes: EOMS Intact, PERRL, conjunctivae and lids normal. Funduscopic exam and visual fields not performed.  ENT: Ears, Nose and throat reveal no gross abnormalities. No pallor or cyanosis. Dentition good. Neck : no JVD, + bruit Chest: clear to auscultation bilaterally  Cardiac: Regular rhythm, S1 normal, S2 normal, no murmurs Abdomen: Abdomen soft, bowel sounds normoactive,  no  masses, no hepatosplenomegaly, non-tender, no bruits Peripheral Pulses: right radial +2  Extremities/Back: +2 LE edema Neurological: No gross  motor or sensory deficits noted, affect appropriate, oriented to time, person and place.   __    Medications added today by the physician:     IMPRESSIONS:   1. Chest pain.   a. ECG in the office August 2017, normal sinus rhythm, no significant ST or T-wave changes.  b. October 28, 2015 nuclear stress test: small sized mild intensity  inferoapical ischemia, EF 72%  2. Shortness of breath, slightly improved on nitrate therapy, currently on Imdur 120.  1. Echo October 25, 2015, EF 70%, concentric LVH, grade I diastolic dysfunction, mild AI.  2. Hypertension, severely elevated  at 190s over 80s.  3. Hyperlipidemia. September 24, 2015: TC 148, TRIG 74, HDL 49, LDL 84 (all at goal).     RECOMMENDATIONS:   1. The patient  will continue Imdur 120. We will also increase hydralazine from 25 b.i.d. to 50 b.i.d. given   elevated blood pressure. She will continue Coreg 3.25 twice a day, aspirin 81 and clonidine.   2. I will see the patient back next time for a catheterization  at [facility]. The patient has a   small area of ischemia. Overall, low risk findings on stress test, but will identify if any major issues are   noted at time of cardiac catheterization.    Jacqueline Apley, MD     Tid:  [id number]    cc: Jacqueline Spurling PA    SJ   ____________________________   Cleda Clarks  [id number] Education ICD-10: R06.02 MedlinePlus Connect results for ICD-10 R06.02  Left Heart Cath SS with [facility] First Available

## 2020-12-07 NOTE — Progress Notes (Signed)
[facility]  [address] [place], [place] [address]     [name]    Date of Visit:  04/10/2016  Date of Birth: 11/02/1950  Age: 70 yrs.   Medical Record Number: [id number]  __  CURRENT DIAGNOSES     1. CAD with unspecified angina pectoris,  I25.119  2. Shortness Of Breath, R06.02  3. Chest Pain, Unspecified, R07.9  4. Carotid bruit, R09.89  5. Abnormal test-abnormal pharmacologic stress nuclear study, R94.30  __  ALLERGIES     Penicillins, Hives and/or rash  Penicillins, Shortness of breath  __  MEDICATIONS     1.  metformin 1,000 mg tablet, 1 po bid  2. simvastatin 10 mg tablet, 1 po qhs  3. Amlodipine 10 Mg Tablet, 1 po qd  4. isosorbide mononitrate ER 60 mg tablet,extended release 24 hr, 1 po qd  5. Vitamin D3 1,000 unit tablet, 1 po qd  6.  Flonase Allergy Relief 50 mcg/actuation nasal spray,suspension, Take as Directed  7. carvedilol 3.125 mg tablet, 1 po bid  8. Calcium 600 600 mg calcium (1,500 mg) tablet, 1 po qd  9. iron ER 325 mg (65 mg iron) capsule,extended release, 1  po qd  10. Aspirin Low Dose 81 mg tablet,delayed release, 1 po qd  11. clonidine HCl 0.1 mg tablet, 1 po qd  __  CHIEF COMPLAINT/REASON FOR VISIT   Followup of CAD with unspecified angina pectoris  __  HISTORY OF PRESENT ILLNESS  OFFICE PROGRESS  NOTE  This is a 70 year old female who was seen at [facility] for chest pain. She underwent a stress test with [place] in the office and noted a small area of ischemia of the inferior wall. She  is being treated medically. She still complains of chest pain at rest and with exertion. She also notes increased lower extremity edema.   __  PAST HISTORY      Past Medical Illnesses: Diabetes mellitus-adult onset, Hyperlipidemia, Hypertension;  Past Cardiac Illnesses : No previous history of cardiac disease.; Infectious Diseases: No previous history of significant infectious diseases.;  Surgical Procedures: No previous surgical procedures.; Trauma History: No  previous history of significant  trauma.; Cardiology Procedures-Invasive: No previous interventional or invasive cardiology procedures.;  Cardiology Procedures-Noninvasive: MPI Single Isotope Lexiscan February 2017, Echocardiogram February 2017, Carotid artery duplex 03/2016; Left Ventricular  Ejection Fraction: LVEF of 70% documented via echocardiogram on 10/20/2015; Peripheral Vascular Procedures : Carotid NIVA July 2017    __  CARDIAC RISK FACTORS     Tobacco Abuse : has never used tobacco; Family History of Heart Disease: no family history of cardiovascular disease;  Hyperlipidemia: positive; Hypertension: positive;   Diabetes Mellitus: positive; Prior History of Heart Disease: negative;  Obesity: positive; Sedentary Life Style:negative;  [id number]; Menopausal:biological menopause  __   SOCIAL HISTORY    Alcohol Use: Does not use alcohol;  Smoking: Does not smoke; Never smoker ([id number]); Diet: Regular diet and Caffeine use-1-2 per day;  Exercise: No regular exercise;   __  PHYSICAL EXAMINATION    Vital Signs:   Blood Pressure:  160/72 Sitting, Right arm, regular cuff  160/74 Sitting, Left arm, regular cuff    Weight:  148.80 lbs.  Height: 60"  BMI: 29    Pulse: 75/min. Apical       Constitutional: Cooperative, alert and oriented,well developed,  well nourished, in no acute distress. Skin: Warm and dry to touch, no apparent skin lesions, or masses noted.  Head: Normocephalic, normal  hair pattern, no masses or tenderness Eyes: EOMS Intact, PERRL, conjunctivae  and lids normal. Funduscopic exam and visual fields not performed. ENT: Ears, Nose and throat reveal no gross abnormalities. No pallor or cyanosis. Dentition  good. Neck: no JVD, + bruit Chest: Normal symmetry,  no tenderness to palpation, normal respiratory excursion, no intercostal retraction, no use of accessory muscles, normal diaphragmatic excursion, clear to auscultation and percussion. Cardiac : Regular rhythm, S1 normal, S2 normal, no murmurs  Abdomen: Abdomen soft, bowel sounds normoactive, no masses, no hepatosplenomegaly, non-tender, no  bruits Peripheral Pulses: The femoral, popliteal, dorsalis pedis, and posterior tibial pulses are full and equal bilaterally with no bruits auscultated.  Extremities/Back: +2 LE edema Neurological: No gross motor or sensory deficits noted, affect appropriate,  oriented to time, person and place.   __    Medications added today by the physician:    ECG:  Normal sinus rhythm, normal intervals.  No significant ST or T wave changes.     IMPRESSIONS:  1. Chest pain.  a. 10/28/2015, nuclear stress, small sized mild intensity inferoapical ischemia,   ejection fraction 72%.   2. Shortness of breath, improving on nitrate therapy,  Imdur 60 mg.   3. Echocardiogram, 10/25/2015, ejection fraction 70%, concentric left ventricular hypertrophy, grade 1   diastolic dysfunction, and mild aortic insufficiency.   4. Hypertension, stage I elevation.  5. Hyperlipidemia, 09/24/2015,  TC 148, trig 74, HDL 49, LDL 84 (all at goal).     RECOMMENDATIONS:  I will increase her Imdur from 60 mg to 120 mg. We will discontinue her amlodipine 10 mg as she has lower extremity edema, and I started her on hydralazine 25 mg b.i.d. I  will have the patient undergo BMP to assess her renal function in anticipation for possible catheterization. She will continue Coreg 3.125 mg twice a day, aspirin 81 mg, and clonidine. I will see the patient back in two weeks' time to reassess her symptoms.  We are treating her underlying mild CAD medically for now. If she continues to have chest pain on maximal medical therapy, we will then consider cardiac catheterization for symptomatic relief.     Donnesha Karg E. Vonzella Nipple, MD    AEC/tumam     cc: PATRICIA A. NICHOLSON PA    AM   ____________________________   Cleda Clarks  [id number] Education ICD-10: R09.89 MedlinePlus Connect results for ICD-10 R09.89  12 Lead ECG Today  Return Visit 15 MIN 2 weeks  Basic Metabolic Panel  First Available

## 2020-12-09 ENCOUNTER — Encounter: Payer: Self-pay | Admitting: Physical Therapy

## 2020-12-09 ENCOUNTER — Ambulatory Visit: Payer: Medicare HMO | Attending: Nephrology | Admitting: Physical Therapy

## 2020-12-09 ENCOUNTER — Other Ambulatory Visit: Payer: Self-pay

## 2020-12-09 DIAGNOSIS — R296 Repeated falls: Secondary | ICD-10-CM | POA: Insufficient documentation

## 2020-12-09 DIAGNOSIS — R262 Difficulty in walking, not elsewhere classified: Secondary | ICD-10-CM | POA: Insufficient documentation

## 2020-12-09 DIAGNOSIS — M6281 Muscle weakness (generalized): Secondary | ICD-10-CM | POA: Diagnosis present

## 2020-12-09 DIAGNOSIS — R2689 Other abnormalities of gait and mobility: Secondary | ICD-10-CM | POA: Diagnosis present

## 2020-12-09 NOTE — Therapy (Signed)
[address] [place], [place], [address] Phone: [phone]   Fax:  [phone]  Physical Therapy Treatment  Patient Details  Name: Shawna Lowe MRN: [id number] Date of Birth: 1951-07-19 Referring Provider (PT): Moshe Cipro   Encounter Date: 12/09/2020   PT End of Session - 12/09/20 1610    Visit Number 8    Date for PT Re-Evaluation 01/04/21    PT Start Time 1528    PT Stop Time 1610    PT Time Calculation (min) 42 min    Activity Tolerance Patient tolerated treatment well;Patient limited by fatigue    Behavior During Therapy [facility] for tasks assessed/performed           History reviewed. No pertinent past medical history.  History reviewed. No pertinent surgical history.  There were no vitals filed for this visit.   Subjective Assessment - 12/09/20 1530    Subjective No new complaints or falls reported    Currently in Pain? No/denies                             [facility] Adult PT Treatment/Exercise - 12/09/20 0001      High Level Balance   High Level Balance Activities Side stepping;Backward walking;Marching forwards;Marching backwards    High Level Balance Comments close supervision-CGA with balance ex's      Knee/Hip Exercises: Aerobic   Nustep L5 LE/UE x 29mn    Other Aerobic UBE L1 x2 min each      Knee/Hip Exercises: Machines for Strengthening   Cybex Knee Extension 5# 2x10 BLE    Cybex Knee Flexion 15# 2x10 BLE      Knee/Hip Exercises: Standing   Heel Raises Both;10 reps;1 set    Heel Raises Limitations 3#    Knee Flexion Both;1 set;10 reps    Knee Flexion Limitations 3#    Hip Flexion Both;1 set;10 reps    Hip Flexion Limitations 3#    Hip Abduction Both;1 set;10 reps    Abduction Limitations 3#    Hip Extension Both;1 set;10 reps    Extension Limitations 3#    Other Standing Knee Exercises alt step taps no HR 6" stair x10 B CGA      Knee/Hip Exercises: Seated    Sit to Sand 2 sets;10 reps;without UE support   3# dumbbell chest press                   PT Short Term Goals - 12/02/20 0910      PT SHORT TERM GOAL #1   Title Pt will be I with initial HEP    Baseline reports intermittently performing HEP    Time 2    Period Weeks    Status On-going             PT Long Term Goals - 12/02/20 01740     PT LONG TERM GOAL #1   Title Pt will demo TUG <15 sec with LRAD and gait WFL    Baseline 15.76 sec with SPC    Time 8    Period Weeks    Status Partially Met      PT LONG TERM GOAL #2   Title Pt will demo gait x100 ft around clinic with LRAD and gait WFL    Baseline gait x200 ft with SPC no LOB but weaving with gait    Time 8    Period Weeks  Status Partially Met      PT LONG TERM GOAL #3   Title Pt will report no additional falls    Baseline reports no additional falls    Time 8    Period Weeks    Status Partially Met      PT LONG TERM GOAL #4   Title Pt will demo Berg Balance score improved to 52/56    Time 8    Period Weeks    Status On-going                 Plan - 12/09/20 1610    Clinical Impression Statement Pt tolerated up to 10 minutes of standing ex's this rx without requiring a break. Still needs more frequent rest breaks d/t increased SOB and coughing with exercise. Cues for increased step length with backwards walking. Close supervision-CGA with all high level balance ex's. No new info regarding transplant per daughter.    PT Treatment/Interventions ADLs/Self Care Home Management;Neuromuscular re-education;Balance training;Therapeutic exercise;Therapeutic activities;Functional mobility training;Gait training;Stair training;Patient/family education    PT Next Visit Plan balance ex's, endurance, gait training. Follow up regarding any new information regarding transplant    Consulted and Agree with Plan of Care Patient           Patient will benefit from skilled therapeutic intervention in order to  improve the following deficits and impairments:  Abnormal gait,Decreased range of motion,Difficulty walking,Decreased endurance,Decreased safety awareness,Decreased activity tolerance,Decreased balance,Decreased mobility,Decreased strength,Postural dysfunction  Visit Diagnosis: Repeated falls  Other abnormalities of gait and mobility  Muscle weakness (generalized)  Difficulty in walking, not elsewhere classified     Problem List Patient Active Problem List   Diagnosis Date Noted  . Asthmatic bronchitis , chronic ([place]) 12/01/2020  . Chronic cough 11/30/2020   Amador Cunas, PT, DPT Donald Prose Jerin Franzel 12/09/2020, 4:12 PM  [address] [place], [place], [address] Phone: [phone]   Fax:  [phone]  Name: Shawna Lowe MRN: [id number] Date of Birth: 03-07-1951

## 2020-12-15 ENCOUNTER — Ambulatory Visit
Admission: RE | Admit: 2020-12-15 | Discharge: 2020-12-15 | Disposition: A | Discharge status: Home / Self Care | Payer: Medicare HMO | Source: Ambulatory Visit | Attending: Internal Medicine | Admitting: Internal Medicine

## 2020-12-15 ENCOUNTER — Other Ambulatory Visit: Payer: Self-pay

## 2020-12-15 DIAGNOSIS — R053 Chronic cough: Secondary | ICD-10-CM

## 2020-12-15 DIAGNOSIS — R059 Cough, unspecified: Secondary | ICD-10-CM

## 2020-12-20 NOTE — Progress Notes (Signed)
LMTCB for the pt via pacific interpreter service.

## 2020-12-20 NOTE — Progress Notes (Addendum)
$'@Patient'X$  ID: Shawna Lowe, female    DOB: 1951-03-14, 70 y.o.   MRN: [id number]  Chief Complaint  Patient presents with  . Follow-up    No improvement in cough symptoms    Referring provider: Corliss Parish, MD  Brief patient profile:  70 yo [place] female  Never smoker with environmental smoke  exp moved to [place]  Initially [place] in 1986 and denies resp problems at time she moved but apparently developed rhinitis seasonally ? What Year then around 2018  started with more cough/ wheeze/ sob while still in [place] prior to moving to [place] in present home around fall 2020 and eval by Pulmonary at [facility] with dx of AB/ bronchiectasis with no relief other than temporarily p pred rx so referred to pulmonary clinic 11/30/2020 by Dr  Corliss Parish.  HPI: 70 year old female never smoked.  Past medical history significant for mild intermittent asthma, chronic cough, CAD s/p CABG in 2017, hx CVA in 2016.  Patient of Dr. Melvyn Novas was seen for initial consult on 11/30/2020 for chronic cough.  12/21/2020 - Interim hx  Patient presents today for a 2 to 4-week follow-up. Accompanied by her daughter. Her cough is no better or worse after starting nebulized budesonide and completing oral Prednisone course. She has some sinus pressure and is getting up yellow mucus form her sinuses. She had a CT of her sinuses on 12/16/2020 that showed extensive pansinusitis, mucosal thickening, nonspecific 70m hyperdense subcutaneous cyst or lesion.    Allergies  Allergen Reactions  . Mosquito (Culex Pipiens) Allergy Skin Test Rash and Shortness Of Breath    And  Trouble  breathing And  Trouble  breathing   . Lisinopril Hives and Rash    Patient cannot remember the exact nature of the allergy  . Penicillins Cough, Rash and Swelling    Swelling,  numbness numbness Swelling, numbness   . Shellfish-Derived Products Hives and Rash    New  allergy New allergy New allergy     Immunization History  Administered Date(s)  Administered  . Influenza, High Dose Seasonal PF 06/15/2017  . Influenza, Quadrivalent, Recombinant, Inj, Pf 06/25/2019  . Influenza,inj,Quad PF,6+ Mos 05/25/2016  . PFIZER(Purple Top)SARS-COV-2 Vaccination 12/18/2019, 01/08/2020  . Pneumococcal Conjugate-13 10/16/2017  . Pneumococcal Polysaccharide-23 03/12/2013, 06/25/2019  . Zoster 05/25/2016    Past Medical History:  Diagnosis Date  . Hypertension   . Pneumonia     Tobacco History: Social History   Tobacco Use  Smoking Status Never Smoker  Smokeless Tobacco Never Used   Counseling given: Not Answered   Outpatient Medications Prior to Visit  Medication Sig Dispense Refill  . aspirin 325 MG tablet Take 325 mg by mouth daily.    .Marland Kitchenatorvastatin (LIPITOR) 80 MG tablet Take 80 mg by mouth daily.    . fluticasone (FLONASE) 50 MCG/ACT nasal spray Place into both nostrils daily.    .Marland KitchenglipiZIDE (GLUCOTROL) 10 MG tablet Take 10 mg by mouth 2 (two) times daily before a meal.    . Insulin Glargine (BASAGLAR KWIKPEN) 100 UNIT/ML Inject 15 Units into the skin daily.    . insulin glargine (LANTUS) 100 UNIT/ML injection Inject 30 Units into the skin daily.    .Marland Kitchenlosartan (COZAAR) 100 MG tablet Take 100 mg by mouth daily.    . montelukast (SINGULAIR) 10 MG tablet Take 10 mg by mouth at bedtime.    . pantoprazole (PROTONIX) 40 MG tablet Take 40 mg by mouth daily.    . sevelamer carbonate (RENVELA) 800  MG tablet Take 800 mg by mouth 3 (three) times daily with meals.    . budesonide (PULMICORT) 0.25 MG/2ML nebulizer solution One twice daily with Performist 120 mL 12  . albuterol (PROVENTIL) (2.5 MG/3ML) 0.083% nebulizer solution Take 2.5 mg by nebulization every 6 (six) hours as needed for wheezing or shortness of breath. (Patient not taking: Reported on 12/21/2020)    . benzonatate (TESSALON) 100 MG capsule Take 100 mg by mouth 2 (two) times daily as needed for cough. (Patient not taking: Reported on 12/21/2020)    . metoprolol succinate  (TOPROL-XL) 50 MG 24 hr tablet Take 50 mg by mouth daily. Take with or immediately following a meal. (Patient not taking: Reported on 12/21/2020)    . Olopatadine HCl 0.2 % SOLN Apply to eye daily. (Patient not taking: Reported on 12/21/2020)    . predniSONE (DELTASONE) 10 MG tablet Take  4 each am x 2 days,   2 each am x 2 days,  1 each am x 2 days and stop 14 tablet 0   No facility-administered medications prior to visit.    Review of Systems  Review of Systems  HENT: Positive for congestion and sinus pressure.   Respiratory: Positive for cough. Negative for chest tightness, shortness of breath and wheezing.    Physical Exam  BP (!) 144/72 (BP Location: Left Arm, Cuff Size: Normal)   Pulse 88   Temp 98.2 F (36.8 C) (Temporal)   Ht '5\' 1"'$  (1.549 m)   Wt 164 lb 3.2 oz (74.5 kg)   SpO2 98% Comment: RA  BMI 31.03 kg/m  Physical Exam Constitutional:      Appearance: Normal appearance.     Comments: Non-English speaking  HENT:     Head: Normocephalic and atraumatic.     Nose: Rhinorrhea present.     Right Turbinates: Swollen.     Left Turbinates: Swollen.     Comments: ? Cyst left nostril     Mouth/Throat:     Mouth: Mucous membranes are moist.     Pharynx: Oropharynx is clear.  Cardiovascular:     Rate and Rhythm: Normal rate and regular rhythm.     Heart sounds: Murmur heard.    Pulmonary:     Effort: Pulmonary effort is normal.     Breath sounds: Normal breath sounds.     Comments: CTA Musculoskeletal:        General: Normal range of motion.  Skin:    General: Skin is warm and dry.  Neurological:     General: No focal deficit present.     Mental Status: She is alert and oriented to person, place, and time. Mental status is at baseline.  Psychiatric:        Mood and Affect: Mood normal.        Behavior: Behavior normal.        Thought Content: Thought content normal.        Judgment: Judgment normal.      Lab Results:  CBC No results found for: WBC, RBC,  HGB, HCT, PLT, MCV, MCH, MCHC, RDW, LYMPHSABS, MONOABS, EOSABS, BASOSABS  BMET No results found for: NA, K, CL, CO2, GLUCOSE, BUN, CREATININE, CALCIUM, GFRNONAA, GFRAA  BNP No results found for: BNP  ProBNP No results found for: PROBNP  Imaging: CT MAXILLOFACIAL WO CONTRAST  Result Date: 12/16/2020 CLINICAL DATA:  Chronic cough. Cough. Cough, persistent. Additional history provided by scanning technologist: Patient reports chronic cough, symptoms for 3 years. EXAM: CT MAXILLOFACIAL WITHOUT  CONTRAST TECHNIQUE: Multidetector CT images of the paranasal sinuses were obtained using the standard protocol without intravenous contrast. COMPARISON:  No pertinent prior exams available for comparison. FINDINGS: Paranasal sinuses: Frontal: Trace mucosal thickening within the frontal sinuses bilaterally. Patent frontal sinus drainage pathways. Ethmoid: Frothy secretions and moderate to moderately severe mucosal thickening within the bilateral ethmoid air cells. Maxillary: Frothy secretions and moderate/severe mucosal thickening within the left maxillary sinus. Frothy secretions and mild/moderate mucosal thickening within the right maxillary sinus. Sphenoid: Severe mucosal thickening and possible fluid within the right sphenoid sinus. Mucosal thickening obstructs the right maxillary sinus ostium and portions of the right sphenoethmoidal recess. Mild mucosal thickening and small volume secretions within the left sphenoid sinus. The left sphenoethmoidal recess is patent. Right ostiomeatal unit: Mucosal thickening slightly narrows the maxillary sinus ostium. Otherwise patent. Left ostiomeatal unit: Mucosal thickening slightly narrows the maxillary sinus ostium. Otherwise patent. Nasal passages: The nasal septum is essentially midline. Trace mucosal thickening within the bilateral nasal passages. Additionally, there is a lobular hyperdense subcutaneous cyst or nonspecific lesion along the inferolateral aspect of the  left nasal aperture (for instance as seen on series 3, image 13) (series 5, image 19). No appreciable adjacent bony remodeling. Anatomy: Pneumatization is present superior to the anterior ethmoid notches. Symmetric and intact olfactory grooves and fovea ethmoidalis, Keros II (4-78m). Sellar sphenoid pneumatization pattern. IMPRESSION: Extensive pansinusitis, as detailed within the body of the report and most notably affecting the bilateral ethmoid, sphenoid and maxillary sinuses. Mucosal thickening partially obstructs the right sphenoethmoidal recess and slightly narrows the maxillary sinus ostia bilaterally. Nonspecific 8 mm lobular hyperdense subcutaneous cyst or lesion along the inferolateral aspect of the left nasal aperture. Direct visualization is recommended. Trace mucosal thickening within the bilateral nasal passages. Electronically Signed   By: KKellie SimmeringDO   On: 12/16/2020 17:45     Assessment & Plan:   Chronic sinusitis - CT maxillofacial wo contrast showed extensive pansinusitis, mucosal thickening, nonspecific 841mhyperdense subcutaneous cyst or lesion.  -RX for Doxycycline '100mg'$  BID x 7 days for acute symptoms;  Adding Astelin nasal spray 1 per nostril twice daily  - Continue Flonase nasal spray once daily  - [facility]   Chronic cough - Most likely due to chronic sinusitis - No improvement after starting nebulized budesonide or completing oral prednisone course - Discontinue Pulmicort; continue Singulair '10mg'$  at bedtime and prn albuterol nebulizer q 6 hours for shortness of breath/wheezing or cough.    ElMartyn EhrichNP 12/21/2020

## 2020-12-21 ENCOUNTER — Encounter: Payer: Self-pay | Admitting: Primary Care

## 2020-12-21 ENCOUNTER — Other Ambulatory Visit: Payer: Self-pay

## 2020-12-21 ENCOUNTER — Ambulatory Visit: Payer: Medicare HMO | Admitting: Primary Care

## 2020-12-21 VITALS — BP 144/72 | HR 88 | Temp 98.2°F | Ht 61.0 in | Wt 164.2 lb

## 2020-12-21 DIAGNOSIS — R053 Chronic cough: Secondary | ICD-10-CM

## 2020-12-21 DIAGNOSIS — J329 Chronic sinusitis, unspecified: Secondary | ICD-10-CM | POA: Diagnosis not present

## 2020-12-21 MED ORDER — DOXYCYCLINE HYCLATE 100 MG PO TABS: 100.0000 mg | ORAL_TABLET | Freq: Two times a day (BID) | ORAL | 0 refills | Status: DC

## 2020-12-21 MED ORDER — AZELASTINE HCL 0.1 % NA SOLN: 1.0000 | Freq: Two times a day (BID) | NASAL | 3 refills | Status: DC

## 2020-12-21 NOTE — Assessment & Plan Note (Addendum)
-   CT maxillofacial wo contrast showed extensive pansinusitis, mucosal thickening, nonspecific 79m hyperdense subcutaneous cyst or lesion.  -RX for Doxycycline '100mg'$  BID x 7 days for acute symptoms;  Adding Astelin nasal spray 1 per nostril twice daily  - Continue Flonase nasal spray once daily  - [facility]

## 2020-12-21 NOTE — Patient Instructions (Signed)
Nice seeing you today Shawna Lowe  CT sinuses showed extensive sinusitis and mucosal thickening  Orders: Refer to [facility] [id number] sinusitis   Rx: Doxycycline 1 tab twice daily x 1 week (take with food and wear sunscreen while outdoors) Asteling nasal spray 1 puff per nostril twice daily  Recommendations: Continue Flonase nasal spray daily You can stop Pulmicort nebulizer   Follow-up 3 months with Shawna Lowe

## 2020-12-21 NOTE — Addendum Note (Signed)
Addended by: Martyn Ehrich on: 12/21/2020 09:40 AM   Modules accepted: Level of Service

## 2020-12-21 NOTE — Assessment & Plan Note (Addendum)
-   Most likely due to chronic sinusitis - No improvement after starting nebulized budesonide or completing oral prednisone course - Discontinue Pulmicort; continue Singulair '10mg'$  at bedtime and prn albuterol nebulizer q 6 hours for shortness of breath/wheezing or cough.

## 2020-12-23 ENCOUNTER — Ambulatory Visit: Payer: Medicare HMO | Admitting: Physical Therapy

## 2020-12-23 ENCOUNTER — Encounter: Payer: Self-pay | Admitting: Physical Therapy

## 2020-12-23 ENCOUNTER — Other Ambulatory Visit: Payer: Self-pay

## 2020-12-23 DIAGNOSIS — R296 Repeated falls: Secondary | ICD-10-CM

## 2020-12-23 DIAGNOSIS — R2689 Other abnormalities of gait and mobility: Secondary | ICD-10-CM

## 2020-12-23 DIAGNOSIS — R262 Difficulty in walking, not elsewhere classified: Secondary | ICD-10-CM

## 2020-12-23 DIAGNOSIS — M6281 Muscle weakness (generalized): Secondary | ICD-10-CM

## 2020-12-23 NOTE — Therapy (Signed)
[place]. [place], [place], [address] Phone: [phone]   Fax:  [phone]  Physical Therapy Treatment  Patient Details  Name: Shawna Lowe MRN: [id number] Date of Birth: 26-Feb-1951 Referring Provider (PT): Moshe Cipro   Encounter Date: 12/23/2020   PT End of Session - 12/23/20 1009    Visit Number 9    Date for PT Re-Evaluation 01/04/21    PT Start Time 0927    PT Stop Time 1010    PT Time Calculation (min) 43 min    Activity Tolerance Patient tolerated treatment well    Behavior During Therapy [facility] for tasks assessed/performed           Past Medical History:  Diagnosis Date  . Hypertension   . Pneumonia     History reviewed. No pertinent surgical history.  There were no vitals filed for this visit.   Subjective Assessment - 12/23/20 0948    Subjective No new complaints or falls reported    Currently in Pain? No/denies              [facility] PT Assessment - 12/23/20 0001      Transfers   Five time sit to stand comments  12.06 sec      Berg Balance Test   Sit to Stand Able to stand without using hands and stabilize independently    Standing Unsupported Able to stand safely 2 minutes    Sitting with Back Unsupported but Feet Supported on Floor or Stool Able to sit safely and securely 2 minutes    Stand to Sit Sits safely with minimal use of hands    Transfers Able to transfer safely, minor use of hands    Standing Unsupported with Eyes Closed Able to stand 10 seconds safely    Standing Unsupported with Feet Together Able to place feet together independently and stand 1 minute safely    From Standing, Reach Forward with Outstretched Arm Can reach forward >12 cm safely (5")    From Standing Position, Pick up Object from Floor Able to pick up shoe safely and easily    From Standing Position, Turn to Look Behind Over each Shoulder Looks behind one side only/other side shows less weight shift     Turn 360 Degrees Able to turn 360 degrees safely one side only in 4 seconds or less    Standing Unsupported, Alternately Place Feet on Step/Stool Able to stand independently and complete 8 steps >20 seconds    Standing Unsupported, One Foot in Front Able to take small step independently and hold 30 seconds    Standing on One Leg Tries to lift leg/unable to hold 3 seconds but remains standing independently    Total Score 47      Timed Up and Go Test   Normal TUG (seconds) 12.14    TUG Comments no AD, some weaving still but steadier and no LOB                         [facility] Adult PT Treatment/Exercise - 12/23/20 0001      High Level Balance   High Level Balance Activities Side stepping;Backward walking    High Level Balance Comments close supervision, no LOB this rx      Knee/Hip Exercises: Aerobic   Nustep L5 LE/UE x 44mn    Other Aerobic UBE L2 x3 min each way      Knee/Hip Exercises: Standing  Heel Raises Both;10 reps;1 set    Heel Raises Limitations 3#    Knee Flexion Both;1 set;10 reps    Knee Flexion Limitations 3#    Hip Flexion Both;1 set;10 reps    Hip Flexion Limitations 3#    Hip Abduction Both;1 set;10 reps    Abduction Limitations 3#    Hip Extension Both;1 set;10 reps    Extension Limitations 3#    Other Standing Knee Exercises alt step taps no HR 6" stair x10 B CGA                    PT Short Term Goals - 12/02/20 0910      PT SHORT TERM GOAL #1   Title Pt will be I with initial HEP    Baseline reports intermittently performing HEP    Time 2    Period Weeks    Status On-going             PT Long Term Goals - 12/23/20 0945      PT LONG TERM GOAL #1   Title Pt will demo TUG <15 sec with LRAD and gait WFL    Baseline 12.14 sec with no AD; some weaving no LOB    Time 8    Period Weeks    Status Achieved      PT LONG TERM GOAL #2   Title Pt will demo gait x100 ft around clinic with LRAD and gait WFL    Baseline gait x200  ft with no AD, no LOB but weaving with gait    Time 8    Period Weeks    Status Partially Met      PT LONG TERM GOAL #3   Title Pt will report no additional falls    Baseline reports no additional falls    Time 8    Period Weeks    Status Achieved      PT LONG TERM GOAL #4   Title Pt will demo Berg Balance score improved to 52/56    Baseline 47/56    Time 8    Period Weeks    Status Partially Met                 Plan - 12/23/20 1010    Clinical Impression Statement Pt is making progress toward LTG. Demos significant improvement on STS, TUG, and Berg Balance testing. Able to tolerate increased number of standing ex's before requiring break. Close supervision with balance ex's but no LOB this rx. Progress note next session and follow up regarding transplant info with daughter.    PT Treatment/Interventions ADLs/Self Care Home Management;Neuromuscular re-education;Balance training;Therapeutic exercise;Therapeutic activities;Functional mobility training;Gait training;Stair training;Patient/family education    PT Next Visit Plan balance ex's, endurance, gait training. Follow up regarding any new information regarding transplant    Consulted and Agree with Plan of Care Patient           Patient will benefit from skilled therapeutic intervention in order to improve the following deficits and impairments:  Abnormal gait,Decreased range of motion,Difficulty walking,Decreased endurance,Decreased safety awareness,Decreased activity tolerance,Decreased balance,Decreased mobility,Decreased strength,Postural dysfunction  Visit Diagnosis: Repeated falls  Other abnormalities of gait and mobility  Muscle weakness (generalized)  Difficulty in walking, not elsewhere classified     Problem List Patient Active Problem List   Diagnosis Date Noted  . Chronic sinusitis 12/21/2020  . Asthmatic bronchitis , chronic ([place]) 12/01/2020  . Chronic cough 11/30/2020   Amador Cunas, PT,  DPT  Donald Prose Mikolaj Woolstenhulme 12/23/2020, 10:12 AM  [place]. [place], [place], [address] Phone: [phone]   Fax:  [phone]  Name: Elita Dame MRN: [id number] Date of Birth: Nov 01, 1950

## 2020-12-28 ENCOUNTER — Ambulatory Visit: Payer: Medicare HMO | Admitting: Physical Therapy

## 2020-12-30 ENCOUNTER — Encounter: Payer: Self-pay | Admitting: *Deleted

## 2020-12-30 ENCOUNTER — Ambulatory Visit: Payer: Medicare HMO | Admitting: Physical Therapy

## 2020-12-30 ENCOUNTER — Other Ambulatory Visit: Payer: Self-pay

## 2020-12-30 ENCOUNTER — Encounter: Payer: Self-pay | Admitting: Physical Therapy

## 2020-12-30 DIAGNOSIS — R2689 Other abnormalities of gait and mobility: Secondary | ICD-10-CM

## 2020-12-30 DIAGNOSIS — R262 Difficulty in walking, not elsewhere classified: Secondary | ICD-10-CM

## 2020-12-30 DIAGNOSIS — R296 Repeated falls: Secondary | ICD-10-CM | POA: Diagnosis not present

## 2020-12-30 DIAGNOSIS — M6281 Muscle weakness (generalized): Secondary | ICD-10-CM

## 2020-12-30 NOTE — Therapy (Signed)
[place]. [place], [place], [address] Phone: [phone]   Fax:  [phone]  Physical Therapy Treatment Progress Note Reporting Period 11/04/2020 to 12/30/2020  See note below for Objective Data and Assessment of Progress/Goals.      Patient Details  Name: Shawna Lowe MRN: [id number] Date of Birth: Oct 04, 1950 Referring Provider (PT): Moshe Cipro   Encounter Date: 12/30/2020   PT End of Session - 12/30/20 1009    Visit Number 10    Date for PT Re-Evaluation 02/04/21    PT Start Time 0928    PT Stop Time 1010    PT Time Calculation (min) 42 min    Activity Tolerance Patient tolerated treatment well    Behavior During Therapy [facility] for tasks assessed/performed           Past Medical History:  Diagnosis Date  . Hypertension   . Pneumonia     History reviewed. No pertinent surgical history.  There were no vitals filed for this visit.   Subjective Assessment - 12/30/20 0935    Subjective Pt daughter brings copy of fit test requirements for transplant this rx. No new changes.    Currently in Pain? No/denies              [facility] PT Assessment - 12/30/20 0001      Transfers   Five time sit to stand comments  14 sec    Comments 30 sec chair stand: x11 STS from standard chair      High Level Balance   High Level Balance Comments 10 sec rhomberg and semi tandem; <8 sec tandem stance (all firm surface)                         [facility] Adult PT Treatment/Exercise - 12/30/20 0001      Knee/Hip Exercises: Aerobic   Nustep L5 LE/UE x 45mn    Other Aerobic UBE L2 x3 min each way      Knee/Hip Exercises: Machines for Strengthening   Cybex Knee Extension 5# 2x10 BLE    Cybex Knee Flexion 20# 2x10 BLE      Knee/Hip Exercises: Standing   Heel Raises Both;10 reps;1 set    Heel Raises Limitations 3#    Knee Flexion Both;1 set;10 reps    Knee Flexion Limitations 3#    Hip Flexion Both;1  set;10 reps    Hip Flexion Limitations 3#    Hip Abduction Both;1 set;10 reps    Abduction Limitations 3#    Hip Extension Both;1 set;10 reps    Extension Limitations 3#    Other Standing Knee Exercises alt step taps no HR 6" stair x10 B CGA      Knee/Hip Exercises: Seated   Sit to Sand 2 sets;10 reps;without UE support   yellow ball chest press and OHP                   PT Short Term Goals - 12/02/20 0910      PT SHORT TERM GOAL #1   Title Pt will be I with initial HEP    Baseline reports intermittently performing HEP    Time 2    Period Weeks    Status On-going             PT Long Term Goals - 12/23/20 0945      PT LONG TERM GOAL #1   Title Pt will demo TUG <15 sec  with LRAD and gait WFL    Baseline 12.14 sec with no AD; some weaving no LOB    Time 8    Period Weeks    Status Achieved      PT LONG TERM GOAL #2   Title Pt will demo gait x100 ft around clinic with LRAD and gait WFL    Baseline gait x200 ft with no AD, no LOB but weaving with gait    Time 8    Period Weeks    Status Partially Met      PT LONG TERM GOAL #3   Title Pt will report no additional falls    Baseline reports no additional falls    Time 8    Period Weeks    Status Achieved      PT LONG TERM GOAL #4   Title Pt will demo Berg Balance score improved to 52/56    Baseline 47/56    Time 8    Period Weeks    Status Partially Met                 Plan - 12/30/20 1010    Clinical Impression Statement Pt daughter brought in the fit test requirements for kidney transplant from [facility] today (see folder in gym for handout); pt demos 14 sec for 5xSTS this rx. Able to hold semi-tandem and rhomberg for >10 sec; <7 sec for tandem. Instructed to use putty or grip ball for increasing grip strength. Pt daughter plans to make appt for transplant fit test; follow up on this. We will continue with PT at 2x/week working on high level balance, gait, and functional strengthening until fit test.     PT Treatment/Interventions ADLs/Self Care Home Management;Neuromuscular re-education;Balance training;Therapeutic exercise;Therapeutic activities;Functional mobility training;Gait training;Stair training;Patient/family education    PT Next Visit Plan balance ex's, endurance, gait training. Fit test requirements for pt can be found in folder in gym    Consulted and Agree with Plan of Care Patient           Patient will benefit from skilled therapeutic intervention in order to improve the following deficits and impairments:  Abnormal gait,Decreased range of motion,Difficulty walking,Decreased endurance,Decreased safety awareness,Decreased activity tolerance,Decreased balance,Decreased mobility,Decreased strength,Postural dysfunction  Visit Diagnosis: Repeated falls  Other abnormalities of gait and mobility  Muscle weakness (generalized)  Difficulty in walking, not elsewhere classified     Problem List Patient Active Problem List   Diagnosis Date Noted  . Chronic sinusitis 12/21/2020  . Asthmatic bronchitis , chronic ([place]) 12/01/2020  . Chronic cough 11/30/2020   Amador Cunas, PT, DPT Donald Prose Giada Schoppe 12/30/2020, 10:13 AM  [place]. [place], [place], [address] Phone: [phone]   Fax:  [phone]  Name: Shawna Lowe MRN: [id number] Date of Birth: [birth year]

## 2020-12-30 NOTE — Progress Notes (Signed)
Called again via PI and there was no answer and no option to leave msg. Mailing letter and will also translate to [place].

## 2021-01-04 ENCOUNTER — Encounter: Payer: Self-pay | Admitting: Physical Therapy

## 2021-01-04 ENCOUNTER — Ambulatory Visit: Payer: Medicare HMO | Attending: Nephrology | Admitting: Physical Therapy

## 2021-01-04 ENCOUNTER — Other Ambulatory Visit: Payer: Self-pay

## 2021-01-04 DIAGNOSIS — R2689 Other abnormalities of gait and mobility: Secondary | ICD-10-CM | POA: Insufficient documentation

## 2021-01-04 DIAGNOSIS — R296 Repeated falls: Secondary | ICD-10-CM | POA: Insufficient documentation

## 2021-01-04 DIAGNOSIS — R262 Difficulty in walking, not elsewhere classified: Secondary | ICD-10-CM | POA: Diagnosis present

## 2021-01-04 DIAGNOSIS — M6281 Muscle weakness (generalized): Secondary | ICD-10-CM | POA: Insufficient documentation

## 2021-01-04 NOTE — Therapy (Signed)
[place]. [place], [place], [address] Phone: [phone]   Fax:  [phone]  Physical Therapy Treatment  Patient Details  Name: Shawna Lowe MRN: [id number] Date of Birth: November 03, 1950 Referring Provider (PT): Shawna Lowe   Encounter Date: 01/04/2021   PT End of Session - 01/04/21 1427    Visit Number 11    Date for PT Re-Evaluation 02/04/21    PT Start Time 1345    PT Stop Time 1426    PT Time Calculation (min) 41 min    Activity Tolerance Patient tolerated treatment well    Behavior During Therapy [facility] for tasks assessed/performed           Past Medical History:  Diagnosis Date  . Hypertension   . Pneumonia     History reviewed. No pertinent surgical history.  There were no vitals filed for this visit.   Subjective Assessment - 01/04/21 1348    Subjective Pt reports she will get fit test scheduled soon    Currently in Pain? No/denies                             [facility] Adult PT Treatment/Exercise - 01/04/21 0001      Knee/Hip Exercises: Aerobic   Nustep L5 LE/UE x 71mn    Other Aerobic UBE L2 x3 min each way      Knee/Hip Exercises: Machines for Strengthening   Cybex Knee Extension 5# 2x10 BLE    Cybex Knee Flexion 20# 2x10 BLE      Knee/Hip Exercises: Standing   Heel Raises Both;10 reps;1 set    Heel Raises Limitations 3#    Knee Flexion Both;1 set;10 reps    Knee Flexion Limitations 3#    Hip Flexion Both;1 set;10 reps    Hip Flexion Limitations 3#    Hip Abduction Both;1 set;10 reps    Abduction Limitations 3#    Hip Extension Both;1 set;10 reps    Extension Limitations 3#    Forward Step Up Both;2 sets;5 reps;Step Height: 4";Hand Hold: 1    Forward Step Up Limitations some catching of LLE on step    Other Standing Knee Exercises alt step taps no HR 6" stair x10 B CGA      Knee/Hip Exercises: Seated   Marching 2 sets;10 reps    Marching Weights 3 lbs.    Sit  to Sand 2 sets;10 reps;without UE support   yellow ball chest press and OHP                   PT Short Term Goals - 12/02/20 0910      PT SHORT TERM GOAL #1   Title Pt will be I with initial HEP    Baseline reports intermittently performing HEP    Time 2    Period Weeks    Status On-going             PT Long Term Goals - 12/23/20 0945      PT LONG TERM GOAL #1   Title Pt will demo TUG <15 sec with LRAD and gait WFL    Baseline 12.14 sec with no AD; some weaving no LOB    Time 8    Period Weeks    Status Achieved      PT LONG TERM GOAL #2   Title Pt will demo gait x100 ft [facility]  Baseline gait x200 ft with no AD, no LOB but weaving with gait    Time 8    Period Weeks    Status Partially Met      PT LONG TERM GOAL #3   Title Pt will report no additional falls    Baseline reports no additional falls    Time 8    Period Weeks    Status Achieved      PT LONG TERM GOAL #4   Title Pt will demo Berg Balance score improved to 52/56    Baseline 47/56    Time 8    Period Weeks    Status Partially Met                 Plan - 01/04/21 1427    Clinical Impression Statement Pt tolerated progression of exercise well. Fewer rest breaks required. Limited time in between activities and able to tolerate increasing number of standing ex's before requiring a seated rest break. Pt daughter will call regarding transplant fit test this week, follow up next rx. Continue to progress to tolerance.    PT Treatment/Interventions ADLs/Self Care Home Management;Neuromuscular re-education;Balance training;Therapeutic exercise;Therapeutic activities;Functional mobility training;Gait training;Stair training;Patient/family education    PT Next Visit Plan balance ex's, endurance, gait training. Fit test requirements for pt can be found in folder in gym    Consulted and Agree with Plan of Care Patient           Patient will benefit from skilled  therapeutic intervention in order to improve the following deficits and impairments:  Abnormal gait,Decreased range of motion,Difficulty walking,Decreased endurance,Decreased safety awareness,Decreased activity tolerance,Decreased balance,Decreased mobility,Decreased strength,Postural dysfunction  Visit Diagnosis: Repeated falls  Other abnormalities of gait and mobility  Muscle weakness (generalized)  Difficulty in walking, not elsewhere classified     Problem List Patient Active Problem List   Diagnosis Date Noted  . Chronic sinusitis 12/21/2020  . Asthmatic bronchitis , chronic ([place]awna Lowe) 12/01/2020  . Chronic cough 11/30/2020   Shawna Lowe, PT, DPT Shawna Lowe Shawna Lowe 01/04/2021, 2:28 PM  [place] [place]. [place], [place], [address] Phone: [phone]   Fax:  [phone]  Name: Shawna Lowe MRN: [id number] Date of Birth: [birth year]

## 2021-01-06 ENCOUNTER — Ambulatory Visit: Payer: Medicare HMO | Admitting: Physical Therapy

## 2021-01-06 ENCOUNTER — Other Ambulatory Visit: Payer: Self-pay

## 2021-01-06 ENCOUNTER — Encounter: Payer: Self-pay | Admitting: Physical Therapy

## 2021-01-06 DIAGNOSIS — R296 Repeated falls: Secondary | ICD-10-CM | POA: Diagnosis not present

## 2021-01-06 DIAGNOSIS — R262 Difficulty in walking, not elsewhere classified: Secondary | ICD-10-CM

## 2021-01-06 DIAGNOSIS — M6281 Muscle weakness (generalized): Secondary | ICD-10-CM

## 2021-01-06 DIAGNOSIS — R2689 Other abnormalities of gait and mobility: Secondary | ICD-10-CM

## 2021-01-06 NOTE — Therapy (Signed)
[place]. [place], [place], [address] Phone: [phone]   Fax:  [phone]  Physical Therapy Treatment  Patient Details  Name: Shawna Lowe MRN: [id number] Date of Birth: 1951-01-12 Referring Provider (PT): Moshe Cipro   Encounter Date: 01/06/2021   PT End of Session - 01/06/21 1603    Visit Number 12    Date for PT Re-Evaluation 02/04/21    PT Start Time 1517    PT Stop Time 1601    PT Time Calculation (min) 44 min    Activity Tolerance Patient tolerated treatment well    Behavior During Therapy [facility] for tasks assessed/performed           Past Medical History:  Diagnosis Date  . Hypertension   . Pneumonia     History reviewed. No pertinent surgical history.  There were no vitals filed for this visit.   Subjective Assessment - 01/06/21 1520    Subjective No new changes since last rx    Currently in Pain? No/denies                             [facility] Adult PT Treatment/Exercise - 01/06/21 0001      Knee/Hip Exercises: Aerobic   Nustep L5 LE/UE x 56mn    Other Aerobic L 2.5 x3 min each way      Knee/Hip Exercises: Machines for Strengthening   Cybex Knee Extension 5# 2x10 BLE    Cybex Knee Flexion 20# 2x10 BLE      Knee/Hip Exercises: Standing   Heel Raises Both;10 reps;1 set    Heel Raises Limitations 3#    Knee Flexion Both;1 set;10 reps    Knee Flexion Limitations 3#    Hip Flexion Both;1 set;10 reps    Hip Flexion Limitations 3#    Hip Abduction Both;1 set;10 reps    Abduction Limitations 3#    Hip Extension Both;1 set;10 reps    Extension Limitations 3#    Walking with Sports Cord resisted gait 20# x4 each direction    Other Standing Knee Exercises alt step taps no HR 6" stair 2x10 B CGA      Knee/Hip Exercises: Seated   Sit to Sand 2 sets;10 reps;without UE support   2# chest press and OHP                   PT Short Term Goals - 12/02/20 0910       PT SHORT TERM GOAL #1   Title Pt will be I with initial HEP    Baseline reports intermittently performing HEP    Time 2    Period Weeks    Status On-going             PT Long Term Goals - 12/23/20 0945      PT LONG TERM GOAL #1   Title Pt will demo TUG <15 sec with LRAD and gait WFL    Baseline 12.14 sec with no AD; some weaving no LOB    Time 8    Period Weeks    Status Achieved      PT LONG TERM GOAL #2   Title Pt will demo gait x100 ft around clinic with LRAD and gait WFL    Baseline gait x200 ft with no AD, no LOB but weaving with gait    Time 8    Period Weeks    Status Partially Met  PT LONG TERM GOAL #3   Title Pt will report no additional falls    Baseline reports no additional falls    Time 8    Period Weeks    Status Achieved      PT LONG TERM GOAL #4   Title Pt will demo Berg Balance score improved to 52/56    Baseline 47/56    Time 8    Period Weeks    Status Partially Met                 Plan - 01/06/21 1603    Clinical Impression Statement Completed most of today's session in standing with few rest breaks in between ex's. Pt does demo fatigue by end of session but able to tolerate all ex's. CGA for resisted gait d/t occasional LOB especially with sidestepping. Cues for larger step length. Ambulation around clinic with no AD, no LOB. Continue to progress to tolerance.    PT Treatment/Interventions ADLs/Self Care Home Management;Neuromuscular re-education;Balance training;Therapeutic exercise;Therapeutic activities;Functional mobility training;Gait training;Stair training;Patient/family education    PT Next Visit Plan balance ex's, endurance, gait training. Fit test requirements for pt can be found in folder in gym    Consulted and Agree with Plan of Care Patient           Patient will benefit from skilled therapeutic intervention in order to improve the following deficits and impairments:  Abnormal gait,Decreased range of  motion,Difficulty walking,Decreased endurance,Decreased safety awareness,Decreased activity tolerance,Decreased balance,Decreased mobility,Decreased strength,Postural dysfunction  Visit Diagnosis: Repeated falls  Other abnormalities of gait and mobility  Difficulty in walking, not elsewhere classified  Muscle weakness (generalized)     Problem List Patient Active Problem List   Diagnosis Date Noted  . Chronic sinusitis 12/21/2020  . Asthmatic bronchitis , chronic ([place]) 12/01/2020  . Chronic cough 11/30/2020   Amador Cunas, PT, DPT Donald Prose Brown [place] 01/06/2021, 4:06 PM  [place]. [place], [place], [address] Phone: [phone]   Fax:  [phone]  Name: Shawna Lowe MRN: [id number] Date of Birth: 1951/04/20

## 2021-01-07 ENCOUNTER — Telehealth: Payer: Self-pay | Admitting: Internal Medicine

## 2021-01-07 NOTE — Telephone Encounter (Signed)
LMTCB for the pt 

## 2021-01-10 ENCOUNTER — Telehealth: Payer: Self-pay | Admitting: Internal Medicine

## 2021-01-10 NOTE — Telephone Encounter (Signed)
Lmtcb for pt.  

## 2021-01-10 NOTE — Telephone Encounter (Signed)
Spoke with daughter (pt in background and could be heard) and reviewed CT results as dictated by Dr. Melvyn Novas. Pt's daughter states appt with ENT is already in place. Nothing further needed at this time.

## 2021-01-13 ENCOUNTER — Other Ambulatory Visit: Payer: Self-pay

## 2021-01-13 ENCOUNTER — Ambulatory Visit: Payer: Medicare HMO | Admitting: Physical Therapy

## 2021-01-13 ENCOUNTER — Encounter: Payer: Self-pay | Admitting: Physical Therapy

## 2021-01-13 DIAGNOSIS — R296 Repeated falls: Secondary | ICD-10-CM

## 2021-01-13 DIAGNOSIS — R262 Difficulty in walking, not elsewhere classified: Secondary | ICD-10-CM

## 2021-01-13 DIAGNOSIS — M6281 Muscle weakness (generalized): Secondary | ICD-10-CM

## 2021-01-13 DIAGNOSIS — R2689 Other abnormalities of gait and mobility: Secondary | ICD-10-CM

## 2021-01-13 NOTE — Therapy (Signed)
[place]. [place], [place], [address] Phone: [phone]   Fax:  [phone]  Physical Therapy Treatment  Patient Details  Name: Shawna Lowe MRN: [id number] Date of Birth: 20-Feb-1951 Referring Provider (PT): Moshe Cipro   Encounter Date: 01/13/2021   PT End of Session - 01/13/21 1606    Visit Number 13    Date for PT Re-Evaluation 02/04/21    PT Start Time 1528    PT Stop Time 1610    PT Time Calculation (min) 42 min    Activity Tolerance Patient tolerated treatment well    Behavior During Therapy [facility] for tasks assessed/performed           Past Medical History:  Diagnosis Date  . Hypertension   . Pneumonia     History reviewed. No pertinent surgical history.  There were no vitals filed for this visit.   Subjective Assessment - 01/13/21 1534    Subjective No new changes since last rx    Currently in Pain? No/denies                             [facility] Adult PT Treatment/Exercise - 01/13/21 0001      Knee/Hip Exercises: Aerobic   Nustep L5 LE/UE x 59mn    Other Aerobic L 2.5 x3 min each way      Knee/Hip Exercises: Machines for Strengthening   Cybex Knee Extension 5# 2x10 BLE    Cybex Knee Flexion 20# 2x10 BLE      Knee/Hip Exercises: Standing   Heel Raises Both;10 reps;1 set    Heel Raises Limitations 3#    Knee Flexion Both;1 set;10 reps    Knee Flexion Limitations 3#    Hip Flexion Both;1 set;10 reps    Hip Flexion Limitations 3#    Hip Abduction Both;1 set;10 reps    Abduction Limitations 3#    Hip Extension Both;1 set;10 reps    Extension Limitations 3#    Walking with Sports Cord resisted gait 20# x4 each direction    Other Standing Knee Exercises alt step taps no HR 6" stair 2x10 B CGA      Knee/Hip Exercises: Seated   Sit to Sand 2 sets;10 reps;without UE support   yellow ball chest press and OHP                   PT Short Term Goals - 12/02/20  0910      PT SHORT TERM GOAL #1   Title Pt will be I with initial HEP    Baseline reports intermittently performing HEP    Time 2    Period Weeks    Status On-going             PT Long Term Goals - 12/23/20 0945      PT LONG TERM GOAL #1   Title Pt will demo TUG <15 sec with LRAD and gait WFL    Baseline 12.14 sec with no AD; some weaving no LOB    Time 8    Period Weeks    Status Achieved      PT LONG TERM GOAL #2   Title Pt will demo gait x100 ft around clinic with LRAD and gait WFL    Baseline gait x200 ft with no AD, no LOB but weaving with gait    Time 8    Period Weeks    Status Partially  Met      PT LONG TERM GOAL #3   Title Pt will report no additional falls    Baseline reports no additional falls    Time 8    Period Weeks    Status Achieved      PT LONG TERM GOAL #4   Title Pt will demo Berg Balance score improved to 52/56    Baseline 47/56    Time 8    Period Weeks    Status Partially Met                 Plan - 01/13/21 1606    Clinical Impression Statement Pt with limited rest breaks between sessions; able to tolerate all ex's this rx without increase in pain or SOB. Did have a couple instances of catching foot with step taps requiring CGA, otherwise no LOB. Ambulated into clinic without SPC today. Does demo some swerving/instability esp when distracted, but overall much steadier walking around clinic. No LOB with gait this rx.    PT Treatment/Interventions ADLs/Self Care Home Management;Neuromuscular re-education;Balance training;Therapeutic exercise;Therapeutic activities;Functional mobility training;Gait training;Stair training;Patient/family education    PT Next Visit Plan balance ex's, endurance, gait training. Fit test requirements for pt can be found in folder in gym    Consulted and Agree with Plan of Care Patient           Patient will benefit from skilled therapeutic intervention in order to improve the following deficits and  impairments:  Abnormal gait,Decreased range of motion,Difficulty walking,Decreased endurance,Decreased safety awareness,Decreased activity tolerance,Decreased balance,Decreased mobility,Decreased strength,Postural dysfunction  Visit Diagnosis: Repeated falls  Other abnormalities of gait and mobility  Difficulty in walking, not elsewhere classified  Muscle weakness (generalized)     Problem List Patient Active Problem List   Diagnosis Date Noted  . Chronic sinusitis 12/21/2020  . Asthmatic bronchitis , chronic ([place]) 12/01/2020  . Chronic cough 11/30/2020   Shawna Lowe, PT, Shawna Lowe Shawna Lowe 01/13/2021, 4:09 PM  [place]. [place], [place], [address] Phone: [phone]   Fax:  [phone]  Name: Shawna Lowe MRN: [id number] Date of Birth: 02/03/1951

## 2021-01-14 NOTE — Telephone Encounter (Signed)
Patient's daughter was made aware of results on 01/10/21.

## 2021-01-18 ENCOUNTER — Encounter: Payer: Self-pay | Admitting: Physical Therapy

## 2021-01-18 ENCOUNTER — Other Ambulatory Visit: Payer: Self-pay

## 2021-01-18 ENCOUNTER — Ambulatory Visit: Payer: Medicare HMO | Admitting: Physical Therapy

## 2021-01-18 DIAGNOSIS — R296 Repeated falls: Secondary | ICD-10-CM

## 2021-01-18 DIAGNOSIS — R2689 Other abnormalities of gait and mobility: Secondary | ICD-10-CM

## 2021-01-18 DIAGNOSIS — M6281 Muscle weakness (generalized): Secondary | ICD-10-CM

## 2021-01-18 DIAGNOSIS — R262 Difficulty in walking, not elsewhere classified: Secondary | ICD-10-CM

## 2021-01-18 NOTE — Therapy (Signed)
[place]. [place], [place], [address] Phone: [phone]   Fax:  [phone]  Physical Therapy Treatment  Patient Details  Name: Shawna Lowe MRN: [id number] Date of Birth: [birth year] Referring Provider (PT): Moshe Cipro   Encounter Date: 01/18/2021   PT End of Session - 01/18/21 1615    Visit Number 14    Date for PT Re-Evaluation 02/04/21    PT Start Time 1529    PT Stop Time 5615    PT Time Calculation (min) 45 min    Activity Tolerance Patient tolerated treatment well    Behavior During Therapy [facility] for tasks assessed/performed           Past Medical History:  Diagnosis Date  . Hypertension   . Pneumonia     History reviewed. No pertinent surgical history.  There were no vitals filed for this visit.   Subjective Assessment - 01/18/21 1526    Subjective Pt reports feeling pretty sleepy today    Currently in Pain? No/denies                             [facility] Adult PT Treatment/Exercise - 01/18/21 0001      Knee/Hip Exercises: Aerobic   Nustep L5 LE/UE x 54mn    Other Aerobic L 2.5 x3 min each way      Knee/Hip Exercises: Machines for Strengthening   Cybex Knee Extension 5# 2x10 BLE    Cybex Knee Flexion 20# 2x10 BLE    Cybex Leg Press 20# 3x10 BLE    Other Machine rows and lats 20# 2x10      Knee/Hip Exercises: Standing   Heel Raises Both;10 reps;1 set    Heel Raises Limitations 3#    Knee Flexion Both;1 set;10 reps    Knee Flexion Limitations 3#    Hip Flexion Both;1 set;10 reps    Hip Flexion Limitations 3#    Hip Abduction Both;1 set;10 reps    Abduction Limitations 3#    Hip Extension Both;1 set;10 reps    Extension Limitations 3#      Knee/Hip Exercises: Seated   Sit to Sand 2 sets;10 reps;without UE support   yellow ball chest press                   PT Short Term Goals - 12/02/20 0910      PT SHORT TERM GOAL #1   Title Pt will be I with  initial HEP    Baseline reports intermittently performing HEP    Time 2    Period Weeks    Status On-going             PT Long Term Goals - 12/23/20 0945      PT LONG TERM GOAL #1   Title Pt will demo TUG <15 sec with LRAD and gait WFL    Baseline 12.14 sec with no AD; some weaving no LOB    Time 8    Period Weeks    Status Achieved      PT LONG TERM GOAL #2   Title Pt will demo gait x100 ft around clinic with LRAD and gait WFL    Baseline gait x200 ft with no AD, no LOB but weaving with gait    Time 8    Period Weeks    Status Partially Met      PT LONG TERM GOAL #3  Title Pt will report no additional falls    Baseline reports no additional falls    Time 8    Period Weeks    Status Achieved      PT LONG TERM GOAL #4   Title Pt will demo Berg Balance score improved to 52/56    Baseline 47/56    Time 8    Period Weeks    Status Partially Met                 Plan - 01/18/21 1616    Clinical Impression Statement Pt tolerated progression of TE well today with very limited rest breaks. Able to tolerate up to 10 minutes at a time standing ex's without needing seated rest break. Tactile and verbal cues for posture/form with seated rows and lat pulldowns to avoid postural sway. CGA getting on/off leg press and seated row machine. Continue to progress to tolerance.    PT Treatment/Interventions ADLs/Self Care Home Management;Neuromuscular re-education;Balance training;Therapeutic exercise;Therapeutic activities;Functional mobility training;Gait training;Stair training;Patient/family education    PT Next Visit Plan balance ex's, endurance, gait training. Fit test requirements for pt can be found in folder in gym    Consulted and Agree with Plan of Care Patient           Patient will benefit from skilled therapeutic intervention in order to improve the following deficits and impairments:  Abnormal gait,Decreased range of motion,Difficulty walking,Decreased  endurance,Decreased safety awareness,Decreased activity tolerance,Decreased balance,Decreased mobility,Decreased strength,Postural dysfunction  Visit Diagnosis: Repeated falls  Other abnormalities of gait and mobility  Difficulty in walking, not elsewhere classified  Muscle weakness (generalized)     Problem List Patient Active Problem List   Diagnosis Date Noted  . Chronic sinusitis 12/21/2020  . Asthmatic bronchitis , chronic ([place]) 12/01/2020  . Chronic cough 11/30/2020   Amador Cunas, PT, DPT Donald Prose Lonzell Dorris 01/18/2021, 4:17 PM  [place]. [place], [place], [address] Phone: [phone]   Fax:  [phone]  Name: Shawna Lowe MRN: [id number] Date of Birth: [birth year]

## 2021-01-20 ENCOUNTER — Ambulatory Visit: Payer: Medicare HMO | Admitting: Physical Therapy

## 2021-01-20 ENCOUNTER — Encounter: Payer: Self-pay | Admitting: Physical Therapy

## 2021-01-20 ENCOUNTER — Other Ambulatory Visit: Payer: Self-pay

## 2021-01-20 DIAGNOSIS — M6281 Muscle weakness (generalized): Secondary | ICD-10-CM

## 2021-01-20 DIAGNOSIS — R262 Difficulty in walking, not elsewhere classified: Secondary | ICD-10-CM

## 2021-01-20 DIAGNOSIS — R2689 Other abnormalities of gait and mobility: Secondary | ICD-10-CM

## 2021-01-20 DIAGNOSIS — R296 Repeated falls: Secondary | ICD-10-CM | POA: Diagnosis not present

## 2021-01-20 NOTE — Therapy (Signed)
[place]. [place], [place], [address] Phone: [phone]   Fax:  [phone]  Physical Therapy Treatment  Patient Details  Name: Shawna Lowe MRN: [id number] Date of Birth: 03-04-1951 Referring Provider (PT): Moshe Cipro   Encounter Date: 01/20/2021   PT End of Session - 01/20/21 1611    Visit Number 15    Date for PT Re-Evaluation 02/04/21    PT Start Time 3329    PT Stop Time 1611    PT Time Calculation (min) 40 min    Activity Tolerance Patient tolerated treatment well    Behavior During Therapy [facility] for tasks assessed/performed           Past Medical History:  Diagnosis Date  . Hypertension   . Pneumonia     History reviewed. No pertinent surgical history.  There were no vitals filed for this visit.   Subjective Assessment - 01/20/21 1538    Subjective Pt reports feeling good today    Currently in Pain? No/denies                             [facility] Adult PT Treatment/Exercise - 01/20/21 0001      Knee/Hip Exercises: Aerobic   Nustep L5 LE/UE x 93mn    Other Aerobic L 2.5 x3 min each way      Knee/Hip Exercises: Machines for Strengthening   Cybex Knee Extension 10# 2x10 BLE    Cybex Knee Flexion 20# 2x10 BLE    Cybex Leg Press 30# 2x10 BLE    Other Machine rows and lats 20# 2x10      Knee/Hip Exercises: Standing   Walking with Sports Cord resisted gait 20# x4 each direction      Knee/Hip Exercises: Seated   Sit to Sand 2 sets;10 reps;without UE support   3# chest press and OHP                   PT Short Term Goals - 12/02/20 0910      PT SHORT TERM GOAL #1   Title Pt will be I with initial HEP    Baseline reports intermittently performing HEP    Time 2    Period Weeks    Status On-going             PT Long Term Goals - 12/23/20 0945      PT LONG TERM GOAL #1   Title Pt will demo TUG <15 sec with LRAD and gait WFL    Baseline 12.14 sec with  no AD; some weaving no LOB    Time 8    Period Weeks    Status Achieved      PT LONG TERM GOAL #2   Title Pt will demo gait x100 ft around clinic with LRAD and gait WFL    Baseline gait x200 ft with no AD, no LOB but weaving with gait    Time 8    Period Weeks    Status Partially Met      PT LONG TERM GOAL #3   Title Pt will report no additional falls    Baseline reports no additional falls    Time 8    Period Weeks    Status Achieved      PT LONG TERM GOAL #4   Title Pt will demo Berg Balance score improved to 52/56    Baseline 47/56  Time 8    Period Weeks    Status Partially Met                 Plan - 01/20/21 1611    Clinical Impression Statement Progressed weights on machine interventions; patient tolerated well. Needed tactile and verbal cues for seated rows to avoid postural sway. Close supervision when getting on/off machines d/t occasional LOB. Discussed with daughter scheduling transplant appt soon so that there is not a gap between end of PT and that appt. Continue to progress to tolerance.    PT Treatment/Interventions ADLs/Self Care Home Management;Neuromuscular re-education;Balance training;Therapeutic exercise;Therapeutic activities;Functional mobility training;Gait training;Stair training;Patient/family education    PT Next Visit Plan balance ex's, endurance, gait training. Fit test requirements for pt can be found in folder in gym    Consulted and Agree with Plan of Care Patient           Patient will benefit from skilled therapeutic intervention in order to improve the following deficits and impairments:  Abnormal gait,Decreased range of motion,Difficulty walking,Decreased endurance,Decreased safety awareness,Decreased activity tolerance,Decreased balance,Decreased mobility,Decreased strength,Postural dysfunction  Visit Diagnosis: Repeated falls  Other abnormalities of gait and mobility  Difficulty in walking, not elsewhere classified  Muscle  weakness (generalized)     Problem List Patient Active Problem List   Diagnosis Date Noted  . Chronic sinusitis 12/21/2020  . Asthmatic bronchitis , chronic ([place]) 12/01/2020  . Chronic cough 11/30/2020   Amador Cunas, PT, DPT Donald Prose Wynonna Fitzhenry 01/20/2021, 4:13 PM  [place]. [place], [place], [address] Phone: [phone]   Fax:  [phone]  Name: Shawna Lowe MRN: [id number] Date of Birth: [birth year]

## 2021-01-25 ENCOUNTER — Other Ambulatory Visit: Payer: Self-pay

## 2021-01-25 ENCOUNTER — Encounter: Payer: Self-pay | Admitting: Physical Therapy

## 2021-01-25 ENCOUNTER — Ambulatory Visit: Payer: Medicare HMO | Admitting: Physical Therapy

## 2021-01-25 DIAGNOSIS — R296 Repeated falls: Secondary | ICD-10-CM | POA: Diagnosis not present

## 2021-01-25 DIAGNOSIS — R2689 Other abnormalities of gait and mobility: Secondary | ICD-10-CM

## 2021-01-25 DIAGNOSIS — M6281 Muscle weakness (generalized): Secondary | ICD-10-CM

## 2021-01-25 DIAGNOSIS — R262 Difficulty in walking, not elsewhere classified: Secondary | ICD-10-CM

## 2021-01-25 DIAGNOSIS — J301 Allergic rhinitis due to pollen: Secondary | ICD-10-CM | POA: Insufficient documentation

## 2021-01-25 NOTE — Therapy (Signed)
[place]. [place], [place], [address] Phone: [phone]   Fax:  [phone]  Physical Therapy Treatment  Patient Details  Name: Shawna Lowe MRN: [id number] Date of Birth: 11-10-1950 Referring Provider (PT): Moshe Cipro   Encounter Date: 01/25/2021   PT End of Session - 01/25/21 1009    Visit Number 16    Date for PT Re-Evaluation 02/04/21    PT Start Time 0917    PT Stop Time 1002    PT Time Calculation (min) 45 min    Activity Tolerance Patient tolerated treatment well    Behavior During Therapy [facility] for tasks assessed/performed           Past Medical History:  Diagnosis Date  . Hypertension   . Pneumonia     History reviewed. No pertinent surgical history.  There were no vitals filed for this visit.   Subjective Assessment - 01/25/21 0930    Subjective Pt daughter reports pt is feeling a little "off" today. States no pain.    Currently in Pain? No/denies                             [facility] Adult PT Treatment/Exercise - 01/25/21 0001      Knee/Hip Exercises: Aerobic   Nustep L5 LE/UE x 73mn    Other Aerobic L 2.5 x3 min each way      Knee/Hip Exercises: Machines for Strengthening   Cybex Knee Extension 10# 2x10 BLE    Cybex Knee Flexion 20# 2x10 BLE    Cybex Leg Press 30# 2x10 BLE    Other Machine standing shoulder extension 5# 2x10      Knee/Hip Exercises: Standing   Heel Raises Both;10 reps;1 set    Heel Raises Limitations 3#    Knee Flexion Both;1 set;10 reps    Knee Flexion Limitations 3#    Hip Flexion Both;1 set;10 reps    Hip Flexion Limitations 3#    Hip Abduction Both;1 set;10 reps    Abduction Limitations 3#    Hip Extension Both;1 set;10 reps    Extension Limitations 3#      Knee/Hip Exercises: Seated   Sit to Sand 2 sets;10 reps;without UE support   with 3# chest press and OHP                   PT Short Term Goals - 12/02/20 0910       PT SHORT TERM GOAL #1   Title Pt will be I with initial HEP    Baseline reports intermittently performing HEP    Time 2    Period Weeks    Status On-going             PT Long Term Goals - 12/23/20 0945      PT LONG TERM GOAL #1   Title Pt will demo TUG <15 sec with LRAD and gait WFL    Baseline 12.14 sec with no AD; some weaving no LOB    Time 8    Period Weeks    Status Achieved      PT LONG TERM GOAL #2   Title Pt will demo gait x100 ft around clinic with LRAD and gait WFL    Baseline gait x200 ft with no AD, no LOB but weaving with gait    Time 8    Period Weeks    Status Partially Met  PT LONG TERM GOAL #3   Title Pt will report no additional falls    Baseline reports no additional falls    Time 8    Period Weeks    Status Achieved      PT LONG TERM GOAL #4   Title Pt will demo Berg Balance score improved to 52/56    Baseline 47/56    Time 8    Period Weeks    Status Partially Met                 Plan - 01/25/21 1009    Clinical Impression Statement Pt tolerated progression of TE well with no c/o of increased pain with exercise. Pt daughter reports that pt did not sleep well last night and is a little "off" this morning d/t fatigue. Did note some increased postural sway with gait today; close supervision around clinic. Continue to progress functional strengthening and high level balance ex's to tolerance.    PT Treatment/Interventions ADLs/Self Care Home Management;Neuromuscular re-education;Balance training;Therapeutic exercise;Therapeutic activities;Functional mobility training;Gait training;Stair training;Patient/family education    PT Next Visit Plan balance ex's, endurance, gait training. Fit test requirements for pt can be found in folder in gym    Consulted and Agree with Plan of Care Patient           Patient will benefit from skilled therapeutic intervention in order to improve the following deficits and impairments:  Abnormal  gait,Decreased range of motion,Difficulty walking,Decreased endurance,Decreased safety awareness,Decreased activity tolerance,Decreased balance,Decreased mobility,Decreased strength,Postural dysfunction  Visit Diagnosis: Repeated falls  Other abnormalities of gait and mobility  Difficulty in walking, not elsewhere classified  Muscle weakness (generalized)     Problem List Patient Active Problem List   Diagnosis Date Noted  . Chronic sinusitis 12/21/2020  . Asthmatic bronchitis , chronic ([place]) 12/01/2020  . Chronic cough 11/30/2020   Shawna Lowe, PT, DPT Shawna Lowe 01/25/2021, 10:12 AM  [place]. [place], [place], [address] Phone: [phone]   Fax:  [phone]  Name: Shawna Lowe MRN: [id number] Date of Birth: 10/28/1950

## 2021-01-27 ENCOUNTER — Ambulatory Visit: Payer: Medicare HMO | Admitting: Physical Therapy

## 2021-02-03 ENCOUNTER — Ambulatory Visit: Payer: Medicare HMO | Attending: Nephrology | Admitting: Physical Therapy

## 2021-02-03 ENCOUNTER — Encounter: Payer: Self-pay | Admitting: Physical Therapy

## 2021-02-03 ENCOUNTER — Other Ambulatory Visit: Payer: Self-pay

## 2021-02-03 DIAGNOSIS — R262 Difficulty in walking, not elsewhere classified: Secondary | ICD-10-CM | POA: Insufficient documentation

## 2021-02-03 DIAGNOSIS — M6281 Muscle weakness (generalized): Secondary | ICD-10-CM | POA: Insufficient documentation

## 2021-02-03 DIAGNOSIS — R2689 Other abnormalities of gait and mobility: Secondary | ICD-10-CM | POA: Diagnosis present

## 2021-02-03 DIAGNOSIS — R296 Repeated falls: Secondary | ICD-10-CM | POA: Insufficient documentation

## 2021-02-03 NOTE — Therapy (Signed)
[place]. [place], [place], [address] Phone: [phone]   Fax:  [phone]  Physical Therapy Treatment  Patient Details  Name: Shawna Lowe MRN: [id number] Date of Birth: 05-01-1951 Referring Provider (PT): Moshe Cipro   Encounter Date: 02/03/2021   PT End of Session - 02/03/21 1617    Visit Number 17    Date for PT Re-Evaluation 02/04/21    PT Start Time 1528    PT Stop Time 1611    PT Time Calculation (min) 43 min    Activity Tolerance Patient tolerated treatment well    Behavior During Therapy [facility] for tasks assessed/performed           Past Medical History:  Diagnosis Date  . Hypertension   . Pneumonia     History reviewed. No pertinent surgical history.  There were no vitals filed for this visit.   Subjective Assessment - 02/03/21 1533    Subjective Pt states feeling good today with no pain    Currently in Pain? No/denies                             [facility] Adult PT Treatment/Exercise - 02/03/21 0001      Knee/Hip Exercises: Aerobic   Nustep L5 LE/UE x 53mn    Other Aerobic L 2.5 x3 min each way      Knee/Hip Exercises: Machines for Strengthening   Cybex Knee Extension 10# 2x10 BLE    Cybex Knee Flexion 20# 2x10 BLE    Cybex Leg Press 30# 2x10 BLE    Other Machine rows and lats 20# 2x10; standing shoulder extension 5# 2x10      Knee/Hip Exercises: Standing   Heel Raises Both;1 set;15 reps      Knee/Hip Exercises: Seated   Sit to Sand 2 sets;10 reps;without UE support   with 3# chest press and OHP                   PT Short Term Goals - 12/02/20 0910      PT SHORT TERM GOAL #1   Title Pt will be I with initial HEP    Baseline reports intermittently performing HEP    Time 2    Period Weeks    Status On-going             PT Long Term Goals - 12/23/20 0945      PT LONG TERM GOAL #1   Title Pt will demo TUG <15 sec with LRAD and gait WFL     Baseline 12.14 sec with no AD; some weaving no LOB    Time 8    Period Weeks    Status Achieved      PT LONG TERM GOAL #2   Title Pt will demo gait x100 ft around clinic with LRAD and gait WFL    Baseline gait x200 ft with no AD, no LOB but weaving with gait    Time 8    Period Weeks    Status Partially Met      PT LONG TERM GOAL #3   Title Pt will report no additional falls    Baseline reports no additional falls    Time 8    Period Weeks    Status Achieved      PT LONG TERM GOAL #4   Title Pt will demo Berg Balance score improved to 52/56    Baseline  47/56    Time 8    Period Weeks    Status Partially Met                 Plan - 02/03/21 1617    Clinical Impression Statement Pt able to complete all ex's this rx with no increase of pain and no LOB. Improved stability with gait; occasional postural sway with transitions. Close supervision getting on/off machines. Continue to progress functional strengthening and high level balance ex's to tolerance.    PT Treatment/Interventions ADLs/Self Care Home Management;Neuromuscular re-education;Balance training;Therapeutic exercise;Therapeutic activities;Functional mobility training;Gait training;Stair training;Patient/family education    PT Next Visit Plan balance ex's, endurance, gait training. Fit test requirements for pt can be found in folder in gym    Consulted and Agree with Plan of Care Patient           Patient will benefit from skilled therapeutic intervention in order to improve the following deficits and impairments:  Abnormal gait,Decreased range of motion,Difficulty walking,Decreased endurance,Decreased safety awareness,Decreased activity tolerance,Decreased balance,Decreased mobility,Decreased strength,Postural dysfunction  Visit Diagnosis: Repeated falls  Other abnormalities of gait and mobility  Difficulty in walking, not elsewhere classified  Muscle weakness (generalized)     Problem List Patient  Active Problem List   Diagnosis Date Noted  . Chronic sinusitis 12/21/2020  . Asthmatic bronchitis , chronic ([place]) 12/01/2020  . Chronic cough 11/30/2020   Amador Cunas, PT, DPT Donald Prose Ammara Raj 02/03/2021, 4:19 PM  [place]. [place], [place], [address] Phone: [phone]   Fax:  [phone]  Name: Shawna Lowe MRN: [id number] Date of Birth: [birth year]

## 2021-02-15 ENCOUNTER — Other Ambulatory Visit: Payer: Self-pay

## 2021-02-15 ENCOUNTER — Encounter: Payer: Self-pay | Admitting: Physical Therapy

## 2021-02-15 ENCOUNTER — Ambulatory Visit: Payer: Medicare HMO | Admitting: Physical Therapy

## 2021-02-15 DIAGNOSIS — R296 Repeated falls: Secondary | ICD-10-CM | POA: Diagnosis not present

## 2021-02-15 DIAGNOSIS — M6281 Muscle weakness (generalized): Secondary | ICD-10-CM

## 2021-02-15 DIAGNOSIS — R2689 Other abnormalities of gait and mobility: Secondary | ICD-10-CM

## 2021-02-15 DIAGNOSIS — R262 Difficulty in walking, not elsewhere classified: Secondary | ICD-10-CM

## 2021-02-15 NOTE — Therapy (Signed)
[place]. [place], [place], [address] Phone: [phone]   Fax:  [phone]  Physical Therapy Treatment  Patient Details  Name: Shawna Lowe MRN: [id number] Date of Birth: Nov 18, 1950 Referring Provider (PT): Moshe Cipro   Encounter Date: 02/15/2021   PT End of Session - 02/15/21 1641     Visit Number 18    Date for PT Re-Evaluation 03/17/21    PT Start Time 1559    PT Stop Time 1641    PT Time Calculation (min) 42 min    Activity Tolerance Patient tolerated treatment well    Behavior During Therapy [facility] for tasks assessed/performed             Past Medical History:  Diagnosis Date   Hypertension    Pneumonia     History reviewed. No pertinent surgical history.  There were no vitals filed for this visit.   Subjective Assessment - 02/15/21 1559     Subjective Pt states feeling good today with no pain    Currently in Pain? No/denies                               [facility] Adult PT Treatment/Exercise - 02/15/21 0001       Knee/Hip Exercises: Aerobic   Nustep L5 LE/UE x 32mn    Other Aerobic L 2.5 x3 min each way      Knee/Hip Exercises: Machines for Strengthening   Cybex Knee Extension 10# 2x10 BLE    Cybex Knee Flexion 20# 2x10 BLE    Other Machine rows and lats 20# 2x10; standing shoulder extension 5# 2x10      Knee/Hip Exercises: Standing   Heel Raises Both;1 set;15 reps    Knee Flexion Both;1 set;10 reps    Knee Flexion Limitations 3#    Hip Flexion Both;1 set;10 reps    Hip Flexion Limitations 3#    Hip Abduction Both;1 set;10 reps    Abduction Limitations 3#    Hip Extension Both;1 set;10 reps    Extension Limitations 3#      Knee/Hip Exercises: Seated   Sit to Sand 2 sets;10 reps;without UE support   yellow ball chest press and OHP                     PT Short Term Goals - 12/02/20 0910       PT SHORT TERM GOAL #1   Title Pt will be I  with initial HEP    Baseline reports intermittently performing HEP    Time 2    Period Weeks    Status On-going               PT Long Term Goals - 12/23/20 0945       PT LONG TERM GOAL #1   Title Pt will demo TUG <15 sec with LRAD and gait WFL    Baseline 12.14 sec with no AD; some weaving no LOB    Time 8    Period Weeks    Status Achieved      PT LONG TERM GOAL #2   Title Pt will demo gait x100 ft around clinic with LRAD and gait WFL    Baseline gait x200 ft with no AD, no LOB but weaving with gait    Time 8    Period Weeks    Status Partially Met      PT  LONG TERM GOAL #3   Title Pt will report no additional falls    Baseline reports no additional falls    Time 8    Period Weeks    Status Achieved      PT LONG TERM GOAL #4   Title Pt will demo Berg Balance score improved to 52/56    Baseline 47/56    Time 8    Period Weeks    Status Partially Met                   Plan - 02/15/21 1641     Clinical Impression Statement Pt making progress toward LTGs with improved gait stability and LE strength. Still requires frequent cuing for posture/form and close supervision getting on/off machines. Discussed continuation of PT with daughter; will plan for this week and next with hopeful goal of discharging at the last scheduled rx. Daughter reports she is still waiting to hear about follow up appt for transplant. Continue to progress functional strengthening and high level balance ex's.    PT Treatment/Interventions ADLs/Self Care Home Management;Neuromuscular re-education;Balance training;Therapeutic exercise;Therapeutic activities;Functional mobility training;Gait training;Stair training;Patient/family education    PT Next Visit Plan balance ex's, endurance, gait training. Fit test requirements for pt can be found in folder in gym    Consulted and Agree with Plan of Care Patient             Patient will benefit from skilled therapeutic intervention in order  to improve the following deficits and impairments:  Abnormal gait, Decreased range of motion, Difficulty walking, Decreased endurance, Decreased safety awareness, Decreased activity tolerance, Decreased balance, Decreased mobility, Decreased strength, Postural dysfunction  Visit Diagnosis: Repeated falls  Other abnormalities of gait and mobility  Difficulty in walking, not elsewhere classified  Muscle weakness (generalized)     Problem List Patient Active Problem List   Diagnosis Date Noted   Chronic sinusitis 12/21/2020   Asthmatic bronchitis , chronic (HCC) 12/01/2020   Chronic cough 11/30/2020   Amador Cunas, PT, DPT Donald Prose Rayner Erman 02/15/2021, 4:43 PM  [place]. [place], [place], [address] Phone: [phone]   Fax:  [phone]  Name: Shawna Lowe MRN: [id number] Date of Birth: 1950-12-12

## 2021-02-17 ENCOUNTER — Other Ambulatory Visit: Payer: Self-pay

## 2021-02-17 ENCOUNTER — Encounter: Payer: Self-pay | Admitting: Physical Therapy

## 2021-02-17 ENCOUNTER — Ambulatory Visit: Payer: Medicare HMO | Admitting: Physical Therapy

## 2021-02-17 DIAGNOSIS — M6281 Muscle weakness (generalized): Secondary | ICD-10-CM

## 2021-02-17 DIAGNOSIS — R296 Repeated falls: Secondary | ICD-10-CM | POA: Diagnosis not present

## 2021-02-17 DIAGNOSIS — R262 Difficulty in walking, not elsewhere classified: Secondary | ICD-10-CM

## 2021-02-17 DIAGNOSIS — R2689 Other abnormalities of gait and mobility: Secondary | ICD-10-CM

## 2021-02-17 NOTE — Therapy (Signed)
[place]. [place], [place], [address] Phone: [phone]   Fax:  [phone]  Physical Therapy Treatment  Patient Details  Name: Shawna Lowe MRN: [id number] Date of Birth: [birth year] Referring Provider (PT): Moshe Cipro   Encounter Date: 02/17/2021   PT End of Session - 02/17/21 1428     Visit Number 19    Date for PT Re-Evaluation 03/17/21    PT Start Time 1344    PT Stop Time 1427    PT Time Calculation (min) 43 min    Activity Tolerance Patient tolerated treatment well    Behavior During Therapy [facility] for tasks assessed/performed             Past Medical History:  Diagnosis Date   Hypertension    Pneumonia     History reviewed. No pertinent surgical history.  There were no vitals filed for this visit.   Subjective Assessment - 02/17/21 1345     Subjective Pt denies pain; no new change this rx.    Currently in Pain? No/denies                               [facility] Adult PT Treatment/Exercise - 02/17/21 0001       Knee/Hip Exercises: Aerobic   Nustep L5 LE/UE x 64mn    Other Aerobic L 2.5 x3 min each way      Knee/Hip Exercises: Machines for Strengthening   Cybex Knee Extension 10# 2x10 BLE    Cybex Knee Flexion 20# 2x10 BLE    Other Machine rows and lats 20# 2x10; standing shoulder extension 5# 2x10; 15# standing rows 2x15      Knee/Hip Exercises: Standing   Heel Raises Both;1 set;15 reps    Walking with Sports Cord resisted gait 20# x5 fwd/bkwd                      PT Short Term Goals - 12/02/20 0910       PT SHORT TERM GOAL #1   Title Pt will be I with initial HEP    Baseline reports intermittently performing HEP    Time 2    Period Weeks    Status On-going               PT Long Term Goals - 12/23/20 0945       PT LONG TERM GOAL #1   Title Pt will demo TUG <15 sec with LRAD and gait WFL    Baseline 12.14 sec with no AD; some  weaving no LOB    Time 8    Period Weeks    Status Achieved      PT LONG TERM GOAL #2   Title Pt will demo gait x100 ft around clinic with LRAD and gait WFL    Baseline gait x200 ft with no AD, no LOB but weaving with gait    Time 8    Period Weeks    Status Partially Met      PT LONG TERM GOAL #3   Title Pt will report no additional falls    Baseline reports no additional falls    Time 8    Period Weeks    Status Achieved      PT LONG TERM GOAL #4   Title Pt will demo Berg Balance score improved to 52/56    Baseline 47/56    Time  8    Period Weeks    Status Partially Met                   Plan - 02/17/21 1428     Clinical Impression Statement Pt tolerated progression of TE. Cues for increased step length with resisted gait. Tendency for small, shuffling steps especially with backwards walking. Close supervision-CGA with all high level balance ex's. Discuss continuation of PT with pt and daughter at next rx; would like to d/c with updated HEP at last scheduled appt.    PT Treatment/Interventions ADLs/Self Care Home Management;Neuromuscular re-education;Balance training;Therapeutic exercise;Therapeutic activities;Functional mobility training;Gait training;Stair training;Patient/family education    PT Next Visit Plan balance ex's, endurance, gait training. Fit test requirements for pt can be found in folder in gym    Consulted and Agree with Plan of Care Patient             Patient will benefit from skilled therapeutic intervention in order to improve the following deficits and impairments:  Abnormal gait, Decreased range of motion, Difficulty walking, Decreased endurance, Decreased safety awareness, Decreased activity tolerance, Decreased balance, Decreased mobility, Decreased strength, Postural dysfunction  Visit Diagnosis: Repeated falls  Other abnormalities of gait and mobility  Difficulty in walking, not elsewhere classified  Muscle weakness  (generalized)     Problem List Patient Active Problem List   Diagnosis Date Noted   Chronic sinusitis 12/21/2020   Asthmatic bronchitis , chronic (HCC) 12/01/2020   Chronic cough 11/30/2020   Amador Cunas, PT, DPT Donald Prose Zailah Zagami 02/17/2021, 2:31 PM  [place]. [place], [place], [address] Phone: [phone]   Fax:  [phone]  Name: Shawna Lowe MRN: [id number] Date of Birth: 12-24-1950

## 2021-02-22 ENCOUNTER — Ambulatory Visit: Payer: Medicare HMO | Admitting: Physical Therapy

## 2021-02-22 ENCOUNTER — Other Ambulatory Visit: Payer: Self-pay

## 2021-02-22 ENCOUNTER — Encounter: Payer: Self-pay | Admitting: Physical Therapy

## 2021-02-22 DIAGNOSIS — R296 Repeated falls: Secondary | ICD-10-CM | POA: Diagnosis not present

## 2021-02-22 DIAGNOSIS — R2689 Other abnormalities of gait and mobility: Secondary | ICD-10-CM

## 2021-02-22 DIAGNOSIS — R262 Difficulty in walking, not elsewhere classified: Secondary | ICD-10-CM

## 2021-02-22 DIAGNOSIS — M6281 Muscle weakness (generalized): Secondary | ICD-10-CM

## 2021-02-22 NOTE — Therapy (Signed)
[place]. [place], [place], [address] Phone: [phone]   Fax:  [phone]  Physical Therapy Treatment Progress Note Reporting Period 12/30/20 to 02/22/21  See note below for Objective Data and Assessment of Progress/Goals.     Patient Details  Name: Shawna Lowe MRN: [id number] Date of Birth: 05-03-1951 Referring Provider (PT): Moshe Cipro   Encounter Date: 02/22/2021   PT End of Session - 02/22/21 6010     Visit Number 20    Date for PT Re-Evaluation 03/17/21    Authorization Type [employer]    Authorization Time Period 03/28/21 with [employer]    PT Start Time 1514    PT Stop Time 1555    PT Time Calculation (min) 41 min    Activity Tolerance Patient tolerated treatment well    Behavior During Therapy [facility] for tasks assessed/performed             Past Medical History:  Diagnosis Date   Hypertension    Pneumonia     History reviewed. No pertinent surgical history.  There were no vitals filed for this visit.   Subjective Assessment - 02/22/21 1514     Subjective Pt denies pain; no new change this rx.    Currently in Pain? No/denies                [facility] PT Assessment - 02/22/21 0001       Berg Balance Test   Sit to Stand Able to stand without using hands and stabilize independently    Standing Unsupported Able to stand safely 2 minutes    Sitting with Back Unsupported but Feet Supported on Floor or Stool Able to sit safely and securely 2 minutes    Stand to Sit Sits safely with minimal use of hands    Transfers Able to transfer safely, minor use of hands    Standing Unsupported with Eyes Closed Able to stand 10 seconds safely    Standing Unsupported with Feet Together Able to place feet together independently and stand 1 minute safely    From Standing, Reach Forward with Outstretched Arm Can reach forward >12 cm safely (5")    From Standing Position, Pick up Object from Floor Able to  pick up shoe safely and easily    From Standing Position, Turn to Look Behind Over each Shoulder Looks behind from both sides and weight shifts well    Turn 360 Degrees Able to turn 360 degrees safely one side only in 4 seconds or less    Standing Unsupported, Alternately Place Feet on Step/Stool Able to stand independently and complete 8 steps >20 seconds    Standing Unsupported, One Foot in Front Able to take small step independently and hold 30 seconds    Standing on One Leg Tries to lift leg/unable to hold 3 seconds but remains standing independently    Total Score 48                           [facility] Adult PT Treatment/Exercise - 02/22/21 0001       Knee/Hip Exercises: Aerobic   Nustep L5 LE/UE x 28mn    Other Aerobic L 2.5 x3 min each way      Knee/Hip Exercises: Standing   Heel Raises Both;2 sets;15 reps    Heel Raises Limitations 3#    Knee Flexion Both;1 set;10 reps    Knee Flexion Limitations 3#    Hip  Flexion Both;1 set;10 reps    Hip Flexion Limitations 3#    Hip Abduction Both;1 set;10 reps    Abduction Limitations 3#    Hip Extension Both;1 set;10 reps    Extension Limitations 3#      Knee/Hip Exercises: Seated   Sit to Sand 2 sets;10 reps;without UE support   yellow ball chest press and OHP                     PT Short Term Goals - 12/02/20 0910       PT SHORT TERM GOAL #1   Title Pt will be I with initial HEP    Baseline reports intermittently performing HEP    Time 2    Period Weeks    Status On-going               PT Long Term Goals - 02/22/21 1537       PT LONG TERM GOAL #1   Title Pt will demo TUG <15 sec with LRAD and gait WFL    Baseline 12.14 sec with no AD; some weaving no LOB    Time 8    Period Weeks    Status Achieved      PT LONG TERM GOAL #2   Title Pt will demo gait x100 ft around clinic with LRAD and gait WFL    Time 8    Period Weeks    Status Achieved      PT LONG TERM GOAL #3   Title Pt  will report no additional falls    Baseline reports no additional falls    Time 8    Period Weeks    Status Achieved      PT LONG TERM GOAL #4   Title Pt will demo Berg Balance score improved to 52/56    Baseline 48/56    Time 8    Period Weeks    Status Partially Met                   Plan - 02/22/21 1605     Clinical Impression Statement Pt making good progress toward LTGs. Demos improved Berg Balance score to 48/56. Has met all other LTGs. Pt daughter still unsure about when pt will have follow up testing for transplant. Discussed decreasing frequency to 1x/week. Really emphasized the need to find out when follow up appt is for transplant in order to have target discharge date with VU and agreement. Pt does require close supervision-CGA with high level balance activities.    PT Treatment/Interventions ADLs/Self Care Home Management;Neuromuscular re-education;Balance training;Therapeutic exercise;Therapeutic activities;Functional mobility training;Gait training;Stair training;Patient/family education    PT Next Visit Plan balance ex's, endurance, gait training. Fit test requirements for pt can be found in folder in gym    Consulted and Agree with Plan of Care Patient             Patient will benefit from skilled therapeutic intervention in order to improve the following deficits and impairments:  Abnormal gait, Decreased range of motion, Difficulty walking, Decreased endurance, Decreased safety awareness, Decreased activity tolerance, Decreased balance, Decreased mobility, Decreased strength, Postural dysfunction  Visit Diagnosis: Repeated falls  Other abnormalities of gait and mobility  Difficulty in walking, not elsewhere classified  Muscle weakness (generalized)     Problem List Patient Active Problem List   Diagnosis Date Noted   Chronic sinusitis 12/21/2020   Asthmatic bronchitis , chronic (HCC) 12/01/2020   Chronic cough 11/30/2020  Amador Cunas, PT,  DPT Donald Prose Hurman Ketelsen 02/22/2021, 4:08 PM  [place]. [place], [place], [address] Phone: [phone]   Fax:  [phone]  Name: Shawna Lowe MRN: [id number] Date of Birth: 09/26/1950

## 2021-02-23 ENCOUNTER — Encounter: Payer: Self-pay | Admitting: Internal Medicine

## 2021-02-24 ENCOUNTER — Other Ambulatory Visit: Payer: Self-pay

## 2021-02-24 ENCOUNTER — Encounter: Payer: Self-pay | Admitting: Physical Therapy

## 2021-02-24 ENCOUNTER — Ambulatory Visit: Payer: Medicare HMO | Admitting: Physical Therapy

## 2021-02-24 DIAGNOSIS — R296 Repeated falls: Secondary | ICD-10-CM

## 2021-02-24 DIAGNOSIS — R2689 Other abnormalities of gait and mobility: Secondary | ICD-10-CM

## 2021-02-24 DIAGNOSIS — R262 Difficulty in walking, not elsewhere classified: Secondary | ICD-10-CM

## 2021-02-24 DIAGNOSIS — M6281 Muscle weakness (generalized): Secondary | ICD-10-CM

## 2021-02-24 NOTE — Therapy (Signed)
[place]. [place], [place], [address] Phone: [phone]   Fax:  [phone]  Physical Therapy Treatment  Patient Details  Name: Shevelle Smither MRN: [id number] Date of Birth: [birth year] Referring Provider (PT): Moshe Cipro   Encounter Date: 02/24/2021   PT End of Session - 02/24/21 1649     Visit Number 21    Date for PT Re-Evaluation 03/17/21    PT Start Time 1602    PT Stop Time 1644    PT Time Calculation (min) 42 min    Activity Tolerance Patient tolerated treatment well    Behavior During Therapy [facility] for tasks assessed/performed             Past Medical History:  Diagnosis Date   Hypertension    Pneumonia     History reviewed. No pertinent surgical history.  There were no vitals filed for this visit.   Subjective Assessment - 02/24/21 1606     Subjective Pt denies pain; no new change this rx.    Currently in Pain? No/denies                               [facility] Adult PT Treatment/Exercise - 02/24/21 0001       High Level Balance   High Level Balance Comments small marches on airex 2x10 close supervision-CGA, rhomberg on foam EO/EC x20 sec each      Knee/Hip Exercises: Aerobic   Nustep L5 LE/UE x 44mn    Other Aerobic L 2.5 x3 min each way      Knee/Hip Exercises: Machines for Strengthening   Other Machine rows and lats 20# 2x10; standing shoulder extension 5# 2x10; 15# standing rows 2x15      Knee/Hip Exercises: Standing   Heel Raises Both;1 set;15 reps    Heel Raises Limitations 3#    Knee Flexion Both;1 set;10 reps    Knee Flexion Limitations 3#    Hip Flexion Both;1 set;10 reps    Hip Flexion Limitations 3#    Hip Abduction Both;1 set;10 reps    Abduction Limitations 3#    Hip Extension Both;1 set;10 reps    Extension Limitations 3#      Knee/Hip Exercises: Seated   Sit to Sand 2 sets;10 reps;without UE support   4# chest press and OHP                      PT Short Term Goals - 12/02/20 0910       PT SHORT TERM GOAL #1   Title Pt will be I with initial HEP    Baseline reports intermittently performing HEP    Time 2    Period Weeks    Status On-going               PT Long Term Goals - 02/22/21 1537       PT LONG TERM GOAL #1   Title Pt will demo TUG <15 sec with LRAD and gait WFL    Baseline 12.14 sec with no AD; some weaving no LOB    Time 8    Period Weeks    Status Achieved      PT LONG TERM GOAL #2   Title Pt will demo gait x100 ft around clinic with LRAD and gait WFL    Time 8    Period Weeks    Status Achieved  PT LONG TERM GOAL #3   Title Pt will report no additional falls    Baseline reports no additional falls    Time 8    Period Weeks    Status Achieved      PT LONG TERM GOAL #4   Title Pt will demo Berg Balance score improved to 52/56    Baseline 48/56    Time 8    Period Weeks    Status Partially Met                   Plan - 02/24/21 1649     Clinical Impression Statement Pt required close supervision-CGA with high level balance ex's. Cues for form with standing hip ex's. Focusing on high level balance and functional strengthening ex's to prepare pt for transplant fitness testing. Plan to continue PT 1x/week.    PT Treatment/Interventions ADLs/Self Care Home Management;Neuromuscular re-education;Balance training;Therapeutic exercise;Therapeutic activities;Functional mobility training;Gait training;Stair training;Patient/family education    PT Next Visit Plan balance ex's, endurance, gait training. Fit test requirements for pt can be found in folder in gym    Consulted and Agree with Plan of Care Patient             Patient will benefit from skilled therapeutic intervention in order to improve the following deficits and impairments:  Abnormal gait, Decreased range of motion, Difficulty walking, Decreased endurance, Decreased safety awareness, Decreased activity  tolerance, Decreased balance, Decreased mobility, Decreased strength, Postural dysfunction  Visit Diagnosis: Other abnormalities of gait and mobility  Repeated falls  Difficulty in walking, not elsewhere classified  Muscle weakness (generalized)     Problem List Patient Active Problem List   Diagnosis Date Noted   Chronic sinusitis 12/21/2020   Asthmatic bronchitis , chronic (HCC) 12/01/2020   Chronic cough 11/30/2020   Amador Cunas, PT, DPT Donald Prose Ziyah Cordoba 02/24/2021, 4:51 PM  [place]. [place], [place], [address] Phone: [phone]   Fax:  [phone]  Name: Yexalen Deike MRN: [id number] Date of Birth: 08-Mar-1951

## 2021-03-01 ENCOUNTER — Other Ambulatory Visit: Payer: Self-pay

## 2021-03-01 ENCOUNTER — Ambulatory Visit: Payer: Medicare HMO

## 2021-03-01 DIAGNOSIS — R262 Difficulty in walking, not elsewhere classified: Secondary | ICD-10-CM

## 2021-03-01 DIAGNOSIS — M6281 Muscle weakness (generalized): Secondary | ICD-10-CM

## 2021-03-01 DIAGNOSIS — R2689 Other abnormalities of gait and mobility: Secondary | ICD-10-CM

## 2021-03-01 DIAGNOSIS — R296 Repeated falls: Secondary | ICD-10-CM

## 2021-03-01 NOTE — Therapy (Signed)
[place]. [place], [place], [address] Phone: [phone]   Fax:  [phone]  Physical Therapy Treatment  Patient Details  Name: Shawna Lowe MRN: [id number] Date of Birth: [birth year] Referring Provider (PT): Moshe Cipro   Encounter Date: 03/01/2021   PT End of Session - 03/01/21 1621     Visit Number 22    Date for PT Re-Evaluation 03/17/21    PT Start Time 1618    PT Stop Time 1658    PT Time Calculation (min) 40 min    Activity Tolerance Patient tolerated treatment well    Behavior During Therapy [facility] for tasks assessed/performed             Past Medical History:  Diagnosis Date   Hypertension    Pneumonia     No past surgical history on file.  There were no vitals filed for this visit.   Subjective Assessment - 03/01/21 1621     Subjective Pt denies pain; feels ok today.    Currently in Pain? No/denies                               [facility] Adult PT Treatment/Exercise - 03/01/21 0001       High Level Balance   High Level Balance Comments small marches on airex x20 close supervision-CGA, rhomberg on foam EO/EC x25 sec each, walking high knees attempting slowly      Knee/Hip Exercises: Aerobic   Nustep L5 LE/UE x 73mn    Other Aerobic L 2.5 x3 min each way      Knee/Hip Exercises: Machines for Strengthening   Other Machine rows and lats 20# 2x10; standing shoulder extension 5# 2x10; 10# standing rows 2x15      Knee/Hip Exercises: Standing   Heel Raises Both;1 set;15 reps    Heel Raises Limitations 3#    Knee Flexion Both;1 set;10 reps    Knee Flexion Limitations 3#    Hip Flexion Both;1 set;10 reps    Hip Flexion Limitations 3#    Hip Abduction Both;1 set;10 reps    Abduction Limitations 3#    Hip Extension Both;1 set;10 reps    Extension Limitations 3#      Knee/Hip Exercises: Seated   Sit to Sand 2 sets;10 reps;without UE support   4# chest press and OHP                      PT Short Term Goals - 12/02/20 0910       PT SHORT TERM GOAL #1   Title Pt will be I with initial HEP    Baseline reports intermittently performing HEP    Time 2    Period Weeks    Status On-going               PT Long Term Goals - 02/22/21 1537       PT LONG TERM GOAL #1   Title Pt will demo TUG <15 sec with LRAD and gait WFL    Baseline 12.14 sec with no AD; some weaving no LOB    Time 8    Period Weeks    Status Achieved      PT LONG TERM GOAL #2   Title Pt will demo gait x100 ft around clinic with LRAD and gait WFL    Time 8    Period Weeks    Status Achieved  PT LONG TERM GOAL #3   Title Pt will report no additional falls    Baseline reports no additional falls    Time 8    Period Weeks    Status Achieved      PT LONG TERM GOAL #4   Title Pt will demo Berg Balance score improved to 52/56    Baseline 48/56    Time 8    Period Weeks    Status Partially Met                   Plan - 03/01/21 1622     Clinical Impression Statement Initiated walking high knees with pt demonstrating difficulty with R LE lift secondary to poor L limb stability, requiring SBA/CGA. She did well with eyes open romberg, but required intermittent min A to correct balance with eyes closed on airex. Pt tolerated tx well with no adverse reactions. Will continue to see pt 1x/week and work on endurancee, strength, and balance.    PT Treatment/Interventions ADLs/Self Care Home Management;Neuromuscular re-education;Balance training;Therapeutic exercise;Therapeutic activities;Functional mobility training;Gait training;Stair training;Patient/family education    PT Next Visit Plan balance ex's, endurance, gait training. Fit test requirements for pt can be found in folder in gym    Consulted and Agree with Plan of Care Patient             Patient will benefit from skilled therapeutic intervention in order to improve the following deficits and  impairments:  Abnormal gait, Decreased range of motion, Difficulty walking, Decreased endurance, Decreased safety awareness, Decreased activity tolerance, Decreased balance, Decreased mobility, Decreased strength, Postural dysfunction  Visit Diagnosis: Other abnormalities of gait and mobility  Repeated falls  Difficulty in walking, not elsewhere classified  Muscle weakness (generalized)     Problem List Patient Active Problem List   Diagnosis Date Noted   Chronic sinusitis 12/21/2020   Asthmatic bronchitis , chronic (HCC) 12/01/2020   Chronic cough 11/30/2020    Izell [place], PT, DPT 03/01/2021, 5:00 PM  [place]. [place], [place], [address] Phone: [phone]   Fax:  [phone]  Name: Shawna Lowe MRN: [id number] Date of Birth: 06/21/1951

## 2021-03-10 ENCOUNTER — Other Ambulatory Visit: Payer: Self-pay

## 2021-03-10 ENCOUNTER — Encounter: Payer: Self-pay | Admitting: Physical Therapy

## 2021-03-10 ENCOUNTER — Ambulatory Visit: Payer: Medicare HMO | Attending: Nephrology | Admitting: Physical Therapy

## 2021-03-10 DIAGNOSIS — R2689 Other abnormalities of gait and mobility: Secondary | ICD-10-CM | POA: Insufficient documentation

## 2021-03-10 DIAGNOSIS — R296 Repeated falls: Secondary | ICD-10-CM | POA: Diagnosis present

## 2021-03-10 DIAGNOSIS — R262 Difficulty in walking, not elsewhere classified: Secondary | ICD-10-CM | POA: Insufficient documentation

## 2021-03-10 DIAGNOSIS — M6281 Muscle weakness (generalized): Secondary | ICD-10-CM | POA: Insufficient documentation

## 2021-03-10 NOTE — Therapy (Signed)
Shawna Lowe, [place], [address] Phone: [phone]   Fax:  [phone]  Physical Therapy Treatment  Patient Details  Name: Shawna Lowe MRN: [id number] Date of Birth: 15-Jun-1951 Referring Provider (PT): Moshe Cipro   Encounter Date: 03/10/2021   PT End of Session - 03/10/21 1420     Visit Number 23    Date for PT Re-Evaluation 03/17/21    PT Start Time 3474    PT Stop Time 1423    PT Time Calculation (min) 45 min    Activity Tolerance Patient tolerated treatment well    Behavior During Therapy [facility] for tasks assessed/performed             Past Medical History:  Diagnosis Date   Hypertension    Pneumonia     History reviewed. No pertinent surgical history.  There were no vitals filed for this visit.   Subjective Assessment - 03/10/21 1344     Subjective Pt denies pain; feels ok today.    Currently in Pain? No/denies                               [facility] Adult PT Treatment/Exercise - 03/10/21 0001       Knee/Hip Exercises: Aerobic   Nustep L5 LE/UE x 43mn    Other Aerobic L 2.5 x3 min each way      Knee/Hip Exercises: Machines for Strengthening   Cybex Knee Extension 10# 2x10 BLE    Cybex Knee Flexion 20# 2x10 BLE    Other Machine rows and lats 20# 2x10; standing shoulder extension 5# 2x10; 10# standing rows 2x15      Knee/Hip Exercises: Standing   Heel Raises Both;1 set;15 reps    Heel Raises Limitations 3#    Knee Flexion Both;1 set;10 reps    Knee Flexion Limitations 3#    Hip Flexion Both;1 set;10 reps    Hip Flexion Limitations 3#    Hip Abduction Both;1 set;10 reps    Abduction Limitations 3#    Hip Extension Both;1 set;10 reps    Extension Limitations 3#      Knee/Hip Exercises: Seated   Sit to Sand 2 sets;10 reps;without UE support   3# chest press and OHP                     PT Short Term Goals - 12/02/20 0910       PT SHORT  TERM GOAL #1   Title Pt will be I with initial HEP    Baseline reports intermittently performing HEP    Time 2    Period Weeks    Status On-going               PT Long Term Goals - 02/22/21 1537       PT LONG TERM GOAL #1   Title Pt will demo TUG <15 sec with LRAD and gait WFL    Baseline 12.14 sec with no AD; some weaving no LOB    Time 8    Period Weeks    Status Achieved      PT LONG TERM GOAL #2   Title Pt will demo gait x100 ft around clinic with LRAD and gait WFL    Time 8    Period Weeks    Status Achieved      PT LONG TERM GOAL #3   Title Pt will  report no additional falls    Baseline reports no additional falls    Time 8    Period Weeks    Status Achieved      PT LONG TERM GOAL #4   Title Pt will demo Berg Balance score improved to 52/56    Baseline 48/56    Time 8    Period Weeks    Status Partially Met                   Plan - 03/10/21 1421     Clinical Impression Statement Pt demos good tolerance to TE this rx. Cues for posture/form with standing shoulder extensions. Cues to avoid postural sway with seated rows. Close supervision-CGA for high level balance activities. Continue to progress to tolerance.    PT Treatment/Interventions ADLs/Self Care Home Management;Neuromuscular re-education;Balance training;Therapeutic exercise;Therapeutic activities;Functional mobility training;Gait training;Stair training;Patient/family education    PT Next Visit Plan balance ex's, endurance, gait training. Fit test requirements for pt can be found in folder in gym    Consulted and Agree with Plan of Care Patient             Patient will benefit from skilled therapeutic intervention in order to improve the following deficits and impairments:  Abnormal gait, Decreased range of motion, Difficulty walking, Decreased endurance, Decreased safety awareness, Decreased activity tolerance, Decreased balance, Decreased mobility, Decreased strength, Postural  dysfunction  Visit Diagnosis: Other abnormalities of gait and mobility  Repeated falls  Difficulty in walking, not elsewhere classified  Muscle weakness (generalized)     Problem List Patient Active Problem List   Diagnosis Date Noted   Chronic sinusitis 12/21/2020   Asthmatic bronchitis , chronic (HCC) 12/01/2020   Chronic cough 11/30/2020   Amador Cunas, PT, DPT Donald Prose Orazio Weller 03/10/2021, 2:27 PM  [place]. [place], [place], [address] Phone: [phone]   Fax:  [phone]  Name: Shawna Lowe MRN: [id number] Date of Birth: 04-Feb-1951

## 2021-03-17 ENCOUNTER — Encounter: Payer: Self-pay | Admitting: Physical Therapy

## 2021-03-17 ENCOUNTER — Ambulatory Visit: Payer: Medicare HMO | Admitting: Physical Therapy

## 2021-03-17 ENCOUNTER — Other Ambulatory Visit: Payer: Self-pay

## 2021-03-17 DIAGNOSIS — R262 Difficulty in walking, not elsewhere classified: Secondary | ICD-10-CM

## 2021-03-17 DIAGNOSIS — R296 Repeated falls: Secondary | ICD-10-CM

## 2021-03-17 DIAGNOSIS — R2689 Other abnormalities of gait and mobility: Secondary | ICD-10-CM

## 2021-03-17 DIAGNOSIS — M6281 Muscle weakness (generalized): Secondary | ICD-10-CM

## 2021-03-17 NOTE — Therapy (Signed)
[place] [place], [place], [address] Phone: [phone]   Fax:  [phone]  Physical Therapy Treatment  Patient Details  Name: Shawna Lowe MRN: [id number] Date of Birth: May 27, 1951 Referring Provider (PT): Moshe Cipro   Encounter Date: 03/17/2021   PT End of Session - 03/17/21 1649     Visit Number 24    Date for PT Re-Evaluation 03/17/21    PT Start Time 1601    PT Stop Time 1641    PT Time Calculation (min) 40 min    Activity Tolerance Patient tolerated treatment well    Behavior During Therapy [facility] for tasks assessed/performed             Past Medical History:  Diagnosis Date   Hypertension    Pneumonia     History reviewed. No pertinent surgical history.  There were no vitals filed for this visit.   Subjective Assessment - 03/17/21 1614     Subjective Pt denies pain; feels ok today.    Currently in Pain? No/denies                               [facility] Adult PT Treatment/Exercise - 03/17/21 0001       Knee/Hip Exercises: Aerobic   Nustep L5 LE/UE x 60mn    Other Aerobic L 2.5 x3 min each way      Knee/Hip Exercises: Machines for Strengthening   Cybex Knee Extension 10# 2x10 BLE    Cybex Knee Flexion 20# 2x10 BLE    Other Machine rows and lats 20# 2x10; standing shoulder extension 5# 2x10; 10# standing rows 2x15      Knee/Hip Exercises: Standing   Heel Raises Both;1 set;15 reps    Heel Raises Limitations 3#    Knee Flexion Both;1 set;10 reps    Knee Flexion Limitations 3#    Hip Flexion Both;1 set;10 reps    Hip Flexion Limitations 3#    Hip Abduction Both;1 set;10 reps    Abduction Limitations 3#    Hip Extension Both;1 set;10 reps    Extension Limitations 3#      Knee/Hip Exercises: Seated   Sit to Sand 2 sets;10 reps;without UE support   with 4# chest press and OHP                     PT Short Term Goals - 12/02/20 0910       PT  SHORT TERM GOAL #1   Title Pt will be I with initial HEP    Baseline reports intermittently performing HEP    Time 2    Period Weeks    Status On-going               PT Long Term Goals - 02/22/21 1537       PT LONG TERM GOAL #1   Title Pt will demo TUG <15 sec with LRAD and gait WFL    Baseline 12.14 sec with no AD; some weaving no LOB    Time 8    Period Weeks    Status Achieved      PT LONG TERM GOAL #2   Title Pt will demo gait x100 ft around clinic with LRAD and gait WFL    Time 8    Period Weeks    Status Achieved      PT LONG TERM GOAL #3   Title Pt  will report no additional falls    Baseline reports no additional falls    Time 8    Period Weeks    Status Achieved      PT LONG TERM GOAL #4   Title Pt will demo Berg Balance score improved to 52/56    Baseline 48/56    Time 8    Period Weeks    Status Partially Met                   Plan - 03/17/21 1649     Clinical Impression Statement Pt demos good tolerance to TE this rx. Cues to avoid postural sway with seated rows. Pt contines to demo some postural sway with gait. Close supervision with high level balance ex's; no LOB this rx. Discuss with pt and family plans for continuation of PT next rx.    PT Treatment/Interventions ADLs/Self Care Home Management;Neuromuscular re-education;Balance training;Therapeutic exercise;Therapeutic activities;Functional mobility training;Gait training;Stair training;Patient/family education    PT Next Visit Plan balance ex's, endurance, gait training. Fit test requirements for pt can be found in folder in gym    Consulted and Agree with Plan of Care Patient             Patient will benefit from skilled therapeutic intervention in order to improve the following deficits and impairments:  Abnormal gait, Decreased range of motion, Difficulty walking, Decreased endurance, Decreased safety awareness, Decreased activity tolerance, Decreased balance, Decreased mobility,  Decreased strength, Postural dysfunction  Visit Diagnosis: Other abnormalities of gait and mobility  Repeated falls  Difficulty in walking, not elsewhere classified  Muscle weakness (generalized)     Problem List Patient Active Problem List   Diagnosis Date Noted   Chronic sinusitis 12/21/2020   Asthmatic bronchitis , chronic (HCC) 12/01/2020   Chronic cough 11/30/2020   Amador Cunas, PT, DPT Donald Prose Luverna Degenhart 03/17/2021, 4:51 PM  [place] [place]. [place], [place], [address] Phone: [phone]   Fax:  [phone]  Name: Shawna Lowe MRN: [id number] Date of Birth: Nov 25, 1950

## 2021-03-22 ENCOUNTER — Ambulatory Visit: Payer: Medicare HMO | Admitting: Internal Medicine

## 2021-03-24 ENCOUNTER — Ambulatory Visit: Payer: Medicare HMO | Admitting: Physical Therapy

## 2021-11-07 NOTE — Progress Notes (Signed)
[facility] [place] ? ?ASSESSMENT / PLAN: ?71 y.o. 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 Plan AV fistula revision and tunneled dialysis catheter placement in OR 11/17/21.  ? ?CHIEF COMPLAINT: Ulcerated left arm arteriovenous fistula ? ?HISTORY OF PRESENT ILLNESS: ?Shawna Lowe is a 71 y.o. female medical history of end-stage renal disease on dialysis Monday, Wednesday, and Friday via left upper extremity brachiocephalic arteriovenous fistula created at another facility.  She does not speak any [place].  [place] is [place].  Her daughter acts as a Optometrist today.  Patient has noticed worsening ulceration about her arm over the past several weeks.  Her dialysis center has been reluctant to put Emla cream on it.  Otherwise dialysis is going okay. ? ?Past Medical History:  ?Diagnosis Date  ? Chronic kidney disease   ? Hypertension   ? Pneumonia   ? ? ?Past Surgical History:  ?Procedure Laterality Date  ? AV FISTULA PLACEMENT    ? avf Left   ? ? ?History reviewed. No pertinent family history. ? ?Social History  ? ?Socioeconomic History  ? Marital status: Married  ?  Spouse name: Not on file  ? Number of children: Not on file  ? Years of education: Not on file  ? Highest education level: Not on file  ?Occupational History  ? Not on file  ?Tobacco Use  ? Smoking status: Never  ? Smokeless tobacco: Never  ?Vaping Use  ? Vaping Use: Never used  ?Substance and Sexual Activity  ? Alcohol use: Not Currently  ? Drug use: Not on file  ? Sexual activity: Not on file  ?Other Topics Concern  ? Not on file  ?Social History Narrative  ? Not on file  ? ?Social Determinants of Health  ? ?Financial Resource Strain: Not on file  ?Food Insecurity: Not on file  ?Transportation Needs: Not on file  ?Physical Activity: Not on file  ?Stress: Not on file  ?Social Connections: Not on file  ?Intimate Partner Violence: Not on file  ? ? ?Allergies  ?Allergen Reactions  ? Mosquito  (Culex Pipiens) Allergy Skin Test Rash and Shortness Of Breath  ?  And  Trouble  breathing ?And  Trouble  breathing ?  ? Lisinopril Hives and Rash  ?  Patient cannot remember the exact nature of the allergy  ? Penicillins Cough, Rash and Swelling  ?  Swelling,  numbness ?numbness ?Swelling,  numbness ?  ? Shellfish-Derived Products Hives and Rash  ?  New  allergy ?New  allergy ?New  allergy ?  ? ? ?Current Outpatient Medications  ?Medication Sig Dispense Refill  ? aspirin 325 MG tablet Take 325 mg by mouth daily.    ? atorvastatin (LIPITOR) 80 MG tablet Take 80 mg by mouth daily.    ? glipiZIDE (GLUCOTROL) 10 MG tablet Take 10 mg by mouth 2 (two) times daily before a meal.    ? Insulin Glargine (BASAGLAR KWIKPEN) 100 UNIT/ML Inject 15 Units into the skin daily.    ? insulin glargine (LANTUS) 100 UNIT/ML injection Inject 30 Units into the skin daily.    ? losartan (COZAAR) 100 MG tablet Take 100 mg by mouth daily.    ? pantoprazole (PROTONIX) 40 MG tablet Take 40 mg by mouth daily.    ? sevelamer carbonate (RENVELA) 800 MG tablet Take 800 mg by mouth 3 (three) times daily with meals.    ? albuterol (PROVENTIL) (2.5 MG/3ML) 0.083% nebulizer solution Take 2.5 mg by nebulization every  6 (six) hours as needed for wheezing or shortness of breath. (Patient not taking: Reported on 12/21/2020)    ? azelastine (ASTELIN) 0.1 % nasal spray Place 1 spray into both nostrils 2 (two) times daily. Use in each nostril as directed (Patient not taking: Reported on 11/08/2021) 30 mL 3  ? benzonatate (TESSALON) 100 MG capsule Take 100 mg by mouth 2 (two) times daily as needed for cough. (Patient not taking: Reported on 12/21/2020)    ? fluticasone (FLONASE) 50 MCG/ACT nasal spray Place into both nostrils daily. (Patient not taking: Reported on 11/08/2021)    ? metoprolol succinate (TOPROL-XL) 50 MG 24 hr tablet Take 50 mg by mouth daily. Take with or immediately following a meal. (Patient not taking: Reported on 12/21/2020)    ? montelukast  (SINGULAIR) 10 MG tablet Take 10 mg by mouth at bedtime.    ? Olopatadine HCl 0.2 % SOLN Apply to eye daily. (Patient not taking: Reported on 12/21/2020)    ? ?No current facility-administered medications for this visit.  ? ? ?PHYSICAL EXAM ?Vitals:  ? 11/08/21 0818  ?BP: (!) 196/79  ?Pulse: 74  ?Resp: 20  ?Temp: 98 ?F (36.7 ?C)  ?SpO2: 98%  ?Weight: 168 lb 8 oz (76.4 kg)  ?Height: 5\' 1"  (1.549 m)  ? ? ?Constitutional: chronically ill appearing. no distress. Appears well nourished.  ?Neurologic: CN intact. no focal findings. no sensory loss. ?Psychiatric:  Mood and affect symmetric and appropriate. ?Eyes:  No icterus. No conjunctival pallor. ?Ears, nose, throat:  mucous membranes moist. Midline trachea.  ?Cardiac: regular rate and rhythm.  ?Respiratory:  unlabored. ?Abdominal:  soft, non-tender, non-distended.  ?Peripheral vascular: left arm brachiocephalic arteriovenous fistula with normal thrill. 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  Early areas of ulceration noted on the distal fistula. ?Extremity: no edema. no cyanosis. no pallor.  ?Skin: no gangrene.  ?Lymphatic: no Stemmer's sign. no palpable lymphadenopathy. ? ?PERTINENT LABORATORY AND RADIOLOGIC DATA ?None recent ? ?Shawna Lowe. Stanford Breed, MD ?[facility] [place] ?Office Phone Number: [phone] ?11/08/2021 8:37 AM ? ?Total time spent on preparing this encounter including chart review, data review, collecting history, examining the patient, coordinating care for this new patient, 45 minutes. ? ?Portions of this report may have been transcribed using voice recognition software.  Every effort has been made to ensure accuracy; however, inadvertent computerized transcription errors may still be present. ? ? ? ?

## 2021-11-07 NOTE — H&P (View-Only) (Signed)
[facility] [place] ? ?ASSESSMENT / PLAN: ?71 y.o. 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 Plan AV fistula revision and tunneled dialysis catheter placement in OR 11/17/21.  ? ?CHIEF COMPLAINT: Ulcerated left arm arteriovenous fistula ? ?HISTORY OF PRESENT ILLNESS: ?Shawna Lowe is a 71 y.o. female medical history of end-stage renal disease on dialysis Monday, Wednesday, and Friday via left upper extremity brachiocephalic arteriovenous fistula created at another facility.  She does not speak any [place].  She is [place].  Her daughter acts as a Optometrist today.  Patient has noticed worsening ulceration about her arm over the past several weeks.  Her dialysis center has been reluctant to put Emla cream on it.  Otherwise dialysis is going okay. ? ?Past Medical History:  ?Diagnosis Date  ? Chronic kidney disease   ? Hypertension   ? Pneumonia   ? ? ?Past Surgical History:  ?Procedure Laterality Date  ? AV FISTULA PLACEMENT    ? avf Left   ? ? ?History reviewed. No pertinent family history. ? ?Social History  ? ?Socioeconomic History  ? Marital status: Married  ?  Spouse name: Not on file  ? Number of children: Not on file  ? Years of education: Not on file  ? Highest education level: Not on file  ?Occupational History  ? Not on file  ?Tobacco Use  ? Smoking status: Never  ? Smokeless tobacco: Never  ?Vaping Use  ? Vaping Use: Never used  ?Substance and Sexual Activity  ? Alcohol use: Not Currently  ? Drug use: Not on file  ? Sexual activity: Not on file  ?Other Topics Concern  ? Not on file  ?Social History Narrative  ? Not on file  ? ?Social Determinants of Health  ? ?Financial Resource Strain: Not on file  ?Food Insecurity: Not on file  ?Transportation Needs: Not on file  ?Physical Activity: Not on file  ?Stress: Not on file  ?Social Connections: Not on file  ?Intimate Partner Violence: Not on file  ? ? ?Allergies  ?Allergen Reactions  ? Mosquito  (Culex Pipiens) Allergy Skin Test Rash and Shortness Of Breath  ?  And  Trouble  breathing ?And  Trouble  breathing ?  ? Lisinopril Hives and Rash  ?  Patient cannot remember the exact nature of the allergy  ? Penicillins Cough, Rash and Swelling  ?  Swelling,  numbness ?numbness ?Swelling,  numbness ?  ? Shellfish-Derived Products Hives and Rash  ?  New  allergy ?New  allergy ?New  allergy ?  ? ? ?Current Outpatient Medications  ?Medication Sig Dispense Refill  ? aspirin 325 MG tablet Take 325 mg by mouth daily.    ? atorvastatin (LIPITOR) 80 MG tablet Take 80 mg by mouth daily.    ? glipiZIDE (GLUCOTROL) 10 MG tablet Take 10 mg by mouth 2 (two) times daily before a meal.    ? Insulin Glargine (BASAGLAR KWIKPEN) 100 UNIT/ML Inject 15 Units into the skin daily.    ? insulin glargine (LANTUS) 100 UNIT/ML injection Inject 30 Units into the skin daily.    ? losartan (COZAAR) 100 MG tablet Take 100 mg by mouth daily.    ? pantoprazole (PROTONIX) 40 MG tablet Take 40 mg by mouth daily.    ? sevelamer carbonate (RENVELA) 800 MG tablet Take 800 mg by mouth 3 (three) times daily with meals.    ? albuterol (PROVENTIL) (2.5 MG/3ML) 0.083% nebulizer solution Take 2.5 mg by nebulization every  6 (six) hours as needed for wheezing or shortness of breath. (Patient not taking: Reported on 12/21/2020)    ? azelastine (ASTELIN) 0.1 % nasal spray Place 1 spray into both nostrils 2 (two) times daily. Use in each nostril as directed (Patient not taking: Reported on 11/08/2021) 30 mL 3  ? benzonatate (TESSALON) 100 MG capsule Take 100 mg by mouth 2 (two) times daily as needed for cough. (Patient not taking: Reported on 12/21/2020)    ? fluticasone (FLONASE) 50 MCG/ACT nasal spray Place into both nostrils daily. (Patient not taking: Reported on 11/08/2021)    ? metoprolol succinate (TOPROL-XL) 50 MG 24 hr tablet Take 50 mg by mouth daily. Take with or immediately following a meal. (Patient not taking: Reported on 12/21/2020)    ? montelukast  (SINGULAIR) 10 MG tablet Take 10 mg by mouth at bedtime.    ? Olopatadine HCl 0.2 % SOLN Apply to eye daily. (Patient not taking: Reported on 12/21/2020)    ? ?No current facility-administered medications for this visit.  ? ? ?PHYSICAL EXAM ?Vitals:  ? 11/08/21 0818  ?BP: (!) 196/79  ?Pulse: 74  ?Resp: 20  ?Temp: 98 ?F (36.7 ?C)  ?SpO2: 98%  ?Weight: 168 lb 8 oz (76.4 kg)  ?Height: 5\' 1"  (1.549 m)  ? ? ?Constitutional: chronically ill appearing. no distress. Appears well nourished.  ?Neurologic: CN intact. no focal findings. no sensory loss. ?Psychiatric:  Mood and affect symmetric and appropriate. ?Eyes:  No icterus. No conjunctival pallor. ?Ears, nose, throat:  mucous membranes moist. Midline trachea.  ?Cardiac: regular rate and rhythm.  ?Respiratory:  unlabored. ?Abdominal:  soft, non-tender, non-distended.  ?Peripheral vascular: left arm brachiocephalic arteriovenous fistula with normal thrill. 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  Early areas of ulceration noted on the distal fistula. ?Extremity: no edema. no cyanosis. no pallor.  ?Skin: no gangrene.  ?Lymphatic: no Stemmer's sign. no palpable lymphadenopathy. ? ?PERTINENT LABORATORY AND RADIOLOGIC DATA ?None recent ? ?Yevonne Aline. Stanford Breed, MD ?[facility] [place] ?Office Phone Number: [phone] ?11/08/2021 8:37 AM ? ?Total time spent on preparing this encounter including chart review, data review, collecting history, examining the patient, coordinating care for this new patient, 45 minutes. ? ?Portions of this report may have been transcribed using voice recognition software.  Every effort has been made to ensure accuracy; however, inadvertent computerized transcription errors may still be present. ? ? ? ?

## 2021-11-08 ENCOUNTER — Ambulatory Visit: Payer: Medicare HMO | Admitting: Vascular Surgery

## 2021-11-08 ENCOUNTER — Other Ambulatory Visit: Payer: Self-pay

## 2021-11-08 ENCOUNTER — Encounter: Payer: Self-pay | Admitting: Vascular Surgery

## 2021-11-08 VITALS — BP 196/79 | HR 74 | Temp 98.0°F | Resp 20 | Ht 61.0 in | Wt 168.5 lb

## 2021-11-08 DIAGNOSIS — Z992 Dependence on renal dialysis: Secondary | ICD-10-CM

## 2021-11-08 DIAGNOSIS — N186 End stage renal disease: Secondary | ICD-10-CM | POA: Diagnosis not present

## 2021-11-16 NOTE — Progress Notes (Signed)
Pre-op instructions left by voice interpreter Cristela Blue [phone]. ?Arrival to [facility] Entrance "A" at 0530. Nothing to eat or drink after midnight, except for sips with medicines: Asa, Gabapentin, Metoprolol, Protonix. Prn: Xyzal, Tylenol, Tagamet, Flonase, Albuterol Neb/Inhaler. Hold Glipizide tonight and tomorrow in am. Take Lantus 6 units at bedtime. ?

## 2021-11-17 ENCOUNTER — Encounter (HOSPITAL_COMMUNITY): Payer: Self-pay | Admitting: Vascular Surgery

## 2021-11-17 ENCOUNTER — Ambulatory Visit (HOSPITAL_COMMUNITY): Payer: Medicare HMO

## 2021-11-17 ENCOUNTER — Other Ambulatory Visit: Payer: Self-pay

## 2021-11-17 ENCOUNTER — Encounter (HOSPITAL_COMMUNITY)
Admission: RE | Disposition: A | Discharge status: Home / Self Care | Payer: Self-pay | Source: Home / Self Care | Attending: Vascular Surgery

## 2021-11-17 ENCOUNTER — Ambulatory Visit (HOSPITAL_BASED_OUTPATIENT_CLINIC_OR_DEPARTMENT_OTHER): Payer: Medicare HMO | Admitting: Anesthesiology

## 2021-11-17 ENCOUNTER — Ambulatory Visit (HOSPITAL_COMMUNITY): Payer: Medicare HMO | Admitting: Anesthesiology

## 2021-11-17 ENCOUNTER — Ambulatory Visit (HOSPITAL_COMMUNITY)
Admission: RE | Admit: 2021-11-17 | Discharge: 2021-11-17 | Disposition: A | Discharge status: Home / Self Care | Payer: Medicare HMO | Attending: Vascular Surgery | Admitting: Vascular Surgery

## 2021-11-17 DIAGNOSIS — Z79899 Other long term (current) drug therapy: Secondary | ICD-10-CM | POA: Insufficient documentation

## 2021-11-17 DIAGNOSIS — Y841 Kidney dialysis as the cause of abnormal reaction of the patient, or of later complication, without mention of misadventure at the time of the procedure: Secondary | ICD-10-CM | POA: Insufficient documentation

## 2021-11-17 DIAGNOSIS — T82898A Other specified complication of vascular prosthetic devices, implants and grafts, initial encounter: Secondary | ICD-10-CM | POA: Diagnosis present

## 2021-11-17 DIAGNOSIS — N185 Chronic kidney disease, stage 5: Secondary | ICD-10-CM

## 2021-11-17 DIAGNOSIS — N186 End stage renal disease: Secondary | ICD-10-CM | POA: Insufficient documentation

## 2021-11-17 DIAGNOSIS — Z7982 Long term (current) use of aspirin: Secondary | ICD-10-CM | POA: Insufficient documentation

## 2021-11-17 DIAGNOSIS — Z992 Dependence on renal dialysis: Secondary | ICD-10-CM

## 2021-11-17 DIAGNOSIS — Z794 Long term (current) use of insulin: Secondary | ICD-10-CM | POA: Diagnosis not present

## 2021-11-17 DIAGNOSIS — E1122 Type 2 diabetes mellitus with diabetic chronic kidney disease: Secondary | ICD-10-CM | POA: Diagnosis not present

## 2021-11-17 DIAGNOSIS — Z7984 Long term (current) use of oral hypoglycemic drugs: Secondary | ICD-10-CM | POA: Diagnosis not present

## 2021-11-17 DIAGNOSIS — I120 Hypertensive chronic kidney disease with stage 5 chronic kidney disease or end stage renal disease: Secondary | ICD-10-CM

## 2021-11-17 HISTORY — PX: INSERTION OF DIALYSIS CATHETER: SHX1324

## 2021-11-17 HISTORY — PX: REVISON OF ARTERIOVENOUS FISTULA: SHX6074

## 2021-11-17 HISTORY — DX: Type 2 diabetes mellitus without complications: E11.9

## 2021-11-17 HISTORY — DX: Cerebral infarction, unspecified: I63.9

## 2021-11-17 HISTORY — DX: Unspecified asthma, uncomplicated: J45.909

## 2021-11-17 LAB — POCT I-STAT, CHEM 8
BUN: 46 mg/dL — ABNORMAL HIGH (ref 8–23)
Calcium, Ion: 0.95 mmol/L — ABNORMAL LOW (ref 1.15–1.40)
Chloride: 95 mmol/L — ABNORMAL LOW (ref 98–111)
Creatinine, Ser: 7 mg/dL — ABNORMAL HIGH (ref 0.44–1.00)
Glucose, Bld: 119 mg/dL — ABNORMAL HIGH (ref 70–99)
HCT: 33 % — ABNORMAL LOW (ref 36.0–46.0)
Hemoglobin: 11.2 g/dL — ABNORMAL LOW (ref 12.0–15.0)
Potassium: 3.8 mmol/L (ref 3.5–5.1)
Sodium: 135 mmol/L (ref 135–145)
TCO2: 28 mmol/L (ref 22–32)

## 2021-11-17 LAB — GLUCOSE, CAPILLARY
Glucose-Capillary: 124 mg/dL — ABNORMAL HIGH (ref 70–99)
Glucose-Capillary: 160 mg/dL — ABNORMAL HIGH (ref 70–99)

## 2021-11-17 SURGERY — REVISON OF ARTERIOVENOUS FISTULA
Anesthesia: General | Site: Arm Upper

## 2021-11-17 MED ORDER — EPHEDRINE 5 MG/ML INJ
INTRAVENOUS | Status: AC
  Filled 2021-11-17: qty 5

## 2021-11-17 MED ORDER — FENTANYL CITRATE (PF) 250 MCG/5ML IJ SOLN
INTRAMUSCULAR | Status: AC
  Filled 2021-11-17: qty 5

## 2021-11-17 MED ORDER — CHLORHEXIDINE GLUCONATE 4 % EX LIQD: 60.0000 mL | Freq: Once | CUTANEOUS | Status: DC

## 2021-11-17 MED ORDER — ONDANSETRON HCL 4 MG/2ML IJ SOLN: 4.0000 mg | Freq: Four times a day (QID) | INTRAMUSCULAR | Status: DC | PRN

## 2021-11-17 MED ORDER — ONDANSETRON HCL 4 MG/2ML IJ SOLN
INTRAMUSCULAR | Status: AC
  Filled 2021-11-17: qty 2

## 2021-11-17 MED ORDER — SODIUM CHLORIDE 0.9 % IV SOLN: INTRAVENOUS | Status: DC

## 2021-11-17 MED ORDER — LIDOCAINE 2% (20 MG/ML) 5 ML SYRINGE
INTRAMUSCULAR | Status: AC
  Filled 2021-11-17: qty 5

## 2021-11-17 MED ORDER — MIDAZOLAM HCL 2 MG/2ML IJ SOLN
INTRAMUSCULAR | Status: AC
  Filled 2021-11-17: qty 2

## 2021-11-17 MED ORDER — ONDANSETRON HCL 4 MG/2ML IJ SOLN
INTRAMUSCULAR | Status: DC | PRN
  Administered 2021-11-17 (×2): 4 mg via INTRAVENOUS

## 2021-11-17 MED ORDER — HEPARIN 6000 UNIT IRRIGATION SOLUTION
Status: AC
  Filled 2021-11-17: qty 500

## 2021-11-17 MED ORDER — ACETAMINOPHEN 10 MG/ML IV SOLN
INTRAVENOUS | Status: AC
  Filled 2021-11-17: qty 100

## 2021-11-17 MED ORDER — HYDROCODONE-ACETAMINOPHEN 5-325 MG PO TABS: 1.0000 | ORAL_TABLET | Freq: Four times a day (QID) | ORAL | 0 refills | Status: AC | PRN

## 2021-11-17 MED ORDER — VANCOMYCIN HCL IN DEXTROSE 1-5 GM/200ML-% IV SOLN
1000.0000 mg | INTRAVENOUS | Status: AC
  Administered 2021-11-17: 1000 mg via INTRAVENOUS
  Filled 2021-11-17: qty 200

## 2021-11-17 MED ORDER — OXYCODONE HCL 5 MG/5ML PO SOLN: 5.0000 mg | Freq: Once | ORAL | Status: DC | PRN

## 2021-11-17 MED ORDER — METOPROLOL SUCCINATE ER 25 MG PO TB24
50.0000 mg | ORAL_TABLET | Freq: Once | ORAL | Status: AC
  Administered 2021-11-17: 50 mg via ORAL
  Filled 2021-11-17: qty 2

## 2021-11-17 MED ORDER — EPHEDRINE SULFATE-NACL 50-0.9 MG/10ML-% IV SOSY
PREFILLED_SYRINGE | INTRAVENOUS | Status: DC | PRN
  Administered 2021-11-17: 5 mg via INTRAVENOUS
  Administered 2021-11-17 (×2): 10 mg via INTRAVENOUS

## 2021-11-17 MED ORDER — ALBUTEROL SULFATE HFA 108 (90 BASE) MCG/ACT IN AERS
INHALATION_SPRAY | RESPIRATORY_TRACT | Status: DC | PRN
  Administered 2021-11-17: 4 via RESPIRATORY_TRACT

## 2021-11-17 MED ORDER — FENTANYL CITRATE (PF) 100 MCG/2ML IJ SOLN
INTRAMUSCULAR | Status: DC | PRN
  Administered 2021-11-17 (×2): 25 ug via INTRAVENOUS

## 2021-11-17 MED ORDER — PHENYLEPHRINE HCL-NACL 20-0.9 MG/250ML-% IV SOLN
INTRAVENOUS | Status: DC | PRN
  Administered 2021-11-17: 75 ug/min via INTRAVENOUS
  Administered 2021-11-17: 50 ug/min via INTRAVENOUS

## 2021-11-17 MED ORDER — LIDOCAINE HCL (CARDIAC) PF 100 MG/5ML IV SOSY
PREFILLED_SYRINGE | INTRAVENOUS | Status: DC | PRN
  Administered 2021-11-17: 60 mg via INTRAVENOUS

## 2021-11-17 MED ORDER — HEPARIN SODIUM (PORCINE) 1000 UNIT/ML IJ SOLN
INTRAMUSCULAR | Status: DC | PRN
  Administered 2021-11-17: 3200 [IU]

## 2021-11-17 MED ORDER — OXYCODONE HCL 5 MG PO TABS: 5.0000 mg | ORAL_TABLET | Freq: Once | ORAL | Status: DC | PRN

## 2021-11-17 MED ORDER — HEPARIN SODIUM (PORCINE) 1000 UNIT/ML IJ SOLN
INTRAMUSCULAR | Status: DC | PRN
  Administered 2021-11-17: 5000 [IU] via INTRAVENOUS

## 2021-11-17 MED ORDER — CHLORHEXIDINE GLUCONATE 0.12 % MT SOLN
15.0000 mL | Freq: Once | OROMUCOSAL | Status: AC
  Administered 2021-11-17: 15 mL via OROMUCOSAL
  Filled 2021-11-17: qty 15

## 2021-11-17 MED ORDER — ALBUTEROL SULFATE HFA 108 (90 BASE) MCG/ACT IN AERS
INHALATION_SPRAY | RESPIRATORY_TRACT | Status: AC
  Filled 2021-11-17: qty 6.7

## 2021-11-17 MED ORDER — ACETAMINOPHEN 10 MG/ML IV SOLN
1000.0000 mg | Freq: Once | INTRAVENOUS | Status: AC
  Administered 2021-11-17: 1000 mg via INTRAVENOUS

## 2021-11-17 MED ORDER — FENTANYL CITRATE (PF) 100 MCG/2ML IJ SOLN: 25.0000 ug | INTRAMUSCULAR | Status: DC | PRN

## 2021-11-17 MED ORDER — LIDOCAINE-EPINEPHRINE (PF) 1 %-1:200000 IJ SOLN
INTRAMUSCULAR | Status: AC
  Filled 2021-11-17: qty 30

## 2021-11-17 MED ORDER — 0.9 % SODIUM CHLORIDE (POUR BTL) OPTIME
TOPICAL | Status: DC | PRN
  Administered 2021-11-17: 1000 mL

## 2021-11-17 MED ORDER — SUCCINYLCHOLINE CHLORIDE 200 MG/10ML IV SOSY
PREFILLED_SYRINGE | INTRAVENOUS | Status: AC
  Filled 2021-11-17: qty 10

## 2021-11-17 MED ORDER — ORAL CARE MOUTH RINSE: 15.0000 mL | Freq: Once | OROMUCOSAL | Status: AC

## 2021-11-17 MED ORDER — HEPARIN 6000 UNIT IRRIGATION SOLUTION
Status: DC | PRN
  Administered 2021-11-17: 1

## 2021-11-17 MED ORDER — MIDAZOLAM HCL 5 MG/5ML IJ SOLN
INTRAMUSCULAR | Status: DC | PRN
  Administered 2021-11-17: 1 mg via INTRAVENOUS

## 2021-11-17 MED ORDER — ALBUMIN HUMAN 5 % IV SOLN: INTRAVENOUS | Status: DC | PRN

## 2021-11-17 MED ORDER — INSULIN ASPART 100 UNIT/ML IJ SOLN: 0.0000 [IU] | INTRAMUSCULAR | Status: DC | PRN

## 2021-11-17 MED ORDER — HEPARIN SODIUM (PORCINE) 1000 UNIT/ML IJ SOLN
INTRAMUSCULAR | Status: AC
  Filled 2021-11-17: qty 10

## 2021-11-17 MED ORDER — PROPOFOL 10 MG/ML IV BOLUS
INTRAVENOUS | Status: DC | PRN
  Administered 2021-11-17: 150 mg via INTRAVENOUS

## 2021-11-17 MED ORDER — PROPOFOL 10 MG/ML IV BOLUS
INTRAVENOUS | Status: AC
  Filled 2021-11-17: qty 20

## 2021-11-17 MED ORDER — SUCCINYLCHOLINE CHLORIDE 200 MG/10ML IV SOSY
PREFILLED_SYRINGE | INTRAVENOUS | Status: DC | PRN
  Administered 2021-11-17: 100 mg via INTRAVENOUS

## 2021-11-17 SURGICAL SUPPLY — 71 items
ARMBAND PINK RESTRICT EXTREMIT (MISCELLANEOUS) ×3 IMPLANT
BAG COUNTER SPONGE SURGICOUNT (BAG) ×2 IMPLANT
BAG DECANTER FOR FLEXI CONT (MISCELLANEOUS) ×3 IMPLANT
BENZOIN TINCTURE PRP APPL 2/3 (GAUZE/BANDAGES/DRESSINGS) ×2 IMPLANT
BIOPATCH BLUE 3/4IN DISK W/1.5 (GAUZE/BANDAGES/DRESSINGS) ×1 IMPLANT
BIOPATCH RED 1 DISK 7.0 (GAUZE/BANDAGES/DRESSINGS) ×3 IMPLANT
CANISTER SUCT 3000ML PPV (MISCELLANEOUS) ×3 IMPLANT
CANNULA VESSEL 3MM 2 BLNT TIP (CANNULA) ×3 IMPLANT
CATH PALINDROME-P 19CM W/VT (CATHETERS) ×1 IMPLANT
CATH PALINDROME-P 23CM W/VT (CATHETERS) IMPLANT
CATH PALINDROME-P 28CM W/VT (CATHETERS) IMPLANT
CHLORAPREP W/TINT 26 (MISCELLANEOUS) ×3 IMPLANT
CLIP LIGATING EXTRA MED SLVR (CLIP) ×1 IMPLANT
CLIP LIGATING EXTRA SM BLUE (MISCELLANEOUS) ×1 IMPLANT
COVER PROBE W GEL 5X96 (DRAPES) ×3 IMPLANT
COVER SURGICAL LIGHT HANDLE (MISCELLANEOUS) ×3 IMPLANT
DERMABOND ADVANCED (GAUZE/BANDAGES/DRESSINGS) ×2
DERMABOND ADVANCED .7 DNX12 (GAUZE/BANDAGES/DRESSINGS) IMPLANT
DRAPE C-ARM 42X72 X-RAY (DRAPES) ×3 IMPLANT
DRAPE CHEST BREAST 15X10 FENES (DRAPES) ×3 IMPLANT
DRSG COVADERM 4X6 (GAUZE/BANDAGES/DRESSINGS) ×1 IMPLANT
DURAPREP 26ML APPLICATOR (WOUND CARE) ×1 IMPLANT
ELECT REM PT RETURN 9FT ADLT (ELECTROSURGICAL) ×3
ELECTRODE REM PT RTRN 9FT ADLT (ELECTROSURGICAL) ×2 IMPLANT
GAUZE 4X4 16PLY ~~LOC~~+RFID DBL (SPONGE) ×3 IMPLANT
GAUZE SPONGE 4X4 12PLY STRL (GAUZE/BANDAGES/DRESSINGS) ×2 IMPLANT
GLOVE SURG LTX SZ6.5 (GLOVE) ×3 IMPLANT
GLOVE SURG POLYISO LF SZ8 (GLOVE) ×4 IMPLANT
GLOVE SURG PR MICRO ENCORE 7 (GLOVE) ×1 IMPLANT
GLOVE SURG UNDER POLY LF SZ9 (GLOVE) ×1 IMPLANT
GOWN STRL REUS W/ TWL LRG LVL3 (GOWN DISPOSABLE) ×4 IMPLANT
GOWN STRL REUS W/ TWL XL LVL3 (GOWN DISPOSABLE) ×2 IMPLANT
GOWN STRL REUS W/TWL LRG LVL3 (GOWN DISPOSABLE) ×2
GOWN STRL REUS W/TWL XL LVL3 (GOWN DISPOSABLE) ×1
GUIDEWIRE ANGLED .035X150CM (WIRE) ×2 IMPLANT
INSERT FOGARTY SM (MISCELLANEOUS) IMPLANT
KIT BASIN OR (CUSTOM PROCEDURE TRAY) ×3 IMPLANT
KIT PALINDROME-P 55CM (CATHETERS) IMPLANT
KIT TURNOVER KIT B (KITS) ×3 IMPLANT
NDL 18GX1X1/2 (RX/OR ONLY) (NEEDLE) ×2 IMPLANT
NDL HYPO 25GX1X1/2 BEV (NEEDLE) ×2 IMPLANT
NEEDLE 18GX1X1/2 (RX/OR ONLY) (NEEDLE) IMPLANT
NEEDLE HYPO 25GX1X1/2 BEV (NEEDLE) ×3 IMPLANT
NS IRRIG 1000ML POUR BTL (IV SOLUTION) ×3 IMPLANT
PACK CV ACCESS (CUSTOM PROCEDURE TRAY) ×3 IMPLANT
PACK SURGICAL SETUP 50X90 (CUSTOM PROCEDURE TRAY) ×3 IMPLANT
PAD ARMBOARD 7.5X6 YLW CONV (MISCELLANEOUS) ×6 IMPLANT
SET MICROPUNCTURE 5F STIFF (MISCELLANEOUS) ×3 IMPLANT
SLING ARM FOAM STRAP LRG (SOFTGOODS) IMPLANT
SLING ARM FOAM STRAP MED (SOFTGOODS) IMPLANT
SOAP 2 % CHG 4 OZ (WOUND CARE) ×3 IMPLANT
SPONGE T-LAP 18X18 ~~LOC~~+RFID (SPONGE) ×1 IMPLANT
STRIP CLOSURE SKIN 1/2X4 (GAUZE/BANDAGES/DRESSINGS) ×2 IMPLANT
SUT ETHILON 3 0 PS 1 (SUTURE) ×3 IMPLANT
SUT MNCRL AB 4-0 PS2 18 (SUTURE) ×4 IMPLANT
SUT PROLENE 5 0 C 1 24 (SUTURE) ×1 IMPLANT
SUT PROLENE 5 0 C 1 36 (SUTURE) ×2 IMPLANT
SUT PROLENE 6 0 BV (SUTURE) ×4 IMPLANT
SUT VIC AB 3-0 SH 27 (SUTURE) ×2
SUT VIC AB 3-0 SH 27X BRD (SUTURE) ×2 IMPLANT
SYR 10ML LL (SYRINGE) ×3 IMPLANT
SYR 20ML LL LF (SYRINGE) ×4 IMPLANT
SYR 3ML LL SCALE MARK (SYRINGE) ×3 IMPLANT
SYR 5ML LL (SYRINGE) ×2 IMPLANT
SYR CONTROL 10ML LL (SYRINGE) ×2 IMPLANT
TOWEL GREEN STERILE (TOWEL DISPOSABLE) ×3 IMPLANT
TOWEL GREEN STERILE FF (TOWEL DISPOSABLE) ×6 IMPLANT
UNDERPAD 30X36 HEAVY ABSORB (UNDERPADS AND DIAPERS) ×3 IMPLANT
WATER STERILE IRR 1000ML POUR (IV SOLUTION) ×3 IMPLANT
WIRE AMPLATZ SS-J .035X180CM (WIRE) ×2 IMPLANT
WIRE MICRO SET SILHO 5FR 7 (SHEATH) ×1 IMPLANT

## 2021-11-17 NOTE — Discharge Instructions (Signed)
° °  [facility] [place] ° °Discharge Instructions ° °AV Fistula or Graft Surgery for Dialysis Access ° °Please refer to the following instructions for your post-procedure care. Your surgeon or physician assistant will discuss any changes with you. ° °Activity ° °You may drive the day following your surgery, if you are comfortable and no longer taking prescription pain medication. Resume full activity as the soreness in your incision resolves. ° °Bathing/Showering ° °You may shower after you go home. Keep your incision dry for 48 hours. Do not soak in a bathtub, hot tub, or swim until the incision heals completely. You may not shower if you have a hemodialysis catheter. ° °Incision Care ° °Clean your incision with mild soap and water after 48 hours. Pat the area dry with a clean towel. You do not need a bandage unless otherwise instructed. Do not apply any ointments or creams to your incision. You may have skin glue on your incision. Do not peel it off. It will come off on its own in about one week. Your arm may swell a bit after surgery. To reduce swelling use pillows to elevate your arm so it is above your heart. Your doctor will tell you if you need to lightly wrap your arm with an ACE bandage. ° °Diet ° °Resume your normal diet. There are not special food restrictions following this procedure. In order to heal from your surgery, it is CRITICAL to get adequate nutrition. Your body requires vitamins, minerals, and protein. Vegetables are the best source of vitamins and minerals. Vegetables also provide the perfect balance of protein. Processed food has little nutritional value, so try to avoid this. ° °Medications ° °Resume taking all of your medications. If your incision is causing pain, you may take over-the counter pain relievers such as acetaminophen (Tylenol). If you were prescribed a stronger pain medication, please be aware these medications can cause nausea and constipation. Prevent  nausea by taking the medication with a snack or meal. Avoid constipation by drinking plenty of fluids and eating foods with high amount of fiber, such as fruits, vegetables, and grains. Do not take Tylenol if you are taking prescription pain medications. ° ° ° ° °Follow up °Your surgeon may want to see you in the office following your access surgery. If so, this will be arranged at the time of your surgery. ° °Please call us immediately for any of the following conditions: ° °Increased pain, redness, drainage (pus) from your incision site °Fever of 101 degrees or higher °Severe or worsening pain at your incision site °Hand pain or numbness. ° °Reduce your risk of vascular disease: ° °Stop smoking. If you would like help, call [facility] at 1-800-QUIT-NOW (1-800-784-8669) or [place] at 336-586-4000 ° °Manage your cholesterol °Maintain a desired weight °Control your diabetes °Keep your blood pressure down ° °Dialysis ° °It will take several weeks to several months for your new dialysis access to be ready for use. Your surgeon will determine when it is OK to use it. Your nephrologist will continue to direct your dialysis. You can continue to use your Permcath until your new access is ready for use. ° °If you have any questions, please call the office at [phone]. ° °

## 2021-11-17 NOTE — Transfer of Care (Signed)
Immediate Anesthesia Transfer of Care Note ? ?Patient: Shawna Lowe ? ?Procedure(s) Performed: LEFT UPPER EXTREMITY ARTERIOVENOUS FISTULA REVISION (Left: Arm Upper) ?INSERTION OF TUNNELED DIALYSIS CATHETER ? ?Patient Location: PACU ? ?Anesthesia Type:General ? ?Level of Consciousness: drowsy, patient cooperative and responds to stimulation ? ?Airway & Oxygen Therapy: Patient Spontanous Breathing and Patient connected to face mask oxygen ? ?Post-op Assessment: Report given to RN, Post -op Vital signs reviewed and stable and Patient moving all extremities X 4 ? ?Post vital signs: Reviewed and stable ? ?Last Vitals:  ?Vitals Value Taken Time  ?BP    ?Temp    ?Pulse    ?Resp    ?SpO2    ? ? ?Last Pain:  ?Vitals:  ? 11/17/21 0602  ?TempSrc: Oral  ?PainSc:   ?   ? ?  ? ?Complications: No notable events documented. ?

## 2021-11-17 NOTE — Op Note (Signed)
DATE OF SERVICE: 11/17/2021 ? ?PATIENT:  Shawna Lowe  71 y.o. female ? ?PRE-OPERATIVE DIAGNOSIS:  ESRD; ulcerated left arm arteriovenous fistula ? ?POST-OPERATIVE DIAGNOSIS:  Same ? ?PROCEDURE:   ?1) Right internal jugular tunneled dialysis catheter (19cm) ?2) Left arm arteriovenous fistula revision ?3) Left arm complex wound closure (12x4x1cm) ? ?SURGEON:  Surgeon(s) and Role: ?   * Cherre Robins, MD - Primary ? ?ASSISTANT: Arlee Muslim, PA-C ? ?An experienced assistant was required given the complexity of this procedure and the standard of surgical care. My assistant helped with exposure through counter tension, suctioning, ligation and retraction to better visualize the surgical field.  My assistant expedited sewing during the case by following my sutures. Wherever I use the term "we" in the report, my assistant actively helped me with that portion of the procedure. ? ?ANESTHESIA:   general ? ?EBL: 151mL ? ?BLOOD ADMINISTERED:none ? ?DRAINS: none  ? ?LOCAL MEDICATIONS USED:  NONE ? ?SPECIMEN:  none ? ?COUNTS: confirmed correct. ? ?TOURNIQUET:  none ? ?PATIENT DISPOSITION:  PACU - hemodynamically stable. ?  ?Delay start of Pharmacological VTE agent (>24hrs) due to surgical blood loss or risk of bleeding: no ? ?INDICATION FOR PROCEDURE: Shawna Lowe is a 71 y.o. female with ESRD and an ulcerated left arm arteriovenous fistula. After careful discussion of risks, benefits, and alternatives the patient was offered left arm fistula revision and tunneled dialysis catheter. The patient understood and wished to proceed. ? ?OPERATIVE FINDINGS: Patient had hypoxemic respiratory failure despite laryngeal mask airway requiring urgent conversion to endotracheal tube and contamination of tunneled dialysis catheter mid-placement.  Wires were placed into the inferior vena cava through the catheter and the catheter exchanged for sterile catheter.  This was otherwise unremarkable.  Left arm fistula revision created  deficit approximately 12 x 4 x 1 cm in the left arm which required complex wound closure.  The fistula was plicated.  Palpable thrill at completion. ? ?DESCRIPTION OF PROCEDURE: After identification of the patient in the pre-operative holding area, the patient was transferred to the operating room. The patient was positioned supine on the operating room table. Anesthesia was induced. The neck, chest, and left arm were prepped and draped in standard fashion. A surgical pause was performed confirming correct patient, procedure, and operative location. ? ?Using ultrasound guidance the right internal jugular vein was accessed with micropuncture technique.  Through the micropuncture sheath a floppy J-wire was advanced into the superior vena cava.  A small incision was made around the skin access point.  The access point was serially dilated under direct fluoroscopic guidance.  A peel-away sheath was introduced into the superior vena cava under fluoroscopic guidance.  A counterincision was made in the chest under the clavicle.  A 19 ? cm tunnel dialysis catheter was then tunneled under the skin, over the clavicle into the incision in the neck.  I began to thread the catheter into the central veins. ? ?At this point in the case, the patient started to have hypoxemia.  She developed bradycardia.  The anesthesia team urgently converted her to laryngeal mask airway to a endotracheal tube.  She had good response to this.  The anesthesia team was suspicious she had bronchospasm.  Fluoroscopy confirmed no pneumothorax.  During the intubation, the catheter was contaminated.  I elected to remove this catheter and exchange it for a sterile catheter.   ? ?I introduced an Amplatz wire and a Glidewire into the 2 ports of the tunneled dialysis catheter.  These advanced  easily into the inferior vena cava.  The contaminated catheter was removed over the wires.  I externalized the Glidewire.  I reintroduced a peel-away sheath was  introduced into the superior vena cava under fluoroscopic guidance.  A counterincision was made in the chest under the clavicle.  A 19 cm tunnel dialysis catheter was then tunneled under the skin, over the clavicle into the incision in the neck.  The tunneling device was removed and the catheter fed through the peel-away sheath into the superior vena cava.  The peel-away sheath was removed and the catheter gently pulled back.  Adequate position was confirmed with x-ray.  The catheter was tested and found to flush and draw back well.  Catheter was heparin locked.  Caps were applied.  Catheter was sutured to the skin.  The neck incision was closed with 4-0 Monocryl. ?Adequate position was confirmed with x-ray.  The catheter was tested and found to flush and draw back well.  Catheter was heparin locked.  Caps were applied.  Catheter was sutured to the skin.  The neck incision was closed with 4-0 Monocryl. ? ?An ellipse incision was made over the ulcerated portion of the left upper extremity brachiocephalic arteriovenous fistula.  The ellipse measured 12 x 4 x 1 cm.  The incision was carried down to the fistula.  The fistula was skeletonized.  The patient was systemically heparinized.  Clamps were applied to the proximal and distal fistula.  The anterior aspect of the fistula was removed sharply with Mayo scissors.  The skin was removed.  The open fistula was then closed in 2 layers using 5-0 Prolene.  After completion, the clamps were released.  Several repair sutures were needed to achieve hemostasis.  After completion, good thrill was noted in the fistula.  Complex wound closure was required to close a large defect.  This was achieved using tissue rearrangement and opted 3-0 Vicryl, and 4-0 Monocryl.  Dermabond was applied to the incision. ? ?Upon completion of the case instrument and sharps counts were confirmed correct. The patient was transferred to the PACU in good condition. I was present for all portions of the  procedure. ? ?Yevonne Aline. Stanford Breed, MD ?[facility] ?Office Phone Number: [phone] ?11/17/2021 10:42 AM ? ? ? ?

## 2021-11-17 NOTE — Anesthesia Postprocedure Evaluation (Signed)
Anesthesia Post Note ? ?Patient: Caylen Kuwahara ? ?Procedure(s) Performed: LEFT UPPER EXTREMITY ARTERIOVENOUS FISTULA REVISION (Left: Arm Upper) ?INSERTION OF TUNNELED DIALYSIS CATHETER ? ?  ? ?Patient location during evaluation: PACU ?Anesthesia Type: General ?Level of consciousness: awake and alert ?Pain management: pain level controlled ?Vital Signs Assessment: post-procedure vital signs reviewed and stable ?Respiratory status: spontaneous breathing, nonlabored ventilation, respiratory function stable and patient connected to nasal cannula oxygen ?Cardiovascular status: blood pressure returned to baseline and stable ?Postop Assessment: no apparent nausea or vomiting ?Anesthetic complications: no ? ? ?No notable events documented. ? ?Last Vitals:  ?Vitals:  ? 11/17/21 1105 11/17/21 1120  ?BP: 120/69 131/64  ?Pulse: 67 65  ?Resp: 17 (!) 21  ?Temp:  (!) 36.1 ?C  ?SpO2: 91% 91%  ?  ?Last Pain:  ?Vitals:  ? 11/17/21 1120  ?TempSrc:   ?PainSc: 0-No pain  ? ? ?  ?  ?  ?  ?  ?  ? ?Privateer S ? ? ? ? ?

## 2021-11-17 NOTE — Progress Notes (Signed)
Called PACU to make them aware that daughter, Shawna Lowe, is interpreting and has signed the "interpreter sheet". She will need to come back to PACU when patient is out of surgery.  ?

## 2021-11-17 NOTE — Anesthesia Procedure Notes (Signed)
Procedure Name: Intubation ?Date/Time: 11/17/2021 8:24 AM ?Performed by: Michele Rockers, CRNA ?Pre-anesthesia Checklist: Patient identified, Patient being monitored, Timeout performed, Emergency Drugs available and Suction available ?Patient Re-evaluated:Patient Re-evaluated prior to induction ?Oxygen Delivery Method: Circle system utilized ?Preoxygenation: Pre-oxygenation with 100% oxygen ?Induction Type: IV induction ?Ventilation: Mask ventilation without difficulty ?Laryngoscope Size: Mac and 3 ?Grade View: Grade I ?Tube type: Oral ?Tube size: 7.5 mm ?Number of attempts: 1 ?Airway Equipment and Method: Stylet ?Placement Confirmation: ETT inserted through vocal cords under direct vision, positive ETCO2 and breath sounds checked- equal and bilateral ?Secured at: 21 cm ?Tube secured with: Tape ?Dental Injury: Teeth and Oropharynx as per pre-operative assessment  ? ? ? ? ?

## 2021-11-17 NOTE — Progress Notes (Signed)
Dr. Stanford Breed at bedside earlier to eval incision new dressing applied by MD daughter at bedside with MD patient alert talkative skin warm and dry respirations even unlabored  ?

## 2021-11-17 NOTE — Interval H&P Note (Signed)
History and Physical Interval Note: ? ?11/17/2021 ?7:24 AM ? ?Shawna Lowe  has presented today for surgery, with the diagnosis of ESRD.  The various methods of treatment have been discussed with the patient and family. After consideration of risks, benefits and other options for treatment, the patient has consented to  Procedure(s) with comments: ?LEFT UPPER EXTREMITY ARTERIOVENOUS FISTULA REVISION (Left) - PERIPHERAL NERVE BLOCK ?INSERTION OF TUNNELED DIALYSIS CATHETER (N/A) as a surgical intervention.  The patient's history has been reviewed, patient examined, no change in status, stable for surgery.  I have reviewed the patient's chart and labs.  Questions were answered to the patient's satisfaction.   ? ? ?Cherre Robins ? ? ?

## 2021-11-17 NOTE — Anesthesia Procedure Notes (Addendum)
Procedure Name: LMA Insertion ?Date/Time: 11/17/2021 7:38 AM ?Performed by: Michele Rockers, CRNA ?Pre-anesthesia Checklist: Patient identified, Emergency Drugs available, Suction available, Timeout performed and Patient being monitored ?Patient Re-evaluated:Patient Re-evaluated prior to induction ?Oxygen Delivery Method: Circle system utilized ?Preoxygenation: Pre-oxygenation with 100% oxygen ?Induction Type: IV induction ?Ventilation: Mask ventilation without difficulty ?LMA Size: 4.0 ?Number of attempts: 1 ?Placement Confirmation: breath sounds checked- equal and bilateral and positive ETCO2 ?Tube secured with: Tape ? ? ? ? ?

## 2021-11-17 NOTE — Anesthesia Preprocedure Evaluation (Signed)
Anesthesia Evaluation  ?Patient identified by MRN, date of birth, ID band ?Patient awake ? ? ? ?Reviewed: ?Allergy & Precautions, H&P , NPO status , Patient's Chart, lab work & pertinent test results ? ?Airway ?Mallampati: II ? ? ?Neck ROM: full ? ? ? Dental ?  ?Pulmonary ?asthma ,  ?  ?breath sounds clear to auscultation ? ? ? ? ? ? Cardiovascular ?hypertension, + CABG  ? ?Rhythm:regular Rate:Normal ? ? ?  ?Neuro/Psych ?CVA   ? GI/Hepatic ?  ?Endo/Other  ?diabetes, Type 2 ? Renal/GU ?ESRF and DialysisRenal disease  ? ?  ?Musculoskeletal ? ? Abdominal ?  ?Peds ? Hematology ?  ?Anesthesia Other Findings ? ? Reproductive/Obstetrics ? ?  ? ? ? ? ? ? ? ? ? ? ? ? ? ?  ?  ? ? ? ? ? ? ? ? ?Anesthesia Physical ?Anesthesia Plan ? ?ASA: 3 ? ?Anesthesia Plan: MAC and Regional  ? ?Post-op Pain Management:   ? ?Induction: Intravenous ? ?PONV Risk Score and Plan: 2 and Ondansetron, Propofol infusion and Treatment may vary due to age or medical condition ? ?Airway Management Planned: Simple Face Mask ? ?Additional Equipment:  ? ?Intra-op Plan:  ? ?Post-operative Plan: Extubation in OR ? ?Informed Consent: I have reviewed the patients History and Physical, chart, labs and discussed the procedure including the risks, benefits and alternatives for the proposed anesthesia with the patient or authorized representative who has indicated his/her understanding and acceptance.  ? ? ? ?Dental advisory given ? ?Plan Discussed with: CRNA, Anesthesiologist and Surgeon ? ?Anesthesia Plan Comments:   ? ? ? ? ? ? ?Anesthesia Quick Evaluation ? ?

## 2021-11-18 ENCOUNTER — Encounter (HOSPITAL_COMMUNITY): Payer: Self-pay | Admitting: Vascular Surgery

## 2021-12-22 ENCOUNTER — Other Ambulatory Visit: Payer: Self-pay

## 2021-12-22 DIAGNOSIS — N186 End stage renal disease: Secondary | ICD-10-CM

## 2021-12-23 NOTE — Progress Notes (Signed)
? ? ?  Postoperative Access Visit ? ? ?History of Present Illness  ? ?Shawna Lowe is a 71 y.o. year old female who presents for postoperative follow-up for: 1) Right internal jugular tunneled dialysis catheter (19cm), 2) Left arm arteriovenous fistula revision, 3) Left arm complex wound closure (12x4x1cm) on 11/17/21 by Dr. Stanford Breed.  This was performed due to ulceration overlying the AV fistula. The patient's wounds are almost completely healed.  The patient notes no steal symptoms. No bleeding. ? ?She currently dialyzes on MWF via right IJ TDC at [facility]. Her Nephrologist is Dr. Moshe Cipro. ? ?Physical Examination  ? ?Vitals:  ? 12/27/21 1028  ?BP: (!) 193/71  ?Pulse: 69  ?Resp: 18  ?Temp: (!) 97.5 ?F (36.4 ?C)  ?TempSrc: Temporal  ?SpO2: 98%  ?Weight: 166 lb 3.2 oz (75.4 kg)  ?Height: 5\' 1"  (1.549 m)  ? ?Body mass index is 31.4 kg/m?. ? ?left arm Incision is almost healed. There is a very small superficial scab in the mid-proximal incision, 2+ radial pulse, hand grip is 5/5, sensation in digits is intact, palpable thrill, bruit can be auscultated   ? ?Non invasive Vascular lab: ?Summary: Patent left brachial-cephalic AV fistula with an area of narrowed diameter in the mid-distal upper arm with increased velocities.  ? ?Medical Decision Making  ? ?Shawna Lowe is a 71 y.o. year old female who presents s/p 1) Right internal jugular tunneled dialysis catheter (19cm), 2) Left arm arteriovenous fistula revision, 3) Left arm complex wound closure (12x4x1cm) on 11/17/21 by Dr. Stanford Breed.  This was performed due to ulceration overlying the AV fistula. Her incision is almost completely healed. There is small scab still present in mid - proximal fistula. Fistula otherwise has great thrill ?- Her duplex today shows patent left BC AV fistula. Adequate volume flow , diameter and depth. There is some elevated velocities in the distal UA but clinically well appearing. ?Patent is without signs or symptoms of steal  syndrome ?The patient's access will be ready for use as of 01/10/22, once incision has completely healed ?The patient's tunneled dialysis catheter can be removed when Nephrology is comfortable with the performance of the left BC AV fistula. Please contact our office to arrange removal.  ?The patient may follow up on a prn basis ? ? ?Karoline Caldwell, PA-C ?[facility] ?Office: [phone] ? ?Clinic MD: Roxanne Mins ?

## 2021-12-27 ENCOUNTER — Ambulatory Visit (HOSPITAL_COMMUNITY)
Admission: RE | Admit: 2021-12-27 | Discharge: 2021-12-27 | Disposition: A | Discharge status: Home / Self Care | Payer: Medicare HMO | Source: Ambulatory Visit | Attending: Vascular Surgery | Admitting: Vascular Surgery

## 2021-12-27 ENCOUNTER — Ambulatory Visit (INDEPENDENT_AMBULATORY_CARE_PROVIDER_SITE_OTHER): Payer: Medicare HMO | Admitting: Physician Assistant

## 2021-12-27 VITALS — BP 193/71 | HR 69 | Temp 97.5°F | Resp 18 | Ht 61.0 in | Wt 166.2 lb

## 2021-12-27 DIAGNOSIS — N186 End stage renal disease: Secondary | ICD-10-CM

## 2021-12-27 DIAGNOSIS — Z992 Dependence on renal dialysis: Secondary | ICD-10-CM | POA: Diagnosis present

## 2022-07-06 IMAGING — CT CT MAXILLOFACIAL W/O CM
1 series · 8 of 30 positions shown, 10 images · non-contrast
Comparison: No pertinent prior exams available for comparison.

CLINICAL DATA: Chronic cough. Cough. Cough, persistent. Additional
history provided by scanning technologist: Patient reports chronic
cough, symptoms for 3 years.

EXAM:
CT MAXILLOFACIAL WITHOUT CONTRAST
TECHNIQUE: Multidetector CT images of the paranasal sinuses were obtained using
the standard protocol without intravenous contrast.

[Series 8: sag soft · sagittal · 0.21mm/px · 8 of 110 slices shown, 10 images]
[im 8/110  brain]
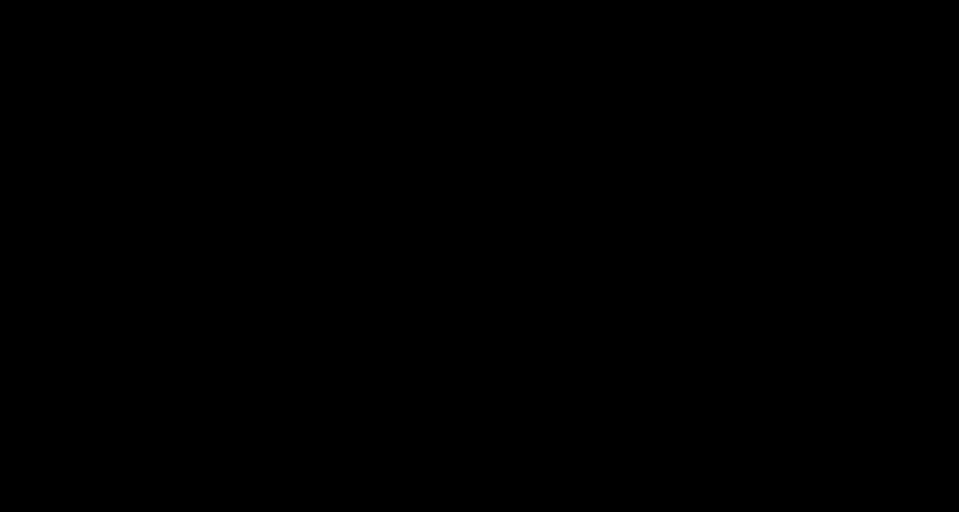
[im 8/110  bone]
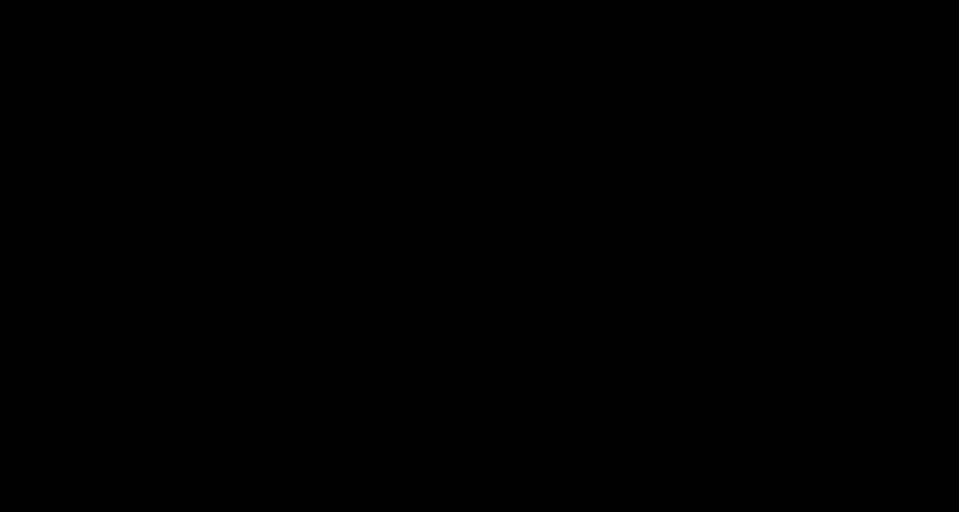
[im 23/110  bone]
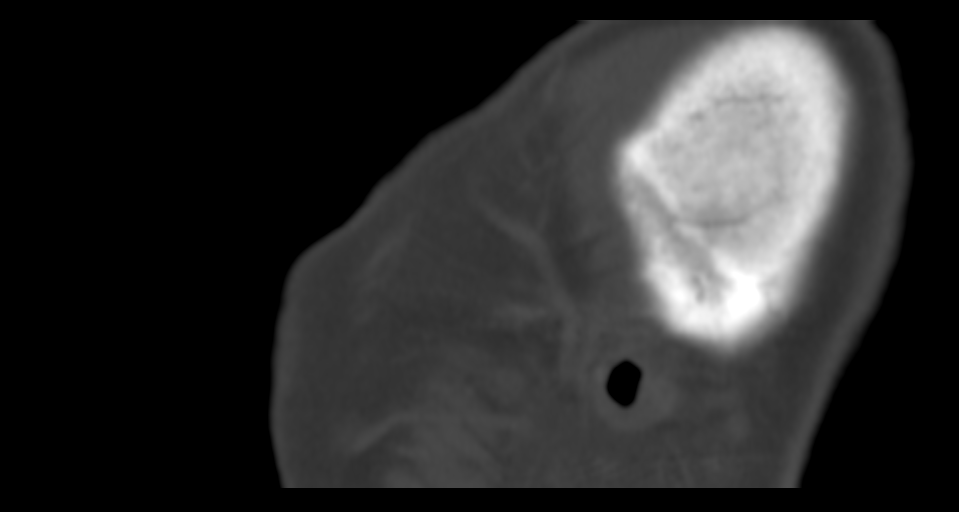
[im 34/110  bone]
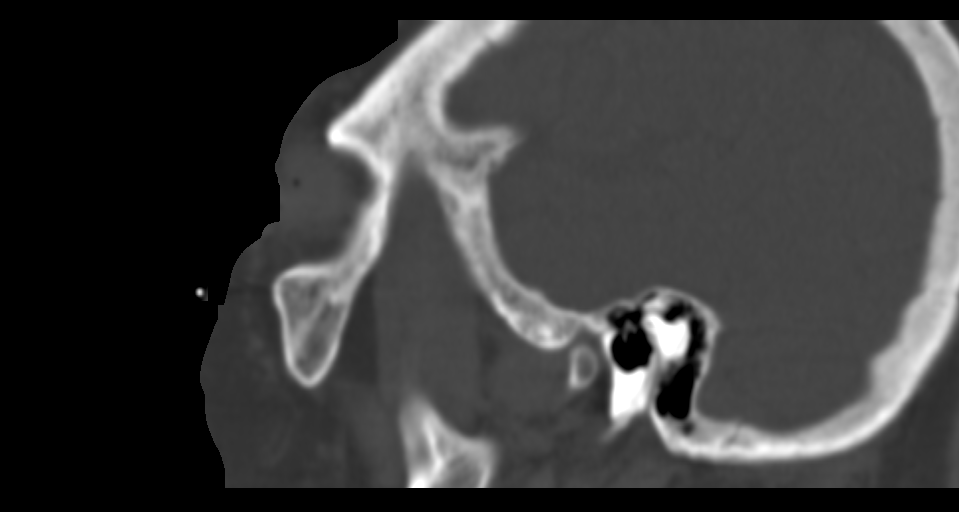
[im 49/110  bone]
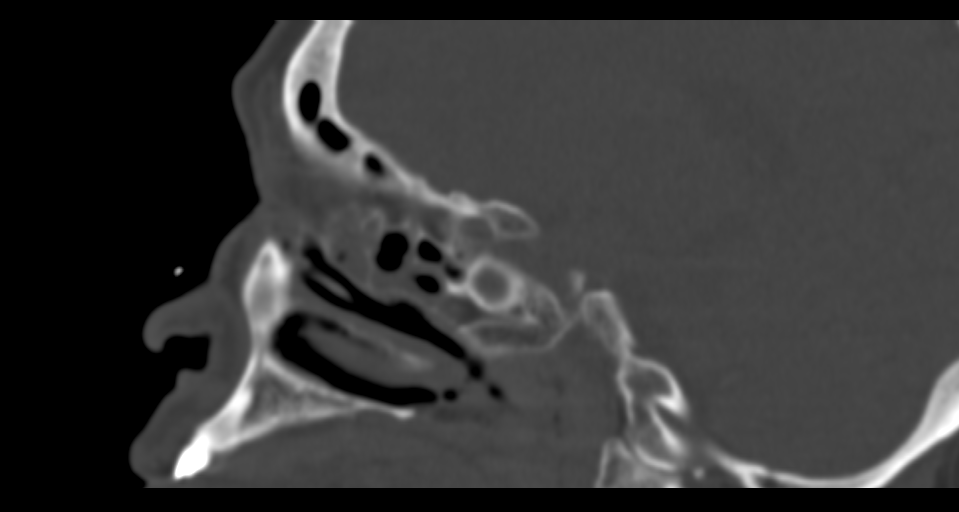
[im 61/110  brain]
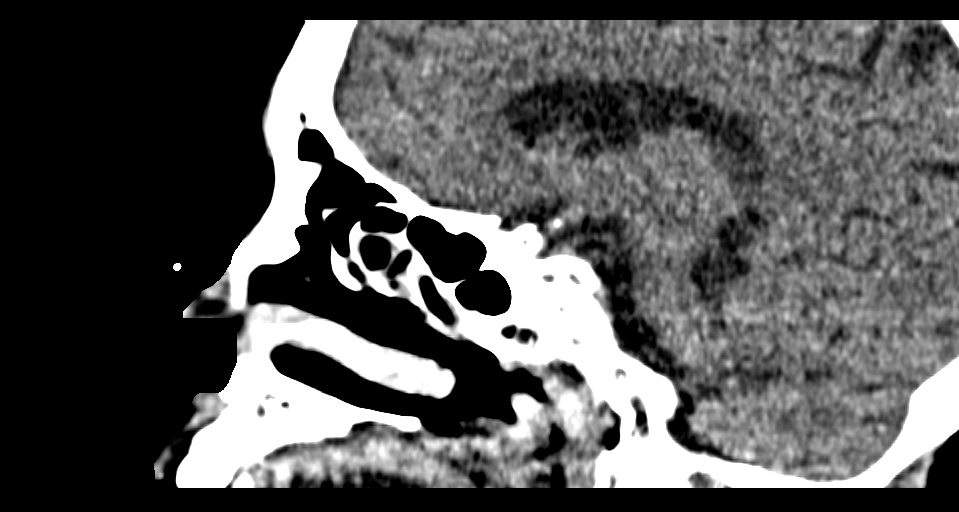
[im 61/110  bone]
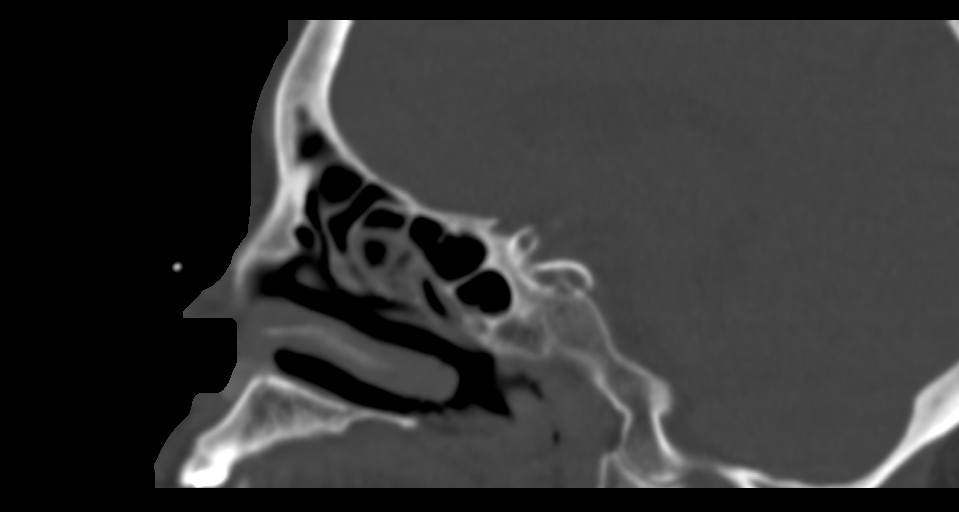
[im 76/110  bone]
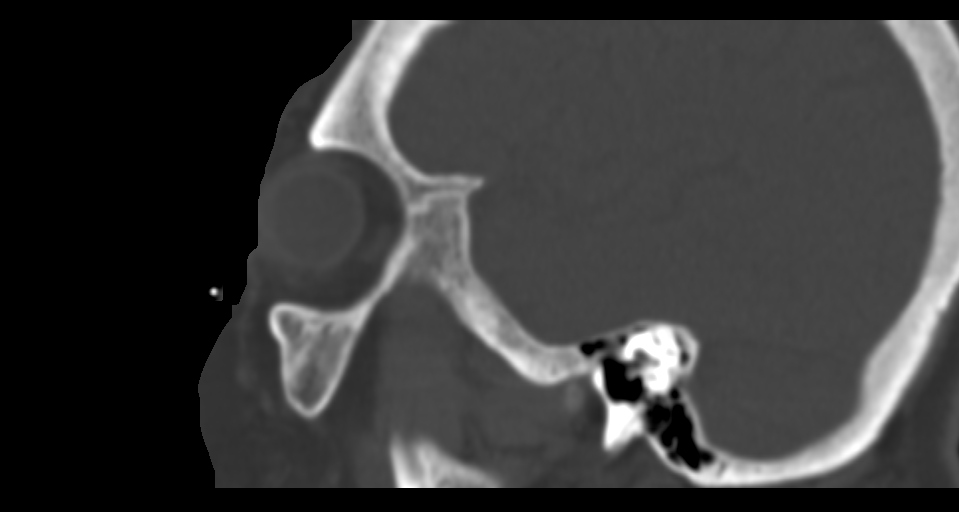
[im 87/110  bone]
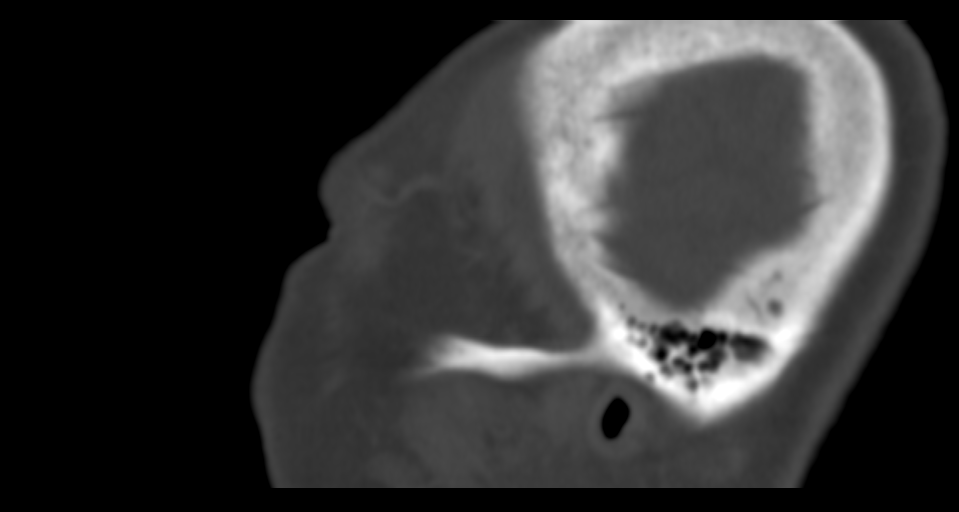
[im 102/110  bone]
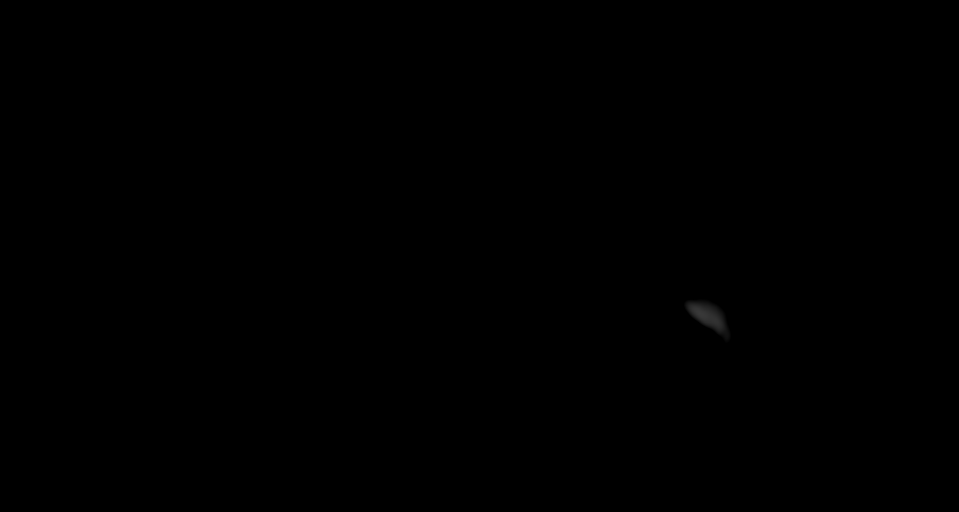

[8 of 30 positions shown; findings below may reference images not displayed]

FINDINGS: Paranasal sinuses:

Frontal: Trace mucosal thickening within the frontal sinuses
bilaterally. Patent frontal sinus drainage pathways.

Ethmoid: Frothy secretions and moderate to moderately severe mucosal
thickening within the bilateral ethmoid air cells.

Maxillary: Frothy secretions and moderate/severe mucosal thickening
within the left maxillary sinus. Frothy secretions and mild/moderate
mucosal thickening within the right maxillary sinus.

Sphenoid: Severe mucosal thickening and possible fluid within the
right sphenoid sinus. Mucosal thickening obstructs the right
maxillary sinus ostium and portions of the right sphenoethmoidal
recess. Mild mucosal thickening and small volume secretions within
the left sphenoid sinus. The left sphenoethmoidal recess is patent.

Right ostiomeatal unit: Mucosal thickening slightly narrows the
maxillary sinus ostium. Otherwise patent.

Left ostiomeatal unit: Mucosal thickening slightly narrows the
maxillary sinus ostium. Otherwise patent.

Nasal passages: The nasal septum is essentially midline. Trace
mucosal thickening within the bilateral nasal passages.
Additionally, there is a lobular hyperdense subcutaneous cyst or
nonspecific lesion along the inferolateral aspect of the left nasal
aperture (for instance as seen on series 3, image 13) (series 5,
image 19). No appreciable adjacent bony remodeling.

Anatomy: Pneumatization is present superior to the anterior ethmoid
notches. Symmetric and intact olfactory grooves and fovea
ethmoidalis, Keros II (4-7mm). Sellar sphenoid pneumatization
pattern.
IMPRESSION: Extensive pansinusitis, as detailed within the body of the report
and most notably affecting the bilateral ethmoid, sphenoid and
maxillary sinuses. Mucosal thickening partially obstructs the right
sphenoethmoidal recess and slightly narrows the maxillary sinus
ostia bilaterally.

Nonspecific 8 mm lobular hyperdense subcutaneous cyst or lesion
along the inferolateral aspect of the left nasal aperture. Direct
visualization is recommended.

Trace mucosal thickening within the bilateral nasal passages.

## 2022-07-11 ENCOUNTER — Encounter: Payer: Self-pay | Admitting: *Deleted

## 2022-07-11 ENCOUNTER — Encounter: Payer: Self-pay | Admitting: Cardiology

## 2022-07-11 ENCOUNTER — Ambulatory Visit: Payer: Medicare HMO | Attending: Cardiology | Admitting: Cardiology

## 2022-07-11 VITALS — BP 170/70 | HR 100 | Ht 61.0 in | Wt 169.0 lb

## 2022-07-11 DIAGNOSIS — Z992 Dependence on renal dialysis: Secondary | ICD-10-CM

## 2022-07-11 DIAGNOSIS — N186 End stage renal disease: Secondary | ICD-10-CM | POA: Diagnosis not present

## 2022-07-11 DIAGNOSIS — I25709 Atherosclerosis of coronary artery bypass graft(s), unspecified, with unspecified angina pectoris: Secondary | ICD-10-CM

## 2022-07-11 DIAGNOSIS — R0602 Shortness of breath: Secondary | ICD-10-CM | POA: Diagnosis not present

## 2022-07-11 DIAGNOSIS — R002 Palpitations: Secondary | ICD-10-CM

## 2022-07-11 DIAGNOSIS — R0789 Other chest pain: Secondary | ICD-10-CM | POA: Diagnosis not present

## 2022-07-11 NOTE — Progress Notes (Signed)
Cardiology Office Note:    Date:  07/11/2022   ID:  Shawna Lowe, DOB 01-22-1951, MRN [id number]  PCP:  Corliss Parish, MD   [facility] Providers Cardiologist:  Candee Furbish, MD     Referring MD: Janalee Dane, PA*   History of Present Illness:    Shawna Lowe is a 71 y.o. female here for the evaluation of palpitations at dialysis at the request of Shawna Paganini, PA-C.  She has a history of end stage renal disease since 04/2017, hypertension, diabetes mellitus, coronary artery disease (had 4 CABG procedures in 2017), asthma, and a CVA in 2017.  As of 07/07/2022 it is reported she dialyzes in her left upper extremity arteriovenous fistulas which appears well. Her daughter reported that protonix makes her "dizzy".   Today, she is accompanied by her daughter. She reports chest tightness that she described as a "squeezing" in her chest. Her daughter reports she has been feeling well until about a month ago when the squeezing began. She experiencing these episodes during dialysis but also during simple tasks like walking into the home. Her daughter reports that these episodes are becoming more frequent as of late and that sometimes she will have an   She is receiving dialysis on her left side.   She reports she used to chew tobacco but denies having ever smoked.   She  denies any palpitations, chest pain, shortness of breath, or peripheral edema. No lightheadedness, headaches, syncope, orthopnea, or PND.   Past Medical History:  Diagnosis Date   Asthma    Chronic kidney disease    Diabetes mellitus without complication ([place])    Dialysis patient ([place])    Hypertension    Palpitations    Pneumonia    Stroke [facility])     Past Surgical History:  Procedure Laterality Date   AV FISTULA PLACEMENT     avf Left    CORONARY ARTERY BYPASS GRAFT     EYE SURGERY Bilateral    cataract removal   INSERTION OF DIALYSIS CATHETER N/A 11/17/2021   Procedure: INSERTION OF  TUNNELED DIALYSIS CATHETER;  Surgeon: Cherre Robins, MD;  Location: [place];  Service: Vascular;  Laterality: N/A;   REVISON OF ARTERIOVENOUS FISTULA Left 11/17/2021   Procedure: LEFT UPPER EXTREMITY ARTERIOVENOUS FISTULA REVISION;  Surgeon: Cherre Robins, MD;  Location: [place];  Service: Vascular;  Laterality: Left;  PERIPHERAL NERVE BLOCK   TUBAL LIGATION      Current Medications: Current Meds  Medication Sig   acetaminophen (TYLENOL) 500 MG tablet Take 1,000 mg by mouth every 6 (six) hours as needed for moderate pain.   albuterol (PROVENTIL) (2.5 MG/3ML) 0.083% nebulizer solution Take 2.5 mg by nebulization every 6 (six) hours as needed for wheezing or shortness of breath.   albuterol (VENTOLIN HFA) 108 (90 Base) MCG/ACT inhaler Inhale 1-2 puffs into the lungs every 6 (six) hours as needed for wheezing or shortness of breath.   aspirin 325 MG tablet Take 325 mg by mouth daily.   cimetidine (TAGAMET) 200 MG tablet Take 200 mg by mouth daily as needed (acid reflux).   fluticasone (FLONASE) 50 MCG/ACT nasal spray Place 1-2 sprays into both nostrils daily as needed for allergies.   gabapentin (NEURONTIN) 300 MG capsule Take 300 mg by mouth daily.   glipiZIDE (GLUCOTROL) 10 MG tablet Take 10 mg by mouth 2 (two) times daily before a meal.   HYDROcodone-acetaminophen (NORCO) 5-325 MG tablet Take 1 tablet by mouth every 6 (six) hours  as needed for moderate pain.   insulin glargine (LANTUS) 100 UNIT/ML injection Inject 12 Units into the skin at bedtime.   levocetirizine (XYZAL) 5 MG tablet Take 5 mg by mouth daily as needed for allergies.   lidocaine-prilocaine (EMLA) cream Apply 1 application. topically as needed (fistula access).   losartan (COZAAR) 100 MG tablet Take 100 mg by mouth daily.   metoprolol succinate (TOPROL-XL) 50 MG 24 hr tablet Take 50 mg by mouth daily. Take with or immediately following a meal.   montelukast (SINGULAIR) 10 MG tablet Take 10 mg by mouth at bedtime.    pantoprazole (PROTONIX) 40 MG tablet Take 40 mg by mouth daily.   sevelamer carbonate (RENVELA) 800 MG tablet Take 800 mg by mouth 3 (three) times daily with meals.     Allergies:   Mosquito (culex pipiens) allergy skin test, Lisinopril, Penicillins, and Shellfish-derived products   Social History   Socioeconomic History   Marital status: Married    Spouse name: Not on file   Number of children: Not on file   Years of education: Not on file   Highest education level: Not on file  Occupational History   Not on file  Tobacco Use   Smoking status: Never   Smokeless tobacco: Never  Vaping Use   Vaping Use: Never used  Substance and Sexual Activity   Alcohol use: Not Currently   Drug use: Never   Sexual activity: Not on file  Other Topics Concern   Not on file  Social History Narrative   Not on file   Social Determinants of Health   Financial Resource Strain: Not on file  Food Insecurity: Not on file  Transportation Needs: Not on file  Physical Activity: Not on file  Stress: Not on file  Social Connections: Not on file     Family History: The patient's family history is not on file.  ROS:   Please see the history of present illness.    (+) Chest tightness/"Squeezing"  (+) Shortness of breath  (+) Wheezing All other systems reviewed and are negative.  EKGs/Labs/Other Studies Reviewed:    The following studies were reviewed today:  Vas Duplex Dialysis Access 12/27/2021: Summary:  Patent left brachial-cephalic AV fistula with an area of narrowed diameter  in the mid-distal upper arm with increased velocities.    EKG:  EKG is personally reviewed and interpreted. 07/11/2022: Sinus tachycardic. Rate 100 bpm. Non-specific ST changes  Recent Labs: 11/17/2021: BUN 46; Creatinine, Ser 7.00; Hemoglobin 11.2; Potassium 3.8; Sodium 135   Recent Lipid Panel No results found for: "CHOL", "TRIG", "HDL", "CHOLHDL", "VLDL", "LDLCALC", "LDLDIRECT"   Risk  Assessment/Calculations:          Physical Exam:    VS:  BP (!) 170/70 (BP Location: Left Arm, Patient Position: Sitting, Cuff Size: Large)   Pulse 100   Ht 5\' 1"  (1.549 m)   Wt 169 lb (76.7 kg)   SpO2 94%   BMI 31.93 kg/m     Wt Readings from Last 3 Encounters:  07/11/22 169 lb (76.7 kg)  12/27/21 166 lb 3.2 oz (75.4 kg)  11/17/21 168 lb (76.2 kg)     GEN: Well nourished, well developed in no acute distress HEENT: Normal NECK: No JVD; No carotid bruits LYMPHATICS: No lymphadenopathy CARDIAC:  RRR, no murmurs, rubs, gallops RESPIRATORY:  Wheezing. No rales or rhonchi.  ABDOMEN: Soft, non-tender, non-distended MUSCULOSKELETAL:  No edema; No deformity, left upper extremity bruit at fistula. SKIN: Warm and dry NEUROLOGIC:  Alert and oriented x 3 PSYCHIATRIC:  Normal affect  Ambulates with a cane  ASSESSMENT:    1. Coronary artery disease involving coronary bypass graft of native heart with angina pectoris ([place])   2. Shortness of breath   3. Chest pressure   4. ESRD on dialysis ([place])   5. Palpitations    PLAN:    In order of problems listed above: Coronary artery disease - CABG 2017x4  End-stage renal disease - Since 2018.  Left upper arm fistula.  Diabetes with hypertension - Per primary team  Chest pressure/shortness of breath - We will go ahead and check a echocardiogram and nuclear stress test.  Want to ensure proper structure and function of her heart as well as look for any signs of high risk ischemia given her prior coronary disease/CABG.  Hyperkalemia - Has been difficulty with dialysis.  Dietary modifications discussed again.  May be playing a role in some of the palpitations he has been experiencing.  Today's EKG shows normal sinus  Shared Decision Making/Informed Consent The risks [chest pain, shortness of breath, cardiac arrhythmias, dizziness, blood pressure fluctuations, myocardial infarction, stroke/transient ischemic attack, nausea, vomiting,  allergic reaction, radiation exposure, metallic taste sensation and life-threatening complications (estimated to be 1 in 10,000)], benefits (risk stratification, diagnosing coronary artery disease, treatment guidance) and alternatives of a nuclear stress test were discussed in detail with Ms. Kiss and she agrees to proceed.   Follow-up: 6 months with APP.  Medication Adjustments/Labs and Tests Ordered: Current medicines are reviewed at length with the patient today.  Concerns regarding medicines are outlined above.   Orders Placed This Encounter  Procedures   MYOCARDIAL PERFUSION IMAGING   EKG 12-Lead   ECHOCARDIOGRAM COMPLETE   No orders of the defined types were placed in this encounter.  Patient Instructions  Medication Instructions:  The current medical regimen is effective;  continue present plan and medications.  *If you need a refill on your cardiac medications before your next appointment, please call your pharmacy*  Testing/Procedures: Your physician has requested that you have an echocardiogram. Echocardiography is a painless test that uses sound waves to create images of your heart. It provides your doctor with information about the size and shape of your heart and how well your heart's chambers and valves are working. This procedure takes approximately one hour. There are no restrictions for this procedure. Please do NOT wear cologne, perfume, aftershave, or lotions (deodorant is allowed). Please arrive 15 minutes prior to your appointment time.  Your physician has requested that you have a lexiscan myoview. For further information please visit [email or web address]. Please follow instruction sheet, as given.  Follow-Up: At [facility], you and your health needs are our priority.  As part of our continuing mission to provide you with exceptional heart care, we have created designated Provider Care Teams.  These Care Teams include your primary Cardiologist  (physician) and Advanced Practice Providers (APPs -  Physician Assistants and Nurse Practitioners) who all work together to provide you with the care you need, when you need it.  We recommend signing up for the patient portal called "MyChart".  Sign up information is provided on this After Visit Summary.  MyChart is used to connect with patients for Virtual Visits (Telemedicine).  Patients are able to view lab/test results, encounter notes, upcoming appointments, etc.  Non-urgent messages can be sent to your provider as well.   To learn more about what you can do with MyChart, go to [email or web address].  Your next appointment:   6 month(s)  The format for your next appointment:   In Person  Provider:   Nicholes Rough, PA-C, Melina Copa, PA-C, Ambrose Pancoast, NP, Ermalinda Barrios, PA-C, Christen Bame, NP, or Richardson Dopp, PA-C         Important Information About Sugar         I,Rachel Rivera,acting as a scribe for Candee Furbish, MD.,have documented all relevant documentation on the behalf of Candee Furbish, MD,as directed by  Candee Furbish, MD while in the presence of Candee Furbish, MD.  I, Candee Furbish, MD, have reviewed all documentation for this visit. The documentation on 07/11/22 for the exam, diagnosis, procedures, and orders are all accurate and complete.   Signed, Candee Furbish, MD  07/11/2022 11:06 AM    [place]

## 2022-07-11 NOTE — Patient Instructions (Signed)
Medication Instructions:  The current medical regimen is effective;  continue present plan and medications.  *If you need a refill on your cardiac medications before your next appointment, please call your pharmacy*  Testing/Procedures: Your physician has requested that you have an echocardiogram. Echocardiography is a painless test that uses sound waves to create images of your heart. It provides your doctor with information about the size and shape of your heart and how well your heart's chambers and valves are working. This procedure takes approximately one hour. There are no restrictions for this procedure. Please do NOT wear cologne, perfume, aftershave, or lotions (deodorant is allowed). Please arrive 15 minutes prior to your appointment time.  Your physician has requested that you have a lexiscan myoview. For further information please visit [email or web address]. Please follow instruction sheet, as given.  Follow-Up: At [facility], you and your health needs are our priority.  As part of our continuing mission to provide you with exceptional heart care, we have created designated Provider Care Teams.  These Care Teams include your primary Cardiologist (physician) and Advanced Practice Providers (APPs -  Physician Assistants and Nurse Practitioners) who all work together to provide you with the care you need, when you need it.  We recommend signing up for the patient portal called "MyChart".  Sign up information is provided on this After Visit Summary.  MyChart is used to connect with patients for Virtual Visits (Telemedicine).  Patients are able to view lab/test results, encounter notes, upcoming appointments, etc.  Non-urgent messages can be sent to your provider as well.   To learn more about what you can do with MyChart, go to [email or web address].    Your next appointment:   6 month(s)  The format for your next appointment:   In Person  Provider:   Nicholes Rough, PA-C,  Melina Copa, PA-C, Ambrose Pancoast, NP, Ermalinda Barrios, PA-C, Christen Bame, NP, or Richardson Dopp, PA-C         Important Information About Sugar

## 2022-07-26 ENCOUNTER — Telehealth (HOSPITAL_COMMUNITY): Payer: Self-pay | Admitting: *Deleted

## 2022-07-26 NOTE — Telephone Encounter (Signed)
Via interpreter [phone]) Left message on voicemail per DPR in reference to upcoming appointment scheduled on 08/01/2022 at 7:15 with detailed instructions given per Myocardial Perfusion Study Information Sheet for the test. LM to arrive 15 minutes early, and that it is imperative to arrive on time for appointment to keep from having the test rescheduled. If you need to cancel or reschedule your appointment, please call the office within 24 hours of your appointment. Failure to do so may result in a cancellation of your appointment, and a $50 no show fee. Phone number given for call back for any questions.

## 2022-08-01 ENCOUNTER — Ambulatory Visit (HOSPITAL_COMMUNITY): Payer: Medicare HMO | Attending: Cardiovascular Disease

## 2022-08-01 ENCOUNTER — Ambulatory Visit (HOSPITAL_BASED_OUTPATIENT_CLINIC_OR_DEPARTMENT_OTHER): Payer: Medicare HMO

## 2022-08-01 DIAGNOSIS — R0602 Shortness of breath: Secondary | ICD-10-CM | POA: Diagnosis present

## 2022-08-01 DIAGNOSIS — R0789 Other chest pain: Secondary | ICD-10-CM | POA: Diagnosis present

## 2022-08-01 LAB — MYOCARDIAL PERFUSION IMAGING
LV dias vol: 77 mL (ref 46–106)
LV sys vol: 30 mL
Nuc Stress EF: 61 %
Peak HR: 88 {beats}/min
Rest HR: 69 {beats}/min
Rest Nuclear Isotope Dose: 10.5 mCi
SDS: 3
SRS: 0
SSS: 3
ST Depression (mm): 0 mm
Stress Nuclear Isotope Dose: 30.4 mCi
TID: 1

## 2022-08-01 LAB — ECHOCARDIOGRAM COMPLETE
AR max vel: 1.53 cm2
AV Area VTI: 1.73 cm2
AV Mean grad: 8.1 mmHg
AV Peak grad: 17.2 mmHg
Ao pk vel: 2.08 m/s
Area-P 1/2: 3.99 cm2
Height: 61 in
P 1/2 time: 359 msec
S' Lateral: 2.8 cm
Weight: 2704 oz

## 2022-08-01 MED ORDER — REGADENOSON 0.4 MG/5ML IV SOLN
0.4000 mg | Freq: Once | INTRAVENOUS | Status: AC
  Administered 2022-08-01: 0.4 mg via INTRAVENOUS

## 2022-08-01 MED ORDER — PERFLUTREN LIPID MICROSPHERE
1.0000 mL | INTRAVENOUS | Status: AC | PRN
  Administered 2022-08-01: 2 mL via INTRAVENOUS

## 2022-08-01 MED ORDER — TECHNETIUM TC 99M TETROFOSMIN IV KIT
10.5000 | PACK | Freq: Once | INTRAVENOUS | Status: AC | PRN
  Administered 2022-08-01: 10.5 via INTRAVENOUS

## 2022-08-01 MED ORDER — TECHNETIUM TC 99M TETROFOSMIN IV KIT
30.4000 | PACK | Freq: Once | INTRAVENOUS | Status: AC | PRN
  Administered 2022-08-01: 30.4 via INTRAVENOUS

## 2022-08-01 MED ORDER — AMINOPHYLLINE 25 MG/ML IV SOLN
75.0000 mg | Freq: Once | INTRAVENOUS | Status: AC
  Administered 2022-08-01: 75 mg via INTRAVENOUS

## 2022-08-18 ENCOUNTER — Encounter: Payer: Self-pay | Admitting: *Deleted

## 2023-06-18 DIAGNOSIS — E1142 Type 2 diabetes mellitus with diabetic polyneuropathy: Secondary | ICD-10-CM | POA: Insufficient documentation

## 2023-06-19 DIAGNOSIS — L97509 Non-pressure chronic ulcer of other part of unspecified foot with unspecified severity: Secondary | ICD-10-CM | POA: Insufficient documentation

## 2023-06-28 ENCOUNTER — Ambulatory Visit (INDEPENDENT_AMBULATORY_CARE_PROVIDER_SITE_OTHER): Payer: Medicare (Managed Care)

## 2023-06-28 ENCOUNTER — Encounter: Payer: Self-pay | Admitting: Podiatry

## 2023-06-28 ENCOUNTER — Ambulatory Visit (INDEPENDENT_AMBULATORY_CARE_PROVIDER_SITE_OTHER): Payer: Medicare (Managed Care) | Admitting: Podiatry

## 2023-06-28 DIAGNOSIS — J321 Chronic frontal sinusitis: Secondary | ICD-10-CM

## 2023-06-28 DIAGNOSIS — R0989 Other specified symptoms and signs involving the circulatory and respiratory systems: Secondary | ICD-10-CM

## 2023-06-28 DIAGNOSIS — L97521 Non-pressure chronic ulcer of other part of left foot limited to breakdown of skin: Secondary | ICD-10-CM | POA: Diagnosis not present

## 2023-06-28 MED ORDER — DOXYCYCLINE HYCLATE 100 MG PO TABS: 100.0000 mg | ORAL_TABLET | Freq: Two times a day (BID) | ORAL | 0 refills | Status: AC

## 2023-06-28 NOTE — Progress Notes (Signed)
Subjective:   Patient ID: Shawna Lowe, female   DOB: 72 y.o.   MRN: [id number]   HPI Chief Complaint  Patient presents with   Toe Pain    Patient states it started with a little pus and and a blood clot pop , it looks like a blister . PATIENT STATES SHE IS NOT IN NO PAIN     72 year old female presents with her daughter who helps translate for concerns of a blister to her left big toe.  She said originally it was tender but is not now.  No drainage or pus.  No swelling otherwise.  No fevers or chills.  No other concerns.     8.1 A1c  Review of Systems  All other systems reviewed and are negative.  Past Medical History:  Diagnosis Date   Asthma    Chronic kidney disease    Diabetes mellitus without complication ([facility])    Dialysis patient ([facility])    Hypertension    Palpitations    Pneumonia    Stroke [facility])     Past Surgical History:  Procedure Laterality Date   AV FISTULA PLACEMENT     avf Left    CORONARY ARTERY BYPASS GRAFT     EYE SURGERY Bilateral    cataract removal   INSERTION OF DIALYSIS CATHETER N/A 11/17/2021   Procedure: INSERTION OF TUNNELED DIALYSIS CATHETER;  Surgeon: Leonie Douglas, MD;  Location: [facility] OR;  Service: Vascular;  Laterality: N/A;   REVISON OF ARTERIOVENOUS FISTULA Left 11/17/2021   Procedure: LEFT UPPER EXTREMITY ARTERIOVENOUS FISTULA REVISION;  Surgeon: Leonie Douglas, MD;  Location: [facility] OR;  Service: Vascular;  Laterality: Left;  PERIPHERAL NERVE BLOCK   TUBAL LIGATION       Current Outpatient Medications:    acetaminophen (TYLENOL) 500 MG tablet, Take 1,000 mg by mouth every 6 (six) hours as needed for moderate pain., Disp: , Rfl:    albuterol (PROVENTIL) (2.5 MG/3ML) 0.083% nebulizer solution, Take 2.5 mg by nebulization every 6 (six) hours as needed for wheezing or shortness of breath., Disp: , Rfl:    albuterol (VENTOLIN HFA) 108 (90 Base) MCG/ACT inhaler, Inhale 1-2 puffs into the lungs every 6 (six) hours as needed for wheezing or  shortness of breath., Disp: , Rfl:    aspirin 325 MG tablet, Take 325 mg by mouth daily., Disp: , Rfl:    cimetidine (TAGAMET) 200 MG tablet, Take 200 mg by mouth daily as needed (acid reflux)., Disp: , Rfl:    doxycycline (VIBRA-TABS) 100 MG tablet, Take 1 tablet (100 mg total) by mouth 2 (two) times daily., Disp: 14 tablet, Rfl: 0   fluticasone (FLONASE) 50 MCG/ACT nasal spray, Place 1-2 sprays into both nostrils daily as needed for allergies., Disp: , Rfl:    gabapentin (NEURONTIN) 300 MG capsule, Take 300 mg by mouth daily., Disp: , Rfl:    glipiZIDE (GLUCOTROL) 10 MG tablet, Take 10 mg by mouth 2 (two) times daily before a meal., Disp: , Rfl:    HYDROcodone-acetaminophen (NORCO) 5-325 MG tablet, Take 1 tablet by mouth every 6 (six) hours as needed for moderate pain., Disp: 12 tablet, Rfl: 0   insulin glargine (LANTUS) 100 UNIT/ML injection, Inject 12 Units into the skin at bedtime., Disp: , Rfl:    levocetirizine (XYZAL) 5 MG tablet, Take 5 mg by mouth daily as needed for allergies., Disp: , Rfl:    lidocaine-prilocaine (EMLA) cream, Apply 1 application. topically as needed (fistula access)., Disp: , Rfl:  losartan (COZAAR) 100 MG tablet, Take 100 mg by mouth daily., Disp: , Rfl:    metoprolol succinate (TOPROL-XL) 50 MG 24 hr tablet, Take 50 mg by mouth daily. Take with or immediately following a meal., Disp: , Rfl:    montelukast (SINGULAIR) 10 MG tablet, Take 10 mg by mouth at bedtime., Disp: , Rfl:    pantoprazole (PROTONIX) 40 MG tablet, Take 40 mg by mouth daily., Disp: , Rfl:    sevelamer carbonate (RENVELA) 800 MG tablet, Take 800 mg by mouth 3 (three) times daily with meals., Disp: , Rfl:   Allergies  Allergen Reactions   Mosquito (Culex Pipiens) Allergy Skin Test Shortness Of Breath and Rash    And  Trouble  breathing    Lisinopril Hives and Rash    Patient cannot remember the exact nature of the allergy   Penicillins Swelling, Rash and Cough    Swelling,  numbness      Shellfish-Derived Products Hives and Rash          Objective:  Physical Exam  General: AAO x3, NAD  Dermatological: The distal aspect left hallux has dried callus, likely old blister.  Upon debridement there is new, healthy skin underneath this.  There is minimal edema.  No surrounding erythema or any ascending size.  No fluctuation or crepitation.  In order.  No admissions otherwise.  Nails are hypertrophic, dystrophic with yellow discoloration.  No pain.       Vascular: Dorsalis Pedis artery and Posterior Tibial artery pedal pulses are palpable bilateral with immedate capillary fill time. There is no pain with calf compression, swelling, warmth, erythema.   Neruologic: Sensation decreased.  Musculoskeletal: No pain on exam      Assessment:   Dried blister/pre-ulcerative callus left hallux with localized erythema     Plan:  -Treatment options discussed including all alternatives, risks, and complications -Etiology of symptoms were discussed -X-rays obtained reviewed.  3 views of the foot were obtained.  No definitive cortical changes suggest osteomyelitis.  No soft tissue edema. -I have reviewed hyperkeratotic lesion and complications.  Appears to the skin underneath is intact.  No drainage. -Have ordered ABI. -Doxycycline given mild erythema -Antibiotic ointment dressing changes daily. -Offloading -Monitor for any clinical signs or symptoms of infection and directed to call the office immediately should any occur or go to the ER.  Return in about 2 weeks (around 07/12/2023) for ulcer check, nail trim .  Vivi Barrack DPM      Ossible fung med if needed

## 2023-06-28 NOTE — Patient Instructions (Signed)
Monitor for any signs/symptoms of infection. Call the office immediately if any occur or go directly to the emergency room. Call with any questions/concerns.  --  Diabetes Mellitus and Foot Care Diabetes, also called diabetes mellitus, may cause problems with your feet and legs because of poor blood flow (circulation). Poor circulation may make your skin: Become thinner and drier. Break more easily. Heal more slowly. Peel and crack. You may also have nerve damage (neuropathy). This can cause decreased feeling in your legs and feet. This means that you may not notice minor injuries to your feet that could lead to more serious problems. Finding and treating problems early is the best way to prevent future foot problems. How to care for your feet Foot hygiene  Wash your feet daily with warm water and mild soap. Do not use hot water. Then, pat your feet and the areas between your toes until they are fully dry. Do not soak your feet. This can dry your skin. Trim your toenails straight across. Do not dig under them or around the cuticle. File the edges of your nails with an emery board or nail file. Apply a moisturizing lotion or petroleum jelly to the skin on your feet and to dry, brittle toenails. Use lotion that does not contain alcohol and is unscented. Do not apply lotion between your toes. Shoes and socks Wear clean socks or stockings every day. Make sure they are not too tight. Do not wear knee-high stockings. These may decrease blood flow to your legs. Wear shoes that fit well and have enough cushioning. Always look in your shoes before you put them on to be sure there are no objects inside. To break in new shoes, wear them for just a few hours a day. This prevents injuries on your feet. Wounds, scrapes, corns, and calluses  Check your feet daily for blisters, cuts, bruises, sores, and redness. If you cannot see the bottom of your feet, use a mirror or ask someone for help. Do not cut off  corns or calluses or try to remove them with medicine. If you find a minor scrape, cut, or break in the skin on your feet, keep it and the skin around it clean and dry. You may clean these areas with mild soap and water. Do not clean the area with peroxide, alcohol, or iodine. If you have a wound, scrape, corn, or callus on your foot, look at it several times a day to make sure it is healing and not infected. Check for: Redness, swelling, or pain. Fluid or blood. Warmth. Pus or a bad smell. General tips Do not cross your legs. This may decrease blood flow to your feet. Do not use heating pads or hot water bottles on your feet. They may burn your skin. If you have lost feeling in your feet or legs, you may not know this is happening until it is too late. Protect your feet from hot and cold by wearing shoes, such as at the beach or on hot pavement. Schedule a complete foot exam at least once a year or more often if you have foot problems. Report any cuts, sores, or bruises to your health care provider right away. Where to find more information American Diabetes Association: diabetes.org Association of Diabetes Care & Education Specialists: diabeteseducator.org Contact a health care provider if: You have a condition that increases your risk of infection, and you have any cuts, sores, or bruises on your feet. You have an injury that is not  healing. You have redness on your legs or feet. You feel burning or tingling in your legs or feet. You have pain or cramps in your legs and feet. Your legs or feet are numb. Your feet always feel cold. You have pain around any toenails. Get help right away if: You have a wound, scrape, corn, or callus on your foot and: You have signs of infection. You have a fever. You have a red line going up your leg. This information is not intended to replace advice given to you by your health care provider. Make sure you discuss any questions you have with your health  care provider. Document Revised: 02/22/2022 Document Reviewed: 02/22/2022 Elsevier Patient Education  2024 [employer].

## 2023-07-12 ENCOUNTER — Ambulatory Visit (INDEPENDENT_AMBULATORY_CARE_PROVIDER_SITE_OTHER): Payer: Medicare (Managed Care) | Admitting: Podiatry

## 2023-07-12 ENCOUNTER — Encounter: Payer: Self-pay | Admitting: Podiatry

## 2023-07-12 DIAGNOSIS — L84 Corns and callosities: Secondary | ICD-10-CM

## 2023-07-12 DIAGNOSIS — I739 Peripheral vascular disease, unspecified: Secondary | ICD-10-CM | POA: Diagnosis not present

## 2023-07-12 DIAGNOSIS — B351 Tinea unguium: Secondary | ICD-10-CM

## 2023-07-12 DIAGNOSIS — M79675 Pain in left toe(s): Secondary | ICD-10-CM | POA: Diagnosis not present

## 2023-07-12 DIAGNOSIS — M79674 Pain in right toe(s): Secondary | ICD-10-CM | POA: Diagnosis not present

## 2023-07-18 NOTE — Progress Notes (Signed)
Subjective: Chief Complaint  Patient presents with   Foot Ulcer    Follow up left big toe ulcer healing well/ rfc nail trim   72 year old female presents with her daughter who helps translate.  She states the wound in the left big toe has been doing well and no ulceration is identified at this time.  No new lesions.  Nails are thickened elongated causing discomfort And trimmed today.  No swelling or redness of the toenail sites.  Objective: AAO x3, NAD DP/PT pulses decreased bilaterally Nails are hypertrophic, dystrophic, brittle, discolored, elongated 10. No surrounding redness or drainage. Tenderness nails 1-5 bilaterally. No open lesions or pre-ulcerative lesions are identified today. Hyperkeratotic tissue noted to left distal hallux and there is no underlying ulceration, drainage or signs of infection.  Present the lesion has resolved. No pain with calf compression, swelling, warmth, erythema  Assessment: Symptomatic onychomycosis, healed ulceration left hallux  Plan: -All treatment options discussed with the patient including all alternatives, risks, complications.  -Sharp debridement nails x 10 without any complications or bleeding. -Sharply debrided the minimal hyperkeratotic tissue at the left hallux with any complications or bleeding.  Discussed moisturizer, offloading at all times.  Do not apply a moisturizer interdigitally. -Awaiting ABI for baseline study and given decreased pulses on exam.  -Daily foot inspection -Patient encouraged to call the office with any questions, concerns, change in symptoms.   Vivi Barrack DPM

## 2023-09-04 LAB — COLOGUARD: Cologuard Result: NEGATIVE

## 2023-10-11 ENCOUNTER — Ambulatory Visit (INDEPENDENT_AMBULATORY_CARE_PROVIDER_SITE_OTHER): Payer: Medicare (Managed Care)

## 2023-10-11 ENCOUNTER — Encounter: Payer: Self-pay | Admitting: Podiatry

## 2023-10-11 ENCOUNTER — Ambulatory Visit (INDEPENDENT_AMBULATORY_CARE_PROVIDER_SITE_OTHER): Payer: Medicare (Managed Care) | Admitting: Podiatry

## 2023-10-11 DIAGNOSIS — M79674 Pain in right toe(s): Secondary | ICD-10-CM | POA: Diagnosis not present

## 2023-10-11 DIAGNOSIS — B351 Tinea unguium: Secondary | ICD-10-CM

## 2023-10-11 DIAGNOSIS — M79672 Pain in left foot: Secondary | ICD-10-CM

## 2023-10-11 DIAGNOSIS — M79675 Pain in left toe(s): Secondary | ICD-10-CM | POA: Diagnosis not present

## 2023-10-11 DIAGNOSIS — S92412A Displaced fracture of proximal phalanx of left great toe, initial encounter for closed fracture: Secondary | ICD-10-CM | POA: Diagnosis not present

## 2023-10-11 NOTE — Patient Instructions (Signed)
 Toe Fracture  A toe fracture is a break in one of the toe bones (phalanges). What are the causes? A toe fracture may happen if you: Drop a heavy object on your toe. Stub your toe. Twist your toe. Exercise the same way too much. What increases the risk? Playing contact sports. Having weak bones (osteoporosis). Having a low calcium level. What are the signs or symptoms? The main symptoms are swelling and pain in the toe. You may also have: Bruising. Stiffness. Loss of feeling (numbness). A change in the way the toe looks. Broken bones that poke through the skin. Blood under the toenail. How is this treated? Treatments may include: Taping the broken toe to a toe that is next to it (buddy taping). Wearing a shoe that has a wide, rigid sole to protect the toe and to limit its movement. Wearing a cast. A procedure to move the toe back into place. Surgery. This may be needed if: Pieces of broken bone are out of place. The bone pokes through the skin. Physical therapy exercises to help your toe move better and get stronger. Follow these instructions at home: If you have a shoe that can be taken off: Wear the shoe as told by your doctor. Take it off only as told by your doctor. Check the skin around the shoe every day. Tell your doctor if you see problems. Loosen the shoe if your toes: Tingle. Become numb. Turn cold and blue. Keep the shoe clean and dry. If you have a cast that cannot be taken off: Do not put pressure on any part of the cast until it is fully hardened. Do not stick anything inside the cast to scratch your skin. Check the skin around the cast every day. Tell your doctor if you see problems. You may put lotion on dry skin around the cast. Do not put lotion on the skin under the cast. Keep the cast clean and dry. Bathing Do not take baths, swim, or use a hot tub. Ask your doctor about taking showers. If the shoe or cast is not waterproof: Do not let it get  wet. Cover it with a watertight covering when you take a bath or shower. Activity Use crutches to support your body weight. Do not use your injured foot to support your body weight until your doctor says that you can. Ask your doctor what activities are safe for you during recovery. Avoid activities as told by your doctor. Do exercises as told by your doctor. Driving Ask your doctor if you should avoid driving or using machines while you are taking your medicine. Do not drive while wearing a cast on a foot that you use for driving. Managing pain, stiffness, and swelling  If told, put ice on the injured area. If you have a removable shoe, take it off as told by your doctor. Put ice in a plastic bag. Place a towel between your skin and the bag or between your cast and the bag. Leave the ice on for 20 minutes, 2-3 times a day. If your skin turns bright red, take off the ice right away to prevent skin damage. The risk of damage is higher if you cannot feel pain, heat, or cold. Raise the injured area above the level of your heart while you are sitting or lying down. General instructions If your toe was taped to a toe that is next to it, follow your doctor's instructions for changing the gauze and tape. Change it more often if:  The gauze and tape get wet. If this happens, dry the space between the toes. The gauze and tape are too tight and they cause your toe to become pale or to lose feeling (go numb). If your doctor did not give you a protective shoe, wear sturdy shoes that support your foot. Your shoes should not: Pinch your toes. Fit tightly against your toes. Do not smoke or use any products that contain nicotine or tobacco. These can make it take longer for your bones to heal. If you need help quitting, ask your doctor. Take over-the-counter and prescription medicines only as told by your doctor. Keep all follow-up visits. Your doctor will check your foot to see how it is  healing. Contact a doctor if: Your pain medicine is not helping. You have a fever. You notice a bad smell coming from your cast. Get help right away if: You have numbness in your toe or foot, and it is getting worse. Your toe or your foot tingles. Your toe or your foot gets cold or turns blue. You have redness or swelling in your toe or foot, and it is getting worse. You have very bad pain. This information is not intended to replace advice given to you by your health care provider. Make sure you discuss any questions you have with your health care provider. Document Revised: 09/05/2022 Document Reviewed: 09/05/2022 Elsevier Patient Education  2024 [employer].

## 2023-10-11 NOTE — Progress Notes (Signed)
 Subjective: Chief Complaint  Patient presents with   [facility]    RM#13 [facility] patient had fall  month ago left big toe no pain just old bruising.   73 year old female presents with her daughter for the above concerns.  Her nails are thickened elongated she cannot trim them herself.  She also had a fall about 1 month ago and has bruising and pain and swelling to the left big toe.  She is able to walk without any discomfort at this time.  Objective: AAO x3, NAD DP/PT pulses palpable bilaterally, CRT less than 3 seconds Nails are hypertrophic, dystrophic, brittle, discolored, elongated 10. No surrounding redness or drainage. Tenderness nails 1-5 bilaterally. No open lesions or pre-ulcerative lesions are identified today. Edema present to left hallux.  There is no erythema or warmth.  There is no open lesions present.  Toe is rectus. No pain with calf compression, swelling, warmth, erythema  Assessment: Symptomatic onychomycosis, left hallux fracture  Plan: -All treatment options discussed with the patient including all alternatives, risks, complications.  -X-rays were obtained reviewed.  3 views left foot were obtained.  There is evidence of a healing fracture noted along the proximal phalanx of the left hallux with mild displacement present. -ABI ordered 06/28/23- not done -we will follow-up on this. -Sharply debrided nails x 10 without any complications or bleeding -Prefracture recommend immobilization in surgical shoe which was dispensed today to help facilitate healing.  Elevation. -Patient encouraged to call the office with any questions, concerns, change in symptoms.   Donnice JONELLE Fees DPM

## 2023-11-08 ENCOUNTER — Ambulatory Visit (INDEPENDENT_AMBULATORY_CARE_PROVIDER_SITE_OTHER): Payer: Medicare (Managed Care) | Admitting: Podiatry

## 2023-11-08 ENCOUNTER — Encounter: Payer: Self-pay | Admitting: Podiatry

## 2023-11-08 ENCOUNTER — Ambulatory Visit (INDEPENDENT_AMBULATORY_CARE_PROVIDER_SITE_OTHER): Payer: Medicare (Managed Care)

## 2023-11-08 DIAGNOSIS — M778 Other enthesopathies, not elsewhere classified: Secondary | ICD-10-CM | POA: Diagnosis not present

## 2023-11-08 DIAGNOSIS — S92412D Displaced fracture of proximal phalanx of left great toe, subsequent encounter for fracture with routine healing: Secondary | ICD-10-CM

## 2023-11-08 MED ORDER — CICLOPIROX 8 % EX SOLN: Freq: Every day | CUTANEOUS | 2 refills | Status: AC

## 2023-11-13 NOTE — Progress Notes (Signed)
 Subjective: Chief Complaint  Patient presents with   Foot Pain    RM#14 Left foot fracture follow up patient states doing better.    73 year old female presents with her daughter for the above concerns.  She states her foot feels good no significant pain.  She still in surgical shoe.  Some numbness under the big toe.  No open lesions or any new injuries or concerns today.   Objective: AAO x3, NAD-daughter present DP/PT pulses palpable bilaterally, CRT less than 3 seconds There is still some mild edema present to the left hallux but overall improved.  No erythema or warmth and there is no open lesions.  Clinical details about this.  There is no pain on exam although she does have neuropathy.  No other areas of tenderness.  Flexor, extensor tendons intact.  Nails are hypertrophic, dystrophic, brittle, discolored, elongated 10. No surrounding redness or drainage. Tenderness nails 1-5 bilaterally. No open lesions or pre-ulcerative lesions are identified today. Edema present to left hallux.  There is no erythema or warmth.  There is no open lesions present.  Toe is rectus. No pain with calf compression, swelling, warmth, erythema  Assessment: Left hallux fracture  Plan: -All treatment options discussed with the patient including all alternatives, risks, complications.  -X-rays were obtained reviewed.  3 views left foot were obtained.  Fracture along the proximal phalanx without displacement and dorsal angulation.  The results will be increased consolidation present along the fracture site. -Recommend continue immobilization in surgical shoe for right now but discussed gradual transition to regular shoe as tolerated in about 1 week as long as the pain and swelling continues to improve.  Clinically the toe is rectus.  Should the toe becomes symptomatic long-term may need to consider surgical intervention but for now we will continue to treat this conservatively.  Vivi Barrack DPM

## 2023-12-20 ENCOUNTER — Ambulatory Visit: Payer: Medicare (Managed Care) | Admitting: Podiatry

## 2023-12-23 NOTE — Progress Notes (Signed)
 Patient: Shawna Lowe, 04/11/1951, 73y, F  Dialysis Location: [facility]  Attending Nephrologist: Lamar Milroy  Service Date: 12/20/2023  Service Provider: Slater League, NP  I met face to face with the patient today.  OVERVIEW  The patient presented with ESRD on dialysis  Primary cause of renal failure: Hypertensive chronic kidney disease with stage 1 through stage 4 chronic kidney disease, or unspecified chronic kidney disease  Comments: Patient is a transient visiting from out of town and she will be at the unit for the next month. She reports cramping with treatment on Tuesday but none so far today.  Medications and labs reviewed.  DIALYSIS PRESCRIPTION    IHD 3x Week Start date: 12/15/23    Dialyzer: 180NRe Optiflux    BFR: 400    DFR: Manual 500    Potassium: 2.0    Sodium: 137    EDW: 69.7    Duration: 4:00    Calcium: 2.0    Bicarb: 35    Rx updated on: 12/15/2023  TREATMENT ASSESSMENT  Comments: BP is 1860/85, BP typically improves with HD.   BP Sit Pre    12/21/2023: 191/80    12/20/2023: 195/90    12/18/2023: 186/78  BP Sit Post    12/21/2023: 147/62    12/20/2023: 143/71    12/18/2023: 127/61  Tx Duration    12/21/2023: 3:57    12/20/2023: 4:02    12/18/2023: 4:03  Missed Treatments  0 - last 30 days  0 - last 60 days  FLUID ASSESSMENT  Comments: DW reviewed and no changes, patient just started with the unit on Tuesday. No cramping today  EDW (kg)    12/21/2023: 69.7    12/20/2023: 69.7    12/18/2023: 69.7  Weight Pre (kg)    12/21/2023: 73.9    12/20/2023: 74.0    12/18/2023: 74.3  Weight Post (kg)    12/21/2023: 71.5    12/20/2023: 71.6    12/18/2023: 71.4  PWV (kg)    12/21/2023: 1.8    12/20/2023: 1.9    12/18/2023: 1.7  UF Rate (mL/kg/hr)    12/21/2023: 8.5    12/20/2023: 8.3    12/18/2023: 10  ADEQUACY ASSESSMENT  spKt/V, URR    12/13/2023: 2.04, 82.0    11/27/2023:  1.73, 76.0    11/09/2023: 1.92, 80.0  ACCESS ASSESSMENT    Access Type: AVFistula    Access SubType: Standard    Access Status: Active (In Use) - 01/30/2022    Access Location: Left Upper Arm    Created: --/--/----  Flow    12/11/2023: 811    11/02/2023: 816    10/15/2023: 817  Comments: BFR is 400 via LFAVF, no signs of infection  ANEMIA ASSESSMENT  Comments: Hold iron  for ferritin > 1200   HGB, TSAT    12/20/2023: 11.4, -    12/13/2023: 11.6, 27.0    12/06/2023: 11.5, -      Ferritin    11/27/2023: 1494.0    11/21/2023: 1190.0    10/24/2023: 1245.0  Mircera, IVP (mcg)    12/04/2023: 30    11/19/2023: 50    10/22/2023: 120  Iron Sucrose (Venofer) (mg)    10/29/2023: 50    10/22/2023: 50    10/15/2023: 50  BMM ASSESSMENT  Comments: Elevated PTH at 1549 with a calcium of 8.3. This unit does not have Hectorol available, plan to convert to incenter calcitriol 0.75 mcg TIW and continue sensipar per algorithm   PTH, Intact  11/27/2023: 1549.0    11/21/2023: 1159.0    10/31/2023: 831.0      Calcium, Phosphorus    12/13/2023: 8.6, 5.5    11/27/2023: 8.3, 5.4    11/21/2023: 9.0, 5.4  Vitamin D (Calcitriol) Oral (mcg)    12/21/2023: 0.75    12/20/2023: 0.75    12/18/2023: 0.75  Cinacalcet (Sensipar) (mg)    12/21/2023: 30    12/20/2023: 30    12/18/2023: 30  NUTRITION ASSESSMENT  Comments: Hyperkalemia at 6.9, patient states she has been eating bananas. Advised a strict low potassium diet, order is in to check her potassium again today. She is on a 2K bath.  Potassium, Albumin     12/13/2023: 4.7, 4.1    11/29/2023: 4.7, -    11/27/2023: 6.9, 4.4      eNPCR    12/13/2023: 0.91    11/09/2023: 1.58    10/10/2023: 1.14  PHYSICAL EXAM  Comments: On RA, no signs of infection  DIAGNOSIS  Chief Complaint: N18.6 End stage renal disease  Comments: ESRD: Transient, cont HD  SHPT: Start calctirol 0.75  mcg TIW  Hyperkalemia: Low potassium diet and order is in to recheck her potassium today  Patient is stable.    Patient data updated 12/23/2023 at 4:27 PM  Signed By: Luciano Bradley, NP  on 12/23/2023 4:27:41 PM

## 2024-01-10 NOTE — Progress Notes (Signed)
Lab visit

## 2024-01-22 NOTE — Telephone Encounter (Signed)
 Rx for glucometer, strips, and lancets for Freestyle Freedom Lite sent to pharmacy on file.

## 2024-03-17 ENCOUNTER — Ambulatory Visit: Payer: Medicare (Managed Care) | Admitting: Podiatry

## 2024-03-20 ENCOUNTER — Encounter (HOSPITAL_COMMUNITY): Payer: Self-pay | Admitting: Vascular Surgery

## 2024-03-20 ENCOUNTER — Encounter (HOSPITAL_COMMUNITY)
Admission: RE | Disposition: A | Discharge status: Home / Self Care | Payer: Self-pay | Source: Ambulatory Visit | Attending: Vascular Surgery

## 2024-03-20 ENCOUNTER — Ambulatory Visit (HOSPITAL_COMMUNITY)
Admission: RE | Admit: 2024-03-20 | Discharge: 2024-03-20 | Disposition: A | Discharge status: Home / Self Care | Payer: Medicare (Managed Care) | Source: Ambulatory Visit | Attending: Vascular Surgery | Admitting: Vascular Surgery

## 2024-03-20 ENCOUNTER — Other Ambulatory Visit: Payer: Self-pay

## 2024-03-20 DIAGNOSIS — T82858A Stenosis of vascular prosthetic devices, implants and grafts, initial encounter: Secondary | ICD-10-CM

## 2024-03-20 DIAGNOSIS — N186 End stage renal disease: Secondary | ICD-10-CM

## 2024-03-20 DIAGNOSIS — T82898A Other specified complication of vascular prosthetic devices, implants and grafts, initial encounter: Secondary | ICD-10-CM

## 2024-03-20 DIAGNOSIS — Z7984 Long term (current) use of oral hypoglycemic drugs: Secondary | ICD-10-CM | POA: Diagnosis not present

## 2024-03-20 DIAGNOSIS — E1122 Type 2 diabetes mellitus with diabetic chronic kidney disease: Secondary | ICD-10-CM | POA: Diagnosis not present

## 2024-03-20 DIAGNOSIS — T82510A Breakdown (mechanical) of surgically created arteriovenous fistula, initial encounter: Secondary | ICD-10-CM | POA: Diagnosis not present

## 2024-03-20 DIAGNOSIS — Z992 Dependence on renal dialysis: Secondary | ICD-10-CM | POA: Diagnosis not present

## 2024-03-20 DIAGNOSIS — Z794 Long term (current) use of insulin: Secondary | ICD-10-CM | POA: Diagnosis not present

## 2024-03-20 DIAGNOSIS — I120 Hypertensive chronic kidney disease with stage 5 chronic kidney disease or end stage renal disease: Secondary | ICD-10-CM | POA: Insufficient documentation

## 2024-03-20 DIAGNOSIS — Y832 Surgical operation with anastomosis, bypass or graft as the cause of abnormal reaction of the patient, or of later complication, without mention of misadventure at the time of the procedure: Secondary | ICD-10-CM | POA: Diagnosis not present

## 2024-03-20 LAB — GLUCOSE, CAPILLARY: Glucose-Capillary: 233 mg/dL — ABNORMAL HIGH (ref 70–99)

## 2024-03-20 SURGERY — A/V SHUNT INTERVENTION
Anesthesia: LOCAL | Site: Arm Upper | Laterality: Left

## 2024-03-20 MED ORDER — LIDOCAINE HCL (PF) 1 % IJ SOLN
INTRAMUSCULAR | Status: AC
  Filled 2024-03-20: qty 30

## 2024-03-20 MED ORDER — LIDOCAINE HCL (PF) 1 % IJ SOLN
INTRAMUSCULAR | Status: DC | PRN
  Administered 2024-03-20: 2 mL

## 2024-03-20 MED ORDER — HEPARIN (PORCINE) IN NACL 1000-0.9 UT/500ML-% IV SOLN
INTRAVENOUS | Status: DC | PRN
  Administered 2024-03-20: 500 mL via INTRAVENOUS

## 2024-03-20 MED ORDER — IODIXANOL 320 MG/ML IV SOLN
INTRAVENOUS | Status: DC | PRN
  Administered 2024-03-20: 30 mL via INTRAVENOUS

## 2024-03-20 SURGICAL SUPPLY — 9 items
BALLOON MUSTANG 7.0X40 75 (BALLOONS) IMPLANT
KIT ENCORE 26 ADVANTAGE (KITS) IMPLANT
KIT MICROPUNCTURE NIT STIFF (SHEATH) IMPLANT
SET MICROPUNCTURE 5F STIFF (MISCELLANEOUS) IMPLANT
SHEATH PINNACLE R/O II 6F 4CM (SHEATH) IMPLANT
SHEATH PROBE COVER 6X72 (BAG) IMPLANT
TRAY PV CATH (CUSTOM PROCEDURE TRAY) ×2 IMPLANT
TUBING CIL FLEX 10 FLL-RA (TUBING) IMPLANT
WIRE BENTSON .035X145CM (WIRE) IMPLANT

## 2024-03-20 NOTE — Op Note (Signed)
    Patient name: Shawna Lowe MRN: [id number] DOB: [birth year] Sex: female  03/20/2024 Pre-operative Diagnosis: End-stage renal disease, malfunction left arm AV fistula Post-operative diagnosis:  Same Surgeon:  Penne BROCKS. Sheree, MD Procedure Performed: 1.  Percutaneous ultrasound-guided cannulation left arm AV fistula 2.  Left upper extremity fistulogram 3.  Balloon angioplasty of in-stent restenosis cephalic arch proximal and distal stents with 7 mm balloon 4.  Plain balloon angioplasty cephalic vein mid upper arm intra pseudoaneurysmal stenosis with 7 mm balloon   Indications: 73 year old female with history of end-stage renal disease currently dialyzing via left arm AV fistula.  She has had alarming on dialysis and pulsatility recently and is indicated for fistulogram with possible intervention.  Findings: Fistula has 2 large pseudoaneurysms and in between the pseudoaneurysms there is a tight stenosis measuring approximately 70% reduced to less than 20% after balloon angioplasty.  There is a tight stenosis of the proximal and distal stenting of the cephalic arch.  There appears to be a fluency stent central and the central aspect of the stent was balloon angioplastied from greater than 60% stenosis to 0% residual and more peripheral edge of the lateral Viabahn stent was ballooned from 50% stenosis to 0% residual.  At completion there was a much improved thrill through the fistula.    Fistula is okay for use and remains amenable to future percutaneous intervention.   Procedure:  The patient was identified in the holding area and taken to the heart and vascular procedure room.  The patient was then placed supine on the table and prepped and draped in the usual sterile fashion.  A time out was called.  Ultrasound was used to evaluate the left arm AV fistula.  The fistula was anesthetized near the antecubitum with 1% lidocaine  and cannulated with a micropuncture needle followed by wire and sheath  under direct ultrasound visualization and an image was saved the permanent record.  A micropuncture sheath was placed over a wire and a fistulogram was performed with the above findings.  We then placed a Bentson wire followed by a 6 [place] sheath.  We performed balloon angioplasty at nominal pressure for 2 minutes of the proximal and distal stent stenosis and this resolved both stenoses.  We then performed balloon angioplasty in between the 2 pseudoaneurysmal areas at 8 atm for 2 minutes and this resolved without stenosis to less than 20%.  There was a much improved thrill in the fistula.  The sheath and wire were removed and the cannulation site was suture-ligated.  The patient tolerated the procedure well immediate complication.  Contrast: 30 cc   Coy Vandoren C. Sheree, MD [facility] Office: [phone] Pager: [phone]

## 2024-03-20 NOTE — H&P (Signed)
 H+P  History of Present Illness: This is a 73 y.o. female with history of end-stage renal disease on dialysis via left upper extremity AV fistula originally created at a separate hospital facility.  She has undergone revision of this fistula over 2 years ago and insertion of tunneled dialysis catheter at that time.  She is now back on dialysis via the left arm fistula which is having difficulty with alarming on dialysis and has pulsatility.  Patient is [place] does not speak English is accompanied by her daughter today.  Past Medical History:  Diagnosis Date   Asthma    Chronic kidney disease    Diabetes mellitus without complication (HCC)    Dialysis patient (HCC)    Hypertension    Palpitations    Pneumonia    Stroke [facility])     Past Surgical History:  Procedure Laterality Date   AV FISTULA PLACEMENT     avf Left    CORONARY ARTERY BYPASS GRAFT     EYE SURGERY Bilateral    cataract removal   INSERTION OF DIALYSIS CATHETER N/A 11/17/2021   Procedure: INSERTION OF TUNNELED DIALYSIS CATHETER;  Surgeon: Magda Debby SAILOR, MD;  Location: [facility] OR;  Service: Vascular;  Laterality: N/A;   REVISON OF ARTERIOVENOUS FISTULA Left 11/17/2021   Procedure: LEFT UPPER EXTREMITY ARTERIOVENOUS FISTULA REVISION;  Surgeon: Magda Debby SAILOR, MD;  Location: [facility] OR;  Service: Vascular;  Laterality: Left;  PERIPHERAL NERVE BLOCK   TUBAL LIGATION      Allergies  Allergen Reactions   Mosquito (Culex Pipiens) Allergy Skin Test Shortness Of Breath and Rash    And  Trouble  breathing    Lisinopril Hives and Rash    Patient cannot remember the exact nature of the allergy   Penicillins Swelling, Rash and Cough    Swelling,  numbness     Shellfish-Derived Products Hives and Rash    Prior to Admission medications   Medication Sig Start Date End Date Taking? Authorizing Provider  acetaminophen  (TYLENOL ) 500 MG tablet Take 1,000 mg by mouth every 6 (six) hours as needed for moderate pain.    [provider]  albuterol  (PROVENTIL ) (2.5 MG/3ML) 0.083% nebulizer solution Take 2.5 mg by nebulization every 6 (six) hours as needed for wheezing or shortness of breath.    [provider]  albuterol  (VENTOLIN  HFA) 108 (90 Base) MCG/ACT inhaler Inhale 1-2 puffs into the lungs every 6 (six) hours as needed for wheezing or shortness of breath.    [provider]  aspirin 325 MG tablet Take 325 mg by mouth daily.    [provider]  ciclopirox  (PENLAC ) 8 % solution Apply topically at bedtime. Apply over nail and surrounding skin. Apply daily over previous coat. After seven (7) days, may remove with alcohol and continue cycle. 11/08/23   Gershon Donnice SAUNDERS, DPM  cimetidine (TAGAMET) 200 MG tablet Take 200 mg by mouth daily as needed (acid reflux).    [provider]  doxycycline  (VIBRA -TABS) 100 MG tablet Take 1 tablet (100 mg total) by mouth 2 (two) times daily. 06/28/23   Gershon Donnice SAUNDERS, DPM  fluticasone (FLONASE) 50 MCG/ACT nasal spray Place 1-2 sprays into both nostrils daily as needed for allergies.    [provider]  gabapentin (NEURONTIN) 300 MG capsule Take 300 mg by mouth daily.    [provider]  glipiZIDE (GLUCOTROL) 10 MG tablet Take 10 mg by mouth 2 (two) times daily before a meal.    [provider]  HYDROcodone -acetaminophen  (NORCO) 5-325 MG tablet Take 1 tablet by mouth every 6 (six) hours as needed for moderate pain. Patient not taking: Reported on 11/08/2023 11/17/21   Bethanie Cough, PA-C  insulin  glargine (LANTUS) 100 UNIT/ML injection Inject 12 Units into the skin at bedtime.    [provider]  levocetirizine (XYZAL) 5 MG tablet Take 5 mg by mouth daily as needed for allergies.    [provider]  lidocaine -prilocaine (EMLA) cream Apply 1 application. topically as needed (fistula access).    [provider]  losartan (COZAAR) 100 MG tablet Take 100 mg by mouth daily.    [provider]  metoprolol  succinate (TOPROL -XL) 50 MG 24 hr tablet Take 50 mg by mouth daily. Take with or immediately following a meal.    [provider]  montelukast (SINGULAIR) 10 MG tablet Take 10 mg by mouth at bedtime.    [provider]  pantoprazole (PROTONIX) 40 MG tablet Take 40 mg by mouth daily.    [provider]  sevelamer carbonate (RENVELA) 800 MG tablet Take 800 mg by mouth 3 (three) times daily with meals.    [provider]    Social History   Socioeconomic History   Marital status: Widowed    Spouse name: Not on file   Number of children: Not on file   Years of education: Not on file   Highest education level: Not on file  Occupational History   Not on file  Tobacco Use   Smoking status: Never   Smokeless tobacco: Never  Vaping Use   Vaping status: Never Used  Substance and Sexual Activity   Alcohol use: Not Currently   Drug use: Never   Sexual activity: Not on file  Other Topics Concern   Not on file  Social History Narrative   Not on file   Social Drivers of Health   Financial Resource Strain: Not on file  Food Insecurity: Low Risk  (07/05/2023)   Received from [facility]   Hunger Vital Sign    Within the past 12 months, you worried that your food would run out before you got money to buy more: Never true    Within the past 12 months, the food you bought just didn't last and you didn't have money to get more. : Never true  Transportation Needs: No Transportation Needs (07/05/2023)   Received from [facility]    In the past 12 months, has lack of reliable transportation kept you from medical appointments, meetings, work or from getting things needed for daily living? : No  Physical Activity: Not on file  Stress: Not on file  Social Connections: Unknown (05/05/2022)   Received from [facility]   Social Network    Social Network: Not on file  Intimate Partner Violence: Unknown (05/05/2022)    Received from [facility]   HITS    Physically Hurt: Not on file    Insult or Talk Down To: Not on file    Threaten Physical Harm: Not on file    Scream or Curse: Not on file     No family history on file.  ROS: No complaints today   Physical Examination  Vitals:   03/20/24 0753 03/20/24 0810  BP: (!) 203/75 (!) 167/71  Pulse: 96 88  Resp: 12 12  Temp: 98.2 F (36.8 C)   SpO2: 98% 97%   There is no height or weight on file to calculate BMI.  Awake alert oriented Nonlabored respirations Left upper arm AV fistula with pulsatility, there are 2 areas of pseudoaneurysmal degeneration but no areas of thinning skin or scabs  CBC    Component Value Date/Time   HGB 11.2 (L) 11/17/2021 0612   HCT 33.0 (L) 11/17/2021 0612    BMET    Component Value Date/Time   NA 135 11/17/2021 0612   K 3.8 11/17/2021 0612   CL 95 (L) 11/17/2021 0612   GLUCOSE 119 (H) 11/17/2021 0612   BUN 46 (H) 11/17/2021 0612   CREATININE 7.00 (H) 11/17/2021 0612    COAGS: No results found for: INR, PROTIME  ASSESSMENT/PLAN: This is a 73 y.o. female with malfunction of left arm AV fistula alarming on dialysis with pulsatility.  Plan for left upper extremity fistulogram today.  We discussed risk benefits alternatives they agreed to proceed and consent was signed.   Concepcion Kirkpatrick C. Sheree, MD [facility] [place] Office: [phone] Pager: [phone]

## 2024-03-25 ENCOUNTER — Ambulatory Visit: Payer: Medicare (Managed Care) | Admitting: Podiatry

## 2024-06-10 ENCOUNTER — Emergency Department

## 2024-06-10 ENCOUNTER — Emergency Department
Admission: EM | Admit: 2024-06-10 | Discharge: 2024-06-10 | Disposition: A | Discharge status: Home / Self Care | Attending: Emergency Medicine | Admitting: Emergency Medicine

## 2024-06-10 DIAGNOSIS — R748 Abnormal levels of other serum enzymes: Secondary | ICD-10-CM | POA: Insufficient documentation

## 2024-06-10 DIAGNOSIS — R778 Other specified abnormalities of plasma proteins: Secondary | ICD-10-CM | POA: Insufficient documentation

## 2024-06-10 DIAGNOSIS — Z7984 Long term (current) use of oral hypoglycemic drugs: Secondary | ICD-10-CM | POA: Insufficient documentation

## 2024-06-10 DIAGNOSIS — Z992 Dependence on renal dialysis: Secondary | ICD-10-CM | POA: Insufficient documentation

## 2024-06-10 DIAGNOSIS — Z794 Long term (current) use of insulin: Secondary | ICD-10-CM | POA: Insufficient documentation

## 2024-06-10 DIAGNOSIS — R7989 Other specified abnormal findings of blood chemistry: Secondary | ICD-10-CM

## 2024-06-10 DIAGNOSIS — N186 End stage renal disease: Secondary | ICD-10-CM | POA: Insufficient documentation

## 2024-06-10 DIAGNOSIS — E1122 Type 2 diabetes mellitus with diabetic chronic kidney disease: Secondary | ICD-10-CM | POA: Insufficient documentation

## 2024-06-10 DIAGNOSIS — R531 Weakness: Secondary | ICD-10-CM

## 2024-06-10 DIAGNOSIS — I120 Hypertensive chronic kidney disease with stage 5 chronic kidney disease or end stage renal disease: Secondary | ICD-10-CM | POA: Insufficient documentation

## 2024-06-10 DIAGNOSIS — R5383 Other fatigue: Secondary | ICD-10-CM | POA: Insufficient documentation

## 2024-06-10 DIAGNOSIS — I10 Essential (primary) hypertension: Secondary | ICD-10-CM

## 2024-06-10 LAB — LAB USE ONLY - CBC WITH DIFFERENTIAL
Absolute Basophils: 0.06 x10 3/uL (ref 0.00–0.08)
Absolute Eosinophils: 0.26 x10 3/uL (ref 0.00–0.44)
Absolute Immature Granulocytes: 0.02 x10 3/uL (ref 0.00–0.07)
Absolute Lymphocytes: 1.47 x10 3/uL (ref 0.42–3.22)
Absolute Monocytes: 0.58 x10 3/uL (ref 0.21–0.85)
Absolute Neutrophils: 6.84 x10 3/uL — ABNORMAL HIGH (ref 1.10–6.33)
Absolute nRBC: 0 x10 3/uL (ref <=0.00)
Basophils %: 0.7 %
Eosinophils %: 2.8 %
Hematocrit: 35.6 % (ref 34.7–43.7)
Hemoglobin: 11.2 g/dL — ABNORMAL LOW (ref 11.4–14.8)
Immature Granulocytes %: 0.2 %
Lymphocytes %: 15.9 %
MCH: 23 pg — ABNORMAL LOW (ref 25.1–33.5)
MCHC: 31.5 g/dL (ref 31.5–35.8)
MCV: 73 fL — ABNORMAL LOW (ref 78.0–96.0)
MPV: 10.7 fL (ref 8.9–12.5)
Monocytes %: 6.3 %
Neutrophils %: 74.1 %
Platelet Count: 270 x10 3/uL (ref 142–346)
Preliminary Absolute Neutrophil Count: 6.84 x10 3/uL — ABNORMAL HIGH (ref 1.10–6.33)
RBC: 4.88 x10 6/uL (ref 3.90–5.10)
RDW: 17 % — ABNORMAL HIGH (ref 11–15)
WBC: 9.23 x10 3/uL (ref 3.10–9.50)
nRBC %: 0 /100{WBCs} (ref <=0.0)

## 2024-06-10 LAB — HIGH SENSITIVITY TROPONIN-I: hs Troponin: 20.4 ng/L — ABNORMAL HIGH (ref <=14.0)

## 2024-06-10 LAB — COMPREHENSIVE METABOLIC PANEL
ALT: 17 U/L (ref <=55)
AST (SGOT): 39 U/L (ref <=41)
Albumin/Globulin Ratio: 0.9 (ref 0.9–2.2)
Albumin: 4.4 g/dL (ref 3.5–4.9)
Alkaline Phosphatase: 376 U/L — ABNORMAL HIGH (ref 37–117)
Anion Gap: 16 — ABNORMAL HIGH (ref 5.0–15.0)
BUN: 22 mg/dL — ABNORMAL HIGH (ref 7–21)
Bilirubin, Total: 0.4 mg/dL (ref 0.2–1.2)
CO2: 28 meq/L (ref 17–29)
Calcium: 10 mg/dL (ref 7.9–10.2)
Chloride: 97 meq/L — ABNORMAL LOW (ref 99–111)
Creatinine: 3.4 mg/dL — ABNORMAL HIGH (ref 0.4–1.0)
GFR: 13.7 mL/min/1.73 m2 — ABNORMAL LOW (ref >=60.0)
Globulin: 4.8 g/dL — ABNORMAL HIGH (ref 2.0–3.6)
Glucose: 116 mg/dL — ABNORMAL HIGH (ref 70–100)
Potassium: 3.8 meq/L (ref 3.5–5.3)
Protein, Total: 9.2 g/dL — ABNORMAL HIGH (ref 6.0–8.3)
Sodium: 141 meq/L (ref 135–145)

## 2024-06-10 LAB — HIGH SENSITIVITY TROPONIN-I WITH DELTA
hs Troponin-I Delta: 2
hs Troponin: 18.4 ng/L — ABNORMAL HIGH (ref <=14.0)

## 2024-06-10 LAB — COVID-19 (SARS-COV-2) & INFLUENZA  A/B, NAA (ROCHE LIAT)
Influenza A RNA: NOT DETECTED
Influenza B RNA: NOT DETECTED
SARS-CoV-2 (COVID-19) RNA: NOT DETECTED

## 2024-06-10 MED ORDER — LABETALOL HCL 5 MG/ML IV SOLN (WRAP)
10.0000 mg | Freq: Once | INTRAVENOUS | Status: AC
Start: 2024-06-10 — End: 2024-06-10
  Administered 2024-06-10: 10 mg via INTRAVENOUS
  Filled 2024-06-10: qty 4

## 2024-06-10 NOTE — Nursing Progress Note (Signed)
 Pt BIBA s/p dialysis treatment - pt was at dialysis and became hypotensive - pt awake and alert - daughter at bedside.

## 2024-06-11 LAB — LAB USE ONLY - LAVENDER - EDTA HOLD TUBE

## 2024-06-11 LAB — ECG 12-LEAD
Atrial Rate: 92 {beats}/min
IHS MUSE NARRATIVE AND IMPRESSION: NORMAL
P Axis: 51 degrees
P-R Interval: 164 ms
Q-T Interval: 370 ms
QRS Duration: 82 ms
QTC Calculation (Bezet): 457 ms
R Axis: 13 degrees
T Axis: 150 degrees
Ventricular Rate: 92 {beats}/min

## 2024-06-11 LAB — LAB USE ONLY - LIGHT BLUE - CITRATE HOLD TUBE

## 2024-06-11 LAB — LAB USE ONLY - GOLD SST HOLD TUBE

## 2024-06-11 LAB — LAB USE ONLY - LIGHT GREEN - LIHEP PST HOLD TUBE

## 2024-06-13 NOTE — ED Provider Notes (Signed)
 [facility]  Emergency Department Physician Note      Diagnosis/Disposition     ED Disposition:  Discharge from ED Observation    ED Diagnosis:     Status post dialysis  Fatigue, unspecified type  ESRD (end stage renal disease) (CMS/HCC)  Elevated troponin  Elevated blood pressure reading with diagnosis of hypertension  Blood alkaline phosphatase increased compared with prior measurement    Discharge Medication List as of 06/10/2024  9:03 PM            Daughter present and providing [place] interpretation.    History of Present Illness     Nursing Triage Note:      Chief Complaint: Fatigue and Hypotension      History of Present Illness  Jacqueline Weber is a 72 year old female with diabetes who presents with fatigue during dialysis. She is accompanied by her daughter.    During a recent dialysis session, she appears to be more lethargic.  This occurred after about 3 hours of dialysis, with about half an hour remaining in the dialysis session.  Dialysis was not completed..  Patient currently has no complaints.  Says that she just feels fatigued.  patient reports that she just felt generally weak.  She was breathing on her own.  No resuscitative measures were or sternal rub was required.  Denies shortness of breath, chest pain, headache, dizziness, or any other symptoms.        Presenting acute/chronic problem(s): See above    Differential diagnosis to include but not limited to:     Chronic illness impacting care and increasing risk of presenting acute or chronic problems (obesity as defined as BMI>30, diabetes, hypertension, cad, elderly as defined as age 66 years or older): ESRD, elderly    Number and Complexity of Presenting Problem(s) Addressed, Complexity: High: 1 acute or chronic illness or injury that poses a threat to life or bodily function.     Review of Systems   Constitutional:  Positive for fatigue.   Neurological:  Positive for weakness.   All other systems reviewed and are  negative.        Physical Exam     Pulse 94  BP 200/76  Resp 16  SpO2 97 %  Temp 98.4 F (36.9 C)    Physical Exam  Vitals and nursing note reviewed.   Constitutional:       General: She is not in acute distress.     Appearance: Normal appearance. She is not ill-appearing or toxic-appearing.   HENT:      Mouth/Throat:      Mouth: Mucous membranes are moist.   Cardiovascular:      Rate and Rhythm: Normal rate and regular rhythm.      Pulses: Normal pulses.      Heart sounds: Normal heart sounds.   Pulmonary:      Effort: Pulmonary effort is normal. No respiratory distress.      Breath sounds: Normal breath sounds. No wheezing.   Abdominal:      General: Abdomen is flat. Bowel sounds are normal. There is no distension.      Palpations: Abdomen is soft.      Tenderness: There is no abdominal tenderness.   Musculoskeletal:      Cervical back: Neck supple.      Right lower leg: No edema.      Left lower leg: No edema.   Skin:     General: Skin is warm and dry.  Neurological:      General: No focal deficit present.      Mental Status: She is alert. Mental status is at baseline.   Psychiatric:         Mood and Affect: Mood normal.           Medical Decision Making        Amount/complexity of Data Reviewed   L\  Look below for information    History obtained from another historian (parent, spouse, caregiver, ems) : See HPI        Independent interpretation of  EKG:     EKG Interpretation  EKG interpreted independently by me:    Rate: Normal  Rhythm: sinus rhythm  Axis: Normal  ST-T Segments: nonspec st-t changes  Conduction: No blocks  Impression: Normal EKG    Independent visualized and interpretation of radiological study by me:     Type of radiological study performed : cxr  Independent Interpretation by me: pulm edema    Diagnostic tests appropriately considered even if not ultimately performed: ct chest      MDM  73 year old female presenting with episode of fatigue and unresponsiveness at the end of her dialysis  session.  Eyes remained open, was breathing on her own.  No CPR required.  No sternal rub required.  There was no LOC.  Patient states that she just felt tired.  Per daughter, this frequently occurs on the days that she has dialysis.  Her kidney function appears to be at baseline.  Trope mildly elevated, likely secondary to decreased renal clearance.    ED Course as of 06/13/24 1330   Tue Jun 10, 2024   1610 Independent interpretation of EKG  Normal sinus rhythm  Nonspecific ST changes  No ST elevations or depressions  Normal axis and intervals  Abnormal EKG [LY]   2056 Troponins are downtrending. 20.4 and then 18.4. Pt without chest pain or shortness of breath. No st elevations on EKG. Likely demand ischemia and decreased renal clearance given ESRD.   Results discussed with aptient and her daughter. They are eager for discharge. Pt is well appearing. Stable for [place] [LY]      ED Course User Index  [LY] Ary Rock BRAVO, MD                               --    Each of the differential diagnoses has been considered for diagnosis and weighed risk and benefit of further testing and evaluation in the context of patient complaint. Some diagnosis do not warrant further testing including imaging and/or labs. However, all differential diagnosis have been considered.       There was shared decision making with the patient (and/or family) and we are in agreement with the plan. I considered the differential diagnosis and based on the clinical presentation and findings today I do not believe further workup and/or escalation to the ED is necessary.        All labs and vital signs from the current visit have been reviewed and any abnormality that is present is not due to severe sepsis or septic shock.    Vital Signs: Reviewed the patient's vital signs.   Nursing Notes: Reviewed and utilized available nursing notes.  Medical Records Reviewed: Reviewed available past medical records.  Counseling: The emergency provider has spoken with  the patient and her daughter and discussed today's findings, in addition to providing specific details  for the plan of care.  Questions were answered and there is agreement with the plan.      The patient also instructed to return to the emergency department/call 9-1-1 for any new, worse, or continued symptoms or concerns. All questions were answered to their satisfaction and the patient was discharged in stable condition.  They were given information and counseled regarding testing done today, diagnosis, and need for follow-up. Limitations of this evaluation including the need for possible further care, treatment, and testing were also discussed.  The patient understands the diagnosis, reasons to return to the ED, and the importance of follow-up with primary care physician or specialist as instructed.       Prescriptions:  Discharge Medication List as of 06/10/2024  9:03 PM        CONTINUE these medications which have NOT CHANGED    Details   ADVAIR  DISKUS 250-50 MCG/DOSE Aerosol Powder, Breath Activtivatede INHALE 1 DOSE BY MOUTH TWICE DAILY, Historical Med      aspirin  EC 325 MG EC tablet Take 1 tablet (325 mg total) by mouth daily., Starting Thu 06/04/2014, No Print      bisoprolol (ZEBETA) 5 MG tablet Take 5 mg by mouth daily, Starting Fri 01/03/2019, Historical Med      carvedilol  (COREG ) 3.125 MG tablet Take 3.125 mg by mouth daily.    , Historical Med      Cholecalciferol  (VITAMIN D3) 2000 units Tab Take 4,000 IU by mouth daily., Historical Med      ferrous sulfate  324 (65 FE) MG Tablet Delayed Response TAKE 1 TABLET BY MOUTH IN THE MORNING WITH BREAKFAST, Normal      fluticasone  (FLONASE ) 50 MCG/ACT nasal spray Use 2 spray(s) in each nostril once daily, Normal      glipiZIDE  (GLUCOTROL ) 10 MG tablet TAKE 1 TABLET BY MOUTH TWICE DAILY BEFORE MEAL(S), E-Rx      hydrALAZINE  (APRESOLINE ) 25 MG tablet Take 25 mg by mouth 2 (two) times daily, Historical Med      insulin  glargine (BASAGLAR  KWIKPEN) 100 UNIT/ML  injection pen Inject 10 Units into the skin nightly, Starting Fri 06/13/2019, E-Rx      Insulin  Pen Needle (B-D UF III MINI PEN NEEDLES) 31G X 5 MM Misc Inject insulin  nightly as instructed, E-Rx      montelukast (SINGULAIR) 10 MG tablet as needed., Starting Tue 08/07/2017, Historical Med      Olopatadine  HCl 0.2 % Solution INSTILL 1 DROP INTO AFFECTED EYE ONCE DAILY, Normal      ONE TOUCH ULTRA TEST test strip Check blood sugar 2 times daily, Normal      ONETOUCH DELICA LANCETS 33G Misc Check BS BID, Normal      pantoprazole  (PROTONIX ) 40 MG tablet TAKE 1 TABLET BY MOUTH IN THE MORNING BEFORE BREAKFAST, Normal      RENVELA 800 MG tablet 1,600 mg 3 (three) times daily with meals.    , Starting Fri 08/03/2017, Historical Med      simvastatin  (ZOCOR ) 20 MG tablet Take 20 mg by mouth nightly.    , Starting Wed 08/16/2016, Historical Med      Umeclidinium Bromide (INCRUSE ELLIPTA IN) Inhale into the lungs daily    , Historical Med                   DIAGNOSIS      Diagnosis:  Final diagnoses:   Status post dialysis   Fatigue, unspecified type   ESRD (end stage renal disease) (CMS/HCC)  Elevated troponin   Elevated blood pressure reading with diagnosis of hypertension   Blood alkaline phosphatase increased compared with prior measurement       Disposition:  ED Disposition       ED Disposition   Discharge from ED Observation    Condition   Stable    Date/Time   Tue Jun 10, 2024  8:59 PM    Comment   Jacqueline Weber discharge to home/self care.                     This note was generated by the Epic EMR system/Dragon speech recognition and may contain inherent errors or omissions not intended by the user. Grammatical errors, random word insertions, deletions, pronoun errors and incomplete sentences are occasional consequences of this technology due to software limitations. Not all errors are caught or corrected. If there are questions or concerns about the content of this note or information contained within the body of this  dictation they should be addressed directly with the author for clarification.    My documentation is often completed after the patient is no longer under my clinical care.   In some cases, the Epic EMR may pull updated results into the above documentation which may not reflect all results or information that was available to me at the time of my medical decision making.      If there are questions or concerns about the content of this note or information contained within the body of this dictation they should be addressed directly with the author for clarification  Procedures   Procedures        Supplemental Encounter Data   Medical History[7]  Past Surgical History[8]  Social History[9]  Family History[10]  Allergies[11]    Encounter Orders:  Orders Placed This Encounter   Procedures    COVID-19 and Influenza ([place]) (symptomatic)    XR Chest  AP Portable    Rainbow Draw    Gold SST Hold Tube    Light Green - LiHep PST Hold Tube    Light Blue - Citrate Hold Tube    Lavender - EDTA Hold Tube    CBC with Differential (Order)    Comprehensive Metabolic Panel    High Sensitivity Troponin-I at 0 hrs    High Sensitivity Troponin-I at 2 hrs with calculated Delta    CBC with Differential (Component)    ECG 12 lead     Medications Administered:  Medications   labetalol  (NORMODYNE ,TRANDATE ) injection 10 mg (10 mg Intravenous Given 06/10/24 2032)     Laboratory and Imaging Studies:     XR Chest  AP Portable   Final Result         1. Slight vascular prominence, likely mild edema.   2. No pneumonia.      Norleen Alverta Confer, MD   06/10/2024 5:05 PM                   [7]   Past Medical History:  Diagnosis Date    Anemia 10/02/2016    H/O ANEMIA - REFLECTED ON CURRENT LABS.    Asthma NA    Cataracts, bilateral     H/O CATARACTS - SURGERY DONE - STILL HAS SOME ISSUES.    Cerebrovascular accident (CMS/HCC) 2014    H/O STROKE - WEAKNESS ON LEFT SIDE. NO NEUROLOGIST. USES CANE FOR BALANCE.    Chronic renal failure, stage 5 (CMS/HCC)  05/08/2017    Constipation  Coronary artery disease 06/2016    H/O QUADRUPLE BYPASS SURGERY - FOLLOWED BY [facility].    Diabetes mellitus (CMS/HCC)     Difficulty walking     WALKS WITH CANE. WILL USE WC IN HOSPITAL    End-stage renal disease (CMS/HCC) 04/2017    DIALYSIS [facility] [place] [id number]. 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    Hypertension     CONTROLLED WITH MEDS.    Myocardial infarction (CMS/HCC) 06/2016    Osteopenia of multiple sites 10/30/2018    Pre-diabetes     Renal insufficiency     CHRONIC KIDNEY DISEASE - FOLLOWED BY DR. CURLY.    Shortness of breath     MODERATE EXERTION     Type 2 diabetes mellitus, controlled (CMS/HCC)     AM FSBS 160-170. INSTRUCTED TO CONTROL DIET CLOSELY TONIGHT.  HAD BEEN OFF JANUVIA , RESTARTED 06/15/17. HA1C 9 06/15/17. MANAGED BY PCP.    [8]   Past Surgical History:  Procedure Laterality Date    BRONCHOSCOPY, FIBEROPTIC N/A 06/21/2017    Procedure: BRONCHOSCOPY with brushing and wash;  Surgeon: Fernand Arlyn Lazier, MD;  Location: [facility];  Service: Pulmonary;  Laterality: N/A;    BRONCHOSCOPY, FIBEROPTIC N/A 05/22/2019    Procedure: BRONCHOSCOPY w/ wash & brush;  Surgeon: Fernand Arlyn DEL, MD;  Location: [facility];  Service: Pulmonary;  Laterality: N/A;    CORONARY ARTERY BYPASS N/A 06/12/2016    Procedure: Coronary Artery Bypass x 4.;  Surgeon: Clent Camellia CROME, MD;  Location: [facility];  Service: Cardiothoracic;  Laterality: N/A;  LIMA to LDA  Vein to PDA  Vein to OM  Vein to Diagonal    EGD, COLONOSCOPY N/A 10/04/2016    EGD, COLONOSCOPY N/A 10/04/2016    Procedure: EGD w/ bx, COLONOSCOPY w/ bx, polypectomy;  Surgeon: Theador Colin RAMAN, MD;  Location: [facility];  Service: Gastroenterology;  Laterality: N/A;    ENDOSCOPIC,VEIN HARVEST N/A 06/12/2016    Procedure:  Endoscopic, Left Leg Greater Saphenous Vein Harvest;  Surgeon: Clent Camellia CROME, MD;  Location: [facility];  Service: Cardiothoracic;  Laterality: N/A;  Left Leg Incision (medially, groin to ankle) @ 0742  Vein out of leg @ 0826  Vein prep complete @ 0845  Total time = 63 minutes    FORMATION, ARTERIOVENOUS FISTULA, UPPER EXTREMITY Left 05/31/2017    Procedure: FORMATION, UPPER EXTREMITY, A-V FISTULA;  Surgeon: Aurelio Arvis LABOR, MD;  Location: [facility];  Service: Vascular;  Laterality: Left;  LEFT ARM A-V FISTULA      REVISION, A-V FISTULA UPPER EXTREMITY Left 07/16/2017    Procedure: REVISION, A-V FISTULA UPPER EXTREMITY WITH PATCH ANGIOPLASTY;  Surgeon: Aurelio Arvis LABOR, MD;  Location: [facility];  Service: Vascular;  Laterality: Left;   [9]   Social History  Tobacco Use    Smoking status: Never    Smokeless tobacco: Never   Vaping Use    Vaping status: Never Used   Substance Use Topics    Alcohol  use: No    Drug use: No   [10]   Family History  Problem Relation Name Age of Onset    No known problems Mother      No known problems  Father     [11]   Allergies  Allergen Reactions    Mosquito (Culex Pipiens) Allergy Skin Test Shortness Of Breath     And  Trouble  breathing    Shellfish Allergy Hives    Shellfish-Derived Products Hives     New  allergy  New  allergy    Lisinopril  Hives and Rash     Patient cannot remember the exact nature of the allergy    Penicillins Rash and Swelling     numbness  Swelling,  numbness        Ary Rock BRAVO, MD  06/13/24 1330

## 2024-10-04 ENCOUNTER — Emergency Department

## 2024-10-04 ENCOUNTER — Inpatient Hospital Stay
Admission: EM | Admit: 2024-10-04 | Discharge: 2024-10-06 | DRG: 871 | Disposition: A | Attending: Hospitalist | Admitting: Hospitalist

## 2024-10-04 ENCOUNTER — Inpatient Hospital Stay

## 2024-10-04 DIAGNOSIS — T380X5A Adverse effect of glucocorticoids and synthetic analogues, initial encounter: Secondary | ICD-10-CM | POA: Diagnosis present

## 2024-10-04 DIAGNOSIS — J9601 Acute respiratory failure with hypoxia: Secondary | ICD-10-CM | POA: Diagnosis present

## 2024-10-04 DIAGNOSIS — E7800 Pure hypercholesterolemia, unspecified: Secondary | ICD-10-CM | POA: Diagnosis present

## 2024-10-04 DIAGNOSIS — E1122 Type 2 diabetes mellitus with diabetic chronic kidney disease: Secondary | ICD-10-CM | POA: Diagnosis present

## 2024-10-04 DIAGNOSIS — E66811 Obesity, class 1: Secondary | ICD-10-CM | POA: Diagnosis present

## 2024-10-04 DIAGNOSIS — I69354 Hemiplegia and hemiparesis following cerebral infarction affecting left non-dominant side: Secondary | ICD-10-CM

## 2024-10-04 DIAGNOSIS — E1165 Type 2 diabetes mellitus with hyperglycemia: Secondary | ICD-10-CM | POA: Diagnosis present

## 2024-10-04 DIAGNOSIS — Z794 Long term (current) use of insulin: Secondary | ICD-10-CM

## 2024-10-04 DIAGNOSIS — J690 Pneumonitis due to inhalation of food and vomit: Secondary | ICD-10-CM | POA: Diagnosis present

## 2024-10-04 DIAGNOSIS — R55 Syncope and collapse: Secondary | ICD-10-CM

## 2024-10-04 DIAGNOSIS — R030 Elevated blood-pressure reading, without diagnosis of hypertension: Secondary | ICD-10-CM

## 2024-10-04 DIAGNOSIS — R0902 Hypoxemia: Secondary | ICD-10-CM

## 2024-10-04 DIAGNOSIS — J189 Pneumonia, unspecified organism: Secondary | ICD-10-CM | POA: Diagnosis present

## 2024-10-04 DIAGNOSIS — N186 End stage renal disease: Secondary | ICD-10-CM | POA: Diagnosis present

## 2024-10-04 DIAGNOSIS — E1142 Type 2 diabetes mellitus with diabetic polyneuropathy: Secondary | ICD-10-CM | POA: Diagnosis present

## 2024-10-04 DIAGNOSIS — J9801 Acute bronchospasm: Secondary | ICD-10-CM | POA: Diagnosis present

## 2024-10-04 DIAGNOSIS — A419 Sepsis, unspecified organism: Principal | ICD-10-CM | POA: Diagnosis present

## 2024-10-04 DIAGNOSIS — E782 Mixed hyperlipidemia: Secondary | ICD-10-CM | POA: Diagnosis present

## 2024-10-04 DIAGNOSIS — J440 Chronic obstructive pulmonary disease with acute lower respiratory infection: Secondary | ICD-10-CM | POA: Diagnosis present

## 2024-10-04 DIAGNOSIS — J479 Bronchiectasis, uncomplicated: Secondary | ICD-10-CM | POA: Diagnosis present

## 2024-10-04 DIAGNOSIS — G928 Other toxic encephalopathy: Secondary | ICD-10-CM | POA: Diagnosis present

## 2024-10-04 DIAGNOSIS — Z951 Presence of aortocoronary bypass graft: Secondary | ICD-10-CM

## 2024-10-04 DIAGNOSIS — Z7984 Long term (current) use of oral hypoglycemic drugs: Secondary | ICD-10-CM

## 2024-10-04 DIAGNOSIS — K219 Gastro-esophageal reflux disease without esophagitis: Secondary | ICD-10-CM | POA: Diagnosis present

## 2024-10-04 DIAGNOSIS — Z79899 Other long term (current) drug therapy: Secondary | ICD-10-CM

## 2024-10-04 DIAGNOSIS — R4182 Altered mental status, unspecified: Principal | ICD-10-CM | POA: Diagnosis present

## 2024-10-04 DIAGNOSIS — D631 Anemia in chronic kidney disease: Secondary | ICD-10-CM

## 2024-10-04 DIAGNOSIS — D72829 Elevated white blood cell count, unspecified: Secondary | ICD-10-CM | POA: Diagnosis present

## 2024-10-04 DIAGNOSIS — Z8673 Personal history of transient ischemic attack (TIA), and cerebral infarction without residual deficits: Secondary | ICD-10-CM

## 2024-10-04 DIAGNOSIS — I252 Old myocardial infarction: Secondary | ICD-10-CM

## 2024-10-04 DIAGNOSIS — I351 Nonrheumatic aortic (valve) insufficiency: Secondary | ICD-10-CM | POA: Diagnosis present

## 2024-10-04 DIAGNOSIS — Z7982 Long term (current) use of aspirin: Secondary | ICD-10-CM

## 2024-10-04 DIAGNOSIS — I10 Essential (primary) hypertension: Secondary | ICD-10-CM | POA: Diagnosis present

## 2024-10-04 DIAGNOSIS — R6510 Systemic inflammatory response syndrome (SIRS) of non-infectious origin without acute organ dysfunction: Secondary | ICD-10-CM

## 2024-10-04 DIAGNOSIS — I2510 Atherosclerotic heart disease of native coronary artery without angina pectoris: Secondary | ICD-10-CM | POA: Diagnosis present

## 2024-10-04 DIAGNOSIS — I1311 Hypertensive heart and chronic kidney disease without heart failure, with stage 5 chronic kidney disease, or end stage renal disease: Secondary | ICD-10-CM | POA: Diagnosis present

## 2024-10-04 DIAGNOSIS — Z992 Dependence on renal dialysis: Secondary | ICD-10-CM

## 2024-10-04 DIAGNOSIS — Z6831 Body mass index (BMI) 31.0-31.9, adult: Secondary | ICD-10-CM

## 2024-10-04 DIAGNOSIS — R7989 Other specified abnormal findings of blood chemistry: Secondary | ICD-10-CM

## 2024-10-04 DIAGNOSIS — Z7951 Long term (current) use of inhaled steroids: Secondary | ICD-10-CM

## 2024-10-04 DIAGNOSIS — R6520 Severe sepsis without septic shock: Secondary | ICD-10-CM | POA: Diagnosis present

## 2024-10-04 LAB — ARTERIAL BLOOD GAS
Arterial Base Excess: 9.3 meq/L — ABNORMAL HIGH (ref <=2.7)
Arterial CO2: 35 meq/L — ABNORMAL HIGH (ref 23.0–30.0)
Arterial HCO3: 33.6 meq/L — ABNORMAL HIGH (ref 23.0–28.0)
Arterial O2 Saturation: 91.2 % — ABNORMAL LOW (ref 94.0–98.0)
Arterial pCO2 Corrected: 44.3 mmHg (ref 32.0–48.0)
Arterial pCO2: 44.3 mmHg (ref 32.0–48.0)
Arterial pH Corrected: 7.489 — ABNORMAL HIGH (ref 7.350–7.450)
Arterial pH: 7.489 — ABNORMAL HIGH (ref 7.350–7.450)
Arterial pO2 Corrected: 58.9 mmHg — ABNORMAL LOW (ref 83.0–108.0)
Arterial pO2: 58.9 mmHg — ABNORMAL LOW (ref 83.0–108.0)
O2 Liter Flow: 3 L/min
Temperature: 37 C

## 2024-10-04 LAB — COMPREHENSIVE METABOLIC PANEL
ALT: 9 U/L (ref <=55)
AST (SGOT): 26 U/L (ref <=41)
Albumin/Globulin Ratio: 0.7 — ABNORMAL LOW (ref 0.9–2.2)
Albumin: 3.7 g/dL (ref 3.5–4.9)
Alkaline Phosphatase: 209 U/L — ABNORMAL HIGH (ref 37–117)
Anion Gap: 15 (ref 5.0–15.0)
BUN: 13 mg/dL (ref 7–21)
Bilirubin, Total: 0.6 mg/dL (ref 0.2–1.2)
CO2: 29 meq/L (ref 17–29)
Calcium: 9.5 mg/dL (ref 7.9–10.2)
Chloride: 93 meq/L — ABNORMAL LOW (ref 99–111)
Creatinine: 2.9 mg/dL — ABNORMAL HIGH (ref 0.4–1.0)
GFR: 16.5 mL/min/1.73 m2 — ABNORMAL LOW (ref >=60.0)
Globulin: 5.1 g/dL — ABNORMAL HIGH (ref 2.0–3.6)
Glucose: 183 mg/dL — ABNORMAL HIGH (ref 70–100)
Potassium: 4.1 meq/L (ref 3.5–5.3)
Protein, Total: 8.8 g/dL — ABNORMAL HIGH (ref 6.0–8.3)
Sodium: 137 meq/L (ref 135–145)

## 2024-10-04 LAB — LAB USE ONLY - CBC WITH DIFFERENTIAL
Absolute Basophils: 0.04 10*3/uL (ref 0.00–0.08)
Absolute Eosinophils: 0.13 10*3/uL (ref 0.00–0.44)
Absolute Immature Granulocytes: 0.06 10*3/uL (ref 0.00–0.07)
Absolute Lymphocytes: 1.74 10*3/uL (ref 0.42–3.22)
Absolute Monocytes: 0.9 10*3/uL — ABNORMAL HIGH (ref 0.21–0.85)
Absolute Neutrophils: 12.14 10*3/uL — ABNORMAL HIGH (ref 1.10–6.33)
Absolute nRBC: 0 10*3/uL (ref <=0.00)
Basophils %: 0.3 %
Eosinophils %: 0.9 %
Hematocrit: 32 % — ABNORMAL LOW (ref 34.7–43.7)
Hemoglobin: 9.7 g/dL — ABNORMAL LOW (ref 11.4–14.8)
Immature Granulocytes %: 0.4 %
Lymphocytes %: 11.6 %
MCH: 21.7 pg — ABNORMAL LOW (ref 25.1–33.5)
MCHC: 30.3 g/dL — ABNORMAL LOW (ref 31.5–35.8)
MCV: 71.7 fL — ABNORMAL LOW (ref 78.0–96.0)
Monocytes %: 6 %
Neutrophils %: 80.8 %
Platelet Count: 158 10*3/uL (ref 142–346)
Preliminary Absolute Neutrophil Count: 12.14 10*3/uL — ABNORMAL HIGH (ref 1.10–6.33)
RBC: 4.46 10*6/uL (ref 3.90–5.10)
RDW: 19 % — ABNORMAL HIGH (ref 11–15)
WBC: 15.01 10*3/uL — ABNORMAL HIGH (ref 3.10–9.50)
nRBC %: 0 /100{WBCs} (ref <=0.0)

## 2024-10-04 LAB — ECG 12-LEAD
Atrial Rate: 126 {beats}/min
P Axis: 1 degrees
P-R Interval: 156 ms
Q-T Interval: 298 ms
QRS Duration: 82 ms
QTC Calculation (Bezet): 431 ms
R Axis: 14 degrees
T Axis: 241 degrees
Ventricular Rate: 126 {beats}/min

## 2024-10-04 LAB — PT AND APTT
INR: 1 (ref 0.9–1.1)
PT: 12 s (ref 10.1–12.9)
PTT: 34 s (ref 27–39)

## 2024-10-04 LAB — NARES, MRSA (METHICILLIN-RESISTANT STAPHYLOCOCCUS AUREUS) SCREENING, PCR: MRSA (methicillin resistant Staphylococcus aureus) DNA: NOT DETECTED

## 2024-10-04 LAB — COVID-19 (SARS-COV-2) & INFLUENZA  A/B, NAA (ROCHE LIAT)
Influenza A RNA: NOT DETECTED
Influenza B RNA: NOT DETECTED
SARS-CoV-2 (COVID-19) RNA: NOT DETECTED

## 2024-10-04 LAB — WHOLE BLOOD GLUCOSE POCT
Whole Blood Glucose POCT: 185 mg/dL — ABNORMAL HIGH (ref 70–100)
Whole Blood Glucose POCT: 167 mg/dL — ABNORMAL HIGH (ref 70–100)

## 2024-10-04 LAB — HIGH SENSITIVITY TROPONIN-I: hs Troponin: 8.6 ng/L (ref <=14.0)

## 2024-10-04 LAB — MAGNESIUM: Magnesium: 1.7 mg/dL (ref 1.6–2.6)

## 2024-10-04 LAB — LACTIC ACID
Whole Blood Lactic Acid: 1.3 mmol/L (ref 0.4–2.0)
Whole Blood Lactic Acid: 2.8 mmol/L — ABNORMAL HIGH (ref 0.4–2.0)
Whole Blood Lactic Acid: 4.4 mmol/L (ref 0.4–2.0)

## 2024-10-04 MED ORDER — SODIUM CHLORIDE 0.9 % IV MBP
1.0000 g | INTRAVENOUS | Status: DC
  Filled 2024-10-04: qty 1000

## 2024-10-04 MED ORDER — ALUM & MAG HYDROXIDE-SIMETH 200-200-20 MG/5ML PO SUSP: 15.0000 mL | ORAL | Status: DC | PRN

## 2024-10-04 MED ORDER — ASPIRIN 81 MG PO CHEW
81.0000 mg | CHEWABLE_TABLET | Freq: Every day | ORAL | Status: DC
  Administered 2024-10-05 – 2024-10-06 (×2): 81 mg via ORAL
  Filled 2024-10-04 (×2): qty 1

## 2024-10-04 MED ORDER — HYDRALAZINE HCL 20 MG/ML IJ SOLN: 10.0000 mg | INTRAMUSCULAR | Status: DC | PRN

## 2024-10-04 MED ORDER — METHYLPREDNISOLONE SODIUM SUCC 40 MG IJ SOLR (WRAP)
40.0000 mg | Freq: Three times a day (TID) | INTRAMUSCULAR | Status: DC
  Administered 2024-10-04 – 2024-10-06 (×5): 40 mg via INTRAVENOUS
  Filled 2024-10-04 (×5): qty 1

## 2024-10-04 MED ORDER — OXYCODONE HCL 5 MG PO TABS: 5.0000 mg | ORAL_TABLET | ORAL | Status: DC | PRN

## 2024-10-04 MED ORDER — CALCIUM CARBONATE ANTACID 500 MG PO CHEW: 1000.0000 mg | CHEWABLE_TABLET | Freq: Three times a day (TID) | ORAL | Status: DC | PRN

## 2024-10-04 MED ORDER — STERILE WATER FOR INJECTION IJ/IV SOLN (WRAP)
2.0000 g | Freq: Once | Status: AC
  Administered 2024-10-04: 2 g via INTRAVENOUS
  Filled 2024-10-04: qty 2

## 2024-10-04 MED ORDER — PANTOPRAZOLE SODIUM 40 MG PO TBEC
40.0000 mg | DELAYED_RELEASE_TABLET | Freq: Every morning | ORAL | Status: DC
  Administered 2024-10-05 – 2024-10-06 (×2): 40 mg via ORAL
  Filled 2024-10-04 (×2): qty 1

## 2024-10-04 MED ORDER — UMECLIDINIUM BROMIDE 62.5 MCG/ACT IN AEPB
1.0000 | INHALATION_SPRAY | Freq: Every morning | RESPIRATORY_TRACT | Status: DC
  Administered 2024-10-06: 1 via RESPIRATORY_TRACT
  Filled 2024-10-04: qty 7

## 2024-10-04 MED ORDER — FERROUS SULFATE 324 (65 FE) MG PO TBEC: 324.0000 mg | DELAYED_RELEASE_TABLET | Freq: Every morning | ORAL | Status: DC

## 2024-10-04 MED ORDER — ONDANSETRON HCL 4 MG/2ML IJ SOLN: 4.0000 mg | INTRAMUSCULAR | Status: DC | PRN

## 2024-10-04 MED ORDER — DEXTROSE 10 % IV BOLUS: 12.5000 g | INTRAVENOUS | Status: DC | PRN

## 2024-10-04 MED ORDER — ASPIRIN 300 MG RE SUPP: 300.0000 mg | Freq: Every day | RECTAL | Status: DC

## 2024-10-04 MED ORDER — HEPARIN SODIUM (PORCINE) 5000 UNIT/ML IJ SOLN
5000.0000 [IU] | Freq: Two times a day (BID) | INTRAMUSCULAR | Status: DC
  Administered 2024-10-04 – 2024-10-06 (×4): 5000 [IU] via SUBCUTANEOUS
  Filled 2024-10-04 (×4): qty 1

## 2024-10-04 MED ORDER — ATORVASTATIN CALCIUM 10 MG PO TABS
10.0000 mg | ORAL_TABLET | Freq: Every day | ORAL | Status: DC
  Administered 2024-10-05 – 2024-10-06 (×2): 10 mg via ORAL
  Filled 2024-10-04 (×2): qty 1

## 2024-10-04 MED ORDER — FLUTICASONE PROPIONATE 50 MCG/ACT NA SUSP
2.0000 | Freq: Every day | NASAL | Status: DC
  Administered 2024-10-05 – 2024-10-06 (×2): 2 via NASAL
  Filled 2024-10-04: qty 16

## 2024-10-04 MED ORDER — LABETALOL HCL 5 MG/ML IV SOLN (WRAP)
10.0000 mg | INTRAVENOUS | Status: DC | PRN
  Administered 2024-10-06: 10 mg via INTRAVENOUS
  Filled 2024-10-04: qty 4

## 2024-10-04 MED ORDER — HYDRALAZINE HCL 25 MG PO TABS
25.0000 mg | ORAL_TABLET | Freq: Two times a day (BID) | ORAL | Status: DC
  Administered 2024-10-05 – 2024-10-06 (×4): 25 mg via ORAL
  Filled 2024-10-04 (×4): qty 1

## 2024-10-04 MED ORDER — SEVELAMER CARBONATE 800 MG PO TABS
1600.0000 mg | ORAL_TABLET | Freq: Three times a day (TID) | ORAL | Status: DC
  Administered 2024-10-05 – 2024-10-06 (×5): 1600 mg via ORAL
  Filled 2024-10-04 (×5): qty 2

## 2024-10-04 MED ORDER — SALINE SPRAY 0.65 % NA SOLN
2.0000 | NASAL | Status: DC | PRN
  Filled 2024-10-04: qty 44

## 2024-10-04 MED ORDER — ACETAMINOPHEN 500 MG PO TABS: 500.0000 mg | ORAL_TABLET | ORAL | Status: DC | PRN

## 2024-10-04 MED ORDER — GLUCAGON 1 MG IJ SOLR (WRAP): 1.0000 mg | INTRAMUSCULAR | Status: DC | PRN

## 2024-10-04 MED ORDER — ALBUTEROL-IPRATROPIUM 2.5-0.5 (3) MG/3ML IN SOLN
3.0000 mL | Freq: Four times a day (QID) | RESPIRATORY_TRACT | Status: DC | PRN
  Administered 2024-10-06: 3 mL via RESPIRATORY_TRACT
  Filled 2024-10-04: qty 3

## 2024-10-04 MED ORDER — ACETAMINOPHEN 650 MG RE SUPP
650.0000 mg | RECTAL | Status: DC | PRN
  Administered 2024-10-04: 650 mg via RECTAL
  Filled 2024-10-04: qty 1

## 2024-10-04 MED ORDER — ASPIRIN 325 MG PO TBEC: 325.0000 mg | DELAYED_RELEASE_TABLET | Freq: Every day | ORAL | Status: DC

## 2024-10-04 MED ORDER — INSULIN LISPRO 100 UNIT/ML SOLN (WRAP)
1.0000 [IU] | Freq: Every evening | Status: DC
  Administered 2024-10-05: 2 [IU] via SUBCUTANEOUS
  Filled 2024-10-04: qty 6

## 2024-10-04 MED ORDER — BISOPROLOL FUMARATE 5 MG PO TABS: 5.0000 mg | ORAL_TABLET | Freq: Every day | ORAL | Status: DC

## 2024-10-04 MED ORDER — VANCOMYCIN PHARMACY TO DOSE PLACEHOLDER: INTRAVENOUS | Status: DC

## 2024-10-04 MED ORDER — RISAQUAD PO CAPS
1.0000 | ORAL_CAPSULE | Freq: Every day | ORAL | Status: DC
  Administered 2024-10-05 – 2024-10-06 (×2): 1 via ORAL
  Filled 2024-10-04 (×2): qty 1

## 2024-10-04 MED ORDER — IOHEXOL 350 MG/ML IV SOLN
85.0000 mL | Freq: Once | INTRAVENOUS | Status: AC | PRN
  Administered 2024-10-04: 85 mL via INTRAVENOUS
  Filled 2024-10-04: qty 100

## 2024-10-04 MED ORDER — FLUTICASONE FUROATE-VILANTEROL 100-25 MCG/ACT IN AEPB
1.0000 | INHALATION_SPRAY | Freq: Every morning | RESPIRATORY_TRACT | Status: DC
  Administered 2024-10-06: 1 via RESPIRATORY_TRACT
  Filled 2024-10-04: qty 14

## 2024-10-04 MED ORDER — DEXTROSE 50 % IV SOLN: 12.5000 g | INTRAVENOUS | Status: DC | PRN

## 2024-10-04 MED ORDER — INSULIN LISPRO 100 UNIT/ML SOLN (WRAP)
1.0000 [IU] | Freq: Three times a day (TID) | Status: DC
  Administered 2024-10-05 (×2): 3 [IU] via SUBCUTANEOUS
  Administered 2024-10-05 – 2024-10-06 (×2): 4 [IU] via SUBCUTANEOUS
  Administered 2024-10-06: 5 [IU] via SUBCUTANEOUS
  Administered 2024-10-06: 1 [IU] via SUBCUTANEOUS
  Filled 2024-10-04: qty 3
  Filled 2024-10-04: qty 12
  Filled 2024-10-04 (×2): qty 9
  Filled 2024-10-04: qty 6
  Filled 2024-10-04: qty 12

## 2024-10-04 MED ORDER — DOCUSATE SODIUM 100 MG PO CAPS: 100.0000 mg | ORAL_CAPSULE | Freq: Two times a day (BID) | ORAL | Status: DC

## 2024-10-04 MED ORDER — MONTELUKAST SODIUM 10 MG PO TABS
10.0000 mg | ORAL_TABLET | Freq: Every evening | ORAL | Status: DC
  Administered 2024-10-05: 10 mg via ORAL
  Filled 2024-10-04: qty 1

## 2024-10-04 MED ORDER — CARVEDILOL 3.125 MG PO TABS
3.1250 mg | ORAL_TABLET | Freq: Every day | ORAL | Status: DC
  Administered 2024-10-05 – 2024-10-06 (×2): 3.125 mg via ORAL
  Filled 2024-10-04 (×2): qty 1

## 2024-10-04 MED ORDER — VANCOMYCIN HCL IN NACL 1.5-0.9 GM/500ML-% IV SOLN
20.0000 mg/kg | Freq: Once | INTRAVENOUS | Status: AC
  Administered 2024-10-04: 1500 mg via INTRAVENOUS
  Filled 2024-10-04: qty 500

## 2024-10-04 MED ORDER — MELATONIN 3 MG PO TABS: 3.0000 mg | ORAL_TABLET | Freq: Every evening | ORAL | Status: DC | PRN

## 2024-10-04 MED ORDER — SODIUM CHLORIDE 0.9 % IV SOLN
INTRAVENOUS | Status: AC
  Filled 2024-10-04: qty 1000

## 2024-10-04 MED ORDER — GLUCOSE 40 % PO GEL (WRAP): 15.0000 g | ORAL | Status: DC | PRN

## 2024-10-04 MED ORDER — VITAMIN D 25 MCG (1000 UT) PO TABS: 50.0000 ug | ORAL_TABLET | Freq: Every day | ORAL | Status: DC

## 2024-10-04 MED ORDER — BENZOCAINE-MENTHOL MT LOZG (WRAP): 1.0000 | LOZENGE | OROMUCOSAL | Status: DC | PRN

## 2024-10-04 NOTE — Progress Notes (Signed)
 [facility] Department of Pharmacy Vancomycin  Dosing Consult:  Dialysis Patients  Patient: Jacqueline Weber   Room/Bed: 03/03    Assessment   Vancomycin  indication: sepsis, unclear etiology  Vancomycin  target pre-HD level: 15-25 mg/L    Patient weight: 73.2 kg (161 lb 6 oz)   Renal function: on HD   Recent Labs     10/04/24  1654   WBC 15.01*   BUN 13   Creatinine 2.9*       No results for input(s): VANCO in the last 72 hours.    Date 10/04/24        HD day? Yes (pta today)        Vancomycin  level         Dose given 1500 mg (loading dose in ED)              Pharmacy Plan     Patient received vancomycin  1500 mg (~ 20 mg/kg) IV x1 today in ED (10/04/24 ~ 1800).    Was receiving HD immediately prior to admission today; further HD schedule unclear at this time.  Pharmacy will monitor for next HD, and further dosing will be determined at that time.    Pharmacy will continue to monitor drug level and HD schedule, and order vancomycin  doses and labs as necessary.  Please contact the pharmacy with any questions at [phone] or Epic Secure Chat group [facility].    Olam BIRCH Nezar Buckles, RPh

## 2024-10-04 NOTE — H&P (Signed)
 [facility]  Internal Medicine Hospitalists  History and Physical        Assessment / Plan:        Jacqueline Weber is a 74 y.o. female with altered mental status    # Sepsis with fever tachycardia leukocytosis WBC 15 associated with elevated lactic acid initial lactic acid 4.4 repeat lactic acid 2.8 will not give bolus IV fluid due to underlying end-stage renal disease gentle IV hydration and repeat lactic acid  Unclear source COVID-19 flu negative chest x-ray no infiltrate however patient has some crackles noted possible aspiration  Broad-spectrum antibiotic vancomycin  and cefepime   ID consult left message for Dr. 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  Nephrology consulted from ED               VTE Prophylaxis: heparin  (porcine) injection 5,000 Units  Place sequential compression device     Foley Catheter: No Foley Present        Medical Readiness for Discharge: Anticipated in 2-4 Days    Open Handoff Activity in Sidebar    Additional Diagnoses:     Patient has a BMI of 31.52 kg/m2    Class 1 Obesity: BMI of 30 to 34.9     Recent Labs   Lab 10/04/24  1654   Hemoglobin 9.7*   Hematocrit 32.0*   MCV 71.7*   WBC 15.01*   Platelet Count 158         Anemia Diagnosis:  Chronic: Chronic Anemia, Unspecified       HPI:        74 year old female speaks Laotian.  Patient's son at bedside speaks English source of history.  Patient was on HD earlier today suddenly became confused.  Also had altered mental status with initial GCS of 8 and patient was on nonrebreather later on switched to nasal cannula oxygen found to have febrile tachycardic.  Got broad-spectrum antibiotics in ED.  Stroke neurology was involved due to sudden altered mental status unlikely stroke as per neurology likely secondary to underlying sepsis.  Patient is still confused unable to give detailed history.         Past Medical / Surgical / Family / Social History:     Medical History[1]  Past Surgical History[2]   Family History[3]  Social History[4]    Allergies and Home Medications:     Allergies[5]  Home Medications       Med List Status: In Progress Set By: Marget Gee, RN at 10/04/2024  4:44 PM              ADVAIR  DISKUS 250-50 MCG/DOSE Aerosol Powder, Breath Activtivatede     INHALE 1 DOSE BY MOUTH TWICE DAILY     aspirin  EC 325 MG EC tablet     Take 1 tablet (325 mg  total) by mouth daily.     bisoprolol  (ZEBETA ) 5 MG tablet     Take 5 mg by mouth daily     carvedilol  (COREG ) 3.125 MG tablet     Take 3.125 mg by mouth daily.         Cholecalciferol  (VITAMIN D3) 2000 units Tab     Take 4,000 IU by mouth daily.     ferrous sulfate  324 (65 FE) MG Tablet Delayed Response     TAKE 1 TABLET BY MOUTH IN THE MORNING WITH BREAKFAST     fluticasone  (FLONASE ) 50 MCG/ACT nasal spray     Use 2 spray(s) in each nostril once daily     glipiZIDE  (GLUCOTROL ) 10 MG tablet     TAKE 1 TABLET BY MOUTH TWICE DAILY BEFORE MEAL(S)     hydrALAZINE  (APRESOLINE ) 25 MG tablet     Take 25 mg by mouth 2 (two) times daily     insulin  glargine (BASAGLAR  KWIKPEN) 100 UNIT/ML injection pen     Inject 10 Units into the skin nightly     Insulin  Pen Needle (B-D UF III MINI PEN NEEDLES) 31G X 5 MM Misc     Inject insulin  nightly as instructed      montelukast  (SINGULAIR ) 10 MG tablet     as needed.     Olopatadine  HCl 0.2 % Solution     INSTILL 1 DROP INTO AFFECTED EYE ONCE DAILY     ONE TOUCH ULTRA TEST test strip     Check blood sugar 2 times daily     ONETOUCH DELICA LANCETS 33G Misc     Check BS BID     pantoprazole  (PROTONIX ) 40 MG tablet     TAKE 1 TABLET BY MOUTH IN THE MORNING BEFORE BREAKFAST     RENVELA  800 MG tablet     1,600 mg 3 (three) times daily with meals.         simvastatin  (ZOCOR ) 20 MG tablet     Take 20 mg by mouth nightly.         Umeclidinium Bromide  (INCRUSE ELLIPTA  IN)     Inhale into the lungs daily                Review of Systems:   Unable to get due to altered mental state    Objective:      Patient Vitals for the past 24 hrs:   BP Temp Temp src Pulse Resp SpO2 Weight   10/04/24 2100 166/66 -- -- (!) 113 (!) 24 97 % --   10/04/24 2030 166/65 -- -- (!) 107 (!) 27 96 % --   10/04/24 1930 172/63 -- -- (!) 106 (!) 23 96 % --   10/04/24 1918 179/65 (!) 101.7 F (38.7 C) Oral (!) 110 (!) 24 95 % --   10/04/24 1745 -- -- -- (!) 112 (!) 25 93 % --   10/04/24 1740 -- -- -- (!) 114 (!) 24 94 % --   10/04/24 1734 -- -- -- (!) 112 (!) 29 95 % --   10/04/24 1729 161/58 -- -- (!) 114 (!) 29 94 % --   10/04/24 1724 -- -- -- (!) 113 (!) 28 95 % --   10/04/24 1719 -- -- -- (!) 117 (!) 30 93 % --   10/04/24 1714 -- -- -- (!) 118 (!) 26 94 % --   10/04/24 1709 -- -- -- (!) 115 (!) 29 94 % --   10/04/24 1704 -- -- -- MAGNUS  115 (!) 24 95 % --   10/04/24 1700 186/67 -- -- -- -- -- --   10/04/24 1659 -- -- -- (!) 115 (!) 26 95 % --   10/04/24 1659 -- -- -- -- (!) 24 -- --   10/04/24 1655 -- -- -- (!) 116 (!) 29 95 % --   10/04/24 1650 -- -- -- (!) 120 (!) 40 94 % --   10/04/24 1637 (!) 181/111 -- -- -- -- 95 % --   10/04/24 1635 -- -- -- -- -- 95 % --   10/04/24 1633 (!) 181/111 98.3 F (36.8 C) Temporal (!) 126 -- 95 % 73.2 kg (161 lb 6 oz)   10/04/24 1630 -- -- -- -- -- 97 % --   10/04/24 1625 -- -- -- -- -- 97 % --        Physical Exam  Vitals  reviewed.   Constitutional:       Appearance: She is ill-appearing.   HENT:      Head: Normocephalic and atraumatic.      Nose: Nose normal.      Mouth/Throat:      Mouth: Mucous membranes are moist.      Pharynx: Oropharynx is clear.   Eyes:      Extraocular Movements: Extraocular movements intact.      Conjunctiva/sclera: Conjunctivae normal.      Pupils: Pupils are equal, round, and reactive to light.   Cardiovascular:      Rate and Rhythm: Normal rate.      Pulses: Normal pulses.      Heart sounds: Normal heart sounds.   Pulmonary:      Effort: Pulmonary effort is normal.      Breath sounds: Rales present.   Abdominal:      General: Abdomen is flat. Bowel sounds are normal.      Palpations: Abdomen is soft.   Musculoskeletal:         General: Normal range of motion.      Cervical back: Normal range of motion and neck supple.   Skin:     General: Skin is warm.      Capillary Refill: Capillary refill takes less than 2 seconds.   Neurological:      Mental Status: She is alert. She is disoriented.   Psychiatric:      Comments: Unable to assess due to altered mental status                             [1]   Past Medical History:  Diagnosis Date    Abnormal result of cardiovascular function study, unspecified 10/28/2015    Allergic rhinitis due to pollen 01/25/2021    Anemia 10/02/2016    H/O ANEMIA - REFLECTED ON CURRENT LABS.    Aortic valve insufficiency 03/21/2017    Asthma NA    Atherosclerotic heart disease of native coronary artery with unspecified angina pectoris 04/10/2016    Bronchiectasis (CMS/HCC) 06/21/2017    Carotid bruit 11/18/2015    Cataracts, bilateral     H/O CATARACTS - SURGERY DONE - STILL HAS SOME ISSUES.    Cerebrovascular accident (CMS/HCC) 2014    H/O STROKE - WEAKNESS ON LEFT SIDE. NO NEUROLOGIST. USES CANE FOR BALANCE.    Cerebrovascular disease 04/24/2017    Chronic asthmatic bronchitis (CMS/HCC) 12/01/2020    Never smoker but exp to wood burning indoor fires in [place]   - 11/30/2020  After  extensive coaching inhaler device,  effectiveness =      0% so changed to neb bud/ performist  Last Assessment & Plan:    Never smoker but exp to wood burning indoor fires in [place]   - 11/30/2020  After extensive coaching inhaler device,  effectiveness =      0% so changed to neb bud/ performist trial basis - if can't aff    Chronic cough 11/30/2020    Broncheictasis on CT chest 11/25/2018   - 11/30/2020 trial of bud/performist bid for AB component plus max rx for GERD    - Sinus CT  12/15/20   Extensive pansinusitis> refer to ENT      Chronic renal failure, stage 5 (CMS/HCC) 05/08/2017    Chronic sinusitis 12/21/2020    Constipation     Coronary artery disease 06/2016    H/O QUADRUPLE BYPASS SURGERY - FOLLOWED BY [facility].    Diabetes mellitus (CMS/HCC)     Diabetic peripheral neuropathy (CMS/HCC) 06/18/2023    Difficulty walking     WALKS WITH CANE. WILL USE WC IN HOSPITAL    End-stage renal disease (CMS/HCC) 04/2017    DIALYSIS [facility] MON-WED-FRI. 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    Hyperlipidemia 03/11/2013    Hypertension     CONTROLLED WITH MEDS.    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 CURLY.    Shortness of breath     MODERATE EXERTION     Stage 5 chronic kidney disease on chronic dialysis (CMS/HCC) 08/21/2019    M,W,F  - Dr. Lenward      Type 2 diabetes mellitus with diabetic chronic kidney disease (CMS/HCC) 04/24/2017    Type 2 diabetes mellitus, controlled (CMS/HCC)     AM FSBS 160-170. INSTRUCTED TO CONTROL DIET CLOSELY TONIGHT.  HAD BEEN OFF JANUVIA , RESTARTED 06/15/17. HA1C 9 06/15/17. MANAGED BY PCP.     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) 06/19/2023   [2]   Past Surgical History:  Procedure Laterality Date    BRONCHOSCOPY, FIBEROPTIC N/A 06/21/2017    Procedure: BRONCHOSCOPY with brushing and wash;  Surgeon: Fernand Arlyn Lazier, MD;  Location: [facility];  Service: Pulmonary;  Laterality: N/A;    BRONCHOSCOPY, FIBEROPTIC N/A 05/22/2019    Procedure: BRONCHOSCOPY w/ wash & brush;  Surgeon: Fernand Arlyn DEL, MD;  Location: [facility];  Service: Pulmonary;  Laterality: N/A;    CORONARY ARTERY BYPASS N/A 06/12/2016    Procedure: Coronary Artery Bypass x 4.;  Surgeon: Clent Camellia CROME, MD;  Location: [facility];  Service: Cardiothoracic;  Laterality: N/A;  LIMA to LDA  Vein to PDA  Vein to OM  Vein to Diagonal  EGD, COLONOSCOPY N/A 10/04/2016    EGD, COLONOSCOPY N/A 10/04/2016    Procedure: EGD w/ bx, COLONOSCOPY w/ bx, polypectomy;  Surgeon: Theador Colin RAMAN, MD;  Location: [facility];  Service: Gastroenterology;  Laterality: N/A;    ENDOSCOPIC,VEIN HARVEST N/A 06/12/2016    Procedure: Endoscopic, Left Leg Greater Saphenous Vein Harvest;  Surgeon: Clent Camellia CROME, MD;  Location: [facility];  Service: Cardiothoracic;  Laterality: N/A;  Left Leg Incision (medially, groin to ankle) @ 0742  Vein out of leg @ 0826  Vein prep complete @ 0845  Total time = 63 minutes    FORMATION, ARTERIOVENOUS FISTULA, UPPER EXTREMITY Left 05/31/2017    Procedure: FORMATION, UPPER EXTREMITY, A-V FISTULA;  Surgeon: Aurelio Arvis LABOR, MD;  Location: [facility];  Service: Vascular;  Laterality: Left;  LEFT ARM A-V FISTULA      REVISION, A-V FISTULA UPPER EXTREMITY Left 07/16/2017    Procedure:  REVISION, A-V FISTULA UPPER EXTREMITY WITH PATCH ANGIOPLASTY;  Surgeon: Aurelio Arvis LABOR, MD;  Location: [facility];  Service: Vascular;  Laterality: Left;   [3]   Family History  Problem Relation Name Age of Onset    No known problems Mother      No known problems Father     [4]   Social History  Tobacco Use    Smoking status: Never    Smokeless tobacco: Never   Vaping Use    Vaping status: Never Used   Substance Use Topics    Alcohol  use: No    Drug use: No   [5]   Allergies  Allergen Reactions    Mosquito (Culex Pipiens) Allergy Skin Test Shortness Of Breath     And  Trouble  breathing    Shellfish Allergy Hives    Shellfish Protein-Containing Drug Products Hives     New  allergy  New  allergy    Lisinopril  Hives and Rash     Patient cannot remember the exact nature of the allergy    Penicillins Rash and Swelling     numbness  Swelling,  numbness

## 2024-10-04 NOTE — ED Provider Notes (Signed)
 [facility]  Emergency Department Physician Note      Diagnosis/Disposition     ED Disposition:  Data Unavailable    ED Diagnosis:  Data Unavailable    New Prescriptions    No medications on file     History of Present Illness   {Disappearing Text  Use the Action Links below to navigate to specific areas of the chart and the History Hover Bubbles to view history.   Links  Snapshot    Decision Support    Care Everywhere  History  Medical History[1] Past Surgical History[2] Social History[3] Family History[4] Allergies[5] Medications Ordered Prior to Encounter[6]               :[id number]}   History of Present Illness  Jacqueline Weber is a 74 year old female who presents with altered mental status.    She experienced altered mental status while undergoing dialysis, initially having increased blood pressure followed by a decrease, coinciding with the change in mental status. Upon arrival of emergency services, her oxygen saturation was in the eighties on room air, improving to 100% on a non-rebreather mask. She remained minimally responsive with a Glasgow Coma Scale score of eight during transport, though there was slight improvement in alertness before reaching the emergency department.    Upon arrival at the ER, she was sitting with her eyes open and exhibited spontaneous movement of all four extremities but did not respond to commands or the interpreter. During the initial evaluation, she appeared to vomit and was given a emesis bag, into which she vomited somewhat, but she swallowed a large portion of the contents despite instructions to avoid doing so.    A faint left-sided facial droop was noted.      Chronic illness impacting care and increasing risk of presenting acute or chronic problems (obesity as defined as BMI>30, diabetes, hypertension, cad, elderly as defined as age 44 years or older):  has a past medical history of Anemia (10/02/2016), Asthma (NA), Cataracts, bilateral,  Cerebrovascular accident (CMS/HCC) (2014), Chronic renal failure, stage 5 (CMS/HCC) (05/08/2017), Constipation, Coronary artery disease (06/2016), Diabetes mellitus (CMS/HCC), Difficulty walking, End-stage renal disease (CMS/HCC) (04/2017), Gastroesophageal reflux disease, History of MI (myocardial infarction), 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     Number and Complexity of Presenting Problem(s) Addressed, Complexity: {Acuity:16000030::High: 1 acute or chronic illness or injury that poses a threat to life or bodily function.}    Physical Exam     Pulse (!) 126  BP (!) 181/111  Resp (!) 24  SpO2 95 %  Temp 98.3 F (36.8 C)    Physical Exam  Vitals and nursing note reviewed.   Constitutional:       General: She is not in acute distress.     Appearance: She is not toxic-appearing.      Comments: Somewhat ill-appearing but in no acute distress.  Inattentive.  Not following commands.  Eyes open and spontaneous movement of all 4 extremities noted.   HENT:      Head: Normocephalic and atraumatic.      Mouth/Throat:      Mouth: Mucous membranes are moist.      Pharynx: Oropharynx is clear.   Eyes:      Comments: Sluggish reaction to light bilateral.  Pupils reactive.   Neck:      Comments: No meningeal signs  Cardiovascular:  Rate and Rhythm: Regular rhythm. Tachycardia present.      Pulses: Normal pulses.   Pulmonary:      Effort: Pulmonary effort is normal.      Breath sounds: Rales present.      Comments: Labored respirations and tachypnea.  Mild respiratory distress  Abdominal:      General: There is no distension.   Musculoskeletal:      Cervical back: Normal range of motion and neck supple.   Skin:     General: Skin is warm and dry.      Findings: No rash.   Neurological:      Mental Status: She is alert.       Comments: Awake and alert.  Spontaneous movement of all 4 extremities noted.  Not following commands.  Inattentive.           Medical Decision Making   {Disappearing Text  Use the Action and Data Review Links below to navigate to specific areas of the chart.  Action Links  Snapshot    Orders    Decision Support    Secure Chat    Transfer Documents    Patient Care Timeline    Workup    Dispo  Data Review Links Results Review    EKG    Labs    Imaging    Media              :[id number]}     Medical Decision Making  74 year old female with end-stage renal disease on dialysis presented with acute altered mental status during dialysis, characterized by initial hypertension followed by hypotension and hypoxemia, with a Glasgow Coma Scale of 8 en route and minimal responsiveness on arrival. Exam revealed spontaneous movement of all extremities, lack of response to commands or interpreter, and a faint left-sided facial droop. Vomiting and concern for aspiration were noted during evaluation. A portion of the history was obtained from the dialysis team as independent historians.    Differential diagnosis includes, but is not limited to:  - Intracranial pathology (including intracranial hemorrhage): Acute change in mental status during dialysis raises concern for intracranial pathology, including hemorrhage, as a possible etiology.  - Acute stroke: Faint left-sided facial droop and acute altered mental status are concerning for possible acute stroke; time of last known well is unknown and NIH Stroke Scale score is not documented.  - Electrolyte abnormalities: Electrolyte disturbances are possible given end-stage renal disease and dialysis, which may contribute to altered mental status.  - Cardiac event: Blood pressure fluctuations and acute mental status change during dialysis raise concern for a cardiac event as a potential cause.  - Dehydration: Dehydration is considered as a contributing factor to the acute  presentation during dialysis.  - Infection: Infection is considered, particularly in the context of possible aspiration and altered mental status.  Meningitis and encephalitis in differential.  However patient is afebrile.  Acute onset of symptoms also not wholly consistent with this.  - Seizure: Seizure is included in the differential due to the acute change in mental status.  - Hypoxia: Hypoxemia was documented on arrival, which may have contributed to the altered mental status.  - Hypercarbia: Hypercarbia is considered as a possible cause of altered mental status in this clinical context.    Altered mental status with concern for intracranial pathology, stroke, metabolic derangement, infection, and aspiration  - Order stroke workup, arterial blood gas, septic workup, and assessment of electrolytes  - Administered empiric antibiotics for concern of aspiration  Differential diagnosis to include but not limited to: as above    ED Course as of 10/04/24 1743   Sat Oct 04, 2024   1650 Spoke with Dr. Josepha, tele neuro.  Reviewed patient CT.  No acute intracranial process noted.  Although patient does have abrupt altered mental status, elevated NIHSS and the nasolabial flattening possibly on exam, diagnosis is confounded by patient's cooperation and unclear baseline.  Multiple other diagnoses and differential. [ST]   1706 Sees [place] [facility], will reach out to nephrologist. [ST]   1706 Empiric antibiotics ordered.  Has allergy penicillin.  Has received cephalosporins in the past.  Will hold fluids given concern for volume overload.  Patient not hypotensive. [ST]   1735 Of note, pupillometer shows a minimally reactive right pupil.  On my exam, I did notice sluggish light response on the right but equal pupils bilateral otherwise.  Information relayed to neurology.  As far as we can tell, last known well is approximately 1530 based on patient being at her baseline on arrival to dialysis and while receiving  dialysis until the event that EMS was called for.  Both neurologist and I continue to have clinical gestalt that this is not an acute CVA [ST]   1742 Awaiting callback from nephrology to confirm we can get dialysis for the patient within 24 hours from receiving contrast for CTA [ST]      ED Course User Index  [ST] Vana Elspeth POUR, DO        NIH Stroke Scale/Score (NIHSS) from [email or web address] on 10/04/2024  ** All calculations should be rechecked by clinician prior to use **    RESULT SUMMARY:  16 points  NIH Stroke Scale      INPUTS:  1A: Level of consciousness --> 1 = Arouses to minor stimulation  1B: Ask month and age --> 2 = Aphasic  1C: 'Blink eyes' & 'squeeze hands' --> 1 = Performs 1 task  2: Horizontal extraocular movements --> 0 = Normal  3: Visual fields --> 0 = No visual loss  4: Facial palsy --> 1 = Minor paralysis (flat nasolabial fold, smile asymmetry)  5A: Left arm motor drift --> 1 = Drift, but doesn't hit bed  5B: Right arm motor drift --> 0 = No drift for 10 seconds  6A: Left leg motor drift --> 2 = Some effort against gravity  6B: Right leg motor drift --> 2 = Some effort against gravity  7: Limb Ataxia --> 0 = Does not understand  8: Sensation --> 0 = Normal; no sensory loss  9: Language/aphasia --> 3 = Mute/global aphasia: no usable speech/auditory comprehension  10: Dysarthria --> 2 = Mute/anarthric  11: Extinction/inattention --> 1 = Visual/tactile/auditory/spatial/personal inattention    Of note, NIHSS is complicated by patient being altered and not following commands.         Procedures   Procedures        Amount/complexity of Data Reviewed   L\  Look below for information    History obtained from another historian (parent, spouse, caregiver, ems) : See HPI    Review of older external records from Nursing notes.     Independent interpretation of  EKG:     EKG Interpretation  EKG interpreted independently by me:  Time:   Rate: 126  Interpretation: Sinus tachycardia with PACs.  ST and T wave  abnormality inferolateral leads.  Normal axis.  No STEMI.  Abnormal EKG.  EKG interpreted by me.  Independent visualized and interpretation of radiological study by me:     Type of radiological study performed : Chest x-ray  Independent Interpretation by me: Consolidation to right lower lobe    Diagnostic tests appropriately considered even if not ultimately performed: Labs, x-ray, CT, CTA, MRI, echo    Discussion of management with other physicians and/or other caretakers:   Discussion and recommendation with  Neurology, Dr. Josepha. Patient condition and all pertinent labs and/or radiology studies discussed.       Supplemental Encounter Data   Medical History[7]  Past Surgical History[8]  Social History[9]  Family History[10]  Allergies[11]    Encounter Orders:  Orders Placed This Encounter   Procedures    Culture, Blood, Aerobic And Anaerobic    Culture, Blood, Aerobic And Anaerobic    Nares, MRSA (methicillin-resistant Staphylococcus aureus) Screening, PCR    CT Head WO Contrast    Chest  AP Portable    CTA Head & Neck    CBC with Differential (Order)    Comprehensive Metabolic Panel    PT/APTT    High Sensitivity Troponin-I    Urinalysis with Reflex to Microscopic Exam and Culture    Urine Elnor Culture Hold Tube    Lactic Acid    Magnesium     CBC with Differential (Component)    Arterial Blood Gas    Cardiac monitoring (ED ONLY)    Continuous Pulse Oximetry    Dysphagia Screen    NPO (Including Aspirin ) until dysphagia screen done and passed    Nursing Stroke Protocol and NIHSS    Pupillometer Check    do NOT wait for creatinine/GFR prior to IV contrast administration for acute stroke neuroimaging    Glucose POC    Weigh patient    POCT Arterial Blood Gas (ABG)    ECG 12 lead    ECG 12 lead    Insert peripheral IV    Saline lock IV     Medications Administered:  Medications   cefepime  (MAXIPIME ) 2 g in sterile water  (preservative free) 10 mL IV push injection (has no administration in time range)    vancomycin  (VANCOCIN ) 1500 mg in sodium chloride  0.9% 500 mL (premix) (has no administration in time range)     Laboratory and Imaging Studies:  Results for orders placed or performed during the hospital encounter of 10/04/24 (from the past 24 hours)   Comprehensive Metabolic Panel    Collection Time: 10/04/24  4:54 PM   Result Value    Glucose 183 (H)    BUN 13    Creatinine 2.9 (H)    Sodium 137    Potassium 4.1    Chloride 93 (L)    CO2 29    Calcium  9.5    Anion Gap 15.0    GFR 16.5 (L)    AST (SGOT) 26    ALT 9    Alkaline Phosphatase 209 (H)    Albumin  3.7    Protein, Total 8.8 (H)    Globulin 5.1 (H)    Albumin /Globulin Ratio 0.7 (L)    Bilirubin, Total 0.6   PT/APTT    Collection Time: 10/04/24  4:54 PM   Result Value    PT 12.0    INR 1.0    PTT 34   High Sensitivity Troponin-I    Collection Time: 10/04/24  4:54 PM   Result Value    hs Troponin 8.6   Lactic Acid    Collection Time: 10/04/24  4:54 PM  Result Value    Whole Blood Lactic Acid 4.4 (HH)    Collection Time: 10/04/24  4:54 PM   Result Value    Magnesium  1.7   CBC with Differential (Component)    Collection Time: 10/04/24  4:54 PM   Result Value    WBC 15.01 (H)    Hemoglobin 9.7 (L)    Hematocrit 32.0 (L)    Platelet Count 158    MPV     RBC 4.46    MCV 71.7 (L)    MCH 21.7 (L)    MCHC 30.3 (L)    RDW 19 (H)    nRBC % 0.0    Absolute nRBC 0.00    Preliminary Absolute Neutrophil Count 12.14 (H)    Neutrophils % 80.8    Lymphocytes % 11.6    Monocytes % 6.0    Eosinophils % 0.9    Basophils % 0.3    Immature Granulocytes % 0.4    Absolute Neutrophils 12.14 (H)    Absolute Lymphocytes 1.74    Absolute Monocytes 0.90 (H)    Absolute Eosinophils 0.13    Absolute Basophils 0.04    Absolute Immature Granulocytes 0.06   Arterial Blood Gas    Collection Time: 10/04/24  5:05 PM   Result Value    Collection Site Right Radl    Modified Allen's Test Yes    Temperature 37.0    Arterial pH 7.489 (H)    Arterial pCO2 44.3    Arterial pO2 58.9 (L)    Arterial  Base Excess 9.3 (H)    Arterial O2 Saturation 91.2 (L)    Arterial pH Corrected 7.489 (H)    Arterial pCO2 Corrected 44.3    Arterial pO2 Corrected 58.9 (L)    Arterial HCO3 33.6 (H)    Arterial CO2 35.0 (H)    Status Oxygen    O2 Delivery Nasal Cannula    O2 Liter Flow 3.0    Collection Time: 10/04/24  5:39 PM   Result Value    Whole Blood Glucose POCT 185 (H)     CT Head WO Contrast   Final Result      1. No acute intracranial hemorrhage is detected.    2. There is moderate subcortical, deep, and periventricular leukomalacia in   both cerebral hemispheres.  This is suggestive of chronic small vessel   ischemic change.       Belvie DOROTHA Lack, MD   10/04/2024 4:58 PM      Chest  AP Portable    (Results Pending)   CTA Head & Neck    (Results Pending)                     [1]   Past Medical History:  Diagnosis Date    Anemia 10/02/2016    H/O ANEMIA - REFLECTED ON CURRENT LABS.    Asthma NA    Cataracts, bilateral     H/O CATARACTS - SURGERY DONE - STILL HAS SOME ISSUES.    Cerebrovascular accident (CMS/HCC) 2014    H/O STROKE - WEAKNESS ON LEFT SIDE. NO NEUROLOGIST. USES CANE FOR BALANCE.    Chronic renal failure, stage 5 (CMS/HCC) 05/08/2017    Constipation     Coronary artery disease 06/2016    H/O QUADRUPLE BYPASS SURGERY - FOLLOWED BY [facility].    Diabetes mellitus (CMS/HCC)     Difficulty walking     WALKS WITH CANE. WILL USE WC IN HOSPITAL  End-stage renal disease (CMS/HCC) 04/2017    DIALYSIS [facility] [place] MON-WED-FRI. 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    Hypertension     CONTROLLED WITH MEDS.    Myocardial infarction (CMS/HCC) 06/2016    Osteopenia of multiple sites 10/30/2018    Pre-diabetes     Renal insufficiency     CHRONIC KIDNEY DISEASE - FOLLOWED BY DR. CURLY.    Shortness of breath     MODERATE EXERTION     Type 2 diabetes  mellitus, controlled (CMS/HCC)     AM FSBS 160-170. INSTRUCTED TO CONTROL DIET CLOSELY TONIGHT.  HAD BEEN OFF JANUVIA , RESTARTED 06/15/17. HA1C 9 06/15/17. MANAGED BY PCP.    [2]   Past Surgical History:  Procedure Laterality Date    BRONCHOSCOPY, FIBEROPTIC N/A 06/21/2017    Procedure: BRONCHOSCOPY with brushing and wash;  Surgeon: Fernand Arlyn Lazier, MD;  Location: [facility];  Service: Pulmonary;  Laterality: N/A;    BRONCHOSCOPY, FIBEROPTIC N/A 05/22/2019    Procedure: BRONCHOSCOPY w/ wash & brush;  Surgeon: Fernand Arlyn DEL, MD;  Location: [place] [facility];  Service: Pulmonary;  Laterality: N/A;    CORONARY ARTERY BYPASS N/A 06/12/2016    Procedure: Coronary Artery Bypass x 4.;  Surgeon: Clent Camellia CROME, MD;  Location: [facility];  Service: Cardiothoracic;  Laterality: N/A;  LIMA to LDA  Vein to PDA  Vein to OM  Vein to Diagonal    EGD, COLONOSCOPY N/A 10/04/2016    EGD, COLONOSCOPY N/A 10/04/2016    Procedure: EGD w/ bx, COLONOSCOPY w/ bx, polypectomy;  Surgeon: Theador Colin RAMAN, MD;  Location: [place] [facility];  Service: Gastroenterology;  Laterality: N/A;    ENDOSCOPIC,VEIN HARVEST N/A 06/12/2016    Procedure: Endoscopic, Left Leg Greater Saphenous Vein Harvest;  Surgeon: Clent Camellia CROME, MD;  Location: [facility];  Service: Cardiothoracic;  Laterality: N/A;  Left Leg Incision (medially, groin to ankle) @ 0742  Vein out of leg @ 0826  Vein prep complete @ 0845  Total time = 63 minutes    FORMATION, ARTERIOVENOUS FISTULA, UPPER EXTREMITY Left 05/31/2017    Procedure: FORMATION, UPPER EXTREMITY, A-V FISTULA;  Surgeon: Aurelio Arvis LABOR, MD;  Location: [place] [facility];  Service: Vascular;  Laterality: Left;  LEFT ARM A-V FISTULA      REVISION, A-V FISTULA UPPER EXTREMITY Left 07/16/2017    Procedure: REVISION, A-V FISTULA UPPER EXTREMITY WITH PATCH ANGIOPLASTY;  Surgeon: Aurelio Arvis LABOR, MD;  Location: [facility];  Service: Vascular;  Laterality: Left;   [3]   Social  History  Tobacco Use    Smoking status: Never    Smokeless tobacco: Never   Vaping Use    Vaping status: Never Used   Substance Use Topics    Alcohol  use: No    Drug use: No   [4]   Family History  Problem Relation Name Age of Onset    No known problems Mother      No known problems Father     [5]   Allergies  Allergen Reactions    Mosquito (Culex Pipiens) Allergy Skin Test Shortness Of Breath     And  Trouble  breathing    Shellfish Allergy Hives  Shellfish Protein-Containing Drug Products Hives     New  allergy  New  allergy    Lisinopril  Hives and Rash     Patient cannot remember the exact nature of the allergy    Penicillins Rash and Swelling     numbness  Swelling,  numbness   [6]   No current facility-administered medications on file prior to encounter.     Current Outpatient Medications on File Prior to Encounter   Medication Sig Dispense Refill    ADVAIR  DISKUS 250-50 MCG/DOSE Aerosol Powder, Breath Activtivatede INHALE 1 DOSE BY MOUTH TWICE DAILY  5    aspirin  EC 325 MG EC tablet Take 1 tablet (325 mg total) by mouth daily. 30 tablet 0    bisoprolol  (ZEBETA ) 5 MG tablet Take 5 mg by mouth daily      carvedilol  (COREG ) 3.125 MG tablet Take 3.125 mg by mouth daily.          Cholecalciferol  (VITAMIN D3) 2000 units Tab Take 4,000 IU by mouth daily.      ferrous sulfate  324 (65 FE) MG Tablet Delayed Response TAKE 1 TABLET BY MOUTH IN THE MORNING WITH BREAKFAST 90 tablet 3    fluticasone  (FLONASE ) 50 MCG/ACT nasal spray Use 2 spray(s) in each nostril once daily 16 g 5    glipiZIDE  (GLUCOTROL ) 10 MG tablet TAKE 1 TABLET BY MOUTH TWICE DAILY BEFORE MEAL(S) 180 tablet 0    hydrALAZINE  (APRESOLINE ) 25 MG tablet Take 25 mg by mouth 2 (two) times daily      insulin  glargine (BASAGLAR  KWIKPEN) 100 UNIT/ML injection pen Inject 10 Units into the skin nightly 15 mL 1    Insulin  Pen Needle (B-D UF III MINI PEN NEEDLES) 31G X 5 MM Misc Inject insulin  nightly as instructed 100 each 2    montelukast  (SINGULAIR ) 10 MG tablet  as needed.      Olopatadine  HCl 0.2 % Solution INSTILL 1 DROP INTO AFFECTED EYE ONCE DAILY 3 Bottle 3    ONE TOUCH ULTRA TEST test strip Check blood sugar 2 times daily 200 each 3    ONETOUCH DELICA LANCETS 33G Misc Check BS BID 200 each 5    pantoprazole  (PROTONIX ) 40 MG tablet TAKE 1 TABLET BY MOUTH IN THE MORNING BEFORE BREAKFAST 90 tablet 4    RENVELA  800 MG tablet 1,600 mg 3 (three) times daily with meals.          simvastatin  (ZOCOR ) 20 MG tablet Take 20 mg by mouth nightly.          Umeclidinium Bromide  (INCRUSE ELLIPTA  IN) Inhale into the lungs daily         [7]   Past Medical History:  Diagnosis Date    Anemia 10/02/2016    H/O ANEMIA - REFLECTED ON CURRENT LABS.    Asthma NA    Cataracts, bilateral     H/O CATARACTS - SURGERY DONE - STILL HAS SOME ISSUES.    Cerebrovascular accident (CMS/HCC) 2014    H/O STROKE - WEAKNESS ON LEFT SIDE. NO NEUROLOGIST. USES CANE FOR BALANCE.    Chronic renal failure, stage 5 (CMS/HCC) 05/08/2017    Constipation     Coronary artery disease 06/2016    H/O QUADRUPLE BYPASS SURGERY - FOLLOWED BY [facility].    Diabetes mellitus (CMS/HCC)     Difficulty walking     WALKS WITH CANE. WILL USE WC IN HOSPITAL    End-stage renal disease (CMS/HCC) 04/2017    DIALYSIS [facility] [place] MON-WED-FRI. HAS  [facility] RIGHT CHEST    Gastroesophageal reflux disease     MED EFFECTIVE     History of MI (myocardial infarction)     Hx of CABG 06/2016    3 vessel     Hypercholesteremia     CONTROLLED WITH MEDS.    Hypertension     CONTROLLED WITH MEDS.    Myocardial infarction (CMS/HCC) 06/2016    Osteopenia of multiple sites 10/30/2018    Pre-diabetes     Renal insufficiency     CHRONIC KIDNEY DISEASE - FOLLOWED BY DR. CURLY.    Shortness of breath     MODERATE EXERTION     Type 2 diabetes mellitus, controlled (CMS/HCC)     AM FSBS 160-170. INSTRUCTED TO CONTROL DIET CLOSELY TONIGHT.  HAD BEEN OFF JANUVIA , RESTARTED 06/15/17. HA1C 9 06/15/17. MANAGED BY PCP.    [8]   Past Surgical History:  Procedure  Laterality Date    BRONCHOSCOPY, FIBEROPTIC N/A 06/21/2017    Procedure: BRONCHOSCOPY with brushing and wash;  Surgeon: Fernand Arlyn Lazier, MD;  Location: [place] [facility];  Service: Pulmonary;  Laterality: N/A;    BRONCHOSCOPY, FIBEROPTIC N/A 05/22/2019    Procedure: BRONCHOSCOPY w/ wash & brush;  Surgeon: Fernand Arlyn DEL, MD;  Location: [place] [facility];  Service: Pulmonary;  Laterality: N/A;    CORONARY ARTERY BYPASS N/A 06/12/2016    Procedure: Coronary Artery Bypass x 4.;  Surgeon: Clent Camellia CROME, MD;  Location: [facility] OR;  Service: Cardiothoracic;  Laterality: N/A;  LIMA to LDA  Vein to PDA  Vein to OM  Vein to Diagonal    EGD, COLONOSCOPY N/A 10/04/2016    EGD, COLONOSCOPY N/A 10/04/2016    Procedure: EGD w/ bx, COLONOSCOPY w/ bx, polypectomy;  Surgeon: Theador Colin RAMAN, MD;  Location: [place] [facility];  Service: Gastroenterology;  Laterality: N/A;    ENDOSCOPIC,VEIN HARVEST N/A 06/12/2016    Procedure: Endoscopic, Left Leg Greater Saphenous Vein Harvest;  Surgeon: Clent Camellia CROME, MD;  Location: [facility] OR;  Service: Cardiothoracic;  Laterality: N/A;  Left Leg Incision (medially, groin to ankle) @ 0742  Vein out of leg @ 0826  Vein prep complete @ 0845  Total time = 63 minutes    FORMATION, ARTERIOVENOUS FISTULA, UPPER EXTREMITY Left 05/31/2017    Procedure: FORMATION, UPPER EXTREMITY, A-V FISTULA;  Surgeon: Aurelio Arvis LABOR, MD;  Location: [facility];  Service: Vascular;  Laterality: Left;  LEFT ARM A-V FISTULA      REVISION, A-V FISTULA UPPER EXTREMITY Left 07/16/2017    Procedure: REVISION, A-V FISTULA UPPER EXTREMITY WITH PATCH ANGIOPLASTY;  Surgeon: Aurelio Arvis LABOR, MD;  Location: [facility];  Service: Vascular;  Laterality: Left;   [9]   Social History  Tobacco Use    Smoking status: Never    Smokeless tobacco: Never   Vaping Use    Vaping status: Never Used   Substance Use Topics    Alcohol  use: No    Drug use: No   [10]   Family History  Problem Relation Name Age of  Onset    No known problems Mother      No known problems Father     [11]   Allergies  Allergen Reactions    Mosquito (Culex Pipiens) Allergy Skin Test Shortness Of Breath     And  Trouble  breathing    Shellfish Allergy Hives    Shellfish Protein-Containing Drug Products Hives     New  allergy  New  allergy    Lisinopril  Hives and Rash     Patient cannot remember the exact nature of the allergy    Penicillins Rash and Swelling     numbness  Swelling,  numbness

## 2024-10-04 NOTE — ED Triage Notes (Signed)
 BIBA from dialysis with initial systolic of 210, which dialysis staff state dropped to 60 systolic as she syncopized in the chair. Arrives lethargic and diaphoretic. History of MI with 5 stents and renal disease, thinners unknown by EMS.

## 2024-10-04 NOTE — Consults (Signed)
 Jacqueline Weber  INTRA-PROFESSIONAL STROKE ALERT CARE NOTE     Stroke evaluation was requested by Troncone, Steven K, DO who is caring for the patient at [facility] and case was discussed by telephone briefly.       History of Present Illness:     Telephone Call Time  16:46     Jacqueline Weber is a 74 y.o. female with PMH most significant for ESRD (on HD), prior stroke (2014, right pontine with left weakness at the time, unclear if any residual deficits), CAD, diabetes mellitus, hyperlipidemia, hypertension, GERD who presents with change in mental status.  Her last known well time was around 15:30 on 10/04/24 when she was at dialysis.  While at dialysis, she had a sudden spike in blood pressure then drop in blood pressure followed by altered mental status.  On EMS arrival, GCS was initially 8 though this has improved over time until arrival in the ED.  She additionally was hypoxic in the field.    On ED team exam, she is awake and can respond, though is not following commands or speaking.  There is a mild left nasolabial fold flattening.  There is additionally some baseline BLE weakness.  She moves all extremities and there is no clear asymmetry on movements.  She is inattentive to Laotian interpreter iPad as well as her son.    Patient Data / Imaging:      Initial Vital Signs:        BP:(!) 181/111 (10/04/24 1633)       Pulse:(!) 126 (10/04/24 1633)        Resp:(!) 24 (10/04/24 1659)       Temp:98.3 F (36.8 C) (10/04/24 1633)       SpO2:95 % (10/04/24 1633)         Imaging personally reviewed:     CT (Non-Contrast) Wet Read Time of wet read: 1651  CT Findings: no ICH, no territorial hypodensity   CTA Wet Read Time of wet read:    CTA Findings:  (Following formal read) no intracranial LVO  Large Vessel Occlusion:      CTP Wet Read Perfusion scan:        Impression/Recommendations:   The etiology of the patient's altered mental status is not fully clear.  Overall no clear focality on exam outside of what  seems to be possibly attributable to prior stroke (though baseline exam unknown), with workup in the ED showing several possible lab abnormalities as suspected triggers for acute toxic metabolic encephalopathy, including hypoxia on ABG, lactic acidosis (with concern for sepsis), leukocytosis.  Alternatively, dialysis disequilibrium syndrome would also be a consideration given that this happened during dialysis with while blood pressure fluctuation.    Patient is not a candidate for TNK as she is improving without focality, with higher suspicion for nonstroke etiology and is not a candidate for thrombectomy as there is no LVO on CTA.      - Toxic metabolic workup as per primary team and ED  - Can consider MRI brain if no alternative etiology felt to account for her symptoms and/or persistent symptoms    Plan for transfer to [facility]: no    Thank you for allowing us  to participate in the care of Jacqueline Weber.  Case was discussed with Jacqueline Weber, Jacqueline POUR, DO. Please contact us  with any questions at any time.    The Tele-stroke neurologist's evaluation is limited to the acute assessment and triage of patients for potential time-sensitive  stroke interventions, specifically intravenous thrombolysis and/or mechanical endovascular thrombectomy. This interaction does not constitute a comprehensive neurological evaluation and is not intended to replace in-person consultation or ongoing neurological care. Formal neurology consultation, including post-intervention management and broader diagnostic evaluation, must be initiated through the hospital's local neurology consult service. Responsibility for continued care and follow-up remains with the local treating team.    Jacqueline Campi, MD PhD  Vascular Neurology    Time on encounter (including reviewing chart, reviewing images, reviewing labs, discussing case w/ outside hospital provider, documentation): 22 minutes

## 2024-10-04 NOTE — Progress Notes (Signed)
 4 eyes in 4 hours pressure injury assessment note:      RN or CNA Completed with: Anton CT           Bony Prominences: Check appropriate box below     If wound is present add wound to LDA avatar      Occiput:   [x]  WNL   []  Wound present    Face:                     [x]  WNL    []  Wound present    Ears:      [x]  WNL   []  Wound present    Spine:    [x]  WNL   []  Wound present    Shoulders:    [x]  WNL    []  Wound present    Elbows:    [x]  WNL    []  Wound present    Sacrum/coccyx:   [x]  WNL    []  Wound present    Ischial Tuberosity:  [x]  WNL  []  Wound present    Trochanter/Hip:       [x]  WNL  []  Wound present    Knees:     [x]  WNL  []  Wound present    Ankles:     [x]  WNL  []  Wound present    Heels:    [x]  WNL  []  Wound present      Other pressure areas:      Wound Location:      Device related: []  Device:       Consult WOCN for guidance & staging of pressure injuries.

## 2024-10-04 NOTE — Plan of Care (Signed)
 Consult received  Reviewed ER visit  Will follow

## 2024-10-05 ENCOUNTER — Inpatient Hospital Stay: Payer: PRIVATE HEALTH INSURANCE

## 2024-10-05 LAB — RENAL FUNCTION PANEL
Albumin: 2.8 g/dL — ABNORMAL LOW (ref 3.5–4.9)
Anion Gap: 11 (ref 5.0–15.0)
BUN: 23 mg/dL — ABNORMAL HIGH (ref 7–21)
CO2: 28 meq/L (ref 17–29)
Calcium: 7.9 mg/dL (ref 7.9–10.2)
Chloride: 96 meq/L — ABNORMAL LOW (ref 99–111)
Creatinine: 4.4 mg/dL — ABNORMAL HIGH (ref 0.4–1.0)
GFR: 10 mL/min/1.73 m2 — ABNORMAL LOW (ref >=60.0)
Glucose: 292 mg/dL — ABNORMAL HIGH (ref 70–100)
Phosphorus: 3.7 mg/dL (ref 2.3–4.7)
Potassium: 4.1 meq/L (ref 3.5–5.3)
Sodium: 135 meq/L (ref 135–145)

## 2024-10-05 LAB — HEMOGLOBIN A1C
Average Estimated Glucose: 185.8 mg/dL
Hemoglobin A1C: 8.1 % — ABNORMAL HIGH (ref 4.6–5.6)

## 2024-10-05 LAB — APTT: PTT: 34 s (ref 27–39)

## 2024-10-05 LAB — WHOLE BLOOD GLUCOSE POCT
Whole Blood Glucose POCT: 270 mg/dL — ABNORMAL HIGH (ref 70–100)
Whole Blood Glucose POCT: 320 mg/dL — ABNORMAL HIGH (ref 70–100)
Whole Blood Glucose POCT: 290 mg/dL — ABNORMAL HIGH (ref 70–100)
Whole Blood Glucose POCT: 292 mg/dL — ABNORMAL HIGH (ref 70–100)

## 2024-10-05 LAB — LIPID PANEL
Cholesterol / HDL Ratio: 3.5 {index}
Cholesterol: 140 mg/dL (ref <=199)
HDL: 40 mg/dL (ref >=40)
LDL Calculated: 87 mg/dL (ref 0–99)
Non-HDL Cholesterol: 100 mg/dL (ref <130.0)
Triglycerides: 62 mg/dL (ref 34–149)
VLDL Calculated: 10 mg/dL (ref 10–40)

## 2024-10-05 LAB — CBC
Absolute nRBC: 0 10*3/uL (ref <=0.00)
Hematocrit: 25.7 % — ABNORMAL LOW (ref 34.7–43.7)
Hemoglobin: 7.9 g/dL — ABNORMAL LOW (ref 11.4–14.8)
MCH: 21.6 pg — ABNORMAL LOW (ref 25.1–33.5)
MCHC: 30.7 g/dL — ABNORMAL LOW (ref 31.5–35.8)
MCV: 70.2 fL — ABNORMAL LOW (ref 78.0–96.0)
MPV: 10.6 fL (ref 8.9–12.5)
Platelet Count: 142 10*3/uL (ref 142–346)
RBC: 3.66 10*6/uL — ABNORMAL LOW (ref 3.90–5.10)
RDW: 19 % — ABNORMAL HIGH (ref 11–15)
WBC: 17.6 10*3/uL — ABNORMAL HIGH (ref 3.10–9.50)
nRBC %: 0 /100{WBCs} (ref <=0.0)

## 2024-10-05 LAB — PT/INR
INR: 1 (ref 0.9–1.1)
PT: 12.1 s (ref 10.1–12.9)

## 2024-10-05 LAB — THYROID STIMULATING HORMONE (TSH) WITH REFLEX TO FREE T4: TSH: 0.48 u[IU]/mL (ref 0.35–4.94)

## 2024-10-05 MED ORDER — CARBOXYMETHYLCELLULOSE SODIUM 0.5 % OP SOLN
1.0000 [drp] | Freq: Three times a day (TID) | OPHTHALMIC | Status: DC | PRN
  Administered 2024-10-05 – 2024-10-06 (×2): 1 [drp] via OPHTHALMIC
  Filled 2024-10-05: qty 1

## 2024-10-05 MED ORDER — DOXYCYCLINE MONOHYDRATE 100 MG PO CAPS
100.0000 mg | ORAL_CAPSULE | Freq: Two times a day (BID) | ORAL | Status: DC
  Administered 2024-10-05 – 2024-10-06 (×2): 100 mg via ORAL
  Filled 2024-10-05 (×2): qty 1

## 2024-10-05 MED ORDER — SODIUM CHLORIDE 0.9 % IV MBP
1.0000 g | INTRAVENOUS | Status: DC
  Administered 2024-10-05 – 2024-10-06 (×2): 1 g via INTRAVENOUS
  Filled 2024-10-05 (×2): qty 1000

## 2024-10-05 MED ORDER — SODIUM CHLORIDE 0.9 % IV SOLN VIAL2BAG
500.0000 mg | INTRAVENOUS | Status: DC
  Administered 2024-10-05: 500 mg via INTRAVENOUS
  Filled 2024-10-05 (×2): qty 500

## 2024-10-05 NOTE — Progress Notes (Signed)
 Case Management location: onsite  Initial Case Management Assessment and Discharge Planning  [facility]   Patient Name: CLYDELL, ALBERTS   Date of Birth October 02, 1950   Attending Physician: Gaynelle Coy, MD   Primary Care Physician: Pcp, None, MD   Length of Stay 1   Reason for Consult / Chief Complaint 1. Altered mental status, unspecified altered mental status type    2. SIRS (systemic inflammatory response syndrome) (CMS/HCC)    3. Elevated lactic acid level    4. Hypoxia    5. Elevated blood pressure reading            PCP: PCP None, MD  will order bridge.    Preferred Pharmacy @ discharge:  Corrie is correct:    [id number] - [place], [place] - [address]  [phone]  [place] [address]  Phone: [phone] Fax: [phone]    CM spoke with *son Aurora* to complete initial discharge planning assessment. Demographics on face sheet were verified.    Healthcare agent:   son Dow    Living arrangements: lives with family / extended family in town house    Prior level of functioning     ADLs: needs assistance   Ambulation: mostly uses nothing / has walker if needed   Driving: family drives / patient does not    DME: has walker, uses it sometimes. Nothing else.    Home or community services:     Past / present rehab: no    Support systems: family and extended family    Social / financial barriers:  none    Discharge transportation:  family will drive    DISPO Plan A: Home with family    DISPO Plan B: Home with home health    BARRIERS TO DISCHARGE:  Medical readiness    Leita Leeroy Canary RN, MSW  Case Manager  [facility]

## 2024-10-05 NOTE — Plan of Care (Signed)
 Problem: Neurological Deficit  Goal: Neurological status is stable or improving  Flowsheets (Taken 10/05/2024 0033 by Emory Millman, RN)  Neurological status is stable or improving:   Monitor/assess/document neurological assessment (Stroke: every 4 hours)   Re-assess NIH Stroke Scale for any change in status   Perform CAM Assessment   Observe for seizure activity and initiate seizure precautions if indicated   Monitor/assess NIH Stroke Scale     Problem: Potential for Aspiration  Goal: Risk of aspiration will be minimized  Flowsheets (Taken 10/05/2024 1024)  Risk of aspiration will be minimized:   Assess/monitor ability to swallow using dysphagia screen: Keep patient NPO if patient fails screening   Place swallow precaution signage above bed and supervise patient during oral intake   Monitor/assess for signs of aspiration (tachypnea, cough, wheezing, clearing throat, hoarseness after eating, decrease in SaO2)   Consult/collaborate/follow recommended modified texture diet/thicken liquids as indicated by Speech Pathologist   Place patient up in chair to eat, if possible/head of bed up 90 degrees to eat if unable to be out of bed     Problem: Peripheral Neurovascular Impairment  Goal: Extremity color, movement, sensation are maintained or improved  Flowsheets (Taken 10/05/2024 1024)  Extremity color, movement, sensation are maintained or improved:   Assess and monitor application of corrective devices (cast, brace, splint), check skin integrity   Assess extremity for proper alignment     Problem: Impaired Mobility  Goal: Mobility/Activity is maintained at optimal level for patient  Flowsheets (Taken 10/05/2024 0033 by Emory Millman, RN)  Mobility/activity is maintained at optimal level for patient:   Encourage independent activity per ability   Consult/collaborate with Physical Therapy and/or Occupational Therapy     Problem: Communication Impairment  Goal: Will be able to express needs and understand  communication  Flowsheets (Taken 10/05/2024 0033 by Emory Millman, RN)  Able to express needs and understand communication:   Provide alternative method of communication if needed   Consult/collaborate with Case Management/Social Work   Patient/patient care companion demonstrates understanding on disease process, treatment plan, medications and discharge plan   Consult/collaborate with Speech Language Pathology (SLP)   Include patient care companion in decisions related to communication     Problem: Inadequate Gas Exchange  Goal: Adequate oxygenation and improved ventilation  Flowsheets (Taken 10/05/2024 1024)  Adequate oxygenation and improved ventilation:   Assess lung sounds   Monitor SpO2 and treat as needed   Provide mechanical and oxygen support to facilitate gas exchange   Teach/reinforce use of incentive spirometer 10 times per hour while awake, cough and deep breath as needed   Plan activities to conserve energy: plan rest periods   Consult/collaborate with Respiratory Therapy     Problem: Every Day - Stroke  Goal: Neurological status is stable or improving  Flowsheets (Taken 10/05/2024 0033 by Emory Millman, RN)  Neurological status is stable or improving:   Monitor/assess/document neurological assessment (Stroke: every 4 hours)   Re-assess NIH Stroke Scale for any change in status   Perform CAM Assessment   Observe for seizure activity and initiate seizure precautions if indicated   Monitor/assess NIH Stroke Scale  Goal: Mobility/Activity is maintained at optimal level for patient  Flowsheets (Taken 10/05/2024 0033 by Emory Millman, RN)  Mobility/activity is maintained at optimal level for patient:   Encourage independent activity per ability   Consult/collaborate with Physical Therapy and/or Occupational Therapy  Goal: Will be able to express needs and understand communication  Flowsheets (Taken 10/05/2024 0033 by Emory Millman,  RN)  Able to express needs and understand communication:   Provide alternative  method of communication if needed   Consult/collaborate with Case Management/Social Work   Patient/patient care companion demonstrates understanding on disease process, treatment plan, medications and discharge plan   Consult/collaborate with Speech Language Pathology (SLP)   Include patient care companion in decisions related to communication     Problem: Compromised Sensory Perception  Goal: Sensory Perception Interventions  Flowsheets (Taken 10/05/2024 0800)  Sensory Perception Interventions: Offload heels, Pad bony prominences, Reposition q 2hrs/turn Clock, Q2 hour skin assessment under devices if present     Problem: Compromised Moisture  Goal: Moisture level Interventions  Flowsheets (Taken 10/05/2024 0800)  Moisture level Interventions: Moisture wicking products, Moisture barrier cream     Problem: Compromised Activity/Mobility  Goal: Activity/Mobility Interventions  Flowsheets (Taken 10/05/2024 0800)  Activity/Mobility Interventions: Pad bony prominences, TAP Seated positioning system when OOB, Promote PMP, Reposition q 2 hrs / turn clock, Offload heels     Problem: Compromised Nutrition  Goal: Nutrition Interventions  Flowsheets (Taken 10/05/2024 0800)  Nutrition Interventions: Discuss nutrition at Rounds, I&Os, Document % meal eaten, Daily weights     Problem: Compromised Friction/Shear  Goal: Friction and Shear Interventions  Flowsheets (Taken 10/05/2024 0800)  Friction and Shear Interventions: Pad bony prominences, Off load heels, HOB 30 degrees or less unless contraindicated, Consider: TAP seated positioning, Heel foams

## 2024-10-05 NOTE — OT Eval Note (Signed)
 Occupational Therapy Evaluation  Patient: Jacqueline Weber    F757/F757.A  Discharge Recommendations:   Based on today's session: Home with home health OT;Home with supervision     DME needs if pt returns home: DME Recommended for Discharge: Shower chair      Assessment:   Jacqueline Weber is a 74 y.o. female admitted 10/04/2024. Pt's presents with:  Assessment: decreased strength;balance deficits;decreased independence with ADLs;decreased independence with IADLs;decreased endurance/activity tolerance. Pt would continue to benefit from OT to address these deficits and increase functional independence.      Therapy Diagnosis: generalized weakness, decreased functional mobility , decreased independence with ADL's, and decreased endurance/ activity engagement due to current condition. Without therapy interventions, patient is at risk for falls, decreased independence, failure to return to PLOF, and decreased quality of life.    Rehabilitation Potential: Prognosis: Good;With continued OT s/p acute discharge    Plan:   OT Frequency Recommended: 2-3x/wk   Treatment Interventions: ADL retraining;Functional transfer training;UE strengthening/ROM;Compensatory technique education          Risks/Benefits/POC Discussed with Pt/Family: With patient/family    Goals:   Goal Formulation: Patient  Time For Goal Achievement: by time of discharge  ADL Goals  Patient will groom self: Supervision;at sinkside;5 visits  Patient will dress lower body: Supervision;5 visits  Patient will toilet: Supervision;5 visits  Mobility and Transfer Goals  Pt will perform functional transfers: Supervision;with rolling walker;5 visits                           Unit: [facility] MAIN ACUTE CARE  Bed: F757/F757.A      OT Received On: 10/05/24  Start Time: 1135  Stop Time: 1151  Time Calculation (min): 16 min  OT Visit Number: 1    Consult received for Jacqueline Weber for OT Evaluation and Treatment.  Patients medical condition is  appropriate for Occupational therapy intervention at this time.    Precautions and Contraindications:   Precautions  Weight Bearing Status: no restrictions  Fall Risks: Medium;Impaired balance/gait;Impaired mobility  Other Precautions: h/o CVA with residual LLE weakness      Medical Diagnosis: SIRS (systemic inflammatory response syndrome) (CMS/HCC) [R65.10]  Altered mental status, unspecified altered mental status type [R41.82]    History of Present Illness: Jacqueline Weber is a 74 y.o. female admitted on 10/04/2024 with AMS.     Problem List[1]     Past Medical/Surgical History:  Medical History[2]   Past Surgical History[3]      X-Rays/Tests/Labs:  XR Chest AP Portable  Result Date: 10/05/2024  Stable bibasilar atelectasis and patchy airspace opacity left greater than right. Lamarr Bellini, MD 10/05/2024 11:08 AM    MRI brain without contrast  Result Date: 10/05/2024  1.No evidence of acute intracranial process. 2.Mild/moderate chronic microangiopathic changes with small chronic cortical infarcts in the left parietal and frontal lobes. 3.Extensive mucosal thickening throughout the paranasal sinuses and fluid opacification of bilateral mastoid air cells; correlate for infection. Justus Radford, MD 10/05/2024 5:40 AM    CT Chest Abdomen Pelvis WO Contrast  Result Date: 10/04/2024  1.Multifocal pneumonia with endobronchial debris, likely aspiration. 2.No acute abnormality in the abdomen or pelvis. 3.Nodular liver contours suggesting fibrosis or cirrhosis. 4.Chronic appearing multilevel compression deformities throughout the thoracolumbar spine. Burnadette Door, MD 10/04/2024 10:34 PM    CTA Head & Neck  Result Date: 10/04/2024  1.There is 50-60% stenosis of the proximal right internal carotid artery based on NASCET criteria. 2.There  is 10-15% stenosis of the proximal left internal carotid artery based on NASCET criteria. 3.The vertebrobasilar arterial system is patent. 4.There is no proximal intracranial large vessel occlusion. Kushal  Y. Albina, MD 10/04/2024 7:00 PM    Chest  AP Portable  Result Date: 10/04/2024   Minimal basilar lung opacities are unchanged compared to previous exam and are probably atelectasis. Deward PARAS. Stacia, MD 10/04/2024 5:45 PM    CT Head WO Contrast  Result Date: 10/04/2024  1. No acute intracranial hemorrhage is detected. 2. There is moderate subcortical, deep, and periventricular leukomalacia in both cerebral hemispheres.  This is suggestive of chronic small vessel ischemic change. Belvie DOROTHA Lack, MD 10/04/2024 4:58 PM      Social History:  Prior Level of Function  Prior level of function: Independent with ADLs, Ambulates independently (Pt's son reports someone is usually with the pt when she walks short distances in the home, otherwise pt furniture and wall walks.)  Baseline Activity Level: Household ambulation  Driving: does not drive  DME Currently at Home: [employer], [employer]  Home Living Arrangements  Living Arrangements: Children (Pt lives with her daughter, and pt has a total of 6 children who are available to assist as needed)  Type of Home: House (townhome)  Home Layout: Two level, Bed/bath upstairs (no STE)  Bathroom Shower/Tub: Tub/shower unit  DME Currently at Home: [employer], [employer]      Subjective:   Patient is agreeable to participation in the therapy session. Nursing clears patient for therapy.     Pain Assessment  Pain Assessment: No/denies pain.        Objective:   Observation of Patient/Vital Signs:  Patient is in bed with telemetry and peripheral IV, O2 at 3 liters/minute via nasal cannula in place.         Cognition/Neuro Status  Arousal/Alertness: Appropriate responses to stimuli  Attention Span: Appears intact  Orientation Level: Oriented X4  Following Commands: minimal verbal instruction  Safety Awareness: minimal verbal instruction  Insights: Educated in safety awareness  Behavior: calm;cooperative    Gross ROM  Right Upper Extremity ROM: within functional limits  Left Upper Extremity ROM:  within functional limits  Gross Strength  Right Upper Extremity Strength: 4+/5  Left Upper Extremity Strength: 4-/5  *MMT scale per [employer] Muscle Grading System          Sensory  Auditory: intact       Self-care and Home Management  Grooming: Stand by Assist;standing at sink  LB Dressing: Minimal Assist;sitting;edge of bed  Functional Transfers: Stand by Assist    Mobility and Transfers  Supine to Sit: Stand by Assist  Sit to Supine: Stand by Assist  Sit to Stand: Supervision  Stand to Sit: Supervision  Functional Mobility/Ambulation: Stand by Assist (with RW)     Balance  Static Sitting Balance: good  Dyanamic Sitting Balance: good  Static Standing Balance: good  Dynamic Standing Balance: fair    Participation and Endurance  Participation Effort: good  Endurance: Tolerates 10 - 20 min exercise with multiple rests    AM-PAC 6 Clicks Daily Activity Inpatient Short Form  Inpatient AM-PAC Performed?: yes  Put On/Take Off Lower Body Clothing: A little  Assist with Bathing: A little  Assist with Toileting: A little  Put On/Take Off Upper Body Clothing: A little  Assist with Grooming: A little  Assist with Eating: None  OT Daily Activity Raw Score: 19  CMS 0-100% Score: 42.80%    PMP - Progressive Mobility  Protocol   PMP Activity: Step 6 - Walks in Room  Distance Walked (ft) (Step 6,7): 20 Feet    Treatment Activities:     Educated the patient to role of occupational therapy, plan of care, goals of therapy and safety with mobility and ADLs, energy conservation techniques.    OT left the patient in the bed, alarm on, and call bell within reach. RN notified of session outcome.       Therapist PPE during session gloves    Carolyn Bellman, OTD, OTR/L  Physical Medicine and Rehabilitation         [1]   Patient Active Problem List  Diagnosis    Primary hypertension    Mixed hyperlipidemia    History of CVA (cerebrovascular accident)    Anemia due to chronic kidney disease, on chronic dialysis (CMS/HCC)    Type 2 diabetes  mellitus with complication, with long-term current use of insulin  (CMS/HCC)    Bronchiectasis (CMS/HCC)    Altered mental status, unspecified altered mental status type    ESRD on hemodialysis TTS    Sepsis without acute organ dysfunction, due to unspecified organism (CMS/HCC)    Leukocytosis, unspecified type    Gastroesophageal reflux disease without esophagitis   [2]   Past Medical History:  Diagnosis Date    Abnormal result of cardiovascular function study, unspecified 10/28/2015    Allergic rhinitis due to pollen 01/25/2021    Anemia 10/02/2016    H/O ANEMIA - REFLECTED ON CURRENT LABS.    Aortic valve insufficiency 03/21/2017    Asthma NA    Atherosclerotic heart disease of native coronary artery with unspecified angina pectoris 04/10/2016    Bronchiectasis (CMS/HCC) 06/21/2017    Carotid bruit 11/18/2015    Cataracts, bilateral     H/O CATARACTS - SURGERY DONE - STILL HAS SOME ISSUES.    Cerebrovascular accident (CMS/HCC) 2014    H/O STROKE - WEAKNESS ON LEFT SIDE. NO NEUROLOGIST. USES CANE FOR BALANCE.    Cerebrovascular disease 04/24/2017    Chronic asthmatic bronchitis (CMS/HCC) 12/01/2020    Never smoker but exp to wood burning indoor fires in [place]   - 11/30/2020  After extensive coaching inhaler device,  effectiveness =      0% so changed to neb bud/ performist  Last Assessment & Plan:    Never smoker but exp to wood burning indoor fires in [place]   - 11/30/2020  After extensive coaching inhaler device,  effectiveness =      0% so changed to neb bud/ performist trial basis - if can't aff    Chronic cough 11/30/2020    Broncheictasis on CT chest 11/25/2018   - 11/30/2020 trial of bud/performist bid for AB component plus max rx for GERD    - Sinus CT  12/15/20   Extensive pansinusitis> refer to ENT      Chronic renal failure, stage 5 (CMS/HCC) 05/08/2017    Chronic sinusitis 12/21/2020    Constipation     Coronary artery disease 06/2016    H/O QUADRUPLE BYPASS SURGERY - FOLLOWED BY [facility].    Diabetes mellitus  (CMS/HCC)     Diabetic peripheral neuropathy (CMS/HCC) 06/18/2023    Difficulty walking     WALKS WITH CANE. WILL USE WC IN HOSPITAL    End-stage renal disease (CMS/HCC) 04/2017    DIALYSIS [facility] [place] MON-WED-FRI. 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    Hyperlipidemia 03/11/2013    Hypertension     CONTROLLED WITH MEDS.    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 CURLY.    Shortness of breath     MODERATE EXERTION     Stage 5 chronic kidney disease on chronic dialysis (CMS/HCC) 08/21/2019    M,W,F - Dr. Lenward      Type 2 diabetes mellitus with diabetic chronic kidney disease (CMS/HCC) 04/24/2017    Type 2 diabetes mellitus, controlled (CMS/HCC)     AM FSBS 160-170. INSTRUCTED TO CONTROL DIET CLOSELY TONIGHT.  HAD BEEN OFF JANUVIA , RESTARTED 06/15/17. HA1C 9 06/15/17. MANAGED BY PCP.     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) 06/19/2023   [3]   Past Surgical History:  Procedure Laterality Date    BRONCHOSCOPY, FIBEROPTIC N/A 06/21/2017    Procedure: BRONCHOSCOPY with brushing and wash;  Surgeon: Fernand Arlyn Lazier, MD;  Location: [place] [facility] OR;  Service: Pulmonary;  Laterality: N/A;    BRONCHOSCOPY, FIBEROPTIC N/A 05/22/2019    Procedure: BRONCHOSCOPY w/ wash & brush;  Surgeon: Fernand Arlyn DEL, MD;  Location:  [place] [facility] OR;  Service: Pulmonary;  Laterality: N/A;    CORONARY ARTERY BYPASS N/A 06/12/2016    Procedure: Coronary Artery Bypass x 4.;  Surgeon: Clent Camellia CROME, MD;  Location: [facility] OR;  Service: Cardiothoracic;  Laterality: N/A;  LIMA to LDA  Vein to PDA  Vein to OM  Vein to Diagonal    EGD, COLONOSCOPY N/A 10/04/2016    EGD, COLONOSCOPY N/A 10/04/2016    Procedure: EGD w/ bx, COLONOSCOPY w/ bx, polypectomy;  Surgeon: Theador Colin RAMAN, MD;  Location: [place] [facility] OR;  Service: Gastroenterology;  Laterality: N/A;    ENDOSCOPIC,VEIN HARVEST N/A 06/12/2016    Procedure: Endoscopic, Left Leg Greater Saphenous Vein Harvest;  Surgeon: Clent Camellia CROME, MD;  Location: [facility] OR;  Service: Cardiothoracic;  Laterality: N/A;  Left Leg Incision (medially, groin to ankle) @ 0742  Vein out of leg @ 0826  Vein prep complete @ 0845  Total time = 63 minutes    FORMATION, ARTERIOVENOUS FISTULA, UPPER EXTREMITY Left 05/31/2017    Procedure: FORMATION, UPPER EXTREMITY, A-V FISTULA;  Surgeon: Aurelio Arvis LABOR, MD;  Location: [place] MAIN OR;  Service: Vascular;  Laterality: Left;  LEFT ARM A-V FISTULA      REVISION, A-V FISTULA UPPER EXTREMITY Left 07/16/2017    Procedure: REVISION, A-V FISTULA UPPER EXTREMITY WITH PATCH ANGIOPLASTY;  Surgeon: Aurelio Arvis LABOR, MD;  Location: [place] MAIN OR;  Service: Vascular;  Laterality: Left;

## 2024-10-05 NOTE — Nursing Progress Note (Signed)
 Per Dr. Gaynelle order, stroke protocol discontinued.

## 2024-10-05 NOTE — Plan of Care (Signed)
 Problem: Neurological Deficit  Goal: Neurological status is stable or improving  Outcome: Progressing  Flowsheets (Taken 10/05/2024 0033)  Neurological status is stable or improving:   Monitor/assess/document neurological assessment (Stroke: every 4 hours)   Re-assess NIH Stroke Scale for any change in status   Perform CAM Assessment   Observe for seizure activity and initiate seizure precautions if indicated   Monitor/assess NIH Stroke Scale     Problem: Impaired Mobility  Goal: Mobility/Activity is maintained at optimal level for patient  Outcome: Progressing  Flowsheets (Taken 10/05/2024 0033)  Mobility/activity is maintained at optimal level for patient:   Encourage independent activity per ability   Consult/collaborate with Physical Therapy and/or Occupational Therapy     Problem: Communication Impairment  Goal: Will be able to express needs and understand communication  Outcome: Progressing  Flowsheets (Taken 10/05/2024 0033)  Able to express needs and understand communication:   Provide alternative method of communication if needed   Consult/collaborate with Case Management/Social Work   Patient/patient care companion demonstrates understanding on disease process, treatment plan, medications and discharge plan   Consult/collaborate with Speech Language Pathology (SLP)   Include patient care companion in decisions related to communication     Problem: Day of Admission - Stroke  Goal: Core/Quality measure requirements - Admission  Outcome: Progressing  Flowsheets (Taken 10/05/2024 0033)  Core/Quality measure requirements - Admission:   Document NIH Stroke Scale on admission   Document nursing swallow/dysphagia screen on admission. If patient fails, keep patient NPO (follow your hospital protocol on swallowing screening).   VTE Prevention: Ensure anticoagulant(s) administered and/or anti-embolism stockings/devices documented as ordered   Ensure antithrombotic administered or contraindication documented by LIP   If  diagnosis or history of Atrial Fib/Atrial Flutter, ensure oral anticoagulation is initiated or contraindication documented by LIP   Begin stroke education on admission (must include Modifiable Risk Factors, Warning Signs and Symptoms of Stroke, Activation of Emergency Medical System and Follow-up Appointments) Ensure handout has been given and documented.   Ensure lipid panel ordered   Ensure PT/OT and/or SLP ordered     Problem: Every Day - Stroke  Goal: Neurological status is stable or improving  Outcome: Progressing  Flowsheets (Taken 10/05/2024 0033)  Neurological status is stable or improving:   Monitor/assess/document neurological assessment (Stroke: every 4 hours)   Re-assess NIH Stroke Scale for any change in status   Perform CAM Assessment   Observe for seizure activity and initiate seizure precautions if indicated   Monitor/assess NIH Stroke Scale  Goal: Mobility/Activity is maintained at optimal level for patient  Outcome: Progressing  Flowsheets (Taken 10/05/2024 0033)  Mobility/activity is maintained at optimal level for patient:   Encourage independent activity per ability   Consult/collaborate with Physical Therapy and/or Occupational Therapy  Goal: Will be able to express needs and understand communication  Outcome: Progressing  Flowsheets (Taken 10/05/2024 0033)  Able to express needs and understand communication:   Provide alternative method of communication if needed   Consult/collaborate with Case Management/Social Work   Patient/patient care companion demonstrates understanding on disease process, treatment plan, medications and discharge plan   Consult/collaborate with Speech Language Pathology (SLP)   Include patient care companion in decisions related to communication  Goal: Core/Quality measure requirements - Daily  Outcome: Progressing  Flowsheets (Taken 10/05/2024 0033)  Core/Quality measure requirements - Daily:   VTE Prevention: Ensure anticoagulant(s) administered and/or anti-embolism  stockings/devices documented by end of day 2   Ensure antithrombotic administered or contraindication documented by LIP by end  of day 2   Once lipid panel has resulted, check LDL. Contact provider for statin order if LDL > 70 (or ensure contraindication documented by LIP).   Continue stroke education (must include Modifiable Risk Factors, Warning Signs and Symptoms of Stroke, Activation of Emergency Medical System and Follow-up Appointments). Ensure handout has been given and documented.  Goal: Stable vital signs and fluid balance  Outcome: Progressing  Flowsheets (Taken 10/05/2024 0033)  Stable vital signs and fluid balance:   Position patient for maximum circulation/cardiac output   Monitor and assess vitals every 4 hours or as ordered and hemodynamic parameters   Apply telemetry monitor as ordered   Monitor intake and output. Notify LIP if urine output is < 30 mL/hour.   Encourage oral fluid intake  Goal: Patient will maintain adequate oxygenation  Outcome: Progressing  Flowsheets (Taken 10/05/2024 0033)  Patient will maintain adequate oxygenation:   Suction secretions as needed   Maintain SpO2 of greater than 92%   Sleep Apnea: Assess if previously tested or on CPAP   Sleep Apnea: Encourage patient to speak to PCP regarding sleep apnea/sleep study ([place] only)  Goal: Patient's risk of aspiration will be minimized  Outcome: Progressing  Flowsheets (Taken 10/05/2024 0033)  Patient's risk of aspiration will be minimized:   Complete new dysphagia screen for any change in status: Keep patient NPO if patient fails   Place swallow precaution signage above bed   Monitor/assess for signs of aspiration (tachypnea, cough, wheezing, clearing throat, hoarseness after eating, decrease in SaO2   Order modified texture diet as recommend by Speech Pathologist   Keep head of bed up a minimum of 30 degrees when hemodynamically stable   Thicken liquids as recommended by Speech Pathologist   Assess and monitor ability to swallow   Place  patient up in chair to eat, if possible   HOB up 90 degrees to eat if unable to be OOB   Do not use a straw   Consult/Collaborate with Speech Pathologist for dysphagia   Instruct patient to take small bites   Instruct patient to take small single sips of liquid   Supervise patient during oral intake  Goal: Nutritional intake is adequate  Outcome: Progressing  Flowsheets (Taken 10/05/2024 0033)  Nutritional intake is adequate:   Consult/collaborate with Clinical Nutritionist   Consult/collaborate with Speech Therapy (swallow evaluations)  Goal: Elimination patterns are normal or improving  Outcome: Progressing  Flowsheets (Taken 10/05/2024 0033)  Elimination patterns are normal or improving: Assess for and discuss C. diff screening with LIP  Goal: Skin integrity is maintained or improved  Outcome: Progressing  Flowsheets (Taken 10/05/2024 0033)  Skin integrity is maintained or improved:   Encourage use of lotion/moisturizer on skin   Keep skin clean and dry   Increase activity as tolerated/progressive mobility   Assess Braden Scale every shift   Relieve pressure to bony prominences   Monitor patient's hygiene practices   Avoid shearing   Keep head of bed 30 degrees or less (unless contraindicated)   Collaborate with Wound, Ostomy, and Continence Nurse   Utilize specialty bed  Goal: Neurovascular status is stable or improving  Outcome: Progressing  Flowsheets (Taken 10/05/2024 0033)  Neurovascular status is stable or improving:   Monitor/assess for signs of Venous Thrombus Emboli (edema of calf/thigh redness, pain)   Monitor/assess neurovascular status (pulses, capillary refill, pain, paresthesia, presence of edema)   Monitor/assess site of invasive procedure for signs of bleeding  Goal: Effective coping demonstrated  Outcome: Progressing  Flowsheets (Taken 10/05/2024 0033)  Effective coping demonstrated:   Assess/report to LIP uncontrolled anxiety, depression, or ineffective coping   Offer reassurance to decrease anxiety

## 2024-10-05 NOTE — Progress Notes (Signed)
 CRN rounding for elevated MEWS       Assessment and POC reviewed and discussed with attending RN. No concerns at this point in time. Appropriate Plan of Care in place. Staff to call if RRT needs arise.     Mews Score: 1  Sepsis screen  Positive  First antibiotic given greater than 24 hours ago No, 1/31 @ 1800  No sepsis workup required.    Hospital Admission Date: 10/04/2024  4:24 PM  Code Status: Full Code  RASS Score: Drowsy   Visit Vitals  BP 164/73   Pulse 75   Temp 98.3 F (36.8 C) (Rectal)   Resp 20   Wt 73.2 kg   SpO2 100%   BMI 31.52 kg/m      Recent Labs     10/04/24  1654   Sodium 137   Potassium 4.1   Chloride 93*   CO2 29   BUN 13   Creatinine 2.9*   Glucose 183*   Calcium  9.5   Magnesium  1.7     Recent Labs     10/04/24  1654   WBC 15.01*   Hemoglobin 9.7*   Hematocrit 32.0*     Recent Labs     10/04/24  1654   PT 12.0   INR 1.0   PTT 34       Date/Time: 10/05/24 6:33 AM

## 2024-10-05 NOTE — Progress Notes (Addendum)
 [facility]  Internal Medicine Hospitalists  Progress Note        Assessment / Plan:      Acute aspiration pneumonia with sepsis:  - Patient is hemodynamically stable  -Sepsis is improving with supportive care  -Given that the patient is a hemodialysis patient, we discontinued IV fluid hydration  -CT chest/abdomen/pelvis without contrast with evidence of multifocal pneumonia with endobronchial debris, likely aspiration.  Nodular liver contour suggesting fibrosis or cirrhosis.  Chronic appearing multilevel compression deformity throughout the thoracolumbar spine.  -Negative COVID-19 and influenza testing  -On IV cefepime  and vancomycin  therapies.  I will switch to IV ceftriaxone  and azithromycin  therapies  -Blood cultures negative so far.  Will follow-up results  -Continue strict aspiration precautions and we are waiting for input from speech  -No drastic serum leukocytosis or fevers.  -Continue monitoring    2.  Acute hypoxemic respiratory failure related to #1:  - Please wean oxygen off slowly as tolerated.  Saturating 98% on 3 L nasal cannula.  -Continue monitoring  -Continue to treat #1  -Continue supportive care    3.  Acute bronchospasm in the setting of #1:  - I do not appreciate more of this today.  I will wean IV steroids down to avoid worsening confusion  -Continue bronchodilators and supportive care    4.  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  History of end-stage renal disease on Tuesday, Thursday and Saturday:  - I spoke to nephrology as of IV hydration given overnight and there is no plan for repeat HD today.  Nephrology to decide on the next HD as per the usual schedule    6.  Acute toxic metabolic encephalopathy:  - In the setting of #1  -Improved dramatically with IV antibiotics per family  -Patient is to be evaluated by rehab services  - Acute stroke was ruled out.  Doubt TIA.   MRI of the brain without contrast is negative for acute findings.  Discontinue stroke protocol    7.  History of hypertension:  - Continue home medications including hydralazine  and carvedilol     8.  History of diabetes mellitus type 2:  - Continue insulin  sliding scale for now.  As on the blood glucose trend, decide if we need to add long-acting insulin     9.  Stable rest of chronic comorbidities      I updated the patient's son of the plan of care at the bedside    VTE Prophylaxis: heparin  (porcine) injection 5,000 Units  Place sequential compression device     Foley Catheter: No Foley Present    Venous Access: No Temporary Central Line Present    Medical Readiness for Discharge: Anticipated Tomorrow    Open Handoff Activity in Sidebar    Additional Diagnoses:      Please review above    Medications and Allergies:   Current Facility-Administered Medications[1]  Infusion Meds[2]  PRN Medications[3]  Allergies[4]    Subjective:      Patient's son is at the bedside and states that the patient's confusion is markedly better.  Patient herself does not have any complaints     Objective:      Temp:  [97.7 F (36.5 C)-103.1 F (39.5 C)] 97.7 F (36.5 C)  Heart Rate:  [73-126] 73  Resp Rate:  [19-40] 19  BP: (138-186)/(58-111) 147/66  HEENT: NC,AT,PERRLA,EOMI  Neck: supple, no JVD, no bruits  Chest: decreased breath sounds in the lung bases bilaterally  Heart: S1S2 heard, no M,R,G  Abdomen: +BS, NT, ND, no organomegaly or masses  Extremities: Pulse 1+ bilateral, no edema  Neurological exam: Alert awake oriented x 3, no deficits           [1]   Current Facility-Administered Medications   Medication Dose Route Frequency    aspirin   81 mg Oral Daily    Or    aspirin   300 mg Rectal Daily    atorvastatin   10 mg Oral Daily    carvedilol   3.125 mg Oral Daily    cefepime   1 g Intravenous Q24H    fluticasone   2 spray Each Nare Daily    fluticasone  furoate-vilanterol  1 puff Inhalation QAM    heparin  (porcine)  5,000 Units  Subcutaneous Q12H [facility]    hydrALAZINE   25 mg Oral BID    insulin  lispro  1-3 Units Subcutaneous QHS    insulin  lispro  1-5 Units Subcutaneous TID AC    methylPREDNISolone   40 mg Intravenous Q8H    montelukast   10 mg Oral QHS    pantoprazole   40 mg Oral QAM AC    RisaQuad  1 capsule Oral Daily    sevelamer  carbonate  1,600 mg Oral TID MEALS    umeclidinium bromide   1 puff Inhalation QAM    vancomycin    Intravenous See Admin Instructions   [2]   Current Facility-Administered Medications   Medication Dose Route Frequency Last Rate    sodium chloride    Intravenous Continuous Stopped (10/05/24 0905)   [3]   Current Facility-Administered Medications   Medication Dose Route    acetaminophen   650 mg Rectal    albuterol -ipratropium  3 mL Nebulization    alum & mag hydroxide-simethicone   15 mL Oral    benzocaine -menthol   1 lozenge Buccal    calcium  carbonate  1,000 mg Oral    dextrose   15 g of glucose Oral    Or    dextrose   12.5 g Intravenous    Or    dextrose   12.5 g Intravenous    Or    glucagon  (rDNA)  1 mg Intramuscular    hydrALAZINE   10 mg Intravenous    labetalol   10 mg Intravenous    melatonin  3 mg Oral    ondansetron   4 mg Intravenous    oxyCODONE   5 mg Oral    saline  2 spray Each Nare   [4]   Allergies  Allergen Reactions    Mosquito (Culex Pipiens) Allergy Skin Test Shortness Of Breath     And  Trouble  breathing    Shellfish Allergy Hives    Shellfish Protein-Containing Drug Products Hives     New  allergy  New  allergy    Lisinopril  Hives and Rash     Patient cannot remember the exact nature of the allergy    Penicillins Rash and Swelling     numbness  Swelling,  numbness

## 2024-10-05 NOTE — Progress Notes (Signed)
 10/05/24 0000   Rapid Response Vital Signs   Level of Consciousness Responds to voice   Temp (!) 101.4 F (38.6 C)   Temp src Rectal   ICU Nurse Consultation for MEWS/Sepsis   Reason for ICU Consultation MEWS of 4 or greater   Bedside Nurse Name RN Vernell   Role of Reviewer Other (comment)  (CRN Graeden Bitner)   Recommendations   (STAT lactate 1.3; given tylenol )   Glasgow Coma Scale   Eye Opening 3   Best Verbal Response 4   Best Motor Response 6   Glasgow Coma Scale Score 13

## 2024-10-05 NOTE — PT Eval Note (Signed)
 Physical Therapy Evaluation  Patient: Jacqueline Weber    F757/F757.A  Discharge Recommendations:   Based on today's session: Discharge Recommendation: Home with supervision;Home with home health PT     DME needs if pt returns home: DME Recommended for Discharge: Patient already has needed equipment      Assessment:   Jacqueline Weber is a 74 y.o. female admitted 10/04/2024. Pt's presents with:  Assessment: Decreased LE strength;Decreased endurance/activity tolerance;Decreased balance;Gait impairment. No further acute PT required. Will d/c.    Therapy Diagnosis: generalized weakness and increased gait dysfunction due to AMS. Without therapy interventions, patient is at risk for falls, dependence on caregivers for mobility, dependence on caregivers for ADL's, and decreased independence.    Rehabilitation Potential: Prognosis: Good;With continued PT status post acute discharge      Plan:      PT Frequency: one time visit - therapy discontinued    Risks/Benefits/POC Discussed with Pt/Family: With patient/family          Goals:           [facility] 25 MAIN ACUTE CARE M242/M242.A    PT Received On: 10/05/24  Start Time: 1135  Stop Time: 1152  Time Calculation (min): 17 min    PT Visit Number: 1    Consult received for Lon Kung for PT Evaluation and Treatment.  Patients medical condition is appropriate for Physical therapy intervention at this time.    Precautions and Contraindications:   Precautions  Weight Bearing Status: no restrictions  Fall Risks: Medium;Impaired balance/gait;Muscle weakness  Other Precautions: h/o CVA with residual LLE weakness    Medical Diagnosis: SIRS (systemic inflammatory response syndrome) (CMS/HCC) [R65.10]  Altered mental status, unspecified altered mental status type [R41.82]    History of Present Illness: Jacqueline Weber is a 74 y.o. female admitted on 10/04/2024 who speaks Laotian.  Patient's son at bedside speaks English source of history.  Patient was on HD earlier  today suddenly became confused.  Also had altered mental status with initial GCS of 8 and patient was on nonrebreather later on switched to nasal cannula oxygen found to have febrile tachycardic.  Got broad-spectrum antibiotics in ED.  Stroke neurology was involved due to sudden altered mental status unlikely stroke as per neurology likely secondary to underlying sepsis.  Per H&P    Problem List[1]     Past Medical/Surgical History:  Medical History[2]   Past Surgical History[3]      X-Rays/Tests/Labs:    Acute encephalopathy likely from underlying sepsis/metabolic CT head negative for acute pathology CT angiogram head and neck no large vessel occlusion per radiology report    Social History:  Prior Level of Function  Prior level of function: Independent with ADLs, Ambulates independently (Pt's son reports someone is usually with the pt when she walks short distances in the home, otherwise pt furniture and wall walks.)  Baseline Activity Level: Household ambulation  Driving: does not drive  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 Nursing clears patient for therapy. Pt's son interpreting for the pt. Pt can understand enough English to  follow simple commands.  Patient Goal  Patient Goal: none stated  Pain Assessment  Pain Assessment: No/denies pain       Objective:   Observation of Patient/Vital Signs:  Patient is in bed with telemetry and peripheral IV, O2 at 3 liters/minute via nasal cannula in place.         Cognition/Neuro Status  Arousal/Alertness: Appropriate responses to stimuli  Attention Span: Appears  intact  Orientation Level: Oriented X4  Memory: Appears intact  Following Commands: Follows all commands and directions without difficulty  Safety Awareness: minimal verbal instruction  Insights: Educated in safety awareness  Behavior: attentive;calm;cooperative      Hearing: WNL  Sensation: WNL    Gross ROM  Right Lower Extremity ROM: within functional limits  Left Lower Extremity ROM: within functional limits  Gross Strength  Right Lower Extremity Strength: 4/5  Left Lower Extremity Strength: 4-/5  Tone  Tone: within functional limits  *MMT scale per [employer] Muscle Grading System    Functional Mobility  Rolling: Supervision;to Left (with instructions to hold to bedrail)  Supine to Sit: Supervision  Scooting to EOB: Supervision  Sit to Supine: Supervision  Sit to Stand: Supervision  Stand to Sit: Supervision     Locomotion  Ambulation: Stand by Assist;with front-wheeled walker  Pattern: R foot decreased clearance;L foot decreased clearance  Distance Walked (ft) (Step 6,7): 20 Feet  PMP Activity: Step 6 - Walks in Room     Balance  Sitting - Static: Good  Sitting - Dynamic: Good  Standing - Static: Fair  Standing - Dynamic: Fair    Participation and Endurance  Participation Effort: good  Endurance: Tolerates 30 min exercise with multiple rests            AM-PAC 6 Clicks Basic Mobility Inpatient Short Form  Turning Over in Bed: A little  Sitting Down On/Standing From Armchair: None  Lying on Back to Sitting on Side of Bed: A little  Assist Moving to/from Bed to Chair: A little  Assist to Walk in Hospital Room: A little  Assist to Climb 3-5 Steps with Railing: A little  PT Basic Mobility Raw Score: 19  CMS 0-100% Score: 41.77%    Treatment Activities: When instructed to get OOB, the pt initially attempted to come to long sitting and asking PT and OT to pull her up by her arms. Pt was instructed to roll and hold onto the bedrail which she was able to do with SPV. Pt stood and displayed min sway in standing. Pt given  RW for ambulation and pt ambulated throughout the room with SPV and mod vcs for proper use of the RW as the pt tended to walk outside of the frame of the walker. Pt's son reports that the pt only walks short distances in the home with the assistance of family or wall/furniture walking.     Educated the patient to role of physical therapy, plan of care, goals of therapy and safety with mobility and ADLs.      Pt left supine in bed without needs, with call light in reach and bed alarm on. Pt's son present. RN notified of session outcome.    Therapist PPE during session gloves            [1]   Patient Active Problem List  Diagnosis    Primary hypertension    Mixed hyperlipidemia    History of CVA (cerebrovascular accident)    Anemia due to chronic kidney disease, on chronic dialysis (  CMS/HCC)    Type 2 diabetes mellitus with complication, with long-term current use of insulin  (CMS/HCC)    Bronchiectasis (CMS/HCC)    Altered mental status, unspecified altered mental status type    ESRD on hemodialysis TTS    Sepsis without acute organ dysfunction, due to unspecified organism (CMS/HCC)    Leukocytosis, unspecified type    Gastroesophageal reflux disease without esophagitis   [2]   Past Medical History:  Diagnosis Date    Abnormal result of cardiovascular function study, unspecified 10/28/2015    Allergic rhinitis due to pollen 01/25/2021    Anemia 10/02/2016    H/O ANEMIA - REFLECTED ON CURRENT LABS.    Aortic valve insufficiency 03/21/2017    Asthma NA    Atherosclerotic heart disease of native coronary artery with unspecified angina pectoris 04/10/2016    Bronchiectasis (CMS/HCC) 06/21/2017    Carotid bruit 11/18/2015    Cataracts, bilateral     H/O CATARACTS - SURGERY DONE - STILL HAS SOME ISSUES.    Cerebrovascular accident (CMS/HCC) 2014    H/O STROKE - WEAKNESS ON LEFT SIDE. NO NEUROLOGIST. USES CANE FOR BALANCE.    Cerebrovascular disease 04/24/2017    Chronic asthmatic bronchitis (CMS/HCC) 12/01/2020    Never  smoker but exp to wood burning indoor fires in [place]   - 11/30/2020  After extensive coaching inhaler device,  effectiveness =      0% so changed to neb bud/ performist  Last Assessment & Plan:    Never smoker but exp to wood burning indoor fires in [place]   - 11/30/2020  After extensive coaching inhaler device,  effectiveness =      0% so changed to neb bud/ performist trial basis - if can't aff    Chronic cough 11/30/2020    Broncheictasis on CT chest 11/25/2018   - 11/30/2020 trial of bud/performist bid for AB component plus max rx for GERD    - Sinus CT  12/15/20   Extensive pansinusitis> refer to ENT      Chronic renal failure, stage 5 (CMS/HCC) 05/08/2017    Chronic sinusitis 12/21/2020    Constipation     Coronary artery disease 06/2016    H/O QUADRUPLE BYPASS SURGERY - FOLLOWED BY [facility].    Diabetes mellitus (CMS/HCC)     Diabetic peripheral neuropathy (CMS/HCC) 06/18/2023    Difficulty walking     WALKS WITH CANE. WILL USE WC IN HOSPITAL    End-stage renal disease (CMS/HCC) 04/2017    DIALYSIS [facility] [place] MON-WED-FRI. 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    Hyperlipidemia 03/11/2013    Hypertension     CONTROLLED WITH MEDS.    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 CURLY.    Shortness  of breath     MODERATE EXERTION     Stage 5  chronic kidney disease on chronic dialysis (CMS/HCC) 08/21/2019    M,W,F - Dr. Lenward      Type 2 diabetes mellitus with diabetic chronic kidney disease (CMS/HCC) 04/24/2017    Type 2 diabetes mellitus, controlled (CMS/HCC)     AM FSBS 160-170. INSTRUCTED TO CONTROL DIET CLOSELY TONIGHT.  HAD BEEN OFF JANUVIA , RESTARTED 06/15/17. HA1C 9 06/15/17. MANAGED BY PCP.     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) 06/19/2023   [3]   Past Surgical History:  Procedure Laterality Date    BRONCHOSCOPY, FIBEROPTIC N/A 06/21/2017    Procedure: BRONCHOSCOPY with brushing and wash;  Surgeon: Fernand Arlyn Lazier, MD;  Location: [facility];  Service: Pulmonary;  Laterality: N/A;    BRONCHOSCOPY, FIBEROPTIC N/A 05/22/2019    Procedure: BRONCHOSCOPY w/ wash & brush;  Surgeon: Fernand Arlyn DEL, MD;  Location: [facility];  Service: Pulmonary;  Laterality: N/A;    CORONARY ARTERY BYPASS N/A 06/12/2016    Procedure: Coronary Artery Bypass x 4.;  Surgeon: Clent Camellia CROME, MD;  Location: [facility];  Service: Cardiothoracic;  Laterality: N/A;  LIMA to LDA  Vein to PDA  Vein to OM  Vein to Diagonal    EGD, COLONOSCOPY N/A 10/04/2016    EGD, COLONOSCOPY N/A 10/04/2016    Procedure: EGD w/ bx, COLONOSCOPY w/ bx, polypectomy;  Surgeon: Theador Colin RAMAN, MD;  Location: [place] [facility];  Service: Gastroenterology;  Laterality: N/A;    ENDOSCOPIC,VEIN HARVEST N/A 06/12/2016    Procedure: Endoscopic, Left Leg Greater Saphenous Vein Harvest;  Surgeon: Clent Camellia CROME, MD;  Location: [facility];  Service: Cardiothoracic;  Laterality: N/A;  Left Leg Incision (medially, groin to ankle) @ 0742  Vein out of leg @ 0826  Vein prep complete @ 0845  Total time = 63 minutes    FORMATION, ARTERIOVENOUS FISTULA, UPPER EXTREMITY Left 05/31/2017    Procedure: FORMATION, UPPER EXTREMITY, A-V FISTULA;  Surgeon: Aurelio Arvis LABOR, MD;  Location: [place] [facility];  Service: Vascular;  Laterality: Left;  LEFT ARM A-V FISTULA       REVISION, A-V FISTULA UPPER EXTREMITY Left 07/16/2017    Procedure: REVISION, A-V FISTULA UPPER EXTREMITY WITH PATCH ANGIOPLASTY;  Surgeon: Aurelio Arvis LABOR, MD;  Location: [place] [facility];  Service: Vascular;  Laterality: Left;

## 2024-10-05 NOTE — Nursing Progress Note (Signed)
 RN Shift Note and Trio Rounds template  Date Time: 10/05/24 4:49 PM  Patient Name: Jacqueline Weber, Jacqueline Weber  Room No: F757/F757.A  Admit Date: 10/04/2024  Length of Stay: 1  Attending Physician: Gaynelle Coy, MD   Code Status:Full Code  Admit Status: Inpatient  Shift Note:  Shift Events/ Updates: Pt is Aox3. Denies any pain, n/v.  Out of bed, ambulated to the bathroom with walker and standby assist. Participated PT/OT.  On  RA now. O2 sat >95%.  Per Dr. Gaynelle order, Stroke protocol discontinued. Passed TOR-BSST and diet resumed.   Plan:    -IV antibiotic.    -Dialysis  -Chest xray completed.         Trio Rounds Template:   10/05/24    Shift Events/ Updates: (Call to Physician, RRT, new symptoms, Changes to exam per RN)  Overnight Events: No overnight events     Orientation:Change in Mental Status and ADL's    Negative    Assessment:     Vital Signs: (abnormal and pain is the 5th vital sign)      Pain/VS: 0-No pain    Weight: (Trending, I&O's, Net, diuresing)                                       Respiratory: (increase or decreased O2 demands, O2 needed at home)      Cardiac Rate & Rhythm     Telemetry necessity (Short-Term/ Long-Term) TELEMETRY MONITORING - LONG TERM    GI: Bowel concerns    GU: Bladder/urine concerns  Does Patient have Foley Catheter (anticipated removal date)        Diet:      Most Recent diet: Adult diet Therapeutic/ Modified; Solid; Easy to chew (IDDSI level 7); Thin (IDDSI level 0); Consistent carbohydrate, Renal  Previous diet: Adult diet Therapeutic/ Modified; Solid; Regular (IDDSI level 7); Thin (IDDSI level 0); Consistent carbohydrate and heart healthy, Renal    Patient has abnormal labs (K, Mg, hb, Cr, LFT's, Cultures resulted in last 24 hours)       Patient had Imaging resulted in 24 hours   XR Chest AP Portable  Result Date: 10/05/2024  Stable bibasilar atelectasis and patchy airspace opacity left greater than right. Lamarr Bellini, MD 10/05/2024 11:08 AM    MRI brain without contrast  Result  Date: 10/05/2024  1.No evidence of acute intracranial process. 2.Mild/moderate chronic microangiopathic changes with small chronic cortical infarcts in the left parietal and frontal lobes. 3.Extensive mucosal thickening throughout the paranasal sinuses and fluid opacification of bilateral mastoid air cells; correlate for infection. Justus Radford, MD 10/05/2024 5:40 AM    CT Chest Abdomen Pelvis WO Contrast  Result Date: 10/04/2024  1.Multifocal pneumonia with endobronchial debris, likely aspiration. 2.No acute abnormality in the abdomen or pelvis. 3.Nodular liver contours suggesting fibrosis or cirrhosis. 4.Chronic appearing multilevel compression deformities throughout the thoracolumbar spine. Burnadette Door, MD 10/04/2024 10:34 PM    CTA Head & Neck  Result Date: 10/04/2024  1.There is 50-60% stenosis of the proximal right internal carotid artery based on NASCET criteria. 2.There is 10-15% stenosis of the proximal left internal carotid artery based on NASCET criteria. 3.The vertebrobasilar arterial system is patent. 4.There is no proximal intracranial large vessel occlusion. Kushal Y. Albina, MD 10/04/2024 7:00 PM    Chest  AP Portable  Result Date: 10/04/2024   Minimal basilar lung opacities are unchanged compared to previous exam and are  probably atelectasis. Deward PARAS. Stacia, MD 10/04/2024 5:45 PM    CT Head WO Contrast  Result Date: 10/04/2024  1. No acute intracranial hemorrhage is detected. 2. There is moderate subcortical, deep, and periventricular leukomalacia in both cerebral hemispheres.  This is suggestive of chronic small vessel ischemic change. Patrick J. Oliverio, MD 10/04/2024 4:58 PM      Antibiotics: CEFEPIME  2 G IVP (+DILUENT )  VANCOMYCIN  HCL IN NACL 1.5-0.9 GM/500ML-% IV SOLN  CEFEPIME  1 GM MBP  VANCOMYCIN  PHARMACY TO DOSE PLACEHOLDER  CEFTRIAXONE  1 GM MBP  AZITHROMYCIN  500 MG IN 250 ML NS VIAL2BAG (20 MM BLUE ADAPTER)    Anticoagulants: Yes    Mobility:     PT/OT, sitters            Yes     Psychosocial:            Psychosocial:     Patient/family have questions on plan of care/ discharge plans  No concerns/questions on plan of care     Medication history updated     Dispense report 100 days   Unit Protocols:     Patient has [employer]  (Anticipated removal date)                                          Fall risk                                                                         Patient has Pressure Ulcer or at Risk                           Glucose less than 100 and on insulin                           Readmission in last 30 days        Patient transferred last night from another unit                  4:49 PM   10/05/24

## 2024-10-05 NOTE — Progress Notes (Signed)
 TOR-BSST: The [place] Bedside Swallowing Screening Test      Time of Evaluation: ___915_______    A) Is the patient alert and able to participate? If NO mark results (D) as failed.     Yes or No Yes    B) Before water  intake:      1.  Have patient say ah and judge voice quality    Abnormal or Normal: Normal  2.  Ask patient to stick tongue out and then move it from side to side.    Abnormal or Normal: Normal      C)  Water  intake:    Have the patient sit upright and give water .  Ask patient to say ah after each intake.  Mark as abnormal if you note any of the following signs: coughing, change in voice quality or drooling.  If abnormal, stop water  intake and advance to D.     1.  Give one teaspoon of water  at a time.  Stop immediately for any abnormal signs as listed above.  If swallowing normally, repeat for a total of 10 teaspoons.    Abnormalities noted:  No      If yes, which swallow?     If yes, what was the abnormality?       If no, continue the test.    2. Cup Drinking: If all 10 teaspoons were swallowed normally.  Allow the patient to drink from the cup.  Wait 1 minute after the end of the water  swallows.  Ask patient to say ah and judge voice quality.    Abnormal or Normal: normal    D)  Results:  No abnormal signs (pass) or 1 or more abnormal signs (fail) Pass        *If patient passes please remember to enter SLP eval and treat order and IDDISI 7 easy to chew diet order*

## 2024-10-06 ENCOUNTER — Other Ambulatory Visit: Payer: Self-pay

## 2024-10-06 DIAGNOSIS — I639 Cerebral infarction, unspecified: Secondary | ICD-10-CM

## 2024-10-06 LAB — CHEM 8 POCT
Anion Gap POCT: 18 meq/L — ABNORMAL HIGH (ref 5–15)
BUN POCT: 13 mg/dL (ref 8–26)
CO2 POCT: 32 meq/L (ref 23–33)
Calcium Ionized: 2 meq/L — ABNORMAL LOW (ref 2.44–2.64)
Chloride POCT: 92 meq/L — ABNORMAL LOW (ref 98–109)
Creatinine POCT: 3.1 mg/dL — ABNORMAL HIGH (ref 0.6–1.1)
GFR: 15.4 mL/min/1.73 m2 — ABNORMAL LOW (ref >=60.0)
Glucose POCT: 193 mg/dL — ABNORMAL HIGH (ref 70–100)
Hematocrit: 37 % (ref 34.7–43.7)
Hemoglobin: 12.6 g/dL (ref 12.0–15.6)
Potassium: 4 meq/L (ref 3.5–4.9)
Sodium: 137 meq/L (ref 136–146)

## 2024-10-06 LAB — CBC
Absolute nRBC: 0 10*3/uL (ref <=0.00)
Hematocrit: 26.2 % — ABNORMAL LOW (ref 34.7–43.7)
Hemoglobin: 8.2 g/dL — ABNORMAL LOW (ref 11.4–14.8)
MCH: 21.6 pg — ABNORMAL LOW (ref 25.1–33.5)
MCHC: 31.3 g/dL — ABNORMAL LOW (ref 31.5–35.8)
MCV: 69.1 fL — ABNORMAL LOW (ref 78.0–96.0)
Platelet Count: 159 10*3/uL (ref 142–346)
RBC: 3.79 10*6/uL — ABNORMAL LOW (ref 3.90–5.10)
RDW: 19 % — ABNORMAL HIGH (ref 11–15)
WBC: 19.48 10*3/uL — ABNORMAL HIGH (ref 3.10–9.50)
nRBC %: 0 /100{WBCs} (ref <=0.0)

## 2024-10-06 LAB — RESTRICTED TEST**RESPIRATORY PATHOGEN PANEL WITH COVID-19, PCR
Adenovirus DNA: NOT DETECTED
Bordetella parapertussis DNA: NOT DETECTED
Bordetella pertussis DNA: NOT DETECTED
Chlamydophila pneumoniae DNA: NOT DETECTED
Coronavirus 229E RNA: NOT DETECTED
Coronavirus HKU1 RNA: NOT DETECTED
Coronavirus NL63 RNA: NOT DETECTED
Coronavirus OC43 RNA: NOT DETECTED
Human Metapneumovirus RNA: NOT DETECTED
Human Rhinovirus/Enterovirus RNA: NOT DETECTED
Influenza A RNA: NOT DETECTED
Influenza B RNA: NOT DETECTED
Mycoplasma pneumoniae DNA: NOT DETECTED
Parainfluenza Virus 1 RNA: NOT DETECTED
Parainfluenza Virus 2 RNA: NOT DETECTED
Parainfluenza Virus 3 RNA: NOT DETECTED
Parainfluenza Virus 4 RNA: NOT DETECTED
Respiratory Syncytial Virus RNA: NOT DETECTED
SARS-CoV-2 (COVID-19) RNA: NOT DETECTED

## 2024-10-06 LAB — RENAL FUNCTION PANEL
Albumin: 3.1 g/dL — ABNORMAL LOW (ref 3.5–4.9)
Anion Gap: 16 — ABNORMAL HIGH (ref 5.0–15.0)
BUN: 50 mg/dL — ABNORMAL HIGH (ref 7–21)
CO2: 24 meq/L (ref 17–29)
Calcium: 8.6 mg/dL (ref 7.9–10.2)
Chloride: 94 meq/L — ABNORMAL LOW (ref 99–111)
Creatinine: 6.1 mg/dL — ABNORMAL HIGH (ref 0.4–1.0)
GFR: 6.8 mL/min/1.73 m2 — ABNORMAL LOW (ref >=60.0)
Glucose: 308 mg/dL — ABNORMAL HIGH (ref 70–100)
Phosphorus: 5 mg/dL — ABNORMAL HIGH (ref 2.3–4.7)
Potassium: 4.6 meq/L (ref 3.5–5.3)
Sodium: 134 meq/L — ABNORMAL LOW (ref 135–145)

## 2024-10-06 LAB — ECG 12-LEAD
Atrial Rate: 89 {beats}/min
IHS MUSE NARRATIVE AND IMPRESSION: NORMAL
P Axis: 54 degrees
P-R Interval: 176 ms
Q-T Interval: 374 ms
QRS Duration: 88 ms
QTC Calculation (Bezet): 455 ms
R Axis: 41 degrees
T Axis: 2 degrees
Ventricular Rate: 89 {beats}/min

## 2024-10-06 LAB — HEPATITIS B (HBV) SURFACE ANTIGEN WITH REFLEX TO CONFIRMATION: Hepatitis B Surface Antigen: NONREACTIVE

## 2024-10-06 LAB — WHOLE BLOOD GLUCOSE POCT
Whole Blood Glucose POCT: 340 mg/dL — ABNORMAL HIGH (ref 70–100)
Whole Blood Glucose POCT: 357 mg/dL — ABNORMAL HIGH (ref 70–100)
Whole Blood Glucose POCT: 189 mg/dL — ABNORMAL HIGH (ref 70–100)

## 2024-10-06 LAB — HEPATITIS B SURFACE (HBV) ANTIBODY, QUANTITATIVE: Hepatitis B Surface Antibody: 42.96 m[IU]/mL

## 2024-10-06 MED ORDER — SODIUM CHLORIDE 0.9 % IV BOLUS
250.0000 mL | INTRAVENOUS | Status: AC | PRN
  Filled 2024-10-06: qty 250

## 2024-10-06 MED ORDER — DOXYCYCLINE MONOHYDRATE 100 MG PO CAPS
100.0000 mg | ORAL_CAPSULE | Freq: Two times a day (BID) | ORAL | 0 refills | Status: AC
  Filled 2024-10-06: qty 8, 4d supply, fill #0

## 2024-10-06 MED ORDER — LEVOFLOXACIN 500 MG PO TABS
500.0000 mg | ORAL_TABLET | ORAL | 0 refills | Status: AC
  Filled 2024-10-06: qty 4, 8d supply, fill #0

## 2024-10-06 MED ORDER — INSULIN GLARGINE 100 UNIT/ML SC SOLN: 10.0000 [IU] | Freq: Every evening | SUBCUTANEOUS | Status: DC

## 2024-10-06 MED ORDER — NEPHRO-VITE 0.8 MG PO TABS
1.0000 | ORAL_TABLET | Freq: Every day | ORAL | Status: DC
  Administered 2024-10-06: 1 via ORAL
  Filled 2024-10-06: qty 1

## 2024-10-06 MED ORDER — INSULIN GLARGINE 100 UNIT/ML SC SOLN
10.0000 [IU] | Freq: Every evening | SUBCUTANEOUS | Status: DC
  Administered 2024-10-06: 10 [IU] via SUBCUTANEOUS
  Filled 2024-10-06: qty 10

## 2024-10-06 MED ORDER — SODIUM CHLORIDE 0.9 % IV BOLUS
100.0000 mL | INTRAVENOUS | Status: AC | PRN
  Filled 2024-10-06: qty 150

## 2024-10-06 NOTE — Progress Notes (Signed)
 Pt on bed, AAOx3, on RA, denies SOB/pain. Time out and safety checks done. Left upper arm AVF, cannulated with #15 without difficulty;+B/T. Standard bath used. Pt ok to start txt.   10/06/24 1426   Bedside Nurse Communication   Name of bedside RN - pre dialysis Bekele, Nardos   Treatment Initiation- With Dialysis Precautions   Time Out/Safety Check Completed Yes   Consent for HD signed for this hospitalization (Date) 10/06/24   Consent for HD signed for this hospitalization (Time) 1422   Preferred language No   Blood Consent Verified N/A   Dialysis Precautions All Connections Secured;Saline Line Double Clamped;Venous Parameters Set;Arterial Parameters Set;Air Foam Detecctor Engaged   Dialysis Treatment Type Routine;Acute Room   Is patient diabetic? Yes   RO/Hemodialysis Cabin Crew   Is Total Chlorine less than 0.1 ppm? Yes   Orignial Total Chlorine Testing Time 1430   At 4 Hour Total Chlorine Testing Time 1830   RO/Hemodialysis Arts Development Officer Number 4    Machine Serial Number [id number]   RO # 44   RO Serial # [id number]   Water  Hardness NA   pH 7.2   Pressure Test Verified Yes   Alarms Verified Passed   Machine Temperature 98.6 F (37 C)   Alarms Verified Yes   Na+ mEq (Machine) 138 mEq   Bicarb mEq (Machine) 35 mEq   Hemodialysis Conductivity (Machine) 13.7   Hemodialysis Conductivity (Meter) 13.8   Dialyzer Lot Number [id number] 07/05/27   Tubing Lot Number [id number] 04/03/26   RO Machine Log Completed Yes   Hepatitis Status   HBsAg (Antigen) Result Unknown   HBsAg Date Drawn 10/06/24  (pending)   Vitals   Temp 97.3 F (36.3 C)   Heart Rate 88   Resp Rate 18   BP 163/81   SpO2 99 %   O2 Device None (Room air)   Assessment   Mental Status Alert;Oriented;Cooperative   Cardiac (WDL) WDL   Cardiac Regularity Return to [facility]   Cardiac Symptoms Return to [facility]   Cardiac Rhythm Normal sinus rhythm   Respiratory  WDL   Bilateral Breath Sounds Rhonchi   Cough Weak;Spontaneous;Non-productive;Moist   Edema   X   Generalized Edema Non Pitting Edema   Facial Return to WDL   RLE Edema +1   LLE Edema +1   General Skin Color Appropriate for ethnicity   Skin Temp Dry;Warm   Gastrointestinal (WDL) X   Abdomen Inspection Rounded   GI Conditions Return to [facility]   Mobility Bed   Graft/Fistula Left   No placement date or time found.   Present on Admission?: Yes  Access Type: Arteriovenous Fistula  Orientation: Left  Graft/Fistula Location: Upper Arm   Fistula/ Graft Assessment Abnormalities WDL   Needle Size 15 Gauge   Pain Assessment   Charting Type Assessment   Pain Scale Used Numeric Scale (0-10)   Numeric Pain Scale   Pain Score 0   POSS Score 1   Hemodialysis Comments   Pre-Hemodialysis Comments Pt & orders verified. Time out and safety checks done.

## 2024-10-06 NOTE — Plan of Care (Signed)
 Problem: Renal Instability  Goal: Fluid and electrolyte balance are achieved/maintained  Flowsheets (Taken 10/06/2024 1445)  Fluid and electrolyte balance are achieved/maintained:   Monitor/assess lab values and report abnormal values   Monitor for muscle weakness   Follow fluid restrictions/IV/PO parameters   Assess and reassess fluid and electrolyte status   Observe for cardiac arrhythmias

## 2024-10-06 NOTE — Progress Notes (Signed)
 Discharging home w/ OP f/u, [facility] HD- [facility] notified and HH PT/OT being arranged by [facility].  ICCB Appt ordered.  Family will transport.  No other CM needs identified at this time.       10/06/24 1122   Outpatient Services   Home Health Home PT/OT/ST   CM Interventions   Follow up appointment scheduled?(For PNA, COPD, MI) No   Reason no follow up scheduled? Referred to [facility] clinic   Referral made for home health RN visit? Does not meet home bound criteria   Multidisciplinary rounds/family meeting before d/c? Yes

## 2024-10-06 NOTE — Progress Notes (Signed)
 Completed 2 hrs of txt, tolerated well. Net UF 1L as ordered. Gauze dressing to Left upper arm AVF. Hemostasis achieved. +B/T. Standard bath used. Pt stable/denies complaints. Report given to Primary RN.   10/06/24 1655   Treatment Summary   Time Off Machine 1640   Duration of Treatment (Hours) 2   Treatment Type 1:1   Dialyzer Clearance Lightly streaked   Fluid Volume Off (mL) 1500   Prime Volume (mL) 200   Rinseback Volume (mL) 300   Fluid Given: Normal Saline (mL) 0   Fluid Given: PRBC  0 mL   Fluid Given: Albumin  (mL) 0   Fluid Given: Other (mL) 0   Total Fluid Given 500   Hemodialysis Net Fluid Removed 1000   Post Treatment Assessment   Post-Treatment Weight (Kg) -1   Patient Response to Treatment tolerated well   Information for Next Treatment TBD   Additional Dialyzer Used 0   Graft/Fistula Left   No placement date or time found.   Present on Admission?: Yes  Access Type: Arteriovenous Fistula  Orientation: Left  Graft/Fistula Location: Upper Arm   Fistula/ Graft Assessment Abnormalities WDL   Needle Size 15 Gauge   Cannulation Sites held Arterial (min) 10   Cannulation Sites held Venous (min) 8   Hemostasis Achieved Yes   Vitals   Temp 97.5 F (36.4 C)   Heart Rate 89   Resp Rate 18   BP 165/77   SpO2 99 %   O2 Device None (Room air)   Assessment   Mental Status Alert;Oriented;Cooperative   Cardiac (WDL) WDL   Cardiac Regularity Return to [facility]   Cardiac Symptoms Return to [facility]   Cardiac Rhythm Normal sinus rhythm   Respiratory  WDL   Bilateral Breath Sounds Rhonchi   Cough Moist;Weak;Productive;Spontaneous   Edema  X   Generalized Edema Non Pitting Edema   Facial Return to WDL   RLE Edema +1   LLE Edema +1   General Skin Color Appropriate for ethnicity   Skin Temp Dry;Warm   Gastrointestinal (WDL) X   Abdomen Inspection Rounded   GI Conditions Return to [facility]   Mobility Bed   Pain Assessment   Charting Type Assessment   Pain Scale Used Numeric Scale (0-10)   Numeric Pain Scale   Pain Score 0   POSS Score 1    Education   Person taught Patient   Knowledge basis Substantial   Topics taught Fluid Management;Procedure   Teaching Tools Explain   Reponse Verbalizes Understanding   Bedside Nurse Communication   Name of bedside RN - post dialysis Bekele, Nardos

## 2024-10-06 NOTE — Progress Notes (Signed)
 [place]  [facility]  [address] [facility] [address]  [place] [address]  INFECTIOUS DISEASE PROGRESS NOTE    Date Time: 10/06/24 10:27 AM  Patient Name: Jacqueline Weber   RDW:[id number]:[id number], DOB:1951-01-11     Assessment:   Pt seen  and examined and case discussed with the primary team  All labx cx and radiology reports And previous medical records personally reviewed by me.    Time spent:  71  ( Min)    Jacqueline Weber is a 74 y.o. female who presents to the hospital on 10/04/2024 and admitted with:    Active  problem List:  Acute hypoxic respiratory failure and sepsis secondary to pneumonia     Multifocal bilateral pneumonia  -CT chest/abdomen/pelvis without contrast with evidence of multifocal pneumonia with endobronchial debris, likely aspiration.  Nodular liver contour suggesting fibrosis or cirrhosis.  Chronic appearing multilevel compression deformity throughout the thoracolumbar spine.  -Negative COVID-19 and influenza   Respiratory viral panel, mycoplasma negative     Acute bronchospasm  Leukocytosis-- Secondary to  sepsis, steroids     History of CVA with residual mild left paresis  End-stage renal disease dialysis on Tuesday Thursday Saturday  Acute toxic metabolic encephalopathy on presentation-now improved  Hypertension        Antimicrobials:  Cetriaxone, vancomycin   Doxycycline      Recommendations:  Allergies: Shellfish, lisinopril , penicillin, mosquito bite      Cont antibiotics with ceftriaxone , vancomycin  and doxycycline -- This can be transitioned to PO renally dose levaquin  500mg  every 48 hours for 10 days and doxycycline  for 7 days.  Side effects and directions for abx discussed in detail with son, verbalized understanding  Incentive spirometry  Steroids as per team  Dialysis as per nephrology  CBC BMP  Nebulizations  Follow up with ID in one week with repeat chest xray-- this was discussed with son     Discussed with pt / son at bedside and  Primary team     Thank you for allowing us   to participate in the care of this patient.     Subjective:     Seen in unit with son at bedside.  Reports of dry cough with occasional phlegm, denies sob, CP or difficulty breathing  ON RA   Plan for [place] this afternoon after dialysis    Other system review was performed and found to be negative except as stated above     Medications:   Current Facility-Administered Medications[1]            Current Facility-Administered Medications   Medication Dose Route Frequency    aspirin   81 mg Oral Daily     Or    aspirin   300 mg Rectal Daily    atorvastatin   10 mg Oral Daily    azithromycin   500 mg Intravenous Q24H    carvedilol   3.125 mg Oral Daily    cefTRIAXone   1 g Intravenous Q24H    doxycycline   100 mg Oral Q12H SCH    fluticasone   2 spray Each Nare Daily    fluticasone  furoate-vilanterol  1 puff Inhalation QAM    heparin  (porcine)  5,000 Units Subcutaneous Q12H [facility]    hydrALAZINE   25 mg Oral BID    insulin  lispro  1-3 Units Subcutaneous QHS    insulin  lispro  1-5 Units Subcutaneous TID AC    methylPREDNISolone   40 mg Intravenous Q8H    montelukast   10 mg Oral QHS    pantoprazole   40 mg Oral QAM AC  RisaQuad  1 capsule Oral Daily    sevelamer  carbonate  1,600 mg Oral TID MEALS    umeclidinium bromide   1 puff Inhalation QAM         Physical Exam:     Vitals:    10/06/24 0634 10/06/24 0644 10/06/24 0808 10/06/24 1009   BP: 186/79 171/73 170/74 173/78   Pulse: 90 88 93 (!) 105   Resp:   18    Temp:   97.5 F (36.4 C)    TempSrc:   Oral    SpO2:   94%    Weight:       Height:           Patient Lines/Drains/Airways Status       Active Lines, Drains and Airways       Name Placement date Placement time Site Days    Peripheral IV 10/04/24 18 G Standard Anterior;Proximal;Right Forearm 10/04/24  1658  Forearm  1    Graft/Fistula Left --  --  -- --                  General: Alert awake and oriented Elderly obese female on RA  HEENT: atraumatic normocephalic, moist mucus membranes, no pharyngeal erythema or exudates, no thrush  or other lesions noted,  NECK: supple, no rigidity noted, no JVD, no lymphadenopathy  RS:  decreased lung sounds, no wheezing or crackles noted  CVS:  S1 S2 normal  ABD: soft, non distended, non tender, no hepatosplenomegaly noted, normal active bowel sounds  GU: no CVA tenderness, no discharge noted, no external lesions  NEURO: alert awake oriented x3, no focal deficits noted, sensory and motor intact, no cranial nerve deficits noted  EXT: no edema, range of motion is good, no effusion noted  SKIN: no rash or other lesions or wounds noted   HEM/LYM: no lymphadenopathy, no ecchymosis or bruising  PSYCH: appropriate mood and affect    Labs Reviewed:     Labs:     Recent Labs     10/06/24  0429 10/05/24  0619   WBC 19.48* 17.60*   Hemoglobin 8.2* 7.9*   Hematocrit 26.2* 25.7*   Platelet Count 159 142   MCV 69.1* 70.2*       Recent Labs     10/06/24  0429 10/05/24  0619 10/04/24  1654   Sodium 134* 135 137   Potassium 4.6 4.1 4.1   Chloride 94* 96* 93*   CO2 24 28 29    BUN 50* 23* 13   Creatinine 6.1* 4.4* 2.9*   Glucose 308* 292* 183*   Calcium  8.6 7.9 9.5   Magnesium   --   --  1.7   Phosphorus 5.0* 3.7  --        Recent Labs     10/06/24  0429 10/05/24  0619 10/04/24  1654   AST (SGOT)  --   --  26   ALT  --   --  9   Alkaline Phosphatase  --   --  209*   Protein, Total  --   --  8.8*   Albumin  3.1* 2.8* 3.7   Bilirubin, Total  --   --  0.6                    Recent BLOOD CULTURE No results for input(s): CXBLD in the last 24 hours.  Recent URINE CULTURE Invalid input(s): CXURN       Rads:   Radiological Procedure reviewed.  XR Chest AP Portable  Result Date: 10/05/2024  Stable bibasilar atelectasis and patchy airspace opacity left greater than right. Jacqueline Bellini, MD 10/05/2024 11:08 AM    MRI brain without contrast  Result Date: 10/05/2024  1.No evidence of acute intracranial process. 2.Mild/moderate chronic microangiopathic changes with small chronic cortical infarcts in the left parietal and frontal lobes.  3.Extensive mucosal thickening throughout the paranasal sinuses and fluid opacification of bilateral mastoid air cells; correlate for infection. Justus Radford, MD 10/05/2024 5:40 AM    CT Chest Abdomen Pelvis WO Contrast  Result Date: 10/04/2024  1.Multifocal pneumonia with endobronchial debris, likely aspiration. 2.No acute abnormality in the abdomen or pelvis. 3.Nodular liver contours suggesting fibrosis or cirrhosis. 4.Chronic appearing multilevel compression deformities throughout the thoracolumbar spine. Burnadette Door, MD 10/04/2024 10:34 PM    CTA Head & Neck  Result Date: 10/04/2024  1.There is 50-60% stenosis of the proximal right internal carotid artery based on NASCET criteria. 2.There is 10-15% stenosis of the proximal left internal carotid artery based on NASCET criteria. 3.The vertebrobasilar arterial system is patent. 4.There is no proximal intracranial large vessel occlusion. Kushal Y. Albina, MD 10/04/2024 7:00 PM    Chest  AP Portable  Result Date: 10/04/2024   Minimal basilar lung opacities are unchanged compared to previous exam and are probably atelectasis. Deward PARAS. Stacia, MD 10/04/2024 5:45 PM    CT Head WO Contrast  Result Date: 10/04/2024  1. No acute intracranial hemorrhage is detected. 2. There is moderate subcortical, deep, and periventricular leukomalacia in both cerebral hemispheres.  This is suggestive of chronic small vessel ischemic change. Belvie DOROTHA Lack, MD 10/04/2024 4:58 PM        Signed by:     Shannon Rota, FNP   Infectious Diseases  Office- [phone]  Fax- [phone]    Attestation:  Evaluated  and reviewed all medical records, labs, radiology reports and notes as documented and agree with the documented findings and plan of care as written by  Shannon -NP    Dr.Nutankalva, MD  Consultant- Infectious Diseases  Office: [phone]  Fax: [phone]      *This note was generated by the Epic EMR system/ Dragon speech recognition and may contain inherent errors or omissions not  intended by the user. Grammatical errors, random word insertions, deletions, pronoun errors and incomplete sentences are occasional consequences of this technology due to software limitations. Not all errors are caught or corrected. If there are questions or concerns about the content of this note or information contained within the body of this dictation they should be addressed directly with the author for clarification                [1]   Current Facility-Administered Medications   Medication Dose Route Frequency    aspirin   81 mg Oral Daily    Or    aspirin   300 mg Rectal Daily    atorvastatin   10 mg Oral Daily    azithromycin   500 mg Intravenous Q24H    carvedilol   3.125 mg Oral Daily    cefTRIAXone   1 g Intravenous Q24H    doxycycline   100 mg Oral Q12H SCH    fluticasone   2 spray Each Nare Daily    fluticasone  furoate-vilanterol  1 puff Inhalation QAM    heparin  (porcine)  5,000 Units Subcutaneous Q12H [facility]    hydrALAZINE   25 mg Oral BID    insulin  lispro  1-3 Units Subcutaneous QHS    insulin  lispro  1-5 Units  Subcutaneous TID AC    methylPREDNISolone   40 mg Intravenous Q8H    montelukast   10 mg Oral QHS    pantoprazole   40 mg Oral QAM AC    RisaQuad  1 capsule Oral Daily    sevelamer  carbonate  1,600 mg Oral TID MEALS    umeclidinium bromide   1 puff Inhalation QAM

## 2024-10-06 NOTE — Consults (Signed)
 [facility] referral from hosp RN CC, Heather Milian- established HD pt TTS 12:15pm . Pt is planned to d/c home today after HD. [facility] confirmed for Tues 2/3.     Bridgette Hacker, HD Care Coordinator  [phone]

## 2024-10-08 ENCOUNTER — Encounter (INDEPENDENT_AMBULATORY_CARE_PROVIDER_SITE_OTHER): Payer: Self-pay

## 2024-10-08 LAB — MYCOPLASMA PNEUMONIAE ANTIBODIES, IGG/IGM
Mycoplasma pneumoniae Antibody, IgG: 4.49 — ABNORMAL HIGH (ref <=0.90)
Mycoplasma pneumoniae Antibody, IgM: 229 U/mL (ref <770)

## 2024-10-08 NOTE — Progress Notes (Signed)
 [facility] ([facility]viously [facility] [employer]):  Phone: [phone]   Fax: [phone]   Received a referral to help connect patient with follow-up care after leaving the hospital. Left patient a voicemail and provided main clinic phone number. Requested patient return call to schedule a follow up appointment as soon as possible.     Johnta Couts J. Coran Dipaola, LPN  Pronouns She/Her  Licensed Practical Nurse  [facility]  [address] [place], [address], [place], [place] [address]   T [phone] M-F  F [phone]   [email or web address]        [facility]

## 2024-10-08 NOTE — Progress Notes (Signed)
 [facility]             Phone: [phone]   Fax: [phone]      Telephone Screening   Outreach Reason Referral received to connect patient with follow-up care post-hospital discharge.   Spoke to  (ID Patient/Patient Emergency Contact) []  Patient   []  Other: __[name],Jacqueline Weber _____________   Relation to Patient []  Self  [x]  Other: _daughter ___________________     PRIMARY CARE PROVIDER      Does patient have an established PCP?   (Update PCP info in EPIC as needed) []  Yes: _____________________  []  Yes, but unable to see PCP for follow up  [x]  No PCP     COVERAGE INFO   Insured? [x]  Yes: Commercial Generic   []  No Coverage   Training And Development Officer Assistance? []  Not Started   []  In Progress   []  Approved   []  Denied  [x]  N/A   FOLLOW UP APPOINTMENT   Appointment scheduled at this time? [x]  Yes   []  No - Provided information; patient will contact to schedule.   []  No - Other, Reason: ______________________________   Clinic/Department [x]  [facility]   []  [facility]   []  [facility]   []  [facility]    []  [facility]   []  Other: ___________________________________________  []  N/A   Location / Contact Appointment [place] Wednesday 10/15/24 at 1:00 pm with Dr. Connell Ficks, MD. Address: [address], [place], [address], [place], [place], [address].  Telephone: [phone].           Appointment Date/Time    Appointment Provider    Additional Resources Provided? [x]  None  []  [employer]ator for [employer]   []  Transportation: ____________________________________  []  Other: ___________________________________________     Daxen Lanum J. Basma Buchner, LPN  Pronouns She/Her  Licensed Practical Nurse  [facility]  [address] [place], [address], [place], [place] [address]   T [phone] M-F  F [phone]   [email or web address]        [facility]

## 2024-10-09 LAB — CULTURE BLOOD AEROBIC AND ANAEROBIC
Culture Blood: NO GROWTH
Culture Blood: NO GROWTH

## 2024-10-15 ENCOUNTER — Ambulatory Visit (INDEPENDENT_AMBULATORY_CARE_PROVIDER_SITE_OTHER): Payer: PRIVATE HEALTH INSURANCE
